# Patient Record
Sex: Female | Born: 1970 | Race: White | Hispanic: No | Smoking: Current every day smoker
Health system: Southern US, Community
[De-identification: ages and names within clinical notes are randomized; demographics above are authoritative.]

## PROBLEM LIST (undated history)

## (undated) DIAGNOSIS — M545 Low back pain, unspecified: Secondary | ICD-10-CM

## (undated) DIAGNOSIS — R87629 Unspecified abnormal cytological findings in specimens from vagina: Secondary | ICD-10-CM

## (undated) DIAGNOSIS — F32A Depression, unspecified: Secondary | ICD-10-CM

## (undated) DIAGNOSIS — M1990 Unspecified osteoarthritis, unspecified site: Secondary | ICD-10-CM

## (undated) DIAGNOSIS — E538 Deficiency of other specified B group vitamins: Secondary | ICD-10-CM

## (undated) DIAGNOSIS — K219 Gastro-esophageal reflux disease without esophagitis: Secondary | ICD-10-CM

## (undated) DIAGNOSIS — N200 Calculus of kidney: Secondary | ICD-10-CM

## (undated) DIAGNOSIS — F319 Bipolar disorder, unspecified: Secondary | ICD-10-CM

## (undated) DIAGNOSIS — F329 Major depressive disorder, single episode, unspecified: Secondary | ICD-10-CM

## (undated) DIAGNOSIS — F419 Anxiety disorder, unspecified: Secondary | ICD-10-CM

## (undated) DIAGNOSIS — I10 Essential (primary) hypertension: Secondary | ICD-10-CM

## (undated) DIAGNOSIS — N750 Cyst of Bartholin's gland: Secondary | ICD-10-CM

## (undated) DIAGNOSIS — K610 Anal abscess: Secondary | ICD-10-CM

## (undated) DIAGNOSIS — K5090 Crohn's disease, unspecified, without complications: Secondary | ICD-10-CM

## (undated) DIAGNOSIS — R7309 Other abnormal glucose: Secondary | ICD-10-CM

## (undated) HISTORY — DX: Depression, unspecified: F32.A

## (undated) HISTORY — DX: Calculus of kidney: N20.0

## (undated) HISTORY — DX: Anxiety disorder, unspecified: F41.9

## (undated) HISTORY — DX: Gastro-esophageal reflux disease without esophagitis: K21.9

## (undated) HISTORY — DX: Unspecified abnormal cytological findings in specimens from vagina: R87.629

## (undated) HISTORY — DX: Anal abscess: K61.0

## (undated) HISTORY — DX: Bipolar disorder, unspecified: F31.9

## (undated) HISTORY — DX: Unspecified osteoarthritis, unspecified site: M19.90

## (undated) HISTORY — DX: Low back pain: M54.5

## (undated) HISTORY — DX: Low back pain, unspecified: M54.50

## (undated) HISTORY — DX: Deficiency of other specified B group vitamins: E53.8

## (undated) HISTORY — DX: Major depressive disorder, single episode, unspecified: F32.9

## (undated) HISTORY — DX: Cyst of Bartholin's gland: N75.0

## (undated) HISTORY — DX: Other abnormal glucose: R73.09

## (undated) HISTORY — PX: ELBOW SURGERY: SHX618

## (undated) HISTORY — PX: RECTOVAGINAL FISTULA CLOSURE: SUR265

## (undated) HISTORY — DX: Crohn's disease, unspecified, without complications: K50.90

---

## 1998-02-19 DIAGNOSIS — K5090 Crohn's disease, unspecified, without complications: Secondary | ICD-10-CM

## 1998-02-19 HISTORY — DX: Crohn's disease, unspecified, without complications: K50.90

## 1998-07-14 ENCOUNTER — Other Ambulatory Visit: Admission: RE | Admit: 1998-07-14 | Discharge: 1998-07-14 | Payer: Self-pay | Admitting: Gastroenterology

## 1998-07-14 ENCOUNTER — Encounter: Payer: Self-pay | Admitting: Gastroenterology

## 1999-07-01 ENCOUNTER — Emergency Department (HOSPITAL_COMMUNITY): Admission: EM | Admit: 1999-07-01 | Discharge: 1999-07-01 | Payer: Self-pay | Admitting: *Deleted

## 1999-07-02 ENCOUNTER — Encounter: Payer: Self-pay | Admitting: Emergency Medicine

## 2002-03-26 ENCOUNTER — Encounter: Admission: RE | Admit: 2002-03-26 | Discharge: 2002-05-13 | Payer: Self-pay | Admitting: Occupational Medicine

## 2003-02-22 ENCOUNTER — Encounter: Payer: Self-pay | Admitting: Internal Medicine

## 2003-02-22 LAB — CONVERTED CEMR LAB

## 2003-12-02 ENCOUNTER — Ambulatory Visit: Payer: Self-pay | Admitting: Physician Assistant

## 2003-12-29 ENCOUNTER — Encounter: Payer: Self-pay | Admitting: Unknown Physician Specialty

## 2004-01-03 ENCOUNTER — Ambulatory Visit: Payer: Self-pay | Admitting: Physician Assistant

## 2004-02-21 LAB — HM COLONOSCOPY

## 2004-03-01 ENCOUNTER — Ambulatory Visit: Payer: Self-pay | Admitting: Pain Medicine

## 2004-03-06 ENCOUNTER — Ambulatory Visit: Payer: Self-pay | Admitting: Pain Medicine

## 2004-03-14 ENCOUNTER — Ambulatory Visit: Payer: Self-pay | Admitting: Physician Assistant

## 2004-05-01 ENCOUNTER — Ambulatory Visit: Payer: Self-pay | Admitting: Physician Assistant

## 2004-05-09 ENCOUNTER — Ambulatory Visit: Payer: Self-pay | Admitting: Pain Medicine

## 2004-05-29 ENCOUNTER — Ambulatory Visit: Payer: Self-pay | Admitting: Physician Assistant

## 2004-06-19 ENCOUNTER — Ambulatory Visit: Payer: Self-pay | Admitting: Physician Assistant

## 2004-07-20 ENCOUNTER — Ambulatory Visit: Payer: Self-pay | Admitting: Physician Assistant

## 2005-01-22 ENCOUNTER — Ambulatory Visit: Payer: Self-pay | Admitting: Internal Medicine

## 2005-02-20 ENCOUNTER — Ambulatory Visit: Payer: Self-pay | Admitting: Internal Medicine

## 2005-04-05 ENCOUNTER — Ambulatory Visit: Payer: Self-pay | Admitting: Internal Medicine

## 2005-05-31 ENCOUNTER — Ambulatory Visit: Payer: Self-pay | Admitting: Internal Medicine

## 2005-07-06 ENCOUNTER — Ambulatory Visit: Payer: Self-pay | Admitting: Internal Medicine

## 2005-07-23 ENCOUNTER — Ambulatory Visit: Payer: Self-pay | Admitting: Internal Medicine

## 2005-09-24 ENCOUNTER — Ambulatory Visit: Payer: Self-pay | Admitting: Internal Medicine

## 2005-11-02 ENCOUNTER — Ambulatory Visit: Payer: Self-pay | Admitting: Internal Medicine

## 2005-11-26 ENCOUNTER — Ambulatory Visit: Payer: Self-pay | Admitting: Internal Medicine

## 2006-01-28 ENCOUNTER — Ambulatory Visit: Payer: Self-pay | Admitting: Internal Medicine

## 2006-02-19 DIAGNOSIS — N750 Cyst of Bartholin's gland: Secondary | ICD-10-CM

## 2006-02-19 HISTORY — DX: Cyst of Bartholin's gland: N75.0

## 2006-03-25 ENCOUNTER — Ambulatory Visit: Payer: Self-pay | Admitting: Internal Medicine

## 2006-07-24 ENCOUNTER — Ambulatory Visit: Payer: Self-pay | Admitting: Internal Medicine

## 2006-08-08 ENCOUNTER — Ambulatory Visit: Payer: Self-pay | Admitting: Internal Medicine

## 2006-08-08 LAB — CONVERTED CEMR LAB
Basophils Absolute: 0 10*3/uL (ref 0.0–0.1)
Basophils Relative: 0.4 % (ref 0.0–1.0)
Eosinophils Absolute: 0 10*3/uL (ref 0.0–0.6)
Eosinophils Relative: 0.5 % (ref 0.0–5.0)
HCT: 41 % (ref 36.0–46.0)
Hemoglobin: 14.1 g/dL (ref 12.0–15.0)
Lymphocytes Relative: 14.3 % (ref 12.0–46.0)
MCHC: 34.4 g/dL (ref 30.0–36.0)
MCV: 89.3 fL (ref 78.0–100.0)
Monocytes Absolute: 0.3 10*3/uL (ref 0.2–0.7)
Monocytes Relative: 3.5 % (ref 3.0–11.0)
Neutro Abs: 8 10*3/uL — ABNORMAL HIGH (ref 1.4–7.7)
Neutrophils Relative %: 81.3 % — ABNORMAL HIGH (ref 43.0–77.0)
Platelets: 205 10*3/uL (ref 150–400)
RBC: 4.59 M/uL (ref 3.87–5.11)
RDW: 12.5 % (ref 11.5–14.6)
Rh Type: POSITIVE
Vit D, 1,25-Dihydroxy: 37 (ref 20–57)
Vitamin B-12: 110 pg/mL — ABNORMAL LOW (ref 211–911)
WBC: 9.7 10*3/uL (ref 4.5–10.5)

## 2006-08-28 ENCOUNTER — Ambulatory Visit: Payer: Self-pay | Admitting: Internal Medicine

## 2006-09-23 ENCOUNTER — Ambulatory Visit: Payer: Self-pay | Admitting: Internal Medicine

## 2006-09-23 ENCOUNTER — Encounter: Payer: Self-pay | Admitting: Internal Medicine

## 2006-09-23 DIAGNOSIS — M545 Low back pain, unspecified: Secondary | ICD-10-CM | POA: Insufficient documentation

## 2006-11-13 ENCOUNTER — Ambulatory Visit (HOSPITAL_COMMUNITY): Admission: RE | Admit: 2006-11-13 | Discharge: 2006-11-13 | Payer: Self-pay | Admitting: Internal Medicine

## 2006-11-18 ENCOUNTER — Ambulatory Visit: Payer: Self-pay | Admitting: Internal Medicine

## 2006-11-18 LAB — CONVERTED CEMR LAB
Rh Type: POSITIVE
Vitamin B-12: 523 pg/mL (ref 211–911)

## 2006-11-27 ENCOUNTER — Encounter: Payer: Self-pay | Admitting: Internal Medicine

## 2006-11-27 ENCOUNTER — Ambulatory Visit: Payer: Self-pay | Admitting: Internal Medicine

## 2006-11-27 DIAGNOSIS — K219 Gastro-esophageal reflux disease without esophagitis: Secondary | ICD-10-CM | POA: Insufficient documentation

## 2006-11-27 DIAGNOSIS — R599 Enlarged lymph nodes, unspecified: Secondary | ICD-10-CM | POA: Insufficient documentation

## 2006-11-27 DIAGNOSIS — F419 Anxiety disorder, unspecified: Secondary | ICD-10-CM | POA: Insufficient documentation

## 2006-12-11 ENCOUNTER — Telehealth (INDEPENDENT_AMBULATORY_CARE_PROVIDER_SITE_OTHER): Payer: Self-pay | Admitting: *Deleted

## 2006-12-19 ENCOUNTER — Encounter: Payer: Self-pay | Admitting: Internal Medicine

## 2006-12-19 ENCOUNTER — Ambulatory Visit: Payer: Self-pay | Admitting: Internal Medicine

## 2006-12-19 ENCOUNTER — Telehealth: Payer: Self-pay | Admitting: Internal Medicine

## 2006-12-23 LAB — CONVERTED CEMR LAB
Basophils Absolute: 0.3 10*3/uL — ABNORMAL HIGH (ref 0.0–0.1)
Basophils Relative: 3.2 % — ABNORMAL HIGH (ref 0.0–1.0)
Eosinophils Absolute: 0.1 10*3/uL (ref 0.0–0.6)
Eosinophils Relative: 0.6 % (ref 0.0–5.0)
HCT: 39.3 % (ref 36.0–46.0)
Hemoglobin: 13.8 g/dL (ref 12.0–15.0)
Lymphocytes Relative: 20.9 % (ref 12.0–46.0)
MCHC: 35.1 g/dL (ref 30.0–36.0)
MCV: 86.5 fL (ref 78.0–100.0)
Monocytes Absolute: 0.5 10*3/uL (ref 0.2–0.7)
Monocytes Relative: 5.2 % (ref 3.0–11.0)
Neutro Abs: 6.2 10*3/uL (ref 1.4–7.7)
Neutrophils Relative %: 70.1 % (ref 43.0–77.0)
Platelets: 185 10*3/uL (ref 150–400)
RBC: 4.54 M/uL (ref 3.87–5.11)
RDW: 12.7 % (ref 11.5–14.6)
WBC: 9 10*3/uL (ref 4.5–10.5)

## 2006-12-24 ENCOUNTER — Ambulatory Visit: Payer: Self-pay | Admitting: Gastroenterology

## 2007-01-21 ENCOUNTER — Ambulatory Visit: Payer: Self-pay | Admitting: Internal Medicine

## 2007-01-21 DIAGNOSIS — K130 Diseases of lips: Secondary | ICD-10-CM | POA: Insufficient documentation

## 2007-01-28 ENCOUNTER — Ambulatory Visit (HOSPITAL_COMMUNITY): Admission: RE | Admit: 2007-01-28 | Discharge: 2007-01-28 | Payer: Self-pay | Admitting: Gastroenterology

## 2007-01-31 ENCOUNTER — Encounter: Payer: Self-pay | Admitting: Internal Medicine

## 2007-02-04 ENCOUNTER — Ambulatory Visit: Payer: Self-pay | Admitting: Gastroenterology

## 2007-02-20 DIAGNOSIS — K610 Anal abscess: Secondary | ICD-10-CM

## 2007-02-20 HISTORY — DX: Anal abscess: K61.0

## 2007-02-27 ENCOUNTER — Encounter: Payer: Self-pay | Admitting: Internal Medicine

## 2007-03-17 ENCOUNTER — Ambulatory Visit: Payer: Self-pay | Admitting: Internal Medicine

## 2007-03-17 DIAGNOSIS — E785 Hyperlipidemia, unspecified: Secondary | ICD-10-CM | POA: Insufficient documentation

## 2007-03-17 DIAGNOSIS — K612 Anorectal abscess: Secondary | ICD-10-CM | POA: Insufficient documentation

## 2007-03-17 DIAGNOSIS — E039 Hypothyroidism, unspecified: Secondary | ICD-10-CM | POA: Insufficient documentation

## 2007-03-19 ENCOUNTER — Encounter: Payer: Self-pay | Admitting: Internal Medicine

## 2007-03-19 LAB — CONVERTED CEMR LAB
ALT: 8 units/L (ref 0–35)
AST: 9 units/L (ref 0–37)
Albumin: 3.4 g/dL — ABNORMAL LOW (ref 3.5–5.2)
Alkaline Phosphatase: 53 units/L (ref 39–117)
BUN: 13 mg/dL (ref 6–23)
Basophils Absolute: 0 10*3/uL (ref 0.0–0.1)
Basophils Relative: 0.1 % (ref 0.0–1.0)
Bilirubin Urine: NEGATIVE
Bilirubin, Direct: 0.1 mg/dL (ref 0.0–0.3)
CO2: 28 meq/L (ref 19–32)
Calcium: 8.5 mg/dL (ref 8.4–10.5)
Chloride: 106 meq/L (ref 96–112)
Cholesterol: 168 mg/dL (ref 0–200)
Creatinine, Ser: 0.8 mg/dL (ref 0.4–1.2)
Crystals: NEGATIVE
Eosinophils Absolute: 0.1 10*3/uL (ref 0.0–0.6)
Eosinophils Relative: 1.1 % (ref 0.0–5.0)
GFR calc Af Amer: 104 mL/min
GFR calc non Af Amer: 86 mL/min
Glucose, Bld: 92 mg/dL (ref 70–99)
HCT: 36.7 % (ref 36.0–46.0)
HDL: 23.3 mg/dL — ABNORMAL LOW (ref >39.0)
Hemoglobin: 12.3 g/dL (ref 12.0–15.0)
LDL Cholesterol: 115 mg/dL — ABNORMAL HIGH (ref 0–99)
Leukocytes, UA: NEGATIVE
Lymphocytes Relative: 20 % (ref 12.0–46.0)
MCHC: 33.6 g/dL (ref 30.0–36.0)
MCV: 85.7 fL (ref 78.0–100.0)
Monocytes Absolute: 0.4 10*3/uL (ref 0.2–0.7)
Monocytes Relative: 5.6 % (ref 3.0–11.0)
Mucus, UA: NEGATIVE
Neutro Abs: 5 10*3/uL (ref 1.4–7.7)
Neutrophils Relative %: 73.2 % (ref 43.0–77.0)
Nitrite: NEGATIVE
Platelets: 167 10*3/uL (ref 150–400)
Potassium: 3.1 meq/L — ABNORMAL LOW (ref 3.5–5.1)
RBC: 4.28 M/uL (ref 3.87–5.11)
RDW: 12.5 % (ref 11.5–14.6)
Sodium: 141 meq/L (ref 135–145)
Specific Gravity, Urine: >=1.03 (ref 1.000–1.03)
TSH: 1.33 microintl units/mL (ref 0.35–5.50)
Total Bilirubin: 0.5 mg/dL (ref 0.3–1.2)
Total CHOL/HDL Ratio: 7.2
Total Protein: 6.2 g/dL (ref 6.0–8.3)
Triglycerides: 151 mg/dL — ABNORMAL HIGH (ref 0–149)
Urine Glucose: NEGATIVE mg/dL
Urobilinogen, UA: 0.2 (ref 0.0–1.0)
VLDL: 30 mg/dL (ref 0–40)
Vit D, 1,25-Dihydroxy: 28 — ABNORMAL LOW (ref 30–89)
Vitamin B-12: 425 pg/mL (ref 211–911)
WBC: 6.9 10*3/uL (ref 4.5–10.5)
pH: 6 (ref 5.0–8.0)

## 2007-03-27 ENCOUNTER — Ambulatory Visit: Payer: Self-pay | Admitting: Internal Medicine

## 2007-03-28 ENCOUNTER — Encounter: Payer: Self-pay | Admitting: Internal Medicine

## 2007-04-03 ENCOUNTER — Other Ambulatory Visit: Admission: RE | Admit: 2007-04-03 | Discharge: 2007-04-03 | Payer: Self-pay | Admitting: Obstetrics & Gynecology

## 2007-04-14 DIAGNOSIS — N1330 Unspecified hydronephrosis: Secondary | ICD-10-CM | POA: Insufficient documentation

## 2007-04-14 DIAGNOSIS — F17200 Nicotine dependence, unspecified, uncomplicated: Secondary | ICD-10-CM | POA: Insufficient documentation

## 2007-04-14 DIAGNOSIS — K5090 Crohn's disease, unspecified, without complications: Secondary | ICD-10-CM | POA: Insufficient documentation

## 2007-04-14 DIAGNOSIS — E538 Deficiency of other specified B group vitamins: Secondary | ICD-10-CM | POA: Insufficient documentation

## 2007-04-14 DIAGNOSIS — N200 Calculus of kidney: Secondary | ICD-10-CM | POA: Insufficient documentation

## 2007-04-14 DIAGNOSIS — K449 Diaphragmatic hernia without obstruction or gangrene: Secondary | ICD-10-CM | POA: Insufficient documentation

## 2007-04-14 DIAGNOSIS — K2990 Gastroduodenitis, unspecified, without bleeding: Secondary | ICD-10-CM

## 2007-04-14 DIAGNOSIS — F329 Major depressive disorder, single episode, unspecified: Secondary | ICD-10-CM | POA: Insufficient documentation

## 2007-04-14 DIAGNOSIS — G5600 Carpal tunnel syndrome, unspecified upper limb: Secondary | ICD-10-CM | POA: Insufficient documentation

## 2007-04-14 DIAGNOSIS — G43909 Migraine, unspecified, not intractable, without status migrainosus: Secondary | ICD-10-CM | POA: Insufficient documentation

## 2007-04-14 DIAGNOSIS — D126 Benign neoplasm of colon, unspecified: Secondary | ICD-10-CM | POA: Insufficient documentation

## 2007-04-14 DIAGNOSIS — M129 Arthropathy, unspecified: Secondary | ICD-10-CM | POA: Insufficient documentation

## 2007-04-14 DIAGNOSIS — F319 Bipolar disorder, unspecified: Secondary | ICD-10-CM

## 2007-04-14 DIAGNOSIS — R5381 Other malaise: Secondary | ICD-10-CM

## 2007-04-14 DIAGNOSIS — M2550 Pain in unspecified joint: Secondary | ICD-10-CM | POA: Insufficient documentation

## 2007-04-14 DIAGNOSIS — J40 Bronchitis, not specified as acute or chronic: Secondary | ICD-10-CM | POA: Insufficient documentation

## 2007-04-14 DIAGNOSIS — R5382 Chronic fatigue, unspecified: Secondary | ICD-10-CM | POA: Insufficient documentation

## 2007-04-14 DIAGNOSIS — K2210 Ulcer of esophagus without bleeding: Secondary | ICD-10-CM | POA: Insufficient documentation

## 2007-04-14 DIAGNOSIS — R5383 Other fatigue: Secondary | ICD-10-CM

## 2007-04-14 DIAGNOSIS — F909 Attention-deficit hyperactivity disorder, unspecified type: Secondary | ICD-10-CM | POA: Insufficient documentation

## 2007-04-14 DIAGNOSIS — F32A Depression, unspecified: Secondary | ICD-10-CM | POA: Insufficient documentation

## 2007-04-14 DIAGNOSIS — K2970 Gastritis, unspecified, without bleeding: Secondary | ICD-10-CM | POA: Insufficient documentation

## 2007-04-14 HISTORY — DX: Bipolar disorder, unspecified: F31.9

## 2007-04-15 ENCOUNTER — Encounter: Payer: Self-pay | Admitting: Gastroenterology

## 2007-04-18 ENCOUNTER — Encounter: Payer: Self-pay | Admitting: Internal Medicine

## 2007-05-02 ENCOUNTER — Ambulatory Visit: Payer: Self-pay | Admitting: Internal Medicine

## 2007-05-05 LAB — CONVERTED CEMR LAB
ALT: 10 units/L (ref 0–35)
AST: 13 units/L (ref 0–37)
Albumin: 3.8 g/dL (ref 3.5–5.2)
Alkaline Phosphatase: 88 units/L (ref 39–117)
BUN: 13 mg/dL (ref 6–23)
Basophils Absolute: 0.1 10*3/uL (ref 0.0–0.1)
Basophils Relative: 0.5 % (ref 0.0–1.0)
Bilirubin Urine: NEGATIVE
Bilirubin, Direct: 0.1 mg/dL (ref 0.0–0.3)
CO2: 28 meq/L (ref 19–32)
Calcium: 9.4 mg/dL (ref 8.4–10.5)
Chloride: 107 meq/L (ref 96–112)
Creatinine, Ser: 0.9 mg/dL (ref 0.4–1.2)
Eosinophils Absolute: 0.3 10*3/uL (ref 0.0–0.6)
Eosinophils Relative: 2.8 % (ref 0.0–5.0)
GFR calc Af Amer: 91 mL/min
GFR calc non Af Amer: 75 mL/min
Glucose, Bld: 102 mg/dL — ABNORMAL HIGH (ref 70–99)
HCT: 38.2 % (ref 36.0–46.0)
Hemoglobin: 13.1 g/dL (ref 12.0–15.0)
Ketones, ur: NEGATIVE mg/dL
Lymphocytes Relative: 19.7 % (ref 12.0–46.0)
MCHC: 34.2 g/dL (ref 30.0–36.0)
MCV: 83.5 fL (ref 78.0–100.0)
Monocytes Absolute: 0.6 10*3/uL (ref 0.2–0.7)
Monocytes Relative: 6 % (ref 3.0–11.0)
Neutro Abs: 7.4 10*3/uL (ref 1.4–7.7)
Neutrophils Relative %: 71 % (ref 43.0–77.0)
Nitrite: NEGATIVE
Platelets: 220 10*3/uL (ref 150–400)
Potassium: 3.5 meq/L (ref 3.5–5.1)
RBC / HPF: NONE SEEN
RBC: 4.58 M/uL (ref 3.87–5.11)
RDW: 12.5 % (ref 11.5–14.6)
Sodium: 141 meq/L (ref 135–145)
Specific Gravity, Urine: >=1.03 (ref 1.000–1.03)
Total Bilirubin: 0.4 mg/dL (ref 0.3–1.2)
Total Protein, Urine: NEGATIVE mg/dL
Total Protein: 6.7 g/dL (ref 6.0–8.3)
Urine Glucose: NEGATIVE mg/dL
Urobilinogen, UA: 0.2 (ref 0.0–1.0)
WBC: 10.4 10*3/uL (ref 4.5–10.5)
pH: 5.5 (ref 5.0–8.0)

## 2007-05-12 ENCOUNTER — Ambulatory Visit: Payer: Self-pay | Admitting: Internal Medicine

## 2007-05-12 DIAGNOSIS — N824 Other female intestinal-genital tract fistulae: Secondary | ICD-10-CM | POA: Insufficient documentation

## 2007-05-12 DIAGNOSIS — N760 Acute vaginitis: Secondary | ICD-10-CM | POA: Insufficient documentation

## 2007-05-26 ENCOUNTER — Telehealth: Payer: Self-pay | Admitting: Internal Medicine

## 2007-07-23 ENCOUNTER — Ambulatory Visit: Payer: Self-pay | Admitting: Internal Medicine

## 2007-07-23 DIAGNOSIS — L02419 Cutaneous abscess of limb, unspecified: Secondary | ICD-10-CM | POA: Insufficient documentation

## 2007-07-23 DIAGNOSIS — R21 Rash and other nonspecific skin eruption: Secondary | ICD-10-CM | POA: Insufficient documentation

## 2007-07-23 DIAGNOSIS — L03119 Cellulitis of unspecified part of limb: Secondary | ICD-10-CM

## 2007-07-25 ENCOUNTER — Telehealth: Payer: Self-pay | Admitting: Internal Medicine

## 2007-08-12 ENCOUNTER — Encounter: Payer: Self-pay | Admitting: Internal Medicine

## 2007-09-30 ENCOUNTER — Ambulatory Visit: Payer: Self-pay | Admitting: Internal Medicine

## 2007-09-30 DIAGNOSIS — L659 Nonscarring hair loss, unspecified: Secondary | ICD-10-CM | POA: Insufficient documentation

## 2007-10-11 ENCOUNTER — Encounter: Payer: Self-pay | Admitting: Internal Medicine

## 2007-10-15 ENCOUNTER — Telehealth: Payer: Self-pay | Admitting: Internal Medicine

## 2007-10-19 ENCOUNTER — Encounter: Payer: Self-pay | Admitting: Internal Medicine

## 2007-10-24 ENCOUNTER — Telehealth: Payer: Self-pay | Admitting: Internal Medicine

## 2007-11-06 ENCOUNTER — Encounter: Payer: Self-pay | Admitting: Internal Medicine

## 2007-11-25 ENCOUNTER — Telehealth: Payer: Self-pay | Admitting: Internal Medicine

## 2007-11-27 ENCOUNTER — Telehealth: Payer: Self-pay | Admitting: Internal Medicine

## 2007-12-04 ENCOUNTER — Ambulatory Visit: Payer: Self-pay | Admitting: Internal Medicine

## 2007-12-11 ENCOUNTER — Encounter: Payer: Self-pay | Admitting: Internal Medicine

## 2008-01-20 ENCOUNTER — Ambulatory Visit: Payer: Self-pay | Admitting: Internal Medicine

## 2008-02-11 ENCOUNTER — Telehealth (INDEPENDENT_AMBULATORY_CARE_PROVIDER_SITE_OTHER): Payer: Self-pay | Admitting: *Deleted

## 2008-02-19 ENCOUNTER — Encounter: Payer: Self-pay | Admitting: Internal Medicine

## 2008-02-20 DIAGNOSIS — R7309 Other abnormal glucose: Secondary | ICD-10-CM

## 2008-02-20 HISTORY — DX: Other abnormal glucose: R73.09

## 2008-04-20 ENCOUNTER — Ambulatory Visit: Payer: Self-pay | Admitting: Internal Medicine

## 2008-06-08 ENCOUNTER — Other Ambulatory Visit: Admission: RE | Admit: 2008-06-08 | Discharge: 2008-06-08 | Payer: Self-pay | Admitting: Obstetrics and Gynecology

## 2008-06-08 ENCOUNTER — Encounter: Payer: Self-pay | Admitting: Internal Medicine

## 2008-06-15 ENCOUNTER — Telehealth: Payer: Self-pay | Admitting: Internal Medicine

## 2008-06-16 ENCOUNTER — Ambulatory Visit: Payer: Self-pay | Admitting: Internal Medicine

## 2008-06-16 ENCOUNTER — Encounter (INDEPENDENT_AMBULATORY_CARE_PROVIDER_SITE_OTHER): Payer: Self-pay | Admitting: *Deleted

## 2008-06-17 LAB — CONVERTED CEMR LAB
ALT: 8 units/L (ref 0–35)
AST: 15 units/L (ref 0–37)
Albumin: 3.6 g/dL (ref 3.5–5.2)
Alkaline Phosphatase: 69 units/L (ref 39–117)
BUN: 16 mg/dL (ref 6–23)
Basophils Absolute: 0 10*3/uL (ref 0.0–0.1)
Basophils Relative: 0.3 % (ref 0.0–3.0)
Bilirubin, Direct: 0.1 mg/dL (ref 0.0–0.3)
CO2: 25 meq/L (ref 19–32)
Calcium: 8.7 mg/dL (ref 8.4–10.5)
Chloride: 110 meq/L (ref 96–112)
Creatinine, Ser: 0.9 mg/dL (ref 0.4–1.2)
Eosinophils Absolute: 0.1 10*3/uL (ref 0.0–0.7)
Eosinophils Relative: 1.2 % (ref 0.0–5.0)
GFR calc non Af Amer: 74.27 mL/min (ref >60)
Glucose, Bld: 141 mg/dL — ABNORMAL HIGH (ref 70–99)
HCT: 38.8 % (ref 36.0–46.0)
Hemoglobin: 13.5 g/dL (ref 12.0–15.0)
Lymphocytes Relative: 20.2 % (ref 12.0–46.0)
Lymphs Abs: 1.6 10*3/uL (ref 0.7–4.0)
MCHC: 34.8 g/dL (ref 30.0–36.0)
MCV: 84.2 fL (ref 78.0–100.0)
Monocytes Absolute: 0.4 10*3/uL (ref 0.1–1.0)
Monocytes Relative: 5.2 % (ref 3.0–12.0)
Neutro Abs: 6 10*3/uL (ref 1.4–7.7)
Neutrophils Relative %: 73.1 % (ref 43.0–77.0)
Platelets: 185 10*3/uL (ref 150.0–400.0)
Potassium: 3.3 meq/L — ABNORMAL LOW (ref 3.5–5.1)
RBC: 4.61 M/uL (ref 3.87–5.11)
RDW: 13.9 % (ref 11.5–14.6)
Sed Rate: 30 mm/hr — ABNORMAL HIGH (ref 0–22)
Sodium: 142 meq/L (ref 135–145)
TSH: 1.31 microintl units/mL (ref 0.35–5.50)
Total Bilirubin: 0.5 mg/dL (ref 0.3–1.2)
Total Protein: 6.7 g/dL (ref 6.0–8.3)
Vitamin B-12: 375 pg/mL (ref 211–911)
WBC: 8.1 10*3/uL (ref 4.5–10.5)

## 2008-07-09 ENCOUNTER — Encounter: Payer: Self-pay | Admitting: Internal Medicine

## 2008-08-24 ENCOUNTER — Ambulatory Visit: Payer: Self-pay | Admitting: Internal Medicine

## 2008-08-25 ENCOUNTER — Encounter: Payer: Self-pay | Admitting: Internal Medicine

## 2008-08-27 ENCOUNTER — Telehealth: Payer: Self-pay | Admitting: Internal Medicine

## 2008-09-19 HISTORY — PX: COLONOSCOPY: SHX174

## 2008-11-25 ENCOUNTER — Ambulatory Visit: Payer: Self-pay | Admitting: Internal Medicine

## 2008-11-25 ENCOUNTER — Telehealth: Payer: Self-pay | Admitting: Internal Medicine

## 2009-01-17 ENCOUNTER — Ambulatory Visit: Payer: Self-pay | Admitting: Internal Medicine

## 2009-01-17 LAB — CONVERTED CEMR LAB
ALT: 10 units/L (ref 0–35)
AST: 10 units/L (ref 0–37)
Albumin: 3.7 g/dL (ref 3.5–5.2)
Alkaline Phosphatase: 58 units/L (ref 39–117)
BUN: 20 mg/dL (ref 6–23)
Bilirubin, Direct: 0.1 mg/dL (ref 0.0–0.3)
CO2: 25 meq/L (ref 19–32)
Calcium: 8.8 mg/dL (ref 8.4–10.5)
Chloride: 106 meq/L (ref 96–112)
Creatinine, Ser: 0.9 mg/dL (ref 0.4–1.2)
GFR calc non Af Amer: 74.04 mL/min (ref >60)
Glucose, Bld: 127 mg/dL — ABNORMAL HIGH (ref 70–99)
Potassium: 3.6 meq/L (ref 3.5–5.1)
Sed Rate: 46 mm/hr — ABNORMAL HIGH (ref 0–22)
Sodium: 139 meq/L (ref 135–145)
TSH: 1.68 microintl units/mL (ref 0.35–5.50)
Total Bilirubin: 0.5 mg/dL (ref 0.3–1.2)
Total Protein: 6.5 g/dL (ref 6.0–8.3)
Vitamin B-12: 606 pg/mL (ref 211–911)

## 2009-01-24 ENCOUNTER — Telehealth: Payer: Self-pay | Admitting: Internal Medicine

## 2009-01-24 ENCOUNTER — Ambulatory Visit: Payer: Self-pay | Admitting: Internal Medicine

## 2009-01-24 DIAGNOSIS — R7309 Other abnormal glucose: Secondary | ICD-10-CM | POA: Insufficient documentation

## 2009-03-24 ENCOUNTER — Telehealth: Payer: Self-pay | Admitting: Internal Medicine

## 2009-03-24 ENCOUNTER — Ambulatory Visit: Payer: Self-pay | Admitting: Internal Medicine

## 2009-05-17 ENCOUNTER — Ambulatory Visit: Payer: Self-pay | Admitting: Internal Medicine

## 2009-05-26 ENCOUNTER — Encounter: Payer: Self-pay | Admitting: Internal Medicine

## 2009-06-29 ENCOUNTER — Other Ambulatory Visit: Admission: RE | Admit: 2009-06-29 | Discharge: 2009-06-29 | Payer: Self-pay | Admitting: Obstetrics and Gynecology

## 2009-07-13 ENCOUNTER — Ambulatory Visit: Payer: Self-pay | Admitting: Internal Medicine

## 2009-07-13 ENCOUNTER — Telehealth: Payer: Self-pay | Admitting: Internal Medicine

## 2009-07-13 DIAGNOSIS — R635 Abnormal weight gain: Secondary | ICD-10-CM | POA: Insufficient documentation

## 2009-07-13 DIAGNOSIS — N3000 Acute cystitis without hematuria: Secondary | ICD-10-CM | POA: Insufficient documentation

## 2009-07-14 LAB — CONVERTED CEMR LAB
ALT: 10 units/L (ref 0–35)
AST: 13 units/L (ref 0–37)
Albumin: 4.1 g/dL (ref 3.5–5.2)
Alkaline Phosphatase: 84 units/L (ref 39–117)
BUN: 24 mg/dL — ABNORMAL HIGH (ref 6–23)
Basophils Absolute: 0 10*3/uL (ref 0.0–0.1)
Basophils Relative: 0.3 % (ref 0.0–3.0)
Bilirubin, Direct: 0.1 mg/dL (ref 0.0–0.3)
CO2: 26 meq/L (ref 19–32)
Calcium: 9.2 mg/dL (ref 8.4–10.5)
Chloride: 108 meq/L (ref 96–112)
Creatinine, Ser: 1.1 mg/dL (ref 0.4–1.2)
Eosinophils Absolute: 0.1 10*3/uL (ref 0.0–0.7)
Eosinophils Relative: 0.7 % (ref 0.0–5.0)
GFR calc non Af Amer: 61.82 mL/min (ref >60)
Glucose, Bld: 85 mg/dL (ref 70–99)
HCT: 35.3 % — ABNORMAL LOW (ref 36.0–46.0)
Hemoglobin: 12 g/dL (ref 12.0–15.0)
Ketones, ur: NEGATIVE mg/dL
Lymphocytes Relative: 18.8 % (ref 12.0–46.0)
Lymphs Abs: 1.6 10*3/uL (ref 0.7–4.0)
MCHC: 34.2 g/dL (ref 30.0–36.0)
MCV: 79.6 fL (ref 78.0–100.0)
Monocytes Absolute: 0.4 10*3/uL (ref 0.1–1.0)
Monocytes Relative: 5.1 % (ref 3.0–12.0)
Neutro Abs: 6.5 10*3/uL (ref 1.4–7.7)
Neutrophils Relative %: 75.1 % (ref 43.0–77.0)
Nitrite: NEGATIVE
Platelets: 193 10*3/uL (ref 150.0–400.0)
Potassium: 3.9 meq/L (ref 3.5–5.1)
RBC: 4.43 M/uL (ref 3.87–5.11)
RDW: 17 % — ABNORMAL HIGH (ref 11.5–14.6)
Sed Rate: 43 mm/hr — ABNORMAL HIGH (ref 0–22)
Sodium: 141 meq/L (ref 135–145)
Specific Gravity, Urine: >=1.03 (ref 1.000–1.030)
TSH: 0.96 microintl units/mL (ref 0.35–5.50)
Total Bilirubin: 0.4 mg/dL (ref 0.3–1.2)
Total Protein, Urine: 30 mg/dL
Total Protein: 6.8 g/dL (ref 6.0–8.3)
Urine Glucose: NEGATIVE mg/dL
Urobilinogen, UA: 0.2 (ref 0.0–1.0)
Vitamin B-12: 258 pg/mL (ref 211–911)
WBC: 8.7 10*3/uL (ref 4.5–10.5)
pH: 5.5 (ref 5.0–8.0)

## 2009-07-15 LAB — CONVERTED CEMR LAB: Vit D, 25-Hydroxy: 28 ng/mL — ABNORMAL LOW (ref 30–89)

## 2009-09-13 ENCOUNTER — Ambulatory Visit: Payer: Self-pay | Admitting: Internal Medicine

## 2009-09-13 LAB — CONVERTED CEMR LAB
Basophils Absolute: 0 10*3/uL (ref 0.0–0.1)
Basophils Relative: 0.2 % (ref 0.0–3.0)
Eosinophils Absolute: 0.1 10*3/uL (ref 0.0–0.7)
Eosinophils Relative: 1.7 % (ref 0.0–5.0)
HCT: 35 % — ABNORMAL LOW (ref 36.0–46.0)
Hemoglobin: 12 g/dL (ref 12.0–15.0)
Lymphocytes Relative: 24.6 % (ref 12.0–46.0)
Lymphs Abs: 1.6 10*3/uL (ref 0.7–4.0)
MCHC: 34.3 g/dL (ref 30.0–36.0)
MCV: 80.6 fL (ref 78.0–100.0)
Monocytes Absolute: 0.4 10*3/uL (ref 0.1–1.0)
Monocytes Relative: 5.6 % (ref 3.0–12.0)
Neutro Abs: 4.4 10*3/uL (ref 1.4–7.7)
Neutrophils Relative %: 67.9 % (ref 43.0–77.0)
Platelets: 164 10*3/uL (ref 150.0–400.0)
RBC: 4.35 M/uL (ref 3.87–5.11)
RDW: 15.9 % — ABNORMAL HIGH (ref 11.5–14.6)
Vit D, 25-Hydroxy: 35 ng/mL (ref 30–89)
Vitamin B-12: 422 pg/mL (ref 211–911)
WBC: 6.5 10*3/uL (ref 4.5–10.5)

## 2009-10-27 ENCOUNTER — Encounter: Payer: Self-pay | Admitting: Internal Medicine

## 2009-11-08 ENCOUNTER — Telehealth: Payer: Self-pay | Admitting: Internal Medicine

## 2009-11-15 ENCOUNTER — Ambulatory Visit: Payer: Self-pay | Admitting: Internal Medicine

## 2009-11-15 DIAGNOSIS — E669 Obesity, unspecified: Secondary | ICD-10-CM | POA: Insufficient documentation

## 2009-11-15 DIAGNOSIS — R404 Transient alteration of awareness: Secondary | ICD-10-CM | POA: Insufficient documentation

## 2009-11-18 ENCOUNTER — Telehealth: Payer: Self-pay | Admitting: Internal Medicine

## 2009-11-21 ENCOUNTER — Telehealth (INDEPENDENT_AMBULATORY_CARE_PROVIDER_SITE_OTHER): Payer: Self-pay | Admitting: *Deleted

## 2009-11-29 ENCOUNTER — Encounter: Payer: Self-pay | Admitting: Internal Medicine

## 2009-12-01 ENCOUNTER — Telehealth: Payer: Self-pay | Admitting: Internal Medicine

## 2009-12-29 ENCOUNTER — Telehealth: Payer: Self-pay | Admitting: Internal Medicine

## 2010-01-09 ENCOUNTER — Ambulatory Visit: Payer: Self-pay | Admitting: Internal Medicine

## 2010-01-10 ENCOUNTER — Telehealth: Payer: Self-pay | Admitting: Internal Medicine

## 2010-01-15 ENCOUNTER — Encounter: Payer: Self-pay | Admitting: Internal Medicine

## 2010-01-16 ENCOUNTER — Ambulatory Visit: Payer: Self-pay | Admitting: Internal Medicine

## 2010-01-16 DIAGNOSIS — R609 Edema, unspecified: Secondary | ICD-10-CM | POA: Insufficient documentation

## 2010-01-16 DIAGNOSIS — N390 Urinary tract infection, site not specified: Secondary | ICD-10-CM | POA: Insufficient documentation

## 2010-03-08 ENCOUNTER — Ambulatory Visit
Admission: RE | Admit: 2010-03-08 | Discharge: 2010-03-08 | Payer: Self-pay | Source: Home / Self Care | Attending: Internal Medicine | Admitting: Internal Medicine

## 2010-03-08 ENCOUNTER — Other Ambulatory Visit: Payer: Self-pay | Admitting: Internal Medicine

## 2010-03-08 LAB — BASIC METABOLIC PANEL
BUN: 19 mg/dL (ref 6–23)
CO2: 26 mEq/L (ref 19–32)
Calcium: 9.5 mg/dL (ref 8.4–10.5)
Chloride: 106 mEq/L (ref 96–112)
Creatinine, Ser: 1.1 mg/dL (ref 0.4–1.2)
GFR: 56.03 mL/min — ABNORMAL LOW (ref >60.00)
Glucose, Bld: 87 mg/dL (ref 70–99)
Potassium: 4.6 mEq/L (ref 3.5–5.1)
Sodium: 139 mEq/L (ref 135–145)

## 2010-03-08 LAB — CBC WITH DIFFERENTIAL/PLATELET
Basophils Absolute: 0.1 10*3/uL (ref 0.0–0.1)
Basophils Relative: 1.1 % (ref 0.0–3.0)
Eosinophils Absolute: 0.1 10*3/uL (ref 0.0–0.7)
Eosinophils Relative: 0.8 % (ref 0.0–5.0)
HCT: 40 % (ref 36.0–46.0)
Hemoglobin: 13.8 g/dL (ref 12.0–15.0)
Lymphocytes Relative: 18.7 % (ref 12.0–46.0)
Lymphs Abs: 1.7 10*3/uL (ref 0.7–4.0)
MCHC: 34.4 g/dL (ref 30.0–36.0)
MCV: 81.6 fl (ref 78.0–100.0)
Monocytes Absolute: 0.5 10*3/uL (ref 0.1–1.0)
Monocytes Relative: 5.1 % (ref 3.0–12.0)
Neutro Abs: 6.7 10*3/uL (ref 1.4–7.7)
Neutrophils Relative %: 74.3 % (ref 43.0–77.0)
Platelets: 195 10*3/uL (ref 150.0–400.0)
RBC: 4.91 Mil/uL (ref 3.87–5.11)
RDW: 14.9 % — ABNORMAL HIGH (ref 11.5–14.6)
WBC: 9 10*3/uL (ref 4.5–10.5)

## 2010-03-08 LAB — URINALYSIS, ROUTINE W REFLEX MICROSCOPIC
Bilirubin Urine: NEGATIVE
Ketones, ur: NEGATIVE
Nitrite: NEGATIVE
Specific Gravity, Urine: >=1.03 (ref 1.000–1.030)
Total Protein, Urine: NEGATIVE
Urine Glucose: NEGATIVE
Urobilinogen, UA: 0.2 (ref 0.0–1.0)
pH: 5.5 (ref 5.0–8.0)

## 2010-03-08 LAB — TSH: TSH: 1.16 u[IU]/mL (ref 0.35–5.50)

## 2010-03-08 LAB — HEPATIC FUNCTION PANEL
ALT: 14 U/L (ref 0–35)
AST: 15 U/L (ref 0–37)
Albumin: 4.5 g/dL (ref 3.5–5.2)
Alkaline Phosphatase: 75 U/L (ref 39–117)
Bilirubin, Direct: 0.1 mg/dL (ref 0.0–0.3)
Total Bilirubin: 0.3 mg/dL (ref 0.3–1.2)
Total Protein: 7.2 g/dL (ref 6.0–8.3)

## 2010-03-08 LAB — SEDIMENTATION RATE: Sed Rate: 17 mm/hr (ref 0–22)

## 2010-03-08 LAB — VITAMIN B12: Vitamin B-12: 467 pg/mL (ref 211–911)

## 2010-03-21 NOTE — Progress Notes (Signed)
Summary: TSH LAB ORDER  ---- Converted from flag ---- ---- 11/21/2009 7:36 AM, Georgina Quint Plotnikov MD wrote: ok thx 780.79 Thx  ---- 11/15/2009 9:32 AM, Hilarie Fredrickson wrote: Laureen Hackley WANTS TO ADD A TSH TO THE LABS SHE IS DOING IN NOV.  OK TO SCHED? ------------------------------

## 2010-03-21 NOTE — Assessment & Plan Note (Signed)
Summary: 2 mth fu--stc   Vital Signs:  Patient profile:   40 year old female Height:      62 inches Weight:      165 pounds BMI:     30.29 Temp:     97.4 degrees F oral Pulse rate:   64 / minute Pulse rhythm:   regular Resp:     16 per minute BP sitting:   120 / 80  (left arm) Cuff size:   regular  Vitals Entered By: Lanier Prude, Beverly Gust) (November 15, 2009 8:49 AM) CC: 2 mo f/u Is Patient Diabetic? No   Primary Care Provider:  Plotnikov  CC:  2 mo f/u.  History of Present Illness: The patient presents for a follow up of back pain, anxiety, depression and colitis. She passed 3 kidney stones 2 months ago. C/o no energy, fatigue. C/o falling asleep in day time.  Preventive Screening-Counseling & Management  Alcohol-Tobacco     Smoking Status: current     Smoking Cessation Counseling: yes     Packs/Day: 1/2  Current Medications (verified): 1)  Hydrocodone-Acetaminophen 10-650 Mg Tabs (Hydrocodone-Acetaminophen) .[name].. 1 By Mouth Qid As Needed 2)  Norethindrone Acetate 5 Mg  Tabs (Norethindrone Acetate) .... Once Daily 3)  Xanax 0.5 Mg  Tabs (Alprazolam) .... At Bedtime 4)  Cobal-1000 1000 Mcg/ml Inj Soln (Cyanocobalamin) .[name].. 1 Cc Q 1 Mo Sq 5)  Vitamin D 2000 Unit  Tabs (Cholecalciferol) .... Once Daily 6)  Klor-Con M20 20 Meq  Tbcr (Potassium Chloride Crys Cr) .... Once Daily 7)  Bd Luer-Lok Syringe 25g X 5/8" 3 Ml Misc (Syringe/needle (Disp)) .... As Directed 8)  Zoloft 100 Mg Tabs (Sertraline Hcl) .... Two Times A Day 9)  Promethazine Hcl 25 Mg  Tabs (Promethazine Hcl) .[name].. 1 By Mouth Two Times A Day As Needed Nausea 10)  Skelaxin 800 Mg Tabs (Metaxalone) .[name].. 1 By Mouth Two Times A Day As Needed Back Spasm 11)  Nexium 40 Mg Cpdr (Esomeprazole Magnesium) .[name].. 1 By Mouth Qam ( Medically Necessary) 12)  Meperidine Hcl 50 Mg Tabs (Meperidine Hcl) .[name].. 1 By Mouth Once Daily As Needed Severe Pain 13)  Ketorolac Tromethamine 10 Mg Tabs (Ketorolac Tromethamine) .[name].. 1 By  Mouth Two Times A Day As Needed For Severe Pain 14)  Ciprofloxacin Hcl 500 Mg Tabs (Ciprofloxacin Hcl) .[name].. 1 By Mouth Bid 15)  Betamethasone Dipropionate Aug 0.05 % Oint (Betamethasone Dipropionate Aug) .... Use Two Times A Day Prn 16)  Cyclobenzaprine Hcl 10 Mg Tabs (Cyclobenzaprine Hcl) .... 1/2-1 Tab By Mouth Two Times A Day As Needed Muscle Spasms  Allergies (verified): 1)  ! Morphine 2)  ! Keflex 3)  ! Prilosec 4)  ! Sulfa 5)  ! Codeine 6)  ! * Guaifenesin 7)  ! Cimzia (Certolizumab Pegol)  Past History:  Past Surgical History: Last updated: 04/14/2007 left elbow surgery  Social History: Last updated: 12/19/2006 Married Current Smoker Alcohol use-no Regular exercise-no  Past Medical History: Low back pain  Dr Regino Schultze GERD Anxiety Crohn's Dr Hardie Pulley. vag. 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  The patient denies fever and dyspnea on exertion.         LBP  Physical Exam  General:  NAD, overweight-appearing.   Head:  Normocephalic and  atraumatic without obvious abnormalities. No apparent alopecia or balding. Nose:  External nasal examination shows no deformity or inflammation. Nasal mucosa are pink and moist without lesions or exudates. Mouth:  Oral mucosa and oropharynx without lesions or exudates.  Teeth in good repair. Neck:  No deformities, masses, or tenderness noted. Lungs:  Normal respiratory effort, chest expands symmetrically. Lungs are clear to auscultation, no crackles or wheezes. Heart:  Normal rate and regular rhythm. S1 and S2 normal without gallop, murmur, click, rub or other extra sounds. Abdomen:  Obese, NT Msk:  No deformity or scoliosis noted of thoracic or lumbar spine.  Lumbar-sacral spine is tender to palpation over paraspinal muscles  and painfull with the ROM  Skin:  Intact without suspicious lesions or rashes Psych:  Oriented X3, normally interactive, good eye contact, and depressed affect.     Impression & Recommendations:  Problem # 1:  CROHN'S DISEASE (ICD-555.9) Assessment Unchanged F/u with GI  Problem # 2:  B12 DEFICIENCY (ICD-266.2) Assessment: Comment Only On the regimen of medicine(s) reflected in the chart    Problem # 3:  DEPRESSION (ICD-311) Assessment: Unchanged  Her updated medication list for this problem includes:    Xanax 0.5 Mg Tabs (Alprazolam) .[name]... 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 1 by mouth qid as needed 2)  Norethindrone Acetate 5 Mg Tabs (Norethindrone acetate) .... Once daily 3)  Xanax 0.5 Mg Tabs (Alprazolam) .... At bedtime 4)  Cobal-1000 1000 Mcg/ml Inj Soln (Cyanocobalamin) .[name].. 1 cc q 1 mo sq 5)  Vitamin D 2000 Unit Tabs (Cholecalciferol) .... Once daily 6)  Klor-con M20 20 Meq Tbcr (Potassium chloride crys cr) .... Once daily 7)  Zoloft 100 Mg Tabs (Sertraline hcl) .... Two times a day 8)  Promethazine Hcl 25 Mg Tabs (Promethazine hcl) .[name].. 1 by mouth two times a day as needed nausea 9)  Nexium 40 Mg Cpdr (Esomeprazole magnesium) .[name].. 1 by mouth qam ( medically necessary) 10)  Meperidine Hcl 50 Mg Tabs (Meperidine hcl) .[name].. 1 by mouth once daily as needed severe pain 11)  Ketorolac Tromethamine 10 Mg Tabs (Ketorolac tromethamine) .[name].. 1 by mouth two times a day as needed for severe pain 12)  Ciprofloxacin Hcl 500 Mg Tabs (Ciprofloxacin hcl) .[name].. 1 by mouth bid 13)   Betamethasone Dipropionate Aug 0.05 % Oint (Betamethasone dipropionate aug) .... Use two times a day prn 14)  Cyclobenzaprine Hcl 10 Mg Tabs (Cyclobenzaprine hcl) .... 1/2-1 tab by mouth two times a day as needed muscle spasms 15)  Bd Luer-lok Syringe 25g X 5/8" 3 Ml Misc (Syringe/needle (disp)) .... As directed 16)  Nuvigil 150 Mg Tabs (Armodafinil) .[name].. 1 by mouth qam for being more alert  Other Orders: Admin 1st Vaccine (40981) Flu Vaccine 65yrs + (19147)  Patient Instructions: 1)  Please schedule a follow-up appointment in 2 months. 2)  BMP prior to visit, ICD-9: 3)  Hepatic Panel prior to visit, ICD-9: 4)  CBC w/ Diff prior to visit, ICD-9: 5)  Vit B12 266.20 995.20 Prescriptions: HYDROCODONE-ACETAMINOPHEN 10-650 MG TABS (HYDROCODONE-ACETAMINOPHEN) 1 by mouth qid as needed  #120 x 1   Entered and Authorized by:  Tresa Garter MD   Signed by:   Tresa Garter MD on 11/15/2009   Method used:   Print then Give to Patient   RxID:   [id number] NUVIGIL 150 MG TABS (ARMODAFINIL) 1 by mouth qam for being more alert  #30 x 6   Entered and Authorized by:   Tresa Garter MD   Signed by:   Tresa Garter MD on 11/15/2009   Method used:   Print then Give to Patient   RxID:   [id number]   .lbflu   Flu Vaccine Consent Questions     Do you have a history of severe allergic reactions to this vaccine? no    Any prior history of allergic reactions to egg and/or gelatin? no    Do you have a sensitivity to the preservative Thimersol? no    Do you have a past history of Guillan-Barre Syndrome? no    Do you currently have an acute febrile illness? no    Have you ever had a severe reaction to latex? no    Vaccine information given and explained to patient? yes    Are you currently pregnant? no    Lot Number:[id number]   Exp Date:08/19/2010   Site Given Right Deltoid IM Lanier Prude, [facility])  November 15, 2009 9:27 AM

## 2010-03-21 NOTE — Progress Notes (Signed)
Summary: PA Nuvigil  Phone Note Call from Patient Call back at [facility] Phone [phone]   Caller: Patient Summary of Call: Patient called [facility]  that rx for Nuvigil requires PA. Need to call [phone].Ellison Hughs Archie CMA  November 18, 2009 2:35 PM   Called [facility] @ [phone], awaiting form. Ophelia Charter  November 23, 2009 4:06 PM   Faxed to [facility] @ [phone], awaiting approval. Ophelia Charter  November 28, 2009 4:21 PM  Insurance denied because narcolepsy has not been confirmed by sleep lab evaluation, Initial call taken by: Ophelia Charter,  November 29, 2009 3:32 PM  Follow-up for Phone Call        Pls inform pt - Does she want to be ref to sleep clinic? Follow-up by: Cassandria Anger MD,  November 29, 2009 5:47 PM  Additional Follow-up for Phone Call Additional follow up Details #1::        Left detailed vm with patient to call office back if she would like to proceed with sleep clinic Additional Follow-up by: Charlsie Quest, [place],  November 30, 2009 4:03 PM

## 2010-03-21 NOTE — Assessment & Plan Note (Signed)
Summary: 2 MO ROV /NWS  #    Vital Signs:  Patient profile:   40 year old female Weight:      152 pounds Temp:     97.2 degrees F oral Pulse rate:   95 / minute BP sitting:   106 / 66  (left arm)  Vitals Entered By: Doralee Albino (May 17, 2009 9:57 AM) CC: f/u Is Patient Diabetic? No   Primary Care Provider:  Plotnikov  CC:  f/u.  History of Present Illness: The patient presents for a follow up of back pain, anxiety, depression and colitis/fistulas.   Preventive Screening-Counseling & Management  Alcohol-Tobacco     Smoking Status: current  Current Medications (verified): 1)  Norethindrone Acetate 5 Mg  Tabs (Norethindrone Acetate) .... Once Daily 2)  Xanax 0.5 Mg  Tabs (Alprazolam) .... At Bedtime 3)  Cobal-1000 1000 Mcg/ml Inj Soln (Cyanocobalamin) .[name].. 1 Cc Q 1 Mo Sq 4)  Vitamin D 2000 Unit  Tabs (Cholecalciferol) .... Once Daily 5)  Klor-Con M20 20 Meq  Tbcr (Potassium Chloride Crys Cr) .... Once Daily 6)  Bd Luer-Lok Syringe 25g X 5/8" 3 Ml Misc (Syringe/needle (Disp)) .... As Directed 7)  Zoloft 100 Mg Tabs (Sertraline Hcl) .... Two Times A Day 8)  Promethazine Hcl 25 Mg  Tabs (Promethazine Hcl) .[name].. 1 By Mouth Two Times A Day As Needed Nausea 9)  Hydrocodone-Acetaminophen 10-650 Mg Tabs (Hydrocodone-Acetaminophen) .[name].. 1 By Mouth Qid As Needed Please,  Fill On or After   04/27/09         . Ok To Fill 1-2 Day Earlier When The Month Has 31 Days in It. 10)  Skelaxin 800 Mg Tabs (Metaxalone) .[name].. 1 By Mouth Two Times A Day As Needed Back Spasm 11)  Nexium 40 Mg Cpdr (Esomeprazole Magnesium) .[name].. 1 By Mouth Qam ( Medically Necessary) 12)  Meperidine Hcl 50 Mg Tabs (Meperidine Hcl) .[name].. 1 By Mouth Once Daily As Needed Severe Pain 13)  Ketorolac Tromethamine 10 Mg Tabs (Ketorolac Tromethamine) .[name].. 1 By Mouth Two Times A Day As Needed For Severe Pain 14)  Ciprofloxacin Hcl 500 Mg Tabs (Ciprofloxacin Hcl) .[name].. 1 By Mouth Bid 15)  Betamethasone Dipropionate Aug 0.05 % Oint  (Betamethasone Dipropionate Aug) .... Use Two Times A Day Prn  Allergies: 1)  ! Morphine 2)  ! Keflex 3)  ! Prilosec 4)  ! Sulfa 5)  ! Codeine 6)  ! * Guaifenesin 7)  ! Cimzia (Certolizumab Pegol)  Past History:  Past Medical History: Last updated: 01/24/2009 Low back pain  Dr Mina Marble GERD Anxiety Crohn's Dr Perrin Smack. vag. 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    Physical Exam  General:  NAD, overweight-appearing.   Nose:  External nasal examination shows no deformity or inflammation. Nasal mucosa are pink and moist without lesions or exudates. Mouth:  Oral mucosa and oropharynx without lesions or exudates.  Teeth in good repair. Lungs:  Normal respiratory effort, chest expands symmetrically. Lungs are clear to auscultation, no crackles or wheezes. Heart:  Normal rate and regular rhythm. S1 and S2 normal without gallop, murmur, click, rub or other extra sounds. Abdomen:  Bowel sounds positive,abdomen soft  and sensitive without masses, organomegaly or hernias noted. Msk:  No deformity or scoliosis noted of thoracic or lumbar spine.  Lumbar-sacral spine is tender to palpation over paraspinal muscles and painfull with the ROM  Extremities:  No clubbing, cyanosis, edema, or deformity noted with normal full range of motion of all joints.   Neurologic:  No cranial nerve deficits noted. Station and gait are normal. Plantar reflexes are down-going bilaterally. DTRs are symmetrical throughout. Sensory, motor and coordinative functions appear intact. Skin:  Intact without suspicious lesions or rashes Psych:  Oriented X3, normally interactive, good eye  contact, and depressed affect.     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 1 by mouth qid as needed    Skelaxin 800 Mg Tabs (Metaxalone) .[name]... 1 by mouth two times a day as needed back spasm    Meperidine Hcl 50 Mg Tabs (Meperidine hcl) .[name]... 1 by mouth once daily as needed severe pain    Ketorolac Tromethamine 10 Mg Tabs (Ketorolac tromethamine) .[name]... 1 by mouth two times a day as needed for severe pain    Cyclobenzaprine Hcl 10 Mg Tabs (Cyclobenzaprine hcl) .[name]... 1/2-1 tab by mouth two times a day as needed muscle spasms  Problem # 4:  B12 DEFICIENCY (ICD-266.2) Assessment: Unchanged On prescription/OTC  therapy   Problem # 5:  DEPRESSION (ICD-311) Assessment: Unchanged  Her updated medication list for this problem includes:    Xanax 0.5 Mg Tabs (Alprazolam) .[name]... At bedtime    Zoloft 100 Mg Tabs (Sertraline hcl) .[name]..[name] Two times a day  Complete Medication List: 1)  Norethindrone Acetate 5 Mg Tabs (Norethindrone acetate) .... Once daily 2)  Xanax 0.5 Mg Tabs (Alprazolam) .... At bedtime 3)  Cobal-1000 1000 Mcg/ml Inj Soln (Cyanocobalamin) .[name].. 1 cc q 1 mo sq 4)  Vitamin D 2000 Unit Tabs (Cholecalciferol) .... Once daily 5)  Klor-con M20 20 Meq Tbcr (Potassium chloride crys cr) .... Once daily 6)  Bd Luer-lok Syringe 25g X 5/8" 3 Ml Misc (Syringe/needle (disp)) .... As directed 7)  Zoloft 100 Mg Tabs (Sertraline hcl) .... Two times a day 8)  Promethazine Hcl 25 Mg Tabs (Promethazine hcl) .[name].. 1 by mouth two times a day as needed  nausea 9)  Hydrocodone-acetaminophen 10-650 Mg Tabs (Hydrocodone-acetaminophen) .[name].. 1 by mouth qid as needed 10)  Skelaxin 800 Mg Tabs (Metaxalone) .[name].. 1 by mouth two times a day as needed back spasm 11)  Nexium 40 Mg Cpdr (Esomeprazole magnesium) .[name].. 1 by mouth qam ( medically necessary) 12)  Meperidine Hcl 50 Mg Tabs (Meperidine hcl) .[name].. 1 by mouth once daily as needed severe pain 13)  Ketorolac Tromethamine 10 Mg Tabs (Ketorolac tromethamine) .[name].. 1 by mouth two times a day as needed for severe pain 14)  Ciprofloxacin Hcl 500 Mg Tabs (Ciprofloxacin hcl) .[name].. 1 by mouth bid 15)  Betamethasone Dipropionate Aug 0.05 % Oint (Betamethasone dipropionate aug) .... Use two times a day prn 16)  Diflucan 100 Mg Tabs (Fluconazole) .... Take two tablets on the first day, than  1 by mouth once daily untill gone for a fungul infection 17)  Cyclobenzaprine Hcl 10 Mg Tabs (Cyclobenzaprine hcl) .... 1/2-1 tab by mouth two times a day as needed muscle spasms  Patient Instructions: 1)  Please schedule a follow-up appointment in 2 months. Prescriptions: BD LUER-LOK SYRINGE 25G X 5/8" 3 ML MISC (SYRINGE/NEEDLE (DISP)) as directed  #30 x 3   Entered and Authorized by:   Cassandria Anger MD   Signed by:   Cassandria Anger MD on 05/17/2009   Method used:   Print then Give to Patient   RxID:   [id number] CYCLOBENZAPRINE HCL 10 MG TABS (CYCLOBENZAPRINE HCL) 1/2-1 tab by mouth two times a day as needed muscle spasms  #60 x 3   Entered and Authorized by:   Cassandria Anger MD   Signed by:   Cassandria Anger MD on 05/17/2009   Method used:   Print then Give to Patient   RxID:   [id number] DIFLUCAN 100 MG TABS (FLUCONAZOLE) Take two tablets on the first day, than  1 by mouth once daily untill gone for a fungul infection  #11 x 1   Entered and Authorized by:   Cassandria Anger MD   Signed by:   Cassandria Anger MD on 05/17/2009   Method used:   Print then Give to Patient   RxID:    [id number] HYDROCODONE-ACETAMINOPHEN 10-650 MG TABS (HYDROCODONE-ACETAMINOPHEN) 1 by mouth qid as needed  #120 x 1   Entered and Authorized by:   Cassandria Anger MD   Signed by:   Cassandria Anger MD on 05/17/2009   Method used:   Print then Give to Patient   RxID:   [id number] MEPERIDINE HCL 50 MG TABS (MEPERIDINE HCL) 1 by mouth once daily as needed severe pain  #12 x 0   Entered and Authorized by:   Cassandria Anger MD   Signed by:   Cassandria Anger MD on 05/17/2009   Method used:   Print then Give to Patient   RxID:   [id number] KETOROLAC TROMETHAMINE 10 MG TABS (KETOROLAC TROMETHAMINE) 1 by mouth two times a day as needed for severe pain  #20 x 1   Entered and Authorized by:   Cassandria Anger MD   Signed by:   Cassandria Anger MD on 05/17/2009   Method used:   Print then Give to Patient   RxID:   [id number]

## 2010-03-21 NOTE — Progress Notes (Signed)
Summary: LAB REPORT  Phone Note From Other Clinic   Summary of Call: LAB CALLED: U/a is positive, do you want culture.  Initial call taken by: Charlsie Quest, [place],  Jul 13, 2009 10:29 AM  Follow-up for Phone Call        Yes per MD, lab informed and req we put order in EMR. Completed.  Follow-up by: Charlsie Quest, [place],  Jul 13, 2009 10:34 AM  Additional Follow-up for Phone Call Additional follow up Details #1::        Agree. Thx Additional Follow-up by: Cassandria Anger MD,  Jul 13, 2009 12:58 PM

## 2010-03-21 NOTE — Progress Notes (Signed)
Summary: RF  Phone Note Refill Request   Refills Requested: Medication #1:  KETOROLAC TROMETHAMINE 10 MG TABS 1 by mouth two times a day as needed for severe pain [facility]  Initial call taken by: Charlsie Quest, [place],  January 10, 2010 9:33 AM  Follow-up for Phone Call        ok to ref Follow-up by: Cassandria Anger MD,  January 10, 2010 1:11 PM    Prescriptions: KETOROLAC TROMETHAMINE 10 MG TABS (KETOROLAC TROMETHAMINE) 1 by mouth two times a day as needed for severe pain  #20 x 0   Entered by:   Charlsie Quest, CMA   Authorized by:   Cassandria Anger MD   Signed by:   Charlsie Quest, CMA on 01/10/2010   Method used:   Electronically to        [facility] [phone]* (retail)       [place]  [address]       Ph: [phone]       Fax: [phone]   RxID:   [id number]

## 2010-03-21 NOTE — Assessment & Plan Note (Signed)
Summary: 2 mo rov /nws   Vital Signs:  Patient profile:   40 year old female Height:      62 inches Weight:      176 pounds BMI:     32.31 Temp:     98.3 degrees F oral Pulse rate:   80 / minute Pulse rhythm:   regular Resp:     16 per minute BP sitting:   130 / 86  (left arm) Cuff size:   regular  Vitals Entered By: Jonathon Resides, CMA(AAMA) (January 16, 2010 9:12 AM) CC: 2 mo f/u   Is Patient Diabetic? No Comments was recently eval/treated for bilateral edema and UTI. See attached labs   Primary Care Provider:  Plotnikov  CC:  2 mo f/u  .  History of Present Illness: The patient presents for a follow up of colitis, back pain, anxiety, depression and headaches. C/o feet swelling on Sat - went to ER on Sun AM - they said she had a UTI - got Cipro, other labs were OK  Preventive Screening-Counseling & Management  Alcohol-Tobacco     Smoking Status: quit  Current Medications (verified): 1)  Hydrocodone-Acetaminophen 10-650 Mg Tabs (Hydrocodone-Acetaminophen) .[name].. 1 By Mouth Qid As Needed 2)  Norethindrone Acetate 5 Mg  Tabs (Norethindrone Acetate) .... Once Daily 3)  Xanax 0.5 Mg  Tabs (Alprazolam) .... At Bedtime 4)  Cobal-1000 1000 Mcg/ml Inj Soln (Cyanocobalamin) .[name].. 1 Cc Q 1 Mo Sq 5)  Vitamin D 2000 Unit  Tabs (Cholecalciferol) .... Once Daily 6)  Klor-Con M20 20 Meq  Tbcr (Potassium Chloride Crys Cr) .... Once Daily 7)  Zoloft 100 Mg Tabs (Sertraline Hcl) .... Two Times A Day 8)  Promethazine Hcl 25 Mg  Tabs (Promethazine Hcl) .[name].. 1 By Mouth Two Times A Day As Needed Nausea 9)  Meperidine Hcl 50 Mg Tabs (Meperidine Hcl) .[name].. 1 By Mouth Once Daily As Needed Severe Pain 10)  Ketorolac Tromethamine 10 Mg Tabs (Ketorolac Tromethamine) .[name].. 1 By Mouth Two Times A Day As Needed For Severe Pain 11)  Ciprofloxacin Hcl 500 Mg Tabs (Ciprofloxacin Hcl) .[name].. 1 By Mouth Bid 12)  Betamethasone Dipropionate Aug 0.05 % Oint (Betamethasone Dipropionate Aug) .... Use Two Times A Day  Prn 13)  Cyclobenzaprine Hcl 10 Mg Tabs (Cyclobenzaprine Hcl) .... 1/2-1 Tab By Mouth Two Times A Day As Needed Muscle Spasms 14)  Bd Luer-Lok Syringe 25g X 5/8" 3 Ml Misc (Syringe/needle (Disp)) .... As Directed 15)  Nuvigil 150 Mg Tabs (Armodafinil) .[name].. 1 By Mouth Qam For Being More Alert 16)  Cyclobenzaprine Hcl 10 Mg Tabs (Cyclobenzaprine Hcl) .[name].. 1 At Bedtime As Needed 17)  Protonix 40 Mg Tbec (Pantoprazole Sodium) .[name].. 1 By Mouth Once Daily 18)  Diflucan 150 Mg Tabs (Fluconazole) .[name].. 1 By Mouth Once Daily Once For Yeast Infection  Allergies (verified): 1)  ! Morphine 2)  ! Keflex 3)  ! Prilosec 4)  ! Sulfa 5)  ! Codeine 6)  ! * Guaifenesin 7)  ! Cimzia (Certolizumab Pegol)  Past History:  Past Medical History: Last updated: 11/15/2009 Low back pain  Dr Mina Marble GERD Anxiety Crohn's Dr Perrin Smack. vag. 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  The patient denies fever, chest pain, dyspnea on exertion, and abdominal pain.    Physical Exam  General:  NAD, overweight-appearing.   Nose:  External nasal examination shows no deformity or inflammation. Nasal mucosa are pink and moist without lesions or exudates. Mouth:  Oral mucosa and oropharynx without lesions or exudates.  Teeth in good repair. Neck:  No deformities, masses, or tenderness noted. Lungs:  Normal respiratory effort, chest expands symmetrically. Lungs are clear to auscultation, no crackles or wheezes. Heart:  Normal rate and regular rhythm. S1 and S2 normal without gallop, murmur, click, rub or other extra sounds. Abdomen:  Obese, NT Msk:  No deformity or scoliosis noted of thoracic or lumbar spine.  Lumbar-sacral spine is tender  to palpation over paraspinal muscles and painfull with the ROM  Pulses:  R and L carotid,radial,femoral,dorsalis pedis and posterior tibial pulses are full and equal bilaterally Extremities:  1+ left pedal edema and 1+ right pedal edema.   B calves NT Neurologic:  No cranial nerve deficits noted. Station and gait are normal. Plantar reflexes are down-going bilaterally. DTRs are symmetrical throughout. Sensory, motor and coordinative functions appear intact. Skin:  Intact without suspicious lesions or rashes Psych:  Oriented X3, normally interactive, good eye contact, and depressed affect.     Impression & Recommendations:  Problem # 1:  EDEMA (HYW-737.3) B due to venous insufficiency and wt gain Assessment New Loose wt. No NSAIDs, denies increased salt intake Call if you are not better in a reasonable amount of time or if worse. Her updated medication list for this problem includes:    Furosemide 20 Mg Tabs (Furosemide) .[name]... 1-2 by mouth qam as needed for swelling as needed and KCl  Problem # 2:  WEIGHT GAIN, ABNORMAL (ICD-783.1) Assessment: Deteriorated  Problem # 3:  CROHN'S DISEASE (ICD-555.9) Assessment: Unchanged On the regimen of medicine(s) reflected in the chart    Problem # 4:  UTI (ICD-599.0) Assessment: New  Her updated medication list for this problem includes:    Ciprofloxacin Hcl 500 Mg Tabs (Ciprofloxacin hcl) .[name]... 1 by mouth bid  Problem # 5:  VAGINITIS (ICD-616.10) Assessment: New Diflucan  Problem # 6:  LOW BACK PAIN, CHRONIC (ICD-724.2) Assessment: Unchanged  Her updated medication list for this problem includes:    Hydrocodone-acetaminophen 10-650 Mg Tabs (Hydrocodone-acetaminophen) .[name]... 1 by mouth qid as needed    Meperidine Hcl 50 Mg Tabs (Meperidine hcl) .[name]... 1 by mouth once daily as needed severe pain    Ketorolac Tromethamine 10 Mg Tabs (Ketorolac tromethamine) .[name]... 1 by mouth two times a day as needed for severe pain    Cyclobenzaprine Hcl 10  Mg Tabs (Cyclobenzaprine hcl) .[name]... 1/2-1 tab by mouth two times a day as needed muscle spasms    Cyclobenzaprine Hcl 10 Mg Tabs (Cyclobenzaprine hcl) .[name]... 1 at bedtime as needed  Complete Medication List: 1)  Hydrocodone-acetaminophen 10-650 Mg Tabs (Hydrocodone-acetaminophen) .[name].. 1 by mouth qid as needed 2)  Norethindrone Acetate 5 Mg Tabs (Norethindrone acetate) .... Once daily 3)  Xanax 0.5 Mg Tabs (Alprazolam) .... At bedtime 4)  Cobal-1000 1000 Mcg/ml Inj Soln (Cyanocobalamin) .[name].. 1 cc q 1 mo sq 5)  Vitamin D 2000 Unit Tabs (Cholecalciferol) .... Once daily 6)  Klor-con M20 20 Meq Tbcr (Potassium chloride crys cr) .... Once daily 7)  Zoloft 100 Mg Tabs (Sertraline hcl) .... Two times a day 8)  Promethazine Hcl 25 Mg Tabs (Promethazine hcl) .[name].. 1 by mouth two times a day as needed nausea  9)  Meperidine Hcl 50 Mg Tabs (Meperidine hcl) .[name].. 1 by mouth once daily as needed severe pain 10)  Ketorolac Tromethamine 10 Mg Tabs (Ketorolac tromethamine) .[name].. 1 by mouth two times a day as needed for severe pain 11)  Ciprofloxacin Hcl 500 Mg Tabs (Ciprofloxacin hcl) .[name].. 1 by mouth bid 12)  Betamethasone Dipropionate Aug 0.05 % Oint (Betamethasone dipropionate aug) .... Use two times a day prn 13)  Cyclobenzaprine Hcl 10 Mg Tabs (Cyclobenzaprine hcl) .... 1/2-1 tab by mouth two times a day as needed muscle spasms 14)  Bd Luer-lok Syringe 25g X 5/8" 3 Ml Misc (Syringe/needle (disp)) .... As directed 15)  Nuvigil 150 Mg Tabs (Armodafinil) .[name].. 1 by mouth qam for being more alert 16)  Cyclobenzaprine Hcl 10 Mg Tabs (Cyclobenzaprine hcl) .[name].. 1 at bedtime as needed 17)  Protonix 40 Mg Tbec (Pantoprazole sodium) .[name].. 1 by mouth once daily 18)  Diflucan 100 Mg Tabs (Fluconazole) .... Take two tablets on the first day, than  1 by mouth once daily untill gone for a fungul infection 19)  Furosemide 20 Mg Tabs (Furosemide) .[name].. 1-2 by mouth qam as needed for swelling as needed 20)  Klor-con M10 10 Meq  Cr-tabs (Potassium chloride crys cr) .[name].. 1 by mouth qd  Patient Instructions: 1)  Please schedule a follow-up appointment in 2 months. 2)  Call if you are not better in a reasonable amount of time or if worse.  Prescriptions: MEPERIDINE HCL 50 MG TABS (MEPERIDINE HCL) 1 by mouth once daily as needed severe pain  #10 x 0   Entered and Authorized by:   Cassandria Anger MD   Signed by:   Cassandria Anger MD on 01/16/2010   Method used:   Print then Give to Patient   RxID:   [id number] KLOR-CON M10 10 MEQ CR-TABS (POTASSIUM CHLORIDE CRYS CR) 1 by mouth qd  #30 x 6   Entered and Authorized by:   Cassandria Anger MD   Signed by:   Cassandria Anger MD on 01/16/2010   Method used:   Print then Give to Patient   RxID:   [id number] FUROSEMIDE 20 MG TABS (FUROSEMIDE) 1-2 by mouth qam as needed for swelling as needed  #30 x 6   Entered and Authorized by:   Cassandria Anger MD   Signed by:   Cassandria Anger MD on 01/16/2010   Method used:   Print then Give to Patient   RxID:   [id number] KETOROLAC TROMETHAMINE 10 MG TABS (KETOROLAC TROMETHAMINE) 1 by mouth two times a day as needed for severe pain  #20 x 0   Entered and Authorized by:   Cassandria Anger MD   Signed by:   Cassandria Anger MD on 01/16/2010   Method used:   Print then Give to Patient   RxID:   [id number] CYCLOBENZAPRINE HCL 10 MG TABS (CYCLOBENZAPRINE HCL) 1/2-1 tab by mouth two times a day as needed muscle spasms  #60 x 3   Entered and Authorized by:   Cassandria Anger MD   Signed by:   Cassandria Anger MD on 01/16/2010   Method used:   Print then Give to Patient   RxID:   [id number] HYDROCODONE-ACETAMINOPHEN 10-650 MG TABS (HYDROCODONE-ACETAMINOPHEN) 1 by mouth qid as needed  #120 x 1   Entered and Authorized by:   Cassandria Anger MD   Signed by:   Cassandria Anger MD on 01/16/2010   Method  used:   Print then Give to Patient   RxID:   [id number] DIFLUCAN  100 MG TABS (FLUCONAZOLE) Take two tablets on the first day, than  1 by mouth once daily untill gone for a fungul infection  #11 x 1   Entered and Authorized by:   Cassandria Anger MD   Signed by:   Cassandria Anger MD on 01/16/2010   Method used:   Electronically to        [id number] (retail)       [address] [place]  [address]       Ph: [phone]       Fax: [phone]   RxID:   [id number]    Orders Added: 1)  Est. Patient Level IV [85501]

## 2010-03-21 NOTE — Medication Information (Signed)
Summary: Prior autho & Denied for Nuvigil/[facility]  Prior [place] for Nuvigil/[facility]   Imported By: Phillis Knack 12/02/2009 08:34:56  _____________________________________________________________________  External Attachment:    Type:   Image     Comment:   External Document

## 2010-03-21 NOTE — Letter (Signed)
Summary: GI[facility]  GI/[facility]   Imported By: Sherian Rein 10/18/2009 13:50:58  _____________________________________________________________________  External Attachment:    Type:   Image     Comment:   External Document

## 2010-03-21 NOTE — Letter (Signed)
Summary: Gastroenterology/[facility]  Gastroenterology/[facility]   Imported By: Sherian Rein 11/09/2009 14:53:25  _____________________________________________________________________  External Attachment:    Type:   Image     Comment:   External Document

## 2010-03-21 NOTE — Assessment & Plan Note (Signed)
Summary: 2 MO ROV /NWS   Vital Signs:  Patient profile:   40 year old female Height:      62 inches Weight:      163 pounds BMI:     29.92 O2 Sat:      97 % on Room air Temp:     98.2 degrees F oral Pulse rate:   105 / minute BP sitting:   128 / 84  (left arm) Cuff size:   regular  Vitals Entered By: Charlynne Cousins CMA (Jul 13, 2009 8:20 AM)  O2 Flow:  Room air CC: follow-up visit/ ab   Primary Care Provider:  Plotnikov  CC:  follow-up visit/ ab.  History of Present Illness: The patient presents for a follow up of back pain, anxiety, depression and colitis. C/o wt gain. She had a tick bite and took Doxy. Now on Cipro daily 500 mg  Current Medications (verified): 1)  Norethindrone Acetate 5 Mg  Tabs (Norethindrone Acetate) .... Once Daily 2)  Xanax 0.5 Mg  Tabs (Alprazolam) .... At Bedtime 3)  Cobal-1000 1000 Mcg/ml Inj Soln (Cyanocobalamin) .[name].. 1 Cc Q 1 Mo Sq 4)  Vitamin D 2000 Unit  Tabs (Cholecalciferol) .... Once Daily 5)  Klor-Con M20 20 Meq  Tbcr (Potassium Chloride Crys Cr) .... Once Daily 6)  Bd Luer-Lok Syringe 25g X 5/8" 3 Ml Misc (Syringe/needle (Disp)) .... As Directed 7)  Zoloft 100 Mg Tabs (Sertraline Hcl) .... Two Times A Day 8)  Promethazine Hcl 25 Mg  Tabs (Promethazine Hcl) .[name].. 1 By Mouth Two Times A Day As Needed Nausea 9)  Hydrocodone-Acetaminophen 10-650 Mg Tabs (Hydrocodone-Acetaminophen) .[name].. 1 By Mouth Qid As Needed 10)  Skelaxin 800 Mg Tabs (Metaxalone) .[name].. 1 By Mouth Two Times A Day As Needed Back Spasm 11)  Nexium 40 Mg Cpdr (Esomeprazole Magnesium) .[name].. 1 By Mouth Qam ( Medically Necessary) 12)  Meperidine Hcl 50 Mg Tabs (Meperidine Hcl) .[name].. 1 By Mouth Once Daily As Needed Severe Pain 13)  Ketorolac Tromethamine 10 Mg Tabs (Ketorolac Tromethamine) .[name].. 1 By Mouth Two Times A Day As Needed For Severe Pain 14)  Ciprofloxacin Hcl 500 Mg Tabs (Ciprofloxacin Hcl) .[name].. 1 By Mouth Bid 15)  Betamethasone Dipropionate Aug 0.05 % Oint (Betamethasone  Dipropionate Aug) .... Use Two Times A Day Prn 16)  Cyclobenzaprine Hcl 10 Mg Tabs (Cyclobenzaprine Hcl) .... 1/2-1 Tab By Mouth Two Times A Day As Needed Muscle Spasms  Allergies (verified): 1)  ! Morphine 2)  ! Keflex 3)  ! Prilosec 4)  ! Sulfa 5)  ! Codeine 6)  ! * Guaifenesin 7)  ! Cimzia (Certolizumab Pegol)  Past History:  Past Medical History: Last updated: 01/24/2009 Low back pain  Dr Mina Marble GERD Anxiety Crohn's Dr Perrin Smack. vag. 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  The patient denies fever, chest pain, melena, and hematochezia.    Physical Exam  General:  NAD, overweight-appearing.   Nose:  External nasal examination shows no deformity or inflammation. Nasal mucosa are pink and moist without lesions or exudates. Mouth:  Oral mucosa and oropharynx without lesions or exudates.  Teeth in good repair. Neck:  No deformities, masses, or tenderness noted. Lungs:  Normal respiratory  effort, chest expands symmetrically. Lungs are clear to auscultation, no crackles or wheezes. Heart:  Normal rate and regular rhythm. S1 and S2 normal without gallop, murmur, click, rub or other extra sounds. Abdomen:  Obese, NT Msk:  No deformity or scoliosis noted of thoracic or lumbar spine.  Lumbar-sacral spine is tender to palpation over paraspinal muscles and painfull with the ROM  Extremities:  No clubbing, cyanosis, edema, or deformity noted with normal full range of motion of all joints.   Neurologic:  No cranial nerve deficits noted. Station and gait are normal. Plantar reflexes are down-going bilaterally. DTRs are symmetrical throughout. Sensory, motor and coordinative functions appear intact. Skin:   Intact without suspicious lesions or rashes Psych:  Oriented X3, normally interactive, good eye contact, and depressed affect.     Impression & Recommendations:  Problem # 1:  CROHN'S DISEASE (ICD-555.9) Assessment Unchanged On prescription drug  therapy   Problem # 2:  B12 DEFICIENCY (ICD-266.2) Assessment: Unchanged  Orders: T-Vitamin D (25-Hydroxy) [phone]) TLB-B12, Serum-Total ONLY (87681-L57) TLB-CBC Platelet - w/Differential (85025-CBCD) TLB-Hepatic/Liver Function Pnl (80076-HEPATIC) TLB-TSH (Thyroid Stimulating Hormone) (84443-TSH) TLB-Udip ONLY (81003-UDIP) TLB-Sedimentation Rate (ESR) (85652-ESR) TLB-BMP (Basic Metabolic Panel-BMET) (26203-TDHRCBU)  Problem # 3:  MIGRAINE HEADACHE (ICD-346.90) Assessment: Unchanged  Her updated medication list for this problem includes:    Hydrocodone-acetaminophen 10-650 Mg Tabs (Hydrocodone-acetaminophen) .[name]... 1 by mouth qid as needed    Meperidine Hcl 50 Mg Tabs (Meperidine hcl) .[name]... 1 by mouth once daily as needed severe pain    Ketorolac Tromethamine 10 Mg Tabs (Ketorolac tromethamine) .[name]... 1 by mouth two times a day as needed for severe pain  Problem # 4:  ANXIETY (ICD-300.00) Assessment: Unchanged  Her updated medication list for this problem includes:    Xanax 0.5 Mg Tabs (Alprazolam) .[name]... At bedtime    Zoloft 100 Mg Tabs (Sertraline hcl) .[name]..[name] Two times a day  Problem # 5:  LOW BACK PAIN, CHRONIC (ICD-724.2) Assessment: Unchanged  Her updated medication list for this problem includes:    Hydrocodone-acetaminophen 10-650 Mg Tabs (Hydrocodone-acetaminophen) .[name]... 1 by mouth qid as needed    Skelaxin 800 Mg Tabs (Metaxalone) .[name]... 1 by mouth two times a day as needed back spasm    Meperidine Hcl 50 Mg Tabs (Meperidine hcl) .[name]... 1 by mouth once daily as needed severe pain    Ketorolac Tromethamine 10 Mg Tabs (Ketorolac tromethamine) .[name]... 1 by mouth two times a day as needed for severe pain    Cyclobenzaprine Hcl  10 Mg Tabs (Cyclobenzaprine hcl) .[name]... 1/2-1 tab by mouth two times a day as needed muscle spasms  Orders: T-Vitamin D (25-Hydroxy) [id number]) TLB-B12, Serum-Total ONLY (32122-Q82) TLB-CBC Platelet - w/Differential (85025-CBCD) TLB-Hepatic/Liver Function Pnl (80076-HEPATIC) TLB-TSH (Thyroid Stimulating Hormone) (84443-TSH) TLB-Udip ONLY (81003-UDIP) TLB-Sedimentation Rate (ESR) (85652-ESR) TLB-BMP (Basic Metabolic Panel-BMET) (50037-CWUGQBV)  Problem # 6:  WEIGHT GAIN, ABNORMAL (ICD-783.1) Assessment: Deteriorated  Orders: T-Vitamin D (25-Hydroxy) [id number]) TLB-B12, Serum-Total ONLY (80034-J17) TLB-CBC Platelet - w/Differential (85025-CBCD) TLB-Hepatic/Liver Function Pnl (80076-HEPATIC) TLB-TSH (Thyroid Stimulating Hormone) (84443-TSH) TLB-Udip ONLY (81003-UDIP) TLB-Sedimentation Rate (ESR) (85652-ESR) TLB-BMP (Basic Metabolic Panel-BMET) (91505-WPVXYIA)  Complete Medication List: 1)  Hydrocodone-acetaminophen 10-650 Mg Tabs (Hydrocodone-acetaminophen) .[name].. 1 by mouth qid as needed 2)  Norethindrone Acetate 5 Mg Tabs (Norethindrone acetate) .... Once daily 3)  Xanax 0.5 Mg Tabs (Alprazolam) .... At bedtime 4)  Cobal-1000 1000 Mcg/ml Inj Soln (Cyanocobalamin) .[name].. 1 cc q 1 mo sq 5)  Vitamin D 2000 Unit Tabs (Cholecalciferol) .... Once daily 6)  Klor-con M20 20  Meq Tbcr (Potassium chloride crys cr) .... Once daily 7)  Bd Luer-lok Syringe 25g X 5/8" 3 Ml Misc (Syringe/needle (disp)) .... As directed 8)  Zoloft 100 Mg Tabs (Sertraline hcl) .... Two times a day 9)  Promethazine Hcl 25 Mg Tabs (Promethazine hcl) .[name].. 1 by mouth two times a day as needed nausea 10)  Skelaxin 800 Mg Tabs (Metaxalone) .[name].. 1 by mouth two times a day as needed back spasm 11)  Nexium 40 Mg Cpdr (Esomeprazole magnesium) .[name].. 1 by mouth qam ( medically necessary) 12)  Meperidine Hcl 50 Mg Tabs (Meperidine hcl) .[name].. 1 by mouth once daily as needed severe pain 13)  Ketorolac Tromethamine 10 Mg Tabs  (Ketorolac tromethamine) .[name].. 1 by mouth two times a day as needed for severe pain 14)  Ciprofloxacin Hcl 500 Mg Tabs (Ciprofloxacin hcl) .[name].. 1 by mouth bid 15)  Betamethasone Dipropionate Aug 0.05 % Oint (Betamethasone dipropionate aug) .... Use two times a day prn 16)  Cyclobenzaprine Hcl 10 Mg Tabs (Cyclobenzaprine hcl) .... 1/2-1 tab by mouth two times a day as needed muscle spasms  Other Orders: T-Urine Culture (Spectrum Order) [id number])  Patient Instructions: 1)  Please schedule a follow-up appointment in 2 months. Prescriptions: MEPERIDINE HCL 50 MG TABS (MEPERIDINE HCL) 1 by mouth once daily as needed severe pain  #10 x 0   Entered and Authorized by:   Cassandria Anger MD   Signed by:   Cassandria Anger MD on 07/13/2009   Method used:   Print then Give to Patient   RxID:   [id number] KETOROLAC TROMETHAMINE 10 MG TABS (KETOROLAC TROMETHAMINE) 1 by mouth two times a day as needed for severe pain  #20 x 1   Entered and Authorized by:   Cassandria Anger MD   Signed by:   Cassandria Anger MD on 07/13/2009   Method used:   Print then Give to Patient   RxID:   [id number] PROMETHAZINE HCL 25 MG  TABS (PROMETHAZINE HCL) 1 by mouth two times a day as needed nausea  #60 x 3   Entered and Authorized by:   Cassandria Anger MD   Signed by:   Cassandria Anger MD on 07/13/2009   Method used:   Print then Give to Patient   RxID:   [id number] BD LUER-LOK SYRINGE 25G X 5/8" 3 ML MISC (SYRINGE/NEEDLE (DISP)) as directed  #30 x 3   Entered and Authorized by:   Cassandria Anger MD   Signed by:   Cassandria Anger MD on 07/13/2009   Method used:   Print then Give to Patient   RxID:   [id number] COBAL-1000 1000 MCG/ML INJ SOLN (CYANOCOBALAMIN) 1 cc q 1 mo sq  #10 Not Speci x 11   Entered and Authorized by:   Cassandria Anger MD   Signed by:   Cassandria Anger MD on 07/13/2009   Method used:   Print then Give to Patient   RxID:    [id number] HYDROCODONE-ACETAMINOPHEN 10-650 MG TABS (HYDROCODONE-ACETAMINOPHEN) 1 by mouth qid as needed  #120 x 1   Entered and Authorized by:   Cassandria Anger MD   Signed by:   Cassandria Anger MD on 07/13/2009   Method used:   Print then Give to Patient   RxID:   [id number]

## 2010-03-21 NOTE — Assessment & Plan Note (Signed)
Summary: f/u appt/walkin/cd   Vital Signs:  Patient profile:   40 year old female Weight:      144 pounds BMI:     26.43 Temp:     97.7 degrees F oral Pulse rate:   107 / minute BP sitting:   124 / 70  (left arm)  Vitals Entered By: Doralee Albino (March 24, 2009 8:25 AM) CC: f/u Is Patient Diabetic? No   Primary Care Provider:  Plotnikov  CC:  f/u.  History of Present Illness: The patient presents for a follow up of colitis,  back pain, anxiety, depression and headaches.   Preventive Screening-Counseling & Management  Alcohol-Tobacco     Smoking Status: current  Current Medications (verified): 1)  Norethindrone Acetate 5 Mg  Tabs (Norethindrone Acetate) .... Once Daily 2)  Xanax 0.5 Mg  Tabs (Alprazolam) .... At Bedtime 3)  Cobal-1000 1000 Mcg/ml Inj Soln (Cyanocobalamin) .[name].. 1 Cc Q 1 Mo Sq 4)  Vitamin D 2000 Unit  Tabs (Cholecalciferol) .... Once Daily 5)  Klor-Con M20 20 Meq  Tbcr (Potassium Chloride Crys Cr) .... Once Daily 6)  Bd Luer-Lok Syringe 25g X 5/8" 3 Ml Misc (Syringe/needle (Disp)) .... As Directed 7)  Zoloft 100 Mg Tabs (Sertraline Hcl) .... Two Times A Day 8)  Promethazine Hcl 25 Mg  Tabs (Promethazine Hcl) .[name].. 1 By Mouth Two Times A Day As Needed Nausea 9)  Hydrocodone-Acetaminophen 10-650 Mg Tabs (Hydrocodone-Acetaminophen) .[name].. 1 By Mouth Qid Prn 10)  Skelaxin 800 Mg Tabs (Metaxalone) .[name].. 1 By Mouth Two Times A Day As Needed Back Spasm 11)  Nexium 40 Mg Cpdr (Esomeprazole Magnesium) .[name].. 1 By Mouth Qam ( Medically Necessary) 12)  Meperidine Hcl 50 Mg Tabs (Meperidine Hcl) .[name].. 1 By Mouth Once Daily As Needed Severe Pain 13)  Ketorolac Tromethamine 10 Mg Tabs (Ketorolac Tromethamine) .[name].. 1 By Mouth Two Times A Day As Needed For Severe Pain 14)  Ciprofloxacin Hcl 500 Mg Tabs (Ciprofloxacin Hcl) .[name].. 1 By Mouth Bid  Allergies: 1)  ! Morphine 2)  ! Keflex 3)  ! Prilosec 4)  ! Sulfa 5)  ! Codeine 6)  ! * Guaifenesin 7)  ! Cimzia (Certolizumab  Pegol)  Past History:  Past Medical History: Last updated: 01/24/2009 Low back pain  Dr Mina Marble GERD Anxiety Crohn's Dr Perrin Smack. vag. 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  The patient denies anorexia, fever, and difficulty walking.    Physical Exam  General:  overweight-appearing.   Nose:  External nasal examination shows no deformity or inflammation. Nasal mucosa are pink and moist without lesions or exudates. Mouth:  Oral mucosa and oropharynx without lesions or exudates.  Teeth in good repair. Lungs:  Normal respiratory effort, chest expands symmetrically. Lungs are clear to auscultation, no crackles or wheezes. Heart:  Normal rate and regular rhythm. S1 and S2 normal without gallop, murmur, click, rub or other extra sounds. Abdomen:  Bowel sounds positive,abdomen soft and sensitive without masses, organomegaly or hernias noted. Msk:  No deformity or scoliosis noted of thoracic  or lumbar spine.   Extremities:  No clubbing, cyanosis, edema, or deformity noted with normal full range of motion of all joints.   Neurologic:  No cranial nerve deficits noted. Station and gait are normal. Plantar reflexes are down-going bilaterally. DTRs are symmetrical throughout. Sensory, motor and coordinative functions appear intact. Skin:  Intact without suspicious lesions or rashes Psych:  Oriented X3, normally interactive, good eye contact, and depressed affect.     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 1 by mouth qid as needed please,  fill on or after   04/27/09         . ok to fill 1-2 day earlier when the month has 31 days in it.    Skelaxin 800 Mg Tabs (Metaxalone) .[name]... 1 by mouth two times a day as needed back spasm    Meperidine Hcl 50 Mg Tabs (Meperidine hcl) .[name]... 1 by mouth once daily as needed severe pain    Ketorolac Tromethamine 10 Mg Tabs (Ketorolac tromethamine) .[name]... 1 by mouth two times a day as needed for severe pain  Problem # 4:  B12 DEFICIENCY (ICD-266.2) Assessment: Improved On prescription therapy   Problem # 5:  ANXIETY (ICD-300.00) Assessment: Unchanged  Her updated medication list for this problem includes:    Xanax 0.5 Mg Tabs (Alprazolam) .[name]... At bedtime    Zoloft 100 Mg Tabs (Sertraline hcl) .[name]..[name] Two times a day  Problem # 6:  DEPRESSION (ICD-311) Assessment: Unchanged  Her updated medication list for this problem includes:    Xanax 0.5 Mg Tabs (Alprazolam) .[name]... At bedtime    Zoloft 100 Mg Tabs (Sertraline hcl) .[name]..[name] Two times a day  Complete Medication List: 1)  Norethindrone Acetate 5 Mg Tabs (Norethindrone acetate) .... Once daily 2)  Xanax 0.5 Mg Tabs (Alprazolam) .... At bedtime 3)  Cobal-1000 1000 Mcg/ml Inj Soln (Cyanocobalamin) .[name].. 1 cc q 1 mo sq 4)  Vitamin D 2000 Unit Tabs (Cholecalciferol) .... Once daily 5)  Klor-con M20 20 Meq Tbcr (Potassium chloride crys cr) .... Once daily 6)  Bd Luer-lok Syringe 25g X 5/8" 3 Ml Misc (Syringe/needle (disp)) .... As directed 7)  Zoloft 100 Mg Tabs (Sertraline hcl) .... Two times a day 8)  Promethazine  Hcl 25 Mg Tabs (Promethazine hcl) .[name].. 1 by mouth two times a day as needed nausea 9)  Hydrocodone-acetaminophen 10-650 Mg Tabs (Hydrocodone-acetaminophen) .[name].. 1 by mouth qid as needed please,  fill on or after   04/27/09         . ok to fill 1-2 day earlier when the month has 31 days in it. 10)  Skelaxin 800 Mg Tabs (Metaxalone) .[name].. 1 by mouth two times a day as needed back spasm 11)  Nexium 40 Mg Cpdr (Esomeprazole magnesium) .[name].. 1 by mouth qam ( medically necessary) 12)  Meperidine Hcl 50 Mg Tabs (Meperidine hcl) .[name].. 1 by mouth once daily as needed severe pain 13)  Ketorolac Tromethamine 10 Mg Tabs (Ketorolac tromethamine) .[name].. 1 by mouth two times a day as needed for severe pain 14)  Ciprofloxacin Hcl 500 Mg Tabs (Ciprofloxacin hcl) .[name].. 1 by mouth bid 15)  Betamethasone  Dipropionate Aug 0.05 % Oint (Betamethasone dipropionate aug) .... Use two times a day prn  Patient Instructions: 1)  Please schedule a follow-up appointment in 2 months. Prescriptions: BETAMETHASONE DIPROPIONATE AUG 0.05 % OINT (BETAMETHASONE DIPROPIONATE AUG) use two times a day prn  #45 g x 2   Entered and Authorized by:   Cassandria Anger MD   Signed by:   Cassandria Anger MD on 03/25/2009   Method used:   Print then Give to Patient   RxID:   [id number] MEPERIDINE HCL 50 MG TABS (MEPERIDINE HCL) 1 by mouth once daily as needed severe pain  #12 x 0   Entered and Authorized by:   Cassandria Anger MD   Signed by:   Cassandria Anger MD on 03/24/2009   Method used:   Print then Give to Patient   RxID:   [id number] KETOROLAC TROMETHAMINE 10 MG TABS (KETOROLAC TROMETHAMINE) 1 by mouth two times a day as needed for severe pain  #20 x 1   Entered and Authorized by:   Cassandria Anger MD   Signed by:   Cassandria Anger MD on 03/24/2009   Method used:   Print then Give to Patient   RxID:   [id number] HYDROCODONE-ACETAMINOPHEN 10-650 MG TABS (HYDROCODONE-ACETAMINOPHEN) 1 by mouth qid as  needed Please,  fill on or after   04/27/09         . OK to fill 1-2 day earlier when the month has 31 days in it.  #120 x 0   Entered and Authorized by:   Cassandria Anger MD   Signed by:   Cassandria Anger MD on 03/24/2009   Method used:   Print then Give to Patient   RxID:   [id number] HYDROCODONE-ACETAMINOPHEN 10-650 MG TABS (HYDROCODONE-ACETAMINOPHEN) 1 by mouth qid as needed Please,  fill on or after   03/30/09         . OK to fill 1-2 day earlier when the month has 31 days in it.  #120 x 0   Entered and Authorized by:   Cassandria Anger MD   Signed by:   Cassandria Anger MD on 03/24/2009   Method used:   Print then Give to Patient   RxID:   [id number]

## 2010-03-21 NOTE — Progress Notes (Signed)
Summary: Rf request-Diflucan  Phone Note Call from Patient   Caller: Patient Summary of Call: pt requesting Rf on Diflucan. please advise Initial call taken by: Jonathon Resides, [facility]),  December 29, 2009 10:35 AM  Follow-up for Phone Call        ok  Follow-up by: Cassandria Anger MD,  December 30, 2009 9:11 AM  Additional Follow-up for Phone Call Additional follow up Details #1::        Pt informed  Additional Follow-up by: Charlsie Quest, CMA,  December 30, 2009 10:52 AM    New/Updated Medications: DIFLUCAN 150 MG TABS (FLUCONAZOLE) 1 by mouth once daily once for yeast infection Prescriptions: DIFLUCAN 150 MG TABS (FLUCONAZOLE) 1 by mouth once daily once for yeast infection  #1 x 1   Entered and Authorized by:   Cassandria Anger MD   Signed by:   Charlsie Quest, CMA on 12/30/2009   Method used:   Electronically to        [id number]* (retail)       [address] [place]  [address]       Ph: [phone]       Fax: [phone]   RxID:   [id number]

## 2010-03-21 NOTE — Progress Notes (Signed)
Summary: RF - Toradol   Phone Note Refill Request   Refills Requested: Medication #1:  KETOROLAC TROMETHAMINE 10 MG TABS 1 by mouth two times a day as needed for severe pain   Notes: Next apt is 9/27 To go to [facility] - [phone]  Initial call taken by: Charlsie Quest, [place],  November 08, 2009 9:28 AM  Follow-up for Phone Call        ok to ref Follow-up by: Cassandria Anger MD,  November 08, 2009 5:20 PM    Prescriptions: KETOROLAC TROMETHAMINE 10 MG TABS (KETOROLAC TROMETHAMINE) 1 by mouth two times a day as needed for severe pain  #20 x 1   Entered by:   Charlsie Quest, CMA   Authorized by:   Cassandria Anger MD   Signed by:   Charlsie Quest, CMA on 11/08/2009   Method used:   Electronically to        [facility] [phone]* (retail)       [place]  [address]       Ph: [phone]       Fax: [phone]   RxID:   [id number]

## 2010-03-21 NOTE — Progress Notes (Signed)
Summary: RX  Phone Note Call from Patient   Summary of Call: Patient left message on triage stating that she was to call back with the name of an prescription she told MD she would want. Per the patient it is Augmented Betamethasone Dipropionate Ointment 0.05%. Is it ok to give this prescription?  Patient also wanted to know if her Demerol was supposed to be #12 and why she was only given an prescription for March. Please advise. Initial call taken by: Lucious Groves,  March 24, 2009 1:33 PM  Follow-up for Phone Call        Demerol is correct. OK Betameth Follow-up by: Tresa Garter MD,  March 25, 2009 7:44 AM  Additional Follow-up for Phone Call Additional follow up Details #1::        RX faxed and patient notified. Additional Follow-up by: Lucious Groves,  March 25, 2009 8:24 AM

## 2010-03-21 NOTE — Assessment & Plan Note (Signed)
Summary: 2 MO ROV /NWS  #   Vital Signs:  Patient profile:   40 year old female Height:      62 inches Weight:      162 pounds BMI:     29.74 O2 Sat:      98 % on Room air Temp:     98.7 degrees F oral Pulse rate:   113 / minute Pulse rhythm:   regular Resp:     16 per minute BP sitting:   128 / 90  (left arm) Cuff size:   regular  Vitals Entered By: Jonathon Resides, CMA(AAMA) (September 13, 2009 7:50 AM)  O2 Flow:  Room air CC: 2 mo f/u Is Patient Diabetic? No   Primary Care Provider:  Plotnikov  CC:  2 mo f/u.  History of Present Illness: The patient presents for a follow up of back pain, anxiety, depression and colitis.   Current Medications (verified): 1)  Hydrocodone-Acetaminophen 10-650 Mg Tabs (Hydrocodone-Acetaminophen) .[name].. 1 By Mouth Qid As Needed 2)  Norethindrone Acetate 5 Mg  Tabs (Norethindrone Acetate) .... Once Daily 3)  Xanax 0.5 Mg  Tabs (Alprazolam) .... At Bedtime 4)  Cobal-1000 1000 Mcg/ml Inj Soln (Cyanocobalamin) .[name].. 1 Cc Q 1 Mo Sq 5)  Vitamin D 2000 Unit  Tabs (Cholecalciferol) .... Once Daily 6)  Klor-Con M20 20 Meq  Tbcr (Potassium Chloride Crys Cr) .... Once Daily 7)  Bd Luer-Lok Syringe 25g X 5/8" 3 Ml Misc (Syringe/needle (Disp)) .... As Directed 8)  Zoloft 100 Mg Tabs (Sertraline Hcl) .... Two Times A Day 9)  Promethazine Hcl 25 Mg  Tabs (Promethazine Hcl) .[name].. 1 By Mouth Two Times A Day As Needed Nausea 10)  Skelaxin 800 Mg Tabs (Metaxalone) .[name].. 1 By Mouth Two Times A Day As Needed Back Spasm 11)  Nexium 40 Mg Cpdr (Esomeprazole Magnesium) .[name].. 1 By Mouth Qam ( Medically Necessary) 12)  Meperidine Hcl 50 Mg Tabs (Meperidine Hcl) .[name].. 1 By Mouth Once Daily As Needed Severe Pain 13)  Ketorolac Tromethamine 10 Mg Tabs (Ketorolac Tromethamine) .[name].. 1 By Mouth Two Times A Day As Needed For Severe Pain 14)  Ciprofloxacin Hcl 500 Mg Tabs (Ciprofloxacin Hcl) .[name].. 1 By Mouth Bid 15)  Betamethasone Dipropionate Aug 0.05 % Oint (Betamethasone  Dipropionate Aug) .... Use Two Times A Day Prn 16)  Cyclobenzaprine Hcl 10 Mg Tabs (Cyclobenzaprine Hcl) .... 1/2-1 Tab By Mouth Two Times A Day As Needed Muscle Spasms  Allergies (verified): 1)  ! Morphine 2)  ! Keflex 3)  ! Prilosec 4)  ! Sulfa 5)  ! Codeine 6)  ! * Guaifenesin 7)  ! Cimzia (Certolizumab Pegol)  Past History:  Past Medical History: Last updated: 01/24/2009 Low back pain  Dr Mina Marble GERD Anxiety Crohn's Dr Perrin Smack. vag. 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    Physical Exam  General:  NAD, overweight-appearing.   Head:  Normocephalic and atraumatic without obvious abnormalities. No apparent alopecia or balding. Nose:  External nasal examination shows no deformity or inflammation. Nasal mucosa are pink and moist without lesions or exudates. Mouth:  Oral mucosa and oropharynx without lesions or exudates.  Teeth in good repair. Neck:  No deformities, masses, or tenderness noted. Lungs:  Normal  respiratory effort, chest expands symmetrically. Lungs are clear to auscultation, no crackles or wheezes. Heart:  Normal rate and regular rhythm. S1 and S2 normal without gallop, murmur, click, rub or other extra sounds. Abdomen:  Obese, NT Msk:  No deformity or scoliosis noted of thoracic or lumbar spine.  Lumbar-sacral spine is tender to palpation over paraspinal muscles and painfull with the ROM  Neurologic:  No cranial nerve deficits noted. Station and gait are normal. Plantar reflexes are down-going bilaterally. DTRs are symmetrical throughout. Sensory, motor and coordinative functions appear intact. Skin:  Intact without suspicious lesions or rashes Psych:  Oriented X3, normally  interactive, good eye contact, and depressed affect.     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 At bedtime    Zoloft 100 Mg Tabs (Sertraline hcl) .[name]..[name] Two times a day  Problem # 5:  LOW BACK PAIN, CHRONIC (ICD-724.2) Assessment: Unchanged  Her updated medication list for this problem includes:    Hydrocodone-acetaminophen 10-650 Mg Tabs (Hydrocodone-acetaminophen) .[name]... 1 by mouth qid as needed    Skelaxin 800 Mg Tabs (Metaxalone) .[name]... 1 by mouth two times a day as needed back spasm    Meperidine Hcl 50 Mg Tabs (Meperidine hcl) .[name]... 1 by mouth once daily as needed severe pain    Ketorolac Tromethamine 10 Mg Tabs (Ketorolac tromethamine) .[name]... 1 by mouth two times a day as needed for severe pain    Cyclobenzaprine Hcl 10 Mg Tabs (Cyclobenzaprine hcl) .[name]... 1/2-1 tab by mouth two times a day as needed muscle spasms  Complete Medication List: 1)  Hydrocodone-acetaminophen 10-650 Mg Tabs (Hydrocodone-acetaminophen) .[name].. 1 by mouth qid as needed 2)  Norethindrone Acetate 5 Mg Tabs (Norethindrone acetate) .... Once daily 3)  Xanax 0.5 Mg Tabs (Alprazolam) .... At bedtime 4)  Cobal-1000 1000 Mcg/ml Inj Soln (Cyanocobalamin) .[name].. 1 cc q 1 mo sq 5)  Vitamin D 2000 Unit Tabs (Cholecalciferol) .... Once daily 6)  Klor-con M20 20 Meq Tbcr (Potassium chloride crys cr) .... Once  daily 7)  Bd Luer-lok Syringe 25g X 5/8" 3 Ml Misc (Syringe/needle (disp)) .... As directed 8)  Zoloft 100 Mg Tabs (Sertraline hcl) .... Two times a day 9)  Promethazine Hcl 25 Mg Tabs (Promethazine hcl) .[name].. 1 by mouth two times a day as needed nausea 10)  Skelaxin 800 Mg Tabs (Metaxalone) .[name].. 1 by mouth two times a day as needed back spasm 11)  Nexium 40 Mg Cpdr (Esomeprazole magnesium) .[name].. 1 by mouth qam ( medically necessary) 12)  Meperidine Hcl 50 Mg Tabs (Meperidine hcl) .[name].. 1 by mouth once daily as needed severe pain 13)  Ketorolac Tromethamine 10 Mg Tabs (Ketorolac tromethamine) .[name].. 1 by mouth two times a day as needed for severe pain 14)  Ciprofloxacin Hcl 500 Mg Tabs (Ciprofloxacin hcl) .[name].. 1 by mouth bid 15)  Betamethasone Dipropionate Aug 0.05 % Oint (Betamethasone dipropionate aug) .... Use two times a day prn 16)  Cyclobenzaprine Hcl 10 Mg Tabs (Cyclobenzaprine hcl) .... 1/2-1 tab by mouth two  times a day as needed muscle spasms  Patient Instructions: 1)  Please schedule a follow-up appointment in 2 months. Prescriptions: MEPERIDINE HCL 50 MG TABS (MEPERIDINE HCL) 1 by mouth once daily as needed severe pain  #10 x 0   Entered and Authorized by:   Cassandria Anger MD   Signed by:   Cassandria Anger MD on 09/13/2009   Method used:   Print then Give to Patient   RxID:   [id number] KETOROLAC TROMETHAMINE 10 MG TABS (KETOROLAC TROMETHAMINE) 1 by mouth two times a day as needed for severe pain  #20 x 1   Entered and Authorized by:   Cassandria Anger MD   Signed by:   Cassandria Anger MD on 09/13/2009   Method used:   Print then Give to Patient   RxID:   [id number] HYDROCODONE-ACETAMINOPHEN 10-650 MG TABS (HYDROCODONE-ACETAMINOPHEN) 1 by mouth qid as needed  #120 x 1   Entered and Authorized by:   Cassandria Anger MD   Signed by:   Cassandria Anger MD on 09/13/2009   Method used:   Print then Give to Patient   RxID:   [id number]

## 2010-03-21 NOTE — Progress Notes (Signed)
Summary: Rf request  Phone Note From Pharmacy   Caller: [facility] [address] [phone]* Summary of Call: rec rf request for gen Flexeril.  It is not on active list.  Ok to RF? Initial call taken by: Lanier Prude, [facility]),  December 01, 2009 8:09 AM  Follow-up for Phone Call        ok to ref 10 mg 1 by mouth at bedtime as needed #30 4 ref Follow-up by: Tresa Garter MD,  December 01, 2009 12:34 PM    New/Updated Medications: CYCLOBENZAPRINE HCL 10 MG TABS (CYCLOBENZAPRINE HCL) 1 at bedtime as needed Prescriptions: CYCLOBENZAPRINE HCL 10 MG TABS (CYCLOBENZAPRINE HCL) 1 at bedtime as needed  #30 x 4   Entered by:   Lamar Sprinkles, CMA   Authorized by:   Tresa Garter MD   Signed by:   Lamar Sprinkles, CMA on 12/01/2009   Method used:   Electronically to        [id number]* (retail)       [address]       [place], [place]  [address]       Ph: [phone]       Fax: [phone]   RxID:   [id number]

## 2010-03-21 NOTE — Letter (Signed)
Summary: [facility]  [facility]   Imported By: Lester Eastport 05/30/2009 08:54:42  _____________________________________________________________________  External Attachment:    Type:   Image     Comment:   External Document

## 2010-03-23 NOTE — Assessment & Plan Note (Signed)
Summary: 2 MO ROV /NWS  #   Vital Signs:  Patient profile:   40 year old female Height:      62 inches Weight:      170 pounds BMI:     31.21 Temp:     98.6 degrees F oral Pulse rate:   104 / minute Pulse rhythm:   regular Resp:     16 per minute BP sitting:   120 / 80  (left arm) Cuff size:   regular  Vitals Entered By: Jonathon Resides, CMA(AAMA) (March 08, 2010 8:08 AM) CC: 2 mo f/u  Is Patient Diabetic? No   Primary Care Provider:  Plotnikov  CC:  2 mo f/u .  History of Present Illness: The patient presents for a follow up of back pain, anxiety, depression and Crohn's  Current Medications (verified): 1)  Hydrocodone-Acetaminophen 10-650 Mg Tabs (Hydrocodone-Acetaminophen) .[name].. 1 By Mouth Qid As Needed 2)  Norethindrone Acetate 5 Mg  Tabs (Norethindrone Acetate) .... Once Daily 3)  Xanax 0.5 Mg  Tabs (Alprazolam) .... At Bedtime 4)  Cobal-1000 1000 Mcg/ml Inj Soln (Cyanocobalamin) .[name].. 1 Cc Q 1 Mo Sq 5)  Vitamin D 2000 Unit  Tabs (Cholecalciferol) .... Once Daily 6)  Klor-Con M20 20 Meq  Tbcr (Potassium Chloride Crys Cr) .... Once Daily 7)  Zoloft 100 Mg Tabs (Sertraline Hcl) .... Two Times A Day 8)  Promethazine Hcl 25 Mg  Tabs (Promethazine Hcl) .[name].. 1 By Mouth Two Times A Day As Needed Nausea 9)  Meperidine Hcl 50 Mg Tabs (Meperidine Hcl) .[name].. 1 By Mouth Once Daily As Needed Severe Pain 10)  Ketorolac Tromethamine 10 Mg Tabs (Ketorolac Tromethamine) .[name].. 1 By Mouth Two Times A Day As Needed For Severe Pain 11)  Ciprofloxacin Hcl 500 Mg Tabs (Ciprofloxacin Hcl) .[name].. 1 By Mouth Bid 12)  Betamethasone Dipropionate Aug 0.05 % Oint (Betamethasone Dipropionate Aug) .... Use Two Times A Day Prn 13)  Cyclobenzaprine Hcl 10 Mg Tabs (Cyclobenzaprine Hcl) .... 1/2-1 Tab By Mouth Two Times A Day As Needed Muscle Spasms 14)  Bd Luer-Lok Syringe 25g X 5/8" 3 Ml Misc (Syringe/needle (Disp)) .... As Directed 15)  Nuvigil 150 Mg Tabs (Armodafinil) .[name].. 1 By Mouth Qam For Being More  Alert 16)  Cyclobenzaprine Hcl 10 Mg Tabs (Cyclobenzaprine Hcl) .[name].. 1 At Bedtime As Needed 17)  Protonix 40 Mg Tbec (Pantoprazole Sodium) .[name].. 1 By Mouth Once Daily 18)  Furosemide 20 Mg Tabs (Furosemide) .[name].. 1-2 By Mouth Qam As Needed For Swelling As Needed 19)  Klor-Con M10 10 Meq Cr-Tabs (Potassium Chloride Crys Cr) .[name].. 1 By Mouth Qd  Allergies (verified): 1)  ! Morphine 2)  ! Keflex 3)  ! Prilosec 4)  ! Sulfa 5)  ! Codeine 6)  ! * Guaifenesin 7)  ! Cimzia (Certolizumab Pegol)  Past History:  Past Medical History: Last updated: 11/15/2009 Low back pain  Dr Mina Marble GERD Anxiety Crohn's Dr Perrin Smack. vag. cyst 2008 Peri-anal abscess 2009 - fistula recto-vag Dr Morton Stall (Surgery) Vit B12 def Vit D def arthritis Dr Reche Dixon glucose 2010 Kidney stones  Social History: Last updated: 01/16/2010 Married Alcohol use-no Regular exercise-no Former Smoker Oct 2011  Review of Systems       The patient complains of hematochezia.  The patient denies fever, weight loss, weight gain, dyspnea on exertion, abdominal pain, and melena.    Physical Exam  General:  NAD, overweight-appearing.   Head:  Normocephalic and atraumatic without obvious abnormalities. No apparent alopecia  or balding. Nose:  External nasal examination shows no deformity or inflammation. Nasal mucosa are pink and moist without lesions or exudates. Mouth:  Oral mucosa and oropharynx without lesions or exudates.  Teeth in good repair. Neck:  No deformities, masses, or tenderness noted. Lungs:  Normal respiratory effort, chest expands symmetrically. Lungs are clear to auscultation, no crackles or wheezes. Heart:  Normal rate and regular rhythm. S1 and S2 normal without gallop, murmur, click, rub or other extra sounds. Abdomen:  Obese, NT Msk:  No deformity or scoliosis noted of thoracic or lumbar spine.  Lumbar-sacral spine is tender to palpation over paraspinal muscles and painfull with the ROM  Extremities:   1+ left pedal edema and 1+ right pedal edema.   B calves NT Neurologic:  No cranial nerve deficits noted. Station and gait are normal. Plantar reflexes are down-going bilaterally. DTRs are symmetrical throughout. Sensory, motor and coordinative functions appear intact. Skin:  Intact without suspicious lesions or rashes Psych:  Oriented X3, normally interactive, good eye contact, and depressed affect.     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 1 by mouth qid as needed    Meperidine Hcl 50 Mg Tabs (Meperidine hcl) .[name]... 1 by mouth once daily as needed severe pain    Ketorolac Tromethamine 10 Mg Tabs (Ketorolac tromethamine) .[name]... 1 by mouth two times a day as needed for severe pain    Cyclobenzaprine Hcl 10 Mg Tabs (Cyclobenzaprine hcl) .[name]... 1/2-1 tab by mouth two times a day as needed muscle spasms    Cyclobenzaprine Hcl 10 Mg Tabs (Cyclobenzaprine hcl) .[name]... 1 at bedtime as needed  Problem # 4:  B12 DEFICIENCY (ICD-266.2) Assessment: Unchanged On the regimen of medicine(s) reflected in the chart    Problem # 5:  ARTHRALGIA (ICD-719.40) Assessment: Unchanged On the regimen of medicine(s) reflected in the chart    Problem # 6:  TOBACCO USER (ICD-305.1) Assessment: Improved Using electronic cigarette now  Complete Medication List: 1)  Hydrocodone-acetaminophen 10-650 Mg Tabs (Hydrocodone-acetaminophen) .[name].. 1 by mouth qid as needed 2)  Norethindrone Acetate 5 Mg Tabs (Norethindrone acetate) .... Once daily 3)  Xanax 0.5 Mg Tabs (Alprazolam) .... At bedtime 4)   Cobal-1000 1000 Mcg/ml Inj Soln (Cyanocobalamin) .[name].. 1 cc q 1 mo sq 5)  Vitamin D 2000 Unit Tabs (Cholecalciferol) .... Once daily 6)  Klor-con M20 20 Meq Tbcr (Potassium chloride crys cr) .... Once daily 7)  Zoloft 100 Mg Tabs (Sertraline hcl) .... Two times a day 8)  Promethazine Hcl 25 Mg Tabs (Promethazine hcl) .[name].. 1 by mouth two times a day as needed nausea 9)  Meperidine Hcl 50 Mg Tabs (Meperidine hcl) .[name].. 1 by mouth once daily as needed severe pain 10)  Ketorolac Tromethamine 10 Mg Tabs (Ketorolac tromethamine) .[name].. 1 by mouth two times a day as needed for severe pain 11)  Ciprofloxacin Hcl 500 Mg Tabs (Ciprofloxacin hcl) .[name].. 1 by mouth bid 12)  Betamethasone Dipropionate Aug 0.05 % Oint (Betamethasone dipropionate aug) .... Use two times a day prn 13)  Cyclobenzaprine Hcl 10 Mg Tabs (Cyclobenzaprine hcl) .... 1/2-1 tab by  mouth two times a day as needed muscle spasms 14)  Bd Luer-lok Syringe 25g X 5/8" 3 Ml Misc (Syringe/needle (disp)) .... As directed 15)  Nuvigil 150 Mg Tabs (Armodafinil) .[name].. 1 by mouth qam for being more alert 16)  Cyclobenzaprine Hcl 10 Mg Tabs (Cyclobenzaprine hcl) .[name].. 1 at bedtime as needed 17)  Protonix 40 Mg Tbec (Pantoprazole sodium) .[name].. 1 by mouth once daily 18)  Furosemide 20 Mg Tabs (Furosemide) .[name].. 1-2 by mouth qam as needed for swelling as needed 19)  Klor-con M10 10 Meq Cr-tabs (Potassium chloride crys cr) .[name].. 1 by mouth qd 20)  Diflucan 150 Mg Tabs (Fluconazole) .[name].. 1 by mouth once daily once for yeast infection  Other Orders: TLB-B12, Serum-Total ONLY (53005-R10) TLB-BMP (Basic Metabolic Panel-BMET) (21117-BVAPOLI) TLB-CBC Platelet - w/Differential (85025-CBCD) TLB-Hepatic/Liver Function Pnl (80076-HEPATIC) TLB-Sedimentation Rate (ESR) (85652-ESR) TLB-TSH (Thyroid Stimulating Hormone) (84443-TSH) TLB-Udip ONLY (81003-UDIP)  Patient Instructions: 1)  Please schedule a follow-up appointment in 2 months. Prescriptions: MEPERIDINE HCL 50  MG TABS (MEPERIDINE HCL) 1 by mouth once daily as needed severe pain  #10 x 0   Entered and Authorized by:   Cassandria Anger MD   Signed by:   Cassandria Anger MD on 03/08/2010   Method used:   Print then Give to Patient   RxID:   [id number] KETOROLAC TROMETHAMINE 10 MG TABS (KETOROLAC TROMETHAMINE) 1 by mouth two times a day as needed for severe pain  #20 x 0   Entered and Authorized by:   Cassandria Anger MD   Signed by:   Cassandria Anger MD on 03/08/2010   Method used:   Print then Give to Patient   RxID:   [id number] HYDROCODONE-ACETAMINOPHEN 10-650 MG TABS (HYDROCODONE-ACETAMINOPHEN) 1 by mouth qid as needed  #120 x 1   Entered and Authorized by:   Cassandria Anger MD   Signed by:   Cassandria Anger MD on 03/08/2010   Method used:   Print then Give to Patient   RxID:   [id number] CYCLOBENZAPRINE HCL 10 MG TABS (CYCLOBENZAPRINE HCL) 1 at bedtime as needed  #30 x 4   Entered and Authorized by:   Cassandria Anger MD   Signed by:   Cassandria Anger MD on 03/08/2010   Method used:   Print then Give to Patient   RxID:   [id number] DIFLUCAN 150 MG TABS (FLUCONAZOLE) 1 by mouth once daily once for yeast infection  #3 x 1   Entered and Authorized by:   Cassandria Anger MD   Signed by:   Cassandria Anger MD on 03/08/2010   Method used:   Print then Give to Patient   RxID:   [id number]    Orders Added: 1)  Est. Patient Level IV [24818] 2)  TLB-B12, Serum-Total ONLY [82607-B12] 3)  TLB-BMP (Basic Metabolic Panel-BMET) [59093-JPETKKO] 4)  TLB-CBC Platelet - w/Differential [85025-CBCD] 5)  TLB-Hepatic/Liver Function Pnl [80076-HEPATIC] 6)  TLB-Sedimentation Rate (ESR) [85652-ESR] 7)  TLB-TSH (Thyroid Stimulating Hormone) [84443-TSH] 8)  TLB-Udip ONLY [81003-UDIP]

## 2010-05-13 ENCOUNTER — Other Ambulatory Visit: Payer: Self-pay | Admitting: Internal Medicine

## 2010-05-15 ENCOUNTER — Encounter: Payer: Self-pay | Admitting: Internal Medicine

## 2010-05-15 NOTE — Telephone Encounter (Signed)
Please advise, OK FOR RF?  

## 2010-05-17 ENCOUNTER — Encounter: Payer: Self-pay | Admitting: Internal Medicine

## 2010-05-17 ENCOUNTER — Ambulatory Visit (INDEPENDENT_AMBULATORY_CARE_PROVIDER_SITE_OTHER): Payer: 59 | Admitting: Internal Medicine

## 2010-05-17 DIAGNOSIS — F909 Attention-deficit hyperactivity disorder, unspecified type: Secondary | ICD-10-CM

## 2010-05-17 DIAGNOSIS — R635 Abnormal weight gain: Secondary | ICD-10-CM

## 2010-05-17 DIAGNOSIS — M545 Low back pain, unspecified: Secondary | ICD-10-CM

## 2010-05-17 DIAGNOSIS — K612 Anorectal abscess: Secondary | ICD-10-CM

## 2010-05-17 DIAGNOSIS — E538 Deficiency of other specified B group vitamins: Secondary | ICD-10-CM

## 2010-05-17 DIAGNOSIS — F319 Bipolar disorder, unspecified: Secondary | ICD-10-CM

## 2010-05-17 MED ORDER — CYCLOBENZAPRINE HCL 10 MG PO TABS: 5.0000 mg | ORAL_TABLET | Freq: Every day | ORAL | Status: DC | PRN

## 2010-05-17 MED ORDER — MEPERIDINE HCL 50 MG PO TABS: 50.0000 mg | ORAL_TABLET | Freq: Every day | ORAL | Status: DC | PRN

## 2010-05-17 MED ORDER — KETOROLAC TROMETHAMINE 10 MG PO TABS: 10.0000 mg | ORAL_TABLET | Freq: Two times a day (BID) | ORAL | Status: DC | PRN

## 2010-05-17 MED ORDER — CYANOCOBALAMIN 1000 MCG/ML IJ SOLN: 1000.0000 ug | INTRAMUSCULAR | Status: DC

## 2010-05-17 MED ORDER — HYDROCODONE-ACETAMINOPHEN 10-650 MG PO TABS: 1.0000 | ORAL_TABLET | Freq: Four times a day (QID) | ORAL | Status: DC | PRN

## 2010-05-17 NOTE — Assessment & Plan Note (Signed)
Wt Readings from Last 3 Encounters:  05/17/10 175 lb (79.379 kg)  03/08/10 170 lb (77.111 kg)  01/16/10 176 lb (79.833 kg)

## 2010-05-17 NOTE — Assessment & Plan Note (Signed)
On Rx - see Meds

## 2010-05-17 NOTE — Assessment & Plan Note (Signed)
On Cipro till next wk per GI, then Doxy

## 2010-05-17 NOTE — Assessment & Plan Note (Signed)
On Rx 

## 2010-05-17 NOTE — Telephone Encounter (Signed)
OK to fill this prescription with additional refills x0 Thank you!  

## 2010-05-17 NOTE — Patient Instructions (Signed)
Return in 3 months.

## 2010-05-17 NOTE — Assessment & Plan Note (Addendum)
Not on Rx 

## 2010-05-17 NOTE — Progress Notes (Signed)
  Subjective:    Patient ID: [name] [name], female    DOB: September 11, 1970, 40 y.o.   MRN: [id number]  HPI   The patient is here to follow up on chronic colitis, fistulas, depression, anxiety, headaches and chronic moderate LBP symptoms controlled with medicines, diet and exercise.  F/u B12 def Review of Systems  Constitutional: Negative for appetite change.  HENT: Negative for sneezing.   Eyes: Negative for pain.  Respiratory: Negative for cough.   Cardiovascular: Negative for chest pain.  Genitourinary: Negative for difficulty urinating.  Musculoskeletal: Positive for back pain and arthralgias.  Skin: Positive for wound. Negative for rash.       fistula  Psychiatric/Behavioral: Positive for decreased concentration. Negative for suicidal ideas.       Objective:   Physical Exam  Constitutional: She appears well-developed and well-nourished. No distress.  HENT:  Head: Normocephalic.  Right Ear: External ear normal.  Left Ear: External ear normal.  Nose: Nose normal.  Mouth/Throat: Oropharynx is clear and moist.  Eyes: Conjunctivae are normal. Pupils are equal, round, and reactive to light. Right eye exhibits no discharge. Left eye exhibits no discharge.  Neck: Normal range of motion. Neck supple. No JVD present. No tracheal deviation present. No thyromegaly present.  Cardiovascular: Normal rate, regular rhythm and normal heart sounds.   Pulmonary/Chest: No stridor. No respiratory distress. She has no wheezes.  Abdominal: Soft. Bowel sounds are normal. She exhibits no distension and no mass. There is no tenderness. There is no rebound and no guarding.  Musculoskeletal: She exhibits no edema and no tenderness.  Lymphadenopathy:    She has no cervical adenopathy.  Neurological: She displays normal reflexes. No cranial nerve deficit. She exhibits normal muscle tone. Coordination normal.  Skin: No rash noted. No erythema.  Psychiatric: Her behavior is normal. Judgment and thought  content normal. She exhibits a depressed mood. She expresses no suicidal ideation. She expresses no suicidal plans.          Assessment & Plan:  ABSCESS OF ANAL AND RECTAL REGIONS On Cipro till next wk per GI, then Doxy  ADHD Not on Rx  B12 DEFICIENCY On Rx Labs from [facility] reviewed  BIPOLAR AFFECTIVE DISORDER On Rx  LOW BACK PAIN, CHRONIC On Rx - see Meds  WEIGHT GAIN, ABNORMAL Wt Readings from Last 3 Encounters:  05/17/10 175 lb (79.379 kg)  03/08/10 170 lb (77.111 kg)  01/16/10 176 lb (79.833 kg)

## 2010-05-17 NOTE — Assessment & Plan Note (Signed)
On Rx Labs from [facility] reviewed

## 2010-05-19 ENCOUNTER — Other Ambulatory Visit: Payer: Self-pay | Admitting: Internal Medicine

## 2010-05-19 ENCOUNTER — Encounter: Payer: Self-pay | Admitting: Internal Medicine

## 2010-05-19 NOTE — Telephone Encounter (Signed)
PA requested for Fluconazole 121m PA Form requested from [facility] @ [phone]

## 2010-05-19 NOTE — Telephone Encounter (Signed)
PA form received and forwarded to MD Triage for completion.

## 2010-05-22 ENCOUNTER — Telehealth: Payer: Self-pay | Admitting: *Deleted

## 2010-05-25 NOTE — Telephone Encounter (Signed)
Approval received & faxed to [facility] [in [place] @ [phone] Pt informed.

## 2010-05-29 NOTE — Telephone Encounter (Signed)
refill 

## 2010-05-31 ENCOUNTER — Telehealth: Payer: Self-pay | Admitting: *Deleted

## 2010-05-31 DIAGNOSIS — M545 Low back pain: Secondary | ICD-10-CM

## 2010-05-31 NOTE — Telephone Encounter (Signed)
rec rf req for Hydroco/APAP 10-650 1qid #120.... Last filled 04-29-10

## 2010-05-31 NOTE — Telephone Encounter (Signed)
Ok if time thx

## 2010-06-01 MED ORDER — HYDROCODONE-ACETAMINOPHEN 10-650 MG PO TABS: 1.0000 | ORAL_TABLET | Freq: Four times a day (QID) | ORAL | Status: DC | PRN

## 2010-07-04 NOTE — Assessment & Plan Note (Signed)
[facility]                         GASTROENTEROLOGY OFFICE NOTE   NAME:Faerber, AREEBAH MEINDERS                       MRN:          [id number]  DATE:12/19/2006                            DOB:          [birth year]    ADDENDUM:   On examination:  RECTAL: There are no external rectal abnormalities.   IMPRESSION:  Rectal pain, most likely secondary to local hemorrhoidal  disease.   RECOMMENDATIONS:  Anusol HC suppositories. The patient has refused this,  but is willing to use Proctofoam.     Sandy Salaam. Deatra Ina, MD,FACG  Electronically Signed    RDK/MedQ  DD: 12/24/2006  DT: 12/24/2006  Job #: [id number]

## 2010-07-04 NOTE — Assessment & Plan Note (Signed)
[facility] OFFICE NOTE   NAME:[name] [name], [name] [name]                       MRN:          [id number]  DATE:12/24/2006                            DOB:          Jul 18, 1970    REASON FOR CONSULTATION:  Crohn's disease.   [name] [name] is a 40 year old white female here for a second opinion.  She  has a complicated history of Crohn's ileocolitis diagnosed in 2001.  She  has been followed by Dr. Casey Burkitt at [facility].  She has been  on various regimens including immunosuppressants, Remicade, and  mesalamine.  She most recently was on a protocol utilizing stem cell  research.  She is intolerant of many medications, and has also been  noncompliant as well.  Recent colonoscopy demonstrated what appeared to  be pseudopolyps in the rectum.  She is very concerned about this and has  requested that they be removed for her because of her fear for  developing colon cancer.  She does complain of rectal pain with  defecation.  She has had no bleeding.  Family history is pertinent for a  grandfather dying with colon cancer.  She has frequent loose stools  which are her norm.  She denies abdominal pain per se.  She is also  concerned about Barrett's esophagus.  This question was raised by her  gastroenterologist several years ago in [place].  She has requested  upper endoscopy as well.  She has pyrosis which is well controlled with  Protonix.   PAST MEDICAL HISTORY:  Pertinent for arthritis and depression.  She has  a history of kidney stones.   FAMILY HISTORY:  As stated above.   MEDICATIONS:  1. Lortab.  2. Topamax.  3. Norethindrone.  4. Protonix.  5. Xanax.  6. Lamictal.  7. Zoloft.   She is allergic to [place], [place], [place], and CODEINE.   She smokes half a pack a day, she does not drink.  She is separated and  on disability.   REVIEW OF SYSTEMS:  Positive for joint pain, sleeping problems, night  sweats and fatigue.   PHYSICAL EXAMINATION:  Pulse 72, blood pressure 110/68, weight 139.  HEENT: EOMI.  PERRLA.  Sclerae are anicteric.  Conjunctivae are pink.  NECK:  Supple without thyromegaly, adenopathy or carotid bruits.  CHEST:  Clear to auscultation and percussion without adventitious  sounds.  CARDIAC:  Regular rhythm; normal S1 S2.  There are no murmurs, gallops  or rubs.  ABDOMEN:  Bowel sounds are normoactive.  Abdomen is soft, nontender and  nondistended.  There are no abdominal masses, tenderness, splenic  enlargement or hepatomegaly.  EXTREMITIES:  Full range of motion.  No cyanosis, clubbing or edema.  RECTAL: There are no external rectal abnormalities.   IMPRESSION:  1. Crohn's disease involving the ileum and the colon.  2. Colonic pseudopolyps.  3. Questionable history of Barrett's.  4. Anxiety and depression.  5. Rectal pain, most likely secondary to local hemorrhoidal disease.   RECOMMENDATION:  1. I strongly encouraged Ms. Lehnen to follow up with Dr. Renee Harder for  her Crohn's disease.  I have nothing new to add to his      recommendations and management.  2. Ms. Beem was rather insistent upon having her pseudopolyps      removed.  I spent a great deal of time explaining the significance      of her pseudopolyps and the lack of need to have these removed.      She appeared satisfied with this after a lengthy explanation.  3. Questionable history of Barrett's esophagus.  At her request I will      do an upper endoscopy to rule this out.  I did offer to have Dr.      Renee Harder perform this procedure, but she preferred to have it done      locally.  4. Anusol HC suppositories. The patient has refused this, but is      willing to use Proctofoam.     Sandy Salaam. Deatra Ina, MD,FACG  Electronically Signed    RDK/MedQ  DD: 12/24/2006  DT: 12/24/2006  Job #: [id number]   cc:   Casey Burkitt, MS, MD

## 2010-07-05 ENCOUNTER — Other Ambulatory Visit: Payer: Self-pay | Admitting: Obstetrics and Gynecology

## 2010-07-05 ENCOUNTER — Other Ambulatory Visit (HOSPITAL_COMMUNITY)
Admission: RE | Admit: 2010-07-05 | Discharge: 2010-07-05 | Disposition: A | Discharge status: Home / Self Care | Payer: 59 | Source: Ambulatory Visit | Attending: Obstetrics and Gynecology | Admitting: Obstetrics and Gynecology

## 2010-07-05 DIAGNOSIS — Z01419 Encounter for gynecological examination (general) (routine) without abnormal findings: Secondary | ICD-10-CM | POA: Insufficient documentation

## 2010-07-18 ENCOUNTER — Telehealth: Payer: Self-pay | Admitting: *Deleted

## 2010-07-18 DIAGNOSIS — M545 Low back pain, unspecified: Secondary | ICD-10-CM

## 2010-07-18 NOTE — Telephone Encounter (Signed)
Patient requesting RF of toradol.

## 2010-07-18 NOTE — Telephone Encounter (Signed)
OK to fill this prescription with additional refills x1 Thank you!

## 2010-07-19 ENCOUNTER — Telehealth: Payer: Self-pay | Admitting: *Deleted

## 2010-07-19 MED ORDER — KETOROLAC TROMETHAMINE 10 MG PO TABS: 10.0000 mg | ORAL_TABLET | Freq: Two times a day (BID) | ORAL | Status: DC | PRN

## 2010-07-19 NOTE — Telephone Encounter (Signed)
Patient informed regarding toradol RX

## 2010-07-19 NOTE — Telephone Encounter (Signed)
rx sent into pharmacy

## 2010-08-14 ENCOUNTER — Encounter: Payer: Self-pay | Admitting: Internal Medicine

## 2010-08-14 ENCOUNTER — Ambulatory Visit (INDEPENDENT_AMBULATORY_CARE_PROVIDER_SITE_OTHER): Payer: 59 | Admitting: Internal Medicine

## 2010-08-14 ENCOUNTER — Other Ambulatory Visit (INDEPENDENT_AMBULATORY_CARE_PROVIDER_SITE_OTHER): Payer: 59

## 2010-08-14 DIAGNOSIS — M545 Low back pain, unspecified: Secondary | ICD-10-CM

## 2010-08-14 DIAGNOSIS — F3289 Other specified depressive episodes: Secondary | ICD-10-CM

## 2010-08-14 DIAGNOSIS — N824 Other female intestinal-genital tract fistulae: Secondary | ICD-10-CM

## 2010-08-14 DIAGNOSIS — F411 Generalized anxiety disorder: Secondary | ICD-10-CM

## 2010-08-14 DIAGNOSIS — E538 Deficiency of other specified B group vitamins: Secondary | ICD-10-CM

## 2010-08-14 DIAGNOSIS — E039 Hypothyroidism, unspecified: Secondary | ICD-10-CM

## 2010-08-14 DIAGNOSIS — F329 Major depressive disorder, single episode, unspecified: Secondary | ICD-10-CM

## 2010-08-14 DIAGNOSIS — G43909 Migraine, unspecified, not intractable, without status migrainosus: Secondary | ICD-10-CM

## 2010-08-14 LAB — COMPREHENSIVE METABOLIC PANEL
ALT: 17 U/L (ref 0–35)
AST: 18 U/L (ref 0–37)
Albumin: 4.4 g/dL (ref 3.5–5.2)
Alkaline Phosphatase: 78 U/L (ref 39–117)
BUN: 15 mg/dL (ref 6–23)
CO2: 24 mEq/L (ref 19–32)
Calcium: 8.8 mg/dL (ref 8.4–10.5)
Chloride: 106 mEq/L (ref 96–112)
Creatinine, Ser: 0.8 mg/dL (ref 0.4–1.2)
GFR: 79.53 mL/min (ref >60.00)
Glucose, Bld: 92 mg/dL (ref 70–99)
Potassium: 3.8 mEq/L (ref 3.5–5.1)
Sodium: 137 mEq/L (ref 135–145)
Total Bilirubin: 0.5 mg/dL (ref 0.3–1.2)
Total Protein: 7.2 g/dL (ref 6.0–8.3)

## 2010-08-14 LAB — CBC WITH DIFFERENTIAL/PLATELET
Basophils Absolute: 0 10*3/uL (ref 0.0–0.1)
Basophils Relative: 0.4 % (ref 0.0–3.0)
Eosinophils Absolute: 0.1 10*3/uL (ref 0.0–0.7)
Eosinophils Relative: 0.9 % (ref 0.0–5.0)
HCT: 38.6 % (ref 36.0–46.0)
Hemoglobin: 13.2 g/dL (ref 12.0–15.0)
Lymphocytes Relative: 18.2 % (ref 12.0–46.0)
Lymphs Abs: 1.5 10*3/uL (ref 0.7–4.0)
MCHC: 34.2 g/dL (ref 30.0–36.0)
MCV: 82.1 fl (ref 78.0–100.0)
Monocytes Absolute: 0.4 10*3/uL (ref 0.1–1.0)
Monocytes Relative: 4.4 % (ref 3.0–12.0)
Neutro Abs: 6.4 10*3/uL (ref 1.4–7.7)
Neutrophils Relative %: 76.1 % (ref 43.0–77.0)
Platelets: 204 10*3/uL (ref 150.0–400.0)
RBC: 4.7 Mil/uL (ref 3.87–5.11)
RDW: 14.2 % (ref 11.5–14.6)
WBC: 8.4 10*3/uL (ref 4.5–10.5)

## 2010-08-14 LAB — HEPATIC FUNCTION PANEL
ALT: 17 U/L (ref 0–35)
AST: 18 U/L (ref 0–37)
Albumin: 4.4 g/dL (ref 3.5–5.2)
Alkaline Phosphatase: 78 U/L (ref 39–117)
Bilirubin, Direct: 0.1 mg/dL (ref 0.0–0.3)
Total Bilirubin: 0.5 mg/dL (ref 0.3–1.2)
Total Protein: 7.2 g/dL (ref 6.0–8.3)

## 2010-08-14 LAB — SEDIMENTATION RATE: Sed Rate: 26 mm/hr — ABNORMAL HIGH (ref 0–22)

## 2010-08-14 LAB — TSH: TSH: 1.24 u[IU]/mL (ref 0.35–5.50)

## 2010-08-14 MED ORDER — KETOROLAC TROMETHAMINE 10 MG PO TABS: 10.0000 mg | ORAL_TABLET | Freq: Two times a day (BID) | ORAL | Status: DC | PRN

## 2010-08-14 MED ORDER — HYDROCODONE-ACETAMINOPHEN 10-650 MG PO TABS: 1.0000 | ORAL_TABLET | Freq: Four times a day (QID) | ORAL | Status: DC | PRN

## 2010-08-14 MED ORDER — FLUCONAZOLE 150 MG PO TABS: 150.0000 mg | ORAL_TABLET | Freq: Once | ORAL | Status: DC

## 2010-08-14 NOTE — Assessment & Plan Note (Signed)
Cont Rx

## 2010-08-14 NOTE — Assessment & Plan Note (Signed)
Better now

## 2010-08-14 NOTE — Assessment & Plan Note (Signed)
On Rx 

## 2010-08-14 NOTE — Assessment & Plan Note (Signed)
No change. Cont Rx

## 2010-08-14 NOTE — Assessment & Plan Note (Signed)
Ins did not pay for Provigyl

## 2010-08-14 NOTE — Assessment & Plan Note (Signed)
F/u w/GI and surgery

## 2010-08-14 NOTE — Progress Notes (Signed)
  Subjective:    Patient ID: [name] [name], female    DOB: 08-08-1970, 40 y.o.   MRN: [id number]  HPI   The patient is here to follow up on chronic Crohn's, fistula, depression, anxiety, headaches and chronic moderate LBP symptoms controlled with medicines, diet and exercise (walking).   Review of Systems  Constitutional: Positive for fatigue. Negative for fever, chills, activity change, appetite change and unexpected weight change.  HENT: Negative for congestion, mouth sores and sinus pressure.   Eyes: Negative for visual disturbance.  Respiratory: Negative for cough and chest tightness.   Gastrointestinal: Positive for abdominal pain and blood in stool. Negative for nausea.  Genitourinary: Positive for flank pain and pelvic pain. Negative for frequency, difficulty urinating and vaginal pain.  Musculoskeletal: Positive for back pain. Negative for gait problem.  Skin: Negative for pallor and rash.  Neurological: Negative for dizziness, tremors, weakness, numbness and headaches.  Psychiatric/Behavioral: Negative for suicidal ideas, behavioral problems, confusion, sleep disturbance and agitation. The patient is nervous/anxious.        Objective:   Physical Exam  Constitutional: She appears well-developed. No distress.       Obese   HENT:  Head: Normocephalic.  Right Ear: External ear normal.  Left Ear: External ear normal.  Nose: Nose normal.  Mouth/Throat: Oropharynx is clear and moist.  Eyes: Conjunctivae are normal. Pupils are equal, round, and reactive to light. Right eye exhibits no discharge. Left eye exhibits no discharge.  Neck: Normal range of motion. Neck supple. No JVD present. No tracheal deviation present. No thyromegaly present.  Cardiovascular: Normal rate, regular rhythm and normal heart sounds.   Pulmonary/Chest: No stridor. No respiratory distress. She has no wheezes.  Abdominal: Soft. Bowel sounds are normal. She exhibits no distension and no mass. There is  tenderness. There is no rebound and no guarding.  Musculoskeletal: She exhibits tenderness (LS is tender). She exhibits no edema.  Lymphadenopathy:    She has no cervical adenopathy.  Neurological: She displays normal reflexes. No cranial nerve deficit. She exhibits normal muscle tone. Coordination normal.  Skin: No rash noted. No erythema.  Psychiatric: Her behavior is normal. Judgment and thought content normal.       Sad        Lab Results  Component Value Date   WBC 9.0 03/08/2010   HGB 13.8 03/08/2010   HCT 40.0 03/08/2010   PLT 195.0 03/08/2010   CHOL 168 03/17/2007   TRIG 151* 03/17/2007   HDL 23.3* 03/17/2007   ALT 14 03/08/2010   AST 15 03/08/2010   NA 139 03/08/2010   K 4.6 03/08/2010   CL 106 03/08/2010   CREATININE 1.1 03/08/2010   BUN 19 03/08/2010   CO2 26 03/08/2010   TSH 1.16 03/08/2010     Assessment & Plan:

## 2010-08-16 ENCOUNTER — Telehealth: Payer: Self-pay | Admitting: Internal Medicine

## 2010-08-16 NOTE — Telephone Encounter (Signed)
[name] [name] , please, inform the patient: labs are OK  Thank you !

## 2010-08-17 NOTE — Telephone Encounter (Signed)
Pt informed

## 2010-10-18 ENCOUNTER — Ambulatory Visit (INDEPENDENT_AMBULATORY_CARE_PROVIDER_SITE_OTHER): Payer: 59 | Admitting: Internal Medicine

## 2010-10-18 ENCOUNTER — Encounter: Payer: Self-pay | Admitting: Internal Medicine

## 2010-10-18 DIAGNOSIS — N824 Other female intestinal-genital tract fistulae: Secondary | ICD-10-CM

## 2010-10-18 DIAGNOSIS — K612 Anorectal abscess: Secondary | ICD-10-CM

## 2010-10-18 DIAGNOSIS — R635 Abnormal weight gain: Secondary | ICD-10-CM

## 2010-10-18 DIAGNOSIS — E538 Deficiency of other specified B group vitamins: Secondary | ICD-10-CM

## 2010-10-18 DIAGNOSIS — M545 Low back pain, unspecified: Secondary | ICD-10-CM

## 2010-10-18 DIAGNOSIS — F319 Bipolar disorder, unspecified: Secondary | ICD-10-CM

## 2010-10-18 DIAGNOSIS — F909 Attention-deficit hyperactivity disorder, unspecified type: Secondary | ICD-10-CM

## 2010-10-18 MED ORDER — CYANOCOBALAMIN 1000 MCG/ML IJ SOLN: 1000.0000 ug | INTRAMUSCULAR | Status: DC

## 2010-10-18 MED ORDER — MEPERIDINE HCL 50 MG PO TABS: 50.0000 mg | ORAL_TABLET | Freq: Every day | ORAL | Status: DC | PRN

## 2010-10-18 MED ORDER — HYDROCODONE-ACETAMINOPHEN 10-650 MG PO TABS: 1.0000 | ORAL_TABLET | Freq: Four times a day (QID) | ORAL | Status: DC | PRN

## 2010-10-18 MED ORDER — KETOROLAC TROMETHAMINE 10 MG PO TABS: 10.0000 mg | ORAL_TABLET | Freq: Two times a day (BID) | ORAL | Status: DC | PRN

## 2010-10-18 MED ORDER — FLUCONAZOLE 150 MG PO TABS: 150.0000 mg | ORAL_TABLET | Freq: Once | ORAL | Status: DC

## 2010-10-18 MED ORDER — DOXYCYCLINE MONOHYDRATE 100 MG PO TABS: 100.0000 mg | ORAL_TABLET | Freq: Every day | ORAL | Status: DC

## 2010-10-18 NOTE — Assessment & Plan Note (Signed)
See Meds 

## 2010-10-18 NOTE — Progress Notes (Signed)
  Subjective:    Patient ID: [name] [name], female    DOB: 25-May-1970, 40 y.o.   MRN: [id number]  HPI   The patient is here to follow up on chronic Crohn's, depression, anxiety, headaches and chronic moderate LBP symptoms controlled partially with medicines, diet and exercise. 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  HENT: Negative for congestion, mouth sores and sinus pressure.   Eyes: Negative for visual disturbance.  Respiratory: Negative for cough and chest tightness.   Gastrointestinal: Negative for nausea and abdominal pain.  Genitourinary: Negative for frequency, difficulty urinating and vaginal pain.  Musculoskeletal: Negative for back pain and gait problem.  Skin: Negative for pallor and rash.  Neurological: Negative for dizziness, tremors, weakness, numbness and headaches.  Psychiatric/Behavioral: Negative for confusion and sleep disturbance.   Wt Readings from Last 3 Encounters:  10/18/10 164 lb (74.39 kg)  08/14/10 172 lb (78.019 kg)  05/17/10 175 lb (79.379 kg)       Objective:   Physical Exam  Constitutional: She appears well-developed. No distress.       obese  HENT:  Head: Normocephalic.  Right Ear: External ear normal.  Left Ear: External ear normal.  Nose: Nose normal.  Mouth/Throat: Oropharynx is clear and moist.  Eyes: Conjunctivae are normal. Pupils are equal, round, and reactive to light. Right eye exhibits no discharge. Left eye exhibits no discharge.  Neck: Normal range of motion. Neck supple. No JVD present. No tracheal deviation present. No thyromegaly present.  Cardiovascular: Normal rate, regular rhythm and normal heart sounds.   Pulmonary/Chest: No stridor. No respiratory distress. She has no wheezes.  Abdominal: Soft. Bowel sounds are normal. She exhibits no distension and no mass. There is no tenderness. There is no  rebound and no guarding.  Musculoskeletal: She exhibits tenderness (ls). She exhibits no edema.  Lymphadenopathy:    She has no cervical adenopathy.  Neurological: She displays normal reflexes. No cranial nerve deficit. She exhibits normal muscle tone. Coordination normal.  Skin: No rash noted. No erythema.  Psychiatric: Her behavior is normal. Judgment and thought content normal.    Lab Results  Component Value Date   WBC 8.4 08/14/2010   HGB 13.2 08/14/2010   HCT 38.6 08/14/2010   PLT 204.0 08/14/2010   CHOL 168 03/17/2007   TRIG 151* 03/17/2007   HDL 23.3* 03/17/2007   ALT 17 08/14/2010   ALT 17 08/14/2010   AST 18 08/14/2010   AST 18 08/14/2010   NA 137 08/14/2010   K 3.8 08/14/2010   CL 106 08/14/2010   CREATININE 0.8 08/14/2010   BUN 15 08/14/2010   CO2 24 08/14/2010   TSH 1.24 08/14/2010         Assessment & Plan:  A complex management case

## 2010-10-18 NOTE — Assessment & Plan Note (Signed)
On rx prn

## 2010-10-18 NOTE — Assessment & Plan Note (Signed)
Cont Rx 

## 2010-10-18 NOTE — Assessment & Plan Note (Signed)
On RX 

## 2010-10-18 NOTE — Assessment & Plan Note (Signed)
Lost wt °

## 2010-10-18 NOTE — Assessment & Plan Note (Signed)
On Rx 

## 2010-10-18 NOTE — Patient Instructions (Signed)
Wt Readings from Last 3 Encounters:  10/18/10 164 lb (74.39 kg)  08/14/10 172 lb (78.019 kg)  05/17/10 175 lb (79.379 kg)

## 2010-11-12 ENCOUNTER — Other Ambulatory Visit: Payer: Self-pay | Admitting: Internal Medicine

## 2010-12-06 ENCOUNTER — Other Ambulatory Visit: Payer: Self-pay | Admitting: Internal Medicine

## 2010-12-06 NOTE — Telephone Encounter (Signed)
Patient requesting RF of toradol, please advise.

## 2010-12-07 NOTE — Telephone Encounter (Signed)
Ok for refill? 

## 2010-12-07 NOTE — Telephone Encounter (Signed)
Ok for refill ketorolac

## 2010-12-07 NOTE — Telephone Encounter (Signed)
Duplicate request

## 2010-12-21 ENCOUNTER — Encounter: Payer: Self-pay | Admitting: Internal Medicine

## 2010-12-21 ENCOUNTER — Ambulatory Visit (INDEPENDENT_AMBULATORY_CARE_PROVIDER_SITE_OTHER): Payer: 59 | Admitting: Internal Medicine

## 2010-12-21 DIAGNOSIS — K5090 Crohn's disease, unspecified, without complications: Secondary | ICD-10-CM

## 2010-12-21 DIAGNOSIS — E538 Deficiency of other specified B group vitamins: Secondary | ICD-10-CM

## 2010-12-21 DIAGNOSIS — K219 Gastro-esophageal reflux disease without esophagitis: Secondary | ICD-10-CM

## 2010-12-21 DIAGNOSIS — M545 Low back pain: Secondary | ICD-10-CM

## 2010-12-21 DIAGNOSIS — K612 Anorectal abscess: Secondary | ICD-10-CM

## 2010-12-21 DIAGNOSIS — K5000 Crohn's disease of small intestine without complications: Secondary | ICD-10-CM

## 2010-12-21 DIAGNOSIS — S82409A Unspecified fracture of shaft of unspecified fibula, initial encounter for closed fracture: Secondary | ICD-10-CM | POA: Insufficient documentation

## 2010-12-21 DIAGNOSIS — F329 Major depressive disorder, single episode, unspecified: Secondary | ICD-10-CM

## 2010-12-21 MED ORDER — DOXYCYCLINE MONOHYDRATE 100 MG PO TABS: 100.0000 mg | ORAL_TABLET | Freq: Every day | ORAL | Status: DC

## 2010-12-21 MED ORDER — MEPERIDINE HCL 50 MG PO TABS: 50.0000 mg | ORAL_TABLET | Freq: Every day | ORAL | Status: DC | PRN

## 2010-12-21 MED ORDER — KETOROLAC TROMETHAMINE 10 MG PO TABS: 10.0000 mg | ORAL_TABLET | Freq: Four times a day (QID) | ORAL | Status: DC | PRN

## 2010-12-21 MED ORDER — HYDROCODONE-ACETAMINOPHEN 10-650 MG PO TABS: 1.0000 | ORAL_TABLET | Freq: Four times a day (QID) | ORAL | Status: DC | PRN

## 2010-12-21 NOTE — Assessment & Plan Note (Signed)
On Rx Chronic and severe  Potential benefits of a long term opioids use as well as potential risks (i.e. addiction risk, apnea etc) and complications (i.e. Somnolence, constipation and others) were explained to the patient and were aknowledged. Starting inj by Dr Modesto Charon

## 2010-12-21 NOTE — Assessment & Plan Note (Signed)
Continue with current prescription therapy as reflected on the Med list.  

## 2010-12-21 NOTE — Progress Notes (Signed)
  Subjective:    Patient ID: [name] [name], female    DOB: [birth year], 39 y.o.   MRN: [id number]  HPI   The patient is here to follow up on chronic depression, anxiety, headaches and chronic moderate LBPsymptoms not controlled with medicines, diet and exercise. F/u Crohn's, L fibula fx   Review of Systems  Constitutional: Positive for fatigue. Negative for chills, activity change, appetite change and unexpected weight change.  HENT: Negative for congestion, mouth sores and sinus pressure.   Eyes: Negative for visual disturbance.  Respiratory: Negative for cough and chest tightness.   Gastrointestinal: Positive for abdominal pain and blood in stool. Negative for nausea.  Genitourinary: Negative for frequency, difficulty urinating and vaginal pain.  Musculoskeletal: Positive for back pain. Negative for gait problem.  Skin: Negative for pallor and rash.  Neurological: Negative for dizziness, tremors, weakness, numbness and headaches.  Psychiatric/Behavioral: Negative for confusion and sleep disturbance.       Objective:   Physical Exam  Constitutional: She appears well-developed. No distress.       obese  HENT:  Head: Normocephalic.  Right Ear: External ear normal.  Left Ear: External ear normal.  Nose: Nose normal.  Mouth/Throat: Oropharynx is clear and moist.  Eyes: Conjunctivae are normal. Pupils are equal, round, and reactive to light. Right eye exhibits no discharge. Left eye exhibits no discharge.  Neck: Normal range of motion. Neck supple. No JVD present. No tracheal deviation present. No thyromegaly present.  Cardiovascular: Normal rate, regular rhythm and normal heart sounds.   Pulmonary/Chest: No stridor. No respiratory distress. She has no wheezes.  Abdominal: Soft. Bowel sounds are normal. She exhibits no distension and no mass. There is no tenderness. There is no rebound and no guarding.  Musculoskeletal: She exhibits no edema. Tenderness: LS is tender; L ankle is in  a brace.  Lymphadenopathy:    She has no cervical adenopathy.  Neurological: She displays normal reflexes. No cranial nerve deficit. She exhibits normal muscle tone. Coordination normal.  Skin: No rash noted. No erythema.  Psychiatric: She has a normal mood and affect. Her behavior is normal. Judgment and thought content normal.    Wt Readings from Last 3 Encounters:  12/21/10 161 lb (73.029 kg)  10/18/10 164 lb (74.39 kg)  08/14/10 172 lb (78.019 kg)         Assessment & Plan:

## 2010-12-21 NOTE — Assessment & Plan Note (Addendum)
L leg 10/12 in a brace now BDS is pending Dr Carlean Jews

## 2011-01-20 ENCOUNTER — Other Ambulatory Visit: Payer: Self-pay | Admitting: Internal Medicine

## 2011-02-27 ENCOUNTER — Encounter: Payer: Self-pay | Admitting: Internal Medicine

## 2011-02-27 ENCOUNTER — Ambulatory Visit (INDEPENDENT_AMBULATORY_CARE_PROVIDER_SITE_OTHER): Payer: 59 | Admitting: Internal Medicine

## 2011-02-27 VITALS — BP 138/80 | HR 80 | Temp 97.2°F | Resp 16 | Wt 158.0 lb

## 2011-02-27 DIAGNOSIS — G43909 Migraine, unspecified, not intractable, without status migrainosus: Secondary | ICD-10-CM

## 2011-02-27 DIAGNOSIS — M545 Low back pain: Secondary | ICD-10-CM

## 2011-02-27 DIAGNOSIS — F329 Major depressive disorder, single episode, unspecified: Secondary | ICD-10-CM

## 2011-02-27 DIAGNOSIS — K5090 Crohn's disease, unspecified, without complications: Secondary | ICD-10-CM

## 2011-02-27 DIAGNOSIS — N824 Other female intestinal-genital tract fistulae: Secondary | ICD-10-CM | POA: Diagnosis not present

## 2011-02-27 DIAGNOSIS — Z0000 Encounter for general adult medical examination without abnormal findings: Secondary | ICD-10-CM

## 2011-02-27 DIAGNOSIS — E538 Deficiency of other specified B group vitamins: Secondary | ICD-10-CM

## 2011-02-27 DIAGNOSIS — K612 Anorectal abscess: Secondary | ICD-10-CM

## 2011-02-27 DIAGNOSIS — F411 Generalized anxiety disorder: Secondary | ICD-10-CM

## 2011-02-27 DIAGNOSIS — K219 Gastro-esophageal reflux disease without esophagitis: Secondary | ICD-10-CM

## 2011-02-27 MED ORDER — HYDROCODONE-ACETAMINOPHEN 10-650 MG PO TABS: 1.0000 | ORAL_TABLET | Freq: Four times a day (QID) | ORAL | Status: DC | PRN

## 2011-02-27 MED ORDER — MEPERIDINE HCL 50 MG PO TABS: 50.0000 mg | ORAL_TABLET | Freq: Every day | ORAL | Status: DC | PRN

## 2011-02-27 MED ORDER — KETOROLAC TROMETHAMINE 10 MG PO TABS: 10.0000 mg | ORAL_TABLET | Freq: Four times a day (QID) | ORAL | Status: DC | PRN

## 2011-02-27 MED ORDER — CYCLOBENZAPRINE HCL 10 MG PO TABS: 5.0000 mg | ORAL_TABLET | Freq: Every day | ORAL | Status: DC | PRN

## 2011-02-27 MED ORDER — CYANOCOBALAMIN 1000 MCG/ML IJ SOLN: INTRAMUSCULAR | Status: DC

## 2011-02-27 NOTE — Assessment & Plan Note (Signed)
Continue with current prescription therapy as reflected on the Med list.  

## 2011-02-27 NOTE — Progress Notes (Signed)
Patient ID: [name] [name], female   DOB: 03-25-1970, 41 y.o.   MRN: [id number]  Subjective:    Patient ID: [name] [name], female    DOB: 21-Oct-1970, 41 y.o.   MRN: [id number]  HPI   The patient is here to follow up on chronic depression, anxiety, headaches and chronic moderate LBPsymptoms not controlled with medicines, diet and exercise. F/u Crohn's, L fibula fx   Review of Systems  Constitutional: Positive for fatigue. Negative for chills, activity change, appetite change and unexpected weight change.  HENT: Negative for congestion, mouth sores and sinus pressure.   Eyes: Negative for visual disturbance.  Respiratory: Negative for cough and chest tightness.   Gastrointestinal: Positive for abdominal pain and blood in stool. Negative for nausea.  Genitourinary: Negative for frequency, difficulty urinating and vaginal pain.  Musculoskeletal: Positive for back pain. Negative for gait problem.  Skin: Negative for pallor and rash.  Neurological: Negative for dizziness, tremors, weakness, numbness and headaches.  Psychiatric/Behavioral: Negative for confusion and sleep disturbance.       Objective:   Physical Exam  Constitutional: She appears well-developed. No distress.       obese  HENT:  Head: Normocephalic.  Right Ear: External ear normal.  Left Ear: External ear normal.  Nose: Nose normal.  Mouth/Throat: Oropharynx is clear and moist.  Eyes: Conjunctivae are normal. Pupils are equal, round, and reactive to light. Right eye exhibits no discharge. Left eye exhibits no discharge.  Neck: Normal range of motion. Neck supple. No JVD present. No tracheal deviation present. No thyromegaly present.  Cardiovascular: Normal rate, regular rhythm and normal heart sounds.   Pulmonary/Chest: No stridor. No respiratory distress. She has no wheezes.  Abdominal: Soft. Bowel sounds are normal. She exhibits no distension and no mass. There is no tenderness. There is no rebound and no guarding.    Musculoskeletal: She exhibits no edema and no tenderness (LS is tender w/ROM).  Lymphadenopathy:    She has no cervical adenopathy.  Neurological: She displays normal reflexes. No cranial nerve deficit. She exhibits normal muscle tone. Coordination normal.  Skin: No rash noted. No erythema.  Psychiatric: She has a normal mood and affect. Her behavior is normal. Judgment and thought content normal.    Wt Readings from Last 3 Encounters:  02/27/11 158 lb (71.668 kg)  12/21/10 161 lb (73.029 kg)  10/18/10 164 lb (74.39 kg)         Assessment & Plan:

## 2011-03-16 ENCOUNTER — Other Ambulatory Visit: Payer: Self-pay | Admitting: Internal Medicine

## 2011-03-22 ENCOUNTER — Telehealth: Payer: Self-pay | Admitting: *Deleted

## 2011-03-22 NOTE — Telephone Encounter (Signed)
Called above number and was able to obtain PA approval for Fluconazole 100 mg effective today X 1 month. Pharmacy and pt informed.

## 2011-04-30 ENCOUNTER — Telehealth: Payer: Self-pay | Admitting: *Deleted

## 2011-04-30 NOTE — Telephone Encounter (Signed)
Lab orders are in the computer I will not have results right away, however Thx

## 2011-04-30 NOTE — Telephone Encounter (Signed)
Patient has appt with Dr. Alain Marion tomorrow at 10:15 and says she thinks she needs labs before her visit.  She is asking if orders can be sent to the lab so that she can go there prior to her appt?

## 2011-05-01 ENCOUNTER — Other Ambulatory Visit (INDEPENDENT_AMBULATORY_CARE_PROVIDER_SITE_OTHER): Payer: 59

## 2011-05-01 ENCOUNTER — Ambulatory Visit (INDEPENDENT_AMBULATORY_CARE_PROVIDER_SITE_OTHER): Payer: 59 | Admitting: Internal Medicine

## 2011-05-01 ENCOUNTER — Encounter: Payer: Self-pay | Admitting: Internal Medicine

## 2011-05-01 DIAGNOSIS — E669 Obesity, unspecified: Secondary | ICD-10-CM

## 2011-05-01 DIAGNOSIS — K612 Anorectal abscess: Secondary | ICD-10-CM

## 2011-05-01 DIAGNOSIS — K219 Gastro-esophageal reflux disease without esophagitis: Secondary | ICD-10-CM

## 2011-05-01 DIAGNOSIS — E538 Deficiency of other specified B group vitamins: Secondary | ICD-10-CM

## 2011-05-01 DIAGNOSIS — M545 Low back pain: Secondary | ICD-10-CM

## 2011-05-01 DIAGNOSIS — F329 Major depressive disorder, single episode, unspecified: Secondary | ICD-10-CM

## 2011-05-01 DIAGNOSIS — G43909 Migraine, unspecified, not intractable, without status migrainosus: Secondary | ICD-10-CM

## 2011-05-01 DIAGNOSIS — N824 Other female intestinal-genital tract fistulae: Secondary | ICD-10-CM

## 2011-05-01 DIAGNOSIS — K5090 Crohn's disease, unspecified, without complications: Secondary | ICD-10-CM | POA: Diagnosis not present

## 2011-05-01 DIAGNOSIS — R404 Transient alteration of awareness: Secondary | ICD-10-CM

## 2011-05-01 DIAGNOSIS — F411 Generalized anxiety disorder: Secondary | ICD-10-CM

## 2011-05-01 DIAGNOSIS — F17200 Nicotine dependence, unspecified, uncomplicated: Secondary | ICD-10-CM

## 2011-05-01 DIAGNOSIS — Z0000 Encounter for general adult medical examination without abnormal findings: Secondary | ICD-10-CM

## 2011-05-01 LAB — SEDIMENTATION RATE: Sed Rate: 23 mm/hr — ABNORMAL HIGH (ref 0–22)

## 2011-05-01 LAB — BASIC METABOLIC PANEL
BUN: 17 mg/dL (ref 6–23)
CO2: 27 mEq/L (ref 19–32)
Calcium: 9 mg/dL (ref 8.4–10.5)
Chloride: 104 mEq/L (ref 96–112)
Creatinine, Ser: 0.9 mg/dL (ref 0.4–1.2)
GFR: 75.11 mL/min (ref >60.00)
Glucose, Bld: 85 mg/dL (ref 70–99)
Potassium: 3.8 mEq/L (ref 3.5–5.1)
Sodium: 139 mEq/L (ref 135–145)

## 2011-05-01 LAB — CBC WITH DIFFERENTIAL/PLATELET
Basophils Absolute: 0 10*3/uL (ref 0.0–0.1)
Basophils Relative: 0.4 % (ref 0.0–3.0)
Eosinophils Absolute: 0.1 10*3/uL (ref 0.0–0.7)
Eosinophils Relative: 0.8 % (ref 0.0–5.0)
HCT: 39.8 % (ref 36.0–46.0)
Hemoglobin: 13.4 g/dL (ref 12.0–15.0)
Lymphocytes Relative: 18.3 % (ref 12.0–46.0)
Lymphs Abs: 1.7 10*3/uL (ref 0.7–4.0)
MCHC: 33.7 g/dL (ref 30.0–36.0)
MCV: 82.8 fl (ref 78.0–100.0)
Monocytes Absolute: 0.4 10*3/uL (ref 0.1–1.0)
Monocytes Relative: 4.4 % (ref 3.0–12.0)
Neutro Abs: 6.9 10*3/uL (ref 1.4–7.7)
Neutrophils Relative %: 76.1 % (ref 43.0–77.0)
Platelets: 168 10*3/uL (ref 150.0–400.0)
RBC: 4.81 Mil/uL (ref 3.87–5.11)
RDW: 15.2 % — ABNORMAL HIGH (ref 11.5–14.6)
WBC: 9.1 10*3/uL (ref 4.5–10.5)

## 2011-05-01 LAB — URINALYSIS, ROUTINE W REFLEX MICROSCOPIC
Bilirubin Urine: NEGATIVE
Ketones, ur: NEGATIVE
Nitrite: NEGATIVE
Specific Gravity, Urine: >=1.03 (ref 1.000–1.030)
Total Protein, Urine: NEGATIVE
Urine Glucose: NEGATIVE
Urobilinogen, UA: 0.2 (ref 0.0–1.0)
pH: 6 (ref 5.0–8.0)

## 2011-05-01 LAB — LIPID PANEL
Cholesterol: 233 mg/dL — ABNORMAL HIGH (ref 0–200)
HDL: 32.5 mg/dL — ABNORMAL LOW (ref >39.00)
Total CHOL/HDL Ratio: 7
Triglycerides: 217 mg/dL — ABNORMAL HIGH (ref 0.0–149.0)
VLDL: 43.4 mg/dL — ABNORMAL HIGH (ref 0.0–40.0)

## 2011-05-01 LAB — HEPATIC FUNCTION PANEL
ALT: 10 U/L (ref 0–35)
AST: 10 U/L (ref 0–37)
Albumin: 4.2 g/dL (ref 3.5–5.2)
Alkaline Phosphatase: 71 U/L (ref 39–117)
Bilirubin, Direct: 0.1 mg/dL (ref 0.0–0.3)
Total Bilirubin: 0.3 mg/dL (ref 0.3–1.2)
Total Protein: 6.9 g/dL (ref 6.0–8.3)

## 2011-05-01 LAB — LDL CHOLESTEROL, DIRECT: Direct LDL: 171.9 mg/dL

## 2011-05-01 LAB — VITAMIN B12: Vitamin B-12: 964 pg/mL — ABNORMAL HIGH (ref 211–911)

## 2011-05-01 LAB — TSH: TSH: 1.09 u[IU]/mL (ref 0.35–5.50)

## 2011-05-01 MED ORDER — KETOROLAC TROMETHAMINE 10 MG PO TABS: 10.0000 mg | ORAL_TABLET | Freq: Four times a day (QID) | ORAL | Status: DC | PRN

## 2011-05-01 MED ORDER — DOXYCYCLINE HYCLATE 100 MG PO TABS: 100.0000 mg | ORAL_TABLET | Freq: Two times a day (BID) | ORAL | Status: AC

## 2011-05-01 MED ORDER — HYDROCODONE-ACETAMINOPHEN 10-650 MG PO TABS: 1.0000 | ORAL_TABLET | Freq: Four times a day (QID) | ORAL | Status: DC | PRN

## 2011-05-01 MED ORDER — ARMODAFINIL 150 MG PO TABS: 150.0000 mg | ORAL_TABLET | Freq: Every day | ORAL | Status: DC

## 2011-05-01 MED ORDER — MEPERIDINE HCL 50 MG PO TABS: 50.0000 mg | ORAL_TABLET | Freq: Every day | ORAL | Status: DC | PRN

## 2011-05-01 NOTE — Assessment & Plan Note (Signed)
Continue with current prescription therapy as reflected on the Med list.  

## 2011-05-01 NOTE — Assessment & Plan Note (Signed)
Discussed.

## 2011-05-01 NOTE — Assessment & Plan Note (Signed)
On Nuvigil

## 2011-05-01 NOTE — Progress Notes (Signed)
Patient ID: [name] [name], female   DOB: 27-Aug-1970, 41 y.o.   MRN: [id number] Patient ID: [name] [name], female   DOB: [birth year], 41 y.o.   MRN: [id number]  Subjective:    Patient ID: [name] [name], female    DOB: 07/17/1970, 41 y.o.   MRN: [id number]  HPI   The patient is here to follow up on chronic depression, anxiety, headaches and chronic moderate LBPsymptoms not controlled with medicines, diet and exercise. F/u Crohn's, L fibula fx C/o fatigue   Review of Systems  Constitutional: Positive for fatigue. Negative for chills, activity change, appetite change and unexpected weight change.  HENT: Negative for congestion, mouth sores and sinus pressure.   Eyes: Negative for visual disturbance.  Respiratory: Negative for cough and chest tightness.   Gastrointestinal: Positive for abdominal pain and blood in stool. Negative for nausea.  Genitourinary: Negative for frequency, difficulty urinating and vaginal pain.  Musculoskeletal: Positive for back pain. Negative for gait problem.  Skin: Negative for pallor and rash.  Neurological: Negative for dizziness, tremors, weakness, numbness and headaches.  Psychiatric/Behavioral: Positive for decreased concentration. Negative for suicidal ideas, confusion, sleep disturbance and dysphoric mood. The patient is nervous/anxious.        Objective:   Physical Exam  Constitutional: She appears well-developed. No distress.       obese  HENT:  Head: Normocephalic.  Right Ear: External ear normal.  Left Ear: External ear normal.  Nose: Nose normal.  Mouth/Throat: Oropharynx is clear and moist.  Eyes: Conjunctivae are normal. Pupils are equal, round, and reactive to light. Right eye exhibits no discharge. Left eye exhibits no discharge.  Neck: Normal range of motion. Neck supple. No JVD present. No tracheal deviation present. No thyromegaly present.  Cardiovascular: Normal rate, regular rhythm and normal heart sounds.   Pulmonary/Chest: No  stridor. No respiratory distress. She has no wheezes.  Abdominal: Soft. Bowel sounds are normal. She exhibits no distension and no mass. There is no tenderness. There is no rebound and no guarding.  Musculoskeletal: She exhibits tenderness (LS is tender w/ROM). She exhibits no edema.       LS/thor back is tender to palp  Lymphadenopathy:    She has no cervical adenopathy.  Neurological: She displays normal reflexes. No cranial nerve deficit. She exhibits normal muscle tone. Coordination normal.  Skin: No rash noted. No erythema.  Psychiatric: She has a normal mood and affect. Her behavior is normal. Judgment and thought content normal.   Wt Readings from Last 3 Encounters:  05/01/11 158 lb (71.668 kg)  02/27/11 158 lb (71.668 kg)  12/21/10 161 lb (73.029 kg)   BP Readings from Last 3 Encounters:  05/01/11 120/70  02/27/11 138/80  12/21/10 140/96         Assessment & Plan:

## 2011-05-01 NOTE — Assessment & Plan Note (Signed)
Off and on issues

## 2011-05-02 ENCOUNTER — Telehealth: Payer: Self-pay | Admitting: Internal Medicine

## 2011-05-02 NOTE — Telephone Encounter (Signed)
Stacey, please, inform patient that all labs are ok Thx 

## 2011-05-03 MED ORDER — ARMODAFINIL 150 MG PO TABS: 150.0000 mg | ORAL_TABLET | Freq: Every day | ORAL | Status: DC

## 2011-05-03 NOTE — Telephone Encounter (Signed)
pls mail Rx Thx

## 2011-05-03 NOTE — Telephone Encounter (Signed)
Pt informed. She states she didn't get Nuvigil Rx. Please advise.

## 2011-05-03 NOTE — Telephone Encounter (Signed)
Rx mailed.

## 2011-05-21 ENCOUNTER — Telehealth: Payer: Self-pay | Admitting: Internal Medicine

## 2011-05-21 MED ORDER — FLUCONAZOLE 100 MG PO TABS: 150.0000 mg | ORAL_TABLET | Freq: Once | ORAL | Status: DC

## 2011-05-21 NOTE — Telephone Encounter (Signed)
Ok diflucan Thx

## 2011-05-21 NOTE — Telephone Encounter (Signed)
Pt says her pharmacy/[facility] [phone] sent a fax here for diflocan  fluconazole--yeast infection--pt ph# [phone]---pt says she is really itching---

## 2011-05-22 NOTE — Telephone Encounter (Signed)
Pt advised of Rx.  

## 2011-06-22 ENCOUNTER — Other Ambulatory Visit: Payer: Self-pay

## 2011-06-22 DIAGNOSIS — K612 Anorectal abscess: Secondary | ICD-10-CM

## 2011-06-22 DIAGNOSIS — M545 Low back pain: Secondary | ICD-10-CM

## 2011-06-22 NOTE — Telephone Encounter (Signed)
Pt called requesting refills of Hydrocodone 10/650, Demerol and Toradol. Pt has appt scheduled 05/17.

## 2011-06-26 ENCOUNTER — Telehealth: Payer: Self-pay | Admitting: *Deleted

## 2011-06-26 DIAGNOSIS — K612 Anorectal abscess: Secondary | ICD-10-CM

## 2011-06-26 DIAGNOSIS — M545 Low back pain: Secondary | ICD-10-CM

## 2011-06-26 NOTE — Telephone Encounter (Signed)
Ok if time [employer]

## 2011-06-26 NOTE — Telephone Encounter (Signed)
Pt is requesting Rf on Demerol, Toradol and Lortab now. She has OV 07/06/11 but will run out before then. Please advise.

## 2011-06-28 ENCOUNTER — Other Ambulatory Visit: Payer: Self-pay | Admitting: Internal Medicine

## 2011-06-28 MED ORDER — HYDROCODONE-ACETAMINOPHEN 10-650 MG PO TABS: 1.0000 | ORAL_TABLET | Freq: Four times a day (QID) | ORAL | Status: DC | PRN

## 2011-06-28 MED ORDER — KETOROLAC TROMETHAMINE 10 MG PO TABS: 10.0000 mg | ORAL_TABLET | Freq: Four times a day (QID) | ORAL | Status: DC | PRN

## 2011-06-28 MED ORDER — MEPERIDINE HCL 50 MG PO TABS: 50.0000 mg | ORAL_TABLET | Freq: Every day | ORAL | Status: DC | PRN

## 2011-06-28 NOTE — Telephone Encounter (Signed)
Rxs ready for p/u. Pt informed

## 2011-06-28 NOTE — Telephone Encounter (Signed)
Pt wants to know the status of requested rxs. Please call pt when ready for pick up.

## 2011-06-28 NOTE — Telephone Encounter (Signed)
Pt has follow up on 07/06/11.  Pt states she takes promethazine because some of her medications cause her to be nauseated.  Promethazine last filled on 05/21/11 #60.  Please advise how many refills to give pt.

## 2011-07-06 ENCOUNTER — Encounter: Payer: Self-pay | Admitting: Internal Medicine

## 2011-07-06 ENCOUNTER — Ambulatory Visit (INDEPENDENT_AMBULATORY_CARE_PROVIDER_SITE_OTHER): Payer: 59 | Admitting: Internal Medicine

## 2011-07-06 VITALS — BP 140/90 | HR 80 | Temp 98.2°F | Resp 16 | Wt 156.0 lb

## 2011-07-06 DIAGNOSIS — F329 Major depressive disorder, single episode, unspecified: Secondary | ICD-10-CM

## 2011-07-06 DIAGNOSIS — S82409A Unspecified fracture of shaft of unspecified fibula, initial encounter for closed fracture: Secondary | ICD-10-CM

## 2011-07-06 DIAGNOSIS — E538 Deficiency of other specified B group vitamins: Secondary | ICD-10-CM | POA: Diagnosis not present

## 2011-07-06 DIAGNOSIS — K5000 Crohn's disease of small intestine without complications: Secondary | ICD-10-CM

## 2011-07-06 DIAGNOSIS — K612 Anorectal abscess: Secondary | ICD-10-CM | POA: Diagnosis not present

## 2011-07-06 DIAGNOSIS — M545 Low back pain: Secondary | ICD-10-CM

## 2011-07-06 MED ORDER — FLUCONAZOLE 100 MG PO TABS: 150.0000 mg | ORAL_TABLET | Freq: Every day | ORAL | Status: DC

## 2011-07-06 MED ORDER — FLUCONAZOLE 100 MG PO TABS: 150.0000 mg | ORAL_TABLET | Freq: Once | ORAL | Status: DC

## 2011-07-06 MED ORDER — PROMETHAZINE HCL 25 MG PO TABS: 25.0000 mg | ORAL_TABLET | Freq: Two times a day (BID) | ORAL | Status: DC | PRN

## 2011-07-06 MED ORDER — MEPERIDINE HCL 50 MG PO TABS: 50.0000 mg | ORAL_TABLET | Freq: Every day | ORAL | Status: DC | PRN

## 2011-07-06 MED ORDER — KETOROLAC TROMETHAMINE 10 MG PO TABS: 10.0000 mg | ORAL_TABLET | Freq: Four times a day (QID) | ORAL | Status: DC | PRN

## 2011-07-06 MED ORDER — CIPROFLOXACIN HCL 500 MG PO TABS: 500.0000 mg | ORAL_TABLET | Freq: Two times a day (BID) | ORAL | Status: DC

## 2011-07-06 MED ORDER — HYDROCODONE-ACETAMINOPHEN 10-650 MG PO TABS: 1.0000 | ORAL_TABLET | Freq: Four times a day (QID) | ORAL | Status: DC | PRN

## 2011-07-06 NOTE — Assessment & Plan Note (Signed)
Continue with current prescription therapy as reflected on the Med list.  

## 2011-07-06 NOTE — Assessment & Plan Note (Signed)
cipro prn

## 2011-07-06 NOTE — Assessment & Plan Note (Signed)
healed

## 2011-07-06 NOTE — Progress Notes (Signed)
   Subjective:    Patient ID: [name] [name], female    DOB: 02/25/1970, 41 y.o.   MRN: [id number]  HPI   The patient is here to follow up on chronic depression, anxiety, headaches and chronic moderate to severe LBP symptoms  controlled with medicinesand exercise most of the time. F/u Crohn's; L fibula fx - healed. C/o fatigue   Review of Systems  Constitutional: Positive for fatigue. Negative for chills, activity change, appetite change and unexpected weight change.  HENT: Negative for congestion, mouth sores and sinus pressure.   Eyes: Negative for visual disturbance.  Respiratory: Negative for cough and chest tightness.   Gastrointestinal: Positive for abdominal pain and blood in stool. Negative for nausea.  Genitourinary: Negative for frequency, difficulty urinating and vaginal pain.  Musculoskeletal: Positive for back pain. Negative for gait problem.  Skin: Negative for pallor and rash.  Neurological: Negative for dizziness, tremors, weakness, numbness and headaches.  Psychiatric/Behavioral: Positive for decreased concentration. Negative for suicidal ideas, confusion, sleep disturbance and dysphoric mood. The patient is nervous/anxious.        Objective:   Physical Exam  Constitutional: She appears well-developed. No distress.       obese  HENT:  Head: Normocephalic.  Right Ear: External ear normal.  Left Ear: External ear normal.  Nose: Nose normal.  Mouth/Throat: Oropharynx is clear and moist.  Eyes: Conjunctivae are normal. Pupils are equal, round, and reactive to light. Right eye exhibits no discharge. Left eye exhibits no discharge.  Neck: Normal range of motion. Neck supple. No JVD present. No tracheal deviation present. No thyromegaly present.  Cardiovascular: Normal rate, regular rhythm and normal heart sounds.   Pulmonary/Chest: No stridor. No respiratory distress. She has no wheezes.  Abdominal: Soft. Bowel sounds are normal. She exhibits no distension and no  mass. There is tenderness. There is no rebound and no guarding.  Musculoskeletal: She exhibits tenderness (LS is tender w/ROM). She exhibits no edema.       LS/thor back is tender to palp  Lymphadenopathy:    She has no cervical adenopathy.  Neurological: She displays normal reflexes. No cranial nerve deficit. She exhibits normal muscle tone. Coordination normal.  Skin: No rash noted. No erythema.  Psychiatric: She has a normal mood and affect. Her behavior is normal. Judgment and thought content normal.   Wt Readings from Last 3 Encounters:  07/06/11 156 lb (70.761 kg)  05/01/11 158 lb (71.668 kg)  02/27/11 158 lb (71.668 kg)   BP Readings from Last 3 Encounters:  07/06/11 140/90  05/01/11 120/70  02/27/11 138/80   Lab Results  Component Value Date   WBC 9.1 05/01/2011   HGB 13.4 05/01/2011   HCT 39.8 05/01/2011   PLT 168.0 05/01/2011   GLUCOSE 85 05/01/2011   CHOL 233* 05/01/2011   TRIG 217.0* 05/01/2011   HDL 32.50* 05/01/2011   LDLDIRECT 171.9 05/01/2011   LDLCALC 115* 03/17/2007   ALT 10 05/01/2011   AST 10 05/01/2011   NA 139 05/01/2011   K 3.8 05/01/2011   CL 104 05/01/2011   CREATININE 0.9 05/01/2011   BUN 17 05/01/2011   CO2 27 05/01/2011   TSH 1.09 05/01/2011          Assessment & Plan:

## 2011-07-09 DIAGNOSIS — F319 Bipolar disorder, unspecified: Secondary | ICD-10-CM | POA: Diagnosis not present

## 2011-07-17 ENCOUNTER — Other Ambulatory Visit: Payer: Self-pay | Admitting: *Deleted

## 2011-07-17 DIAGNOSIS — E538 Deficiency of other specified B group vitamins: Secondary | ICD-10-CM

## 2011-07-17 MED ORDER — CYANOCOBALAMIN 1000 MCG/ML IJ SOLN: INTRAMUSCULAR | Status: DC

## 2011-07-17 NOTE — Telephone Encounter (Signed)
Received fax patient states has been increase to 2 x's a month. Need new script... 07/17/11@3 :25pm/LMB

## 2011-07-17 NOTE — Telephone Encounter (Signed)
Resending b12 back to [employer].... 07/17/11@3 :27pm/LMB

## 2011-07-18 ENCOUNTER — Other Ambulatory Visit: Payer: Self-pay | Admitting: Obstetrics and Gynecology

## 2011-07-18 ENCOUNTER — Other Ambulatory Visit (HOSPITAL_COMMUNITY)
Admission: RE | Admit: 2011-07-18 | Discharge: 2011-07-18 | Disposition: A | Discharge status: Home / Self Care | Payer: 59 | Source: Ambulatory Visit | Attending: Obstetrics and Gynecology | Admitting: Obstetrics and Gynecology

## 2011-07-18 DIAGNOSIS — Z01419 Encounter for gynecological examination (general) (routine) without abnormal findings: Secondary | ICD-10-CM | POA: Insufficient documentation

## 2011-08-29 ENCOUNTER — Encounter: Payer: Self-pay | Admitting: Internal Medicine

## 2011-08-29 ENCOUNTER — Ambulatory Visit: Payer: 59 | Admitting: Internal Medicine

## 2011-08-29 ENCOUNTER — Ambulatory Visit (INDEPENDENT_AMBULATORY_CARE_PROVIDER_SITE_OTHER): Payer: 59 | Admitting: Internal Medicine

## 2011-08-29 VITALS — BP 120/90 | HR 80 | Temp 97.4°F | Resp 16 | Wt 152.0 lb

## 2011-08-29 DIAGNOSIS — F329 Major depressive disorder, single episode, unspecified: Secondary | ICD-10-CM | POA: Diagnosis not present

## 2011-08-29 DIAGNOSIS — G43909 Migraine, unspecified, not intractable, without status migrainosus: Secondary | ICD-10-CM

## 2011-08-29 DIAGNOSIS — K219 Gastro-esophageal reflux disease without esophagitis: Secondary | ICD-10-CM

## 2011-08-29 DIAGNOSIS — K612 Anorectal abscess: Secondary | ICD-10-CM | POA: Diagnosis not present

## 2011-08-29 DIAGNOSIS — M545 Low back pain: Secondary | ICD-10-CM

## 2011-08-29 DIAGNOSIS — E538 Deficiency of other specified B group vitamins: Secondary | ICD-10-CM | POA: Diagnosis not present

## 2011-08-29 DIAGNOSIS — M2550 Pain in unspecified joint: Secondary | ICD-10-CM

## 2011-08-29 MED ORDER — MEPERIDINE HCL 50 MG PO TABS: 50.0000 mg | ORAL_TABLET | Freq: Every day | ORAL | Status: DC | PRN

## 2011-08-29 MED ORDER — SUMATRIPTAN SUCCINATE 100 MG PO TABS: 100.0000 mg | ORAL_TABLET | ORAL | Status: DC | PRN

## 2011-08-29 MED ORDER — HYDROCODONE-ACETAMINOPHEN 10-650 MG PO TABS: 1.0000 | ORAL_TABLET | Freq: Four times a day (QID) | ORAL | Status: DC | PRN

## 2011-08-29 MED ORDER — KETOROLAC TROMETHAMINE 10 MG PO TABS: 10.0000 mg | ORAL_TABLET | Freq: Four times a day (QID) | ORAL | Status: DC | PRN

## 2011-08-29 NOTE — Progress Notes (Signed)
Patient ID: [name] [name], female   DOB: 01/06/1971, 41 y.o.   MRN: [id number]   Subjective:    Patient ID: [name] [name], female    DOB: 1970/10/18, 41 y.o.   MRN: [id number]  HPI   The patient is here to follow up on chronic depression, anxiety, headaches and chronic moderate to severe LBP symptoms  controlled with medicinesand exercise most of the time. F/u Crohn's. C/o fatigue   Review of Systems  Constitutional: Positive for fatigue. Negative for chills, activity change, appetite change and unexpected weight change.  HENT: Negative for congestion, mouth sores and sinus pressure.   Eyes: Negative for visual disturbance.  Respiratory: Negative for cough and chest tightness.   Gastrointestinal: Positive for abdominal pain and blood in stool. Negative for nausea.  Genitourinary: Negative for frequency, difficulty urinating and vaginal pain.  Musculoskeletal: Positive for back pain. Negative for gait problem.  Skin: Negative for pallor and rash.  Neurological: Negative for dizziness, tremors, weakness, numbness and headaches.  Psychiatric/Behavioral: Positive for decreased concentration. Negative for suicidal ideas, confusion, disturbed wake/sleep cycle and dysphoric mood. The patient is nervous/anxious.        Objective:   Physical Exam  Constitutional: She appears well-developed. No distress.       obese  HENT:  Head: Normocephalic.  Right Ear: External ear normal.  Left Ear: External ear normal.  Nose: Nose normal.  Mouth/Throat: Oropharynx is clear and moist.  Eyes: Conjunctivae are normal. Pupils are equal, round, and reactive to light. Right eye exhibits no discharge. Left eye exhibits no discharge.  Neck: Normal range of motion. Neck supple. No JVD present. No tracheal deviation present. No thyromegaly present.  Cardiovascular: Normal rate, regular rhythm and normal heart sounds.   Pulmonary/Chest: No stridor. No respiratory distress. She has no wheezes.  Abdominal:  Soft. Bowel sounds are normal. She exhibits no distension and no mass. There is tenderness. There is no rebound and no guarding.  Musculoskeletal: She exhibits tenderness (LS is tender w/ROM). She exhibits no edema.       LS/thor back is tender to palp  Lymphadenopathy:    She has no cervical adenopathy.  Neurological: She displays normal reflexes. No cranial nerve deficit. She exhibits normal muscle tone. Coordination normal.  Skin: No rash noted. No erythema.  Psychiatric: She has a normal mood and affect. Her behavior is normal. Judgment and thought content normal.   Wt Readings from Last 3 Encounters:  08/29/11 152 lb (68.947 kg)  07/06/11 156 lb (70.761 kg)  05/01/11 158 lb (71.668 kg)   BP Readings from Last 3 Encounters:  08/29/11 120/90  07/06/11 140/90  05/01/11 120/70   Lab Results  Component Value Date   WBC 9.1 05/01/2011   HGB 13.4 05/01/2011   HCT 39.8 05/01/2011   PLT 168.0 05/01/2011   GLUCOSE 85 05/01/2011   CHOL 233* 05/01/2011   TRIG 217.0* 05/01/2011   HDL 32.50* 05/01/2011   LDLDIRECT 171.9 05/01/2011   LDLCALC 115* 03/17/2007   ALT 10 05/01/2011   AST 10 05/01/2011   NA 139 05/01/2011   K 3.8 05/01/2011   CL 104 05/01/2011   CREATININE 0.9 05/01/2011   BUN 17 05/01/2011   CO2 27 05/01/2011   TSH 1.09 05/01/2011          Assessment & Plan:

## 2011-08-29 NOTE — Assessment & Plan Note (Signed)
Recurrent Continue with current prescription therapy as reflected on the Med list.

## 2011-08-29 NOTE — Assessment & Plan Note (Signed)
Continue with current prescription therapy as reflected on the Med list.  

## 2011-08-29 NOTE — Assessment & Plan Note (Signed)
Off abx now

## 2011-10-01 DIAGNOSIS — F17200 Nicotine dependence, unspecified, uncomplicated: Secondary | ICD-10-CM | POA: Diagnosis not present

## 2011-10-01 DIAGNOSIS — M47817 Spondylosis without myelopathy or radiculopathy, lumbosacral region: Secondary | ICD-10-CM | POA: Diagnosis not present

## 2011-10-04 DIAGNOSIS — M47817 Spondylosis without myelopathy or radiculopathy, lumbosacral region: Secondary | ICD-10-CM | POA: Diagnosis not present

## 2011-10-04 DIAGNOSIS — M5137 Other intervertebral disc degeneration, lumbosacral region: Secondary | ICD-10-CM | POA: Diagnosis not present

## 2011-10-17 ENCOUNTER — Ambulatory Visit (INDEPENDENT_AMBULATORY_CARE_PROVIDER_SITE_OTHER): Payer: 59 | Admitting: Internal Medicine

## 2011-10-17 ENCOUNTER — Other Ambulatory Visit (INDEPENDENT_AMBULATORY_CARE_PROVIDER_SITE_OTHER): Payer: 59

## 2011-10-17 ENCOUNTER — Encounter: Payer: Self-pay | Admitting: Internal Medicine

## 2011-10-17 VITALS — BP 130/90 | HR 76 | Temp 98.2°F | Resp 16 | Wt 157.0 lb

## 2011-10-17 DIAGNOSIS — M545 Low back pain, unspecified: Secondary | ICD-10-CM

## 2011-10-17 DIAGNOSIS — K612 Anorectal abscess: Secondary | ICD-10-CM

## 2011-10-17 DIAGNOSIS — F329 Major depressive disorder, single episode, unspecified: Secondary | ICD-10-CM

## 2011-10-17 DIAGNOSIS — G43909 Migraine, unspecified, not intractable, without status migrainosus: Secondary | ICD-10-CM

## 2011-10-17 DIAGNOSIS — F3289 Other specified depressive episodes: Secondary | ICD-10-CM

## 2011-10-17 DIAGNOSIS — K219 Gastro-esophageal reflux disease without esophagitis: Secondary | ICD-10-CM

## 2011-10-17 DIAGNOSIS — F909 Attention-deficit hyperactivity disorder, unspecified type: Secondary | ICD-10-CM | POA: Diagnosis not present

## 2011-10-17 DIAGNOSIS — E669 Obesity, unspecified: Secondary | ICD-10-CM

## 2011-10-17 DIAGNOSIS — M2550 Pain in unspecified joint: Secondary | ICD-10-CM

## 2011-10-17 DIAGNOSIS — E538 Deficiency of other specified B group vitamins: Secondary | ICD-10-CM | POA: Diagnosis not present

## 2011-10-17 DIAGNOSIS — K5090 Crohn's disease, unspecified, without complications: Secondary | ICD-10-CM

## 2011-10-17 DIAGNOSIS — E039 Hypothyroidism, unspecified: Secondary | ICD-10-CM

## 2011-10-17 LAB — BASIC METABOLIC PANEL
BUN: 19 mg/dL (ref 6–23)
CO2: 27 mEq/L (ref 19–32)
Calcium: 8.8 mg/dL (ref 8.4–10.5)
Chloride: 106 mEq/L (ref 96–112)
Creatinine, Ser: 0.9 mg/dL (ref 0.4–1.2)
GFR: 73.03 mL/min (ref >60.00)
Glucose, Bld: 95 mg/dL (ref 70–99)
Potassium: 4 mEq/L (ref 3.5–5.1)
Sodium: 139 mEq/L (ref 135–145)

## 2011-10-17 MED ORDER — KETOROLAC TROMETHAMINE 10 MG PO TABS: 10.0000 mg | ORAL_TABLET | Freq: Four times a day (QID) | ORAL | Status: DC | PRN

## 2011-10-17 MED ORDER — HYDROCODONE-ACETAMINOPHEN 10-650 MG PO TABS: 1.0000 | ORAL_TABLET | Freq: Four times a day (QID) | ORAL | Status: DC | PRN

## 2011-10-17 MED ORDER — MEPERIDINE HCL 50 MG PO TABS: 50.0000 mg | ORAL_TABLET | Freq: Every day | ORAL | Status: DC | PRN

## 2011-10-17 NOTE — Assessment & Plan Note (Signed)
Continue with current prescription therapy as reflected on the Med list.  

## 2011-10-17 NOTE — Assessment & Plan Note (Signed)
Discussed.

## 2011-10-17 NOTE — Assessment & Plan Note (Signed)
Doing fair 

## 2011-10-17 NOTE — Assessment & Plan Note (Signed)
She had a reaction from Imitrex - will d/c

## 2011-10-17 NOTE — Progress Notes (Signed)
   Subjective:    Patient ID: [name] [name], female    DOB: 06/06/1970, 41 y.o.   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 F/u Crohn's. C/o fatigue. C/o a reaction to Imitrex   Review of Systems  Constitutional: Positive for fatigue. Negative for chills, activity change, appetite change and unexpected weight change.  HENT: Negative for congestion, mouth sores and sinus pressure.   Eyes: Negative for visual disturbance.  Respiratory: Negative for cough and chest tightness.   Gastrointestinal: Positive for abdominal pain and blood in stool. Negative for nausea.  Genitourinary: Negative for frequency, difficulty urinating and vaginal pain.  Musculoskeletal: Positive for back pain. Negative for gait problem.  Skin: Negative for pallor and rash.  Neurological: Negative for dizziness, tremors, weakness, numbness and headaches.  Psychiatric/Behavioral: Positive for decreased concentration. Negative for suicidal ideas, confusion, disturbed wake/sleep cycle and dysphoric mood. The patient is nervous/anxious.        Objective:   Physical Exam  Constitutional: She appears well-developed. No distress.       obese  HENT:  Head: Normocephalic.  Right Ear: External ear normal.  Left Ear: External ear normal.  Nose: Nose normal.  Mouth/Throat: Oropharynx is clear and moist.  Eyes: Conjunctivae are normal. Pupils are equal, round, and reactive to light. Right eye exhibits no discharge. Left eye exhibits no discharge.  Neck: Normal range of motion. Neck supple. No JVD present. No tracheal deviation present. No thyromegaly present.  Cardiovascular: Normal rate, regular rhythm and normal heart sounds.   Pulmonary/Chest: No stridor. No respiratory distress. She has no wheezes.  Abdominal: Soft. Bowel sounds are normal. She exhibits no  distension and no mass. There is tenderness. There is no rebound and no guarding.  Musculoskeletal: She exhibits tenderness (LS is tender w/ROM). She exhibits no edema.       LS/thor back is tender to palp  Lymphadenopathy:    She has no cervical adenopathy.  Neurological: She displays normal reflexes. No cranial nerve deficit. She exhibits normal muscle tone. Coordination normal.  Skin: No rash noted. No erythema.  Psychiatric: She has a normal mood and affect. Her behavior is normal. Judgment and thought content normal.   Wt Readings from Last 3 Encounters:  10/17/11 157 lb (71.215 kg)  08/29/11 152 lb (68.947 kg)  07/06/11 156 lb (70.761 kg)   BP Readings from Last 3 Encounters:  10/17/11 130/90  08/29/11 120/90  07/06/11 140/90   Lab Results  Component Value Date   WBC 9.1 05/01/2011   HGB 13.4 05/01/2011   HCT 39.8 05/01/2011   PLT 168.0 05/01/2011   GLUCOSE 85 05/01/2011   CHOL 233* 05/01/2011   TRIG 217.0* 05/01/2011   HDL 32.50* 05/01/2011   LDLDIRECT 171.9 05/01/2011   LDLCALC 115* 03/17/2007   ALT 10 05/01/2011   AST 10 05/01/2011   NA 139 05/01/2011   K 3.8 05/01/2011   CL 104 05/01/2011   CREATININE 0.9 05/01/2011   BUN 17 05/01/2011   CO2 27 05/01/2011   TSH 1.09 05/01/2011          Assessment & Plan:

## 2011-10-29 ENCOUNTER — Ambulatory Visit: Payer: 59 | Admitting: Internal Medicine

## 2011-11-27 ENCOUNTER — Other Ambulatory Visit: Payer: Self-pay | Admitting: Internal Medicine

## 2011-11-27 DIAGNOSIS — Z23 Encounter for immunization: Secondary | ICD-10-CM | POA: Diagnosis not present

## 2011-11-27 NOTE — Telephone Encounter (Signed)
Ok to Rf? 

## 2011-12-10 ENCOUNTER — Other Ambulatory Visit: Payer: Self-pay | Admitting: Internal Medicine

## 2011-12-11 NOTE — Telephone Encounter (Signed)
Ok to Rf? 

## 2012-01-04 ENCOUNTER — Encounter: Payer: Self-pay | Admitting: Internal Medicine

## 2012-01-04 ENCOUNTER — Telehealth: Payer: Self-pay | Admitting: *Deleted

## 2012-01-04 ENCOUNTER — Ambulatory Visit (INDEPENDENT_AMBULATORY_CARE_PROVIDER_SITE_OTHER): Payer: 59 | Admitting: Internal Medicine

## 2012-01-04 VITALS — BP 130/90 | HR 80 | Temp 98.2°F | Resp 16 | Wt 158.0 lb

## 2012-01-04 DIAGNOSIS — F17200 Nicotine dependence, unspecified, uncomplicated: Secondary | ICD-10-CM

## 2012-01-04 DIAGNOSIS — E538 Deficiency of other specified B group vitamins: Secondary | ICD-10-CM

## 2012-01-04 DIAGNOSIS — K130 Diseases of lips: Secondary | ICD-10-CM

## 2012-01-04 DIAGNOSIS — M545 Low back pain, unspecified: Secondary | ICD-10-CM

## 2012-01-04 DIAGNOSIS — K219 Gastro-esophageal reflux disease without esophagitis: Secondary | ICD-10-CM

## 2012-01-04 DIAGNOSIS — F329 Major depressive disorder, single episode, unspecified: Secondary | ICD-10-CM

## 2012-01-04 DIAGNOSIS — F3289 Other specified depressive episodes: Secondary | ICD-10-CM

## 2012-01-04 DIAGNOSIS — K612 Anorectal abscess: Secondary | ICD-10-CM

## 2012-01-04 DIAGNOSIS — K5090 Crohn's disease, unspecified, without complications: Secondary | ICD-10-CM

## 2012-01-04 DIAGNOSIS — F411 Generalized anxiety disorder: Secondary | ICD-10-CM

## 2012-01-04 MED ORDER — HYDROCODONE-ACETAMINOPHEN 10-650 MG PO TABS: 1.0000 | ORAL_TABLET | Freq: Four times a day (QID) | ORAL | Status: DC | PRN

## 2012-01-04 MED ORDER — FLUCONAZOLE 100 MG PO TABS: 150.0000 mg | ORAL_TABLET | Freq: Every day | ORAL | Status: DC

## 2012-01-04 MED ORDER — MEPERIDINE HCL 50 MG PO TABS: 50.0000 mg | ORAL_TABLET | Freq: Every day | ORAL | Status: DC | PRN

## 2012-01-04 MED ORDER — METRONIDAZOLE 0.75 % VA GEL: 1.0000 | Freq: Two times a day (BID) | VAGINAL | Status: DC

## 2012-01-04 MED ORDER — BETAMETHASONE DIPROPIONATE AUG 0.05 % EX OINT: TOPICAL_OINTMENT | Freq: Two times a day (BID) | CUTANEOUS | Status: DC | PRN

## 2012-01-04 MED ORDER — KETOROLAC TROMETHAMINE 10 MG PO TABS: 10.0000 mg | ORAL_TABLET | Freq: Four times a day (QID) | ORAL | Status: DC | PRN

## 2012-01-04 NOTE — Progress Notes (Signed)
   Subjective:    Patient ID: [name] [name], female    DOB: [birth year], 41 y.o.   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 F/u Crohn's. C/o fatigue.   Review of Systems  Constitutional: Positive for fatigue. Negative for chills, activity change, appetite change and unexpected weight change.  HENT: Negative for congestion, mouth sores and sinus pressure.   Eyes: Negative for visual disturbance.  Respiratory: Negative for cough and chest tightness.   Gastrointestinal: Positive for abdominal pain and blood in stool. Negative for nausea.  Genitourinary: Negative for frequency, difficulty urinating and vaginal pain.  Musculoskeletal: Positive for back pain. Negative for gait problem.  Skin: Negative for pallor and rash.  Neurological: Negative for dizziness, tremors, weakness, numbness and headaches.  Psychiatric/Behavioral: Positive for decreased concentration. Negative for suicidal ideas, confusion, sleep disturbance and dysphoric mood. The patient is nervous/anxious.        Objective:   Physical Exam  Constitutional: She appears well-developed. No distress.       obese  HENT:  Head: Normocephalic.  Right Ear: External ear normal.  Left Ear: External ear normal.  Nose: Nose normal.  Mouth/Throat: Oropharynx is clear and moist.  Eyes: Conjunctivae normal are normal. Pupils are equal, round, and reactive to light. Right eye exhibits no discharge. Left eye exhibits no discharge.  Neck: Normal range of motion. Neck supple. No JVD present. No tracheal deviation present. No thyromegaly present.  Cardiovascular: Normal rate, regular rhythm and normal heart sounds.   Pulmonary/Chest: No stridor. No respiratory distress. She has no wheezes.  Abdominal: Soft. Bowel sounds are normal. She exhibits no distension and no mass. There is  tenderness. There is no rebound and no guarding.  Musculoskeletal: She exhibits tenderness (LS is tender w/ROM). She exhibits no edema.       LS/thor back is tender to palp  Lymphadenopathy:    She has no cervical adenopathy.  Neurological: She displays normal reflexes. No cranial nerve deficit. She exhibits normal muscle tone. Coordination normal.  Skin: No rash noted. No erythema.  Psychiatric: She has a normal mood and affect. Her behavior is normal. Judgment and thought content normal.   Wt Readings from Last 3 Encounters:  01/04/12 158 lb (71.668 kg)  10/17/11 157 lb (71.215 kg)  08/29/11 152 lb (68.947 kg)   BP Readings from Last 3 Encounters:  01/04/12 130/90  10/17/11 130/90  08/29/11 120/90   Lab Results  Component Value Date   WBC 9.1 05/01/2011   HGB 13.4 05/01/2011   HCT 39.8 05/01/2011   PLT 168.0 05/01/2011   GLUCOSE 95 10/17/2011   CHOL 233* 05/01/2011   TRIG 217.0* 05/01/2011   HDL 32.50* 05/01/2011   LDLDIRECT 171.9 05/01/2011   LDLCALC 115* 03/17/2007   ALT 10 05/01/2011   AST 10 05/01/2011   NA 139 10/17/2011   K 4.0 10/17/2011   CL 106 10/17/2011   CREATININE 0.9 10/17/2011   BUN 19 10/17/2011   CO2 27 10/17/2011   TSH 1.09 05/01/2011          Assessment & Plan:

## 2012-01-04 NOTE — Assessment & Plan Note (Signed)
Continue with current prescription therapy as reflected on the Med list.  

## 2012-01-04 NOTE — Telephone Encounter (Signed)
OK to fill this prescription with additional refills x1 Thank you!

## 2012-01-04 NOTE — Telephone Encounter (Signed)
Pt needs Rx for metronidazole cream for yeast infection. Please advise.

## 2012-01-07 NOTE — Telephone Encounter (Signed)
Done

## 2012-01-12 DIAGNOSIS — F319 Bipolar disorder, unspecified: Secondary | ICD-10-CM | POA: Diagnosis not present

## 2012-01-30 ENCOUNTER — Other Ambulatory Visit: Payer: Self-pay | Admitting: Internal Medicine

## 2012-02-04 ENCOUNTER — Other Ambulatory Visit: Payer: Self-pay | Admitting: Internal Medicine

## 2012-02-04 NOTE — Telephone Encounter (Signed)
Ok to Rf? 

## 2012-02-05 NOTE — Telephone Encounter (Signed)
Pt is also requesting RF Demerol and Cipro also. She has coming to see you in January. Please advise.

## 2012-03-03 ENCOUNTER — Encounter: Payer: Self-pay | Admitting: Internal Medicine

## 2012-03-03 ENCOUNTER — Ambulatory Visit (INDEPENDENT_AMBULATORY_CARE_PROVIDER_SITE_OTHER): Payer: 59 | Admitting: Internal Medicine

## 2012-03-03 ENCOUNTER — Other Ambulatory Visit (INDEPENDENT_AMBULATORY_CARE_PROVIDER_SITE_OTHER): Payer: 59

## 2012-03-03 VITALS — BP 130/90 | HR 92 | Temp 97.0°F | Wt 164.0 lb

## 2012-03-03 DIAGNOSIS — E538 Deficiency of other specified B group vitamins: Secondary | ICD-10-CM

## 2012-03-03 DIAGNOSIS — M545 Low back pain, unspecified: Secondary | ICD-10-CM

## 2012-03-03 DIAGNOSIS — R5383 Other fatigue: Secondary | ICD-10-CM

## 2012-03-03 DIAGNOSIS — R5381 Other malaise: Secondary | ICD-10-CM

## 2012-03-03 DIAGNOSIS — M129 Arthropathy, unspecified: Secondary | ICD-10-CM

## 2012-03-03 DIAGNOSIS — E669 Obesity, unspecified: Secondary | ICD-10-CM

## 2012-03-03 DIAGNOSIS — G43909 Migraine, unspecified, not intractable, without status migrainosus: Secondary | ICD-10-CM

## 2012-03-03 DIAGNOSIS — N824 Other female intestinal-genital tract fistulae: Secondary | ICD-10-CM | POA: Diagnosis not present

## 2012-03-03 DIAGNOSIS — K612 Anorectal abscess: Secondary | ICD-10-CM | POA: Diagnosis not present

## 2012-03-03 DIAGNOSIS — K5090 Crohn's disease, unspecified, without complications: Secondary | ICD-10-CM

## 2012-03-03 DIAGNOSIS — F17200 Nicotine dependence, unspecified, uncomplicated: Secondary | ICD-10-CM

## 2012-03-03 LAB — CBC WITH DIFFERENTIAL/PLATELET
Basophils Absolute: 0 10*3/uL (ref 0.0–0.1)
Basophils Relative: 0.4 % (ref 0.0–3.0)
Eosinophils Absolute: 0.1 10*3/uL (ref 0.0–0.7)
Eosinophils Relative: 0.7 % (ref 0.0–5.0)
HCT: 38.1 % (ref 36.0–46.0)
Hemoglobin: 12.6 g/dL (ref 12.0–15.0)
Lymphocytes Relative: 21.5 % (ref 12.0–46.0)
Lymphs Abs: 1.8 10*3/uL (ref 0.7–4.0)
MCHC: 33.1 g/dL (ref 30.0–36.0)
MCV: 81.5 fl (ref 78.0–100.0)
Monocytes Absolute: 0.4 10*3/uL (ref 0.1–1.0)
Monocytes Relative: 4.6 % (ref 3.0–12.0)
Neutro Abs: 6.1 10*3/uL (ref 1.4–7.7)
Neutrophils Relative %: 72.8 % (ref 43.0–77.0)
Platelets: 180 10*3/uL (ref 150.0–400.0)
RBC: 4.67 Mil/uL (ref 3.87–5.11)
RDW: 14.6 % (ref 11.5–14.6)
WBC: 8.4 10*3/uL (ref 4.5–10.5)

## 2012-03-03 LAB — BASIC METABOLIC PANEL
BUN: 13 mg/dL (ref 6–23)
CO2: 26 mEq/L (ref 19–32)
Calcium: 8.8 mg/dL (ref 8.4–10.5)
Chloride: 106 mEq/L (ref 96–112)
Creatinine, Ser: 1 mg/dL (ref 0.4–1.2)
GFR: 66.07 mL/min (ref >60.00)
Glucose, Bld: 98 mg/dL (ref 70–99)
Potassium: 4.3 mEq/L (ref 3.5–5.1)
Sodium: 138 mEq/L (ref 135–145)

## 2012-03-03 LAB — VITAMIN B12: Vitamin B-12: 589 pg/mL (ref 211–911)

## 2012-03-03 LAB — TSH: TSH: 2.07 u[IU]/mL (ref 0.35–5.50)

## 2012-03-03 MED ORDER — CIPROFLOXACIN HCL 500 MG PO TABS: 500.0000 mg | ORAL_TABLET | Freq: Two times a day (BID) | ORAL | Status: DC

## 2012-03-03 MED ORDER — KETOROLAC TROMETHAMINE 10 MG PO TABS: 10.0000 mg | ORAL_TABLET | Freq: Four times a day (QID) | ORAL | Status: DC | PRN

## 2012-03-03 MED ORDER — MEPERIDINE HCL 50 MG PO TABS: 50.0000 mg | ORAL_TABLET | Freq: Every day | ORAL | Status: DC | PRN

## 2012-03-03 MED ORDER — HYDROCODONE-ACETAMINOPHEN 10-650 MG PO TABS: 1.0000 | ORAL_TABLET | Freq: Four times a day (QID) | ORAL | Status: DC | PRN

## 2012-03-03 MED ORDER — ALPRAZOLAM 0.5 MG PO TABS: 0.5000 mg | ORAL_TABLET | Freq: Every evening | ORAL | Status: DC | PRN

## 2012-03-03 NOTE — Assessment & Plan Note (Signed)
Continue with current prescription therapy as reflected on the Med list.  

## 2012-03-03 NOTE — Progress Notes (Signed)
   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 F/u Crohn's. C/o aches,  fatigue.  BP Readings from Last 3 Encounters:  03/03/12 130/90  01/04/12 130/90  10/17/11 130/90   Wt Readings from Last 3 Encounters:  03/03/12 164 lb (74.39 kg)  01/04/12 158 lb (71.668 kg)  10/17/11 157 lb (71.215 kg)       Review of Systems  Constitutional: Positive for fatigue. Negative for chills, activity change, appetite change and unexpected weight change.  HENT: Negative for congestion, mouth sores and sinus pressure.   Eyes: Negative for visual disturbance.  Respiratory: Negative for cough and chest tightness.   Gastrointestinal: Positive for abdominal pain and blood in stool. Negative for nausea.  Genitourinary: Negative for frequency, difficulty urinating and vaginal pain.  Musculoskeletal: Positive for back pain. Negative for gait problem.  Skin: Negative for pallor and rash.  Neurological: Negative for dizziness, tremors, weakness, numbness and headaches.  Psychiatric/Behavioral: Positive for decreased concentration. Negative for suicidal ideas, confusion, sleep disturbance and dysphoric mood. The patient is nervous/anxious.        Objective:   Physical Exam  Constitutional: She appears well-developed. No distress.       obese  HENT:  Head: Normocephalic.  Right Ear: External ear normal.  Left Ear: External ear normal.  Nose: Nose normal.  Mouth/Throat: Oropharynx is clear and moist.  Eyes: Conjunctivae normal are normal. Pupils are equal, round, and reactive to light. Right eye exhibits no discharge. Left eye exhibits no discharge.  Neck: Normal range of motion. Neck supple. No JVD present. No tracheal deviation present. No thyromegaly present.  Cardiovascular: Normal rate, regular rhythm and normal heart sounds.     Pulmonary/Chest: No stridor. No respiratory distress. She has no wheezes.  Abdominal: Soft. Bowel sounds are normal. She exhibits no distension and no mass. There is tenderness. There is no rebound and no guarding.  Musculoskeletal: She exhibits tenderness (LS is tender w/ROM). She exhibits no edema.       LS/thor back is tender to palp  Lymphadenopathy:    She has no cervical adenopathy.  Neurological: She displays normal reflexes. No cranial nerve deficit. She exhibits normal muscle tone. Coordination normal.  Skin: No rash noted. No erythema.  Psychiatric: She has a normal mood and affect. Her behavior is normal. Judgment and thought content normal.     Lab Results  Component Value Date   WBC 9.1 05/01/2011   HGB 13.4 05/01/2011   HCT 39.8 05/01/2011   PLT 168.0 05/01/2011   GLUCOSE 95 10/17/2011   CHOL 233* 05/01/2011   TRIG 217.0* 05/01/2011   HDL 32.50* 05/01/2011   LDLDIRECT 171.9 05/01/2011   LDLCALC 115* 03/17/2007   ALT 10 05/01/2011   AST 10 05/01/2011   NA 139 10/17/2011   K 4.0 10/17/2011   CL 106 10/17/2011   CREATININE 0.9 10/17/2011   BUN 19 10/17/2011   CO2 27 10/17/2011   TSH 1.09 05/01/2011          Assessment & Plan:

## 2012-03-03 NOTE — Assessment & Plan Note (Signed)
Continue with current prescription therapy as reflected on the Med list. F/u w/GI 

## 2012-03-03 NOTE — Assessment & Plan Note (Signed)
Continue with current prescription therapy as reflected on the Med list. Restart  A course of cipro

## 2012-03-03 NOTE — Assessment & Plan Note (Signed)
Discussed.

## 2012-03-03 NOTE — Assessment & Plan Note (Signed)
Wt loss discussed 

## 2012-03-05 ENCOUNTER — Other Ambulatory Visit: Payer: Self-pay | Admitting: *Deleted

## 2012-03-05 MED ORDER — PROMETHAZINE HCL 25 MG PO TABS: 25.0000 mg | ORAL_TABLET | Freq: Two times a day (BID) | ORAL | Status: DC | PRN

## 2012-03-05 MED ORDER — PANTOPRAZOLE SODIUM 40 MG PO TBEC: 40.0000 mg | DELAYED_RELEASE_TABLET | Freq: Two times a day (BID) | ORAL | Status: DC

## 2012-04-07 ENCOUNTER — Telehealth: Payer: Self-pay | Admitting: Internal Medicine

## 2012-04-07 NOTE — Telephone Encounter (Signed)
Rec fax requesting to change Hydroco/APAP 10/650 1q 6 hr prn # 120 to available strength. Please advise.

## 2012-04-07 NOTE — Telephone Encounter (Signed)
Patient is requesting a call back from [name] [name] concerning her medications

## 2012-04-07 NOTE — Telephone Encounter (Signed)
Ok 10/325 Thx

## 2012-04-07 NOTE — Telephone Encounter (Signed)
Pharmacy informed.

## 2012-04-28 ENCOUNTER — Encounter: Payer: Self-pay | Admitting: Internal Medicine

## 2012-04-28 ENCOUNTER — Ambulatory Visit (INDEPENDENT_AMBULATORY_CARE_PROVIDER_SITE_OTHER): Payer: 59 | Admitting: Internal Medicine

## 2012-04-28 VITALS — BP 162/100 | HR 72 | Temp 98.0°F | Resp 16 | Wt 168.0 lb

## 2012-04-28 DIAGNOSIS — R635 Abnormal weight gain: Secondary | ICD-10-CM

## 2012-04-28 DIAGNOSIS — K612 Anorectal abscess: Secondary | ICD-10-CM

## 2012-04-28 DIAGNOSIS — E538 Deficiency of other specified B group vitamins: Secondary | ICD-10-CM

## 2012-04-28 DIAGNOSIS — R030 Elevated blood-pressure reading, without diagnosis of hypertension: Secondary | ICD-10-CM

## 2012-04-28 DIAGNOSIS — F17200 Nicotine dependence, unspecified, uncomplicated: Secondary | ICD-10-CM

## 2012-04-28 DIAGNOSIS — F3289 Other specified depressive episodes: Secondary | ICD-10-CM

## 2012-04-28 DIAGNOSIS — M545 Low back pain, unspecified: Secondary | ICD-10-CM

## 2012-04-28 DIAGNOSIS — F329 Major depressive disorder, single episode, unspecified: Secondary | ICD-10-CM | POA: Diagnosis not present

## 2012-04-28 DIAGNOSIS — F411 Generalized anxiety disorder: Secondary | ICD-10-CM

## 2012-04-28 MED ORDER — MEPERIDINE HCL 50 MG PO TABS: 50.0000 mg | ORAL_TABLET | Freq: Every day | ORAL | Status: DC | PRN

## 2012-04-28 MED ORDER — KETOROLAC TROMETHAMINE 10 MG PO TABS: 10.0000 mg | ORAL_TABLET | Freq: Four times a day (QID) | ORAL | Status: DC | PRN

## 2012-04-28 MED ORDER — HYDROCODONE-ACETAMINOPHEN 10-325 MG PO TABS: 1.0000 | ORAL_TABLET | Freq: Four times a day (QID) | ORAL | Status: DC | PRN

## 2012-04-28 NOTE — Progress Notes (Signed)
   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 F/u Crohn's. C/o aches,  fatigue.  BP Readings from Last 3 Encounters:  04/28/12 162/100  03/03/12 130/90  01/04/12 130/90   Wt Readings from Last 3 Encounters:  04/28/12 168 lb (76.204 kg)  03/03/12 164 lb (74.39 kg)  01/04/12 158 lb (71.668 kg)       Review of Systems  Constitutional: Positive for fatigue. Negative for chills, activity change, appetite change and unexpected weight change.  HENT: Negative for congestion, mouth sores and sinus pressure.   Eyes: Negative for visual disturbance.  Respiratory: Negative for cough and chest tightness.   Gastrointestinal: Positive for abdominal pain and blood in stool. Negative for nausea.  Genitourinary: Negative for frequency, difficulty urinating and vaginal pain.  Musculoskeletal: Positive for back pain. Negative for gait problem.  Skin: Negative for pallor and rash.  Neurological: Negative for dizziness, tremors, weakness, numbness and headaches.  Psychiatric/Behavioral: Positive for decreased concentration. Negative for suicidal ideas, confusion, sleep disturbance and dysphoric mood. The patient is nervous/anxious.        Objective:   Physical Exam  Constitutional: She appears well-developed. No distress.  obese  HENT:  Head: Normocephalic.  Right Ear: External ear normal.  Left Ear: External ear normal.  Nose: Nose normal.  Mouth/Throat: Oropharynx is clear and moist.  Eyes: Conjunctivae are normal. Pupils are equal, round, and reactive to light. Right eye exhibits no discharge. Left eye exhibits no discharge.  Neck: Normal range of motion. Neck supple. No JVD present. No tracheal deviation present. No thyromegaly present.  Cardiovascular: Normal rate, regular rhythm and normal heart sounds.   Pulmonary/Chest: No  stridor. No respiratory distress. She has no wheezes.  Abdominal: Soft. Bowel sounds are normal. She exhibits no distension and no mass. There is tenderness. There is no rebound and no guarding.  Musculoskeletal: She exhibits tenderness (LS is tender w/ROM). She exhibits no edema.  LS/thor back is tender to palp  Lymphadenopathy:    She has no cervical adenopathy.  Neurological: She displays normal reflexes. No cranial nerve deficit. She exhibits normal muscle tone. Coordination normal.  Skin: No rash noted. No erythema.  Psychiatric: She has a normal mood and affect. Her behavior is normal. Judgment and thought content normal.     Lab Results  Component Value Date   WBC 8.4 03/03/2012   HGB 12.6 03/03/2012   HCT 38.1 03/03/2012   PLT 180.0 03/03/2012   GLUCOSE 98 03/03/2012   CHOL 233* 05/01/2011   TRIG 217.0* 05/01/2011   HDL 32.50* 05/01/2011   LDLDIRECT 171.9 05/01/2011   LDLCALC 115* 03/17/2007   ALT 10 05/01/2011   AST 10 05/01/2011   NA 138 03/03/2012   K 4.3 03/03/2012   CL 106 03/03/2012   CREATININE 1.0 03/03/2012   BUN 13 03/03/2012   CO2 26 03/03/2012   TSH 2.07 03/03/2012          Assessment & Plan:

## 2012-04-28 NOTE — Assessment & Plan Note (Signed)
Continue with current prescription therapy as reflected on the Med list.  

## 2012-04-28 NOTE — Assessment & Plan Note (Signed)
3/14 worse NAS diet Loose wt

## 2012-04-28 NOTE — Assessment & Plan Note (Signed)
Discussed.

## 2012-04-28 NOTE — Assessment & Plan Note (Signed)
Diet discussed

## 2012-04-29 ENCOUNTER — Other Ambulatory Visit: Payer: Self-pay | Admitting: Internal Medicine

## 2012-05-23 DIAGNOSIS — M47817 Spondylosis without myelopathy or radiculopathy, lumbosacral region: Secondary | ICD-10-CM | POA: Diagnosis not present

## 2012-05-23 DIAGNOSIS — F17200 Nicotine dependence, unspecified, uncomplicated: Secondary | ICD-10-CM | POA: Diagnosis not present

## 2012-06-03 DIAGNOSIS — M47817 Spondylosis without myelopathy or radiculopathy, lumbosacral region: Secondary | ICD-10-CM | POA: Diagnosis not present

## 2012-06-09 ENCOUNTER — Encounter: Payer: Self-pay | Admitting: *Deleted

## 2012-06-23 ENCOUNTER — Ambulatory Visit: Payer: 59 | Admitting: Internal Medicine

## 2012-06-30 ENCOUNTER — Encounter: Payer: Self-pay | Admitting: Internal Medicine

## 2012-06-30 ENCOUNTER — Ambulatory Visit (INDEPENDENT_AMBULATORY_CARE_PROVIDER_SITE_OTHER): Payer: 59 | Admitting: Internal Medicine

## 2012-06-30 VITALS — BP 150/108 | HR 80 | Temp 98.4°F | Resp 16 | Wt 160.0 lb

## 2012-06-30 DIAGNOSIS — N824 Other female intestinal-genital tract fistulae: Secondary | ICD-10-CM

## 2012-06-30 DIAGNOSIS — F411 Generalized anxiety disorder: Secondary | ICD-10-CM

## 2012-06-30 DIAGNOSIS — K612 Anorectal abscess: Secondary | ICD-10-CM | POA: Diagnosis not present

## 2012-06-30 DIAGNOSIS — M545 Low back pain: Secondary | ICD-10-CM | POA: Diagnosis not present

## 2012-06-30 DIAGNOSIS — R5383 Other fatigue: Secondary | ICD-10-CM | POA: Diagnosis not present

## 2012-06-30 DIAGNOSIS — F329 Major depressive disorder, single episode, unspecified: Secondary | ICD-10-CM

## 2012-06-30 DIAGNOSIS — R5381 Other malaise: Secondary | ICD-10-CM | POA: Diagnosis not present

## 2012-06-30 DIAGNOSIS — E538 Deficiency of other specified B group vitamins: Secondary | ICD-10-CM

## 2012-06-30 MED ORDER — PANTOPRAZOLE SODIUM 40 MG PO TBEC: 40.0000 mg | DELAYED_RELEASE_TABLET | Freq: Every day | ORAL | Status: DC

## 2012-06-30 MED ORDER — MEPERIDINE HCL 50 MG PO TABS: 50.0000 mg | ORAL_TABLET | Freq: Every day | ORAL | Status: DC | PRN

## 2012-06-30 MED ORDER — HYDROCODONE-ACETAMINOPHEN 10-325 MG PO TABS: 1.0000 | ORAL_TABLET | Freq: Four times a day (QID) | ORAL | Status: DC | PRN

## 2012-06-30 MED ORDER — KETOROLAC TROMETHAMINE 10 MG PO TABS: 10.0000 mg | ORAL_TABLET | Freq: Four times a day (QID) | ORAL | Status: DC | PRN

## 2012-06-30 NOTE — Assessment & Plan Note (Signed)
On meds prn 

## 2012-06-30 NOTE — Assessment & Plan Note (Signed)
Discussed.

## 2012-06-30 NOTE — Assessment & Plan Note (Signed)
Continue with current prescription therapy as reflected on the Med list.  

## 2012-06-30 NOTE — Progress Notes (Signed)
   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 F/u Crohn's. C/o aches,  Fatigue - chronic.lastbp  BP Readings from Last 3 Encounters:  06/30/12 150/108  04/28/12 162/100  03/03/12 130/90   Wt Readings from Last 3 Encounters:  06/30/12 160 lb (72.576 kg)  04/28/12 168 lb (76.204 kg)  03/03/12 164 lb (74.39 kg)       Review of Systems  Constitutional: Positive for fatigue. Negative for chills, activity change, appetite change and unexpected weight change.  HENT: Negative for congestion, mouth sores and sinus pressure.   Eyes: Negative for visual disturbance.  Respiratory: Negative for cough and chest tightness.   Gastrointestinal: Positive for abdominal pain and blood in stool. Negative for nausea.  Genitourinary: Negative for frequency, difficulty urinating and vaginal pain.  Musculoskeletal: Positive for back pain. Negative for gait problem.  Skin: Negative for pallor and rash.  Neurological: Negative for dizziness, tremors, weakness, numbness and headaches.  Psychiatric/Behavioral: Positive for decreased concentration. Negative for suicidal ideas, confusion, sleep disturbance and dysphoric mood. The patient is nervous/anxious.        Objective:   Physical Exam  Constitutional: She appears well-developed. No distress.  obese  HENT:  Head: Normocephalic.  Right Ear: External ear normal.  Left Ear: External ear normal.  Nose: Nose normal.  Mouth/Throat: Oropharynx is clear and moist.  Eyes: Conjunctivae are normal. Pupils are equal, round, and reactive to light. Right eye exhibits no discharge. Left eye exhibits no discharge.  Neck: Normal range of motion. Neck supple. No JVD present. No tracheal deviation present. No thyromegaly present.  Cardiovascular: Normal rate, regular rhythm and normal heart sounds.    Pulmonary/Chest: No stridor. No respiratory distress. She has no wheezes.  Abdominal: Soft. Bowel sounds are normal. She exhibits no distension and no mass. There is tenderness. There is no rebound and no guarding.  Musculoskeletal: She exhibits tenderness (LS is tender w/ROM). She exhibits no edema.  LS/thor back is tender to palp  Lymphadenopathy:    She has no cervical adenopathy.  Neurological: She displays normal reflexes. No cranial nerve deficit. She exhibits normal muscle tone. Coordination normal.  Skin: No rash noted. No erythema.  Psychiatric: She has a normal mood and affect. Her behavior is normal. Judgment and thought content normal.     Lab Results  Component Value Date   WBC 8.4 03/03/2012   HGB 12.6 03/03/2012   HCT 38.1 03/03/2012   PLT 180.0 03/03/2012   GLUCOSE 98 03/03/2012   CHOL 233* 05/01/2011   TRIG 217.0* 05/01/2011   HDL 32.50* 05/01/2011   LDLDIRECT 171.9 05/01/2011   LDLCALC 115* 03/17/2007   ALT 10 05/01/2011   AST 10 05/01/2011   NA 138 03/03/2012   K 4.3 03/03/2012   CL 106 03/03/2012   CREATININE 1.0 03/03/2012   BUN 13 03/03/2012   CO2 26 03/03/2012   TSH 2.07 03/03/2012          Assessment & Plan:

## 2012-07-07 ENCOUNTER — Other Ambulatory Visit: Payer: Self-pay | Admitting: Internal Medicine

## 2012-07-07 DIAGNOSIS — F319 Bipolar disorder, unspecified: Secondary | ICD-10-CM | POA: Diagnosis not present

## 2012-07-27 ENCOUNTER — Other Ambulatory Visit: Payer: Self-pay | Admitting: Internal Medicine

## 2012-08-24 ENCOUNTER — Other Ambulatory Visit: Payer: Self-pay | Admitting: Internal Medicine

## 2012-08-25 ENCOUNTER — Encounter: Payer: Self-pay | Admitting: Obstetrics and Gynecology

## 2012-08-25 ENCOUNTER — Other Ambulatory Visit (HOSPITAL_COMMUNITY)
Admission: RE | Admit: 2012-08-25 | Discharge: 2012-08-25 | Disposition: A | Discharge status: Home / Self Care | Payer: 59 | Source: Ambulatory Visit | Attending: Obstetrics and Gynecology | Admitting: Obstetrics and Gynecology

## 2012-08-25 ENCOUNTER — Ambulatory Visit (INDEPENDENT_AMBULATORY_CARE_PROVIDER_SITE_OTHER): Payer: 59 | Admitting: Obstetrics and Gynecology

## 2012-08-25 VITALS — BP 144/100 | Ht 61.5 in | Wt 161.6 lb

## 2012-08-25 DIAGNOSIS — Z01419 Encounter for gynecological examination (general) (routine) without abnormal findings: Secondary | ICD-10-CM

## 2012-08-25 DIAGNOSIS — Z1151 Encounter for screening for human papillomavirus (HPV): Secondary | ICD-10-CM | POA: Insufficient documentation

## 2012-08-25 DIAGNOSIS — R87810 Cervical high risk human papillomavirus (HPV) DNA test positive: Secondary | ICD-10-CM | POA: Insufficient documentation

## 2012-08-25 DIAGNOSIS — B379 Candidiasis, unspecified: Secondary | ICD-10-CM

## 2012-08-25 MED ORDER — FLUCONAZOLE 150 MG PO TABS: 150.0000 mg | ORAL_TABLET | ORAL | Status: DC

## 2012-08-25 MED ORDER — GENTLECATH URINARY CATHETER MISC: 1.0000 | Status: DC | PRN

## 2012-08-25 MED ORDER — NYSTATIN-TRIAMCINOLONE 100000-0.1 UNIT/GM-% EX OINT: TOPICAL_OINTMENT | Freq: Two times a day (BID) | CUTANEOUS | Status: DC

## 2012-08-25 NOTE — Progress Notes (Signed)
  Assessment:  Normal Gyn Exam   Plan:  1. Mammogram 2. pap smear done, next pap due 3 years 3. return annually or prn  Subjective:  [name] [name] is a 42 y.o. female No obstetric history on file. who presents for annual exam.  The patient has complaints today of   The following portions of the patient's history were reviewed and updated as appropriate: allergies, current medications, past family history, past medical history, past social history, past surgical history and problem list.  Review of Systems Gastrointestinal: positive for crohns  Objective:  BP 144/100  Ht 5' 1.5" (1.562 m)  Wt 161 lb 9.6 oz (73.301 kg)  BMI 30.04 kg/m2  BMI: Body mass index is 30.04 kg/(m^2). General Appearance: Alert, appropriate appearance for age. No acute distress HEENT: Grossly normal Neck / Thyroid:  Cardiovascular: RRR; normal S1, S2, no murmur Lungs: CTA bilaterally Back: No CVAT Breast Exam: No dimpling, nipple retraction or discharge. No masses or nodes., Normal to inspection, Normal breast tissue bilaterally and No masses or nodes.No dimpling, nipple retraction or discharge. 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  Urinalysis:Not done  Mallory Shirk. MD Pgr [phone] 10:37 AM

## 2012-08-25 NOTE — Patient Instructions (Addendum)
Candidal Vulvovaginitis  Candidal vulvovaginitis is an infection of the vagina and vulva. The vulva is the skin around the opening of the vagina. This may cause itching and discomfort in and around the vagina.   HOME CARE  · Only take medicine as told by your doctor.  · Do not have sex (intercourse) until the infection is healed or as told by your doctor.  · Practice safe sex.  · Tell your sex partner about your infection.  · Do not douche or use tampons.  · Wear cotton underwear. Do not wear tight pants or panty hose.  · Eat yogurt. This may help treat and prevent yeast infections.  GET HELP RIGHT AWAY IF:   · You have a fever.  · Your problems get worse during treatment or do not get better in 3 days.  · You have discomfort, irritation, or itching in your vagina or vulva area.  · You have pain after sex.  · You start to get belly (abdominal) pain.  MAKE SURE YOU:  · Understand these instructions.  · Will watch your condition.  · Will get help right away if you are not doing well or get worse.  Document Released: 05/04/2008 Document Revised: 04/30/2011 Document Reviewed: 05/04/2008  [employer]® Patient Information ©2014 [employer].

## 2012-08-26 ENCOUNTER — Telehealth: Payer: Self-pay | Admitting: Obstetrics and Gynecology

## 2012-08-26 DIAGNOSIS — B379 Candidiasis, unspecified: Secondary | ICD-10-CM

## 2012-08-26 MED ORDER — METRONIDAZOLE 0.75 % VA GEL: 1.0000 | Freq: Two times a day (BID) | VAGINAL | Status: DC

## 2012-08-26 NOTE — Telephone Encounter (Signed)
Pt to get rx's at [facility] for catheters.

## 2012-08-28 ENCOUNTER — Other Ambulatory Visit: Payer: Self-pay | Admitting: Internal Medicine

## 2012-09-02 ENCOUNTER — Ambulatory Visit (INDEPENDENT_AMBULATORY_CARE_PROVIDER_SITE_OTHER): Payer: 59 | Admitting: Internal Medicine

## 2012-09-02 ENCOUNTER — Encounter: Payer: Self-pay | Admitting: Internal Medicine

## 2012-09-02 ENCOUNTER — Other Ambulatory Visit (INDEPENDENT_AMBULATORY_CARE_PROVIDER_SITE_OTHER): Payer: 59

## 2012-09-02 VITALS — BP 145/90 | HR 80 | Temp 98.5°F | Resp 16 | Wt 163.0 lb

## 2012-09-02 DIAGNOSIS — R5381 Other malaise: Secondary | ICD-10-CM

## 2012-09-02 DIAGNOSIS — R635 Abnormal weight gain: Secondary | ICD-10-CM

## 2012-09-02 DIAGNOSIS — R5383 Other fatigue: Secondary | ICD-10-CM

## 2012-09-02 DIAGNOSIS — N824 Other female intestinal-genital tract fistulae: Secondary | ICD-10-CM | POA: Diagnosis not present

## 2012-09-02 DIAGNOSIS — E538 Deficiency of other specified B group vitamins: Secondary | ICD-10-CM

## 2012-09-02 DIAGNOSIS — K612 Anorectal abscess: Secondary | ICD-10-CM

## 2012-09-02 DIAGNOSIS — M2550 Pain in unspecified joint: Secondary | ICD-10-CM

## 2012-09-02 DIAGNOSIS — F329 Major depressive disorder, single episode, unspecified: Secondary | ICD-10-CM

## 2012-09-02 DIAGNOSIS — I10 Essential (primary) hypertension: Secondary | ICD-10-CM | POA: Insufficient documentation

## 2012-09-02 DIAGNOSIS — K5090 Crohn's disease, unspecified, without complications: Secondary | ICD-10-CM | POA: Diagnosis not present

## 2012-09-02 DIAGNOSIS — M545 Low back pain: Secondary | ICD-10-CM | POA: Diagnosis not present

## 2012-09-02 DIAGNOSIS — F411 Generalized anxiety disorder: Secondary | ICD-10-CM

## 2012-09-02 DIAGNOSIS — R030 Elevated blood-pressure reading, without diagnosis of hypertension: Secondary | ICD-10-CM

## 2012-09-02 LAB — BASIC METABOLIC PANEL
BUN: 19 mg/dL (ref 6–23)
CO2: 27 mEq/L (ref 19–32)
Calcium: 9 mg/dL (ref 8.4–10.5)
Chloride: 106 mEq/L (ref 96–112)
Creatinine, Ser: 0.9 mg/dL (ref 0.4–1.2)
GFR: 72.72 mL/min (ref >60.00)
Glucose, Bld: 96 mg/dL (ref 70–99)
Potassium: 3.7 mEq/L (ref 3.5–5.1)
Sodium: 139 mEq/L (ref 135–145)

## 2012-09-02 LAB — HEPATIC FUNCTION PANEL
ALT: 12 U/L (ref 0–35)
AST: 11 U/L (ref 0–37)
Albumin: 4.1 g/dL (ref 3.5–5.2)
Alkaline Phosphatase: 67 U/L (ref 39–117)
Bilirubin, Direct: 0 mg/dL (ref 0.0–0.3)
Total Bilirubin: 0.2 mg/dL — ABNORMAL LOW (ref 0.3–1.2)
Total Protein: 7.1 g/dL (ref 6.0–8.3)

## 2012-09-02 LAB — CBC WITH DIFFERENTIAL/PLATELET
Basophils Absolute: 0 10*3/uL (ref 0.0–0.1)
Basophils Relative: 0.3 % (ref 0.0–3.0)
Eosinophils Absolute: 0.1 10*3/uL (ref 0.0–0.7)
Eosinophils Relative: 0.8 % (ref 0.0–5.0)
HCT: 38.2 % (ref 36.0–46.0)
Hemoglobin: 12.9 g/dL (ref 12.0–15.0)
Lymphocytes Relative: 19.9 % (ref 12.0–46.0)
Lymphs Abs: 1.5 10*3/uL (ref 0.7–4.0)
MCHC: 33.8 g/dL (ref 30.0–36.0)
MCV: 81.6 fl (ref 78.0–100.0)
Monocytes Absolute: 0.3 10*3/uL (ref 0.1–1.0)
Monocytes Relative: 4 % (ref 3.0–12.0)
Neutro Abs: 5.9 10*3/uL (ref 1.4–7.7)
Neutrophils Relative %: 75 % (ref 43.0–77.0)
Platelets: 183 10*3/uL (ref 150.0–400.0)
RBC: 4.69 Mil/uL (ref 3.87–5.11)
RDW: 15.1 % — ABNORMAL HIGH (ref 11.5–14.6)
WBC: 7.8 10*3/uL (ref 4.5–10.5)

## 2012-09-02 LAB — VITAMIN B12: Vitamin B-12: 524 pg/mL (ref 211–911)

## 2012-09-02 LAB — SEDIMENTATION RATE: Sed Rate: 21 mm/hr (ref 0–22)

## 2012-09-02 MED ORDER — MEPERIDINE HCL 50 MG PO TABS: 50.0000 mg | ORAL_TABLET | Freq: Every day | ORAL | Status: DC | PRN

## 2012-09-02 MED ORDER — "SYRINGE/NEEDLE (DISP) 25G X 5/8"" 3 ML MISC": Status: DC

## 2012-09-02 MED ORDER — HYDROCODONE-ACETAMINOPHEN 10-325 MG PO TABS: 1.0000 | ORAL_TABLET | Freq: Four times a day (QID) | ORAL | Status: DC | PRN

## 2012-09-02 MED ORDER — BENAZEPRIL HCL 10 MG PO TABS: 10.0000 mg | ORAL_TABLET | Freq: Every day | ORAL | Status: DC

## 2012-09-02 MED ORDER — CYANOCOBALAMIN 1000 MCG/ML IJ SOLN: 1000.0000 ug | INTRAMUSCULAR | Status: DC

## 2012-09-02 MED ORDER — KETOROLAC TROMETHAMINE 10 MG PO TABS: 10.0000 mg | ORAL_TABLET | Freq: Four times a day (QID) | ORAL | Status: DC | PRN

## 2012-09-02 NOTE — Assessment & Plan Note (Signed)
Continue with current prescription therapy as reflected on the Med list.  

## 2012-09-02 NOTE — Assessment & Plan Note (Signed)
Start  Lotensin BP Readings from Last 3 Encounters:  09/02/12 145/90  08/25/12 144/100  06/30/12 150/108

## 2012-09-02 NOTE — Progress Notes (Signed)
   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 F/u Crohn's. C/o aches,  Fatigue - chronic.lastbp  BP Readings from Last 3 Encounters:  09/02/12 170/100  08/25/12 144/100  06/30/12 150/108   Wt Readings from Last 3 Encounters:  09/02/12 163 lb (73.936 kg)  08/25/12 161 lb 9.6 oz (73.301 kg)  06/30/12 160 lb (72.576 kg)       Review of Systems  Constitutional: Positive for fatigue. Negative for chills, activity change, appetite change and unexpected weight change.  HENT: Negative for congestion, mouth sores and sinus pressure.   Eyes: Negative for visual disturbance.  Respiratory: Negative for cough and chest tightness.   Gastrointestinal: Positive for abdominal pain and blood in stool. Negative for nausea.  Genitourinary: Negative for frequency, difficulty urinating and vaginal pain.  Musculoskeletal: Positive for back pain. Negative for gait problem.  Skin: Negative for pallor and rash.  Neurological: Negative for dizziness, tremors, weakness, numbness and headaches.  Psychiatric/Behavioral: Positive for decreased concentration. Negative for suicidal ideas, confusion, sleep disturbance and dysphoric mood. The patient is nervous/anxious.        Objective:   Physical Exam  Constitutional: She appears well-developed. No distress.  obese  HENT:  Head: Normocephalic.  Right Ear: External ear normal.  Left Ear: External ear normal.  Nose: Nose normal.  Mouth/Throat: Oropharynx is clear and moist.  Eyes: Conjunctivae are normal. Pupils are equal, round, and reactive to light. Right eye exhibits no discharge. Left eye exhibits no discharge.  Neck: Normal range of motion. Neck supple. No JVD present. No tracheal deviation present. No thyromegaly present.  Cardiovascular: Normal rate, regular rhythm and normal heart sounds.    Pulmonary/Chest: No stridor. No respiratory distress. She has no wheezes.  Abdominal: Soft. Bowel sounds are normal. She exhibits no distension and no mass. There is tenderness. There is no rebound and no guarding.  Musculoskeletal: She exhibits tenderness (LS is tender w/ROM). She exhibits no edema.  LS/thor back is tender to palp  Lymphadenopathy:    She has no cervical adenopathy.  Neurological: She displays normal reflexes. No cranial nerve deficit. She exhibits normal muscle tone. Coordination normal.  Skin: No rash noted. No erythema.  Psychiatric: She has a normal mood and affect. Her behavior is normal. Judgment and thought content normal.     Lab Results  Component Value Date   WBC 8.4 03/03/2012   HGB 12.6 03/03/2012   HCT 38.1 03/03/2012   PLT 180.0 03/03/2012   GLUCOSE 98 03/03/2012   CHOL 233* 05/01/2011   TRIG 217.0* 05/01/2011   HDL 32.50* 05/01/2011   LDLDIRECT 171.9 05/01/2011   LDLCALC 115* 03/17/2007   ALT 10 05/01/2011   AST 10 05/01/2011   NA 138 03/03/2012   K 4.3 03/03/2012   CL 106 03/03/2012   CREATININE 1.0 03/03/2012   BUN 13 03/03/2012   CO2 26 03/03/2012   TSH 2.07 03/03/2012          Assessment & Plan:

## 2012-09-02 NOTE — Assessment & Plan Note (Signed)
Wt Readings from Last 3 Encounters:  09/02/12 163 lb (73.936 kg)  08/25/12 161 lb 9.6 oz (73.301 kg)  06/30/12 160 lb (72.576 kg)

## 2012-09-02 NOTE — Assessment & Plan Note (Signed)
Will re-check: 145/90  BP Readings from Last 3 Encounters:  09/02/12 170/100  08/25/12 144/100  06/30/12 150/108

## 2012-09-03 LAB — DRUG SCREEN, URINE
Amphetamine Screen, Ur: NEGATIVE
Barbiturate Quant, Ur: NEGATIVE
Benzodiazepines.: POSITIVE — AB
Cocaine Metabolites: NEGATIVE
Creatinine,U: 269.31 mg/dL
Marijuana Metabolite: NEGATIVE
Methadone: NEGATIVE
Opiates: POSITIVE — AB
Phencyclidine (PCP): NEGATIVE
Propoxyphene: NEGATIVE

## 2012-09-05 ENCOUNTER — Telehealth: Payer: Self-pay | Admitting: *Deleted

## 2012-09-05 NOTE — Telephone Encounter (Signed)
Spoke with [name] [name] at [facility]. They have catheters but they are a private pay item. They are $2.04 each. Spoke with pt and made her aware of this. She will send her husband there to pick up. JSY

## 2012-11-03 ENCOUNTER — Ambulatory Visit (INDEPENDENT_AMBULATORY_CARE_PROVIDER_SITE_OTHER): Payer: 59 | Admitting: Internal Medicine

## 2012-11-03 ENCOUNTER — Encounter: Payer: Self-pay | Admitting: Internal Medicine

## 2012-11-03 VITALS — BP 160/100 | HR 80 | Temp 97.3°F | Resp 16 | Wt 164.0 lb

## 2012-11-03 DIAGNOSIS — K612 Anorectal abscess: Secondary | ICD-10-CM | POA: Diagnosis not present

## 2012-11-03 DIAGNOSIS — M545 Low back pain: Secondary | ICD-10-CM | POA: Diagnosis not present

## 2012-11-03 DIAGNOSIS — E538 Deficiency of other specified B group vitamins: Secondary | ICD-10-CM

## 2012-11-03 DIAGNOSIS — I10 Essential (primary) hypertension: Secondary | ICD-10-CM

## 2012-11-03 DIAGNOSIS — Z23 Encounter for immunization: Secondary | ICD-10-CM

## 2012-11-03 DIAGNOSIS — E669 Obesity, unspecified: Secondary | ICD-10-CM

## 2012-11-03 MED ORDER — KETOROLAC TROMETHAMINE 10 MG PO TABS: 10.0000 mg | ORAL_TABLET | Freq: Four times a day (QID) | ORAL | Status: DC | PRN

## 2012-11-03 MED ORDER — LORCASERIN HCL 10 MG PO TABS: 1.0000 | ORAL_TABLET | Freq: Two times a day (BID) | ORAL | Status: DC

## 2012-11-03 MED ORDER — MEPERIDINE HCL 50 MG PO TABS: 50.0000 mg | ORAL_TABLET | Freq: Every day | ORAL | Status: DC | PRN

## 2012-11-03 MED ORDER — HYDROCODONE-ACETAMINOPHEN 10-325 MG PO TABS: 1.0000 | ORAL_TABLET | Freq: Four times a day (QID) | ORAL | Status: DC | PRN

## 2012-11-03 MED ORDER — CYANOCOBALAMIN 1000 MCG/ML IJ SOLN: 1000.0000 ug | INTRAMUSCULAR | Status: DC

## 2012-11-03 NOTE — Assessment & Plan Note (Signed)
Continue with current prescription therapy as reflected on the Med list. Loose wt 

## 2012-11-03 NOTE — Patient Instructions (Addendum)
Gluten free trial (no wheat products) for 4-6 weeks. OK to use gluten-free bread and gluten-free pasta.  Milk free trial (no milk, ice cream, cheese and yogurt) for 4-6 weeks. OK to use almond, coconut, rice or soy milk. "Almond breeze" brand tastes good.  

## 2012-11-03 NOTE — Assessment & Plan Note (Signed)
Not taking Lotensin - pls start NAS Wt loss

## 2012-11-03 NOTE — Progress Notes (Signed)
   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 F/u Crohn's.  C/o aches,  Fatigue - chronic F/u HTN - not taking Lotensin  BP Readings from Last 3 Encounters:  11/03/12 160/100  09/02/12 145/90  08/25/12 144/100   Wt Readings from Last 3 Encounters:  11/03/12 164 lb (74.39 kg)  09/02/12 163 lb (73.936 kg)  08/25/12 161 lb 9.6 oz (73.301 kg)       Review of Systems  Constitutional: Positive for fatigue. Negative for chills, activity change, appetite change and unexpected weight change.  HENT: Negative for congestion, mouth sores and sinus pressure.   Eyes: Negative for visual disturbance.  Respiratory: Negative for cough and chest tightness.   Gastrointestinal: Positive for abdominal pain and blood in stool. Negative for nausea.  Genitourinary: Negative for frequency, difficulty urinating and vaginal pain.  Musculoskeletal: Positive for back pain. Negative for gait problem.  Skin: Negative for pallor and rash.  Neurological: Negative for dizziness, tremors, weakness, numbness and headaches.  Psychiatric/Behavioral: Positive for decreased concentration. Negative for suicidal ideas, confusion, sleep disturbance and dysphoric mood. The patient is nervous/anxious.        Objective:   Physical Exam  Constitutional: She appears well-developed. No distress.  obese  HENT:  Head: Normocephalic.  Right Ear: External ear normal.  Left Ear: External ear normal.  Nose: Nose normal.  Mouth/Throat: Oropharynx is clear and moist.  Eyes: Conjunctivae are normal. Pupils are equal, round, and reactive to light. Right eye exhibits no discharge. Left eye exhibits no discharge.  Neck: Normal range of motion. Neck supple. No JVD present. No tracheal deviation present. No thyromegaly present.  Cardiovascular: Normal rate, regular rhythm and  normal heart sounds.   Pulmonary/Chest: No stridor. No respiratory distress. She has no wheezes.  Abdominal: Soft. Bowel sounds are normal. She exhibits no distension and no mass. There is tenderness. There is no rebound and no guarding.  Musculoskeletal: She exhibits tenderness (LS is tender w/ROM). She exhibits no edema.  LS/thor back is tender to palp  Lymphadenopathy:    She has no cervical adenopathy.  Neurological: She displays normal reflexes. No cranial nerve deficit. She exhibits normal muscle tone. Coordination normal.  Skin: No rash noted. No erythema.  Psychiatric: She has a normal mood and affect. Her behavior is normal. Judgment and thought content normal.     Lab Results  Component Value Date   WBC 8.4 03/03/2012   HGB 12.6 03/03/2012   HCT 38.1 03/03/2012   PLT 180.0 03/03/2012   GLUCOSE 98 03/03/2012   CHOL 233* 05/01/2011   TRIG 217.0* 05/01/2011   HDL 32.50* 05/01/2011   LDLDIRECT 171.9 05/01/2011   LDLCALC 115* 03/17/2007   ALT 10 05/01/2011   AST 10 05/01/2011   NA 138 03/03/2012   K 4.3 03/03/2012   CL 106 03/03/2012   CREATININE 1.0 03/03/2012   BUN 13 03/03/2012   CO2 26 03/03/2012   TSH 2.07 03/03/2012          Assessment & Plan:

## 2012-11-03 NOTE — Assessment & Plan Note (Signed)
Continue with current prescription therapy as reflected on the Med list.  

## 2012-11-03 NOTE — Assessment & Plan Note (Signed)
Will try Belviq Wt Readings from Last 3 Encounters:  11/03/12 164 lb (74.39 kg)  09/02/12 163 lb (73.936 kg)  08/25/12 161 lb 9.6 oz (73.301 kg)

## 2012-11-24 DIAGNOSIS — M62838 Other muscle spasm: Secondary | ICD-10-CM | POA: Diagnosis not present

## 2012-11-24 DIAGNOSIS — M25519 Pain in unspecified shoulder: Secondary | ICD-10-CM | POA: Diagnosis not present

## 2012-11-24 DIAGNOSIS — M542 Cervicalgia: Secondary | ICD-10-CM | POA: Diagnosis not present

## 2012-11-26 ENCOUNTER — Other Ambulatory Visit: Payer: Self-pay | Admitting: *Deleted

## 2012-11-26 MED ORDER — BENAZEPRIL HCL 10 MG PO TABS: 10.0000 mg | ORAL_TABLET | Freq: Every day | ORAL | Status: DC

## 2012-12-03 DIAGNOSIS — M542 Cervicalgia: Secondary | ICD-10-CM | POA: Diagnosis not present

## 2012-12-08 ENCOUNTER — Other Ambulatory Visit: Payer: Self-pay | Admitting: Internal Medicine

## 2012-12-22 ENCOUNTER — Other Ambulatory Visit: Payer: Self-pay | Admitting: Obstetrics & Gynecology

## 2012-12-22 MED ORDER — NORETHINDRONE ACETATE 5 MG PO TABS: ORAL_TABLET | ORAL | Status: DC

## 2012-12-22 NOTE — Addendum Note (Signed)
Addended by: Florian Buff on: 12/22/2012 03:58 PM   Modules accepted: Orders

## 2012-12-29 ENCOUNTER — Other Ambulatory Visit (INDEPENDENT_AMBULATORY_CARE_PROVIDER_SITE_OTHER): Payer: 59

## 2012-12-29 ENCOUNTER — Encounter: Payer: Self-pay | Admitting: Internal Medicine

## 2012-12-29 ENCOUNTER — Ambulatory Visit (INDEPENDENT_AMBULATORY_CARE_PROVIDER_SITE_OTHER): Payer: 59 | Admitting: Internal Medicine

## 2012-12-29 VITALS — BP 150/90 | HR 84 | Resp 16 | Wt 169.0 lb

## 2012-12-29 DIAGNOSIS — Z23 Encounter for immunization: Secondary | ICD-10-CM

## 2012-12-29 DIAGNOSIS — E538 Deficiency of other specified B group vitamins: Secondary | ICD-10-CM

## 2012-12-29 DIAGNOSIS — F329 Major depressive disorder, single episode, unspecified: Secondary | ICD-10-CM | POA: Diagnosis not present

## 2012-12-29 DIAGNOSIS — F411 Generalized anxiety disorder: Secondary | ICD-10-CM

## 2012-12-29 DIAGNOSIS — K612 Anorectal abscess: Secondary | ICD-10-CM

## 2012-12-29 DIAGNOSIS — K5090 Crohn's disease, unspecified, without complications: Secondary | ICD-10-CM

## 2012-12-29 DIAGNOSIS — IMO0001 Reserved for inherently not codable concepts without codable children: Secondary | ICD-10-CM

## 2012-12-29 DIAGNOSIS — R030 Elevated blood-pressure reading, without diagnosis of hypertension: Secondary | ICD-10-CM

## 2012-12-29 DIAGNOSIS — F3289 Other specified depressive episodes: Secondary | ICD-10-CM

## 2012-12-29 DIAGNOSIS — R635 Abnormal weight gain: Secondary | ICD-10-CM

## 2012-12-29 LAB — BASIC METABOLIC PANEL
BUN: 17 mg/dL (ref 6–23)
CO2: 25 mEq/L (ref 19–32)
Calcium: 8.8 mg/dL (ref 8.4–10.5)
Chloride: 107 mEq/L (ref 96–112)
Creatinine, Ser: 0.9 mg/dL (ref 0.4–1.2)
GFR: 71.69 mL/min (ref >60.00)
Glucose, Bld: 78 mg/dL (ref 70–99)
Potassium: 3.8 mEq/L (ref 3.5–5.1)
Sodium: 138 mEq/L (ref 135–145)

## 2012-12-29 LAB — HEPATIC FUNCTION PANEL
ALT: 14 U/L (ref 0–35)
AST: 15 U/L (ref 0–37)
Albumin: 3.9 g/dL (ref 3.5–5.2)
Alkaline Phosphatase: 57 U/L (ref 39–117)
Bilirubin, Direct: 0.1 mg/dL (ref 0.0–0.3)
Total Bilirubin: 0.3 mg/dL (ref 0.3–1.2)
Total Protein: 6.6 g/dL (ref 6.0–8.3)

## 2012-12-29 MED ORDER — LORCASERIN HCL 10 MG PO TABS: 1.0000 | ORAL_TABLET | Freq: Two times a day (BID) | ORAL | Status: DC

## 2012-12-29 MED ORDER — HYDROCODONE-ACETAMINOPHEN 10-325 MG PO TABS: 1.0000 | ORAL_TABLET | Freq: Four times a day (QID) | ORAL | Status: DC | PRN

## 2012-12-29 MED ORDER — MEPERIDINE HCL 50 MG PO TABS: 50.0000 mg | ORAL_TABLET | Freq: Every day | ORAL | Status: DC | PRN

## 2012-12-29 MED ORDER — KETOROLAC TROMETHAMINE 10 MG PO TABS: 10.0000 mg | ORAL_TABLET | Freq: Four times a day (QID) | ORAL | Status: DC | PRN

## 2012-12-29 NOTE — Assessment & Plan Note (Signed)
Continue with current prescription therapy as reflected on the Med list.  

## 2012-12-29 NOTE — Assessment & Plan Note (Signed)
Worse  Risks associated with treatment noncompliance were discussed. Compliance was encouraged. Re-start Rx

## 2012-12-29 NOTE — Progress Notes (Signed)
Pre-visit discussion using our clinic review tool. No additional management support is needed unless otherwise documented below in the visit note.  

## 2012-12-29 NOTE — Progress Notes (Signed)
   Subjective:    HPI  C/o dentures problems - they need to be re-fitted  The patient is here to follow up on chronic depression, anxiety, headaches and chronic moderate to severe LBP symptoms  controlled with medicinesand exercise most of the time. F/u Crohn's.  C/o aches, fatigue - chronic F/u HTN - still not taking Lotensin  BP Readings from Last 3 Encounters:  12/29/12 150/90  11/03/12 160/100  09/02/12 145/90   Wt Readings from Last 3 Encounters:  12/29/12 169 lb (76.658 kg)  11/03/12 164 lb (74.39 kg)  09/02/12 163 lb (73.936 kg)       Review of Systems  Constitutional: Positive for fatigue. Negative for chills, activity change, appetite change and unexpected weight change.  HENT: Negative for congestion, mouth sores and sinus pressure.   Eyes: Negative for visual disturbance.  Respiratory: Negative for cough and chest tightness.   Gastrointestinal: Positive for abdominal pain and blood in stool. Negative for nausea.  Genitourinary: Negative for frequency, difficulty urinating and vaginal pain.  Musculoskeletal: Positive for back pain. Negative for gait problem.  Skin: Negative for pallor and rash.  Neurological: Negative for dizziness, tremors, weakness, numbness and headaches.  Psychiatric/Behavioral: Positive for decreased concentration. Negative for suicidal ideas, confusion, sleep disturbance and dysphoric mood. The patient is nervous/anxious.        Objective:   Physical Exam  Constitutional: She appears well-developed. No distress.  obese  HENT:  Head: Normocephalic.  Right Ear: External ear normal.  Left Ear: External ear normal.  Nose: Nose normal.  Mouth/Throat: Oropharynx is clear and moist.  Eyes: Conjunctivae are normal. Pupils are equal, round, and reactive to light. Right eye exhibits no discharge. Left eye exhibits no discharge.  Neck: Normal range of motion. Neck supple. No JVD present. No tracheal deviation present. No thyromegaly present.   Cardiovascular: Normal rate, regular rhythm and normal heart sounds.   Pulmonary/Chest: No stridor. No respiratory distress. She has no wheezes.  Abdominal: Soft. Bowel sounds are normal. She exhibits no distension and no mass. There is tenderness. There is no rebound and no guarding.  Musculoskeletal: She exhibits tenderness (LS is tender w/ROM). She exhibits no edema.  LS/thor back is tender to palp  Lymphadenopathy:    She has no cervical adenopathy.  Neurological: She displays normal reflexes. No cranial nerve deficit. She exhibits normal muscle tone. Coordination normal.  Skin: No rash noted. No erythema.  Psychiatric: She has a normal mood and affect. Her behavior is normal. Judgment and thought content normal.  Edentulous   Lab Results  Component Value Date   WBC 8.4 03/03/2012   HGB 12.6 03/03/2012   HCT 38.1 03/03/2012   PLT 180.0 03/03/2012   GLUCOSE 98 03/03/2012   CHOL 233* 05/01/2011   TRIG 217.0* 05/01/2011   HDL 32.50* 05/01/2011   LDLDIRECT 171.9 05/01/2011   LDLCALC 115* 03/17/2007   ALT 10 05/01/2011   AST 10 05/01/2011   NA 138 03/03/2012   K 4.3 03/03/2012   CL 106 03/03/2012   CREATININE 1.0 03/03/2012   BUN 13 03/03/2012   CO2 26 03/03/2012   TSH 2.07 03/03/2012          Assessment & Plan:

## 2012-12-29 NOTE — Assessment & Plan Note (Signed)
Worse  Discussed

## 2013-01-18 ENCOUNTER — Other Ambulatory Visit: Payer: Self-pay | Admitting: Internal Medicine

## 2013-01-24 ENCOUNTER — Other Ambulatory Visit: Payer: Self-pay | Admitting: Internal Medicine

## 2013-01-26 NOTE — Telephone Encounter (Signed)
Refill done.  

## 2013-03-02 ENCOUNTER — Other Ambulatory Visit: Payer: 59

## 2013-03-02 ENCOUNTER — Encounter: Payer: Self-pay | Admitting: Internal Medicine

## 2013-03-02 ENCOUNTER — Ambulatory Visit (INDEPENDENT_AMBULATORY_CARE_PROVIDER_SITE_OTHER): Payer: 59 | Admitting: Internal Medicine

## 2013-03-02 VITALS — BP 146/60 | HR 80 | Resp 16 | Wt 166.0 lb

## 2013-03-02 DIAGNOSIS — K5090 Crohn's disease, unspecified, without complications: Secondary | ICD-10-CM | POA: Diagnosis not present

## 2013-03-02 DIAGNOSIS — Z23 Encounter for immunization: Secondary | ICD-10-CM | POA: Diagnosis not present

## 2013-03-02 DIAGNOSIS — F411 Generalized anxiety disorder: Secondary | ICD-10-CM

## 2013-03-02 DIAGNOSIS — M545 Low back pain, unspecified: Secondary | ICD-10-CM

## 2013-03-02 DIAGNOSIS — E538 Deficiency of other specified B group vitamins: Secondary | ICD-10-CM | POA: Diagnosis not present

## 2013-03-02 DIAGNOSIS — K612 Anorectal abscess: Secondary | ICD-10-CM | POA: Diagnosis not present

## 2013-03-02 DIAGNOSIS — F319 Bipolar disorder, unspecified: Secondary | ICD-10-CM | POA: Diagnosis not present

## 2013-03-02 MED ORDER — HYDROCODONE-ACETAMINOPHEN 10-325 MG PO TABS: 1.0000 | ORAL_TABLET | Freq: Four times a day (QID) | ORAL | Status: DC | PRN

## 2013-03-02 MED ORDER — BENAZEPRIL HCL 10 MG PO TABS: ORAL_TABLET | ORAL | Status: DC

## 2013-03-02 MED ORDER — MEPERIDINE HCL 50 MG PO TABS: 50.0000 mg | ORAL_TABLET | Freq: Every day | ORAL | Status: DC | PRN

## 2013-03-02 MED ORDER — KETOROLAC TROMETHAMINE 10 MG PO TABS: 10.0000 mg | ORAL_TABLET | Freq: Four times a day (QID) | ORAL | Status: DC | PRN

## 2013-03-02 MED ORDER — BENAZEPRIL-HYDROCHLOROTHIAZIDE 20-12.5 MG PO TABS: 1.0000 | ORAL_TABLET | Freq: Every day | ORAL | Status: DC

## 2013-03-02 NOTE — Progress Notes (Signed)
   Subjective:    HPI  F/u dentures problems - they will be re-fitted in Feb  The patient is here to follow up on chronic depression, anxiety, headaches and chronic moderate to severe LBP symptoms  controlled with medicinesand exercise most of the time. F/u Crohn's.  F/u aches, fatigue - chronic F/u HTN - still not taking Lotensin  BP Readings from Last 3 Encounters:  03/02/13 146/60  12/29/12 150/90  11/03/12 160/100   Wt Readings from Last 3 Encounters:  03/02/13 166 lb (75.297 kg)  12/29/12 169 lb (76.658 kg)  11/03/12 164 lb (74.39 kg)       Review of Systems  Constitutional: Positive for fatigue. Negative for chills, activity change, appetite change and unexpected weight change.  HENT: Negative for congestion, mouth sores and sinus pressure.   Eyes: Negative for visual disturbance.  Respiratory: Negative for cough and chest tightness.   Gastrointestinal: Positive for abdominal pain and blood in stool. Negative for nausea.  Genitourinary: Negative for frequency, difficulty urinating and vaginal pain.  Musculoskeletal: Positive for back pain. Negative for gait problem.  Skin: Negative for pallor and rash.  Neurological: Negative for dizziness, tremors, weakness, numbness and headaches.  Psychiatric/Behavioral: Positive for decreased concentration. Negative for suicidal ideas, confusion, sleep disturbance and dysphoric mood. The patient is nervous/anxious.        Objective:   Physical Exam  Constitutional: She appears well-developed. No distress.  obese  HENT:  Head: Normocephalic.  Right Ear: External ear normal.  Left Ear: External ear normal.  Nose: Nose normal.  Mouth/Throat: Oropharynx is clear and moist.  Eyes: Conjunctivae are normal. Pupils are equal, round, and reactive to light. Right eye exhibits no discharge. Left eye exhibits no discharge.  Neck: Normal range of motion. Neck supple. No JVD present. No tracheal deviation present. No thyromegaly  present.  Cardiovascular: Normal rate, regular rhythm and normal heart sounds.   Pulmonary/Chest: No stridor. No respiratory distress. She has no wheezes.  Abdominal: Soft. Bowel sounds are normal. She exhibits no distension and no mass. There is tenderness. There is no rebound and no guarding.  Musculoskeletal: She exhibits tenderness (LS is tender w/ROM). She exhibits no edema.  LS/thor back is tender to palp  Lymphadenopathy:    She has no cervical adenopathy.  Neurological: She displays normal reflexes. No cranial nerve deficit. She exhibits normal muscle tone. Coordination normal.  Skin: No rash noted. No erythema.  Psychiatric: She has a normal mood and affect. Her behavior is normal. Judgment and thought content normal.  Edentulous   Lab Results  Component Value Date   WBC 8.4 03/03/2012   HGB 12.6 03/03/2012   HCT 38.1 03/03/2012   PLT 180.0 03/03/2012   GLUCOSE 98 03/03/2012   CHOL 233* 05/01/2011   TRIG 217.0* 05/01/2011   HDL 32.50* 05/01/2011   LDLDIRECT 171.9 05/01/2011   LDLCALC 115* 03/17/2007   ALT 10 05/01/2011   AST 10 05/01/2011   NA 138 03/03/2012   K 4.3 03/03/2012   CL 106 03/03/2012   CREATININE 1.0 03/03/2012   BUN 13 03/03/2012   CO2 26 03/03/2012   TSH 2.07 03/03/2012          Assessment & Plan:

## 2013-03-02 NOTE — Assessment & Plan Note (Signed)
Stable

## 2013-03-02 NOTE — Assessment & Plan Note (Signed)
Continue with current prescription therapy as reflected on the Med list.  

## 2013-03-02 NOTE — Progress Notes (Signed)
Pre visit review using our clinic review tool, if applicable. No additional management support is needed unless otherwise documented below in the visit note. 

## 2013-03-03 ENCOUNTER — Other Ambulatory Visit (INDEPENDENT_AMBULATORY_CARE_PROVIDER_SITE_OTHER): Payer: 59

## 2013-03-03 ENCOUNTER — Other Ambulatory Visit: Payer: Self-pay | Admitting: *Deleted

## 2013-03-03 DIAGNOSIS — N3000 Acute cystitis without hematuria: Secondary | ICD-10-CM | POA: Diagnosis not present

## 2013-03-03 DIAGNOSIS — M545 Low back pain, unspecified: Secondary | ICD-10-CM

## 2013-03-03 LAB — URINALYSIS, ROUTINE W REFLEX MICROSCOPIC
Bilirubin Urine: NEGATIVE
Hgb urine dipstick: NEGATIVE
Ketones, ur: NEGATIVE
Nitrite: NEGATIVE
Specific Gravity, Urine: 1.015 (ref 1.000–1.030)
Total Protein, Urine: NEGATIVE
Urine Glucose: NEGATIVE
Urobilinogen, UA: 0.2 (ref 0.0–1.0)
pH: 5.5 (ref 5.0–8.0)

## 2013-03-04 LAB — DRUG SCREEN, URINE
Amphetamines, Urine: NEGATIVE ng/mL
Barbiturates: NEGATIVE ng/mL
Benzodiazepines: POSITIVE ng/mL
Cannabinoid: NEGATIVE ng/mL
Cocaine Metabolite: NEGATIVE ng/mL
Opiates: POSITIVE ng/mL
Phencyclidine: NEGATIVE ng/mL

## 2013-03-07 ENCOUNTER — Encounter: Payer: Self-pay | Admitting: Internal Medicine

## 2013-03-31 ENCOUNTER — Other Ambulatory Visit: Payer: Self-pay | Admitting: Internal Medicine

## 2013-03-31 NOTE — Telephone Encounter (Signed)
Refill request for ketoralac Last filled by MD on - 03/02/13 #20 x1 Last Appt: 03/02/13 Next Appt: 06/01/13 Please advise refill?

## 2013-04-01 DIAGNOSIS — M542 Cervicalgia: Secondary | ICD-10-CM | POA: Diagnosis not present

## 2013-04-01 DIAGNOSIS — M62838 Other muscle spasm: Secondary | ICD-10-CM | POA: Diagnosis not present

## 2013-04-01 DIAGNOSIS — M545 Low back pain, unspecified: Secondary | ICD-10-CM | POA: Diagnosis not present

## 2013-04-15 DIAGNOSIS — M5430 Sciatica, unspecified side: Secondary | ICD-10-CM | POA: Diagnosis not present

## 2013-04-15 DIAGNOSIS — M545 Low back pain, unspecified: Secondary | ICD-10-CM | POA: Diagnosis not present

## 2013-04-15 DIAGNOSIS — M62838 Other muscle spasm: Secondary | ICD-10-CM | POA: Diagnosis not present

## 2013-04-28 ENCOUNTER — Other Ambulatory Visit: Payer: Self-pay | Admitting: Internal Medicine

## 2013-06-01 ENCOUNTER — Encounter: Payer: Self-pay | Admitting: Internal Medicine

## 2013-06-01 ENCOUNTER — Ambulatory Visit (INDEPENDENT_AMBULATORY_CARE_PROVIDER_SITE_OTHER): Payer: 59 | Admitting: Internal Medicine

## 2013-06-01 VITALS — BP 108/60 | HR 72 | Temp 97.0°F | Resp 16 | Wt 161.0 lb

## 2013-06-01 DIAGNOSIS — E538 Deficiency of other specified B group vitamins: Secondary | ICD-10-CM | POA: Diagnosis not present

## 2013-06-01 DIAGNOSIS — M545 Low back pain, unspecified: Secondary | ICD-10-CM | POA: Diagnosis not present

## 2013-06-01 DIAGNOSIS — N824 Other female intestinal-genital tract fistulae: Secondary | ICD-10-CM

## 2013-06-01 DIAGNOSIS — K5090 Crohn's disease, unspecified, without complications: Secondary | ICD-10-CM

## 2013-06-01 DIAGNOSIS — K612 Anorectal abscess: Secondary | ICD-10-CM | POA: Diagnosis not present

## 2013-06-01 DIAGNOSIS — F17200 Nicotine dependence, unspecified, uncomplicated: Secondary | ICD-10-CM

## 2013-06-01 MED ORDER — MEPERIDINE HCL 50 MG PO TABS: 50.0000 mg | ORAL_TABLET | Freq: Every day | ORAL | Status: DC | PRN

## 2013-06-01 MED ORDER — HYDROCODONE-ACETAMINOPHEN 10-325 MG PO TABS: 1.0000 | ORAL_TABLET | Freq: Four times a day (QID) | ORAL | Status: DC | PRN

## 2013-06-01 MED ORDER — CIPROFLOXACIN HCL 500 MG PO TABS: 500.0000 mg | ORAL_TABLET | Freq: Two times a day (BID) | ORAL | Status: AC

## 2013-06-01 MED ORDER — KETOROLAC TROMETHAMINE 10 MG PO TABS: 10.0000 mg | ORAL_TABLET | Freq: Four times a day (QID) | ORAL | Status: DC | PRN

## 2013-06-01 NOTE — Assessment & Plan Note (Signed)
Continue with current prescription therapy as reflected on the Med list.  

## 2013-06-01 NOTE — Progress Notes (Signed)
   Subjective:    HPI  F/u dentures problems - they will be re-fitted in Feb  The patient is here to follow up on chronic depression, anxiety, headaches and chronic moderate to severe LBP symptoms  controlled with medicinesand exercise most of the time. F/u Crohn's.  F/u aches, fatigue - chronic F/u HTN - still not taking Lotensin  BP Readings from Last 3 Encounters:  06/01/13 108/60  03/02/13 146/60  12/29/12 150/90   Wt Readings from Last 3 Encounters:  06/01/13 161 lb (73.029 kg)  03/02/13 166 lb (75.297 kg)  12/29/12 169 lb (76.658 kg)       Review of Systems  Constitutional: Positive for fatigue. Negative for chills, activity change, appetite change and unexpected weight change.  HENT: Negative for congestion, mouth sores and sinus pressure.   Eyes: Negative for visual disturbance.  Respiratory: Negative for cough and chest tightness.   Gastrointestinal: Positive for abdominal pain and blood in stool. Negative for nausea.  Genitourinary: Negative for frequency, difficulty urinating and vaginal pain.  Musculoskeletal: Positive for back pain. Negative for gait problem.  Skin: Negative for pallor and rash.  Neurological: Negative for dizziness, tremors, weakness, numbness and headaches.  Psychiatric/Behavioral: Positive for decreased concentration. Negative for suicidal ideas, confusion, sleep disturbance and dysphoric mood. The patient is nervous/anxious.        Objective:   Physical Exam  Constitutional: She appears well-developed. No distress.  obese  HENT:  Head: Normocephalic.  Right Ear: External ear normal.  Left Ear: External ear normal.  Nose: Nose normal.  Mouth/Throat: Oropharynx is clear and moist.  Eyes: Conjunctivae are normal. Pupils are equal, round, and reactive to light. Right eye exhibits no discharge. Left eye exhibits no discharge.  Neck: Normal range of motion. Neck supple. No JVD present. No tracheal deviation present. No thyromegaly  present.  Cardiovascular: Normal rate, regular rhythm and normal heart sounds.   Pulmonary/Chest: No stridor. No respiratory distress. She has no wheezes.  Abdominal: Soft. Bowel sounds are normal. She exhibits no distension and no mass. There is tenderness. There is no rebound and no guarding.  Musculoskeletal: She exhibits tenderness (LS is tender w/ROM). She exhibits no edema.  LS/thor back is tender to palp  Lymphadenopathy:    She has no cervical adenopathy.  Neurological: She displays normal reflexes. No cranial nerve deficit. She exhibits normal muscle tone. Coordination normal.  Skin: No rash noted. No erythema.  Psychiatric: She has a normal mood and affect. Her behavior is normal. Judgment and thought content normal.  Edentulous   Lab Results  Component Value Date   WBC 8.4 03/03/2012   HGB 12.6 03/03/2012   HCT 38.1 03/03/2012   PLT 180.0 03/03/2012   GLUCOSE 98 03/03/2012   CHOL 233* 05/01/2011   TRIG 217.0* 05/01/2011   HDL 32.50* 05/01/2011   LDLDIRECT 171.9 05/01/2011   LDLCALC 115* 03/17/2007   ALT 10 05/01/2011   AST 10 05/01/2011   NA 138 03/03/2012   K 4.3 03/03/2012   CL 106 03/03/2012   CREATININE 1.0 03/03/2012   BUN 13 03/03/2012   CO2 26 03/03/2012   TSH 2.07 03/03/2012          Assessment & Plan:

## 2013-06-01 NOTE — Progress Notes (Signed)
Pre visit review using our clinic review tool, if applicable. No additional management support is needed unless otherwise documented below in the visit note. 

## 2013-06-01 NOTE — Assessment & Plan Note (Signed)
Cipro prn 

## 2013-06-02 ENCOUNTER — Telehealth: Payer: Self-pay | Admitting: Internal Medicine

## 2013-06-02 NOTE — Telephone Encounter (Signed)
Relevant patient education assigned to patient using Emmi. ° °

## 2013-06-23 ENCOUNTER — Other Ambulatory Visit: Payer: Self-pay | Admitting: Internal Medicine

## 2013-06-26 ENCOUNTER — Encounter: Payer: Self-pay | Admitting: Internal Medicine

## 2013-06-30 ENCOUNTER — Other Ambulatory Visit: Payer: Self-pay | Admitting: *Deleted

## 2013-06-30 MED ORDER — LORCASERIN HCL 10 MG PO TABS: 1.0000 | ORAL_TABLET | Freq: Two times a day (BID) | ORAL | Status: DC

## 2013-07-02 ENCOUNTER — Encounter: Payer: Self-pay | Admitting: Internal Medicine

## 2013-07-19 ENCOUNTER — Other Ambulatory Visit: Payer: Self-pay | Admitting: Internal Medicine

## 2013-07-26 ENCOUNTER — Other Ambulatory Visit: Payer: Self-pay | Admitting: Internal Medicine

## 2013-07-28 ENCOUNTER — Other Ambulatory Visit: Payer: Self-pay | Admitting: Internal Medicine

## 2013-08-18 ENCOUNTER — Other Ambulatory Visit: Payer: Self-pay | Admitting: Internal Medicine

## 2013-08-26 ENCOUNTER — Ambulatory Visit (INDEPENDENT_AMBULATORY_CARE_PROVIDER_SITE_OTHER): Payer: 59 | Admitting: Internal Medicine

## 2013-08-26 ENCOUNTER — Other Ambulatory Visit (INDEPENDENT_AMBULATORY_CARE_PROVIDER_SITE_OTHER): Payer: 59

## 2013-08-26 ENCOUNTER — Encounter: Payer: Self-pay | Admitting: Internal Medicine

## 2013-08-26 VITALS — BP 116/62 | HR 64 | Temp 98.3°F | Resp 16 | Wt 159.0 lb

## 2013-08-26 DIAGNOSIS — F3289 Other specified depressive episodes: Secondary | ICD-10-CM

## 2013-08-26 DIAGNOSIS — M5440 Lumbago with sciatica, unspecified side: Secondary | ICD-10-CM

## 2013-08-26 DIAGNOSIS — F17200 Nicotine dependence, unspecified, uncomplicated: Secondary | ICD-10-CM

## 2013-08-26 DIAGNOSIS — M5430 Sciatica, unspecified side: Secondary | ICD-10-CM

## 2013-08-26 DIAGNOSIS — F411 Generalized anxiety disorder: Secondary | ICD-10-CM

## 2013-08-26 DIAGNOSIS — E538 Deficiency of other specified B group vitamins: Secondary | ICD-10-CM

## 2013-08-26 DIAGNOSIS — K612 Anorectal abscess: Secondary | ICD-10-CM

## 2013-08-26 DIAGNOSIS — K5090 Crohn's disease, unspecified, without complications: Secondary | ICD-10-CM

## 2013-08-26 DIAGNOSIS — N824 Other female intestinal-genital tract fistulae: Secondary | ICD-10-CM

## 2013-08-26 DIAGNOSIS — R635 Abnormal weight gain: Secondary | ICD-10-CM | POA: Diagnosis not present

## 2013-08-26 DIAGNOSIS — F329 Major depressive disorder, single episode, unspecified: Secondary | ICD-10-CM

## 2013-08-26 DIAGNOSIS — G43909 Migraine, unspecified, not intractable, without status migrainosus: Secondary | ICD-10-CM

## 2013-08-26 LAB — HEPATIC FUNCTION PANEL
ALT: 10 U/L (ref 0–35)
AST: 15 U/L (ref 0–37)
Albumin: 3.9 g/dL (ref 3.5–5.2)
Alkaline Phosphatase: 73 U/L (ref 39–117)
Bilirubin, Direct: 0.1 mg/dL (ref 0.0–0.3)
Total Bilirubin: 0.4 mg/dL (ref 0.2–1.2)
Total Protein: 6.8 g/dL (ref 6.0–8.3)

## 2013-08-26 LAB — CBC WITH DIFFERENTIAL/PLATELET
Basophils Absolute: 0 10*3/uL (ref 0.0–0.1)
Basophils Relative: 0.3 % (ref 0.0–3.0)
Eosinophils Absolute: 0.1 10*3/uL (ref 0.0–0.7)
Eosinophils Relative: 0.6 % (ref 0.0–5.0)
HCT: 36.5 % (ref 36.0–46.0)
Hemoglobin: 12.5 g/dL (ref 12.0–15.0)
Lymphocytes Relative: 18.7 % (ref 12.0–46.0)
Lymphs Abs: 1.5 10*3/uL (ref 0.7–4.0)
MCHC: 34.3 g/dL (ref 30.0–36.0)
MCV: 86.3 fl (ref 78.0–100.0)
Monocytes Absolute: 0.4 10*3/uL (ref 0.1–1.0)
Monocytes Relative: 4.8 % (ref 3.0–12.0)
Neutro Abs: 6.1 10*3/uL (ref 1.4–7.7)
Neutrophils Relative %: 75.6 % (ref 43.0–77.0)
Platelets: 168 10*3/uL (ref 150.0–400.0)
RBC: 4.23 Mil/uL (ref 3.87–5.11)
RDW: 13.6 % (ref 11.5–15.5)
WBC: 8.1 10*3/uL (ref 4.0–10.5)

## 2013-08-26 LAB — BASIC METABOLIC PANEL
BUN: 17 mg/dL (ref 6–23)
CO2: 27 mEq/L (ref 19–32)
Calcium: 9.2 mg/dL (ref 8.4–10.5)
Chloride: 104 mEq/L (ref 96–112)
Creatinine, Ser: 1 mg/dL (ref 0.4–1.2)
GFR: 62.65 mL/min (ref >60.00)
Glucose, Bld: 79 mg/dL (ref 70–99)
Potassium: 3.3 mEq/L — ABNORMAL LOW (ref 3.5–5.1)
Sodium: 138 mEq/L (ref 135–145)

## 2013-08-26 LAB — TSH: TSH: 1.52 u[IU]/mL (ref 0.35–4.50)

## 2013-08-26 LAB — VITAMIN B12: Vitamin B-12: 436 pg/mL (ref 211–911)

## 2013-08-26 MED ORDER — MEPERIDINE HCL 50 MG PO TABS: 50.0000 mg | ORAL_TABLET | Freq: Every day | ORAL | Status: DC | PRN

## 2013-08-26 MED ORDER — CYCLOBENZAPRINE HCL 10 MG PO TABS: ORAL_TABLET | ORAL | Status: DC

## 2013-08-26 MED ORDER — KETOROLAC TROMETHAMINE 10 MG PO TABS: ORAL_TABLET | ORAL | Status: DC

## 2013-08-26 MED ORDER — HYDROCODONE-ACETAMINOPHEN 10-325 MG PO TABS: 1.0000 | ORAL_TABLET | Freq: Four times a day (QID) | ORAL | Status: DC | PRN

## 2013-08-26 NOTE — Progress Notes (Signed)
Pre visit review using our clinic review tool, if applicable. No additional management support is needed unless otherwise documented below in the visit note. 

## 2013-08-26 NOTE — Assessment & Plan Note (Signed)
Continue with current prescription therapy as reflected on the Med list.  

## 2013-08-26 NOTE — Assessment & Plan Note (Signed)
Discussed.

## 2013-08-26 NOTE — Assessment & Plan Note (Signed)
Better with lower BP

## 2013-08-26 NOTE — Assessment & Plan Note (Signed)
stable °

## 2013-08-26 NOTE — Assessment & Plan Note (Signed)
Better Wt Readings from Last 3 Encounters:  08/26/13 159 lb (72.122 kg)  06/01/13 161 lb (73.029 kg)  03/02/13 166 lb (75.297 kg)

## 2013-08-26 NOTE — Assessment & Plan Note (Signed)
Demerol po prn for severe pain infrequently

## 2013-08-26 NOTE — Progress Notes (Signed)
   Subjective:    HPI  F/u dentures problems - they will be re-fitted in Feb  The patient is here to follow up on chronic depression, anxiety, headaches and chronic moderate to severe LBP symptoms  controlled with medicinesand exercise most of the time. F/u Crohn's.  F/u aches, fatigue - chronic F/u HTN - still not taking Lotensin  BP Readings from Last 3 Encounters:  08/26/13 116/62  06/01/13 108/60  03/02/13 146/60   Wt Readings from Last 3 Encounters:  08/26/13 159 lb (72.122 kg)  06/01/13 161 lb (73.029 kg)  03/02/13 166 lb (75.297 kg)       Review of Systems  Constitutional: Positive for fatigue. Negative for chills, activity change, appetite change and unexpected weight change.  HENT: Negative for congestion, mouth sores and sinus pressure.   Eyes: Negative for visual disturbance.  Respiratory: Negative for cough and chest tightness.   Gastrointestinal: Positive for abdominal pain and blood in stool. Negative for nausea.  Genitourinary: Negative for frequency, difficulty urinating and vaginal pain.  Musculoskeletal: Positive for back pain. Negative for gait problem.  Skin: Negative for pallor and rash.  Neurological: Negative for dizziness, tremors, weakness, numbness and headaches.  Psychiatric/Behavioral: Positive for decreased concentration. Negative for suicidal ideas, confusion, sleep disturbance and dysphoric mood. The patient is nervous/anxious.        Objective:   Physical Exam  Constitutional: She appears well-developed. No distress.  obese  HENT:  Head: Normocephalic.  Right Ear: External ear normal.  Left Ear: External ear normal.  Nose: Nose normal.  Mouth/Throat: Oropharynx is clear and moist.  Eyes: Conjunctivae are normal. Pupils are equal, round, and reactive to light. Right eye exhibits no discharge. Left eye exhibits no discharge.  Neck: Normal range of motion. Neck supple. No JVD present. No tracheal deviation present. No thyromegaly  present.  Cardiovascular: Normal rate, regular rhythm and normal heart sounds.   Pulmonary/Chest: No stridor. No respiratory distress. She has no wheezes.  Abdominal: Soft. Bowel sounds are normal. She exhibits no distension and no mass. There is tenderness. There is no rebound and no guarding.  Musculoskeletal: She exhibits tenderness (LS is tender w/ROM). She exhibits no edema.  LS/thor back is tender to palp  Lymphadenopathy:    She has no cervical adenopathy.  Neurological: She displays normal reflexes. No cranial nerve deficit. She exhibits normal muscle tone. Coordination normal.  Skin: No rash noted. No erythema.  Psychiatric: She has a normal mood and affect. Her behavior is normal. Judgment and thought content normal.  Edentulous   Lab Results  Component Value Date   WBC 8.4 03/03/2012   HGB 12.6 03/03/2012   HCT 38.1 03/03/2012   PLT 180.0 03/03/2012   GLUCOSE 98 03/03/2012   CHOL 233* 05/01/2011   TRIG 217.0* 05/01/2011   HDL 32.50* 05/01/2011   LDLDIRECT 171.9 05/01/2011   LDLCALC 115* 03/17/2007   ALT 10 05/01/2011   AST 10 05/01/2011   NA 138 03/03/2012   K 4.3 03/03/2012   CL 106 03/03/2012   CREATININE 1.0 03/03/2012   BUN 13 03/03/2012   CO2 26 03/03/2012   TSH 2.07 03/03/2012          Assessment & Plan:

## 2013-08-27 ENCOUNTER — Ambulatory Visit: Payer: 59 | Admitting: Internal Medicine

## 2013-08-28 ENCOUNTER — Ambulatory Visit (INDEPENDENT_AMBULATORY_CARE_PROVIDER_SITE_OTHER): Payer: 59 | Admitting: Obstetrics and Gynecology

## 2013-08-28 ENCOUNTER — Encounter: Payer: Self-pay | Admitting: Obstetrics and Gynecology

## 2013-08-28 ENCOUNTER — Other Ambulatory Visit (HOSPITAL_COMMUNITY)
Admission: RE | Admit: 2013-08-28 | Discharge: 2013-08-28 | Disposition: A | Discharge status: Home / Self Care | Payer: 59 | Source: Ambulatory Visit | Attending: Obstetrics and Gynecology | Admitting: Obstetrics and Gynecology

## 2013-08-28 VITALS — BP 90/50 | Ht 62.0 in | Wt 159.0 lb

## 2013-08-28 DIAGNOSIS — Z01419 Encounter for gynecological examination (general) (routine) without abnormal findings: Secondary | ICD-10-CM | POA: Insufficient documentation

## 2013-08-28 DIAGNOSIS — Z1212 Encounter for screening for malignant neoplasm of rectum: Secondary | ICD-10-CM | POA: Diagnosis not present

## 2013-08-28 DIAGNOSIS — N824 Other female intestinal-genital tract fistulae: Secondary | ICD-10-CM

## 2013-08-28 DIAGNOSIS — Z1151 Encounter for screening for human papillomavirus (HPV): Secondary | ICD-10-CM | POA: Insufficient documentation

## 2013-08-28 LAB — HEMOCCULT GUIAC POC 1CARD (OFFICE): Fecal Occult Blood, POC: NEGATIVE

## 2013-08-28 NOTE — Patient Instructions (Addendum)
We will check in to foley catheter bag setup for you to purchase, call Caryl Pina in 10 days if no followup from [place].  Call Dorina Hoyer for cancer genetic consultation next week at [phone].

## 2013-08-28 NOTE — Progress Notes (Signed)
Patient ID: [name] [name], female   DOB: 1970-03-31, 43 y.o.   MRN: [id number]  Assessment:  Annual Gyn Exam Hx: Crohns disease with RVfistula. Family History of early onset Breast cancer in  Second degree relative.   Plan:  1. pap smear done, next pap due 3 yr 2. return annually or prn 3    Annual mammogram advised 4. Refer to [facility] Canner for Genetic counsel re : cancer risk. Subjective:  [name] [name] is a 43 y.o. female No obstetric history on file. who presents for annual exam. No LMP recorded. Patient is not currently having periods (Reason: Oral contraceptives). Pt here today for annual exam, pt wants to have cancer screening done due to her family history. Pt denies any problems or concerns at this time.  The patient has complaints today of wants referral for cancer genetics consult.  The following portions of the patient's history were reviewed and updated as appropriate: allergies, current medications, past family history, past medical history, past social history, past surgical history and problem list.  Review of Systems Constitutional: negative, Is due screening colonoscopy Gastrointestinal: continues to have vaginal d/c with defecation , particularly if loose stool. PT uses whirlpool to cleanse self when needed due to irritation Genitourinary: Desires a foley catheter set to use for self catheterization when vulva is irritated by the fecal drainage.    Objective:  BP 90/50  Ht 5' 2"  (1.575 m)  Wt 159 lb (72.122 kg)  BMI 29.07 kg/m2   BMI: Body mass index is 29.07 kg/(m^2).  General Appearance: Alert, appropriate appearance for age. No acute distress HEENT: Grossly normal Neck / Thyroid:  Cardiovascular: RRR; normal S1, S2, no murmur Lungs: CTA bilaterally Back: No CVAT Breast Exam: No dimpling, nipple retraction or discharge. No masses or nodes. and No masses or nodes.No dimpling, nipple retraction or discharge. Gastrointestinal: Soft, non-tender, no masses or  organomegaly Pelvic Exam: External genitalia: normal general appearance Vaginal: thickened RVseptum, likely from Crohns Cervix: normal appearance Adnexa: normal bimanual exam and rv septum slight thickened. Uterus: normal single, nontender, anteverted and irregular enlargement Rectal: good sphincter tone, guaiac negative and confirms rv septum slight thickness. Rectovaginal:  Lymphatic Exam: Non-palpable nodes in inguinal regions  Skin: no rash or abnormalities Neurologic: Normal gait and speech, no tremor  Psychiatric: Alert and oriented, appropriate affect.  Urinalysis:Not done  Mallory Shirk. MD Pgr [phone] 8:58 AM

## 2013-08-31 DIAGNOSIS — F319 Bipolar disorder, unspecified: Secondary | ICD-10-CM | POA: Diagnosis not present

## 2013-08-31 LAB — CYTOLOGY - PAP

## 2013-09-03 ENCOUNTER — Telehealth: Payer: Self-pay | Admitting: Obstetrics and Gynecology

## 2013-09-04 ENCOUNTER — Telehealth: Payer: Self-pay | Admitting: Obstetrics and Gynecology

## 2013-09-04 NOTE — Telephone Encounter (Signed)
Pt aware that it will be Monday before we can have the papers ready for her. Pt aware they will be at front office and she can pick them up at her convenience.

## 2013-09-04 NOTE — Telephone Encounter (Signed)
Pt states that she saw JVF last week and was told by JVF that he would get with [facility] and get the pt some information and papers to see Jinny Blossom about genetics. Pt was told by JVF that the papers would be ready by today, I advised the pt that I would speak with JVF about above and call her back and let know either way.  The pt verbalized understanding.

## 2013-09-07 ENCOUNTER — Telehealth: Payer: Self-pay | Admitting: Obstetrics and Gynecology

## 2013-09-07 NOTE — Telephone Encounter (Signed)
Pt aware to pick up papers tomorrow.

## 2013-10-27 ENCOUNTER — Other Ambulatory Visit: Payer: Self-pay | Admitting: *Deleted

## 2013-10-28 MED ORDER — NORETHINDRONE ACETATE 5 MG PO TABS: ORAL_TABLET | ORAL | Status: DC

## 2013-11-19 ENCOUNTER — Other Ambulatory Visit: Payer: Self-pay | Admitting: Internal Medicine

## 2013-11-19 NOTE — Telephone Encounter (Signed)
Ok to Rf in PCP's absence?  

## 2013-11-30 ENCOUNTER — Encounter: Payer: Self-pay | Admitting: Internal Medicine

## 2013-11-30 ENCOUNTER — Ambulatory Visit (INDEPENDENT_AMBULATORY_CARE_PROVIDER_SITE_OTHER): Payer: 59 | Admitting: Internal Medicine

## 2013-11-30 ENCOUNTER — Other Ambulatory Visit (INDEPENDENT_AMBULATORY_CARE_PROVIDER_SITE_OTHER): Payer: 59

## 2013-11-30 VITALS — BP 138/90 | HR 92 | Temp 98.3°F | Wt 159.0 lb

## 2013-11-30 DIAGNOSIS — R635 Abnormal weight gain: Secondary | ICD-10-CM

## 2013-11-30 DIAGNOSIS — K50913 Crohn's disease, unspecified, with fistula: Secondary | ICD-10-CM

## 2013-11-30 DIAGNOSIS — F411 Generalized anxiety disorder: Secondary | ICD-10-CM

## 2013-11-30 DIAGNOSIS — K612 Anorectal abscess: Secondary | ICD-10-CM

## 2013-11-30 DIAGNOSIS — F17200 Nicotine dependence, unspecified, uncomplicated: Secondary | ICD-10-CM

## 2013-11-30 DIAGNOSIS — Z720 Tobacco use: Secondary | ICD-10-CM

## 2013-11-30 DIAGNOSIS — M5440 Lumbago with sciatica, unspecified side: Secondary | ICD-10-CM

## 2013-11-30 DIAGNOSIS — E669 Obesity, unspecified: Secondary | ICD-10-CM

## 2013-11-30 DIAGNOSIS — E538 Deficiency of other specified B group vitamins: Secondary | ICD-10-CM | POA: Diagnosis not present

## 2013-11-30 DIAGNOSIS — Z23 Encounter for immunization: Secondary | ICD-10-CM

## 2013-11-30 DIAGNOSIS — I10 Essential (primary) hypertension: Secondary | ICD-10-CM

## 2013-11-30 LAB — HEPATIC FUNCTION PANEL
ALT: 13 U/L (ref 0–35)
AST: 13 U/L (ref 0–37)
Albumin: 3.5 g/dL (ref 3.5–5.2)
Alkaline Phosphatase: 58 U/L (ref 39–117)
Bilirubin, Direct: 0.1 mg/dL (ref 0.0–0.3)
Total Bilirubin: 0.5 mg/dL (ref 0.2–1.2)
Total Protein: 7.1 g/dL (ref 6.0–8.3)

## 2013-11-30 LAB — BASIC METABOLIC PANEL
BUN: 17 mg/dL (ref 6–23)
CO2: 27 mEq/L (ref 19–32)
Calcium: 8.7 mg/dL (ref 8.4–10.5)
Chloride: 105 mEq/L (ref 96–112)
Creatinine, Ser: 0.9 mg/dL (ref 0.4–1.2)
GFR: 72.3 mL/min (ref >60.00)
Glucose, Bld: 97 mg/dL (ref 70–99)
Potassium: 4.4 mEq/L (ref 3.5–5.1)
Sodium: 138 mEq/L (ref 135–145)

## 2013-11-30 LAB — CBC WITH DIFFERENTIAL/PLATELET
Basophils Absolute: 0 10*3/uL (ref 0.0–0.1)
Basophils Relative: 0.2 % (ref 0.0–3.0)
Eosinophils Absolute: 0 10*3/uL (ref 0.0–0.7)
Eosinophils Relative: 0.1 % (ref 0.0–5.0)
HCT: 39.8 % (ref 36.0–46.0)
Hemoglobin: 13.3 g/dL (ref 12.0–15.0)
Lymphocytes Relative: 9.8 % — ABNORMAL LOW (ref 12.0–46.0)
Lymphs Abs: 0.8 10*3/uL (ref 0.7–4.0)
MCHC: 33.5 g/dL (ref 30.0–36.0)
MCV: 86.7 fl (ref 78.0–100.0)
Monocytes Absolute: 0.2 10*3/uL (ref 0.1–1.0)
Monocytes Relative: 2.7 % — ABNORMAL LOW (ref 3.0–12.0)
Neutro Abs: 6.7 10*3/uL (ref 1.4–7.7)
Neutrophils Relative %: 87.2 % — ABNORMAL HIGH (ref 43.0–77.0)
Platelets: 158 10*3/uL (ref 150.0–400.0)
RBC: 4.59 Mil/uL (ref 3.87–5.11)
RDW: 14.1 % (ref 11.5–15.5)
WBC: 7.7 10*3/uL (ref 4.0–10.5)

## 2013-11-30 LAB — SEDIMENTATION RATE: Sed Rate: 24 mm/hr — ABNORMAL HIGH (ref 0–22)

## 2013-11-30 LAB — TSH: TSH: 1.58 u[IU]/mL (ref 0.35–4.50)

## 2013-11-30 MED ORDER — KETOROLAC TROMETHAMINE 10 MG PO TABS: ORAL_TABLET | ORAL | Status: DC

## 2013-11-30 MED ORDER — HYDROCODONE-ACETAMINOPHEN 10-325 MG PO TABS: 1.0000 | ORAL_TABLET | Freq: Four times a day (QID) | ORAL | Status: DC | PRN

## 2013-11-30 MED ORDER — CIPROFLOXACIN HCL 500 MG PO TABS: 500.0000 mg | ORAL_TABLET | Freq: Two times a day (BID) | ORAL | Status: DC

## 2013-11-30 MED ORDER — MEPERIDINE HCL 50 MG PO TABS: 50.0000 mg | ORAL_TABLET | Freq: Every day | ORAL | Status: DC | PRN

## 2013-11-30 MED ORDER — PANTOPRAZOLE SODIUM 40 MG PO TBEC: 40.0000 mg | DELAYED_RELEASE_TABLET | Freq: Every day | ORAL | Status: DC

## 2013-11-30 NOTE — Assessment & Plan Note (Signed)
Potential benefits of a long term benzodiazepines  use as well as potential risks  and complications were explained to the patient and were aknowledged.

## 2013-11-30 NOTE — Progress Notes (Signed)
Pre visit review using our clinic review tool, if applicable. No additional management support is needed unless otherwise documented below in the visit note. 

## 2013-11-30 NOTE — Assessment & Plan Note (Signed)
Continue with current prescription therapy as reflected on the Med list.  

## 2013-11-30 NOTE — Assessment & Plan Note (Addendum)
Continue with current prescription therapy as reflected on the Med list.  Potential benefits of a long term opioids use as well as potential risks (i.e. addiction risk, apnea etc) and complications (i.e. Somnolence, constipation and others) were explained to the patient and were aknowledged. Chronic abd pain H/o recurrent fistula Demerol po rare for severe pain

## 2013-11-30 NOTE — Assessment & Plan Note (Signed)
Wt Readings from Last 3 Encounters:  11/30/13 159 lb (72.122 kg)  08/28/13 159 lb (72.122 kg)  08/26/13 159 lb (72.122 kg)

## 2013-11-30 NOTE — Assessment & Plan Note (Signed)
Discussed.

## 2013-11-30 NOTE — Assessment & Plan Note (Signed)
On Rx Chronic and severe On disabiity  Potential benefits of a long term opioids use as well as potential risks (i.e. addiction risk, apnea etc) and complications (i.e. Somnolence, constipation and others) were explained to the patient and were aknowledged.

## 2013-11-30 NOTE — Progress Notes (Signed)
   Subjective:    HPI  F/u dentures problems - they are still a problem  The patient is here to follow up on chronic depression, anxiety, headaches and chronic moderate to severe LBP symptoms  controlled with medicinesand exercise most of the time. F/u Crohn's. [name] [name] has started to take a psychology class at college - 2015  F/u aches, fatigue - chronic F/u HTN - still not taking Lotensin  BP Readings from Last 3 Encounters:  11/30/13 138/90  08/28/13 90/50  08/26/13 116/62   Wt Readings from Last 3 Encounters:  11/30/13 159 lb (72.122 kg)  08/28/13 159 lb (72.122 kg)  08/26/13 159 lb (72.122 kg)       Review of Systems  Constitutional: Positive for fatigue. Negative for chills, activity change, appetite change and unexpected weight change.  HENT: Negative for congestion, mouth sores and sinus pressure.   Eyes: Negative for visual disturbance.  Respiratory: Negative for cough and chest tightness.   Gastrointestinal: Positive for abdominal pain and blood in stool. Negative for nausea.  Genitourinary: Negative for frequency, difficulty urinating and vaginal pain.  Musculoskeletal: Positive for back pain. Negative for gait problem.  Skin: Negative for pallor and rash.  Neurological: Negative for dizziness, tremors, weakness, numbness and headaches.  Psychiatric/Behavioral: Positive for decreased concentration. Negative for suicidal ideas, confusion, sleep disturbance and dysphoric mood. The patient is nervous/anxious.        Objective:   Physical Exam  Constitutional: She appears well-developed. No distress.  obese  HENT:  Head: Normocephalic.  Right Ear: External ear normal.  Left Ear: External ear normal.  Nose: Nose normal.  Mouth/Throat: Oropharynx is clear and moist.  Eyes: Conjunctivae are normal. Pupils are equal, round, and reactive to light. Right eye exhibits no discharge. Left eye exhibits no discharge.  Neck: Normal range of motion. Neck supple. No JVD  present. No tracheal deviation present. No thyromegaly present.  Cardiovascular: Normal rate, regular rhythm and normal heart sounds.   Pulmonary/Chest: No stridor. No respiratory distress. She has no wheezes.  Abdominal: Soft. Bowel sounds are normal. She exhibits no distension and no mass. There is tenderness. There is no rebound and no guarding.  Musculoskeletal: She exhibits tenderness (LS is tender w/ROM). She exhibits no edema.  LS/thor back is tender to palp  Lymphadenopathy:    She has no cervical adenopathy.  Neurological: She displays normal reflexes. No cranial nerve deficit. She exhibits normal muscle tone. Coordination normal.  Skin: No rash noted. No erythema.  Psychiatric: She has a normal mood and affect. Her behavior is normal. Judgment and thought content normal.  Edentulous   Lab Results  Component Value Date   WBC 8.4 03/03/2012   HGB 12.6 03/03/2012   HCT 38.1 03/03/2012   PLT 180.0 03/03/2012   GLUCOSE 98 03/03/2012   CHOL 233* 05/01/2011   TRIG 217.0* 05/01/2011   HDL 32.50* 05/01/2011   LDLDIRECT 171.9 05/01/2011   LDLCALC 115* 03/17/2007   ALT 10 05/01/2011   AST 10 05/01/2011   NA 138 03/03/2012   K 4.3 03/03/2012   CL 106 03/03/2012   CREATININE 1.0 03/03/2012   BUN 13 03/03/2012   CO2 26 03/03/2012   TSH 2.07 03/03/2012    A complex case      Assessment & Plan:

## 2013-12-02 ENCOUNTER — Encounter: Payer: Self-pay | Admitting: Internal Medicine

## 2013-12-02 ENCOUNTER — Telehealth: Payer: Self-pay | Admitting: Internal Medicine

## 2013-12-02 NOTE — Telephone Encounter (Signed)
Patient called and said someone had just given her a call and she missed it.  She is assuming it was Dr. Alain Marion medical assistant.  She wants [name] [name] to give her a call.

## 2013-12-03 NOTE — Telephone Encounter (Signed)
Left detailed mess informing pt- I am not sure who called her/ Her labs were all good per MyChart message from PCP.

## 2013-12-04 NOTE — Telephone Encounter (Signed)
[name] [name], this pt is called back in and is request a call back as soon as you can.  She said she has called several times.  She said she sent Dr Alain Marion a my chart message as well.    Best number to call you [phone]

## 2013-12-21 ENCOUNTER — Other Ambulatory Visit: Payer: Self-pay | Admitting: Internal Medicine

## 2013-12-24 ENCOUNTER — Encounter: Payer: Self-pay | Admitting: Internal Medicine

## 2014-01-02 ENCOUNTER — Other Ambulatory Visit: Payer: Self-pay | Admitting: Internal Medicine

## 2014-01-04 ENCOUNTER — Other Ambulatory Visit: Payer: Self-pay | Admitting: Geriatric Medicine

## 2014-01-04 MED ORDER — CYCLOBENZAPRINE HCL 10 MG PO TABS: ORAL_TABLET | ORAL | Status: DC

## 2014-01-18 ENCOUNTER — Other Ambulatory Visit: Payer: Self-pay | Admitting: Geriatric Medicine

## 2014-01-27 ENCOUNTER — Other Ambulatory Visit: Payer: Self-pay | Admitting: Internal Medicine

## 2014-01-28 ENCOUNTER — Encounter: Payer: Self-pay | Admitting: Internal Medicine

## 2014-01-29 ENCOUNTER — Other Ambulatory Visit: Payer: Self-pay | Admitting: Internal Medicine

## 2014-01-29 NOTE — Telephone Encounter (Signed)
Called pharmacy spoke with Myrene Buddy gave md approval.../lmb

## 2014-02-03 ENCOUNTER — Other Ambulatory Visit: Payer: Self-pay | Admitting: Internal Medicine

## 2014-02-05 ENCOUNTER — Other Ambulatory Visit: Payer: Self-pay | Admitting: *Deleted

## 2014-02-05 MED ORDER — LORCASERIN HCL 10 MG PO TABS: 1.0000 | ORAL_TABLET | Freq: Two times a day (BID) | ORAL | Status: DC

## 2014-02-05 NOTE — Telephone Encounter (Signed)
Pt sent email requesting refill on her Belviq. MD has approved 1 with 2 refills. Sending to pharmacy...[name]

## 2014-02-05 NOTE — Telephone Encounter (Deleted)
A user error has taken place open by mistake

## 2014-02-06 ENCOUNTER — Other Ambulatory Visit: Payer: Self-pay | Admitting: Internal Medicine

## 2014-02-08 ENCOUNTER — Other Ambulatory Visit: Payer: Self-pay | Admitting: Geriatric Medicine

## 2014-02-08 MED ORDER — CYCLOBENZAPRINE HCL 10 MG PO TABS: ORAL_TABLET | ORAL | Status: DC

## 2014-02-21 DIAGNOSIS — H66009 Acute suppurative otitis media without spontaneous rupture of ear drum, unspecified ear: Secondary | ICD-10-CM | POA: Diagnosis not present

## 2014-03-01 ENCOUNTER — Encounter: Payer: Self-pay | Admitting: Internal Medicine

## 2014-03-01 ENCOUNTER — Ambulatory Visit (INDEPENDENT_AMBULATORY_CARE_PROVIDER_SITE_OTHER): Payer: 59 | Admitting: Internal Medicine

## 2014-03-01 VITALS — BP 120/90 | HR 81 | Temp 97.7°F | Wt 144.0 lb

## 2014-03-01 DIAGNOSIS — I10 Essential (primary) hypertension: Secondary | ICD-10-CM

## 2014-03-01 DIAGNOSIS — Z720 Tobacco use: Secondary | ICD-10-CM

## 2014-03-01 DIAGNOSIS — K612 Anorectal abscess: Secondary | ICD-10-CM

## 2014-03-01 DIAGNOSIS — K50913 Crohn's disease, unspecified, with fistula: Secondary | ICD-10-CM

## 2014-03-01 DIAGNOSIS — F3181 Bipolar II disorder: Secondary | ICD-10-CM | POA: Diagnosis not present

## 2014-03-01 DIAGNOSIS — E669 Obesity, unspecified: Secondary | ICD-10-CM

## 2014-03-01 DIAGNOSIS — F17200 Nicotine dependence, unspecified, uncomplicated: Secondary | ICD-10-CM

## 2014-03-01 DIAGNOSIS — M5440 Lumbago with sciatica, unspecified side: Secondary | ICD-10-CM | POA: Diagnosis not present

## 2014-03-01 MED ORDER — CYANOCOBALAMIN 1000 MCG/ML IJ SOLN: 1000.0000 ug | INTRAMUSCULAR | Status: DC

## 2014-03-01 MED ORDER — HYDROCODONE-ACETAMINOPHEN 10-325 MG PO TABS: 1.0000 | ORAL_TABLET | Freq: Four times a day (QID) | ORAL | Status: DC | PRN

## 2014-03-01 MED ORDER — KETOROLAC TROMETHAMINE 10 MG PO TABS: ORAL_TABLET | ORAL | Status: DC

## 2014-03-01 MED ORDER — MEPERIDINE HCL 50 MG PO TABS: 50.0000 mg | ORAL_TABLET | Freq: Every day | ORAL | Status: DC | PRN

## 2014-03-01 MED ORDER — CYCLOBENZAPRINE HCL 10 MG PO TABS: ORAL_TABLET | ORAL | Status: DC

## 2014-03-01 MED ORDER — PANTOPRAZOLE SODIUM 40 MG PO TBEC: 40.0000 mg | DELAYED_RELEASE_TABLET | Freq: Every day | ORAL | Status: DC

## 2014-03-01 NOTE — Assessment & Plan Note (Signed)
Continue with current prescription therapy as reflected on the Med list.  

## 2014-03-01 NOTE — Assessment & Plan Note (Signed)
Continue with current prn prescription therapy as reflected on the Med list.  Potential benefits of a long term opioids use as well as potential risks (i.e. addiction risk, apnea etc) and complications (i.e. Somnolence, constipation and others) were explained to the patient and were aknowledged.

## 2014-03-01 NOTE — Assessment & Plan Note (Signed)
1/2 ppd Discussed  

## 2014-03-01 NOTE — Progress Notes (Signed)
Pre visit review using our clinic review tool, if applicable. No additional management support is needed unless otherwise documented below in the visit note. 

## 2014-03-01 NOTE — Assessment & Plan Note (Signed)
Doing ok now 

## 2014-03-01 NOTE — Progress Notes (Signed)
   Subjective:    HPI  F/u dentures problems - they are still a problem  The patient is here to follow up on chronic depression, anxiety, headaches and chronic moderate to severe LBP symptoms  controlled with medicinesand exercise most of the time. F/u Crohn's. [name] has started to take a psychology class at college - 2015  F/u aches, fatigue - chronic F/u HTN - still not taking Lotensin  BP Readings from Last 3 Encounters:  03/01/14 120/90  11/30/13 138/90  08/28/13 90/50   Wt Readings from Last 3 Encounters:  03/01/14 144 lb (65.318 kg)  11/30/13 159 lb (72.122 kg)  08/28/13 159 lb (72.122 kg)       Review of Systems  Constitutional: Positive for fatigue. Negative for chills, activity change, appetite change and unexpected weight change.  HENT: Negative for congestion, mouth sores and sinus pressure.   Eyes: Negative for visual disturbance.  Respiratory: Negative for cough and chest tightness.   Gastrointestinal: Positive for abdominal pain and blood in stool. Negative for nausea.  Genitourinary: Negative for frequency, difficulty urinating and vaginal pain.  Musculoskeletal: Positive for back pain. Negative for gait problem.  Skin: Negative for pallor and rash.  Neurological: Negative for dizziness, tremors, weakness, numbness and headaches.  Psychiatric/Behavioral: Positive for decreased concentration. Negative for suicidal ideas, confusion, sleep disturbance and dysphoric mood. The patient is nervous/anxious.        Objective:   Physical Exam  Constitutional: She appears well-developed. No distress.  obese  HENT:  Head: Normocephalic.  Right Ear: External ear normal.  Left Ear: External ear normal.  Nose: Nose normal.  Mouth/Throat: Oropharynx is clear and moist.  Eyes: Conjunctivae are normal. Pupils are equal, round, and reactive to light. Right eye exhibits no discharge. Left eye exhibits no discharge.  Neck: Normal range of motion. Neck supple. No JVD  present. No tracheal deviation present. No thyromegaly present.  Cardiovascular: Normal rate, regular rhythm and normal heart sounds.   Pulmonary/Chest: No stridor. No respiratory distress. She has no wheezes.  Abdominal: Soft. Bowel sounds are normal. She exhibits no distension and no mass. There is tenderness. There is no rebound and no guarding.  Musculoskeletal: She exhibits tenderness (LS is tender w/ROM). She exhibits no edema.  LS/thor back is tender to palp  Lymphadenopathy:    She has no cervical adenopathy.  Neurological: She displays normal reflexes. No cranial nerve deficit. She exhibits normal muscle tone. Coordination normal.  Skin: No rash noted. No erythema.  Psychiatric: She has a normal mood and affect. Her behavior is normal. Judgment and thought content normal.  Edentulous   Lab Results  Component Value Date   WBC 8.4 03/03/2012   HGB 12.6 03/03/2012   HCT 38.1 03/03/2012   PLT 180.0 03/03/2012   GLUCOSE 98 03/03/2012   CHOL 233* 05/01/2011   TRIG 217.0* 05/01/2011   HDL 32.50* 05/01/2011   LDLDIRECT 171.9 05/01/2011   LDLCALC 115* 03/17/2007   ALT 10 05/01/2011   AST 10 05/01/2011   NA 138 03/03/2012   K 4.3 03/03/2012   CL 106 03/03/2012   CREATININE 1.0 03/03/2012   BUN 13 03/03/2012   CO2 26 03/03/2012   TSH 2.07 03/03/2012          Assessment & Plan:  Patient ID: [name] [name], female   DOB: [birth year], 44 y.o.   MRN: [id number]

## 2014-03-01 NOTE — Assessment & Plan Note (Signed)
Better on diet

## 2014-03-02 ENCOUNTER — Telehealth: Payer: Self-pay | Admitting: Internal Medicine

## 2014-03-02 NOTE — Telephone Encounter (Signed)
emmi emailed °

## 2014-03-24 ENCOUNTER — Encounter: Payer: Self-pay | Admitting: Obstetrics and Gynecology

## 2014-03-24 ENCOUNTER — Ambulatory Visit (INDEPENDENT_AMBULATORY_CARE_PROVIDER_SITE_OTHER): Payer: 59 | Admitting: Obstetrics and Gynecology

## 2014-03-24 VITALS — BP 100/60 | Ht 62.0 in | Wt 144.0 lb

## 2014-03-24 DIAGNOSIS — N939 Abnormal uterine and vaginal bleeding, unspecified: Secondary | ICD-10-CM | POA: Diagnosis not present

## 2014-03-24 MED ORDER — MEGESTROL ACETATE 40 MG PO TABS: 40.0000 mg | ORAL_TABLET | Freq: Three times a day (TID) | ORAL | Status: DC

## 2014-03-24 NOTE — Progress Notes (Signed)
Patient ID: [name], female   DOB: 1970/04/10, 44 y.o.   MRN: [id number] Pt here today for bleeding for a month straight. Pt states that she has bled non stop for over 3 weeks.    [facility] Visit  Patient name: [name] [name] MRN [id number]  Date of birth: May 05, 1970  CC & HPI:  [name] [name] is a 44 y.o. female presenting today for bleeding x 3 wks.   ROS:    Pertinent History Reviewed:   Reviewed: Significant for Medical         Past Medical History  Diagnosis Date  . LBP (low back pain)     Dr. Mina Marble  . GERD (gastroesophageal reflux disease)   . Anxiety   . Crohn's   . Bartholin cyst 2008    vag.  [name] Perianal abscess 2009  . Vitamin B12 deficiency   . Arthritis     Dr. Eddie Dibbles  . Elevated glucose 2010  . Kidney stone   . BIPOLAR AFFECTIVE DISORDER 04/14/2007  . Depression                               Surgical Hx:    Past Surgical History  Procedure Laterality Date  . Elbow surgery      left  . Rectovaginal fistula closure      did not help   Medications: Reviewed & Updated - see associated section                       Current outpatient prescriptions:  .  ALPRAZolam (XANAX) 0.5 MG tablet, Take 1 tablet (0.5 mg total) by mouth at bedtime as needed., Disp: 30 tablet, Rfl: 2 .  benazepril-hydrochlorthiazide (LOTENSIN HCT) 20-12.5 MG per tablet, TAKE ONE TABLET BY MOUTH ONCE EVERY DAY, Disp: 30 tablet, Rfl: 5 .  Cholecalciferol (VITAMIN D) 2000 UNITS tablet, Take 5,000 Units by mouth daily. , Disp: , Rfl:  .  cyanocobalamin (,VITAMIN B-12,) 1000 MCG/ML injection, Inject 1 mL (1,000 mcg total) into the skin every 14 (fourteen) days., Disp: 10 mL, Rfl: 3 .  cyclobenzaprine (FLEXERIL) 10 MG tablet, Take one half to one tablet by mouth every day as needed for muscle spasms, Disp: 30 tablet, Rfl: 0 .  HYDROcodone-acetaminophen (NORCO) 10-325 MG per tablet, Take 1 tablet by mouth every 6 (six) hours as needed for severe pain. Please fill on or after  05/02/14, Disp: 120 tablet, Rfl: 0 .  ketorolac (TORADOL) 10 MG tablet, TAKE 1 TABLET BY MOUTH EVERY 6 HOURS AS NEEDED FOR PAIN, Disp: 20 tablet, Rfl: 1 .  lamoTRIgine (LAMICTAL) 100 MG tablet, Take 100 mg by mouth daily.  , Disp: , Rfl:  .  Lorcaserin HCl (BELVIQ) 10 MG TABS, Take 1 tablet by mouth 2 (two) times daily., Disp: 60 tablet, Rfl: 2 .  meperidine (DEMEROL) 50 MG tablet, Take 1 tablet (50 mg total) by mouth daily as needed. For sever pain, Disp: 20 tablet, Rfl: 0 .  norethindrone (AYGESTIN) 5 MG tablet, TAKE ONE TABLET BY MOUTH ONCE EVERY DAY, Disp: 30 tablet, Rfl: 11 .  pantoprazole (PROTONIX) 40 MG tablet, Take 1 tablet (40 mg total) by mouth daily., Disp: 30 tablet, Rfl: 11 .  promethazine (PHENERGAN) 25 MG tablet, Take 1 tablet (25 mg total) by mouth 2 (two) times daily as needed for nausea., Disp: 60 tablet, Rfl: 5 .  sertraline (ZOLOFT) 100  MG tablet, Take 100 mg by mouth 2 (two) times daily.  , Disp: , Rfl:  .  SYRINGE-NEEDLE, DISP, 3 ML (B-D 3CC LUER-LOK SYR 25GX5/8") 25G X 5/8" 3 ML MISC, As directed, Disp: 50 each, Rfl: 2 .  ciprofloxacin (CIPRO) 500 MG tablet, Take 1 tablet (500 mg total) by mouth 2 (two) times daily. (Patient not taking: Reported on 03/24/2014), Disp: 20 tablet, Rfl: 0 .  fluconazole (DIFLUCAN) 150 MG tablet, Take 1 tablet (150 mg total) by mouth every 3 (three) days. (Patient not taking: Reported on 03/24/2014), Disp: 6 tablet, Rfl: 6 .  nystatin-triamcinolone ointment (MYCOLOG), Apply topically 2 (two) times daily. (Patient not taking: Reported on 03/24/2014), Disp: 30 g, Rfl: 6   Social History: Reviewed -  reports that she has been smoking Cigarettes.  She has a 10 pack-year smoking history. She has never used smokeless tobacco.  Objective Findings:  Vitals: Blood pressure 100/60, height 5' 2"  (1.575 m), weight 144 lb (65.318 kg), last menstrual period 03/03/2014. Cervix is firm and hard from LEEP.  Physical Examination: General appearance - alert, well  appearing, and in no distress, oriented to person, place, and time and normal appearing weight Mental status - alert, oriented to person, place, and time, normal mood, behavior, speech, dress, motor activity, and thought processes Abdomen - soft, nontender, nondistended, no masses or organomegaly Pelvic - normal external genitalia, vulva, vagina, cervix, uterus and adnexa, CERVIX: normal appearing cervix without discharge or lesions, s/p LEEP for abnormal pap, ADNEXA: normal adnexa in size, nontender and no masses   Assessment & Plan:   A:  1. aub  P:  1. Megace 40 tid x45 tabs 2.pelvic u/s 1wk

## 2014-03-26 ENCOUNTER — Telehealth: Payer: Self-pay | Admitting: *Deleted

## 2014-03-26 NOTE — Telephone Encounter (Signed)
Pt states that the megace is making her throw up. Pt was advised to stop taking the medication and that I would have to discuss this problem with Dr. Glo Herring on Monday when he is back in the office. Pt verbalized understanding.

## 2014-03-29 ENCOUNTER — Telehealth: Payer: Self-pay | Admitting: Obstetrics and Gynecology

## 2014-03-29 NOTE — Telephone Encounter (Signed)
Pt aware that message has been sent to Dr. Glo Herring and that he is aware of the situation and is looking into what to do next. Pt verbalized understanding.

## 2014-03-30 NOTE — Telephone Encounter (Signed)
Pt has discontinued the megace. Bleeding lightened up and has persisted but lighter. Pt has u/s scheduled for next Monday. Will discuss further at followup appt Monday.

## 2014-04-05 ENCOUNTER — Other Ambulatory Visit: Payer: Medicare Other

## 2014-04-05 ENCOUNTER — Ambulatory Visit: Payer: Medicare Other | Admitting: Obstetrics and Gynecology

## 2014-04-06 ENCOUNTER — Other Ambulatory Visit: Payer: Self-pay | Admitting: Internal Medicine

## 2014-04-09 ENCOUNTER — Ambulatory Visit: Payer: Medicare Other | Admitting: Obstetrics and Gynecology

## 2014-04-09 ENCOUNTER — Other Ambulatory Visit: Payer: Medicare Other

## 2014-04-12 ENCOUNTER — Ambulatory Visit (INDEPENDENT_AMBULATORY_CARE_PROVIDER_SITE_OTHER): Payer: 59 | Admitting: Obstetrics and Gynecology

## 2014-04-12 ENCOUNTER — Ambulatory Visit (INDEPENDENT_AMBULATORY_CARE_PROVIDER_SITE_OTHER): Payer: 59

## 2014-04-12 ENCOUNTER — Encounter: Payer: Self-pay | Admitting: Obstetrics and Gynecology

## 2014-04-12 VITALS — BP 100/60 | Ht 62.0 in | Wt 146.0 lb

## 2014-04-12 DIAGNOSIS — N939 Abnormal uterine and vaginal bleeding, unspecified: Secondary | ICD-10-CM | POA: Diagnosis not present

## 2014-04-12 DIAGNOSIS — N828 Other female genital tract fistulae: Secondary | ICD-10-CM

## 2014-04-12 DIAGNOSIS — N824 Other female intestinal-genital tract fistulae: Secondary | ICD-10-CM

## 2014-04-12 NOTE — Progress Notes (Signed)
Patient ID: [name] [name], female   DOB: [birth year], 44 y.o.   MRN: [id number]   [facility] Visit  Patient name: [name] [name] MRN [id number]  Date of birth: 1971-02-01  CC & HPI:  [name] [name] is a 44 y.o. female presenting today for follow-up and discussion of AUB, with pt noncompliant with megace therapy, she never filled the rx.  She stopped bleeding last week.  She had u/s today which showed thin uterine lining and was negative for ovarian cysts.   Pt requesting info on Nexplanon. She currently takes Northindrone,( Micronor).   ROS:  A complete 10 system review of systems was obtained and all systems are negative except as noted in the HPI and PMH.   Pertinent History Reviewed:   Reviewed Medical         Past Medical History  Diagnosis Date   LBP (low back pain)     Dr. Mina Marble   GERD (gastroesophageal reflux disease)    Anxiety    Crohn's    Bartholin cyst 2008    vag.   Perianal abscess 2009   Vitamin B12 deficiency    Arthritis     Dr. Eddie Dibbles   Elevated glucose 2010   Kidney stone    BIPOLAR AFFECTIVE DISORDER 04/14/2007   Depression                               Surgical Hx:    Past Surgical History  Procedure Laterality Date   Elbow surgery      left   Rectovaginal fistula closure      did not help   Medications: Reviewed & Updated - see associated section                       Current outpatient prescriptions:    ALPRAZolam (XANAX) 0.5 MG tablet, Take 1 tablet (0.5 mg total) by mouth at bedtime as needed., Disp: 30 tablet, Rfl: 2   benazepril-hydrochlorthiazide (LOTENSIN HCT) 20-12.5 MG per tablet, TAKE ONE TABLET BY MOUTH ONCE EVERY DAY, Disp: 30 tablet, Rfl: 5   Cholecalciferol (VITAMIN D) 2000 UNITS tablet, Take 5,000 Units by mouth daily. , Disp: , Rfl:    ciprofloxacin (CIPRO) 500 MG tablet, Take 1 tablet (500 mg total) by mouth 2 (two) times daily., Disp: 20 tablet, Rfl: 0   cyanocobalamin (,VITAMIN B-12,) 1000 MCG/ML  injection, Inject 1 mL (1,000 mcg total) into the skin every 14 (fourteen) days., Disp: 10 mL, Rfl: 3   cyclobenzaprine (FLEXERIL) 10 MG tablet, TAKE ONE HALF TO ONE TABLET BY MOUTH EVERY DAY AS NEEDED FOR MUSCLE SPASMS, Disp: 30 tablet, Rfl: 0   fluconazole (DIFLUCAN) 150 MG tablet, Take 1 tablet (150 mg total) by mouth every 3 (three) days., Disp: 6 tablet, Rfl: 6   HYDROcodone-acetaminophen (NORCO) 10-325 MG per tablet, Take 1 tablet by mouth every 6 (six) hours as needed for severe pain. Please fill on or after 05/02/14, Disp: 120 tablet, Rfl: 0   ketorolac (TORADOL) 10 MG tablet, TAKE 1 TABLET BY MOUTH EVERY 6 HOURS AS NEEDED FOR PAIN, Disp: 20 tablet, Rfl: 1   lamoTRIgine (LAMICTAL) 100 MG tablet, Take 100 mg by mouth daily.  , Disp: , Rfl:    Lorcaserin HCl (BELVIQ) 10 MG TABS, Take 1 tablet by mouth 2 (two) times daily., Disp: 60 tablet, Rfl: 2   megestrol (MEGACE) 40 MG tablet, Take  1 tablet (40 mg total) by mouth 3 (three) times daily., Disp: 45 tablet, Rfl: 2   meperidine (DEMEROL) 50 MG tablet, Take 1 tablet (50 mg total) by mouth daily as needed. For sever pain, Disp: 20 tablet, Rfl: 0   norethindrone (AYGESTIN) 5 MG tablet, TAKE ONE TABLET BY MOUTH ONCE EVERY DAY, Disp: 30 tablet, Rfl: 11   nystatin-triamcinolone ointment (MYCOLOG), Apply topically 2 (two) times daily., Disp: 30 g, Rfl: 6   pantoprazole (PROTONIX) 40 MG tablet, Take 1 tablet (40 mg total) by mouth daily., Disp: 30 tablet, Rfl: 11   promethazine (PHENERGAN) 25 MG tablet, Take 1 tablet (25 mg total) by mouth 2 (two) times daily as needed for nausea., Disp: 60 tablet, Rfl: 5   sertraline (ZOLOFT) 100 MG tablet, Take 100 mg by mouth 2 (two) times daily.  , Disp: , Rfl:    SYRINGE-NEEDLE, DISP, 3 ML (B-D 3CC LUER-LOK SYR 25GX5/8") 25G X 5/8" 3 ML MISC, As directed, Disp: 50 each, Rfl: 2   Social History: Reviewed -  reports that she has been smoking Cigarettes.  She has a 10 pack-year smoking history. She has  never used smokeless tobacco.  Objective Findings:  Vitals: Blood pressure 100/60, height 5' 2"  (1.575 m), weight 146 lb (66.225 kg), last menstrual period 03/03/2014.  Physical Examination: Discussion SEe u/s report Assessment & Plan:   A:  1. Vaginal bleeding stopped 2 days ago 2. Uterus lining thin; no abnormalities or cysts 3. Discussed birth control options and side effects  P:  1. Resume Micronor 2. Follow-up in 4 months for routine pap    This chart was scribed for Jonnie Kind, MD by Tula Nakayama, ED Scribe. This patient was seen in room 2 and the patient's care was started at 11:34 AM.   I personally performed the services described in this documentation, which was scribed in my presence. The recorded information has been reviewed and considered accurate. It has been edited as necessary during review. Jonnie Kind, MD

## 2014-05-03 ENCOUNTER — Other Ambulatory Visit: Payer: Self-pay | Admitting: Internal Medicine

## 2014-05-03 ENCOUNTER — Encounter: Payer: Self-pay | Admitting: Internal Medicine

## 2014-05-04 ENCOUNTER — Other Ambulatory Visit: Payer: Self-pay | Admitting: Internal Medicine

## 2014-05-07 ENCOUNTER — Other Ambulatory Visit: Payer: Self-pay | Admitting: Internal Medicine

## 2014-05-07 MED ORDER — LORCASERIN HCL 10 MG PO TABS: 1.0000 | ORAL_TABLET | Freq: Two times a day (BID) | ORAL | Status: DC

## 2014-05-11 ENCOUNTER — Other Ambulatory Visit: Payer: Self-pay | Admitting: Internal Medicine

## 2014-05-18 DIAGNOSIS — J209 Acute bronchitis, unspecified: Secondary | ICD-10-CM | POA: Diagnosis not present

## 2014-05-18 DIAGNOSIS — J069 Acute upper respiratory infection, unspecified: Secondary | ICD-10-CM | POA: Diagnosis not present

## 2014-05-31 ENCOUNTER — Encounter: Payer: Self-pay | Admitting: Internal Medicine

## 2014-05-31 ENCOUNTER — Ambulatory Visit (INDEPENDENT_AMBULATORY_CARE_PROVIDER_SITE_OTHER): Payer: 59 | Admitting: Internal Medicine

## 2014-05-31 VITALS — BP 100/66 | HR 104 | Wt 149.0 lb

## 2014-05-31 DIAGNOSIS — J0100 Acute maxillary sinusitis, unspecified: Secondary | ICD-10-CM | POA: Diagnosis not present

## 2014-05-31 DIAGNOSIS — K612 Anorectal abscess: Secondary | ICD-10-CM | POA: Diagnosis not present

## 2014-05-31 DIAGNOSIS — R635 Abnormal weight gain: Secondary | ICD-10-CM

## 2014-05-31 DIAGNOSIS — I10 Essential (primary) hypertension: Secondary | ICD-10-CM | POA: Diagnosis not present

## 2014-05-31 DIAGNOSIS — K50913 Crohn's disease, unspecified, with fistula: Secondary | ICD-10-CM | POA: Diagnosis not present

## 2014-05-31 DIAGNOSIS — G43909 Migraine, unspecified, not intractable, without status migrainosus: Secondary | ICD-10-CM | POA: Diagnosis not present

## 2014-05-31 DIAGNOSIS — M5440 Lumbago with sciatica, unspecified side: Secondary | ICD-10-CM

## 2014-05-31 DIAGNOSIS — J019 Acute sinusitis, unspecified: Secondary | ICD-10-CM | POA: Insufficient documentation

## 2014-05-31 DIAGNOSIS — K449 Diaphragmatic hernia without obstruction or gangrene: Secondary | ICD-10-CM

## 2014-05-31 MED ORDER — HYDROCODONE-ACETAMINOPHEN 10-325 MG PO TABS: 1.0000 | ORAL_TABLET | Freq: Four times a day (QID) | ORAL | Status: DC | PRN

## 2014-05-31 MED ORDER — MEPERIDINE HCL 50 MG PO TABS: 50.0000 mg | ORAL_TABLET | Freq: Every day | ORAL | Status: DC | PRN

## 2014-05-31 MED ORDER — METHYLPREDNISOLONE ACETATE 80 MG/ML IJ SUSP
80.0000 mg | Freq: Once | INTRAMUSCULAR | Status: AC
  Administered 2014-05-31: 80 mg via INTRAMUSCULAR

## 2014-05-31 MED ORDER — KETOROLAC TROMETHAMINE 10 MG PO TABS: ORAL_TABLET | ORAL | Status: DC

## 2014-05-31 MED ORDER — AZITHROMYCIN 250 MG PO TABS: ORAL_TABLET | ORAL | Status: DC

## 2014-05-31 MED ORDER — "SYRINGE/NEEDLE (DISP) 25G X 5/8"" 3 ML MISC": Status: DC

## 2014-05-31 NOTE — Progress Notes (Signed)
   Subjective:    HPI  C/o sinus drainage  The patient is here to follow up on chronic depression, anxiety, headaches and chronic moderate to severe LBP symptoms  controlled with medicinesand exercise most of the time. F/u Crohn's. [name] has started to take a psychology class at college - 2015  F/u aches, fatigue - chronic F/u HTN - still not taking Lotensin  BP Readings from Last 3 Encounters:  05/31/14 100/66  04/12/14 100/60  03/24/14 100/60   Wt Readings from Last 3 Encounters:  05/31/14 149 lb (67.586 kg)  04/12/14 146 lb (66.225 kg)  03/24/14 144 lb (65.318 kg)       Review of Systems  Constitutional: Positive for fatigue. Negative for chills, activity change, appetite change and unexpected weight change.  HENT: Negative for congestion, mouth sores and sinus pressure.   Eyes: Negative for visual disturbance.  Respiratory: Negative for cough and chest tightness.   Gastrointestinal: Positive for abdominal pain and blood in stool. Negative for nausea.  Genitourinary: Negative for frequency, difficulty urinating and vaginal pain.  Musculoskeletal: Positive for back pain. Negative for gait problem.  Skin: Negative for pallor and rash.  Neurological: Negative for dizziness, tremors, weakness, numbness and headaches.  Psychiatric/Behavioral: Positive for decreased concentration. Negative for suicidal ideas, confusion, sleep disturbance and dysphoric mood. The patient is nervous/anxious.        Objective:   Physical Exam  Constitutional: She appears well-developed. No distress.  obese  HENT:  Head: Normocephalic.  Right Ear: External ear normal.  Left Ear: External ear normal.  Nose: Nose normal.  Mouth/Throat: Oropharynx is clear and moist.  Eyes: Conjunctivae are normal. Pupils are equal, round, and reactive to light. Right eye exhibits no discharge. Left eye exhibits no discharge.  Neck: Normal range of motion. Neck supple. No JVD present. No tracheal deviation  present. No thyromegaly present.  Cardiovascular: Normal rate, regular rhythm and normal heart sounds.   Pulmonary/Chest: No stridor. No respiratory distress. She has no wheezes.  Abdominal: Soft. Bowel sounds are normal. She exhibits no distension and no mass. There is tenderness. There is no rebound and no guarding.  Musculoskeletal: She exhibits tenderness (LS is tender w/ROM). She exhibits no edema.  LS/thor back is tender to palp  Lymphadenopathy:    She has no cervical adenopathy.  Neurological: She displays normal reflexes. No cranial nerve deficit. She exhibits normal muscle tone. Coordination normal.  Skin: No rash noted. No erythema.  Psychiatric: She has a normal mood and affect. Her behavior is normal. Judgment and thought content normal.  Edentulous - dentures   Lab Results  Component Value Date   WBC 8.4 03/03/2012   HGB 12.6 03/03/2012   HCT 38.1 03/03/2012   PLT 180.0 03/03/2012   GLUCOSE 98 03/03/2012   CHOL 233* 05/01/2011   TRIG 217.0* 05/01/2011   HDL 32.50* 05/01/2011   LDLDIRECT 171.9 05/01/2011   LDLCALC 115* 03/17/2007   ALT 10 05/01/2011   AST 10 05/01/2011   NA 138 03/03/2012   K 4.3 03/03/2012   CL 106 03/03/2012   CREATININE 1.0 03/03/2012   BUN 13 03/03/2012   CO2 26 03/03/2012   TSH 2.07 03/03/2012          Assessment & Plan:

## 2014-05-31 NOTE — Addendum Note (Signed)
Addended by: Cresenciano Lick on: 05/31/2014 11:25 AM   Modules accepted: Orders

## 2014-05-31 NOTE — Progress Notes (Signed)
Pre visit review using our clinic review tool, if applicable. No additional management support is needed unless otherwise documented below in the visit note. 

## 2014-05-31 NOTE — Assessment & Plan Note (Signed)
Wt Readings from Last 3 Encounters:  05/31/14 149 lb (67.586 kg)  04/12/14 146 lb (66.225 kg)  03/24/14 144 lb (65.318 kg)

## 2014-05-31 NOTE — Assessment & Plan Note (Signed)
Lamictal, Zoloft

## 2014-05-31 NOTE — Assessment & Plan Note (Signed)
On Rx: Norco; Toradol, Demerol prn

## 2014-05-31 NOTE — Assessment & Plan Note (Signed)
Zpac Depomedrol im Zyrtec

## 2014-05-31 NOTE — Assessment & Plan Note (Signed)
LOtensin HCT qd

## 2014-05-31 NOTE — Assessment & Plan Note (Signed)
On Protonix 

## 2014-05-31 NOTE — Assessment & Plan Note (Signed)
Chronic abd pain H/o recurrent fistula - Cipro prn Demerol po rare for severe pain   Potential benefits of a long term opioids use as well as potential risks (i.e. addiction risk, apnea etc) and complications (i.e. Somnolence, constipation and others) were explained to the patient and were aknowledged.

## 2014-06-21 ENCOUNTER — Encounter: Payer: Self-pay | Admitting: Internal Medicine

## 2014-07-12 ENCOUNTER — Encounter: Payer: Self-pay | Admitting: Internal Medicine

## 2014-07-21 ENCOUNTER — Other Ambulatory Visit: Payer: Self-pay | Admitting: Internal Medicine

## 2014-08-30 ENCOUNTER — Ambulatory Visit (INDEPENDENT_AMBULATORY_CARE_PROVIDER_SITE_OTHER): Payer: 59 | Admitting: Internal Medicine

## 2014-08-30 ENCOUNTER — Other Ambulatory Visit (INDEPENDENT_AMBULATORY_CARE_PROVIDER_SITE_OTHER): Payer: 59

## 2014-08-30 ENCOUNTER — Encounter: Payer: Self-pay | Admitting: Internal Medicine

## 2014-08-30 ENCOUNTER — Telehealth: Payer: Self-pay | Admitting: *Deleted

## 2014-08-30 VITALS — BP 110/64 | HR 97 | Wt 149.0 lb

## 2014-08-30 DIAGNOSIS — I10 Essential (primary) hypertension: Secondary | ICD-10-CM

## 2014-08-30 DIAGNOSIS — E538 Deficiency of other specified B group vitamins: Secondary | ICD-10-CM

## 2014-08-30 DIAGNOSIS — K612 Anorectal abscess: Secondary | ICD-10-CM | POA: Diagnosis not present

## 2014-08-30 DIAGNOSIS — F3181 Bipolar II disorder: Secondary | ICD-10-CM | POA: Diagnosis not present

## 2014-08-30 DIAGNOSIS — F411 Generalized anxiety disorder: Secondary | ICD-10-CM

## 2014-08-30 DIAGNOSIS — M5440 Lumbago with sciatica, unspecified side: Secondary | ICD-10-CM

## 2014-08-30 LAB — CBC WITH DIFFERENTIAL/PLATELET
Basophils Absolute: 0 10*3/uL (ref 0.0–0.1)
Basophils Relative: 0.1 % (ref 0.0–3.0)
Eosinophils Absolute: 0.1 10*3/uL (ref 0.0–0.7)
Eosinophils Relative: 0.8 % (ref 0.0–5.0)
HCT: 40.9 % (ref 36.0–46.0)
Hemoglobin: 14.1 g/dL (ref 12.0–15.0)
Lymphocytes Relative: 19.1 % (ref 12.0–46.0)
Lymphs Abs: 1.9 10*3/uL (ref 0.7–4.0)
MCHC: 34.4 g/dL (ref 30.0–36.0)
MCV: 88.4 fl (ref 78.0–100.0)
Monocytes Absolute: 0.4 10*3/uL (ref 0.1–1.0)
Monocytes Relative: 4.3 % (ref 3.0–12.0)
Neutro Abs: 7.6 10*3/uL (ref 1.4–7.7)
Neutrophils Relative %: 75.7 % (ref 43.0–77.0)
Platelets: 175 10*3/uL (ref 150.0–400.0)
RBC: 4.63 Mil/uL (ref 3.87–5.11)
RDW: 13.4 % (ref 11.5–15.5)
WBC: 10 10*3/uL (ref 4.0–10.5)

## 2014-08-30 LAB — LIPID PANEL
Cholesterol: 168 mg/dL (ref 0–200)
HDL: 27.4 mg/dL — ABNORMAL LOW (ref >39.00)
LDL Cholesterol: 121 mg/dL — ABNORMAL HIGH (ref 0–99)
NonHDL: 140.6
Total CHOL/HDL Ratio: 6
Triglycerides: 99 mg/dL (ref 0.0–149.0)
VLDL: 19.8 mg/dL (ref 0.0–40.0)

## 2014-08-30 LAB — BASIC METABOLIC PANEL
BUN: 12 mg/dL (ref 6–23)
CO2: 27 mEq/L (ref 19–32)
Calcium: 9.5 mg/dL (ref 8.4–10.5)
Chloride: 103 mEq/L (ref 96–112)
Creatinine, Ser: 0.98 mg/dL (ref 0.40–1.20)
GFR: 65.31 mL/min (ref >60.00)
Glucose, Bld: 90 mg/dL (ref 70–99)
Potassium: 3.4 mEq/L — ABNORMAL LOW (ref 3.5–5.1)
Sodium: 138 mEq/L (ref 135–145)

## 2014-08-30 LAB — HEPATIC FUNCTION PANEL
ALT: 10 U/L (ref 0–35)
AST: 11 U/L (ref 0–37)
Albumin: 4.3 g/dL (ref 3.5–5.2)
Alkaline Phosphatase: 71 U/L (ref 39–117)
Bilirubin, Direct: 0.1 mg/dL (ref 0.0–0.3)
Total Bilirubin: 0.3 mg/dL (ref 0.2–1.2)
Total Protein: 7 g/dL (ref 6.0–8.3)

## 2014-08-30 LAB — TSH: TSH: 1.83 u[IU]/mL (ref 0.35–4.50)

## 2014-08-30 LAB — VITAMIN B12: Vitamin B-12: 276 pg/mL (ref 211–911)

## 2014-08-30 LAB — VITAMIN D 25 HYDROXY (VIT D DEFICIENCY, FRACTURES): VITD: 131.46 ng/mL (ref 30.00–100.00)

## 2014-08-30 MED ORDER — CYCLOBENZAPRINE HCL 10 MG PO TABS: ORAL_TABLET | ORAL | Status: DC

## 2014-08-30 MED ORDER — HYDROCODONE-ACETAMINOPHEN 10-325 MG PO TABS: 1.0000 | ORAL_TABLET | Freq: Four times a day (QID) | ORAL | Status: DC | PRN

## 2014-08-30 MED ORDER — MEPERIDINE HCL 50 MG PO TABS: 50.0000 mg | ORAL_TABLET | Freq: Every day | ORAL | Status: DC | PRN

## 2014-08-30 MED ORDER — KETOROLAC TROMETHAMINE 10 MG PO TABS: ORAL_TABLET | ORAL | Status: DC

## 2014-08-30 NOTE — Progress Notes (Signed)
Pre visit review using our clinic review tool, if applicable. No additional management support is needed unless otherwise documented below in the visit note. 

## 2014-08-30 NOTE — Telephone Encounter (Signed)
Rec call from [facility] in [place] lab- Pt's Vit D is critical at 131.46. PCP informed. He advises pt to d/c Vit D. Pt informed to d/c Vit D until next OV or lab check.

## 2014-08-30 NOTE — Assessment & Plan Note (Signed)
On B12 

## 2014-08-30 NOTE — Progress Notes (Signed)
Subjective:   CC: No chief complaint on file.   HPI [name] [name] presents for a follow up on chronic depression, anxiety, headaches and chronic moderate to severe LBP symptoms controlled with medicinesand exercise most of the time. F/u Crohn's. [name] [name] has started to take a psychology class at college - 2015 - still taking... Tobacco discussed  Outpatient Prescriptions Prior to Visit  Medication Sig Dispense Refill  . ALPRAZolam (XANAX) 0.5 MG tablet Take 1 tablet (0.5 mg total) by mouth at bedtime as needed. 30 tablet 2  . benazepril-hydrochlorthiazide (LOTENSIN HCT) 20-12.5 MG per tablet TAKE ONE TABLET BY MOUTH ONCE EVERY DAY 30 tablet 11  . Cholecalciferol (VITAMIN D) 2000 UNITS tablet Take 5,000 Units by mouth daily.     . cyanocobalamin (,VITAMIN B-12,) 1000 MCG/ML injection Inject 1 mL (1,000 mcg total) into the skin every 14 (fourteen) days. 10 mL 3  . fluconazole (DIFLUCAN) 150 MG tablet Take 1 tablet (150 mg total) by mouth every 3 (three) days. 6 tablet 6  . lamoTRIgine (LAMICTAL) 100 MG tablet Take 100 mg by mouth daily.      . norethindrone (AYGESTIN) 5 MG tablet TAKE ONE TABLET BY MOUTH ONCE EVERY DAY 30 tablet 11  . nystatin-triamcinolone ointment (MYCOLOG) Apply topically 2 (two) times daily. 30 g 6  . promethazine (PHENERGAN) 25 MG tablet Take 1 tablet (25 mg total) by mouth 2 (two) times daily as needed for nausea. 60 tablet 5  . sertraline (ZOLOFT) 100 MG tablet Take 100 mg by mouth 2 (two) times daily.      . SYRINGE-NEEDLE, DISP, 3 ML (B-D 3CC LUER-LOK SYR 25GX5/8") 25G X 5/8" 3 ML MISC As directed 50 each 2  . azithromycin (ZITHROMAX Z-PAK) 250 MG tablet As direted 6 each 0  . ciprofloxacin (CIPRO) 500 MG tablet Take 1 tablet (500 mg total) by mouth 2 (two) times daily. 20 tablet 0  . cyclobenzaprine (FLEXERIL) 10 MG tablet TAKE ONE HALF TO ONE TABLET BY MOUTH EVERY DAY AS NEEDED FOR MUSCLE SPASMS 30 tablet 1  . HYDROcodone-acetaminophen (NORCO) 10-325 MG per tablet  Take 1 tablet by mouth every 6 (six) hours as needed for severe pain. Please fill on or after 08/02/14 120 tablet 0  . ketorolac (TORADOL) 10 MG tablet TAKE 1 TABLET BY MOUTH EVERY 6 HOURS AS NEEDED FOR PAIN 20 tablet 1  . megestrol (MEGACE) 40 MG tablet Take 1 tablet (40 mg total) by mouth 3 (three) times daily. 45 tablet 2  . meperidine (DEMEROL) 50 MG tablet Take 1 tablet (50 mg total) by mouth daily as needed. For sever pain 20 tablet 0  . pantoprazole (PROTONIX) 40 MG tablet Take 1 tablet (40 mg total) by mouth daily. 30 tablet 11  . Lorcaserin HCl (BELVIQ) 10 MG TABS Take 1 tablet by mouth 2 (two) times daily. (Patient not taking: Reported on 08/30/2014) 60 tablet 2   No facility-administered medications prior to visit.    ROS Review of Systems  Constitutional: Positive for fatigue. Negative for chills, activity change, appetite change and unexpected weight change.  HENT: Negative for congestion, mouth sores and sinus pressure.   Eyes: Negative for visual disturbance.  Respiratory: Negative for cough and chest tightness.   Gastrointestinal: Positive for abdominal pain and diarrhea. Negative for nausea.  Genitourinary: Negative for frequency, difficulty urinating and vaginal pain.  Musculoskeletal: Positive for back pain and arthralgias. Negative for gait problem.  Skin: Negative for pallor and rash.  Neurological: Negative for dizziness,  tremors, weakness, numbness and headaches.  Psychiatric/Behavioral: Negative for confusion and sleep disturbance. The patient is nervous/anxious.     Objective:  BP 110/64 mmHg  Pulse 97  Wt 149 lb (67.586 kg)  SpO2 98%  BP Readings from Last 3 Encounters:  08/30/14 110/64  05/31/14 100/66  04/12/14 100/60    Wt Readings from Last 3 Encounters:  08/30/14 149 lb (67.586 kg)  05/31/14 149 lb (67.586 kg)  04/12/14 146 lb (66.225 kg)    Physical Exam  Constitutional: She appears well-developed. No distress.  Obese  HENT:  Head:  Normocephalic.  Right Ear: External ear normal.  Left Ear: External ear normal.  Nose: Nose normal.  Mouth/Throat: Oropharynx is clear and moist.  Eyes: Conjunctivae are normal. Pupils are equal, round, and reactive to light. Right eye exhibits no discharge. Left eye exhibits no discharge.  Neck: Normal range of motion. Neck supple. No JVD present. No tracheal deviation present. No thyromegaly present.  Cardiovascular: Normal rate, regular rhythm and normal heart sounds.   Pulmonary/Chest: No stridor. No respiratory distress. She has no wheezes.  Abdominal: Soft. Bowel sounds are normal. She exhibits no distension and no mass. There is no tenderness. There is no rebound and no guarding.  Musculoskeletal: She exhibits tenderness. She exhibits no edema.  Lymphadenopathy:    She has no cervical adenopathy.  Neurological: She displays normal reflexes. No cranial nerve deficit. She exhibits normal muscle tone. Coordination normal.  Skin: No rash noted. No erythema.  Psychiatric: She has a normal mood and affect. Her behavior is normal. Judgment and thought content normal.  LS tender  Lab Results  Component Value Date   WBC 7.7 11/30/2013   HGB 13.3 11/30/2013   HCT 39.8 11/30/2013   PLT 158.0 11/30/2013   GLUCOSE 97 11/30/2013   CHOL 233* 05/01/2011   TRIG 217.0* 05/01/2011   HDL 32.50* 05/01/2011   LDLDIRECT 171.9 05/01/2011   LDLCALC 115* 03/17/2007   ALT 13 11/30/2013   AST 13 11/30/2013   NA 138 11/30/2013   K 4.4 11/30/2013   CL 105 11/30/2013   CREATININE 0.9 11/30/2013   BUN 17 11/30/2013   CO2 27 11/30/2013   TSH 1.58 11/30/2013    No results found.  Assessment & Plan:   Diagnoses and all orders for this visit:  Abscess of anal and rectal regions Orders: -     meperidine (DEMEROL) 50 MG tablet; Take 1 tablet (50 mg total) by mouth daily as needed. For sever pain  Essential hypertension, benign  B12 nutritional deficiency  Midline low back pain with sciatica,  sciatica laterality unspecified  Anxiety state  Other orders -     Discontinue: HYDROcodone-acetaminophen (NORCO) 10-325 MG per tablet; Take 1 tablet by mouth every 6 (six) hours as needed for severe pain. Please fill on or after 09/01/14 -     ketorolac (TORADOL) 10 MG tablet; TAKE 1 TABLET BY MOUTH EVERY 6 HOURS AS NEEDED FOR PAIN -     cyclobenzaprine (FLEXERIL) 10 MG tablet; TAKE ONE HALF TO ONE TABLET BY MOUTH EVERY DAY AS NEEDED FOR MUSCLE SPASMS -     Discontinue: HYDROcodone-acetaminophen (NORCO) 10-325 MG per tablet; Take 1 tablet by mouth every 6 (six) hours as needed for severe pain. Please fill on or after 10/02/14 -     HYDROcodone-acetaminophen (NORCO) 10-325 MG per tablet; Take 1 tablet by mouth every 6 (six) hours as needed for severe pain. Please fill on or after 11/02/14   I have  discontinued Ms. Leder's ciprofloxacin, pantoprazole, megestrol, Lorcaserin HCl, azithromycin, HYDROcodone-acetaminophen, and HYDROcodone-acetaminophen. I have also changed her HYDROcodone-acetaminophen. Additionally, I am having her maintain her Vitamin D, sertraline, lamoTRIgine, ALPRAZolam, promethazine, fluconazole, nystatin-triamcinolone ointment, norethindrone, cyanocobalamin, SYRINGE-NEEDLE (DISP) 3 ML, benazepril-hydrochlorthiazide, ketorolac, meperidine, and cyclobenzaprine.  Meds ordered this encounter  Medications  . DISCONTD: HYDROcodone-acetaminophen (NORCO) 10-325 MG per tablet    Sig: Take 1 tablet by mouth every 6 (six) hours as needed for severe pain. Please fill on or after 09/01/14    Dispense:  120 tablet    Refill:  0  . ketorolac (TORADOL) 10 MG tablet    Sig: TAKE 1 TABLET BY MOUTH EVERY 6 HOURS AS NEEDED FOR PAIN    Dispense:  20 tablet    Refill:  1  . meperidine (DEMEROL) 50 MG tablet    Sig: Take 1 tablet (50 mg total) by mouth daily as needed. For sever pain    Dispense:  20 tablet    Refill:  0  . cyclobenzaprine (FLEXERIL) 10 MG tablet    Sig: TAKE ONE HALF TO ONE  TABLET BY MOUTH EVERY DAY AS NEEDED FOR MUSCLE SPASMS    Dispense:  30 tablet    Refill:  1  . DISCONTD: HYDROcodone-acetaminophen (NORCO) 10-325 MG per tablet    Sig: Take 1 tablet by mouth every 6 (six) hours as needed for severe pain. Please fill on or after 10/02/14    Dispense:  120 tablet    Refill:  0  . HYDROcodone-acetaminophen (NORCO) 10-325 MG per tablet    Sig: Take 1 tablet by mouth every 6 (six) hours as needed for severe pain. Please fill on or after 11/02/14    Dispense:  120 tablet    Refill:  0     Follow-up: No Follow-up on file.  [name], MD

## 2014-08-30 NOTE — Assessment & Plan Note (Signed)
  Lotensin HCT 

## 2014-08-30 NOTE — Assessment & Plan Note (Signed)
Chronic  Xanax prn  Potential benefits of a long term benzodiazepines  use as well as potential risks  and complications were explained to the patient and were aknowledged. 

## 2014-08-30 NOTE — Assessment & Plan Note (Signed)
On Rx: Norco; Toradol, Demerol prn Chronic and severe On disabiity  Potential benefits of a long term opioids use as well as potential risks (i.e. addiction risk, apnea etc) and complications (i.e. Somnolence, constipation and others) were explained to the patient and were aknowledged.

## 2014-08-31 ENCOUNTER — Other Ambulatory Visit: Payer: Self-pay | Admitting: Internal Medicine

## 2014-09-06 ENCOUNTER — Other Ambulatory Visit: Payer: Medicare Other | Admitting: Obstetrics and Gynecology

## 2014-09-09 ENCOUNTER — Ambulatory Visit (INDEPENDENT_AMBULATORY_CARE_PROVIDER_SITE_OTHER): Payer: 59 | Admitting: Obstetrics and Gynecology

## 2014-09-09 ENCOUNTER — Other Ambulatory Visit (HOSPITAL_COMMUNITY)
Admission: RE | Admit: 2014-09-09 | Discharge: 2014-09-09 | Disposition: A | Discharge status: Home / Self Care | Payer: 59 | Source: Ambulatory Visit | Attending: Obstetrics and Gynecology | Admitting: Obstetrics and Gynecology

## 2014-09-09 ENCOUNTER — Encounter: Payer: Self-pay | Admitting: Obstetrics and Gynecology

## 2014-09-09 VITALS — BP 112/70 | Ht 62.0 in | Wt 149.0 lb

## 2014-09-09 DIAGNOSIS — Z1151 Encounter for screening for human papillomavirus (HPV): Secondary | ICD-10-CM | POA: Insufficient documentation

## 2014-09-09 DIAGNOSIS — Z01419 Encounter for gynecological examination (general) (routine) without abnormal findings: Secondary | ICD-10-CM | POA: Insufficient documentation

## 2014-09-09 NOTE — Progress Notes (Signed)
Patient ID: [name] [name], female   DOB: 1970-08-03, 44 y.o.   MRN: [id number] Pt here today for her annual exam. Pt denies any problems or concerns at this time. Pt had a normal pap with negative HPV on 08/31/13.

## 2014-09-09 NOTE — Progress Notes (Signed)
Patient ID: [name] [name], female   DOB: 08-27-1970, 44 y.o.   MRN: [id number]  Assessment:  Annual Gyn Exam 1. Hemorrhoid banding to be considered; with f/u with GI, given numbers 2. Pt requests annual paps 3. Pap Q1-3 yr as needed  4. Pt has family Hx of cancer; pt referred for genetic mapping to [name] Schwalbe, MS, [facility]  Plan:  1. pap smear done, next pap due 09/09/2015 2. Will continue annual paps per pt's request 3    Annual mammogram advised 4.   Discussed benefits of IUD use 5.   F/U for hemorrhoid banding; referral for GI [facility] Dr. Oneida Alar [phone]) 6.   Pap Q1-3 as needed 7.   Refer to [name] [name], [place], [facility] (oncology and genetic mapping)  Subjective:  [name] [name] is a 44 y.o. female No obstetric history on file. who presents for annual exam. No LMP recorded. Patient is not currently having periods (Reason: Oral contraceptives). The patient presents for her annual exam. Pt denies any current concerns or Sx. Pt's last pap was completed on 08/28/13 which was normal (findings: NEGATIVE FOR INTRAEPITHELIAL LESIONS OR MALIGNANCY); pt is negative for HPV (08/31/13). Pt denies any Hx of IUD use.   The following portions of the patient's history were reviewed and updated as appropriate: allergies, current medications, past family history, past medical history, past social history, past surgical history and problem list. Past Medical History  Diagnosis Date  . LBP (low back pain)     Dr. Mina Marble  . GERD (gastroesophageal reflux disease)   . Anxiety   . Crohn's   . Bartholin cyst 2008    vag.  [name] Perianal abscess 2009  . Vitamin B12 deficiency   . Arthritis     Dr. Eddie Dibbles  . Elevated glucose 2010  . Kidney stone   . BIPOLAR AFFECTIVE DISORDER 04/14/2007  . Depression     Past Surgical History  Procedure Laterality Date  . Elbow surgery      left  . Rectovaginal fistula closure      did not help     Current outpatient prescriptions:  .   ALPRAZolam (XANAX) 0.5 MG tablet, Take 1 tablet (0.5 mg total) by mouth at bedtime as needed., Disp: 30 tablet, Rfl: 2 .  benazepril-hydrochlorthiazide (LOTENSIN HCT) 20-12.5 MG per tablet, TAKE ONE TABLET BY MOUTH ONCE EVERY DAY, Disp: 30 tablet, Rfl: 11 .  cyanocobalamin (,VITAMIN B-12,) 1000 MCG/ML injection, Inject 1 mL (1,000 mcg total) into the skin every 14 (fourteen) days., Disp: 10 mL, Rfl: 3 .  cyclobenzaprine (FLEXERIL) 10 MG tablet, TAKE ONE HALF TO ONE TABLET BY MOUTH EVERY DAY AS NEEDED FOR MUSCLE SPASMS, Disp: 30 tablet, Rfl: 1 .  HYDROcodone-acetaminophen (NORCO) 10-325 MG per tablet, Take 1 tablet by mouth every 6 (six) hours as needed for severe pain. Please fill on or after 11/02/14, Disp: 120 tablet, Rfl: 0 .  ketorolac (TORADOL) 10 MG tablet, TAKE 1 TABLET BY MOUTH EVERY 6 HOURS AS NEEDED FOR PAIN, Disp: 20 tablet, Rfl: 1 .  lamoTRIgine (LAMICTAL) 100 MG tablet, Take 100 mg by mouth daily.  , Disp: , Rfl:  .  meperidine (DEMEROL) 50 MG tablet, Take 1 tablet (50 mg total) by mouth daily as needed. For sever pain, Disp: 20 tablet, Rfl: 0 .  norethindrone (AYGESTIN) 5 MG tablet, TAKE ONE TABLET BY MOUTH ONCE EVERY DAY, Disp: 30 tablet, Rfl: 11 .  sertraline (ZOLOFT) 100 MG tablet, Take 100 mg by  mouth 2 (two) times daily.  , Disp: , Rfl:  .  SYRINGE-NEEDLE, DISP, 3 ML (B-D 3CC LUER-LOK SYR 25GX5/8") 25G X 5/8" 3 ML MISC, As directed, Disp: 50 each, Rfl: 2 .  fluconazole (DIFLUCAN) 150 MG tablet, Take 1 tablet (150 mg total) by mouth every 3 (three) days. (Patient not taking: Reported on 09/09/2014), Disp: 6 tablet, Rfl: 6 .  nystatin-triamcinolone ointment (MYCOLOG), Apply topically 2 (two) times daily. (Patient not taking: Reported on 09/09/2014), Disp: 30 g, Rfl: 6 .  promethazine (PHENERGAN) 25 MG tablet, Take 1 tablet (25 mg total) by mouth 2 (two) times daily as needed for nausea. (Patient not taking: Reported on 09/09/2014), Disp: 60 tablet, Rfl: 5  Review of  Systems Constitutional: negative Gastrointestinal: negative Genitourinary: negative A complete 10 system review of systems was obtained and all systems are negative except as noted in the HPI and PMH.   Objective:  BP 112/70 mmHg  Ht 5' 2"  (1.575 m)  Wt 149 lb (67.586 kg)  BMI 27.25 kg/m2   BMI: Body mass index is 27.25 kg/(m^2).  General Appearance: Alert, appropriate appearance for age. No acute distress HEENT: Grossly normal Neck / Thyroid:  Cardiovascular: RRR; normal S1, S2, no murmur Lungs: CTA bilaterally Back: No CVAT Breast Exam: No dimpling, nipple retraction or discharge. No masses or nodes., Normal to inspection, Normal breast tissue bilaterally and No masses or nodes.No dimpling, nipple retraction or discharge. Gastrointestinal: Soft, non-tender, no masses or organomegaly Pelvic Exam:  Vulva and vagina appear normal. Bimanual exam reveals normal uterus and adnexa. External genitalia: normal general appearance Vaginal: normal mucosa without prolapse or lesions, normal without tenderness, induration or masses and normal rugae Cervix: normal appearance Adnexa: normal bimanual exam Uterus: normal single, nontender Rectal: Chronic fistula left of anus, internal hemorrhoid prolapse possibly presenting as external hemorrhoid . Negative guaiac test.  Rectovaginal: not indicated Lymphatic Exam: Non-palpable nodes in neck, clavicular, axillary, or inguinal regions  Skin: no rash or abnormalities Neurologic: Normal gait and speech, no tremor  Psychiatric: Alert and oriented, appropriate affect. Urinalysis:Not done  [name] [name]. MD Pgr [phone] 9:04 AM   This chart was SCRIBED for [name] Shirk, MD by Stephania Fragmin, ED Scribe. This patient was seen in room 1 and the patient's care was started at 9:05 AM.  I personally performed the services described in this documentation, which was SCRIBED in my presence. The recorded information has been reviewed and considered  accurate. It has been edited as necessary during review. Jonnie Kind, MD

## 2014-09-10 LAB — CYTOLOGY - PAP

## 2014-10-18 ENCOUNTER — Other Ambulatory Visit: Payer: Self-pay | Admitting: Obstetrics & Gynecology

## 2014-11-30 ENCOUNTER — Ambulatory Visit (INDEPENDENT_AMBULATORY_CARE_PROVIDER_SITE_OTHER): Payer: 59 | Admitting: Internal Medicine

## 2014-11-30 ENCOUNTER — Encounter: Payer: Self-pay | Admitting: Internal Medicine

## 2014-11-30 VITALS — BP 120/90 | HR 104 | Wt 155.0 lb

## 2014-11-30 DIAGNOSIS — F411 Generalized anxiety disorder: Secondary | ICD-10-CM

## 2014-11-30 DIAGNOSIS — K612 Anorectal abscess: Secondary | ICD-10-CM | POA: Diagnosis not present

## 2014-11-30 DIAGNOSIS — G43009 Migraine without aura, not intractable, without status migrainosus: Secondary | ICD-10-CM

## 2014-11-30 DIAGNOSIS — E538 Deficiency of other specified B group vitamins: Secondary | ICD-10-CM

## 2014-11-30 DIAGNOSIS — K50013 Crohn's disease of small intestine with fistula: Secondary | ICD-10-CM | POA: Diagnosis not present

## 2014-11-30 DIAGNOSIS — G8929 Other chronic pain: Secondary | ICD-10-CM

## 2014-11-30 DIAGNOSIS — M5440 Lumbago with sciatica, unspecified side: Secondary | ICD-10-CM

## 2014-11-30 MED ORDER — HYDROCODONE-ACETAMINOPHEN 10-325 MG PO TABS: 1.0000 | ORAL_TABLET | Freq: Four times a day (QID) | ORAL | Status: DC | PRN

## 2014-11-30 MED ORDER — KETOROLAC TROMETHAMINE 10 MG PO TABS
ORAL_TABLET | ORAL | Status: DC
Start: 2014-11-30 — End: 2015-03-02

## 2014-11-30 MED ORDER — MEPERIDINE HCL 50 MG PO TABS: 50.0000 mg | ORAL_TABLET | Freq: Every day | ORAL | Status: DC | PRN

## 2014-11-30 NOTE — Assessment & Plan Note (Signed)
On Rx: Norco; Toradol, Demerol prn Chronic and severe On disabiity  Potential benefits of a long term opioids use as well as potential risks (i.e. addiction risk, apnea etc) and complications (i.e. Somnolence, constipation and others) were explained to the patient and were aknowledged. 

## 2014-11-30 NOTE — Progress Notes (Signed)
Pre visit review using our clinic review tool, if applicable. No additional management support is needed unless otherwise documented below in the visit note. 

## 2014-11-30 NOTE — Assessment & Plan Note (Signed)
Chronic abd pain H/o recurrent fistula Demerol po rare for severe pain   Potential benefits of a long term opioids use as well as potential risks (i.e. addiction risk, apnea etc) and complications (i.e. Somnolence, constipation and others) were explained to the patient and were aknowledged.

## 2014-11-30 NOTE — Progress Notes (Signed)
Subjective:  Patient ID: [name] [name], female    DOB: Aug 14, 1970  Age: 44 y.o. MRN: [id number]  CC: No chief complaint on file.   HPI [name] [name] presents for LBP, abd pain due to Crohn's, anxiety f/u  Outpatient Prescriptions Prior to Visit  Medication Sig Dispense Refill  . ALPRAZolam (XANAX) 0.5 MG tablet Take 1 tablet (0.5 mg total) by mouth at bedtime as needed. 30 tablet 2  . benazepril-hydrochlorthiazide (LOTENSIN HCT) 20-12.5 MG per tablet TAKE ONE TABLET BY MOUTH ONCE EVERY DAY 30 tablet 11  . cyanocobalamin (,VITAMIN B-12,) 1000 MCG/ML injection Inject 1 mL (1,000 mcg total) into the skin every 14 (fourteen) days. 10 mL 3  . cyclobenzaprine (FLEXERIL) 10 MG tablet TAKE ONE HALF TO ONE TABLET BY MOUTH EVERY DAY AS NEEDED FOR MUSCLE SPASMS 30 tablet 1  . fluconazole (DIFLUCAN) 150 MG tablet Take 1 tablet (150 mg total) by mouth every 3 (three) days. 6 tablet 6  . lamoTRIgine (LAMICTAL) 100 MG tablet Take 100 mg by mouth daily.      . norethindrone (AYGESTIN) 5 MG tablet TAKE ONE TABLET BY MOUTH ONCE EVERY DAY 30 tablet 10  . nystatin-triamcinolone ointment (MYCOLOG) Apply topically 2 (two) times daily. 30 g 6  . promethazine (PHENERGAN) 25 MG tablet Take 1 tablet (25 mg total) by mouth 2 (two) times daily as needed for nausea. 60 tablet 5  . sertraline (ZOLOFT) 100 MG tablet Take 100 mg by mouth 2 (two) times daily.      . SYRINGE-NEEDLE, DISP, 3 ML (B-D 3CC LUER-LOK SYR 25GX5/8") 25G X 5/8" 3 ML MISC As directed 50 each 2  . HYDROcodone-acetaminophen (NORCO) 10-325 MG per tablet Take 1 tablet by mouth every 6 (six) hours as needed for severe pain. Please fill on or after 11/02/14 120 tablet 0  . ketorolac (TORADOL) 10 MG tablet TAKE 1 TABLET BY MOUTH EVERY 6 HOURS AS NEEDED FOR PAIN 20 tablet 1  . meperidine (DEMEROL) 50 MG tablet Take 1 tablet (50 mg total) by mouth daily as needed. For sever pain 20 tablet 0   No facility-administered medications prior to visit.     ROS Review of Systems  Constitutional: Negative for chills, activity change, appetite change, fatigue and unexpected weight change.  HENT: Negative for congestion, mouth sores and sinus pressure.   Eyes: Negative for visual disturbance.  Respiratory: Negative for cough and chest tightness.   Gastrointestinal: Positive for abdominal pain and diarrhea. Negative for nausea and blood in stool.  Genitourinary: Negative for frequency, difficulty urinating and vaginal pain.  Musculoskeletal: Positive for back pain. Negative for gait problem.  Skin: Negative for pallor and rash.  Neurological: Negative for dizziness, tremors, weakness, numbness and headaches.  Psychiatric/Behavioral: Negative for confusion and sleep disturbance. The patient is not nervous/anxious.     Objective:  BP 120/90 mmHg  Pulse 104  Wt 155 lb (70.308 kg)  SpO2 97%  BP Readings from Last 3 Encounters:  11/30/14 120/90  09/09/14 112/70  08/30/14 110/64    Wt Readings from Last 3 Encounters:  11/30/14 155 lb (70.308 kg)  09/09/14 149 lb (67.586 kg)  08/30/14 149 lb (67.586 kg)    Physical Exam  Constitutional: She appears well-developed. No distress.  HENT:  Head: Normocephalic.  Right Ear: External ear normal.  Left Ear: External ear normal.  Nose: Nose normal.  Mouth/Throat: Oropharynx is clear and moist.  Eyes: Conjunctivae are normal. Pupils are equal, round, and reactive to light. Right  eye exhibits no discharge. Left eye exhibits no discharge.  Neck: Normal range of motion. Neck supple. No JVD present. No tracheal deviation present. No thyromegaly present.  Cardiovascular: Normal rate, regular rhythm and normal heart sounds.   Pulmonary/Chest: No stridor. No respiratory distress. She has no wheezes.  Abdominal: Soft. Bowel sounds are normal. She exhibits no distension and no mass. There is no tenderness. There is no rebound and no guarding.  Musculoskeletal: She exhibits tenderness. She exhibits  no edema.  Lymphadenopathy:    She has no cervical adenopathy.  Neurological: She displays normal reflexes. No cranial nerve deficit. She exhibits normal muscle tone. Coordination normal.  Skin: No rash noted. No erythema.  Psychiatric: She has a normal mood and affect. Her behavior is normal. Judgment and thought content normal.    Lab Results  Component Value Date   WBC 10.0 08/30/2014   HGB 14.1 08/30/2014   HCT 40.9 08/30/2014   PLT 175.0 08/30/2014   GLUCOSE 90 08/30/2014   CHOL 168 08/30/2014   TRIG 99.0 08/30/2014   HDL 27.40* 08/30/2014   LDLDIRECT 171.9 05/01/2011   LDLCALC 121* 08/30/2014   ALT 10 08/30/2014   AST 11 08/30/2014   NA 138 08/30/2014   K 3.4* 08/30/2014   CL 103 08/30/2014   CREATININE 0.98 08/30/2014   BUN 12 08/30/2014   CO2 27 08/30/2014   TSH 1.83 08/30/2014    No results found.  Assessment & Plan:   Diagnoses and all orders for this visit:  Abscess of anal and rectal regions -     meperidine (DEMEROL) 50 MG tablet; Take 1 tablet (50 mg total) by mouth daily as needed. 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 Please fill on or after 12/02/14 -     ketorolac (TORADOL) 10 MG tablet; TAKE 1 TABLET BY MOUTH EVERY 6 HOURS AS NEEDED FOR PAIN -     Discontinue: HYDROcodone-acetaminophen (NORCO) 10-325 MG tablet; Take 1 tablet by mouth every 6 (six) hours as needed for severe pain. Please fill on or after 01/02/15 -     HYDROcodone-acetaminophen (NORCO) 10-325 MG tablet; Take 1 tablet by mouth every 6 (six) hours as needed for severe pain. Please fill on or after 02/01/15   I have discontinued [name] [name]'s HYDROcodone-acetaminophen and  HYDROcodone-acetaminophen. I have also changed her HYDROcodone-acetaminophen. Additionally, I am having her maintain her sertraline, lamoTRIgine, ALPRAZolam, promethazine, fluconazole, nystatin-triamcinolone ointment, cyanocobalamin, SYRINGE-NEEDLE (DISP) 3 ML, benazepril-hydrochlorthiazide, cyclobenzaprine, norethindrone, ketorolac, and meperidine.  Meds ordered this encounter  Medications  . DISCONTD: lamoTRIgine (LAMICTAL) 200 MG tablet    Sig:   . DISCONTD: HYDROcodone-acetaminophen (NORCO) 10-325 MG tablet    Sig: Take 1 tablet by mouth every 6 (six) hours as needed for severe pain. Please fill on or after 12/02/14    Dispense:  120 tablet    Refill:  0  . ketorolac (TORADOL) 10 MG tablet    Sig: TAKE 1 TABLET BY MOUTH EVERY 6 HOURS AS NEEDED FOR PAIN    Dispense:  20 tablet    Refill:  1  . meperidine (DEMEROL) 50 MG tablet    Sig: Take 1 tablet (50 mg total) by mouth daily as needed. For sever  pain    Dispense:  20 tablet    Refill:  0  . DISCONTD: HYDROcodone-acetaminophen (NORCO) 10-325 MG tablet    Sig: Take 1 tablet by mouth every 6 (six) hours as needed for severe pain. Please fill on or after 01/02/15    Dispense:  120 tablet    Refill:  0  . HYDROcodone-acetaminophen (NORCO) 10-325 MG tablet    Sig: Take 1 tablet by mouth every 6 (six) hours as needed for severe pain. Please fill on or after 02/01/15    Dispense:  120 tablet    Refill:  0     Follow-up: Return in about 3 months (around 03/02/2015) for a follow-up visit.  [name], MD

## 2014-11-30 NOTE — Assessment & Plan Note (Signed)
No relapse 

## 2014-11-30 NOTE — Assessment & Plan Note (Signed)
On B12 

## 2014-11-30 NOTE — Assessment & Plan Note (Signed)
Chronic  Xanax prn  Potential benefits of a long term benzodiazepines  use as well as potential risks  and complications were explained to the patient and were aknowledged. 

## 2014-12-15 ENCOUNTER — Other Ambulatory Visit: Payer: Self-pay | Admitting: Internal Medicine

## 2014-12-15 NOTE — Telephone Encounter (Signed)
Please advise, thanks.

## 2015-01-05 ENCOUNTER — Encounter: Payer: Self-pay | Admitting: Internal Medicine

## 2015-01-11 ENCOUNTER — Encounter: Payer: Self-pay | Admitting: Internal Medicine

## 2015-01-12 ENCOUNTER — Other Ambulatory Visit: Payer: Self-pay | Admitting: *Deleted

## 2015-01-12 MED ORDER — PROMETHAZINE HCL 25 MG PO TABS: 25.0000 mg | ORAL_TABLET | Freq: Two times a day (BID) | ORAL | Status: DC | PRN

## 2015-01-12 MED ORDER — CYANOCOBALAMIN 1000 MCG/ML IJ SOLN: 1000.0000 ug | INTRAMUSCULAR | Status: DC

## 2015-01-22 DIAGNOSIS — N341 Nonspecific urethritis: Secondary | ICD-10-CM | POA: Diagnosis not present

## 2015-01-22 DIAGNOSIS — Z8719 Personal history of other diseases of the digestive system: Secondary | ICD-10-CM | POA: Diagnosis not present

## 2015-01-22 DIAGNOSIS — Z8659 Personal history of other mental and behavioral disorders: Secondary | ICD-10-CM | POA: Diagnosis not present

## 2015-01-31 ENCOUNTER — Other Ambulatory Visit: Payer: Self-pay | Admitting: Internal Medicine

## 2015-02-02 ENCOUNTER — Other Ambulatory Visit: Payer: Self-pay | Admitting: *Deleted

## 2015-02-02 ENCOUNTER — Telehealth: Payer: Self-pay | Admitting: Obstetrics and Gynecology

## 2015-02-02 NOTE — Telephone Encounter (Signed)
Ok Cipro 500 mg bid x 10 d Thx

## 2015-02-02 NOTE — Telephone Encounter (Signed)
Pt left vm c/o Crohn's flare. She is requesting Cipro 500 mg. Please advise.

## 2015-02-03 NOTE — Telephone Encounter (Signed)
Multiple unsuccessful attempts to contact pt to see what pharmacy to send Rx to. No answer/unable to leave vm.

## 2015-02-04 MED ORDER — CIPROFLOXACIN HCL 500 MG PO TABS: 500.0000 mg | ORAL_TABLET | Freq: Two times a day (BID) | ORAL | Status: DC

## 2015-02-04 NOTE — Telephone Encounter (Signed)
Pt called back to advise what pharmacy. Rx sent. See meds. Pt informed

## 2015-02-07 ENCOUNTER — Telehealth: Payer: Self-pay | Admitting: *Deleted

## 2015-02-07 NOTE — Telephone Encounter (Signed)
Both home and mobile phone numbers called x 2, and unable to get thru to pt. "not a valid number"

## 2015-03-02 ENCOUNTER — Ambulatory Visit (INDEPENDENT_AMBULATORY_CARE_PROVIDER_SITE_OTHER): Payer: 59 | Admitting: Internal Medicine

## 2015-03-02 ENCOUNTER — Encounter: Payer: Self-pay | Admitting: Internal Medicine

## 2015-03-02 ENCOUNTER — Other Ambulatory Visit (INDEPENDENT_AMBULATORY_CARE_PROVIDER_SITE_OTHER): Payer: 59

## 2015-03-02 VITALS — BP 90/52 | HR 90 | Wt 163.0 lb

## 2015-03-02 DIAGNOSIS — K5000 Crohn's disease of small intestine without complications: Secondary | ICD-10-CM

## 2015-03-02 DIAGNOSIS — I10 Essential (primary) hypertension: Secondary | ICD-10-CM | POA: Diagnosis not present

## 2015-03-02 DIAGNOSIS — E538 Deficiency of other specified B group vitamins: Secondary | ICD-10-CM

## 2015-03-02 DIAGNOSIS — K612 Anorectal abscess: Secondary | ICD-10-CM | POA: Diagnosis not present

## 2015-03-02 LAB — CBC WITH DIFFERENTIAL/PLATELET
Basophils Absolute: 0 10*3/uL (ref 0.0–0.1)
Basophils Relative: 0.5 % (ref 0.0–3.0)
Eosinophils Absolute: 0.1 10*3/uL (ref 0.0–0.7)
Eosinophils Relative: 0.5 % (ref 0.0–5.0)
HCT: 43.8 % (ref 36.0–46.0)
Hemoglobin: 14.7 g/dL (ref 12.0–15.0)
Lymphocytes Relative: 16.7 % (ref 12.0–46.0)
Lymphs Abs: 1.7 10*3/uL (ref 0.7–4.0)
MCHC: 33.6 g/dL (ref 30.0–36.0)
MCV: 88.7 fl (ref 78.0–100.0)
Monocytes Absolute: 0.5 10*3/uL (ref 0.1–1.0)
Monocytes Relative: 4.9 % (ref 3.0–12.0)
Neutro Abs: 8 10*3/uL — ABNORMAL HIGH (ref 1.4–7.7)
Neutrophils Relative %: 77.4 % — ABNORMAL HIGH (ref 43.0–77.0)
Platelets: 189 10*3/uL (ref 150.0–400.0)
RBC: 4.94 Mil/uL (ref 3.87–5.11)
RDW: 14.1 % (ref 11.5–15.5)
WBC: 10.3 10*3/uL (ref 4.0–10.5)

## 2015-03-02 LAB — BASIC METABOLIC PANEL
BUN: 19 mg/dL (ref 6–23)
CO2: 29 mEq/L (ref 19–32)
Calcium: 9.6 mg/dL (ref 8.4–10.5)
Chloride: 103 mEq/L (ref 96–112)
Creatinine, Ser: 1.1 mg/dL (ref 0.40–1.20)
GFR: 57.03 mL/min — ABNORMAL LOW (ref >60.00)
Glucose, Bld: 84 mg/dL (ref 70–99)
Potassium: 3.7 mEq/L (ref 3.5–5.1)
Sodium: 140 mEq/L (ref 135–145)

## 2015-03-02 LAB — HEPATIC FUNCTION PANEL
ALT: 6 U/L (ref 0–35)
AST: 8 U/L (ref 0–37)
Albumin: 4.4 g/dL (ref 3.5–5.2)
Alkaline Phosphatase: 72 U/L (ref 39–117)
Bilirubin, Direct: 0.1 mg/dL (ref 0.0–0.3)
Total Bilirubin: 0.4 mg/dL (ref 0.2–1.2)
Total Protein: 7 g/dL (ref 6.0–8.3)

## 2015-03-02 LAB — VITAMIN D 25 HYDROXY (VIT D DEFICIENCY, FRACTURES): VITD: 50.73 ng/mL (ref 30.00–100.00)

## 2015-03-02 LAB — VITAMIN B12: Vitamin B-12: 257 pg/mL (ref 211–911)

## 2015-03-02 MED ORDER — HYDROCODONE-ACETAMINOPHEN 10-325 MG PO TABS: 1.0000 | ORAL_TABLET | Freq: Four times a day (QID) | ORAL | Status: DC | PRN

## 2015-03-02 MED ORDER — KETOROLAC TROMETHAMINE 10 MG PO TABS: 10.0000 mg | ORAL_TABLET | Freq: Four times a day (QID) | ORAL | Status: DC | PRN

## 2015-03-02 MED ORDER — MEPERIDINE HCL 50 MG PO TABS: 50.0000 mg | ORAL_TABLET | Freq: Every day | ORAL | Status: DC | PRN

## 2015-03-02 NOTE — Progress Notes (Signed)
Subjective:  Patient ID: [name] [name], female    DOB: 1970/04/03  Age: 45 y.o. MRN: [id number]  CC: No chief complaint on file.   HPI [name] [name] presents for LBP, colitis, HAs. Pt took Cipro last month  Outpatient Prescriptions Prior to Visit  Medication Sig Dispense Refill  . ALPRAZolam (XANAX) 0.5 MG tablet Take 1 tablet (0.5 mg total) by mouth at bedtime as needed. 30 tablet 2  . benazepril-hydrochlorthiazide (LOTENSIN HCT) 20-12.5 MG per tablet TAKE ONE TABLET BY MOUTH ONCE EVERY DAY 30 tablet 11  . ciprofloxacin (CIPRO) 500 MG tablet Take 1 tablet (500 mg total) by mouth 2 (two) times daily. 20 tablet 0  . cyanocobalamin (,VITAMIN B-12,) 1000 MCG/ML injection Inject 1 mL (1,000 mcg total) into the skin every 14 (fourteen) days. 10 mL 3  . cyclobenzaprine (FLEXERIL) 10 MG tablet TAKE ONE HALF TO ONE TABLET BY MOUTH EVERY DAY AS NEEDED FOR MUSCLE SPASMS 30 tablet 1  . fluconazole (DIFLUCAN) 150 MG tablet Take 1 tablet (150 mg total) by mouth every 3 (three) days. 6 tablet 6  . norethindrone (AYGESTIN) 5 MG tablet TAKE ONE TABLET BY MOUTH ONCE EVERY DAY 30 tablet 10  . nystatin-triamcinolone ointment (MYCOLOG) Apply topically 2 (two) times daily. 30 g 6  . promethazine (PHENERGAN) 25 MG tablet Take 1 tablet (25 mg total) by mouth 2 (two) times daily as needed for nausea. 60 tablet 5  . sertraline (ZOLOFT) 100 MG tablet Take 100 mg by mouth 2 (two) times daily.      . SYRINGE-NEEDLE, DISP, 3 ML (B-D 3CC LUER-LOK SYR 25GX5/8") 25G X 5/8" 3 ML MISC As directed 50 each 2  . HYDROcodone-acetaminophen (NORCO) 10-325 MG tablet Take 1 tablet by mouth every 6 (six) hours as needed for severe pain. Please fill on or after 02/01/15 120 tablet 0  . ketorolac (TORADOL) 10 MG tablet TAKE 1 TABLET BY MOUTH EVERY 6 HOURS AS NEEDED FOR PAIN 20 tablet 1  . ketorolac (TORADOL) 10 MG tablet TAKE 1 TABLET BY MOUTH EVERY 6 HOURS AS NEEDED FOR PAIN 20 tablet 1  . lamoTRIgine (LAMICTAL) 100 MG tablet  Take 100 mg by mouth daily.      . meperidine (DEMEROL) 50 MG tablet Take 1 tablet (50 mg total) by mouth daily as needed. For sever pain 20 tablet 0   No facility-administered medications prior to visit.    ROS Review of Systems  Constitutional: Negative for chills, activity change, appetite change, fatigue and unexpected weight change.  HENT: Negative for congestion, mouth sores and sinus pressure.   Eyes: Negative for visual disturbance.  Respiratory: Negative for cough and chest tightness.   Gastrointestinal: Positive for abdominal pain. Negative for nausea.  Genitourinary: Negative for frequency, difficulty urinating and vaginal pain.  Musculoskeletal: Positive for back pain. Negative for gait problem.  Skin: Negative for pallor and rash.  Neurological: Negative for dizziness, tremors, weakness, numbness and headaches.  Psychiatric/Behavioral: Negative for suicidal ideas, confusion and sleep disturbance. The patient is nervous/anxious.     Objective:  BP 90/52 mmHg  Pulse 109  Wt 163 lb (73.936 kg)  SpO2 97%  BP Readings from Last 3 Encounters:  03/02/15 90/52  11/30/14 120/90  09/09/14 112/70    Wt Readings from Last 3 Encounters:  03/02/15 163 lb (73.936 kg)  11/30/14 155 lb (70.308 kg)  09/09/14 149 lb (67.586 kg)    Physical Exam  Constitutional: She appears well-developed. No distress.  HENT:  Head:  Normocephalic.  Right Ear: External ear normal.  Left Ear: External ear normal.  Nose: Nose normal.  Mouth/Throat: Oropharynx is clear and moist.  Eyes: Conjunctivae are normal. Pupils are equal, round, and reactive to light. Right eye exhibits no discharge. Left eye exhibits no discharge.  Neck: Normal range of motion. Neck supple. No JVD present. No tracheal deviation present. No thyromegaly present.  Cardiovascular: Normal rate, regular rhythm and normal heart sounds.   Pulmonary/Chest: No stridor. No respiratory distress. She has no wheezes.  Abdominal:  Soft. Bowel sounds are normal. She exhibits no distension and no mass. There is no tenderness. There is no rebound and no guarding.  Musculoskeletal: She exhibits tenderness. She exhibits no edema.  Lymphadenopathy:    She has no cervical adenopathy.  Neurological: She displays normal reflexes. No cranial nerve deficit. She exhibits normal muscle tone. Coordination normal.  Skin: No rash noted. No erythema.  Psychiatric: She has a normal mood and affect. Her behavior is normal. Judgment and thought content normal.  LS tender A little tachycardic Lab Results  Component Value Date   WBC 10.0 08/30/2014   HGB 14.1 08/30/2014   HCT 40.9 08/30/2014   PLT 175.0 08/30/2014   GLUCOSE 90 08/30/2014   CHOL 168 08/30/2014   TRIG 99.0 08/30/2014   HDL 27.40* 08/30/2014   LDLDIRECT 171.9 05/01/2011   LDLCALC 121* 08/30/2014   ALT 10 08/30/2014   AST 11 08/30/2014   NA 138 08/30/2014   K 3.4* 08/30/2014   CL 103 08/30/2014   CREATININE 0.98 08/30/2014   BUN 12 08/30/2014   CO2 27 08/30/2014   TSH 1.83 08/30/2014    No results found.  Assessment & Plan:   Diagnoses and all orders for this visit:  Crohn's disease of small intestine without complication ([place]) -     Basic metabolic panel; Future -     CBC with Differential/Platelet; Future -     Hepatic function panel; Future -     Vitamin B12; Future -     VITAMIN D 25 Hydroxy (Vit-D Deficiency, Fractures); Future  Abscess of anal and rectal regions -     meperidine (DEMEROL) 50 MG tablet; Take 1 tablet (50 mg total) by mouth daily as needed. For sever pain -     Basic metabolic panel; Future -     CBC with Differential/Platelet; Future -     Hepatic function panel; Future -     Vitamin B12; Future -     VITAMIN D 25 Hydroxy (Vit-D Deficiency, Fractures); Future  Essential hypertension, benign -     Basic metabolic panel; Future -     CBC with Differential/Platelet; Future -     Hepatic function panel; Future -     Vitamin  B12; Future -     VITAMIN D 25 Hydroxy (Vit-D Deficiency, Fractures); Future  Other orders -     Discontinue: HYDROcodone-acetaminophen (NORCO) 10-325 MG tablet; Take 1 tablet by mouth every 6 (six) hours as needed for severe pain. Please fill on or after 03/04/15 -     ketorolac (TORADOL) 10 MG tablet; Take 1 tablet (10 mg total) by mouth every 6 (six) hours as needed. for pain -     Discontinue: HYDROcodone-acetaminophen (NORCO) 10-325 MG tablet; Take 1 tablet by mouth every 6 (six) hours as needed for severe pain. Please fill on or after 04/04/15 -     HYDROcodone-acetaminophen (NORCO) 10-325 MG tablet; Take 1 tablet by mouth every 6 (six) hours as  needed for severe pain. Please fill on or after 05/02/15   I have discontinued Ms. Poehler's ketorolac, HYDROcodone-acetaminophen, and HYDROcodone-acetaminophen. I have also changed her ketorolac and HYDROcodone-acetaminophen. Additionally, I am having her maintain her sertraline, ALPRAZolam, fluconazole, nystatin-triamcinolone ointment, SYRINGE-NEEDLE (DISP) 3 ML, benazepril-hydrochlorthiazide, cyclobenzaprine, norethindrone, promethazine, cyanocobalamin, ciprofloxacin, lamoTRIgine, and meperidine.  Meds ordered this encounter  Medications  . lamoTRIgine (LAMICTAL) 200 MG tablet    Sig: Take 1 tablet by mouth daily.  [name] DISCONTD: HYDROcodone-acetaminophen (NORCO) 10-325 MG tablet    Sig: Take 1 tablet by mouth every 6 (six) hours as needed for severe pain. Please fill on or after 03/04/15    Dispense:  120 tablet    Refill:  0  . ketorolac (TORADOL) 10 MG tablet    Sig: Take 1 tablet (10 mg total) by mouth every 6 (six) hours as needed. for pain    Dispense:  20 tablet    Refill:  1  . meperidine (DEMEROL) 50 MG tablet    Sig: Take 1 tablet (50 mg total) by mouth daily as needed. For sever pain    Dispense:  20 tablet    Refill:  0  . DISCONTD: HYDROcodone-acetaminophen (NORCO) 10-325 MG tablet    Sig: Take 1 tablet by mouth every 6 (six) hours  as needed for severe pain. Please fill on or after 04/04/15    Dispense:  120 tablet    Refill:  0  . HYDROcodone-acetaminophen (NORCO) 10-325 MG tablet    Sig: Take 1 tablet by mouth every 6 (six) hours as needed for severe pain. Please fill on or after 05/02/15    Dispense:  120 tablet    Refill:  0     Follow-up: Return in about 3 months (around 05/31/2015) for a follow-up visit.  [name], MD

## 2015-03-02 NOTE — Assessment & Plan Note (Signed)
Chronic abd pain H/o recurrent fistula Demerol po rare for severe pain   Potential benefits of a long term opioids use as well as potential risks (i.e. addiction risk, apnea etc) and complications (i.e. Somnolence, constipation and others) were explained to the patient and were aknowledged. 

## 2015-03-02 NOTE — Assessment & Plan Note (Signed)
  Lotensin HCT

## 2015-03-02 NOTE — Assessment & Plan Note (Signed)
B12 labs

## 2015-03-02 NOTE — Progress Notes (Signed)
Pre visit review using our clinic review tool, if applicable. No additional management support is needed unless otherwise documented below in the visit note. 

## 2015-03-07 DIAGNOSIS — F3181 Bipolar II disorder: Secondary | ICD-10-CM | POA: Diagnosis not present

## 2015-03-09 ENCOUNTER — Other Ambulatory Visit: Payer: Self-pay

## 2015-03-09 ENCOUNTER — Ambulatory Visit (INDEPENDENT_AMBULATORY_CARE_PROVIDER_SITE_OTHER): Payer: 59 | Admitting: Gastroenterology

## 2015-03-09 ENCOUNTER — Encounter: Payer: Self-pay | Admitting: Gastroenterology

## 2015-03-09 VITALS — BP 126/83 | HR 91 | Temp 98.3°F | Ht 62.0 in | Wt 164.8 lb

## 2015-03-09 DIAGNOSIS — K50013 Crohn's disease of small intestine with fistula: Secondary | ICD-10-CM | POA: Insufficient documentation

## 2015-03-09 MED ORDER — NA SULFATE-K SULFATE-MG SULF 17.5-3.13-1.6 GM/177ML PO SOLN: 1.0000 | ORAL | Status: DC

## 2015-03-09 NOTE — Assessment & Plan Note (Addendum)
SYMPTOMS NOT IDEALLY CONTROLLED. No exposure to tb. Had hep ab vaccine. REVIEWED LABS JAN 2017.  COLONOSCOPY/UPPER ENDOSCOPY W/ MAC DUE TO POLYPHARMACY/FAILED CONSCIOU SEDATION. DISCUSSED PROCEDURE, BENEFITS, & RISKS: < 1% chance of medication reaction, bleeding, perforation, or rupture of spleen/liver. STOP USING TORADOL STOP SMOKING Hep b sAb, sAg and TB assay. FOLLOW UP IN 4 MOS.   GREATER THAN 50% WAS SPENT IN COUNSELING & COORDINATION OF CARE WITH THE PATIENT: DISCUSSED DIFFERENTIAL DIAGNOSIS, PROCEDURE, BENEFITS, RISKS, AND MANAGEMENT OF CROHN'S DISEASE. TOTAL ENCOUNTER TIME: 66 MINS.

## 2015-03-09 NOTE — Progress Notes (Signed)
Subjective:    Patient ID: [name] [name], female    DOB: November 18, 1970, 45 y.o.   MRN: [id number]  [name], MD  HPI Concerns: CHANGE IN BOWEL HABITS. about about abscess.  NOT SEEN DOCTOR IN A LONG TIME. USED TO SEE [facility]. TAKING TORADOL. SMOKES 1/2 PACK/DAY. TIRED REMICADE: WORKED FOR 3 YRS. 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 TRIED HUMIRA FOR 4-6 MOS AND DIDN'T CONTROL. IMURAN: ON STEROIDS, ASACOL, AND STEROIDS. THINKS SHE HAS AB ABCESS. BM: EVERY AM AND IF FLARES 4-6 TIMES A DAY. 2 DAYS HAD DIFFICULTY TO PASS STOOL. SAW A LITTLE BLOOD-WIPES MAINLY AND IN THE BOWL. GET RELIEF FROM JACU[facility]I. LAST DIARRHEA: 2 WEEKS AGO. APPETITE: FINE. WEIGHT: UP SINCE LAST YEAR. HEARTBURN: DID HAVE IT-POSSIBLE BARRETT'S. LAST TIME SAW A GI DOC-2010. DR. Morton Stall TOLD HER SHE NEEDED A COLOSTOMY. RARE ABDOMINAL PAIN. NO EYE PAIN, SORES IN MOUTH, RASH ON LEGS, JOINT PAIN, OR BACK PAIN. R SHOULDER PAIN AND L ANKLE PAIN: CHRONIC.  PT DENIES FEVER, CHILLS, HEMATEMESIS, nausea, vomiting, melena, CHEST PAIN, SHORTNESS OF BREATH, problems swallowing, problems with sedation, OR heartburn or indigestion.  Past Medical History  Diagnosis Date  . LBP (low back pain)     Dr. Mina Marble  . GERD (gastroesophageal reflux disease)   . Anxiety   . Crohn's   . Bartholin cyst 2008    vag.  [name] Perianal abscess 2009  . Vitamin B12 deficiency   . Arthritis     Dr. Eddie Dibbles  . Elevated glucose 2010  . Kidney stone   . BIPOLAR AFFECTIVE DISORDER 04/14/2007  . Depression    Past Surgical History  Procedure Laterality Date  . Elbow surgery      left  . Rectovaginal fistula closure      did not help   Allergies  Allergen Reactions  . Cephalexin   . Codeine   . Guaifenesin   . Imitrex [Sumatriptan]     2 fingers got very hot  . Morphine   . Omeprazole     Pt states that she has never taken this medication!  . Sulfonamide Derivatives    Current Outpatient Prescriptions  Medication Sig  Dispense Refill  . ALPRAZolam (XANAX) 0.5 MG tablet Take 1 tablet (0.5 mg total) by mouth at bedtime as needed.    . benazepril-hydrochlorthiazide (LOTENSIN HCT) 20-12.5 MG per tablet TAKE ONE TABLET BY MOUTH ONCE EVERY DAY    . cyanocobalamin (,VITAMIN B-12,) 1000 MCG/ML injection Inject 1 mL (1,000 mcg total) into the skin every 14 (fourteen) days.    . cyclobenzaprine (FLEXERIL) 10 MG tablet TAKE ONE HALF TO ONE TABLET BY MOUTH EVERY DAY AS NEEDED FOR MUSCLE SPASMS    . HYDROcodone-acetaminophen (NORCO) 10-325 MG tablet Take 1 tablet by mouth every 6 (six) hours as needed for severe pain. Please fill on or after 05/02/15 UP TO 4TIMES A DAY, USU 3/DAY.   [name] ketorolac (TORADOL) 10 MG tablet Take 1 tablet (10 mg total) by mouth every 6 (six) hours as needed. for pain    . meperidine (DEMEROL) 50 MG tablet Take 1 tablet (50 mg total) by mouth daily as needed. For sever pain <1-2X/WEEK   . norethindrone (AYGESTIN) 5 MG tablet TAKE ONE TABLET BY MOUTH ONCE EVERY DAY    . nystatin-triamcinolone ointment (MYCOLOG) Apply topically 2 (two) times daily.    . promethazine (PHENERGAN) 25 MG tablet Take 1 tablet (25 mg total) by mouth 2 (two) times daily as needed  for nausea. < 1-2X/WEEK   . sertraline (ZOLOFT) 100 MG tablet Take 100 mg by mouth 2 (two) times daily.      . SYRINGE-NEEDLE, DISP, 3 ML (B-D 3CC LUER-LOK SYR 25GX5/8") 25G X 5/8" 3 ML MISC As directed    .      .      .       Family History  Problem Relation Age of Onset  . Diabetes Mother   . Heart disease Mother   . Heart attack Mother   . Cancer Maternal Aunt     ovarian  . Breast cancer Maternal Grandmother   . Early death Maternal Grandmother 53  . Cancer Maternal Grandmother     breast  . Heart attack Father   . Heart disease Father   . Cancer Maternal Grandfather     colon   Social History  Substance Use Topics  . Smoking status: Current Every Day Smoker -- 0.50 packs/day for 25 years    Types: Cigarettes  . Smokeless  tobacco: Never Used  . Alcohol Use: No   1 ADOPTED KID-NO NATURAL. SEPARATED: COUPLE OF YEARS WORKED AT [employer]. 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   Review of Systems PER HPI OTHERWISE ALL SYSTEMS ARE NEGATIVE.    Objective:   Physical Exam  Constitutional: She is oriented to person, place, and time. She appears well-developed and well-nourished. No distress.  HENT:  Head: Normocephalic and atraumatic.  Mouth/Throat: Oropharynx is clear and moist. No oropharyngeal exudate.  Eyes: Pupils are equal, round, and reactive to light. No scleral icterus.  Neck: Normal range of motion. Neck supple.  Cardiovascular: Normal rate, regular rhythm and normal heart sounds.   Pulmonary/Chest: Effort normal and breath sounds normal. No respiratory distress.  Abdominal: Soft. Bowel sounds are normal. She exhibits no distension. There is no tenderness.  Genitourinary: Rectal exam shows external hemorrhoid, tenderness (PAIN WITH INSERTION OF FINGER IN ANAL CANAL. PT HAS PROMINENCE IN  R LATERAL PERINEAL AREA., NONTNEDER EXTERNALLY.) and anal tone abnormal. Rectal exam shows no mass.  Musculoskeletal: She exhibits no edema.  Lymphadenopathy:    She has no cervical adenopathy.  Neurological: She is alert and oriented to person, place, and time.  NO FOCAL DEFICITS  Psychiatric: She has a normal mood and affect.  Vitals reviewed.     Assessment & Plan:

## 2015-03-09 NOTE — Patient Instructions (Addendum)
Complete labs.  COLONOSCOPY AND UPPER ENDOSCOPY WITH PROPOFOL.  AFTER ENDOSCOPY WE WILL DECIDE WHAT MEDS TO USE TO TREAT YOUR CROHN'S DISEASE.  DRINK WATER TO KEEP YOUR URINE LIGHT YELLOW.  CONTINUE CURRENT BOWEL REGIMEN.  FOLLOW UP IN 4 MOS.

## 2015-03-10 LAB — HEPATITIS B SURFACE ANTIBODY,QUALITATIVE: Hep B S Ab: POSITIVE — AB

## 2015-03-10 LAB — HEPATITIS B SURFACE ANTIGEN: Hepatitis B Surface Ag: NEGATIVE

## 2015-03-11 LAB — QUANTIFERON TB GOLD ASSAY (BLOOD)
Interferon Gamma Release Assay: NEGATIVE
Mitogen value: >10 IU/mL
Quantiferon Nil Value: 0.03 IU/mL
Quantiferon Tb Ag Minus Nil Value: 0.01 IU/mL
TB Ag value: 0.04 IU/mL

## 2015-03-22 NOTE — Telephone Encounter (Signed)
Pt seen Dr. Oneida Alar.

## 2015-03-23 ENCOUNTER — Inpatient Hospital Stay (HOSPITAL_COMMUNITY): Admission: RE | Admit: 2015-03-23 | Payer: 59 | Source: Ambulatory Visit

## 2015-03-24 ENCOUNTER — Telehealth: Payer: Self-pay | Admitting: Gastroenterology

## 2015-03-24 NOTE — Telephone Encounter (Signed)
PLEASE CALL PT. HER TB TEST IS NEGATIVE. SHE HAD AN IMMUNE RESPONSE TO THE HEP B VACCINE. WILL SEE HER FOR HER TCS FEB 2017.

## 2015-03-24 NOTE — Patient Instructions (Signed)
[name] [name]  03/24/2015     @PREFPERIOPPHARMACY @   Your procedure is scheduled on 03/29/15  [facility] to [facility]  Call this number if you have problems the morning of surgery:  [phone]   Remember:  Do not eat food or drink liquids after midnight.  Take these medicines the morning of surgery with A SIP OF WATER xanax, zoloft, norco, flexeril, lotensin.   Do not wear jewelry, make-up or nail polish.  Do not wear lotions, powders, or perfumes.  You may wear deodorant.  Do not shave 48 hours prior to surgery.  Men may shave face and neck.  Do not bring valuables to the hospital.  [facility] is not responsible for any belongings or valuables.  Contacts, dentures or bridgework may not be worn into surgery.  Leave your suitcase in the car.  After surgery it may be brought to your room.  For patients admitted to the hospital, discharge time will be determined by your treatment team.  Patients discharged the day of surgery will not be allowed to drive home.   Name and phone number of your driver:  family Special instructions:    Please read over the following fact sheets that you were given. Anesthesia Post-op Instructions and Care and Recovery After Surgery  Esophagogastroduodenoscopy Esophagogastroduodenoscopy (EGD) is a procedure that is used to examine the lining of the esophagus, stomach, and first part of the small intestine (duodenum). A long, flexible, lighted tube with a camera attached (endoscope) is inserted down the throat to view these organs. This procedure is done to detect problems or abnormalities, such as inflammation, bleeding, ulcers, or growths, in order to treat them. The procedure lasts 5-20 minutes. It is usually an outpatient procedure, but it may need to be performed in a hospital in emergency cases. LET [facility] CARE PROVIDER KNOW ABOUT:  Any allergies you have.  All medicines you are taking, including vitamins, herbs, eye drops,  creams, and over-the-counter medicines.  Previous problems you or members of your family have had with the use of anesthetics.  Any blood disorders you have.  Previous surgeries you have had.  Medical conditions you have. RISKS AND COMPLICATIONS Generally, this is a safe procedure. However, problems can occur and include:  Infection.  Bleeding.  Tearing (perforation) of the esophagus, stomach, or duodenum.  Difficulty breathing or not being able to breathe.  Excessive sweating.  Spasms of the larynx.  Slowed heartbeat.  Low blood pressure. BEFORE THE PROCEDURE  Do not eat or drink anything after midnight on the night before the procedure or as directed by your health care provider.  Do not take your regular medicines before the procedure if your health care provider asks you not to. Ask your health care provider about changing or stopping those medicines.  If you wear dentures, be prepared to remove them before the procedure.  Arrange for someone to drive you home after the procedure. PROCEDURE  A numbing medicine (local anesthetic) may be sprayed in your throat for comfort and to stop you from gagging or coughing.  You will have an IV tube inserted in a vein in your hand or arm. You will receive medicines and fluids through this tube.  You will be given a medicine to relax you (sedative).  A pain reliever will be given through the IV tube.  A mouth guard may be placed in your mouth to protect your teeth and to keep you from biting on the  endoscope.  You will be asked to lie on your left side.  The endoscope will be inserted down your throat and into your esophagus, stomach, and duodenum.  Air will be put through the endoscope to allow your health care provider to clearly view the lining of your esophagus.  The lining of your esophagus, stomach, and duodenum will be examined. During the exam, your health care provider may:  Remove tissue to be examined under a  microscope (biopsy) for inflammation, infection, or other medical problems.  Remove growths.  Remove objects (foreign bodies) that are stuck.  Treat any bleeding with medicines or other devices that stop tissues from bleeding (hot cautery, clipping devices).  Widen (dilate) or stretch narrowed areas of your esophagus and stomach.  The endoscope will be withdrawn. AFTER THE PROCEDURE  You will be taken to a recovery area for observation. Your blood pressure, heart rate, breathing rate, and blood oxygen level will be monitored often until the medicines you were given have worn off.  Do not eat or drink anything until the numbing medicine has worn off and your gag reflex has returned. You may choke.  Your health care provider should be able to discuss his or her findings with you. It will take longer to discuss the test results if any biopsies were taken.   This information is not intended to replace advice given to you by your health care provider. Make sure you discuss any questions you have with your health care provider.   Document Released: 06/08/2004 Document Revised: 02/26/2014 Document Reviewed: 01/09/2012 Elsevier Interactive Patient Education 2016 [employer]. Colonoscopy A colonoscopy is an exam to look at the entire large intestine (colon). This exam can help find problems such as tumors, polyps, inflammation, and areas of bleeding. The exam takes about 1 hour.  LET [facility] CARE PROVIDER KNOW ABOUT:   Any allergies you have.  All medicines you are taking, including vitamins, herbs, eye drops, creams, and over-the-counter medicines.  Previous problems you or members of your family have had with the use of anesthetics.  Any blood disorders you have.  Previous surgeries you have had.  Medical conditions you have. RISKS AND COMPLICATIONS  Generally, this is a safe procedure. However, as with any procedure, complications can occur. Possible complications  include:  Bleeding.  Tearing or rupture of the colon wall.  Reaction to medicines given during the exam.  Infection (rare). BEFORE THE PROCEDURE   Ask your health care provider about changing or stopping your regular medicines.  You may be prescribed an oral bowel prep. This involves drinking a large amount of medicated liquid, starting the day before your procedure. The liquid will cause you to have multiple loose stools until your stool is almost clear or light green. This cleans out your colon in preparation for the procedure.  Do not eat or drink anything else once you have started the bowel prep, unless your health care provider tells you it is safe to do so.  Arrange for someone to drive you home after the procedure. PROCEDURE   You will be given medicine to help you relax (sedative).  You will lie on your side with your knees bent.  A long, flexible tube with a light and camera on the end (colonoscope) will be inserted through the rectum and into the colon. The camera sends video back to a computer screen as it moves through the colon. The colonoscope also releases carbon dioxide gas to inflate the colon. This helps  your health care provider see the area better.  During the exam, your health care provider may take a small tissue sample (biopsy) to be examined under a microscope if any abnormalities are found.  The exam is finished when the entire colon has been viewed. AFTER THE PROCEDURE   Do not drive for 24 hours after the exam.  You may have a small amount of blood in your stool.  You may pass moderate amounts of gas and have mild abdominal cramping or bloating. This is caused by the gas used to inflate your colon during the exam.  Ask when your test results will be ready and how you will get your results. Make sure you get your test results.   This information is not intended to replace advice given to you by your health care provider. Make sure you discuss any  questions you have with your health care provider.   Document Released: 02/03/2000 Document Revised: 11/26/2012 Document Reviewed: 10/13/2012 Elsevier Interactive Patient Education 2016 [employer] PATIENT INSTRUCTIONS POST-ANESTHESIA  IMMEDIATELY FOLLOWING SURGERY:  Do not drive or operate machinery for the first twenty four hours after surgery.  Do not make any important decisions for twenty four hours after surgery or while taking narcotic pain medications or sedatives.  If you develop intractable nausea and vomiting or a severe headache please notify your doctor immediately.  FOLLOW-UP:  Please make an appointment with your surgeon as instructed. You do not need to follow up with anesthesia unless specifically instructed to do so.  WOUND CARE INSTRUCTIONS (if applicable):  Keep a dry clean dressing on the anesthesia/puncture wound site if there is drainage.  Once the wound has quit draining you may leave it open to air.  Generally you should leave the bandage intact for twenty four hours unless there is drainage.  If the epidural site drains for more than 36-48 hours please call the anesthesia department.  QUESTIONS?:  Please feel free to call your physician or the hospital operator if you have any questions, and they will be happy to assist you.

## 2015-03-25 ENCOUNTER — Encounter (HOSPITAL_COMMUNITY): Payer: Self-pay

## 2015-03-25 ENCOUNTER — Encounter (HOSPITAL_COMMUNITY)
Admission: RE | Admit: 2015-03-25 | Discharge: 2015-03-25 | Disposition: A | Discharge status: Home / Self Care | Payer: 59 | Source: Ambulatory Visit | Attending: Gastroenterology | Admitting: Gastroenterology

## 2015-03-25 ENCOUNTER — Other Ambulatory Visit: Payer: Self-pay

## 2015-03-25 DIAGNOSIS — Z01810 Encounter for preprocedural cardiovascular examination: Secondary | ICD-10-CM | POA: Insufficient documentation

## 2015-03-25 DIAGNOSIS — K625 Hemorrhage of anus and rectum: Secondary | ICD-10-CM | POA: Diagnosis not present

## 2015-03-25 DIAGNOSIS — Z01812 Encounter for preprocedural laboratory examination: Secondary | ICD-10-CM | POA: Diagnosis not present

## 2015-03-25 DIAGNOSIS — R9431 Abnormal electrocardiogram [ECG] [EKG]: Secondary | ICD-10-CM | POA: Diagnosis not present

## 2015-03-25 HISTORY — DX: Essential (primary) hypertension: I10

## 2015-03-25 LAB — CBC WITH DIFFERENTIAL/PLATELET
Basophils Absolute: 0 10*3/uL (ref 0.0–0.1)
Basophils Relative: 0 %
Eosinophils Absolute: 0.1 10*3/uL (ref 0.0–0.7)
Eosinophils Relative: 1 %
HCT: 43.7 % (ref 36.0–46.0)
Hemoglobin: 15.4 g/dL — ABNORMAL HIGH (ref 12.0–15.0)
Lymphocytes Relative: 17 %
Lymphs Abs: 1.9 10*3/uL (ref 0.7–4.0)
MCH: 31.2 pg (ref 26.0–34.0)
MCHC: 35.2 g/dL (ref 30.0–36.0)
MCV: 88.6 fL (ref 78.0–100.0)
Monocytes Absolute: 0.5 10*3/uL (ref 0.1–1.0)
Monocytes Relative: 4 %
Neutro Abs: 8.5 10*3/uL — ABNORMAL HIGH (ref 1.7–7.7)
Neutrophils Relative %: 78 %
Platelets: 170 10*3/uL (ref 150–400)
RBC: 4.93 MIL/uL (ref 3.87–5.11)
RDW: 13.8 % (ref 11.5–15.5)
WBC: 10.9 10*3/uL — ABNORMAL HIGH (ref 4.0–10.5)

## 2015-03-25 LAB — BASIC METABOLIC PANEL
Anion gap: 10 (ref 5–15)
BUN: 20 mg/dL (ref 6–20)
CO2: 23 mmol/L (ref 22–32)
Calcium: 9.3 mg/dL (ref 8.9–10.3)
Chloride: 104 mmol/L (ref 101–111)
Creatinine, Ser: 1.05 mg/dL — ABNORMAL HIGH (ref 0.44–1.00)
GFR calc Af Amer: >60 mL/min (ref >60)
GFR calc non Af Amer: >60 mL/min (ref >60)
Glucose, Bld: 148 mg/dL — ABNORMAL HIGH (ref 65–99)
Potassium: 3.7 mmol/L (ref 3.5–5.1)
Sodium: 137 mmol/L (ref 135–145)

## 2015-03-25 LAB — HCG, SERUM, QUALITATIVE: Preg, Serum: NEGATIVE

## 2015-03-25 NOTE — Telephone Encounter (Signed)
Pt is aware of results. 

## 2015-03-29 ENCOUNTER — Encounter (HOSPITAL_COMMUNITY)
Admission: RE | Disposition: A | Discharge status: Home / Self Care | Payer: Self-pay | Source: Ambulatory Visit | Attending: Gastroenterology

## 2015-03-29 ENCOUNTER — Ambulatory Visit (HOSPITAL_COMMUNITY): Payer: 59 | Admitting: Anesthesiology

## 2015-03-29 ENCOUNTER — Ambulatory Visit (HOSPITAL_COMMUNITY)
Admission: RE | Admit: 2015-03-29 | Discharge: 2015-03-29 | Disposition: A | Discharge status: Home / Self Care | Payer: 59 | Source: Ambulatory Visit | Attending: Gastroenterology | Admitting: Gastroenterology

## 2015-03-29 ENCOUNTER — Encounter (HOSPITAL_COMMUNITY): Payer: Self-pay | Admitting: *Deleted

## 2015-03-29 DIAGNOSIS — F17210 Nicotine dependence, cigarettes, uncomplicated: Secondary | ICD-10-CM | POA: Diagnosis not present

## 2015-03-29 DIAGNOSIS — Z79899 Other long term (current) drug therapy: Secondary | ICD-10-CM | POA: Insufficient documentation

## 2015-03-29 DIAGNOSIS — K2960 Other gastritis without bleeding: Secondary | ICD-10-CM | POA: Insufficient documentation

## 2015-03-29 DIAGNOSIS — K921 Melena: Secondary | ICD-10-CM | POA: Diagnosis not present

## 2015-03-29 DIAGNOSIS — K5090 Crohn's disease, unspecified, without complications: Secondary | ICD-10-CM | POA: Diagnosis not present

## 2015-03-29 DIAGNOSIS — M1990 Unspecified osteoarthritis, unspecified site: Secondary | ICD-10-CM | POA: Diagnosis not present

## 2015-03-29 DIAGNOSIS — K3189 Other diseases of stomach and duodenum: Secondary | ICD-10-CM | POA: Diagnosis not present

## 2015-03-29 DIAGNOSIS — K2990 Gastroduodenitis, unspecified, without bleeding: Secondary | ICD-10-CM | POA: Diagnosis not present

## 2015-03-29 DIAGNOSIS — K529 Noninfective gastroenteritis and colitis, unspecified: Secondary | ICD-10-CM | POA: Insufficient documentation

## 2015-03-29 DIAGNOSIS — K2270 Barrett's esophagus without dysplasia: Secondary | ICD-10-CM | POA: Diagnosis not present

## 2015-03-29 DIAGNOSIS — K633 Ulcer of intestine: Secondary | ICD-10-CM

## 2015-03-29 DIAGNOSIS — E538 Deficiency of other specified B group vitamins: Secondary | ICD-10-CM | POA: Diagnosis not present

## 2015-03-29 DIAGNOSIS — F419 Anxiety disorder, unspecified: Secondary | ICD-10-CM | POA: Diagnosis not present

## 2015-03-29 DIAGNOSIS — K2970 Gastritis, unspecified, without bleeding: Secondary | ICD-10-CM

## 2015-03-29 DIAGNOSIS — F329 Major depressive disorder, single episode, unspecified: Secondary | ICD-10-CM | POA: Insufficient documentation

## 2015-03-29 DIAGNOSIS — R1013 Epigastric pain: Secondary | ICD-10-CM | POA: Insufficient documentation

## 2015-03-29 DIAGNOSIS — K625 Hemorrhage of anus and rectum: Secondary | ICD-10-CM | POA: Diagnosis not present

## 2015-03-29 DIAGNOSIS — K219 Gastro-esophageal reflux disease without esophagitis: Secondary | ICD-10-CM | POA: Diagnosis not present

## 2015-03-29 DIAGNOSIS — K2980 Duodenitis without bleeding: Secondary | ICD-10-CM | POA: Diagnosis not present

## 2015-03-29 DIAGNOSIS — K5150 Left sided colitis without complications: Secondary | ICD-10-CM | POA: Diagnosis not present

## 2015-03-29 DIAGNOSIS — K319 Disease of stomach and duodenum, unspecified: Secondary | ICD-10-CM | POA: Diagnosis not present

## 2015-03-29 HISTORY — PX: ESOPHAGOGASTRODUODENOSCOPY (EGD) WITH PROPOFOL: SHX5813

## 2015-03-29 HISTORY — PX: COLONOSCOPY WITH PROPOFOL: SHX5780

## 2015-03-29 SURGERY — COLONOSCOPY WITH PROPOFOL
Anesthesia: Monitor Anesthesia Care

## 2015-03-29 MED ORDER — MIDAZOLAM HCL 2 MG/2ML IJ SOLN
INTRAMUSCULAR | Status: AC
  Filled 2015-03-29: qty 2

## 2015-03-29 MED ORDER — PROPOFOL 10 MG/ML IV BOLUS
INTRAVENOUS | Status: AC
  Filled 2015-03-29: qty 40

## 2015-03-29 MED ORDER — ONDANSETRON HCL 4 MG/2ML IJ SOLN: 4.0000 mg | Freq: Once | INTRAMUSCULAR | Status: DC | PRN

## 2015-03-29 MED ORDER — FENTANYL CITRATE (PF) 100 MCG/2ML IJ SOLN
25.0000 ug | INTRAMUSCULAR | Status: AC
  Administered 2015-03-29: 25 ug via INTRAVENOUS

## 2015-03-29 MED ORDER — LIDOCAINE HCL (PF) 1 % IJ SOLN
INTRAMUSCULAR | Status: AC
  Filled 2015-03-29: qty 5

## 2015-03-29 MED ORDER — MIDAZOLAM HCL 2 MG/2ML IJ SOLN
1.0000 mg | INTRAMUSCULAR | Status: DC | PRN
  Administered 2015-03-29: 2 mg via INTRAVENOUS

## 2015-03-29 MED ORDER — PROPOFOL 10 MG/ML IV BOLUS
INTRAVENOUS | Status: DC | PRN
  Administered 2015-03-29 (×4): 10 mg via INTRAVENOUS

## 2015-03-29 MED ORDER — MIDAZOLAM HCL 5 MG/5ML IJ SOLN
INTRAMUSCULAR | Status: DC | PRN
  Administered 2015-03-29: 2 mg via INTRAVENOUS

## 2015-03-29 MED ORDER — LACTATED RINGERS IV SOLN
INTRAVENOUS | Status: DC
  Administered 2015-03-29 (×2): via INTRAVENOUS

## 2015-03-29 MED ORDER — LIDOCAINE VISCOUS 2 % MT SOLN
OROMUCOSAL | Status: AC
  Filled 2015-03-29: qty 15

## 2015-03-29 MED ORDER — FENTANYL CITRATE (PF) 100 MCG/2ML IJ SOLN
INTRAMUSCULAR | Status: AC
  Filled 2015-03-29: qty 2

## 2015-03-29 MED ORDER — FENTANYL CITRATE (PF) 100 MCG/2ML IJ SOLN: 25.0000 ug | INTRAMUSCULAR | Status: DC | PRN

## 2015-03-29 MED ORDER — ONDANSETRON HCL 4 MG/2ML IJ SOLN
INTRAMUSCULAR | Status: AC
  Filled 2015-03-29: qty 2

## 2015-03-29 MED ORDER — OMEPRAZOLE 20 MG PO CPDR: DELAYED_RELEASE_CAPSULE | ORAL | Status: DC

## 2015-03-29 MED ORDER — PROPOFOL 500 MG/50ML IV EMUL
INTRAVENOUS | Status: DC | PRN
  Administered 2015-03-29: 125 ug/kg/min via INTRAVENOUS
  Administered 2015-03-29: 08:00:00 via INTRAVENOUS

## 2015-03-29 MED ORDER — ONDANSETRON HCL 4 MG/2ML IJ SOLN
4.0000 mg | Freq: Once | INTRAMUSCULAR | Status: AC
  Administered 2015-03-29: 4 mg via INTRAVENOUS

## 2015-03-29 NOTE — Anesthesia Preprocedure Evaluation (Signed)
Anesthesia Evaluation  Patient identified by MRN, date of birth, ID band Patient awake    Reviewed: Allergy & Precautions, NPO status , Patient's Chart, lab work & pertinent test results  Airway Mallampati: II  TM Distance: >3 FB     Dental  (+) Edentulous Upper, Edentulous Lower   Pulmonary Current Smoker,    breath sounds clear to auscultation       Cardiovascular hypertension, Pt. on medications  Rhythm:Regular Rate:Normal     Neuro/Psych  Headaches, PSYCHIATRIC DISORDERS Anxiety Depression Bipolar Disorder    GI/Hepatic hiatal hernia, PUD, GERD  ,  Endo/Other  Hypothyroidism   Renal/GU      Musculoskeletal   Abdominal   Peds  Hematology   Anesthesia Other Findings   Reproductive/Obstetrics                             Anesthesia Physical Anesthesia Plan  ASA: III  Anesthesia Plan: MAC   Post-op Pain Management:    Induction: Intravenous  Airway Management Planned: Simple Face Mask  Additional Equipment:   Intra-op Plan:   Post-operative Plan:   Informed Consent: I have reviewed the patients History and Physical, chart, labs and discussed the procedure including the risks, benefits and alternatives for the proposed anesthesia with the patient or authorized representative who has indicated his/her understanding and acceptance.     Plan Discussed with:   Anesthesia Plan Comments:         Anesthesia Quick Evaluation

## 2015-03-29 NOTE — Op Note (Addendum)
[facility] [address] [place], [address]   COLONOSCOPY PROCEDURE REPORT  PATIENT: [name], [name]  MR#: [id number] BIRTHDATE: 30-Jul-1970 , 30  yrs. old GENDER: female ENDOSCOPIST: Danie Binder, MD REFERRED KH:[id number] Avel Sensor, M.D. PROCEDURE DATE:  04-Apr-2015 PROCEDURE:   Colonoscopy with biopsy INDICATIONS:hematochezia.  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 LAST CT SCAN [facility] SEP 2011-Submucosal fatty infiltration of thedistal ileum and proximal ascending colon, suggestive of areas ofinactive inflammatory disease. The remaining small bowel isunremarkable. Increased caliber of the appendix without surroundinginflammatory changes to suggest acute appendicitis. Diffuseperirectal stranding. MEDICATIONS: MAC  DESCRIPTION OF PROCEDURE:    Physical exam was performed.  Informed consent was obtained from the patient after explaining the benefits, risks, and alternatives to procedure.  The patient was connected to monitor and placed in left lateral position. Continuous oxygen was provided by nasal cannula and IV medicine administered through an indwelling cannula.  After administration of sedation and rectal exam, the patients rectum was intubated and the EC-[id number] ([id number])  colonoscope was advanced under direct visualization to the cecum.  The scope was removed slowly by carefully examining the color, texture, anatomy, and integrity mucosa on the way out.  The patient was recovered in endoscopy and discharged home in satisfactory condition. Estimated blood loss is zero unless otherwise noted in this procedure report.    COLON FINDINGS: SEVERE ERYTHEMA/EDUMA/EXUDATE AND ULCERATION IN THE CECUM AND INVOLVING THE IC VALVE.  UNABLE TO INTUBATE THE ILEUM. COLD FORCEPS BIOPSIES OBTAINED IN THE CECUM.  BIOPSIES EVERY 10MM WHEN WITHDRAWING THE SCOPE FROM 70 CM TO 10 CM.  LIMITED EXAM OF THE ANAL CANAL.  PREP QUALITY:  fair.    CECAL W/D TIME: 22       minutes COMPLICATIONS: None  ENDOSCOPIC IMPRESSION: SEVERE PROCTOCOLITIS LIMITED TO CECUM AND RECTUM. LIMITED EXAM OF THE COLON MUCOSA AND ANAL CANAL  RECOMMENDATIONS: FOLLOW A LOW FAT/HIGH FIBER DIET. AVOID ITEMS THAT TRIGGER GASTRITIS. START OMEPRAZOLE 30 MINS PRIOR TO MEALS TWICE DAILY. AWAIT BIOPSY RESULTS.  PT FAILED IMURAN, [place]. CONISDER ENTYVIO. FOLLOW UP IN 3 MOS.      _______________________________ Lorrin MaisDanie Binder, MD 2015/04/04 8:41 PM Revised: April 04, 2015 8:41 PM   CPT CODES: ICD CODES:  The ICD and CPT codes recommended by this software are interpretations from the data that the clinical staff has captured with the software.  The verification of the translation of this report to the ICD and CPT codes and modifiers is the sole responsibility of the health care institution and practicing physician where this report was generated.  [employer] will not be held responsible for the validity of the ICD and CPT codes included on this report.  AMA assumes no liability for data contained or not contained herein. CPT is a Designer, television/film set of the [employer].

## 2015-03-29 NOTE — Discharge Instructions (Signed)
YOU HAVE ACTIVE CROHN'S IN THE RIGHT COLON AND YOUR RECTUM. YOUR PREP WAS NOT GREAT. I BIOPSIED YOUR COLON EVERY 10 CM. YOU HAVE A HIATAL HERNIA AND EROSIVE GASTRITIS/DUODENITIS. YOU DO NOT HAVE BARRETTS. You have DIVERTICULOSIS IN YOUR LEFT COLON.  I BIOPSIED YOUR STOMACH AND SMALL BOWEL.    FOLLOW A LOW FAT/HIGH FIBER DIET. AVOID ITEMS THAT CAUSE BLOATING. SEE INFO BELOW.  AVOID ITEMS THAT TRIGGER GASTRITIS. SEE INFO BELOW  START OMEPRAZOLE.  TAKE 30 MINUTES PRIOR TO YOUR MEALS TWICE DAILY.  YOUR BIOPSY RESULTS WILL BE AVAILABLE IN MY CHART  Feb 10 and MY OFFICE WILL CONTACT YOU IN 10-14 DAYS WITH YOUR RESULTS.    FOLLOW UP IN 3 MOS. On May 8 at [facility] with Neil Crouch PA   ENDOSCOPY Care After Read the instructions outlined below and refer to this sheet in the next week. These discharge instructions provide you with general information on caring for yourself after you leave the hospital. While your treatment has been planned according to the most current medical practices available, unavoidable complications occasionally occur. If you have any problems or questions after discharge, call DR. FIELDS, [phone].  ACTIVITY  You may resume your regular activity, but move at a slower pace for the next 24 hours.   Take frequent rest periods for the next 24 hours.   Walking will help get rid of the air and reduce the bloated feeling in your belly (abdomen).   No driving for 24 hours (because of the medicine (anesthesia) used during the test).   You may shower.   Do not sign any important legal documents or operate any machinery for 24 hours (because of the anesthesia used during the test).    NUTRITION  Drink plenty of fluids.   You may resume your normal diet as instructed by your doctor.   Begin with a light meal and progress to your normal diet. Heavy or fried foods are harder to digest and may make you feel sick to your stomach (nauseated).   Avoid alcoholic beverages for 24  hours or as instructed.    MEDICATIONS  You may resume your normal medications.   WHAT YOU CAN EXPECT TODAY  Some feelings of bloating in the abdomen.   Passage of more gas than usual.   Spotting of blood in your stool or on the toilet paper  .  IF YOU HAD POLYPS REMOVED DURING THE ENDOSCOPY:  Eat a soft diet IF YOU HAVE NAUSEA, BLOATING, ABDOMINAL PAIN, OR VOMITING.    FINDING OUT THE RESULTS OF YOUR TEST Not all test results are available during your visit. DR. Oneida Alar WILL CALL YOU WITHIN 14 DAYS OF YOUR PROCEDUE WITH YOUR RESULTS. Do not assume everything is normal if you have not heard from DR. FIELDS, CALL HER OFFICE AT [phone].  SEEK IMMEDIATE MEDICAL ATTENTION AND CALL THE OFFICE: [phone] IF:  You have more than a spotting of blood in your stool.   Your belly is swollen (abdominal distention).   You are nauseated or vomiting.   You have a temperature over 101F.   You have abdominal pain or discomfort that is severe or gets worse throughout the day.   Gastritis  Gastritis is an inflammation (the body's way of reacting to injury and/or infection) of the stomach. It is often caused by bacterial (germ) infections. It can also be caused BY ASPIRIN, BC/GOODY POWDER'S, (IBUPROFEN) MOTRIN, OR ALEVE (NAPROXEN), chemicals (including alcohol), SPICY FOODS, and medications. This illness may be associated  with generalized malaise (feeling tired, not well), UPPER ABDOMINAL STOMACH cramps, and fever. One common bacterial cause of gastritis is an organism known as H. Pylori. This can be treated with antibiotics.    High-Fiber Diet A high-fiber diet changes your normal diet to include more whole grains, legumes, fruits, and vegetables. Changes in the diet involve replacing refined carbohydrates with unrefined foods. The calorie level of the diet is essentially unchanged. The Dietary Reference Intake (recommended amount) for adult males is 38 grams per day. For adult  females, it is 25 grams per day. Pregnant and lactating women should consume 28 grams of fiber per day. Fiber is the intact part of a plant that is not broken down during digestion. Functional fiber is fiber that has been isolated from the plant to provide a beneficial effect in the body. PURPOSE  Increase stool bulk.   Ease and regulate bowel movements.  Lower cholesterol.  REDUCE RISK OF COLON CANCER  INDICATIONS THAT YOU NEED MORE FIBER  Constipation and hemorrhoids.   Uncomplicated diverticulosis (intestine condition) and irritable bowel syndrome.   Weight management.   As a protective measure against hardening of the arteries (atherosclerosis), diabetes, and cancer.   DO NOT USE WITH:  Acute diverticulitis (intestine infection).   Partial small bowel obstructions.   Complicated diverticular disease involving bleeding, rupture (perforation), or abscess (boil, furuncle).   Presence of autonomic neuropathy (nerve damage) or gastroparesis (stomach cannot empty itself).   GUIDELINES FOR INCREASING FIBER IN THE DIET  Start adding fiber to the diet slowly. A gradual increase of about 5 more grams (2 slices of whole-wheat bread, 2 servings of most fruits or vegetables, or 1 bowl of high-fiber cereal) per day is best. Too rapid an increase in fiber may result in constipation, flatulence, and bloating.   Drink enough water and fluids to keep your urine clear or pale yellow. Water, juice, or caffeine-free drinks are recommended. Not drinking enough fluid may cause constipation.   Eat a variety of high-fiber foods rather than one type of fiber.   Try to increase your intake of fiber through using high-fiber foods rather than fiber pills or supplements that contain small amounts of fiber.   The goal is to change the types of food eaten. Do not supplement your present diet with high-fiber foods, but replace foods in your present diet.   INCLUDE A VARIETY OF FIBER SOURCES  Replace  refined and processed grains with whole grains, canned fruits with fresh fruits, and incorporate other fiber sources. White rice, white breads, and most bakery goods contain little or no fiber.   Brown whole-grain rice, buckwheat oats, and many fruits and vegetables are all good sources of fiber. These include: broccoli, Brussels sprouts, cabbage, cauliflower, beets, sweet potatoes, white potatoes (skin on), carrots, tomatoes, eggplant, squash, berries, fresh fruits, and dried fruits.   Cereals appear to be the richest source of fiber. Cereal fiber is found in whole grains and bran. Bran is the fiber-rich outer coat of cereal grain, which is largely removed in refining. In whole-grain cereals, the bran remains. In breakfast cereals, the largest amount of fiber is found in those with "bran" in their names. The fiber content is sometimes indicated on the label.   You may need to include additional fruits and vegetables each day.   In baking, for 1 cup white flour, you may use the following substitutions:   1 cup whole-wheat flour minus 2 tablespoons.   1/2 cup white flour plus 1/2 cup  whole-wheat flour.   Low-Fat Diet BREADS, CEREALS, PASTA, RICE, DRIED PEAS, AND BEANS These products are high in carbohydrates and most are low in fat. Therefore, they can be increased in the diet as substitutes for fatty foods. They too, however, contain calories and should not be eaten in excess. Cereals can be eaten for snacks as well as for breakfast.  Include foods that contain fiber (fruits, vegetables, whole grains, and legumes). Research shows that fiber may lower blood cholesterol levels, especially the water-soluble fiber found in fruits, vegetables, oat products, and legumes. FRUITS AND VEGETABLES It is good to eat fruits and vegetables. Besides being sources of fiber, both are rich in vitamins and some minerals. They help you get the daily allowances of these nutrients. Fruits and vegetables can be used for  snacks and desserts. MEATS Limit lean meat, chicken, [place], and fish to no more than 6 ounces per day. Beef, Pork, and Lamb Use lean cuts of beef, pork, and lamb. Lean cuts include:  Extra-lean ground beef.  Arm roast.  Sirloin tip.  Center-cut ham.  Round steak.  Loin chops.  Rump roast.  Tenderloin.  Trim all fat off the outside of meats before cooking. It is not necessary to severely decrease the intake of red meat, but lean choices should be made. Lean meat is rich in protein and contains a highly absorbable form of iron. Premenopausal women, in particular, should avoid reducing lean red meat because this could increase the risk for low red blood cells (iron-deficiency anemia). The organ meats, such as liver, sweetbreads, kidneys, and brain are very rich in cholesterol. They should be limited. Chicken and [place] These are good sources of protein. The fat of poultry can be reduced by removing the skin and underlying fat layers before cooking. Chicken and [place] can be substituted for lean red meat in the diet. Poultry should not be fried or covered with high-fat sauces. Fish and Shellfish Fish is a good source of protein. Shellfish contain cholesterol, but they usually are low in saturated fatty acids. The preparation of fish is important. Like chicken and [place], they should not be fried or covered with high-fat sauces. EGGS Egg whites contain no fat or cholesterol. They can be eaten often. Try 1 to 2 egg whites instead of whole eggs in recipes or use egg substitutes that do not contain yolk. MILK AND DAIRY PRODUCTS Use skim or 1% milk instead of 2% or whole milk. Decrease whole milk, natural, and processed cheeses. Use nonfat or low-fat (2%) cottage cheese or low-fat cheeses made from vegetable oils. Choose nonfat or low-fat (1 to 2%) yogurt. Experiment with evaporated skim milk in recipes that call for heavy cream. Substitute low-fat yogurt or low-fat cottage cheese for sour cream in  dips and salad dressings. Have at least 2 servings of low-fat dairy products, such as 2 glasses of skim (or 1%) milk each day to help get your daily calcium intake.  FATS AND OILS Reduce the total intake of fats, especially saturated fat. Butterfat, lard, and beef fats are high in saturated fat and cholesterol. These should be avoided as much as possible. Vegetable fats do not contain cholesterol, but certain vegetable fats, such as coconut oil, palm oil, and palm kernel oil are very high in saturated fats. These should be limited. These fats are often used in bakery goods, processed foods, popcorn, oils, and nondairy creamers. Vegetable shortenings and some peanut butters contain hydrogenated oils, which are also saturated fats. Read the labels on  these foods and check for saturated vegetable oils. Unsaturated vegetable oils and fats do not raise blood cholesterol. However, they should be limited because they are fats and are high in calories. Total fat should still be limited to 30% of your daily caloric intake. Desirable liquid vegetable oils are corn oil, cottonseed oil, olive oil, canola oil, safflower oil, soybean oil, and sunflower oil. Peanut oil is not as good, but small amounts are acceptable. Buy a heart-healthy tub margarine that has no partially hydrogenated oils in the ingredients. Mayonnaise and salad dressings often are made from unsaturated fats, but they should also be limited because of their high calorie and fat content. Seeds, nuts, peanut butter, olives, and avocados are high in fat, but the fat is mainly the unsaturated type. These foods should be limited mainly to avoid excess calories and fat. OTHER EATING TIPS Snacks  Most sweets should be limited as snacks. They tend to be rich in calories and fats, and their caloric content outweighs their nutritional value. Some good choices in snacks are graham crackers, melba toast, soda crackers, bagels (no egg), English muffins, fruits, and  vegetables. These snacks are preferable to snack crackers, [place] fries, and chips. Popcorn should be air-popped or cooked in small amounts of liquid vegetable oil. Desserts Eat fruit, low-fat yogurt, and fruit ices. AVOID pastries, cake, and cookies. Sherbet, angel food cake, gelatin dessert, frozen low-fat yogurt, or other frozen products that do not contain saturated fat (pure fruit juice bars, frozen ice pops) are also acceptable.  COOKING METHODS Choose those methods that use little or no fat. They include: Poaching.  Braising.  Steaming.  Grilling.  Baking.  Stir-frying.  Broiling.  Microwaving.  Foods can be cooked in a nonstick pan without added fat, or use a nonfat cooking spray in regular cookware. Limit fried foods and avoid frying in saturated fat. Add moisture to lean meats by using water, broth, cooking wines, and other nonfat or low-fat sauces along with the cooking methods mentioned above. Soups and stews should be chilled after cooking. The fat that forms on top after a few hours in the refrigerator should be skimmed off. When preparing meals, avoid using excess salt. Salt can contribute to raising blood pressure in some people. EATING AWAY FROM HOME Order entres, potatoes, and vegetables without sauces or butter. When meat exceeds the size of a deck of cards (3 to 4 ounces), the rest can be taken home for another meal. Choose vegetable or fruit salads and ask for low-calorie salad dressings to be served on the side. Use dressings sparingly. Limit high-fat toppings, such as bacon, crumbled eggs, cheese, sunflower seeds, and olives. Ask for heart-healthy tub margarine instead of butter.   PATIENT INSTRUCTIONS POST-ANESTHESIA  IMMEDIATELY FOLLOWING SURGERY:  Do not drive or operate machinery for the first twenty four hours after surgery.  Do not make any important decisions for twenty four hours after surgery or while taking narcotic pain medications or sedatives.  If you  develop intractable nausea and vomiting or a severe headache please notify your doctor immediately.  FOLLOW-UP:  Please make an appointment with your surgeon as instructed. You do not need to follow up with anesthesia unless specifically instructed to do so.  WOUND CARE INSTRUCTIONS (if applicable):  Keep a dry clean dressing on the anesthesia/puncture wound site if there is drainage.  Once the wound has quit draining you may leave it open to air.  Generally you should leave the bandage intact for twenty four hours  unless there is drainage.  If the epidural site drains for more than 36-48 hours please call the anesthesia department.  QUESTIONS?:  Please feel free to call your physician or the hospital operator if you have any questions, and they will be happy to assist you.

## 2015-03-29 NOTE — Transfer of Care (Signed)
Immediate Anesthesia Transfer of Care Note  Patient: [name] [name]  Procedure(s) Performed: Procedure(s) with comments: COLONOSCOPY WITH PROPOFOL (N/A) - 0730 ESOPHAGOGASTRODUODENOSCOPY (EGD) WITH PROPOFOL (N/A)  Patient Location: PACU  Anesthesia Type:MAC  Level of Consciousness: awake  Airway & Oxygen Therapy: Patient Spontanous Breathing and Patient connected to face mask oxygen  Post-op Assessment: Report given to RN  Post vital signs: Reviewed  Last Vitals:  Filed Vitals:   03/29/15 0715 03/29/15 0720  BP: 117/79 127/84  Temp:    Resp: 23 13    Complications: No apparent anesthesia complications

## 2015-03-29 NOTE — Anesthesia Postprocedure Evaluation (Signed)
Anesthesia Post Note  Patient: [name] [name]  Procedure(s) Performed: Procedure(s) (LRB): COLONOSCOPY WITH PROPOFOL (N/A) ESOPHAGOGASTRODUODENOSCOPY (EGD) WITH PROPOFOL (N/A)  Patient location during evaluation: PACU Anesthesia Type: MAC Level of consciousness: awake and alert Pain management: pain level controlled Vital Signs Assessment: post-procedure vital signs reviewed and stable Respiratory status: spontaneous breathing Cardiovascular status: blood pressure returned to baseline Postop Assessment: no signs of nausea or vomiting Anesthetic complications: no    Last Vitals:  Filed Vitals:   03/29/15 0900 03/29/15 0912  BP: 99/68   Pulse: 111 119  Temp:    Resp: 23 18    Last Pain: There were no vitals filed for this visit.               Iven Earnhart

## 2015-03-29 NOTE — Op Note (Signed)
[facility] [address] [place], [address]   ENDOSCOPY PROCEDURE REPORT  PATIENT: [name] [name], [name] [name]  MR#: [id number] BIRTHDATE: [birth year] , 55  yrs. old GENDER: female  ENDOSCOPIST: Danie Binder, MD REFERRED [id number] Avel Sensor, M.D.  PROCEDURE DATE: 04-06-15 PROCEDURE:   EGD w/ biopsy  INDICATIONS:dyspepsia.   screening for Barrett's. MEDICATIONS: Monitored anesthesia care TOPICAL ANESTHETIC:   Viscous Xylocaine ASA CLASS:  DESCRIPTION OF PROCEDURE:     Physical exam was performed.  Informed consent was obtained from the patient after explaining the benefits, risks, and alternatives to the procedure.  The patient was connected to the monitor and placed in the left lateral position.  Continuous oxygen was provided by nasal cannula and IV medicine administered through an indwelling cannula.  After administration of sedation, the patients esophagus was intubated and the [id number]  endoscope was advanced under direct visualization to the second portion of the duodenum.  The scope was removed slowly by carefully examining the color, texture, anatomy, and integrity of the mucosa on the way out.  The patient was recovered in endoscopy and discharged home in satisfactory condition.  Estimated blood loss is zero unless otherwise noted in this procedure report.    ESOPHAGUS: The mucosa of the esophagus appeared normal.   STOMACH: Moderate erosive gastritis (inflammation) was found in the gastric antrum.  Multiple biopsies were performed using cold forceps. DUODENUM: Moderate duodenal inflammation was found in the duodenal bulb.   COLD FORCEPS BIOPSIES OBTAINED. The duodenal mucosa showed no abnormalities in the 2nd part of the duodenum. COMPLICATIONS: There were no immediate complications.  ENDOSCOPIC IMPRESSION: 1.   EROSIVE GASTRITIS DUODENITIS  RECOMMENDATIONS: FOLLOW A LOW FAT/HIGH FIBER DIET. AVOID ITEMS THAT TRIGGER GASTRITIS. OMEPRAZOLE  30 MINsUTES PRIOR TO MEALS TWICE DAILY. AWAIT BIOPSY RESULTS. FOLLOW UP IN 3 MOS.  REPEAT EXAM:  eSigned:  Danie Binder, MD April 06, 2015 11:56 AM    CPT CODES: ICD CODES:  The ICD and CPT codes recommended by this software are interpretations from the data that the clinical staff has captured with the software.  The verification of the translation of this report to the ICD and CPT codes and modifiers is the sole responsibility of the health care institution and practicing physician where this report was generated.  [employer]. will not be held responsible for the validity of the ICD and CPT codes included on this report.  AMA assumes no liability for data contained or not contained herein. CPT is a Designer, television/film set of the [employer].

## 2015-03-29 NOTE — Interval H&P Note (Signed)
History and Physical Interval Note:  03/29/2015 7:09 AM  [name] [name]  has presented today for surgery, with the diagnosis of RECTAL BLEEDING/BARRETTS SCREENING  The various methods of treatment have been discussed with the patient and family. After consideration of risks, benefits and other options for treatment, the patient has consented to  Procedure(s) with comments: COLONOSCOPY WITH PROPOFOL (N/A) - 0730 ESOPHAGOGASTRODUODENOSCOPY (EGD) WITH PROPOFOL (N/A) as a surgical intervention .  The patient's history has been reviewed, patient examined, no change in status, stable for surgery.  I have reviewed the patient's chart and labs.  Questions were answered to the patient's satisfaction.     [employer]

## 2015-03-29 NOTE — H&P (View-Only) (Signed)
Subjective:    Patient ID: [name] [name], female    DOB: 22-Nov-1970, 45 y.o.   MRN: [id number]  [name], MD  HPI Concerns: CHANGE IN BOWEL HABITS. about about abscess.  NOT SEEN DOCTOR IN A LONG TIME. USED TO SEE [facility]. TAKING TORADOL. SMOKES 1/2 PACK/DAY. TIRED REMICADE: WORKED FOR 3 YRS. 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 TRIED HUMIRA FOR 4-6 MOS AND DIDN'T CONTROL. IMURAN: ON STEROIDS, ASACOL, AND STEROIDS. THINKS SHE HAS AB ABCESS. BM: EVERY AM AND IF FLARES 4-6 TIMES A DAY. 2 DAYS HAD DIFFICULTY TO PASS STOOL. SAW A LITTLE BLOOD-WIPES MAINLY AND IN THE BOWL. GET RELIEF FROM JACUZZI. LAST DIARRHEA: 2 WEEKS AGO. APPETITE: FINE. WEIGHT: UP SINCE LAST YEAR. HEARTBURN: DID HAVE IT-POSSIBLE BARRETT'S. LAST TIME SAW A GI DOC-2010. DR. Morton Stall TOLD HER SHE NEEDED A COLOSTOMY. RARE ABDOMINAL PAIN. NO EYE PAIN, SORES IN MOUTH, RASH ON LEGS, JOINT PAIN, OR BACK PAIN. R SHOULDER PAIN AND L ANKLE PAIN: CHRONIC.  PT DENIES FEVER, CHILLS, HEMATEMESIS, nausea, vomiting, melena, CHEST PAIN, SHORTNESS OF BREATH, problems swallowing, problems with sedation, OR heartburn or indigestion.  Past Medical History  Diagnosis Date  . LBP (low back pain)     Dr. Mina Marble  . GERD (gastroesophageal reflux disease)   . Anxiety   . Crohn's   . Bartholin cyst 2008    vag.  [name] Perianal abscess 2009  . Vitamin B12 deficiency   . Arthritis     Dr. Eddie Dibbles  . Elevated glucose 2010  . Kidney stone   . BIPOLAR AFFECTIVE DISORDER 04/14/2007  . Depression    Past Surgical History  Procedure Laterality Date  . Elbow surgery      left  . Rectovaginal fistula closure      did not help   Allergies  Allergen Reactions  . Cephalexin   . Codeine   . Guaifenesin   . Imitrex [Sumatriptan]     2 fingers got very hot  . Morphine   . Omeprazole     Pt states that she has never taken this medication!  . Sulfonamide Derivatives    Current Outpatient Prescriptions  Medication Sig  Dispense Refill  . ALPRAZolam (XANAX) 0.5 MG tablet Take 1 tablet (0.5 mg total) by mouth at bedtime as needed.    . benazepril-hydrochlorthiazide (LOTENSIN HCT) 20-12.5 MG per tablet TAKE ONE TABLET BY MOUTH ONCE EVERY DAY    . cyanocobalamin (,VITAMIN B-12,) 1000 MCG/ML injection Inject 1 mL (1,000 mcg total) into the skin every 14 (fourteen) days.    . cyclobenzaprine (FLEXERIL) 10 MG tablet TAKE ONE HALF TO ONE TABLET BY MOUTH EVERY DAY AS NEEDED FOR MUSCLE SPASMS    . HYDROcodone-acetaminophen (NORCO) 10-325 MG tablet Take 1 tablet by mouth every 6 (six) hours as needed for severe pain. Please fill on or after 05/02/15 UP TO 4TIMES A DAY, USU 3/DAY.   [name] ketorolac (TORADOL) 10 MG tablet Take 1 tablet (10 mg total) by mouth every 6 (six) hours as needed. for pain    . meperidine (DEMEROL) 50 MG tablet Take 1 tablet (50 mg total) by mouth daily as needed. For sever pain <1-2X/WEEK   . norethindrone (AYGESTIN) 5 MG tablet TAKE ONE TABLET BY MOUTH ONCE EVERY DAY    . nystatin-triamcinolone ointment (MYCOLOG) Apply topically 2 (two) times daily.    . promethazine (PHENERGAN) 25 MG tablet Take 1 tablet (25 mg total) by mouth 2 (two) times daily as needed  for nausea. < 1-2X/WEEK   . sertraline (ZOLOFT) 100 MG tablet Take 100 mg by mouth 2 (two) times daily.      . SYRINGE-NEEDLE, DISP, 3 ML (B-D 3CC LUER-LOK SYR 25GX5/8") 25G X 5/8" 3 ML MISC As directed    .      .      .       Family History  Problem Relation Age of Onset  . Diabetes Mother   . Heart disease Mother   . Heart attack Mother   . Cancer Maternal Aunt     ovarian  . Breast cancer Maternal Grandmother   . Early death Maternal Grandmother 55  . Cancer Maternal Grandmother     breast  . Heart attack Father   . Heart disease Father   . Cancer Maternal Grandfather     colon   Social History  Substance Use Topics  . Smoking status: Current Every Day Smoker -- 0.50 packs/day for 25 years    Types: Cigarettes  . Smokeless  tobacco: Never Used  . Alcohol Use: No   1 ADOPTED KID-NO NATURAL. SEPARATED: COUPLE OF YEARS WORKED AT [employer]. 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   Review of Systems PER HPI OTHERWISE ALL SYSTEMS ARE NEGATIVE.    Objective:   Physical Exam  Constitutional: She is oriented to person, place, and time. She appears well-developed and well-nourished. No distress.  HENT:  Head: Normocephalic and atraumatic.  Mouth/Throat: Oropharynx is clear and moist. No oropharyngeal exudate.  Eyes: Pupils are equal, round, and reactive to light. No scleral icterus.  Neck: Normal range of motion. Neck supple.  Cardiovascular: Normal rate, regular rhythm and normal heart sounds.   Pulmonary/Chest: Effort normal and breath sounds normal. No respiratory distress.  Abdominal: Soft. Bowel sounds are normal. She exhibits no distension. There is no tenderness.  Genitourinary: Rectal exam shows external hemorrhoid, tenderness (PAIN WITH INSERTION OF FINGER IN ANAL CANAL. PT HAS PROMINENCE IN  R LATERAL PERINEAL AREA., NONTNEDER EXTERNALLY.) and anal tone abnormal. Rectal exam shows no mass.  Musculoskeletal: She exhibits no edema.  Lymphadenopathy:    She has no cervical adenopathy.  Neurological: She is alert and oriented to person, place, and time.  NO FOCAL DEFICITS  Psychiatric: She has a normal mood and affect.  Vitals reviewed.     Assessment & Plan:

## 2015-04-05 ENCOUNTER — Encounter (HOSPITAL_COMMUNITY): Payer: Self-pay | Admitting: Gastroenterology

## 2015-04-07 ENCOUNTER — Ambulatory Visit (INDEPENDENT_AMBULATORY_CARE_PROVIDER_SITE_OTHER): Payer: 59 | Admitting: Gastroenterology

## 2015-04-07 ENCOUNTER — Encounter: Payer: Self-pay | Admitting: Gastroenterology

## 2015-04-07 VITALS — BP 124/76 | HR 100 | Temp 98.9°F | Ht 62.0 in | Wt 159.6 lb

## 2015-04-07 DIAGNOSIS — K50119 Crohn's disease of large intestine with unspecified complications: Secondary | ICD-10-CM | POA: Diagnosis not present

## 2015-04-07 DIAGNOSIS — N824 Other female intestinal-genital tract fistulae: Secondary | ICD-10-CM | POA: Diagnosis not present

## 2015-04-07 DIAGNOSIS — K2970 Gastritis, unspecified, without bleeding: Secondary | ICD-10-CM | POA: Diagnosis not present

## 2015-04-07 DIAGNOSIS — K2990 Gastroduodenitis, unspecified, without bleeding: Secondary | ICD-10-CM

## 2015-04-07 NOTE — Patient Instructions (Signed)
I will discuss starting Entyvio with Dr. Oneida Alar. If she is in agreement, we will start approval process ASAP.

## 2015-04-07 NOTE — Progress Notes (Signed)
Primary Care Physician: [name], MD  Primary Gastroenterologist:  Barney Drain, MD   Chief Complaint  Patient presents with  . Crohn's Disease    HPI: [name] [name] is a 45 y.o. female here for follow up of EGD/TCS. 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 Deatra Ina and Dr. Renee Harder. Established care with [facility] 02/2015. Previously failed Imuran, Humira, Asacol. 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 Previously in research trial at [facility] for [employer]. Dr. Morton Stall 2006, could not help her with fistula per patient.   EGD/TCS 03/2015 by Dr. Oneida Alar showed severe erythema/edema/exudate and ulceration in the cecum and involving ICV. Unable to intubate the ileum. Limited exam of anal canal. Severe proctitis as well. EGD with erosive gastritis duodenitis. Biopsies from cecal and right colon with chronic active colitis. 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 Multiple bx negative for dysplasia. Duodenal bx with peptic duodenitis. Gastric bx with reactive gastropathy.  Continues to have difficulty passing stool. Feel like opening too narrow. Comes out vagina more easier then through rectum. High fiber makes it harder to pass stool. Some brbpr with wiping. Gets in [place] for relief. Takes lortab every day. Demerol for severe pain only. Toradol only 20 every 3 months. Appetite good. Heartburn controlled on omeprazole. Rare abdominal pain.    Current Outpatient Prescriptions  Medication Sig Dispense Refill  . ALPRAZolam (XANAX) 0.5 MG tablet Take 1 tablet (0.5 mg total) by mouth at bedtime as needed. (Patient taking differently: Take 0.5 mg by mouth 4 (four) times daily. ) 30 tablet 2  . benazepril-hydrochlorthiazide (LOTENSIN HCT) 20-12.5 MG per tablet TAKE ONE TABLET BY MOUTH ONCE EVERY DAY 30 tablet 11  . cyanocobalamin (,VITAMIN B-12,) 1000 MCG/ML injection Inject 1 mL (1,000 mcg  total) into the skin every 14 (fourteen) days. 10 mL 3  . cyclobenzaprine (FLEXERIL) 10 MG tablet TAKE ONE HALF TO ONE TABLET BY MOUTH EVERY DAY AS NEEDED FOR MUSCLE SPASMS 30 tablet 1  . HYDROcodone-acetaminophen (NORCO) 10-325 MG tablet Take 1 tablet by mouth every 6 (six) hours as needed for severe pain. Please fill on or after 05/02/15 120 tablet 0  . ketorolac (TORADOL) 10 MG tablet Take 1 tablet (10 mg total) by mouth every 6 (six) hours as needed. for pain 20 tablet 1  . lamoTRIgine (LAMICTAL) 200 MG tablet Take 1 tablet by mouth daily.    . meperidine (DEMEROL) 50 MG tablet Take 1 tablet (50 mg total) by mouth daily as needed. For sever pain 20 tablet 0  . norethindrone (AYGESTIN) 5 MG tablet TAKE ONE TABLET BY MOUTH ONCE EVERY DAY 30 tablet 10  . omeprazole (PRILOSEC) 20 MG capsule 1 PO 30 mins prior to breakfast and supper 60 capsule 11  . sertraline (ZOLOFT) 100 MG tablet Take 100 mg by mouth 2 (two) times daily.       No current facility-administered medications for this visit.    Allergies as of 04/07/2015 - Review Complete 04/07/2015  Allergen Reaction Noted  . Cephalexin    . Codeine    . Guaifenesin    . Imitrex [sumatriptan]  10/17/2011  . Morphine    . Omeprazole    . Sulfonamide derivatives      ROS:  General: Negative for anorexia, weight loss, fever, chills, fatigue, weakness. ENT: Negative for hoarseness, difficulty swallowing , nasal congestion. CV: Negative for chest pain, angina, palpitations, dyspnea on  exertion, peripheral edema.  Respiratory: Negative for dyspnea at rest, dyspnea on exertion, cough, sputum, wheezing.  GI: See history of present illness. GU:  Negative for dysuria, hematuria, urinary incontinence, urinary frequency, nocturnal urination.  Endo: Negative for unusual weight change.    Physical Examination:   BP 124/76 mmHg  Pulse 100  Temp(Src) 98.9 F (37.2 C) (Oral)  Ht 5\' 2"  (1.575 m)  Wt 159 lb 9.6 oz (72.394 kg)  BMI 29.18  kg/m2  General: Well-nourished, well-developed in no acute distress.  Eyes: No icterus. Mouth: Oropharyngeal mucosa moist and pink , no lesions erythema or exudate. Lungs: Clear to auscultation bilaterally.  Heart: Regular rate and rhythm, no murmurs rubs or gallops.  Abdomen: Bowel sounds are normal, nontender, mild RLQ tenderness, no hepatosplenomegaly or masses, no abdominal bruits or hernia , no rebound or guarding.   Extremities: No lower extremity edema. No clubbing or deformities. Neuro: Alert and oriented x 4   Skin: Warm and dry, no jaundice.   Psych: Alert and cooperative, normal mood and affect.  Labs:  Lab Results  Component Value Date   WBC 10.9* 03/25/2015   HGB 15.4* 03/25/2015   HCT 43.7 03/25/2015   MCV 88.6 03/25/2015   PLT 170 03/25/2015   Lab Results  Component Value Date   CREATININE 1.05* 03/25/2015   BUN 20 03/25/2015   NA 137 03/25/2015   K 3.7 03/25/2015   CL 104 03/25/2015   CO2 23 03/25/2015   Lab Results  Component Value Date   ALT 6 03/02/2015   AST 8 03/02/2015   ALKPHOS 72 03/02/2015   BILITOT 0.4 03/02/2015   No results found for: HAV, HEPAIGM, HEPBIGM, HEPBCAB, HBEAG, HEPCAB Lab Results  Component Value Date   VITAMINB12 257 03/02/2015     Imaging Studies: No results found.

## 2015-04-10 NOTE — Assessment & Plan Note (Signed)
Clinically doing well on omeprazole.

## 2015-04-10 NOTE — Assessment & Plan Note (Signed)
45 y/o female with complicated ileocolonic Crohn's disease (rectovaginal fistula) with evidence of active disease at ICV/right colon and rectum on recent colonoscopy. Previously failed Imuran, Humira, ?reactive to Remicade (did well on it for 3 years). To consider Entyvio. To discuss with Dr. Oneida Alar and proceed accordingly. Will need to discuss vaccine status.

## 2015-04-11 NOTE — Progress Notes (Addendum)
REVIEWED. PROCEED WITH ENTYVIO. PT NEEDS TO SEE A SURGEON SPECIaLIZING IN PT W/ IBD. SHE NEEDS TO  DISCUSS HER OPTIONS IF A BIOLOGIC AGENT DOES NOT RESOLVE HER FISTULA.

## 2015-04-11 NOTE — Progress Notes (Signed)
CC'ED TO PCP 

## 2015-04-12 NOTE — Progress Notes (Signed)
Please see Dr. Oneida Alar recommendations.  Offer her a referral to a surgeon specializing in IBD in case her fistula does not resolve with Entyvio. Please start process to get [facility] approved.

## 2015-04-12 NOTE — Progress Notes (Signed)
Tried to call pt twice and the call could not be completed. Mailing a letter to call.

## 2015-04-12 NOTE — Progress Notes (Signed)
Forwarding to Almyra Free to get the [place] approval process started.

## 2015-04-14 NOTE — Progress Notes (Signed)
Paperwork on [employer]

## 2015-04-18 ENCOUNTER — Telehealth: Payer: Self-pay

## 2015-04-18 NOTE — Telephone Encounter (Signed)
Paperwork was on [facility] desk.

## 2015-04-18 NOTE — Telephone Encounter (Signed)
Pt returned call and does not want a referral to surgeon until after she has started the [facility]. She would like to know if we have already started the process for the [facility].

## 2015-04-20 NOTE — Telephone Encounter (Signed)
Noted. Paperwork completed and returned to [place].

## 2015-04-21 NOTE — Telephone Encounter (Signed)
PA done and faxed to the insurance company. Pt will have to get infusions at [facility]. The pharmacy at [facility] will not accept medications from an outside pharmacy. Once approved with the insurance, it will be ordered by the [facility] pharmacy.

## 2015-04-29 ENCOUNTER — Ambulatory Visit (INDEPENDENT_AMBULATORY_CARE_PROVIDER_SITE_OTHER): Payer: 59 | Admitting: Gastroenterology

## 2015-04-29 ENCOUNTER — Encounter: Payer: Self-pay | Admitting: Gastroenterology

## 2015-04-29 ENCOUNTER — Other Ambulatory Visit: Payer: Self-pay

## 2015-04-29 VITALS — BP 110/67 | HR 118 | Temp 98.3°F | Ht 62.0 in | Wt 154.8 lb

## 2015-04-29 DIAGNOSIS — R011 Cardiac murmur, unspecified: Secondary | ICD-10-CM | POA: Diagnosis not present

## 2015-04-29 DIAGNOSIS — K50013 Crohn's disease of small intestine with fistula: Secondary | ICD-10-CM

## 2015-04-29 DIAGNOSIS — N824 Other female intestinal-genital tract fistulae: Secondary | ICD-10-CM

## 2015-04-29 DIAGNOSIS — R9431 Abnormal electrocardiogram [ECG] [EKG]: Secondary | ICD-10-CM

## 2015-04-29 MED ORDER — CIPROFLOXACIN HCL 500 MG PO TABS: 500.0000 mg | ORAL_TABLET | Freq: Two times a day (BID) | ORAL | Status: DC

## 2015-04-29 NOTE — Progress Notes (Signed)
Primary Care Physician: [name], MD  Primary Gastroenterologist:  Barney Drain, MD   Chief Complaint  Patient presents with  . Follow-up    HPI: [name] [name] is a 45 y.o. female here for short interval follow-up. 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 Deatra Ina and Dr. Renee Harder. Established care with [facility] in January 2017. Previously failed Imuran, Humira, Cimzial, clinical trial of Prochymal. 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 Has seen by Dr. Morton Stall in 2006.  EGD/colonoscopy in February 2017 by Dr. Brayton Caves severe erythema/edema/exudate and ulceration in the cecum and involving ICV. Unable to intubate the ileum. Limited exam of anal canal. Severe proctitis as well. EGD with erosive gastritis duodenitis. Biopsies from cecal and right colon with chronic active colitis. 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 Multiple bx negative for dysplasia. Duodenal bx with peptic duodenitis. Gastric bx with reactive gastropathy.  We have been waiting on approval for Entyvio. Patient wants to receive Shingles vaccine prior to starting Entyvio. She states her PCP has declined her request because she is only 67. She has had the chicken pox in her teenage years. She reports receiving pneumococcal pneumonia vaccines in the past and one booster. Continues to have issues related to rectovaginal fistula. Passes stools and vagina are regular basis. Keep stool soft make it easier to pass. Bright red blood with wiping. She used to utilize a Customer service manager for relief of perianal pain and what she describes as multiple boils. Tammi Klippel is broken. She's been having more difficulty now. Takes Lortab every day. Demerol for severe pain only. She swears by using Toradol for her fistulizing Crohn's stating that helps things clear up. Also will use cipro when needed. Her doctor only gives her 20 every  3 months so she uses them sparingly. Her appetite is remaining good. Heartburn is well-controlled on PPI. She denies any abdominal pain.   Current Outpatient Prescriptions  Medication Sig Dispense Refill  . ALPRAZolam (XANAX) 0.5 MG tablet Take 1 tablet (0.5 mg total) by mouth at bedtime as needed. (Patient taking differently: Take 0.5 mg by mouth 4 (four) times daily. ) 30 tablet 2  . benazepril-hydrochlorthiazide (LOTENSIN HCT) 20-12.5 MG per tablet TAKE ONE TABLET BY MOUTH ONCE EVERY DAY 30 tablet 11  . cyanocobalamin (,VITAMIN B-12,) 1000 MCG/ML injection Inject 1 mL (1,000 mcg total) into the skin every 14 (fourteen) days. 10 mL 3  . cyclobenzaprine (FLEXERIL) 10 MG tablet TAKE ONE HALF TO ONE TABLET BY MOUTH EVERY DAY AS NEEDED FOR MUSCLE SPASMS 30 tablet 1  . HYDROcodone-acetaminophen (NORCO) 10-325 MG tablet Take 1 tablet by mouth every 6 (six) hours as needed for severe pain. Please fill on or after 05/02/15 120 tablet 0  . ketorolac (TORADOL) 10 MG tablet Take 1 tablet (10 mg total) by mouth every 6 (six) hours as needed. for pain 20 tablet 1  . lamoTRIgine (LAMICTAL) 200 MG tablet Take 1 tablet by mouth daily.    . meperidine (DEMEROL) 50 MG tablet Take 1 tablet (50 mg total) by mouth daily as needed. For sever pain 20 tablet 0  . norethindrone (AYGESTIN) 5 MG tablet TAKE ONE TABLET BY MOUTH ONCE EVERY DAY 30 tablet 10  . omeprazole (PRILOSEC) 20 MG capsule 1 PO 30 mins prior to breakfast and supper 60 capsule 11  . sertraline (ZOLOFT) 100 MG tablet Take 100 mg by mouth 2 (two) times daily.  No current facility-administered medications for this visit.    Allergies as of 04/29/2015 - Review Complete 04/29/2015  Allergen Reaction Noted  . Cephalexin    . Codeine    . Guaifenesin    . Imitrex [sumatriptan]  10/17/2011  . Morphine    . Omeprazole    . Sulfonamide derivatives     Past Medical History  Diagnosis Date  . LBP (low back pain)     Dr. Mina Marble  . GERD  (gastroesophageal reflux disease)   . Anxiety   . Crohn's   . Bartholin cyst 2008    vag.  [name] Perianal abscess 2009  . Vitamin B12 deficiency   . Arthritis     Dr. Eddie Dibbles  . Elevated glucose 2010  . Kidney stone   . BIPOLAR AFFECTIVE DISORDER 04/14/2007  . Depression   . Hypertension    Past Surgical History  Procedure Laterality Date  . Elbow surgery      left  . Rectovaginal fistula closure      did not help  . Colonoscopy with propofol N/A 03/29/2015    SLF: Severe proctocolitis limitied to cecum and rectum. Limited exam of the colon mucosa and anal canal.   . Esophagogastroduodenoscopy (egd) with propofol N/A 03/29/2015    SLF: 1. Erosive gastritis duodentitis.    ROS:  General: Negative for anorexia, weight loss, fever, chills, fatigue, weakness. ENT: Negative for hoarseness, difficulty swallowing , nasal congestion. CV: Negative for chest pain, angina, palpitations, dyspnea on exertion, peripheral edema.  Respiratory: Negative for dyspnea at rest, dyspnea on exertion, cough, sputum, wheezing.  GI: See history of present illness. GU:  Negative for dysuria, hematuria, urinary incontinence, urinary frequency, nocturnal urination.  Endo: Negative for unusual weight change.    Physical Examination:   BP 110/67 mmHg  Pulse 118  Temp(Src) 98.3 F (36.8 C) (Oral)  Ht 5\' 2"  (1.575 m)  Wt 154 lb 12.8 oz (70.217 kg)  BMI 28.31 kg/m2  General: Well-nourished, well-developed in no acute distress.  Eyes: No icterus. Mouth: Oropharyngeal mucosa moist and pink , no lesions erythema or exudate. Lungs: Clear to auscultation bilaterally.  Heart: Regular rate and rhythm, no rubs or gallops. IV/VI SEM Abdomen: Bowel sounds are normal, nontender, nondistended, no hepatosplenomegaly or masses, no abdominal bruits or hernia , no rebound or guarding.   Rectal: One indurated slightly erythematous lesion in perianal area without drainage. Evidence of multiple prior lesions with noted scaring.  No internal exam. Extremities: No lower extremity edema. No clubbing or deformities. Neuro: Alert and oriented x 4   Skin: Warm and dry, no jaundice.   Psych: Alert and cooperative, normal mood and affect.  Labs:  Lab Results  Component Value Date   WBC 10.9* 03/25/2015   HGB 15.4* 03/25/2015   HCT 43.7 03/25/2015   MCV 88.6 03/25/2015   PLT 170 03/25/2015   Lab Results  Component Value Date   CREATININE 1.05* 03/25/2015   BUN 20 03/25/2015   NA 137 03/25/2015   K 3.7 03/25/2015   CL 104 03/25/2015   CO2 23 03/25/2015   Lab Results  Component Value Date   ALT 6 03/02/2015   AST 8 03/02/2015   ALKPHOS 72 03/02/2015   BILITOT 0.4 03/02/2015    Imaging Studies: No results found.

## 2015-04-29 NOTE — Patient Instructions (Signed)
1. We will let you know status of Entyvio approval as soon as we know. 2. Get the shingles vaccine as soon as possible because you will have to wait 4 weeks before starting Entyvio once you get the vaccine.  3. Use Toradol sparingly. 4. Cipro twice a day as needed for boils.  5. I recommend referral to cardiologist for abnormal EKG and heart murmur.

## 2015-05-03 NOTE — Assessment & Plan Note (Signed)
Patient has SEM and tachycardia noted on exam. She had abnormal EKG 03/2015 during pre-op visit with age indeterminate lateral and inferior MI. Recommend cardiology referral.

## 2015-05-03 NOTE — Assessment & Plan Note (Addendum)
45 y/o female with complicated ileocolonic Crohn's disease (rectovaginal fistula) with evidence of active disease at ICV/right colon and rectum on recent colonoscopy. Previously failed Imuran, Humira, Cimizia, ?reactive to Remicade (did well on it for 3 years). Planning to start [facility], awaiting approval by insurance. Patient should consider Zoster vaccine prior to initiating biologics. She is aware that she will have to wait four weeks from time of vaccine until we start Enytvio. In the meantime, I have given her RX for cipro to have on hand for perianal disease.   Patient reports she is getting her old records together and will drop off for review.

## 2015-05-05 NOTE — Progress Notes (Signed)
cc'ed to pcp °

## 2015-05-23 ENCOUNTER — Encounter: Payer: Self-pay | Admitting: Internal Medicine

## 2015-05-23 ENCOUNTER — Telehealth: Payer: Self-pay

## 2015-05-23 ENCOUNTER — Telehealth: Payer: Self-pay | Admitting: Internal Medicine

## 2015-05-23 ENCOUNTER — Ambulatory Visit (INDEPENDENT_AMBULATORY_CARE_PROVIDER_SITE_OTHER): Payer: 59 | Admitting: Internal Medicine

## 2015-05-23 VITALS — BP 140/62 | HR 113 | Wt 152.0 lb

## 2015-05-23 DIAGNOSIS — K612 Anorectal abscess: Secondary | ICD-10-CM

## 2015-05-23 DIAGNOSIS — K50013 Crohn's disease of small intestine with fistula: Secondary | ICD-10-CM | POA: Diagnosis not present

## 2015-05-23 DIAGNOSIS — F411 Generalized anxiety disorder: Secondary | ICD-10-CM

## 2015-05-23 DIAGNOSIS — M5440 Lumbago with sciatica, unspecified side: Secondary | ICD-10-CM

## 2015-05-23 DIAGNOSIS — E538 Deficiency of other specified B group vitamins: Secondary | ICD-10-CM | POA: Diagnosis not present

## 2015-05-23 DIAGNOSIS — I10 Essential (primary) hypertension: Secondary | ICD-10-CM

## 2015-05-23 DIAGNOSIS — G8929 Other chronic pain: Secondary | ICD-10-CM

## 2015-05-23 MED ORDER — HYDROCODONE-ACETAMINOPHEN 10-325 MG PO TABS: 1.0000 | ORAL_TABLET | Freq: Four times a day (QID) | ORAL | Status: DC | PRN

## 2015-05-23 MED ORDER — CYANOCOBALAMIN 1000 MCG/ML IJ SOLN: 1000.0000 ug | INTRAMUSCULAR | Status: DC

## 2015-05-23 MED ORDER — CYCLOBENZAPRINE HCL 10 MG PO TABS: ORAL_TABLET | ORAL | Status: DC

## 2015-05-23 MED ORDER — MEPERIDINE HCL 50 MG PO TABS: 50.0000 mg | ORAL_TABLET | Freq: Every day | ORAL | Status: DC | PRN

## 2015-05-23 MED ORDER — KETOROLAC TROMETHAMINE 10 MG PO TABS: 10.0000 mg | ORAL_TABLET | Freq: Four times a day (QID) | ORAL | Status: DC | PRN

## 2015-05-23 NOTE — Telephone Encounter (Signed)
States norco script should have been written for the 3rd and not the 13th.  Is requesting change.  States you can call pharmacy, [facility] in [place] [address] to confirm.

## 2015-05-23 NOTE — Assessment & Plan Note (Signed)
Lotensin HCT

## 2015-05-23 NOTE — Telephone Encounter (Signed)
[name] [name] has been approved by her insurance from 05/20/15 until 09/19/15.  Routing to LSL and DS.

## 2015-05-23 NOTE — Assessment & Plan Note (Signed)
Chronic Xanax prn  Potential benefits of a long term benzodiazepines  use as well as potential risks  and complications were explained to the patient and were aknowledged. Do not take w/pain meds.

## 2015-05-23 NOTE — Assessment & Plan Note (Signed)
2/17 colonoscopy w/proctocolitis. Planning to start [facility]

## 2015-05-23 NOTE — Assessment & Plan Note (Signed)
On B12 

## 2015-05-23 NOTE — Assessment & Plan Note (Signed)
On Rx: Norco; Toradol, Demerol prn Chronic and severe On disabiity  Potential benefits of a long term opioids use as well as potential risks (i.e. addiction risk, apnea etc) and complications (i.e. Somnolence, constipation and others) were explained to the patient and were aknowledged. 

## 2015-05-23 NOTE — Telephone Encounter (Signed)
Ok Pls change Thx

## 2015-05-23 NOTE — Progress Notes (Signed)
Pre visit review using our clinic review tool, if applicable. No additional management support is needed unless otherwise documented below in the visit note. 

## 2015-05-23 NOTE — Progress Notes (Signed)
Subjective:  Patient ID: [name] [name], female    DOB: [birth year]  Age: 45 y.o. MRN: [id number]  CC: No chief complaint on file.   HPI JANESHA [name] presents for Crohn's: 2/17 colonoscopy w/proctocolitis. Planning to start Enytvo F/u LBP, HTN, B12 def, anxiety  Outpatient Prescriptions Prior to Visit  Medication Sig Dispense Refill  . ALPRAZolam (XANAX) 0.5 MG tablet Take 1 tablet (0.5 mg total) by mouth at bedtime as needed. (Patient taking differently: Take 0.5 mg by mouth 4 (four) times daily. ) 30 tablet 2  . benazepril-hydrochlorthiazide (LOTENSIN HCT) 20-12.5 MG per tablet TAKE ONE TABLET BY MOUTH ONCE EVERY DAY 30 tablet 11  . ciprofloxacin (CIPRO) 500 MG tablet Take 1 tablet (500 mg total) by mouth 2 (two) times daily. 20 tablet 0  . lamoTRIgine (LAMICTAL) 200 MG tablet Take 1 tablet by mouth daily.    . norethindrone (AYGESTIN) 5 MG tablet TAKE ONE TABLET BY MOUTH ONCE EVERY DAY 30 tablet 10  . omeprazole (PRILOSEC) 20 MG capsule 1 PO 30 mins prior to breakfast and supper 60 capsule 11  . sertraline (ZOLOFT) 100 MG tablet Take 100 mg by mouth 2 (two) times daily.      . cyanocobalamin (,VITAMIN B-12,) 1000 MCG/ML injection Inject 1 mL (1,000 mcg total) into the skin every 14 (fourteen) days. 10 mL 3  . cyclobenzaprine (FLEXERIL) 10 MG tablet TAKE ONE HALF TO ONE TABLET BY MOUTH EVERY DAY AS NEEDED FOR MUSCLE SPASMS 30 tablet 1  . HYDROcodone-acetaminophen (NORCO) 10-325 MG tablet Take 1 tablet by mouth every 6 (six) hours as needed for severe pain. Please fill on or after 05/02/15 120 tablet 0  . ketorolac (TORADOL) 10 MG tablet Take 1 tablet (10 mg total) by mouth every 6 (six) hours as needed. for pain 20 tablet 1  . meperidine (DEMEROL) 50 MG tablet Take 1 tablet (50 mg total) by mouth daily as needed. For sever pain 20 tablet 0   No facility-administered medications prior to visit.    ROS Review of Systems  Constitutional: Positive for fatigue. Negative for chills,  activity change, appetite change and unexpected weight change.  HENT: Negative for congestion, mouth sores and sinus pressure.   Eyes: Negative for visual disturbance.  Respiratory: Negative for cough and chest tightness.   Gastrointestinal: Positive for nausea, abdominal pain and diarrhea.  Genitourinary: Negative for frequency, difficulty urinating and vaginal pain.  Musculoskeletal: Positive for back pain and arthralgias. Negative for gait problem.  Skin: Negative for pallor and rash.  Neurological: Negative for dizziness, tremors, weakness, numbness and headaches.  Psychiatric/Behavioral: Negative for confusion and sleep disturbance. The patient is nervous/anxious.     Objective:  BP 140/62 mmHg  Pulse 113  Wt 152 lb (68.947 kg)  SpO2 98%  BP Readings from Last 3 Encounters:  05/23/15 140/62  04/29/15 110/67  04/07/15 124/76    Wt Readings from Last 3 Encounters:  05/23/15 152 lb (68.947 kg)  04/29/15 154 lb 12.8 oz (70.217 kg)  04/07/15 159 lb 9.6 oz (72.394 kg)    Physical Exam  Constitutional: She appears well-developed. No distress.  HENT:  Head: Normocephalic.  Right Ear: External ear normal.  Left Ear: External ear normal.  Nose: Nose normal.  Mouth/Throat: Oropharynx is clear and moist.  Eyes: Conjunctivae are normal. Pupils are equal, round, and reactive to light. Right eye exhibits no discharge. Left eye exhibits no discharge.  Neck: Normal range of motion. Neck supple. No JVD present. No tracheal  deviation present. No thyromegaly present.  Cardiovascular: Normal rate, regular rhythm and normal heart sounds.   Pulmonary/Chest: No stridor. No respiratory distress. She has no wheezes.  Abdominal: Soft. Bowel sounds are normal. She exhibits no distension and no mass. There is tenderness. There is no rebound and no guarding.  Musculoskeletal: She exhibits tenderness. She exhibits no edema.  Lymphadenopathy:    She has no cervical adenopathy.  Neurological: She  displays normal reflexes. No cranial nerve deficit. She exhibits normal muscle tone. Coordination normal.  Skin: No rash noted. No erythema.  Psychiatric: She has a normal mood and affect. Her behavior is normal. Judgment and thought content normal.  Obese  Lab Results  Component Value Date   WBC 10.9* 03/25/2015   HGB 15.4* 03/25/2015   HCT 43.7 03/25/2015   PLT 170 03/25/2015   GLUCOSE 148* 03/25/2015   CHOL 168 08/30/2014   TRIG 99.0 08/30/2014   HDL 27.40* 08/30/2014   LDLDIRECT 171.9 05/01/2011   LDLCALC 121* 08/30/2014   ALT 6 03/02/2015   AST 8 03/02/2015   NA 137 03/25/2015   K 3.7 03/25/2015   CL 104 03/25/2015   CREATININE 1.05* 03/25/2015   BUN 20 03/25/2015   CO2 23 03/25/2015   TSH 1.83 08/30/2014    No results found.  Assessment & Plan:   Diagnoses and all orders for this visit:  Abscess of anal and rectal regions -     meperidine (DEMEROL) 50 MG tablet; Take 1 tablet (50 mg total) by mouth daily as needed. For sever pain  Crohn's disease of ileum with fistula ([place])  Essential hypertension, benign  B12 nutritional deficiency  Chronic midline low back pain with sciatica, sciatica laterality unspecified  Anxiety state  Other orders -     cyanocobalamin (,VITAMIN B-12,) 1000 MCG/ML injection; Inject 1 mL (1,000 mcg total) into the skin every 14 (fourteen) days. -     cyclobenzaprine (FLEXERIL) 10 MG tablet; TAKE ONE HALF TO ONE TABLET BY MOUTH EVERY DAY AS NEEDED FOR MUSCLE SPASMS -     Discontinue: HYDROcodone-acetaminophen (NORCO) 10-325 MG tablet; Take 1 tablet by mouth every 6 (six) hours as needed for severe pain. Please fill on or after 06/02/15 -     ketorolac (TORADOL) 10 MG tablet; Take 1 tablet (10 mg total) by mouth every 6 (six) hours as needed. for pain -     Discontinue: HYDROcodone-acetaminophen (NORCO) 10-325 MG tablet; Take 1 tablet by mouth every 6 (six) hours as needed for severe pain. Please fill on or after 07/02/15 -      HYDROcodone-acetaminophen (NORCO) 10-325 MG tablet; Take 1 tablet by mouth every 6 (six) hours as needed for severe pain. Please fill on or after 08/02/15   I have discontinued Ms. Clausing's HYDROcodone-acetaminophen and HYDROcodone-acetaminophen. I have also changed her HYDROcodone-acetaminophen. Additionally, I am having her maintain her sertraline, ALPRAZolam, benazepril-hydrochlorthiazide, norethindrone, lamoTRIgine, omeprazole, ciprofloxacin, cyanocobalamin, cyclobenzaprine, ketorolac, and meperidine.  Meds ordered this encounter  Medications  . cyanocobalamin (,VITAMIN B-12,) 1000 MCG/ML injection    Sig: Inject 1 mL (1,000 mcg total) into the skin every 14 (fourteen) days.    Dispense:  10 mL    Refill:  3  . cyclobenzaprine (FLEXERIL) 10 MG tablet    Sig: TAKE ONE HALF TO ONE TABLET BY MOUTH EVERY DAY AS NEEDED FOR MUSCLE SPASMS    Dispense:  30 tablet    Refill:  1  . DISCONTD: HYDROcodone-acetaminophen (NORCO) 10-325 MG tablet    Sig: Take  1 tablet by mouth every 6 (six) hours as needed for severe pain. Please fill on or after 06/02/15    Dispense:  120 tablet    Refill:  0  . ketorolac (TORADOL) 10 MG tablet    Sig: Take 1 tablet (10 mg total) by mouth every 6 (six) hours as needed. for pain    Dispense:  20 tablet    Refill:  1  . meperidine (DEMEROL) 50 MG tablet    Sig: Take 1 tablet (50 mg total) by mouth daily as needed. For sever pain    Dispense:  20 tablet    Refill:  0  . DISCONTD: HYDROcodone-acetaminophen (NORCO) 10-325 MG tablet    Sig: Take 1 tablet by mouth every 6 (six) hours as needed for severe pain. Please fill on or after 07/02/15    Dispense:  120 tablet    Refill:  0  . HYDROcodone-acetaminophen (NORCO) 10-325 MG tablet    Sig: Take 1 tablet by mouth every 6 (six) hours as needed for severe pain. Please fill on or after 08/02/15    Dispense:  120 tablet    Refill:  0     Follow-up: Return in about 3 months (around 08/22/2015) for a follow-up  visit.  [name], MD

## 2015-05-24 MED ORDER — HYDROCODONE-ACETAMINOPHEN 10-325 MG PO TABS: 1.0000 | ORAL_TABLET | Freq: Four times a day (QID) | ORAL | Status: DC | PRN

## 2015-05-24 NOTE — Telephone Encounter (Signed)
New Rxs updated. Printed. Signed/upfront for p/u. Pt to bring original scripts back to [place] to destroy.

## 2015-05-24 NOTE — Telephone Encounter (Signed)
Noted  

## 2015-05-25 NOTE — Telephone Encounter (Signed)
Patient had live vaccine on 04/29/15 for shingles.  She can start Entyvio AFTER 05/30/15.  Can we make arrangements? I will likely have to provide premedication orders to day surgery.

## 2015-05-25 NOTE — Telephone Encounter (Signed)
pts husband brought in original scripts and was given new scripts

## 2015-05-26 ENCOUNTER — Encounter: Payer: Self-pay | Admitting: Gastroenterology

## 2015-05-26 NOTE — Telephone Encounter (Signed)
I have filled out the paperwork and placed it on [facility] desk. Routing to [place], this is an [facility] pt.

## 2015-05-26 NOTE — Telephone Encounter (Signed)
Please schedule ov with [facility]

## 2015-05-26 NOTE — Telephone Encounter (Signed)
Orders faxed to [facility] with note on the front page SCHEDULE AFTER 05/30/2015.

## 2015-05-26 NOTE — Telephone Encounter (Signed)
Paper work completed.  She can start Entyvio AFTER 05/30/15. Needs OV follow up with [facility] in 8 weeks.

## 2015-05-30 NOTE — Telephone Encounter (Signed)
Melanie from the precert office called- pt needs prior auth for infusion now. I called [place], spoke with Joneen Caraway, it has been approved until July 10th, reference number is [id number]. I called Melanie back and left this on her voicemail.

## 2015-05-30 NOTE — Discharge Instructions (Signed)
Vedolizumab injection solution °What is this medicine? °VEDOLIZUMAB (Ve doe LIZ you mab) is used to treat ulcerative colitis and Crohn's disease in adult patients. °This medicine may be used for other purposes; ask your health care provider or pharmacist if you have questions. °What should I tell my health care provider before I take this medicine? °They need to know if you have any of these conditions: °-diabetes °-hepatitis B or history of hepatitis B infection °-HIV or AIDS °-immune system problems °-infection or history of infections °-liver disease °-recently received or scheduled to receive a vaccine °-scheduled to have surgery °-tuberculosis, a positive skin test for tuberculosis or have recently been in close contact with someone who has tuberculosis °- °an unusual or allergic reaction to vedolizumab, other medicines, foods, dyes, or preservatives °-pregnant or trying to get pregnant °-breast-feeding °How should I use this medicine? °This medicine is for infusion into a vein. It is given by a health care professional in a hospital or clinic setting. °A special MedGuide will be given to you by the pharmacist with each prescription and refill. Be sure to read this information carefully each time. °Talk to your pediatrician regarding the use of this medicine in children. This medicine is not approved for use in children. °Overdosage: If you think you have taken too much of this medicine contact a poison control center or emergency room at once. °NOTE: This medicine is only for you. Do not share this medicine with others. °What if I miss a dose? °It is important not to miss your dose. Call your doctor or health care professional if you are unable to keep an appointment. °What may interact with this medicine? °-steroid medicines like prednisone or cortisone °-TNF-alpha inhibitors like natalizumab, adalimumab, and infliximab °-vaccines °This list may not describe all possible interactions. Give your health care  provider a list of all the medicines, herbs, non-prescription drugs, or dietary supplements you use. Also tell them if you smoke, drink alcohol, or use illegal drugs. Some items may interact with your medicine. °What should I watch for while using this medicine? °Your condition will be monitored carefully while you are receiving this medicine. Visit your doctor for regular check ups. Tell your doctor or healthcare professional if your symptoms do not start to get better or if they get worse. °Stay away from people who are sick. Call your doctor or health care professional for advice if you get a fever, chills or sore throat, or other symptoms of a cold or flu. Do not treat yourself. °In some patients, this medicine may cause a serious brain infection that may cause death. If you have any problems seeing, thinking, speaking, walking, or standing, tell your doctor right away. If you cannot reach your doctor, get urgent medical care. °What side effects may I notice from receiving this medicine? °Side effects that you should report to your doctor or health care professional as soon as possible: °-allergic reactions like skin rash, itching or hives, swelling of the face, lips, or tongue °-breathing problems °-changes in vision °-chest pain °-dark urine °-depression, feelings of sadness °-dizziness °-general ill feeling or flu-like symptoms °-irregular, missed, or painful menstrual periods °-light-colored stools °-loss of appetite, nausea °-muscle weakness °-problems with balance, talking, or walking °-right upper belly pain °-unusually weak or tired °-yellowing of the eyes or skin °Side effects that usually do not require medical attention (Report these to your doctor or health care professional if they continue or are bothersome.): °-aches, pains °-headache °-stomach upset °-tiredness °  This list may not describe all possible side effects. Call your doctor for medical advice about side effects. You may report side  effects to FDA at 1-800-FDA-1088. °Where should I keep my medicine? °This drug is given in a hospital or clinic and will not be stored at home. °NOTE: This sheet is a summary. It may not cover all possible information. If you have questions about this medicine, talk to your doctor, pharmacist, or health care provider. °  °© 2016, [employer]. (2012-07-10 12:32:57) ° °

## 2015-05-30 NOTE — Telephone Encounter (Signed)
Spoke with Bonnita Nasuti at [facility] [phone]) she said the hospital was allowed to use their stock of entyvio and do a [place] and Bill when the pt gets her infusion.

## 2015-05-31 ENCOUNTER — Ambulatory Visit: Payer: 59 | Admitting: Internal Medicine

## 2015-05-31 ENCOUNTER — Encounter (HOSPITAL_COMMUNITY)
Admission: RE | Admit: 2015-05-31 | Discharge: 2015-05-31 | Disposition: A | Discharge status: Home / Self Care | Payer: 59 | Source: Ambulatory Visit | Attending: Gastroenterology | Admitting: Gastroenterology

## 2015-05-31 MED ORDER — SODIUM CHLORIDE 0.9 % IV SOLN: Freq: Once | INTRAVENOUS | Status: DC

## 2015-05-31 MED ORDER — VEDOLIZUMAB 300 MG IV SOLR: 300.0000 mg | Freq: Once | INTRAVENOUS | Status: DC

## 2015-05-31 MED ORDER — METHYLPREDNISOLONE SODIUM SUCC 125 MG IJ SOLR: 40.0000 mg | Freq: Once | INTRAMUSCULAR | Status: DC

## 2015-06-01 NOTE — Telephone Encounter (Signed)
Noted  

## 2015-06-02 DIAGNOSIS — J209 Acute bronchitis, unspecified: Secondary | ICD-10-CM | POA: Diagnosis not present

## 2015-06-06 ENCOUNTER — Encounter (HOSPITAL_COMMUNITY)
Admission: RE | Admit: 2015-06-06 | Discharge: 2015-06-06 | Disposition: A | Discharge status: Home / Self Care | Payer: 59 | Source: Ambulatory Visit | Attending: Gastroenterology | Admitting: Gastroenterology

## 2015-06-06 ENCOUNTER — Telehealth: Payer: Self-pay | Admitting: Gastroenterology

## 2015-06-06 NOTE — Progress Notes (Signed)
Pt called to reschedule infusion due to having been on antibiotics for bronchitis. Told to call the office to see when she can safely have her first infusion.

## 2015-06-06 NOTE — Telephone Encounter (Signed)
PATIENT HAD PREDNISONE SHOT QUICK ACTING AND IS TAKING Z PACK, CAN SHE STILL HAVE HER ENTIVIO INJECTION ON MAY 1ST

## 2015-06-07 NOTE — Telephone Encounter (Signed)
Pt was treated for bronchitis and completed the Z-pak on Sunday.

## 2015-06-07 NOTE — Telephone Encounter (Signed)
Forwarding to Leslie Lewis, PA to advise! 

## 2015-06-07 NOTE — Telephone Encounter (Signed)
OK for entyvio injection on May 1st. Call if develop recurrent signs of infection.

## 2015-06-07 NOTE — Telephone Encounter (Signed)
What kind of infection is she being treated for? When will she complete Zpack?

## 2015-06-07 NOTE — Telephone Encounter (Signed)
Pt is aware.  

## 2015-06-20 ENCOUNTER — Encounter (HOSPITAL_COMMUNITY)
Admission: RE | Admit: 2015-06-20 | Discharge: 2015-06-20 | Disposition: A | Discharge status: Home / Self Care | Payer: 59 | Source: Ambulatory Visit | Attending: Gastroenterology | Admitting: Gastroenterology

## 2015-06-20 ENCOUNTER — Other Ambulatory Visit: Payer: Self-pay | Admitting: Internal Medicine

## 2015-06-20 DIAGNOSIS — K50813 Crohn's disease of both small and large intestine with fistula: Secondary | ICD-10-CM | POA: Insufficient documentation

## 2015-06-20 MED ORDER — VEDOLIZUMAB 300 MG IV SOLR
300.0000 mg | Freq: Once | INTRAVENOUS | Status: AC
  Administered 2015-06-20: 300 mg via INTRAVENOUS
  Filled 2015-06-20: qty 5

## 2015-06-20 MED ORDER — SODIUM CHLORIDE 0.9 % IV SOLN
INTRAVENOUS | Status: DC
  Administered 2015-06-20: 08:00:00 via INTRAVENOUS

## 2015-06-20 MED ORDER — ACETAMINOPHEN 325 MG PO TABS: 650.0000 mg | ORAL_TABLET | Freq: Once | ORAL | Status: DC

## 2015-06-20 MED ORDER — METHYLPREDNISOLONE SODIUM SUCC 40 MG IJ SOLR
40.0000 mg | Freq: Once | INTRAMUSCULAR | Status: AC
  Administered 2015-06-20: 40 mg via INTRAVENOUS
  Filled 2015-06-20: qty 1

## 2015-06-20 MED ORDER — LORATADINE 10 MG PO TABS: 10.0000 mg | ORAL_TABLET | Freq: Every day | ORAL | Status: DC

## 2015-06-20 NOTE — Discharge Instructions (Signed)
Vedolizumab injection solution °What is this medicine? °VEDOLIZUMAB (Ve doe LIZ you mab) is used to treat ulcerative colitis and Crohn's disease in adult patients. °This medicine may be used for other purposes; ask your health care provider or pharmacist if you have questions. °What should I tell my health care provider before I take this medicine? °They need to know if you have any of these conditions: °-diabetes °-hepatitis B or history of hepatitis B infection °-HIV or AIDS °-immune system problems °-infection or history of infections °-liver disease °-recently received or scheduled to receive a vaccine °-scheduled to have surgery °-tuberculosis, a positive skin test for tuberculosis or have recently been in close contact with someone who has tuberculosis °- °an unusual or allergic reaction to vedolizumab, other medicines, foods, dyes, or preservatives °-pregnant or trying to get pregnant °-breast-feeding °How should I use this medicine? °This medicine is for infusion into a vein. It is given by a health care professional in a hospital or clinic setting. °A special MedGuide will be given to you by the pharmacist with each prescription and refill. Be sure to read this information carefully each time. °Talk to your pediatrician regarding the use of this medicine in children. This medicine is not approved for use in children. °Overdosage: If you think you have taken too much of this medicine contact a poison control center or emergency room at once. °NOTE: This medicine is only for you. Do not share this medicine with others. °What if I miss a dose? °It is important not to miss your dose. Call your doctor or health care professional if you are unable to keep an appointment. °What may interact with this medicine? °-steroid medicines like prednisone or cortisone °-TNF-alpha inhibitors like natalizumab, adalimumab, and infliximab °-vaccines °This list may not describe all possible interactions. Give your health care  provider a list of all the medicines, herbs, non-prescription drugs, or dietary supplements you use. Also tell them if you smoke, drink alcohol, or use illegal drugs. Some items may interact with your medicine. °What should I watch for while using this medicine? °Your condition will be monitored carefully while you are receiving this medicine. Visit your doctor for regular check ups. Tell your doctor or healthcare professional if your symptoms do not start to get better or if they get worse. °Stay away from people who are sick. Call your doctor or health care professional for advice if you get a fever, chills or sore throat, or other symptoms of a cold or flu. Do not treat yourself. °In some patients, this medicine may cause a serious brain infection that may cause death. If you have any problems seeing, thinking, speaking, walking, or standing, tell your doctor right away. If you cannot reach your doctor, get urgent medical care. °What side effects may I notice from receiving this medicine? °Side effects that you should report to your doctor or health care professional as soon as possible: °-allergic reactions like skin rash, itching or hives, swelling of the face, lips, or tongue °-breathing problems °-changes in vision °-chest pain °-dark urine °-depression, feelings of sadness °-dizziness °-general ill feeling or flu-like symptoms °-irregular, missed, or painful menstrual periods °-light-colored stools °-loss of appetite, nausea °-muscle weakness °-problems with balance, talking, or walking °-right upper belly pain °-unusually weak or tired °-yellowing of the eyes or skin °Side effects that usually do not require medical attention (Report these to your doctor or health care professional if they continue or are bothersome.): °-aches, pains °-headache °-stomach upset °-tiredness °  This list may not describe all possible side effects. Call your doctor for medical advice about side effects. You may report side  effects to FDA at 1-800-FDA-1088. °Where should I keep my medicine? °This drug is given in a hospital or clinic and will not be stored at home. °NOTE: This sheet is a summary. It may not cover all possible information. If you have questions about this medicine, talk to your doctor, pharmacist, or health care provider. °  °© 2016, [employer]. (2012-07-10 12:32:57) ° °

## 2015-06-27 ENCOUNTER — Ambulatory Visit: Payer: 59 | Admitting: Gastroenterology

## 2015-06-29 ENCOUNTER — Encounter: Payer: Self-pay | Admitting: Gastroenterology

## 2015-07-04 ENCOUNTER — Encounter (HOSPITAL_COMMUNITY)
Admission: RE | Admit: 2015-07-04 | Discharge: 2015-07-04 | Disposition: A | Discharge status: Home / Self Care | Payer: 59 | Source: Ambulatory Visit | Attending: Gastroenterology | Admitting: Gastroenterology

## 2015-07-04 DIAGNOSIS — K50813 Crohn's disease of both small and large intestine with fistula: Secondary | ICD-10-CM | POA: Diagnosis not present

## 2015-07-04 MED ORDER — LORATADINE 10 MG PO TABS: 10.0000 mg | ORAL_TABLET | Freq: Every day | ORAL | Status: DC

## 2015-07-04 MED ORDER — METHYLPREDNISOLONE SODIUM SUCC 40 MG IJ SOLR
40.0000 mg | Freq: Once | INTRAMUSCULAR | Status: AC
  Administered 2015-07-04: 40 mg via INTRAVENOUS
  Filled 2015-07-04: qty 1

## 2015-07-04 MED ORDER — ACETAMINOPHEN 325 MG PO TABS: 650.0000 mg | ORAL_TABLET | Freq: Once | ORAL | Status: DC

## 2015-07-04 MED ORDER — SODIUM CHLORIDE 0.9 % IV SOLN
INTRAVENOUS | Status: DC
  Administered 2015-07-04: 08:00:00 via INTRAVENOUS

## 2015-07-04 MED ORDER — VEDOLIZUMAB 300 MG IV SOLR
300.0000 mg | Freq: Once | INTRAVENOUS | Status: AC
  Administered 2015-07-04: 300 mg via INTRAVENOUS
  Filled 2015-07-04: qty 5

## 2015-07-12 NOTE — Progress Notes (Signed)
REVIEWED-NO ADDITIONAL RECOMMENDATIONS. 

## 2015-07-12 NOTE — Progress Notes (Signed)
Subjective:    Patient ID: [name] [name], female    DOB: 17-Mar-1970, 45 y.o.   MRN: [id number]  [name], MD  HPI GOT CHICKEN POX VACCINE. STARTED ENTYVIO. AFTER FIRST ENTYVIO INFUSION HAD A HEADACHE AND NAUSEA. 2ND ONE: SLIGHTLY NAUSEATED. APPETITE: GOOD. GETS INFUSION AND FEELS SWELLING GET BETTER. MILD ABD CRAMPS: 3X/WEEK. NO NOCTURNAL STOOLS. RARE NIGHT SWEATS. BMs: JUST ABOUT EVERY DAY #4. BEEN GETTING EXERCISE. TAKES ZANTAC BEFORE SHE EATS MOST OF THE TIME. JOINT PAIN(ALL OF THEM) WHEN THE WEATHER IS BAD. KEEPING TORADOL AT A MINIMUM. LAST USE: APR 2017. CUTTING BACK ON THE SMOKING. Been on steroids in the past.  PT DENIES FEVER, CHILLS, HEMATOCHEZIA, nausea, vomiting, melena, diarrhea, CHEST PAIN, SHORTNESS OF BREATH, CHANGE IN BOWEL IN HABITS, constipation, problems swallowing, problems with sedation, OR heartburn or indigestion. NO SORES IN MOUTH, RASH ON LEGS, OR BACK PAIN.   Past Medical History  Diagnosis Date  . LBP (low back pain)     Dr. Mina Marble  . GERD (gastroesophageal reflux disease)   . Anxiety   . Crohn's   . Bartholin cyst 2008    vag.  [name] Perianal abscess 2009  . Vitamin B12 deficiency   . Arthritis     Dr. Eddie Dibbles  . Elevated glucose 2010  . Kidney stone   . BIPOLAR AFFECTIVE DISORDER 04/14/2007  . Depression   . Hypertension    Past Surgical History  Procedure Laterality Date  . Elbow surgery      left  . Rectovaginal fistula closure      did not help  . Colonoscopy with propofol N/A 03/29/2015    [facility]: Severe proctocolitis limitied to cecum and rectum. Limited exam of the colon mucosa and anal canal.   . Esophagogastroduodenoscopy (egd) with propofol N/A 03/29/2015    [facility]: 1. Erosive gastritis duodentitis.   . Colonoscopy  09/2008    Dr. Derrill Kay: ulcers, edema, possible fistula openings all seen in the distal 3 cm of anus/anal canal. 1cm pseudopolyp. rest of colon and TI normal. rectal biopsy with mild chronic active colitis. ileum bx normal.   Allergies    Allergen Reactions  . Cephalexin     itching  . Codeine   . Guaifenesin   . Imitrex [Sumatriptan]     2 fingers got very hot  . Morphine   . 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 Sulfonamide Derivatives   . Ultram [Tramadol Hcl]     [employer] syndrome with either remicade or ultram    Current Outpatient Prescriptions  Medication Sig Dispense Refill  . ALPRAZolam (XANAX) 0.5 MG tablet Take 1 tablet (0.5 mg total) by mouth at bedtime as needed.    [name] LOTENSIN HCT 20-12.5 MG tablet TAKE ONE TABLET BY MOUTH ONCE EVERY DAY    .       VIT B12 Inject 1 mL (1,000 mcg total) into the skin every 14 (fourteen) days.    [name] FLEXERIL 10 MG tablet TAKE ONE HALF TO ONE TABLET PO PRN FOR MUSCLE SPASMS    . NORCO 10-325 MG tablet Take 1 tablet by mouth every 6 (six) hours as needed for severe pain.     . TORADOL 10 MG tablet Take 1 tablet (10 mg total) by mouth every 6 (six) hours as needed. for pain    . lamoTRIgine LAMICTAL 200 MG tablet Take 1 tablet by mouth daily.    . meperidine (DEMEROL) 50  MG tablet Take 1 tablet (50 mg total) by mouth daily as needed. For severe pain    . norethindrone (AYGESTIN) 5 MG tablet TAKE ONE TABLET BY MOUTH ONCE EVERY DAY    . PRILOSEC 20 MG capsule 1 PO 30 mins prior to breakfast and supper    . sertraline (ZOLOFT) 100 MG tablet Take 100 mg by mouth 2 (two) times daily.       Review of Systems PER HPI OTHERWISE ALL SYSTEMS ARE NEGATIVE.    Objective:   Physical Exam  Constitutional: She is oriented to person, place, and time. She appears well-developed and well-nourished. No distress.  HENT:  Head: Normocephalic and atraumatic.  Mouth/Throat: Oropharynx is clear and moist. No oropharyngeal exudate.  Eyes: Pupils are equal, round, and reactive to light. No scleral icterus.  Neck: Normal range of motion. Neck supple.  Cardiovascular: Normal rate, regular rhythm and normal heart sounds.   Pulmonary/Chest:  Effort normal and breath sounds normal. No respiratory distress.  Abdominal: Soft. Bowel sounds are normal. She exhibits no distension. There is tenderness. There is no rebound and no guarding.  MILD LLQ TTP  Musculoskeletal: She exhibits no edema.  Lymphadenopathy:    She has no cervical adenopathy.  Neurological: She is alert and oriented to person, place, and time.  NO FOCAL DEFICITS  Psychiatric: She has a normal mood and affect.  Vitals reviewed.     Assessment & Plan:

## 2015-07-13 ENCOUNTER — Encounter: Payer: Self-pay | Admitting: Gastroenterology

## 2015-07-13 ENCOUNTER — Ambulatory Visit: Payer: 59 | Admitting: Gastroenterology

## 2015-07-13 ENCOUNTER — Ambulatory Visit (INDEPENDENT_AMBULATORY_CARE_PROVIDER_SITE_OTHER): Payer: 59 | Admitting: Gastroenterology

## 2015-07-13 ENCOUNTER — Other Ambulatory Visit: Payer: Self-pay | Admitting: Gastroenterology

## 2015-07-13 VITALS — BP 116/73 | HR 100 | Temp 97.5°F | Ht 62.0 in | Wt 149.2 lb

## 2015-07-13 DIAGNOSIS — Z79899 Other long term (current) drug therapy: Secondary | ICD-10-CM

## 2015-07-13 DIAGNOSIS — K50013 Crohn's disease of small intestine with fistula: Secondary | ICD-10-CM

## 2015-07-13 LAB — COMPLETE METABOLIC PANEL WITH GFR
ALT: 7 U/L (ref 6–29)
AST: 9 U/L — ABNORMAL LOW (ref 10–35)
Albumin: 4.3 g/dL (ref 3.6–5.1)
Alkaline Phosphatase: 65 U/L (ref 33–115)
BUN: 14 mg/dL (ref 7–25)
CO2: 27 mmol/L (ref 20–31)
Calcium: 9.1 mg/dL (ref 8.6–10.2)
Chloride: 103 mmol/L (ref 98–110)
Creat: 0.91 mg/dL (ref 0.50–1.10)
GFR, Est African American: 88 mL/min (ref >=60)
GFR, Est Non African American: 76 mL/min (ref >=60)
Glucose, Bld: 72 mg/dL (ref 65–99)
Potassium: 3.9 mmol/L (ref 3.5–5.3)
Sodium: 139 mmol/L (ref 135–146)
Total Bilirubin: 0.5 mg/dL (ref 0.2–1.2)
Total Protein: 6.7 g/dL (ref 6.1–8.1)

## 2015-07-13 LAB — CBC WITH DIFFERENTIAL/PLATELET
Basophils Absolute: 0 cells/uL (ref 0–200)
Basophils Relative: 0 %
Eosinophils Absolute: 81 cells/uL (ref 15–500)
Eosinophils Relative: 1 %
HCT: 40.9 % (ref 35.0–45.0)
Hemoglobin: 13.9 g/dL (ref 11.7–15.5)
Lymphocytes Relative: 23 %
Lymphs Abs: 1863 cells/uL (ref 850–3900)
MCH: 30.3 pg (ref 27.0–33.0)
MCHC: 34 g/dL (ref 32.0–36.0)
MCV: 89.1 fL (ref 80.0–100.0)
MPV: 9.9 fL (ref 7.5–12.5)
Monocytes Absolute: 405 cells/uL (ref 200–950)
Monocytes Relative: 5 %
Neutro Abs: 5751 cells/uL (ref 1500–7800)
Neutrophils Relative %: 71 %
Platelets: 153 10*3/uL (ref 140–400)
RBC: 4.59 MIL/uL (ref 3.80–5.10)
RDW: 13.6 % (ref 11.0–15.0)
WBC: 8.1 10*3/uL (ref 3.8–10.8)

## 2015-07-13 NOTE — Assessment & Plan Note (Signed)
SYMPTOMS FAIRLY WELL CONTROLLED. NO ADVERSE EFFECTS FROM ENTYVIO. RESPONDING TO MEDS. PMHx: TOBACCO USE AND STEROID USE.  CONTINUE TO MONITOR SYMPTOMS. DEXA SCAN COMPLETE CMP/CBC FOLLOW UP IN 4 MOS.

## 2015-07-13 NOTE — Progress Notes (Signed)
CC'ED TO PCP 

## 2015-07-13 NOTE — Patient Instructions (Addendum)
CONTINUE ENTYVIO.  COMPLETE DEXA SCAN TO LOOK FOR OSTEOPOROSIS.  COMPLETE labs.  FOLLOW UP IN 4 MOS.   PLEASE CALL WITH QUESTIONS OR CONCERNS.

## 2015-07-13 NOTE — Progress Notes (Signed)
ON RECALL  °

## 2015-07-19 ENCOUNTER — Ambulatory Visit (HOSPITAL_COMMUNITY)
Admission: RE | Admit: 2015-07-19 | Discharge: 2015-07-19 | Disposition: A | Discharge status: Home / Self Care | Payer: 59 | Source: Ambulatory Visit | Attending: Gastroenterology | Admitting: Gastroenterology

## 2015-07-19 DIAGNOSIS — Z720 Tobacco use: Secondary | ICD-10-CM | POA: Diagnosis not present

## 2015-07-19 DIAGNOSIS — Z79899 Other long term (current) drug therapy: Secondary | ICD-10-CM | POA: Diagnosis not present

## 2015-07-19 DIAGNOSIS — Z780 Asymptomatic menopausal state: Secondary | ICD-10-CM | POA: Diagnosis not present

## 2015-07-21 ENCOUNTER — Telehealth: Payer: Self-pay | Admitting: Gastroenterology

## 2015-07-21 ENCOUNTER — Other Ambulatory Visit: Payer: Self-pay | Admitting: Gastroenterology

## 2015-07-21 NOTE — Telephone Encounter (Signed)
PT is aware.

## 2015-07-21 NOTE — Telephone Encounter (Signed)
PLEASE CALL PT. HER BLOOD COUNT AND LIVER TESTS ARE NORMAL.

## 2015-07-21 NOTE — Telephone Encounter (Signed)
Pt is aware.  

## 2015-07-21 NOTE — Telephone Encounter (Signed)
PLEASE CALL PT. HER DEXA SCAN IS NORMAL.

## 2015-07-22 MED ORDER — CIPROFLOXACIN HCL 500 MG PO TABS: 500.0000 mg | ORAL_TABLET | Freq: Two times a day (BID) | ORAL | Status: DC

## 2015-08-01 ENCOUNTER — Encounter (HOSPITAL_COMMUNITY)
Admission: RE | Admit: 2015-08-01 | Discharge: 2015-08-01 | Disposition: A | Discharge status: Home / Self Care | Payer: 59 | Source: Ambulatory Visit | Attending: Gastroenterology | Admitting: Gastroenterology

## 2015-08-01 ENCOUNTER — Ambulatory Visit: Payer: 59 | Admitting: Internal Medicine

## 2015-08-01 DIAGNOSIS — K50913 Crohn's disease, unspecified, with fistula: Secondary | ICD-10-CM | POA: Diagnosis present

## 2015-08-01 MED ORDER — METHYLPREDNISOLONE SODIUM SUCC 40 MG IJ SOLR
40.0000 mg | Freq: Once | INTRAMUSCULAR | Status: AC
  Administered 2015-08-01: 40 mg via INTRAVENOUS
  Filled 2015-08-01: qty 1

## 2015-08-01 MED ORDER — VEDOLIZUMAB 300 MG IV SOLR
300.0000 mg | Freq: Once | INTRAVENOUS | Status: AC
  Administered 2015-08-01: 300 mg via INTRAVENOUS
  Filled 2015-08-01: qty 5

## 2015-08-01 MED ORDER — SODIUM CHLORIDE 0.9 % IV SOLN
INTRAVENOUS | Status: DC
  Administered 2015-08-01: 08:00:00 via INTRAVENOUS

## 2015-08-01 MED ORDER — LORATADINE 10 MG PO TABS: 10.0000 mg | ORAL_TABLET | Freq: Once | ORAL | Status: DC

## 2015-08-01 MED ORDER — ACETAMINOPHEN 325 MG PO TABS: 650.0000 mg | ORAL_TABLET | Freq: Once | ORAL | Status: DC

## 2015-08-01 MED ORDER — VEDOLIZUMAB 300 MG IV SOLR: 300.0000 mg | Freq: Once | INTRAVENOUS | Status: DC

## 2015-08-07 ENCOUNTER — Other Ambulatory Visit: Payer: Self-pay | Admitting: Internal Medicine

## 2015-08-16 ENCOUNTER — Ambulatory Visit (INDEPENDENT_AMBULATORY_CARE_PROVIDER_SITE_OTHER): Payer: 59 | Admitting: Internal Medicine

## 2015-08-16 ENCOUNTER — Encounter: Payer: Self-pay | Admitting: Internal Medicine

## 2015-08-16 VITALS — BP 130/80 | HR 101 | Wt 154.0 lb

## 2015-08-16 DIAGNOSIS — E538 Deficiency of other specified B group vitamins: Secondary | ICD-10-CM | POA: Diagnosis not present

## 2015-08-16 DIAGNOSIS — M5440 Lumbago with sciatica, unspecified side: Secondary | ICD-10-CM | POA: Diagnosis not present

## 2015-08-16 DIAGNOSIS — K50119 Crohn's disease of large intestine with unspecified complications: Secondary | ICD-10-CM

## 2015-08-16 DIAGNOSIS — G8929 Other chronic pain: Secondary | ICD-10-CM

## 2015-08-16 DIAGNOSIS — I10 Essential (primary) hypertension: Secondary | ICD-10-CM

## 2015-08-16 DIAGNOSIS — F411 Generalized anxiety disorder: Secondary | ICD-10-CM

## 2015-08-16 MED ORDER — HYDROCODONE-ACETAMINOPHEN 10-325 MG PO TABS: 1.0000 | ORAL_TABLET | Freq: Four times a day (QID) | ORAL | Status: DC | PRN

## 2015-08-16 MED ORDER — KETOROLAC TROMETHAMINE 10 MG PO TABS: 10.0000 mg | ORAL_TABLET | Freq: Four times a day (QID) | ORAL | Status: DC | PRN

## 2015-08-16 MED ORDER — CYCLOBENZAPRINE HCL 10 MG PO TABS: ORAL_TABLET | ORAL | Status: DC

## 2015-08-16 NOTE — Progress Notes (Signed)
Pre visit review using our clinic review tool, if applicable. No additional management support is needed unless otherwise documented below in the visit note. 

## 2015-08-16 NOTE — Assessment & Plan Note (Signed)
  H/o recurrent fistula Demerol po rare for severe pain   Potential benefits of a long term opioids use as well as potential risks (i.e. addiction risk, apnea etc) and complications (i.e. Somnolence, constipation and others) were explained to the patient and were aknowledged.

## 2015-08-16 NOTE — Assessment & Plan Note (Signed)
Norco; Toradol, Demerol prn   Potential benefits of a long term opioids use as well as potential risks (i.e. addiction risk, apnea etc) and complications (i.e. Somnolence, constipation and others) were explained to the patient and were aknowledged.

## 2015-08-16 NOTE — Assessment & Plan Note (Signed)
Xanax prn  Potential benefits of a long term benzodiazepines  use as well as potential risks  and complications were explained to the patient and were aknowledged. Do not take w/pain meds.

## 2015-08-16 NOTE — Assessment & Plan Note (Signed)
Lotensin HCT

## 2015-08-16 NOTE — Progress Notes (Signed)
Subjective:  Patient ID: [name] [name], female    DOB: 06-07-1970  Age: 45 y.o. MRN: [id number]  CC: No chief complaint on file.   HPI CHIEKO PROSS presents for LBP, anxiety, HTN, colitis f/u  Outpatient Prescriptions Prior to Visit  Medication Sig Dispense Refill  . ALPRAZolam (XANAX) 0.5 MG tablet Take 1 tablet (0.5 mg total) by mouth at bedtime as needed. (Patient taking differently: Take 0.5 mg by mouth 4 (four) times daily. ) 30 tablet 2  . benazepril-hydrochlorthiazide (LOTENSIN HCT) 20-12.5 MG tablet TAKE ONE TABLET BY MOUTH ONCE EVERY DAY 30 tablet 2  . ciprofloxacin (CIPRO) 500 MG tablet Take 1 tablet (500 mg total) by mouth 2 (two) times daily. 20 tablet 0  . cyanocobalamin (,VITAMIN B-12,) 1000 MCG/ML injection Inject 1 mL (1,000 mcg total) into the skin every 14 (fourteen) days. 10 mL 3  . lamoTRIgine (LAMICTAL) 200 MG tablet Take 1 tablet by mouth daily.    . meperidine (DEMEROL) 50 MG tablet Take 1 tablet (50 mg total) by mouth daily as needed. For sever pain 20 tablet 0  . norethindrone (AYGESTIN) 5 MG tablet TAKE ONE TABLET BY MOUTH ONCE EVERY DAY 30 tablet 10  . omeprazole (PRILOSEC) 20 MG capsule 1 PO 30 mins prior to breakfast and supper 60 capsule 11  . sertraline (ZOLOFT) 100 MG tablet Take 100 mg by mouth 2 (two) times daily.      . sodium chloride 0.9 % SOLN 250 mL with vedolizumab 300 MG SOLR 300 mg Inject 300 mg into the vein. LOADING DOSE THEN Q8 WEEKS    . cyclobenzaprine (FLEXERIL) 10 MG tablet TAKE ONE HALF TO ONE TABLET BY MOUTH EVERY DAY AS NEEDED FOR MUSCLE SPASMS 30 tablet 1  . HYDROcodone-acetaminophen (NORCO) 10-325 MG tablet Take 1 tablet by mouth every 6 (six) hours as needed for severe pain. Please fill on or after 07/23/15 120 tablet 0  . ketorolac (TORADOL) 10 MG tablet Take 1 tablet (10 mg total) by mouth every 6 (six) hours as needed. for pain 20 tablet 1   No facility-administered medications prior to visit.    ROS Review of Systems    Constitutional: Negative for chills, activity change, appetite change, fatigue and unexpected weight change.  HENT: Negative for congestion, mouth sores and sinus pressure.   Eyes: Negative for visual disturbance.  Respiratory: Negative for cough, chest tightness and shortness of breath.   Gastrointestinal: Positive for diarrhea. Negative for nausea and abdominal pain.  Genitourinary: Negative for frequency, difficulty urinating and vaginal pain.  Musculoskeletal: Positive for back pain. Negative for gait problem.  Skin: Negative for pallor and rash.  Neurological: Negative for dizziness, tremors, weakness, numbness and headaches.  Psychiatric/Behavioral: Negative for confusion and sleep disturbance.    Objective:  BP 130/80 mmHg  Pulse 101  Wt 154 lb (69.854 kg)  SpO2 98%  BP Readings from Last 3 Encounters:  08/16/15 130/80  08/01/15 107/59  07/13/15 116/73    Wt Readings from Last 3 Encounters:  08/16/15 154 lb (69.854 kg)  08/01/15 149 lb (67.586 kg)  07/13/15 149 lb 3.2 oz (67.677 kg)    Physical Exam  Constitutional: She appears well-developed. No distress.  HENT:  Head: Normocephalic.  Right Ear: External ear normal.  Left Ear: External ear normal.  Nose: Nose normal.  Mouth/Throat: Oropharynx is clear and moist.  Eyes: Conjunctivae are normal. Pupils are equal, round, and reactive to light. Right eye exhibits no discharge. Left eye exhibits no  discharge.  Neck: Normal range of motion. Neck supple. No JVD present. No tracheal deviation present. No thyromegaly present.  Cardiovascular: Normal rate, regular rhythm and normal heart sounds.   Pulmonary/Chest: No stridor. No respiratory distress. She has no wheezes.  Abdominal: Soft. Bowel sounds are normal. She exhibits no distension and no mass. There is no tenderness. There is no rebound and no guarding.  Musculoskeletal: She exhibits tenderness. She exhibits no edema.  Lymphadenopathy:    She has no cervical  adenopathy.  Neurological: She displays normal reflexes. No cranial nerve deficit. She exhibits normal muscle tone. Coordination normal.  Skin: No rash noted. No erythema.  Psychiatric: She has a normal mood and affect. Her behavior is normal. Judgment and thought content normal.  LS is tender  Lab Results  Component Value Date   WBC 8.1 07/13/2015   HGB 13.9 07/13/2015   HCT 40.9 07/13/2015   PLT 153 07/13/2015   GLUCOSE 72 07/13/2015   CHOL 168 08/30/2014   TRIG 99.0 08/30/2014   HDL 27.40* 08/30/2014   LDLDIRECT 171.9 05/01/2011   LDLCALC 121* 08/30/2014   ALT 7 07/13/2015   AST 9* 07/13/2015   NA 139 07/13/2015   K 3.9 07/13/2015   CL 103 07/13/2015   CREATININE 0.91 07/13/2015   BUN 14 07/13/2015   CO2 27 07/13/2015   TSH 1.83 08/30/2014    No results found.  Assessment & Plan:   There are no diagnoses linked to this encounter. I have discontinued Ms. Hendryx's HYDROcodone-acetaminophen and HYDROcodone-acetaminophen. I have also changed her HYDROcodone-acetaminophen. Additionally, I am having her maintain her sertraline, ALPRAZolam, norethindrone, lamoTRIgine, omeprazole, cyanocobalamin, meperidine, benazepril-hydrochlorthiazide, sodium chloride 0.9 % SOLN 250 mL with vedolizumab 300 MG SOLR 300 mg, ciprofloxacin, ketorolac, and cyclobenzaprine.  Meds ordered this encounter  Medications  . ketorolac (TORADOL) 10 MG tablet    Sig: Take 1 tablet (10 mg total) by mouth every 6 (six) hours as needed. for pain    Dispense:  20 tablet    Refill:  1  . DISCONTD: HYDROcodone-acetaminophen (NORCO) 10-325 MG tablet    Sig: Take 1 tablet by mouth every 6 (six) hours as needed for severe pain. Please fill on or after 08/22/15    Dispense:  120 tablet    Refill:  0  . cyclobenzaprine (FLEXERIL) 10 MG tablet    Sig: TAKE ONE HALF TO ONE TABLET BY MOUTH EVERY DAY AS NEEDED FOR MUSCLE SPASMS    Dispense:  30 tablet    Refill:  1  . DISCONTD: HYDROcodone-acetaminophen (NORCO) 10-325  MG tablet    Sig: Take 1 tablet by mouth every 6 (six) hours as needed for severe pain. Please fill on or after 09/22/15    Dispense:  120 tablet    Refill:  0  . HYDROcodone-acetaminophen (NORCO) 10-325 MG tablet    Sig: Take 1 tablet by mouth every 6 (six) hours as needed for severe pain. Please fill on or after 10/23/15    Dispense:  120 tablet    Refill:  0     Follow-up: Return in about 3 months (around 11/16/2015) for a follow-up visit.  [name], MD

## 2015-08-16 NOTE — Assessment & Plan Note (Signed)
On B12 

## 2015-08-22 ENCOUNTER — Ambulatory Visit: Payer: 59 | Admitting: Internal Medicine

## 2015-08-30 ENCOUNTER — Ambulatory Visit: Payer: 59 | Admitting: Internal Medicine

## 2015-09-01 ENCOUNTER — Telehealth: Payer: Self-pay | Admitting: Gastroenterology

## 2015-09-01 ENCOUNTER — Other Ambulatory Visit: Payer: Self-pay | Admitting: Internal Medicine

## 2015-09-01 NOTE — Telephone Encounter (Signed)
She will need to contact her PCP for psoriasis medication.

## 2015-09-05 ENCOUNTER — Telehealth: Payer: Self-pay | Admitting: *Deleted

## 2015-09-05 MED ORDER — CLOBETASOL PROPIONATE 0.05 % EX LIQD: Freq: Two times a day (BID) | CUTANEOUS | Status: DC

## 2015-09-05 NOTE — Telephone Encounter (Signed)
Sent her an e-mail in her my chart.

## 2015-09-05 NOTE — Telephone Encounter (Signed)
Done. Thx.

## 2015-09-05 NOTE — Telephone Encounter (Signed)
Left msg on triage stating f/u on email that was sent to MD last Thursday. Attach email below.../lmb   [name]   To  Cassandria Anger, MD   Sent  09/01/2015 11:09 AM      Original authorizing provider: [name], MD   [name] would like a refill of the following medications:  meperidine (DEMEROL) 50 MG tablet [Alex Plotnikov, MD]   Preferred pharmacy: [id number] - [place], [place] - [address]   Comment:  I do NOT need Demerol  I was wondering if you would call me in Clobetasol Propionate non-aerosol Spray  for psoriasis of the scalp. I have used before some type back from my dermatologist.  I have recently used head and shoulders, Dermarest, T-Gel and even tar. I am tired of itching and scratching!!  Thanks  [name]

## 2015-09-14 ENCOUNTER — Encounter: Payer: Self-pay | Admitting: Internal Medicine

## 2015-09-19 ENCOUNTER — Other Ambulatory Visit: Payer: Self-pay | Admitting: Internal Medicine

## 2015-09-21 ENCOUNTER — Encounter: Payer: Self-pay | Admitting: Gastroenterology

## 2015-09-26 ENCOUNTER — Encounter (HOSPITAL_COMMUNITY)
Admission: RE | Admit: 2015-09-26 | Discharge: 2015-09-26 | Disposition: A | Discharge status: Home / Self Care | Payer: 59 | Source: Ambulatory Visit | Attending: Gastroenterology | Admitting: Gastroenterology

## 2015-09-26 DIAGNOSIS — K50813 Crohn's disease of both small and large intestine with fistula: Secondary | ICD-10-CM | POA: Diagnosis not present

## 2015-09-26 DIAGNOSIS — F3181 Bipolar II disorder: Secondary | ICD-10-CM | POA: Diagnosis not present

## 2015-09-26 MED ORDER — SODIUM CHLORIDE 0.9 % IV SOLN
INTRAVENOUS | Status: DC
  Administered 2015-09-26: 250 mL via INTRAVENOUS

## 2015-09-26 MED ORDER — METHYLPREDNISOLONE SODIUM SUCC 40 MG IJ SOLR
40.0000 mg | Freq: Once | INTRAMUSCULAR | Status: AC
  Administered 2015-09-26: 40 mg via INTRAVENOUS
  Filled 2015-09-26: qty 1

## 2015-09-26 MED ORDER — VEDOLIZUMAB 300 MG IV SOLR
300.0000 mg | Freq: Once | INTRAVENOUS | Status: AC
  Administered 2015-09-26: 300 mg via INTRAVENOUS
  Filled 2015-09-26: qty 5

## 2015-09-26 NOTE — Progress Notes (Signed)
Pt states took tylenol and claritin at home before coming to hospital. 

## 2015-10-03 DIAGNOSIS — Z23 Encounter for immunization: Secondary | ICD-10-CM | POA: Diagnosis not present

## 2015-10-20 ENCOUNTER — Other Ambulatory Visit: Payer: Self-pay | Admitting: Internal Medicine

## 2015-10-20 DIAGNOSIS — K612 Anorectal abscess: Secondary | ICD-10-CM

## 2015-10-20 NOTE — Telephone Encounter (Signed)
Please advise ok to Rf in Demerol 50 mg and Cipro 500 mg for her Crohn's. Please advise in PCP's absence. Thanks!

## 2015-10-22 ENCOUNTER — Encounter: Payer: Self-pay | Admitting: Internal Medicine

## 2015-10-25 ENCOUNTER — Other Ambulatory Visit: Payer: Self-pay | Admitting: Internal Medicine

## 2015-10-25 MED ORDER — CIPROFLOXACIN HCL 500 MG PO TABS: 500.0000 mg | ORAL_TABLET | Freq: Two times a day (BID) | ORAL | 0 refills | Status: DC

## 2015-11-21 ENCOUNTER — Encounter (HOSPITAL_COMMUNITY): Payer: Self-pay

## 2015-11-21 ENCOUNTER — Ambulatory Visit: Payer: 59 | Admitting: Internal Medicine

## 2015-11-21 ENCOUNTER — Encounter (HOSPITAL_COMMUNITY)
Admission: RE | Admit: 2015-11-21 | Discharge: 2015-11-21 | Disposition: A | Discharge status: Home / Self Care | Payer: 59 | Source: Ambulatory Visit | Attending: Gastroenterology | Admitting: Gastroenterology

## 2015-11-21 DIAGNOSIS — K5000 Crohn's disease of small intestine without complications: Secondary | ICD-10-CM | POA: Diagnosis present

## 2015-11-21 MED ORDER — VEDOLIZUMAB 300 MG IV SOLR
300.0000 mg | Freq: Once | INTRAVENOUS | Status: AC
  Administered 2015-11-21: 300 mg via INTRAVENOUS
  Filled 2015-11-21: qty 5

## 2015-11-21 MED ORDER — METHYLPREDNISOLONE SODIUM SUCC 40 MG IJ SOLR
40.0000 mg | Freq: Once | INTRAMUSCULAR | Status: AC
  Administered 2015-11-21: 40 mg via INTRAVENOUS
  Filled 2015-11-21: qty 1

## 2015-11-21 MED ORDER — SODIUM CHLORIDE 0.9 % IV SOLN
INTRAVENOUS | Status: DC
  Administered 2015-11-21: 08:00:00 via INTRAVENOUS

## 2015-11-22 ENCOUNTER — Ambulatory Visit: Payer: 59 | Admitting: Internal Medicine

## 2015-11-24 ENCOUNTER — Encounter: Payer: Self-pay | Admitting: Internal Medicine

## 2015-11-24 ENCOUNTER — Ambulatory Visit (INDEPENDENT_AMBULATORY_CARE_PROVIDER_SITE_OTHER): Payer: 59 | Admitting: Internal Medicine

## 2015-11-24 DIAGNOSIS — E538 Deficiency of other specified B group vitamins: Secondary | ICD-10-CM | POA: Diagnosis not present

## 2015-11-24 DIAGNOSIS — K612 Anorectal abscess: Secondary | ICD-10-CM

## 2015-11-24 DIAGNOSIS — F32A Depression, unspecified: Secondary | ICD-10-CM

## 2015-11-24 DIAGNOSIS — F329 Major depressive disorder, single episode, unspecified: Secondary | ICD-10-CM

## 2015-11-24 DIAGNOSIS — K50013 Crohn's disease of small intestine with fistula: Secondary | ICD-10-CM | POA: Diagnosis not present

## 2015-11-24 DIAGNOSIS — G43109 Migraine with aura, not intractable, without status migrainosus: Secondary | ICD-10-CM | POA: Diagnosis not present

## 2015-11-24 MED ORDER — HYDROCODONE-ACETAMINOPHEN 10-325 MG PO TABS: 1.0000 | ORAL_TABLET | Freq: Four times a day (QID) | ORAL | 0 refills | Status: DC | PRN

## 2015-11-24 MED ORDER — KETOROLAC TROMETHAMINE 10 MG PO TABS: 10.0000 mg | ORAL_TABLET | Freq: Four times a day (QID) | ORAL | 1 refills | Status: DC | PRN

## 2015-11-24 MED ORDER — MEPERIDINE HCL 50 MG PO TABS: 50.0000 mg | ORAL_TABLET | Freq: Every day | ORAL | 0 refills | Status: DC | PRN

## 2015-11-24 NOTE — Assessment & Plan Note (Signed)
Doing well 

## 2015-11-24 NOTE — Progress Notes (Signed)
Subjective:  Patient ID: [name] [name], female    DOB: Jan 11, 1971  Age: 45 y.o. MRN: [id number]  CC: No chief complaint on file.   HPI [name] [name] presents for LBP, anxiety, Crohn's disease - IV infusions q 8 weeks   Outpatient Medications Prior to Visit  Medication Sig Dispense Refill  . ALPRAZolam (XANAX) 0.5 MG tablet Take 1 tablet (0.5 mg total) by mouth at bedtime as needed. (Patient taking differently: Take 0.5 mg by mouth 4 (four) times daily. ) 30 tablet 2  . benazepril-hydrochlorthiazide (LOTENSIN HCT) 20-12.5 MG tablet TAKE ONE TABLET BY MOUTH ONCE EVERY DAY 30 tablet 11  . ciprofloxacin (CIPRO) 500 MG tablet Take 1 tablet (500 mg total) by mouth 2 (two) times daily. 20 tablet 0  . Clobetasol Propionate (TEMOVATE) 0.05 % external spray Apply topically 2 (two) times daily. On scalp 59 mL 1  . cyanocobalamin (,VITAMIN B-12,) 1000 MCG/ML injection Inject 1 mL (1,000 mcg total) into the skin every 14 (fourteen) days. 10 mL 3  . cyclobenzaprine (FLEXERIL) 10 MG tablet TAKE ONE HALF TO ONE TABLET BY MOUTH EVERY DAY AS NEEDED FOR MUSCLE SPASMS 30 tablet 1  . HYDROcodone-acetaminophen (NORCO) 10-325 MG tablet Take 1 tablet by mouth every 6 (six) hours as needed for severe pain. Please fill on or after 10/23/15 120 tablet 0  . ketorolac (TORADOL) 10 MG tablet Take 1 tablet (10 mg total) by mouth every 6 (six) hours as needed. for pain 20 tablet 1  . lamoTRIgine (LAMICTAL) 200 MG tablet Take 1 tablet by mouth daily.    . meperidine (DEMEROL) 50 MG tablet Take 1 tablet (50 mg total) by mouth daily as needed. For sever pain 20 tablet 0  . norethindrone (AYGESTIN) 5 MG tablet TAKE ONE TABLET BY MOUTH ONCE EVERY DAY 30 tablet 10  . omeprazole (PRILOSEC) 20 MG capsule 1 PO 30 mins prior to breakfast and supper 60 capsule 11  . sertraline (ZOLOFT) 100 MG tablet Take 100 mg by mouth 2 (two) times daily.      . sodium chloride 0.9 % SOLN 250 mL with vedolizumab 300 MG SOLR 300 mg Inject 300 mg  into the vein. LOADING DOSE THEN Q8 WEEKS     No facility-administered medications prior to visit.     ROS Review of Systems  Constitutional: Positive for fatigue. Negative for activity change, appetite change, chills and unexpected weight change.  HENT: Negative for congestion, mouth sores and sinus pressure.   Eyes: Negative for visual disturbance.  Respiratory: Negative for cough and chest tightness.   Gastrointestinal: Positive for blood in stool. Negative for abdominal pain, diarrhea and nausea.  Genitourinary: Negative for difficulty urinating, frequency and vaginal pain.  Musculoskeletal: Positive for arthralgias and back pain. Negative for gait problem.  Skin: Negative for pallor and rash.  Neurological: Negative for dizziness, tremors, weakness, numbness and headaches.  Psychiatric/Behavioral: Negative for confusion and sleep disturbance. The patient is nervous/anxious.     Objective:  BP 130/88   Pulse 89   Temp 98.4 F (36.9 C) (Oral)   Wt 160 lb (72.6 kg)   SpO2 98%   BMI 29.26 kg/m   BP Readings from Last 3 Encounters:  11/24/15 130/88  11/21/15 128/68  09/26/15 115/74    Wt Readings from Last 3 Encounters:  11/24/15 160 lb (72.6 kg)  11/21/15 152 lb (68.9 kg)  08/16/15 154 lb (69.9 kg)    Physical Exam  Constitutional: She appears well-developed. No distress.  HENT:  Head: Normocephalic.  Right Ear: External ear normal.  Left Ear: External ear normal.  Nose: Nose normal.  Mouth/Throat: Oropharynx is clear and moist.  Eyes: Conjunctivae are normal. Pupils are equal, round, and reactive to light. Right eye exhibits no discharge. Left eye exhibits no discharge.  Neck: Normal range of motion. Neck supple. No JVD present. No tracheal deviation present. No thyromegaly present.  Cardiovascular: Normal rate, regular rhythm and normal heart sounds.   Pulmonary/Chest: No stridor. No respiratory distress. She has no wheezes.  Abdominal: Soft. Bowel sounds are  normal. She exhibits no distension and no mass. There is no tenderness. There is no rebound and no guarding.  Musculoskeletal: She exhibits tenderness. She exhibits no edema.  Lymphadenopathy:    She has no cervical adenopathy.  Neurological: She displays normal reflexes. No cranial nerve deficit. She exhibits normal muscle tone. Coordination normal.  Skin: No rash noted. No erythema.  Psychiatric: She has a normal mood and affect. Her behavior is normal. Judgment and thought content normal.  LS tender  Lab Results  Component Value Date   WBC 8.1 07/13/2015   HGB 13.9 07/13/2015   HCT 40.9 07/13/2015   PLT 153 07/13/2015   GLUCOSE 72 07/13/2015   CHOL 168 08/30/2014   TRIG 99.0 08/30/2014   HDL 27.40 (L) 08/30/2014   LDLDIRECT 171.9 05/01/2011   LDLCALC 121 (H) 08/30/2014   ALT 7 07/13/2015   AST 9 (L) 07/13/2015   NA 139 07/13/2015   K 3.9 07/13/2015   CL 103 07/13/2015   CREATININE 0.91 07/13/2015   BUN 14 07/13/2015   CO2 27 07/13/2015   TSH 1.83 08/30/2014    No results found.  Assessment & Plan:   Diagnoses and all orders for this visit:  Abscess of anal and rectal regions -     meperidine (DEMEROL) 50 MG tablet; Take 1 tablet (50 mg total) by mouth daily as needed. For sever pain  Other orders -     HYDROcodone-acetaminophen (NORCO) 10-325 MG tablet; Take 1 tablet by mouth every 6 (six) hours as needed for severe pain. Please fill on or after 10/23/15 -     ketorolac (TORADOL) 10 MG tablet; Take 1 tablet (10 mg total) by mouth every 6 (six) hours as needed. for pain   I am having [name] maintain her sertraline, ALPRAZolam, norethindrone, lamoTRIgine, omeprazole, cyanocobalamin, meperidine, sodium chloride 0.9 % SOLN 250 mL with vedolizumab 300 MG SOLR 300 mg, ketorolac, cyclobenzaprine, HYDROcodone-acetaminophen, Clobetasol Propionate, benazepril-hydrochlorthiazide, and ciprofloxacin.  No orders of the defined types were placed in this  encounter.    Follow-up: No Follow-up on file.  [name], MD

## 2015-11-24 NOTE — Assessment & Plan Note (Signed)
On Entyvio IV q 2 mo

## 2015-11-24 NOTE — Assessment & Plan Note (Signed)
On B12 

## 2015-11-24 NOTE — Assessment & Plan Note (Signed)
On Rx: Norco; Toradol, Demerol prn Chronic and severe On disabiity  Potential benefits of a long term opioids use as well as potential risks (i.e. addiction risk, apnea etc) and complications (i.e. Somnolence, constipation and others) were explained to the patient and were aknowledged. 

## 2015-11-24 NOTE — Progress Notes (Signed)
Pre visit review using our clinic review tool, if applicable. No additional management support is needed unless otherwise documented below in the visit note. 

## 2015-11-24 NOTE — Assessment & Plan Note (Signed)
On Zoloft

## 2015-11-25 ENCOUNTER — Encounter: Payer: Self-pay | Admitting: Internal Medicine

## 2015-11-30 ENCOUNTER — Ambulatory Visit: Payer: 59 | Admitting: Internal Medicine

## 2015-12-11 ENCOUNTER — Other Ambulatory Visit: Payer: Self-pay | Admitting: Internal Medicine

## 2015-12-19 ENCOUNTER — Encounter: Payer: Self-pay | Admitting: Obstetrics and Gynecology

## 2015-12-19 ENCOUNTER — Ambulatory Visit (INDEPENDENT_AMBULATORY_CARE_PROVIDER_SITE_OTHER): Payer: 59 | Admitting: Obstetrics and Gynecology

## 2015-12-19 VITALS — BP 130/80 | HR 80 | Ht 62.0 in | Wt 161.6 lb

## 2015-12-19 DIAGNOSIS — N938 Other specified abnormal uterine and vaginal bleeding: Secondary | ICD-10-CM

## 2015-12-19 DIAGNOSIS — N939 Abnormal uterine and vaginal bleeding, unspecified: Secondary | ICD-10-CM

## 2015-12-19 DIAGNOSIS — Z01419 Encounter for gynecological examination (general) (routine) without abnormal findings: Secondary | ICD-10-CM

## 2015-12-19 MED ORDER — NORETHINDRONE ACETATE 5 MG PO TABS: 5.0000 mg | ORAL_TABLET | Freq: Every day | ORAL | 11 refills | Status: DC

## 2015-12-19 MED ORDER — METRONIDAZOLE 0.75 % VA GEL: 1.0000 | Freq: Every day | VAGINAL | 1 refills | Status: DC

## 2015-12-19 MED ORDER — CIPROFLOXACIN HCL 500 MG PO TABS: 500.0000 mg | ORAL_TABLET | Freq: Two times a day (BID) | ORAL | 1 refills | Status: DC

## 2015-12-19 NOTE — Progress Notes (Signed)
Assessment:  Annual Gyn Exam  Hx AUB 2016, resolved, cont5rolled on Norethindrone Active fistula at 3 oclock massaged, Rx Cipro Pprophylactic Metrogel Rx for pt  Due to hx urethritis from Crohns irritationl.  Plan:  1. pap smear NOT done, next pap due 2019 2. return annually  Due to Crohns and recurrent perineal infections and vaginitisor prn 3    Annual mammogram advised Subjective:  [name] [name] is a 45 y.o. female No obstetric history on file. who presents for annual exam. No LMP recorded. Patient is not currently having periods (Reason: Oral contraceptives). The patient has complaints today of tenderness of perineum mainly on left. Pap review, : pt has normal pap with + HPV in 2014, normal paps in 2015 and 2016 The following portions of the patient's history were reviewed and updated as appropriate: allergies, current medications, past family history, past medical history, past social history, past surgical history and problem list. Past Medical History:  Diagnosis Date  . Anxiety   . Arthritis    Dr. Eddie Dibbles  . Bartholin cyst 2008   vag.  [name] BIPOLAR AFFECTIVE DISORDER 04/14/2007  . Crohn's 2000  . Depression   . Elevated glucose 2010  . GERD (gastroesophageal reflux disease)   . Hypertension   . Kidney stone   . LBP (low back pain)    Dr. Mina Marble  . Perianal abscess 2009  . Vitamin B12 deficiency     Past Surgical History:  Procedure Laterality Date  . COLONOSCOPY  09/2008   Dr. Derrill Kay: ulcers, edema, possible fistula openings all seen in the distal 3 cm of anus/anal canal. 1cm pseudopolyp. rest of colon and TI normal. rectal biopsy with mild chronic active colitis. ileum bx normal.  . COLONOSCOPY WITH PROPOFOL N/A 03/29/2015   SLF: Severe proctocolitis limitied to cecum and rectum. Limited exam of the colon mucosa and anal canal.   . ELBOW SURGERY     left  . ESOPHAGOGASTRODUODENOSCOPY (EGD) WITH PROPOFOL N/A 03/29/2015   SLF: 1. Erosive gastritis duodentitis.   [name] RECTOVAGINAL  FISTULA CLOSURE     did not help     Current Outpatient Prescriptions:  .  ALPRAZolam (XANAX) 0.5 MG tablet, Take 1 tablet (0.5 mg total) by mouth at bedtime as needed. (Patient taking differently: Take 0.5 mg by mouth 4 (four) times daily. ), Disp: 30 tablet, Rfl: 2 .  benazepril-hydrochlorthiazide (LOTENSIN HCT) 20-12.5 MG tablet, TAKE ONE TABLET BY MOUTH ONCE EVERY DAY, Disp: 30 tablet, Rfl: 11 .  ciprofloxacin (CIPRO) 500 MG tablet, Take 1 tablet (500 mg total) by mouth 2 (two) times daily., Disp: 20 tablet, Rfl: 0 .  Clobetasol Propionate (TEMOVATE) 0.05 % external spray, Apply topically 2 (two) times daily. On scalp, Disp: 59 mL, Rfl: 1 .  cyanocobalamin (,VITAMIN B-12,) 1000 MCG/ML injection, Inject 1 mL (1,000 mcg total) into the skin every 14 (fourteen) days., Disp: 10 mL, Rfl: 3 .  cyclobenzaprine (FLEXERIL) 10 MG tablet, TAKE ONE HALF TO ONE TABLET BY MOUTH EVERY DAY AS NEEDED FOR MUSCLE SPASMS, Disp: 30 tablet, Rfl: 1 .  HYDROcodone-acetaminophen (NORCO) 10-325 MG tablet, Take 1 tablet by mouth every 6 (six) hours as needed for severe pain. Please fill on or after 01/22/16, Disp: 120 tablet, Rfl: 0 .  ketorolac (TORADOL) 10 MG tablet, Take 1 tablet (10 mg total) by mouth every 6 (six) hours as needed. for pain, Disp: 20 tablet, Rfl: 1 .  lamoTRIgine (LAMICTAL) 200 MG tablet, Take 1 tablet by mouth daily., Disp: ,  Rfl:  .  meperidine (DEMEROL) 50 MG tablet, Take 1 tablet (50 mg total) by mouth daily as needed. For sever pain, Disp: 20 tablet, Rfl: 0 .  norethindrone (AYGESTIN) 5 MG tablet, TAKE ONE TABLET BY MOUTH ONCE EVERY DAY, Disp: 30 tablet, Rfl: 10 .  omeprazole (PRILOSEC) 20 MG capsule, 1 PO 30 mins prior to breakfast and supper, Disp: 60 capsule, Rfl: 11 .  sertraline (ZOLOFT) 100 MG tablet, Take 100 mg by mouth 2 (two) times daily.  , Disp: , Rfl:  .  sodium chloride 0.9 % SOLN 250 mL with vedolizumab 300 MG SOLR 300 mg, Inject 300 mg into the vein. LOADING DOSE THEN Q8 WEEKS,  Disp: , Rfl:   Review of Systems Constitutional: negative Gastrointestinal: negative, crohns recurrent flareup, recent colonoscopy with Dr Oneida Alar who ws reluctant to remove the prolapsing internal hemorrhoid due to the uncertain healing of the pt. Genitourinary:   Objective:  BP 130/80 (BP Location: Right Arm, Patient Position: Sitting, Cuff Size: Normal)   Pulse 80   Ht 5' 2"  (1.575 m)   Wt 161 lb 9.6 oz (73.3 kg)   BMI 29.56 kg/m    BMI: Body mass index is 29.56 kg/m.  General Appearance: Alert, appropriate appearance for age. 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 Vaginal: normal mucosa without prolapse or lesions Cervix: normal appearance Adnexa: normal bimanual exam Uterus: absent Rectal: refused Exam limited by pain Rectovaginal: not indicated Lymphatic Exam: Non-palpable nodes in neck, clavicular, axillary, or inguinal regions Skin: no rash or abnormalities Neurologic: Normal gait and speech, no tremor  Psychiatric: Alert and oriented, appropriate affect.  Urinalysis:Not done  Mallory Shirk. MD Pgr [phone] 9:00 AM

## 2015-12-29 ENCOUNTER — Other Ambulatory Visit: Payer: Self-pay | Admitting: *Deleted

## 2016-01-03 MED ORDER — NORETHINDRONE ACETATE 5 MG PO TABS: 5.0000 mg | ORAL_TABLET | Freq: Every day | ORAL | 3 refills | Status: DC

## 2016-01-06 ENCOUNTER — Telehealth: Payer: Self-pay | Admitting: Gastroenterology

## 2016-01-06 NOTE — Telephone Encounter (Signed)
LAST OPV MAY 2017. WAS SUPPOSE TO BE SEEN SEP 2017. CMP/CBC/VIT D 1, 25, & TOTAL HEP A Ab PRIOR TO NEXT INFUSION. NEEDS APPT IN DEC 2017 E30 DX: CROHN'S DISEASE.

## 2016-01-07 ENCOUNTER — Other Ambulatory Visit: Payer: Self-pay | Admitting: Internal Medicine

## 2016-01-09 ENCOUNTER — Encounter: Payer: Self-pay | Admitting: Gastroenterology

## 2016-01-09 NOTE — Telephone Encounter (Signed)
APPOINTMENT MADE AND LETTER SENT °

## 2016-01-11 ENCOUNTER — Telehealth: Payer: Self-pay

## 2016-01-11 NOTE — Telephone Encounter (Signed)
T/C from [facility] at the hospital, saying the pt is scheduled for Entyvio on Monday, 01/16/2016.  She was just checking to see if her med was supposed to come from an outside source.  I spoke to Burnadette Peter, LPN and she said the only time that the med comes from outside is when the pt has assistance and this pt does not.  Margreta Journey is now aware of this.

## 2016-01-16 ENCOUNTER — Encounter (HOSPITAL_COMMUNITY)
Admission: RE | Admit: 2016-01-16 | Discharge: 2016-01-16 | Disposition: A | Discharge status: Home / Self Care | Payer: 59 | Source: Ambulatory Visit | Attending: Gastroenterology | Admitting: Gastroenterology

## 2016-01-16 DIAGNOSIS — N321 Vesicointestinal fistula: Secondary | ICD-10-CM | POA: Insufficient documentation

## 2016-01-16 DIAGNOSIS — K5000 Crohn's disease of small intestine without complications: Secondary | ICD-10-CM | POA: Diagnosis present

## 2016-01-16 LAB — CBC
HCT: 42.8 % (ref 36.0–46.0)
Hemoglobin: 14.6 g/dL (ref 12.0–15.0)
MCH: 30.5 pg (ref 26.0–34.0)
MCHC: 34.1 g/dL (ref 30.0–36.0)
MCV: 89.5 fL (ref 78.0–100.0)
Platelets: 124 10*3/uL — ABNORMAL LOW (ref 150–400)
RBC: 4.78 MIL/uL (ref 3.87–5.11)
RDW: 13.3 % (ref 11.5–15.5)
WBC: 8.4 10*3/uL (ref 4.0–10.5)

## 2016-01-16 LAB — COMPREHENSIVE METABOLIC PANEL
ALT: 11 U/L — ABNORMAL LOW (ref 14–54)
AST: 12 U/L — ABNORMAL LOW (ref 15–41)
Albumin: 4.5 g/dL (ref 3.5–5.0)
Alkaline Phosphatase: 64 U/L (ref 38–126)
Anion gap: 8 (ref 5–15)
BUN: 20 mg/dL (ref 6–20)
CO2: 28 mmol/L (ref 22–32)
Calcium: 9.2 mg/dL (ref 8.9–10.3)
Chloride: 100 mmol/L — ABNORMAL LOW (ref 101–111)
Creatinine, Ser: 1.07 mg/dL — ABNORMAL HIGH (ref 0.44–1.00)
GFR calc Af Amer: >60 mL/min (ref >60)
GFR calc non Af Amer: >60 mL/min (ref >60)
Glucose, Bld: 71 mg/dL (ref 65–99)
Potassium: 3.6 mmol/L (ref 3.5–5.1)
Sodium: 136 mmol/L (ref 135–145)
Total Bilirubin: 0.6 mg/dL (ref 0.3–1.2)
Total Protein: 7.5 g/dL (ref 6.5–8.1)

## 2016-01-16 MED ORDER — LORATADINE 10 MG PO TABS: 10.0000 mg | ORAL_TABLET | Freq: Every day | ORAL | Status: DC

## 2016-01-16 MED ORDER — VEDOLIZUMAB 300 MG IV SOLR
300.0000 mg | Freq: Once | INTRAVENOUS | Status: AC
  Administered 2016-01-16: 300 mg via INTRAVENOUS
  Filled 2016-01-16: qty 5

## 2016-01-16 MED ORDER — SODIUM CHLORIDE 0.9 % IV SOLN
INTRAVENOUS | Status: DC
  Administered 2016-01-16: 08:00:00 via INTRAVENOUS

## 2016-01-16 MED ORDER — ACETAMINOPHEN 325 MG PO TABS: 650.0000 mg | ORAL_TABLET | Freq: Once | ORAL | Status: DC

## 2016-01-16 MED ORDER — METHYLPREDNISOLONE SODIUM SUCC 40 MG IJ SOLR
40.0000 mg | Freq: Once | INTRAMUSCULAR | Status: AC
  Administered 2016-01-16: 40 mg via INTRAVENOUS
  Filled 2016-01-16: qty 1

## 2016-01-17 LAB — HEPATITIS A ANTIBODY, TOTAL: Hep A Total Ab: POSITIVE — AB

## 2016-01-18 LAB — CALCITRIOL (1,25 DI-OH VIT D): Vit D, 1,25-Dihydroxy: 43.5 pg/mL (ref 19.9–79.3)

## 2016-01-18 NOTE — Progress Notes (Signed)
Results for [name] [name],  [name] [name] (MRN [id number]) as of 01/18/2016 15:40  Ref. Range 01/16/2016 07:59  Sodium Latest Ref Range: 135 - 145 mmol/L 136  Potassium Latest Ref Range: 3.5 - 5.1 mmol/L 3.6  Chloride Latest Ref Range: 101 - 111 mmol/L 100 (L)  CO2 Latest Ref Range: 22 - 32 mmol/L 28  BUN Latest Ref Range: 6 - 20 mg/dL 20  Creatinine Latest Ref Range: 0.44 - 1.00 mg/dL 1.07 (H)  Calcium Latest Ref Range: 8.9 - 10.3 mg/dL 9.2  EGFR (Non-African Amer.) Latest Ref Range: >60 mL/min >60  EGFR (African American) Latest Ref Range: >60 mL/min >60  Glucose Latest Ref Range: 65 - 99 mg/dL 71  Anion gap Latest Ref Range: 5 - 15  8  Alkaline Phosphatase Latest Ref Range: 38 - 126 U/L 64  Albumin Latest Ref Range: 3.5 - 5.0 g/dL 4.5  AST Latest Ref Range: 15 - 41 U/L 12 (L)  ALT Latest Ref Range: 14 - 54 U/L 11 (L)  Total Protein Latest Ref Range: 6.5 - 8.1 g/dL 7.5  Total Bilirubin Latest Ref Range: 0.3 - 1.2 mg/dL 0.6  Vit D, 1,25-Dihydroxy Latest Ref Range: 19.9 - 79.3 pg/mL 43.5  WBC Latest Ref Range: 4.0 - 10.5 K/uL 8.4  RBC Latest Ref Range: 3.87 - 5.11 MIL/uL 4.78  Hemoglobin Latest Ref Range: 12.0 - 15.0 g/dL 14.6  HCT Latest Ref Range: 36.0 - 46.0 % 42.8  MCV Latest Ref Range: 78.0 - 100.0 fL 89.5  MCH Latest Ref Range: 26.0 - 34.0 pg 30.5  MCHC Latest Ref Range: 30.0 - 36.0 g/dL 34.1  RDW Latest Ref Range: 11.5 - 15.5 % 13.3  Platelets Latest Ref Range: 150 - 400 K/uL 124 (L)  Hep A Ab, Total Latest Ref Range: Negative  Positive (A)   

## 2016-01-23 NOTE — Progress Notes (Signed)
LOOKS LIKE THESE LABS WERE REQUESTED BY DR. FIELDS.   KEEP UPCOMING APPOINTMENT WITH DR. FIELDS.  Vit D normal. Immune to hep A. CMET unremarkable. Slight decrease in platelet count.   Further labs per Dr. Oneida Alar.

## 2016-01-24 NOTE — Progress Notes (Signed)
Pt is aware of results. She wants to know if there is anything else she needs to do before her appt on 02/09/2016?

## 2016-02-09 ENCOUNTER — Ambulatory Visit (INDEPENDENT_AMBULATORY_CARE_PROVIDER_SITE_OTHER): Payer: 59 | Admitting: Gastroenterology

## 2016-02-09 ENCOUNTER — Encounter: Payer: Self-pay | Admitting: Gastroenterology

## 2016-02-09 DIAGNOSIS — K219 Gastro-esophageal reflux disease without esophagitis: Secondary | ICD-10-CM

## 2016-02-09 DIAGNOSIS — K50013 Crohn's disease of small intestine with fistula: Secondary | ICD-10-CM | POA: Diagnosis not present

## 2016-02-09 NOTE — Assessment & Plan Note (Signed)
SYMPTOMS FAIRLY WELL CONTROLLED BUT ENTYVIO MAKES HER FEEL BAD FOR 2 DAYS AFTER INFUSION. INTERMITTENT DIARRHEA DUE TO STRESS/IBS.   DRINK WATER TO KEEP YOUR URINE LIGHT YELLOW. FOLLOW A LOW FODMAP DIET. AVOID ITEMS THAT CAUSE BLOATING & GAS. SEE HANDOUT. CONTINUE YOUR WEIGHT LOSS EFFORTS. TRY TO STOP SMOKING. Ask DR. REDDY ABOUT SWITCHING TO PAXIL TO PREVENT LOOSE STOOLS/TREAT DEPRESSION. CONTINUE ENTYVIO EVERY 8 WEEKS. TORADOL CAN MAKES CROHN'S FLARE. NO ASPIRIN, BC/GOODY POWDERS, IBUPROFEN/MOTRIN, OR NAPROXEN/ALEVE. WILL ALSO MAKE YOUR CROHN'S FLARE.  FOLLOW UP IN 4 MOS.

## 2016-02-09 NOTE — Patient Instructions (Addendum)
DRINK WATER TO KEEP YOUR URINE LIGHT YELLOW.  FOLLOW A LOW FODMAP DIET. AVOID ITEMS THAT CAUSE BLOATING & GAS. SEE HANDOUT.  CONTINUE YOUR WEIGHT LOSS EFFORTS.  TRY TO STOP SMOKING.  Ask DR. REDDY ABOUT SWITCHING TO PAXIL.  CONTINUE ENTYVIO EVERY 8 WEEKS.  TORADOL CAN MAKE YOUR CROHN'S FLARE. NO ASPIRIN, BC/GOODY POWDERS, IBUPROFEN/MOTRIN, OR NAPROXEN/ALEVE. THESE WILL ALSO MAKE YOUR CROHN'S FLARE.  TO BETTER CONTROL REFLUX USE ZANTAC 150 MG BID MON-FRI AND AVOID REFLUX TRIGGERS. SEE INFO BELOW.   FOLLOW UP IN 4 MOS.   FOLLOW LIFESTYLE RECOMMENDATION TO HELP MANAGE YOUR CHEST PAIN. SEE INFO BELOW.  CONTINUE NEXIUM. TAKE 30 MINUTES BEFORE MEALS TWICE DAILY.  USE ALAMAG AS NEEDED FOR CHEST HEAVINESS/CHEST PAIN/HEARTBURN.  FOLLOW A LOW FAT DIET. SEE INFO BELOW.  START AN EXERCISE PROGRAM. OU SHOULD WALK FOR 30 MINS STRAIGHT 3 TIMES A WEEK.  YOU SHOULD LOSE 10 LBS WITHIN THE NEXT 3 MOS.  FOLLOW UP IN 2 MOS.   TAKE YOUR ASPIRIN DAILY.  CONTINUE OMEPRAZOLE TWICE DAILY. TAKE 30 MINUTES TO 1 HOUR PRIOR TO MEALS.  CONTINUE TENUATE. TAKE  HOUR BEFORE MEALS UP TO 3 TIMES A DAY.  DRINK WATER. AVOID KOOL-AID, SWEET TEA, JUICE, & SODA.  FOLLOW UP IN 2 MOS.     Lifestyle and home remedies TO MANAGE REFLUX/CHEST PAIN  You may eliminate or reduce the frequency of heartburn by making the following lifestyle changes:  . Control your weight. Being overweight is a major risk factor for heartburn and GERD. Excess pounds put pressure on your abdomen, pushing up your stomach and causing acid to back up into your esophagus.   . Eat smaller meals. 4 TO 6 MEALS A DAY. This reduces pressure on the lower esophageal sphincter, helping to prevent the valve from opening and acid from washing back into your esophagus.   [name] [name] your belt. Clothes that fit tightly around your waist put pressure on your abdomen and the lower esophageal sphincter.   . Eliminate heartburn triggers. Everyone has  specific triggers. Common triggers such as fatty or fried foods, spicy food, tomato sauce, carbonated beverages, alcohol, chocolate, mint, garlic, onion, caffeine and nicotine may make heartburn worse.   [name] [name] Kitchen Avoid stooping or bending. Tying your shoes is OK. Bending over for longer periods to weed your garden isn't, especially soon after eating.   . Don't lie down after a meal. Wait at least three to four hours after eating before going to bed, and don't lie down right after eating.   [name] [name] Kitchen PUT THE HEAD OF YOUR BED ON 6 INCH BLOCKS.   Alternative medicine . Several home remedies exist for treating GERD, but they provide only temporary relief. They include drinking baking soda (sodium bicarbonate) added to water or drinking other fluids such as baking soda mixed with cream of tartar and water.  . Although these liquids create temporary relief by neutralizing, washing away or buffering acids, eventually they aggravate the situation by adding gas and fluid to your stomach, increasing pressure and causing more acid reflux. Further, adding more sodium to your diet may increase your blood pressure and add stress to your heart, and excessive bicarbonate ingestion can alter the acid-base balance in your body.

## 2016-02-09 NOTE — Assessment & Plan Note (Signed)
SYMPTOMS NOT IDEALLY CONTROLLED.PT GAINED WEIGHT  AND STILL SMOKES.  DRINK WATER TO KEEP YOUR URINE LIGHT YELLOW.  FOLLOW A LOW FODMAP DIET. AVOID ITEMS THAT CAUSE BLOATING & GAS. SEE HANDOUT. CONTINUE YOUR WEIGHT LOSS EFFORTS.  ZANTAC BID MON-FRI. ZANTAC HELPS MOST WHEN USED AS NEEDED.  FOLLOW UP IN 4 MOS.

## 2016-02-09 NOTE — Progress Notes (Signed)
cc'ed to pcp °

## 2016-02-09 NOTE — Progress Notes (Signed)
ON RECALL  °

## 2016-02-09 NOTE — Progress Notes (Signed)
Subjective:    Patient ID: [name] [name], female    DOB: [birth year], 45 y.o.   MRN: [id number]  [name], MD [facility]: DR. Reece Levy.  HPI MAKES HER FEEL LIKE CRAP FOR A COUPLE DAYS(JOINT PAIN, FATIGUE). RATHER TAKE PREDNISONE. WEIGHT UP A LITTLE. BMs: ONCE A DAY(#4 AT TIMES, #7: 1X/WEEK). HEARTBURN: ALMOST QD OR QOD, BUT NOT TAKING ZANTAC. CIGS: 1/2 PACK/DAY. Flu shot AUG 2017. HAD TWO SHOT PRENVAR/PENUMOVAX: AUG 2017.  PT DENIES FEVER, CHILLS, HEMATOCHEZIA, nausea, vomiting, melena, CHEST PAIN, SHORTNESS OF BREATH, CHANGE IN BOWEL IN HABITS, constipation, abdominal pain, OR problems swallowing.  Past Medical History:  Diagnosis Date  . Anxiety   . Arthritis    Dr. Eddie Dibbles  . Bartholin cyst 2008   vag.  [name] BIPOLAR AFFECTIVE DISORDER 04/14/2007  . 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  [name] Depression   . Elevated glucose 2010  . GERD (gastroesophageal reflux disease)   . Hypertension   . Kidney stone   . LBP (low back pain)    Dr. Mina Marble  . Perianal abscess 2009  . Vitamin B12 deficiency    Past Surgical History:  Procedure Laterality Date  . COLONOSCOPY  09/2008   Dr. Derrill Kay: ulcers, edema, possible fistula openings all seen in the distal 3 cm of anus/anal canal. 1cm pseudopolyp. rest of colon and TI normal. rectal biopsy with mild chronic active colitis. ileum bx normal.  . COLONOSCOPY WITH PROPOFOL N/A 03/29/2015   SL[facility]: Severe proctocolitis limitied to cecum and rectum. Limited exam of the colon mucosa and anal canal.   . ELBOW SURGERY     left  . ESOPHAGOGASTRODUODENOSCOPY (EGD) WITH PROPOFOL N/A 03/29/2015   SLF: 1. Erosive gastritis duodentitis.   [name] RECTOVAGINAL FISTULA CLOSURE     did not help   Allergies  Allergen Reactions  . Cephalexin     itching  . Codeine   . Guaifenesin   . Imitrex [Sumatriptan]     2 fingers got very hot  . Morphine   . 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 Sulfonamide Derivatives   . Ultram [Tramadol Hcl]     [employer] syndrome with either remicade or ultram   Current Outpatient Prescriptions  Medication Sig Dispense Refill  . ALPRAZolam (XANAX) 0.5 MG tablet Take 1 tablet (0.5 mg total) by mouth at bedtime as needed. (Patient taking differently: Take 0.5 mg by mouth 4 (four) times daily. )    . benazepril-hydrochlorthiazide (LOTENSIN HCT) 20-12.5 MG tablet TAKE ONE TABLET BY MOUTH ONCE EVERY DAY    . cyanocobalamin (,VITAMIN B-12,) 1000 MCG/ML injection Inject 1 mL (1,000 mcg total) into the skin every 14 (fourteen) days.    . cyclobenzaprine (FLEXERIL) 10 MG tablet TAKE ONE HALF TO ONE TABLET BY MOUTH EVERY DAY AS NEEDED FOR MUSCLE SPASMS    . HYDROcodone-acetaminophen (NORCO) 10-325 MG tablet Take 1 tablet by mouth every 6 (six) hours as needed for severe pain. Please fill on or after 01/22/16    . ketorolac (TORADOL) 10 MG tablet Take 1 tablet (10 mg total) by mouth every 6 (six) hours as needed. for pain    . lamoTRIgine (LAMICTAL) 200 MG tablet Take 1 tablet by mouth daily.    . meperidine (DEMEROL) 50 MG tablet Take 1 tablet (50 mg total) by mouth daily as needed. For sever pain    .  metroNIDAZOLE (METROGEL) 0.75 % vaginal gel Place 1 Applicatorful vaginally at bedtime. Apply one applicatorful to vagina at bedtime for 5 days    . norethindrone (AYGESTIN) 5 MG tablet Take 1 tablet (5 mg total) by mouth daily.    . sertraline (ZOLOFT) 100 MG tablet Take 100 mg by mouth 2 (two) times daily.       ENYTYVIO Inject 300 mg  Q8 WEEKS    .      .      .       Review of Systems PER HPI OTHERWISE ALL SYSTEMS ARE NEGATIVE.    Objective:   Physical Exam  Constitutional: She is oriented to person, place, and time. She appears well-developed and well-nourished. No distress.  HENT:  Head: Normocephalic and atraumatic.  Mouth/Throat: Oropharynx is clear and moist. No oropharyngeal exudate.  EDENTULOUS    Eyes: Pupils are equal, round, and reactive to light. No scleral icterus.  Neck: Normal range of motion. Neck supple.  Cardiovascular: Normal rate and regular rhythm.   Murmur heard. Pulmonary/Chest: Effort normal and breath sounds normal. No respiratory distress.  Abdominal: Soft. Bowel sounds are normal. She exhibits no distension. There is tenderness. There is no rebound and no guarding.  MILD RUQ TTP, MILD TTP IN THE EPIGASTRIUM   Musculoskeletal: She exhibits no edema.  Lymphadenopathy:    She has no cervical adenopathy.  Neurological: She is alert and oriented to person, place, and time.  NO  NEW FOCAL DEFICITS  Psychiatric:  FLAT AFFECT, SLIGHTLY DEPRESSED MOOD   Vitals reviewed.     Assessment & Plan:

## 2016-02-27 ENCOUNTER — Encounter: Payer: Self-pay | Admitting: Internal Medicine

## 2016-02-27 ENCOUNTER — Ambulatory Visit (INDEPENDENT_AMBULATORY_CARE_PROVIDER_SITE_OTHER): Payer: 59 | Admitting: Internal Medicine

## 2016-02-27 DIAGNOSIS — G8929 Other chronic pain: Secondary | ICD-10-CM | POA: Diagnosis not present

## 2016-02-27 DIAGNOSIS — I10 Essential (primary) hypertension: Secondary | ICD-10-CM

## 2016-02-27 DIAGNOSIS — M5440 Lumbago with sciatica, unspecified side: Secondary | ICD-10-CM

## 2016-02-27 DIAGNOSIS — E538 Deficiency of other specified B group vitamins: Secondary | ICD-10-CM

## 2016-02-27 MED ORDER — HYDROCODONE-ACETAMINOPHEN 10-325 MG PO TABS: 1.0000 | ORAL_TABLET | Freq: Four times a day (QID) | ORAL | 0 refills | Status: DC | PRN

## 2016-02-27 MED ORDER — KETOROLAC TROMETHAMINE 10 MG PO TABS: 10.0000 mg | ORAL_TABLET | Freq: Four times a day (QID) | ORAL | 1 refills | Status: DC | PRN

## 2016-02-27 MED ORDER — CYCLOBENZAPRINE HCL 10 MG PO TABS: 10.0000 mg | ORAL_TABLET | Freq: Every day | ORAL | 5 refills | Status: DC

## 2016-02-27 NOTE — Progress Notes (Signed)
Subjective:  Patient ID: [name] [name], female    DOB: 08-Mar-1970  Age: 46 y.o. MRN: [id number]  CC: No chief complaint on file.   HPI [name] [name] presents for anxiety, LBP, B12 def, colitis f/u  Outpatient Medications Prior to Visit  Medication Sig Dispense Refill  . ALPRAZolam (XANAX) 0.5 MG tablet Take 1 tablet (0.5 mg total) by mouth at bedtime as needed. (Patient taking differently: Take 0.5 mg by mouth 4 (four) times daily. ) 30 tablet 2  . benazepril-hydrochlorthiazide (LOTENSIN HCT) 20-12.5 MG tablet TAKE ONE TABLET BY MOUTH ONCE EVERY DAY 30 tablet 11  . ciprofloxacin (CIPRO) 500 MG tablet Take 1 tablet (500 mg total) by mouth 2 (two) times daily. 20 tablet 1  . Clobetasol Propionate (TEMOVATE) 0.05 % external spray Apply topically 2 (two) times daily. On scalp 59 mL 1  . cyanocobalamin (,VITAMIN B-12,) 1000 MCG/ML injection Inject 1 mL (1,000 mcg total) into the skin every 14 (fourteen) days. 10 mL 3  . cyclobenzaprine (FLEXERIL) 10 MG tablet TAKE ONE HALF TO ONE TABLET BY MOUTH EVERY DAY AS NEEDED FOR MUSCLE SPASMS 30 tablet 1  . HYDROcodone-acetaminophen (NORCO) 10-325 MG tablet Take 1 tablet by mouth every 6 (six) hours as needed for severe pain. Please fill on or after 01/22/16 120 tablet 0  . ketorolac (TORADOL) 10 MG tablet Take 1 tablet (10 mg total) by mouth every 6 (six) hours as needed. for pain 20 tablet 1  . lamoTRIgine (LAMICTAL) 200 MG tablet Take 1 tablet by mouth daily.    . meperidine (DEMEROL) 50 MG tablet Take 1 tablet (50 mg total) by mouth daily as needed. For sever pain 20 tablet 0  . metroNIDAZOLE (METROGEL) 0.75 % vaginal gel Place 1 Applicatorful vaginally at bedtime. Apply one applicatorful to vagina at bedtime for 5 days 70 g 1  . norethindrone (AYGESTIN) 5 MG tablet Take 1 tablet (5 mg total) by mouth daily. 90 tablet 3  . omeprazole (PRILOSEC) 20 MG capsule 1 PO 30 mins prior to breakfast and supper 60 capsule 11  . sertraline (ZOLOFT) 100 MG  tablet Take 100 mg by mouth 2 (two) times daily.      . sodium chloride 0.9 % SOLN 250 mL with vedolizumab 300 MG SOLR 300 mg Inject 300 mg into the vein. LOADING DOSE THEN Q8 WEEKS     No facility-administered medications prior to visit.     ROS Review of Systems  Constitutional: Positive for unexpected weight change. Negative for activity change, appetite change, chills and fatigue.  HENT: Negative for congestion, mouth sores and sinus pressure.   Eyes: Negative for visual disturbance.  Respiratory: Negative for cough and chest tightness.   Gastrointestinal: Positive for diarrhea. Negative for abdominal pain and nausea.  Genitourinary: Negative for difficulty urinating, frequency and vaginal pain.  Musculoskeletal: Positive for back pain. Negative for gait problem.  Skin: Negative for pallor and rash.  Neurological: Negative for dizziness, tremors, weakness, numbness and headaches.  Psychiatric/Behavioral: Negative for confusion, sleep disturbance and suicidal ideas. The patient is nervous/anxious.     Objective:  BP 140/80   Pulse (!) 103   Wt 167 lb (75.8 kg)   SpO2 97%   BMI 31.55 kg/m   BP Readings from Last 3 Encounters:  02/27/16 140/80  02/09/16 133/88  01/16/16 118/83    Wt Readings from Last 3 Encounters:  02/27/16 167 lb (75.8 kg)  02/09/16 161 lb 9.6 oz (73.3 kg)  01/16/16 161 lb (  73 kg)    Physical Exam  Constitutional: She appears well-developed. No distress.  HENT:  Head: Normocephalic.  Right Ear: External ear normal.  Left Ear: External ear normal.  Nose: Nose normal.  Mouth/Throat: Oropharynx is clear and moist.  Eyes: Conjunctivae are normal. Pupils are equal, round, and reactive to light. Right eye exhibits no discharge. Left eye exhibits no discharge.  Neck: Normal range of motion. Neck supple. No JVD present. No tracheal deviation present. No thyromegaly present.  Cardiovascular: Normal rate, regular rhythm and normal heart sounds.     Pulmonary/Chest: No stridor. No respiratory distress. She has no wheezes.  Abdominal: Soft. Bowel sounds are normal. She exhibits no distension and no mass. There is no tenderness. There is no rebound and no guarding.  Musculoskeletal: She exhibits tenderness. She exhibits no edema.  Lymphadenopathy:    She has no cervical adenopathy.  Neurological: She displays normal reflexes. No cranial nerve deficit. She exhibits normal muscle tone. Coordination normal.  Skin: No rash noted. No erythema.  Psychiatric: She has a normal mood and affect. Her behavior is normal. Judgment and thought content normal.  Obese LS tender  Lab Results  Component Value Date   WBC 8.4 01/16/2016   HGB 14.6 01/16/2016   HCT 42.8 01/16/2016   PLT 124 (L) 01/16/2016   GLUCOSE 71 01/16/2016   CHOL 168 08/30/2014   TRIG 99.0 08/30/2014   HDL 27.40 (L) 08/30/2014   LDLDIRECT 171.9 05/01/2011   LDLCALC 121 (H) 08/30/2014   ALT 11 (L) 01/16/2016   AST 12 (L) 01/16/2016   NA 136 01/16/2016   K 3.6 01/16/2016   CL 100 (L) 01/16/2016   CREATININE 1.07 (H) 01/16/2016   BUN 20 01/16/2016   CO2 28 01/16/2016   TSH 1.83 08/30/2014    No results found.  Assessment & Plan:   There are no diagnoses linked to this encounter. I am having Ms. Quest maintain her sertraline, ALPRAZolam, lamoTRIgine, omeprazole, cyanocobalamin, sodium chloride 0.9 % SOLN 250 mL with vedolizumab 300 MG SOLR 300 mg, Clobetasol Propionate, benazepril-hydrochlorthiazide, ketorolac, meperidine, HYDROcodone-acetaminophen, metroNIDAZOLE, ciprofloxacin, norethindrone, and cyclobenzaprine.  No orders of the defined types were placed in this encounter.    Follow-up: No Follow-up on file.  [name], MD

## 2016-02-27 NOTE — Patient Instructions (Signed)
Try TRE - Trauma Release Exercise

## 2016-02-27 NOTE — Progress Notes (Signed)
Pre visit review using our clinic review tool, if applicable. No additional management support is needed unless otherwise documented below in the visit note. 

## 2016-02-27 NOTE — Assessment & Plan Note (Signed)
On B12 

## 2016-02-27 NOTE — Assessment & Plan Note (Signed)
  Lotensin HCT 

## 2016-02-27 NOTE — Assessment & Plan Note (Signed)
Try TRE - Trauma Release Exercise Norco, Tramadol prn

## 2016-02-27 NOTE — Assessment & Plan Note (Signed)
Wt Readings from Last 3 Encounters:  02/27/16 167 lb (75.8 kg)  02/09/16 161 lb 9.6 oz (73.3 kg)  01/16/16 161 lb (73 kg)

## 2016-03-12 ENCOUNTER — Encounter (HOSPITAL_COMMUNITY)
Admission: RE | Admit: 2016-03-12 | Discharge: 2016-03-12 | Disposition: A | Discharge status: Home / Self Care | Payer: 59 | Source: Ambulatory Visit | Attending: Gastroenterology | Admitting: Gastroenterology

## 2016-03-12 ENCOUNTER — Encounter (HOSPITAL_COMMUNITY): Payer: Self-pay

## 2016-03-12 DIAGNOSIS — K5090 Crohn's disease, unspecified, without complications: Secondary | ICD-10-CM | POA: Diagnosis not present

## 2016-03-12 MED ORDER — ACETAMINOPHEN 325 MG PO TABS: 650.0000 mg | ORAL_TABLET | Freq: Once | ORAL | Status: DC

## 2016-03-12 MED ORDER — LORATADINE 10 MG PO TABS: 10.0000 mg | ORAL_TABLET | Freq: Once | ORAL | Status: DC

## 2016-03-12 MED ORDER — SODIUM CHLORIDE 0.9 % IV SOLN
INTRAVENOUS | Status: DC
  Administered 2016-03-12: 09:00:00 via INTRAVENOUS

## 2016-03-12 MED ORDER — VEDOLIZUMAB 300 MG IV SOLR
300.0000 mg | Freq: Once | INTRAVENOUS | Status: AC
  Administered 2016-03-12: 300 mg via INTRAVENOUS
  Filled 2016-03-12: qty 5

## 2016-03-12 MED ORDER — METHYLPREDNISOLONE SODIUM SUCC 40 MG IJ SOLR
40.0000 mg | Freq: Once | INTRAMUSCULAR | Status: AC
  Administered 2016-03-12: 40 mg via INTRAVENOUS
  Filled 2016-03-12: qty 1

## 2016-03-12 MED ORDER — VEDOLIZUMAB 300 MG IV SOLR
300.0000 mg | Freq: Once | INTRAVENOUS | Status: DC
  Filled 2016-03-12: qty 5

## 2016-03-26 DIAGNOSIS — F3181 Bipolar II disorder: Secondary | ICD-10-CM | POA: Diagnosis not present

## 2016-04-17 ENCOUNTER — Other Ambulatory Visit: Payer: Self-pay

## 2016-04-17 MED ORDER — BENAZEPRIL-HYDROCHLOROTHIAZIDE 20-12.5 MG PO TABS: 1.0000 | ORAL_TABLET | Freq: Every day | ORAL | 1 refills | Status: DC

## 2016-04-19 ENCOUNTER — Encounter: Payer: Self-pay | Admitting: Gastroenterology

## 2016-05-07 ENCOUNTER — Encounter (HOSPITAL_COMMUNITY): Payer: Self-pay

## 2016-05-07 ENCOUNTER — Encounter (HOSPITAL_COMMUNITY)
Admission: RE | Admit: 2016-05-07 | Discharge: 2016-05-07 | Disposition: A | Discharge status: Home / Self Care | Payer: 59 | Source: Ambulatory Visit | Attending: Gastroenterology | Admitting: Gastroenterology

## 2016-05-07 DIAGNOSIS — K50913 Crohn's disease, unspecified, with fistula: Secondary | ICD-10-CM | POA: Diagnosis not present

## 2016-05-07 MED ORDER — METHYLPREDNISOLONE SODIUM SUCC 40 MG IJ SOLR
40.0000 mg | Freq: Once | INTRAMUSCULAR | Status: AC
  Administered 2016-05-07: 40 mg via INTRAVENOUS
  Filled 2016-05-07: qty 1

## 2016-05-07 MED ORDER — VEDOLIZUMAB 300 MG IV SOLR
300.0000 mg | Freq: Once | INTRAVENOUS | Status: AC
  Administered 2016-05-07: 300 mg via INTRAVENOUS
  Filled 2016-05-07: qty 5

## 2016-05-07 MED ORDER — SODIUM CHLORIDE 0.9 % IV SOLN
INTRAVENOUS | Status: DC
Start: 2016-05-07 — End: 2016-05-08
  Administered 2016-05-07: 1000 mL via INTRAVENOUS

## 2016-05-07 MED ORDER — LORATADINE 10 MG PO TABS: 10.0000 mg | ORAL_TABLET | Freq: Every day | ORAL | Status: DC

## 2016-05-07 MED ORDER — ACETAMINOPHEN 325 MG PO TABS: 650.0000 mg | ORAL_TABLET | Freq: Once | ORAL | Status: DC

## 2016-05-28 ENCOUNTER — Encounter: Payer: Self-pay | Admitting: Internal Medicine

## 2016-05-28 ENCOUNTER — Ambulatory Visit (INDEPENDENT_AMBULATORY_CARE_PROVIDER_SITE_OTHER): Payer: 59 | Admitting: Internal Medicine

## 2016-05-28 ENCOUNTER — Other Ambulatory Visit (INDEPENDENT_AMBULATORY_CARE_PROVIDER_SITE_OTHER): Payer: 59

## 2016-05-28 DIAGNOSIS — G8929 Other chronic pain: Secondary | ICD-10-CM

## 2016-05-28 DIAGNOSIS — K50013 Crohn's disease of small intestine with fistula: Secondary | ICD-10-CM

## 2016-05-28 DIAGNOSIS — F411 Generalized anxiety disorder: Secondary | ICD-10-CM | POA: Diagnosis not present

## 2016-05-28 DIAGNOSIS — M5440 Lumbago with sciatica, unspecified side: Secondary | ICD-10-CM

## 2016-05-28 DIAGNOSIS — E538 Deficiency of other specified B group vitamins: Secondary | ICD-10-CM

## 2016-05-28 DIAGNOSIS — I10 Essential (primary) hypertension: Secondary | ICD-10-CM

## 2016-05-28 LAB — BASIC METABOLIC PANEL
BUN: 19 mg/dL (ref 6–23)
CO2: 28 mEq/L (ref 19–32)
Calcium: 9.3 mg/dL (ref 8.4–10.5)
Chloride: 103 mEq/L (ref 96–112)
Creatinine, Ser: 1.23 mg/dL — ABNORMAL HIGH (ref 0.40–1.20)
GFR: 49.85 mL/min — ABNORMAL LOW (ref >60.00)
Glucose, Bld: 88 mg/dL (ref 70–99)
Potassium: 3.5 mEq/L (ref 3.5–5.1)
Sodium: 137 mEq/L (ref 135–145)

## 2016-05-28 LAB — URINALYSIS, ROUTINE W REFLEX MICROSCOPIC
Bilirubin Urine: NEGATIVE
Ketones, ur: NEGATIVE
Nitrite: NEGATIVE
Specific Gravity, Urine: 1.025 (ref 1.000–1.030)
Total Protein, Urine: NEGATIVE
Urine Glucose: NEGATIVE
Urobilinogen, UA: 0.2 (ref 0.0–1.0)
pH: 6 (ref 5.0–8.0)

## 2016-05-28 LAB — CBC WITH DIFFERENTIAL/PLATELET
Basophils Absolute: 0.1 10*3/uL (ref 0.0–0.1)
Basophils Relative: 0.4 % (ref 0.0–3.0)
Eosinophils Absolute: 0.1 10*3/uL (ref 0.0–0.7)
Eosinophils Relative: 0.6 % (ref 0.0–5.0)
HCT: 42.9 % (ref 36.0–46.0)
Hemoglobin: 15.1 g/dL — ABNORMAL HIGH (ref 12.0–15.0)
Lymphocytes Relative: 20.6 % (ref 12.0–46.0)
Lymphs Abs: 2.4 10*3/uL (ref 0.7–4.0)
MCHC: 35.2 g/dL (ref 30.0–36.0)
MCV: 89.6 fl (ref 78.0–100.0)
Monocytes Absolute: 0.5 10*3/uL (ref 0.1–1.0)
Monocytes Relative: 4.7 % (ref 3.0–12.0)
Neutro Abs: 8.5 10*3/uL — ABNORMAL HIGH (ref 1.4–7.7)
Neutrophils Relative %: 73.7 % (ref 43.0–77.0)
Platelets: 162 10*3/uL (ref 150.0–400.0)
RBC: 4.79 Mil/uL (ref 3.87–5.11)
RDW: 13.2 % (ref 11.5–15.5)
WBC: 11.6 10*3/uL — ABNORMAL HIGH (ref 4.0–10.5)

## 2016-05-28 LAB — HEPATIC FUNCTION PANEL
ALT: 7 U/L (ref 0–35)
AST: 9 U/L (ref 0–37)
Albumin: 4.5 g/dL (ref 3.5–5.2)
Alkaline Phosphatase: 59 U/L (ref 39–117)
Bilirubin, Direct: 0.1 mg/dL (ref 0.0–0.3)
Total Bilirubin: 0.4 mg/dL (ref 0.2–1.2)
Total Protein: 7.1 g/dL (ref 6.0–8.3)

## 2016-05-28 LAB — VITAMIN B12: Vitamin B-12: 250 pg/mL (ref 211–911)

## 2016-05-28 LAB — TSH: TSH: 1.82 u[IU]/mL (ref 0.35–4.50)

## 2016-05-28 MED ORDER — HYDROCODONE-ACETAMINOPHEN 10-325 MG PO TABS: 1.0000 | ORAL_TABLET | Freq: Four times a day (QID) | ORAL | 0 refills | Status: DC | PRN

## 2016-05-28 MED ORDER — CYANOCOBALAMIN 1000 MCG/ML IJ SOLN: 1000.0000 ug | INTRAMUSCULAR | 3 refills | Status: DC

## 2016-05-28 MED ORDER — KETOROLAC TROMETHAMINE 10 MG PO TABS: 10.0000 mg | ORAL_TABLET | Freq: Four times a day (QID) | ORAL | 1 refills | Status: DC | PRN

## 2016-05-28 NOTE — Assessment & Plan Note (Signed)
Xanax prn  Potential benefits of a long term benzodiazepines  use as well as potential risks  and complications were explained to the patient and were aknowledged. 

## 2016-05-28 NOTE — Assessment & Plan Note (Addendum)
Toradol Norco  Potential benefits of a long term opioids use as well as potential risks (i.e. addiction risk, apnea etc) and complications (i.e. Somnolence, constipation and others) were explained to the patient and were aknowledged. UDS

## 2016-05-28 NOTE — Progress Notes (Signed)
Subjective:  Patient ID: [name] [name], female    DOB: [birth year]  Age: 46 y.o. MRN: [id number]  CC: No chief complaint on file.   HPI [name] [name] presents for LBP, wt gain, anxiety  Outpatient Medications Prior to Visit  Medication Sig Dispense Refill  . ALPRAZolam (XANAX) 0.5 MG tablet Take 1 tablet (0.5 mg total) by mouth at bedtime as needed. (Patient taking differently: Take 0.5 mg by mouth 4 (four) times daily. ) 30 tablet 2  . benazepril-hydrochlorthiazide (LOTENSIN HCT) 20-12.5 MG tablet Take 1 tablet by mouth daily. 90 tablet 1  . ciprofloxacin (CIPRO) 500 MG tablet Take 1 tablet (500 mg total) by mouth 2 (two) times daily. 20 tablet 1  . Clobetasol Propionate (TEMOVATE) 0.05 % external spray Apply topically 2 (two) times daily. On scalp 59 mL 1  . cyanocobalamin (,VITAMIN B-12,) 1000 MCG/ML injection Inject 1 mL (1,000 mcg total) into the skin every 14 (fourteen) days. 10 mL 3  . cyclobenzaprine (FLEXERIL) 10 MG tablet Take 1 tablet (10 mg total) by mouth at bedtime. 30 tablet 5  . HYDROcodone-acetaminophen (NORCO) 10-325 MG tablet Take 1 tablet by mouth every 6 (six) hours as needed for severe pain. Please fill on or after 04/26/16 120 tablet 0  . ketorolac (TORADOL) 10 MG tablet Take 1 tablet (10 mg total) by mouth every 6 (six) hours as needed. for pain 20 tablet 1  . lamoTRIgine (LAMICTAL) 200 MG tablet Take 1 tablet by mouth daily.    . meperidine (DEMEROL) 50 MG tablet Take 1 tablet (50 mg total) by mouth daily as needed. For sever pain 20 tablet 0  . metroNIDAZOLE (METROGEL) 0.75 % vaginal gel Place 1 Applicatorful vaginally at bedtime. Apply one applicatorful to vagina at bedtime for 5 days 70 g 1  . norethindrone (AYGESTIN) 5 MG tablet Take 1 tablet (5 mg total) by mouth daily. 90 tablet 3  . omeprazole (PRILOSEC) 20 MG capsule 1 PO 30 mins prior to breakfast and supper 60 capsule 11  . sertraline (ZOLOFT) 100 MG tablet Take 100 mg by mouth 2 (two) times daily.      .  sodium chloride 0.9 % SOLN 250 mL with vedolizumab 300 MG SOLR 300 mg Inject 300 mg into the vein. LOADING DOSE THEN Q8 WEEKS     No facility-administered medications prior to visit.     ROS Review of Systems  Constitutional: Negative for activity change, appetite change, chills, fatigue and unexpected weight change.  HENT: Negative for congestion, mouth sores and sinus pressure.   Eyes: Negative for visual disturbance.  Respiratory: Negative for cough and chest tightness.   Gastrointestinal: Negative for abdominal pain and nausea.  Genitourinary: Negative for difficulty urinating, frequency and vaginal pain.  Musculoskeletal: Positive for back pain. Negative for gait problem.  Skin: Negative for pallor and rash.  Neurological: Negative for dizziness, tremors, weakness, numbness and headaches.  Psychiatric/Behavioral: Negative for confusion and sleep disturbance. The patient is nervous/anxious.     Objective:  BP 126/82 (BP Location: Right Arm, Patient Position: Sitting, Cuff Size: Normal)   Pulse (!) 102   Temp 98.2 F (36.8 C) (Oral)   Ht 5' 1"  (1.549 m)   Wt 164 lb 1.9 oz (74.4 kg)   SpO2 95%   BMI 31.01 kg/m   BP Readings from Last 3 Encounters:  05/28/16 126/82  05/07/16 123/85  03/12/16 129/71    Wt Readings from Last 3 Encounters:  05/28/16 164 lb 1.9 oz (74.4  kg)  05/07/16 165 lb (74.8 kg)  03/12/16 167 lb (75.8 kg)    Physical Exam  Constitutional: She appears well-developed. No distress.  HENT:  Head: Normocephalic.  Right Ear: External ear normal.  Left Ear: External ear normal.  Nose: Nose normal.  Mouth/Throat: Oropharynx is clear and moist.  Eyes: Conjunctivae are normal. Pupils are equal, round, and reactive to light. Right eye exhibits no discharge. Left eye exhibits no discharge.  Neck: Normal range of motion. Neck supple. No JVD present. No tracheal deviation present. No thyromegaly present.  Cardiovascular: Normal rate, regular rhythm and normal  heart sounds.   Pulmonary/Chest: No stridor. No respiratory distress. She has no wheezes.  Abdominal: Soft. Bowel sounds are normal. She exhibits no distension and no mass. There is no tenderness. There is no rebound and no guarding.  Musculoskeletal: She exhibits tenderness. She exhibits no edema.  Lymphadenopathy:    She has no cervical adenopathy.  Neurological: She displays normal reflexes. No cranial nerve deficit. She exhibits normal muscle tone. Coordination normal.  Skin: No rash noted. No erythema.  Psychiatric: She has a normal mood and affect. Her behavior is normal. Judgment and thought content normal.  Obese LS tender  Lab Results  Component Value Date   WBC 8.4 01/16/2016   HGB 14.6 01/16/2016   HCT 42.8 01/16/2016   PLT 124 (L) 01/16/2016   GLUCOSE 71 01/16/2016   CHOL 168 08/30/2014   TRIG 99.0 08/30/2014   HDL 27.40 (L) 08/30/2014   LDLDIRECT 171.9 05/01/2011   LDLCALC 121 (H) 08/30/2014   ALT 11 (L) 01/16/2016   AST 12 (L) 01/16/2016   NA 136 01/16/2016   K 3.6 01/16/2016   CL 100 (L) 01/16/2016   CREATININE 1.07 (H) 01/16/2016   BUN 20 01/16/2016   CO2 28 01/16/2016   TSH 1.83 08/30/2014    No results found.  Assessment & Plan:   There are no diagnoses linked to this encounter. I am having [name] [name] maintain her sertraline, ALPRAZolam, lamoTRIgine, omeprazole, cyanocobalamin, sodium chloride 0.9 % SOLN 250 mL with vedolizumab 300 MG SOLR 300 mg, Clobetasol Propionate, meperidine, metroNIDAZOLE, ciprofloxacin, norethindrone, cyclobenzaprine, ketorolac, HYDROcodone-acetaminophen, and benazepril-hydrochlorthiazide.  No orders of the defined types were placed in this encounter.    Follow-up: No Follow-up on file.  [name], MD

## 2016-05-28 NOTE — Assessment & Plan Note (Signed)
On B12 

## 2016-05-28 NOTE — Progress Notes (Signed)
Pre visit review using our clinic review tool, if applicable. No additional management support is needed unless otherwise documented below in the visit note. 

## 2016-05-28 NOTE — Assessment & Plan Note (Signed)
Lotensin HCT

## 2016-05-28 NOTE — Assessment & Plan Note (Signed)
Was on Prednisone for injections and Entyvio

## 2016-06-05 ENCOUNTER — Other Ambulatory Visit: Payer: Self-pay | Admitting: *Deleted

## 2016-06-05 ENCOUNTER — Encounter: Payer: Self-pay | Admitting: Obstetrics and Gynecology

## 2016-06-05 ENCOUNTER — Other Ambulatory Visit: Payer: Self-pay | Admitting: Obstetrics and Gynecology

## 2016-06-05 ENCOUNTER — Encounter: Payer: Self-pay | Admitting: Internal Medicine

## 2016-06-05 MED ORDER — METRONIDAZOLE 0.75 % VA GEL: 1.0000 | Freq: Every day | VAGINAL | 1 refills | Status: DC

## 2016-06-05 MED ORDER — CIPROFLOXACIN HCL 500 MG PO TABS: 500.0000 mg | ORAL_TABLET | Freq: Two times a day (BID) | ORAL | 1 refills | Status: DC

## 2016-06-05 NOTE — Telephone Encounter (Signed)
Reordered for metrogel and Cipro

## 2016-06-06 ENCOUNTER — Telehealth: Payer: Self-pay | Admitting: *Deleted

## 2016-06-06 NOTE — Telephone Encounter (Signed)
Informed prescriptions were refilled.

## 2016-06-07 ENCOUNTER — Other Ambulatory Visit: Payer: Self-pay | Admitting: Internal Medicine

## 2016-06-07 ENCOUNTER — Encounter (HOSPITAL_COMMUNITY): Payer: Self-pay | Admitting: Emergency Medicine

## 2016-06-07 ENCOUNTER — Encounter: Payer: Self-pay | Admitting: Gastroenterology

## 2016-06-07 ENCOUNTER — Emergency Department (HOSPITAL_COMMUNITY)
Admission: EM | Admit: 2016-06-07 | Discharge: 2016-06-08 | Disposition: A | Discharge status: Home / Self Care | Payer: 59 | Attending: Emergency Medicine | Admitting: Emergency Medicine

## 2016-06-07 ENCOUNTER — Ambulatory Visit (INDEPENDENT_AMBULATORY_CARE_PROVIDER_SITE_OTHER): Payer: 59 | Admitting: Gastroenterology

## 2016-06-07 DIAGNOSIS — F909 Attention-deficit hyperactivity disorder, unspecified type: Secondary | ICD-10-CM | POA: Diagnosis not present

## 2016-06-07 DIAGNOSIS — K219 Gastro-esophageal reflux disease without esophagitis: Secondary | ICD-10-CM

## 2016-06-07 DIAGNOSIS — N824 Other female intestinal-genital tract fistulae: Secondary | ICD-10-CM

## 2016-06-07 DIAGNOSIS — N12 Tubulo-interstitial nephritis, not specified as acute or chronic: Secondary | ICD-10-CM | POA: Insufficient documentation

## 2016-06-07 DIAGNOSIS — K50013 Crohn's disease of small intestine with fistula: Secondary | ICD-10-CM | POA: Diagnosis not present

## 2016-06-07 DIAGNOSIS — F17210 Nicotine dependence, cigarettes, uncomplicated: Secondary | ICD-10-CM | POA: Insufficient documentation

## 2016-06-07 DIAGNOSIS — R109 Unspecified abdominal pain: Secondary | ICD-10-CM

## 2016-06-07 DIAGNOSIS — I10 Essential (primary) hypertension: Secondary | ICD-10-CM | POA: Insufficient documentation

## 2016-06-07 LAB — URINALYSIS, ROUTINE W REFLEX MICROSCOPIC
Bilirubin Urine: NEGATIVE
Glucose, UA: NEGATIVE mg/dL
Ketones, ur: NEGATIVE mg/dL
Nitrite: NEGATIVE
Protein, ur: NEGATIVE mg/dL
Specific Gravity, Urine: 1.023 (ref 1.005–1.030)
pH: 6 (ref 5.0–8.0)

## 2016-06-07 NOTE — Assessment & Plan Note (Signed)
SYMPTOMS FAIRLY WELL CONTROLLED.  CONTINUE ENTYVIO EVERY 8 WEEKS. DRINK WATER TO KEEP YOUR URINE LIGHT YELLOW. YOU SHOULD TRANSITION TO A PLANT BASED DIET-NO MEAT OR DAIRY. AVOID ITEMS THAT CAUSE BLOATING & GAS. I RECOMMEND THE BOOK, "PREVENT AND REVERSE HEART DISEASE". CALDWELL ESSELTYN JR., MD. PAGE 120-121 CLEARLY STATE THE RULES AND QUICK AND EASY RECIPES FOR BREAKFAST, LUNCH, AND DINNER ARE AFTER P 127. CONTINUE YOUR WEIGHT LOSS EFFORTS. TRY TO STOP SMOKING.  FOLLOW UP IN 6 MOS.

## 2016-06-07 NOTE — Progress Notes (Signed)
Subjective:    Patient ID: [name] [name], female    DOB: 10-03-1970, 46 y.o.   MRN: [id number] [name] Kehr, MD   HPI Having Entyvio every 8 weeks. 2 days LATER feels like crap. THINGS GET BACK TO NORMAL(STOOLS) BUT After 5-6 later feels CONSTRICTED AND INFLAMED. FEELS LIKE HARD TO PASS STOOL, SOFT AND IT'S SMALL CALIBER/SPIRAL STOOL. FEELS LIKE THINGS ARE INFLAMED. NO BLOOD IN STOOL. HAS A KNOT ON SIDE OF ANUS(USES METROGEL). HAS FISSURES/KNOT BETWEEN VAGINA AND ANUS. GOING THROUGH THERE AS WELL. TAKES 3 DAYS WORTH OF CIPRO FOR KNOT(GIVEN BY GYN). LAST DIARRHEA: LAST WEEK. WEIGHT STABLE. MAY HAVE BLOATING OFF AND ON.  PT DENIES FEVER, CHILLS, HEMATEMESIS, nausea, vomiting, melena, CHEST PAIN, SHORTNESS OF BREATH, CHANGE IN BOWEL IN HABITS, constipation, abdominal pain, problems swallowing, OR heartburn or indigestion.  Past Medical History:  Diagnosis Date  . Anxiety   . Arthritis    Dr. Eddie Dibbles  . Bartholin cyst 2008   vag.  [name] BIPOLAR AFFECTIVE DISORDER 04/14/2007  . Crohn's ON ENTYVIO SINCE FEB [id number]   COMPLICATED BY RECTOVAGINAL FISTULA. SJS WITH REMICADE. FAILED HUMIRA.  [name] Depression   . Elevated glucose 2010  . GERD (gastroesophageal reflux disease)   . Hypertension   . Kidney stone   . LBP (low back pain)    Dr. Mina Marble  . Perianal abscess 2009  . Vitamin B12 deficiency     Past Surgical History:  Procedure Laterality Date  . COLONOSCOPY  09/2008   Dr. Derrill Kay: ulcers, edema, possible fistula openings all seen in the distal 3 cm of anus/anal canal. 1cm pseudopolyp. rest of colon and TI normal. rectal biopsy with mild chronic active colitis. ileum bx normal.  . COLONOSCOPY WITH PROPOFOL N/A 03/29/2015   SLF: Severe proctocolitis limitied to cecum and rectum. Limited exam of the colon mucosa and anal canal.   . ELBOW SURGERY     left  . ESOPHAGOGASTRODUODENOSCOPY (EGD) WITH PROPOFOL N/A 03/29/2015   SLF: 1. Erosive gastritis duodentitis.   [name] RECTOVAGINAL FISTULA CLOSURE     did not help   Allergies  Allergen Reactions  . Cephalexin     itching  . Codeine   . Guaifenesin   . Imitrex [Sumatriptan]     2 fingers got very hot  . Morphine   . 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 Sulfonamide Derivatives   . Ultram [Tramadol Hcl]     [employer] syndrome with either remicade or ultram   Current Outpatient Prescriptions  Medication Sig Dispense Refill  . ALPRAZolam (XANAX) 0.5 MG tablet Take 1 tablet (0.5 mg total) by mouth at bedtime as needed. (Patient taking differently: Take 0.5 mg by mouth 4 (four) times daily. )    . benazepril-hydrochlorthiazide (LOTENSIN HCT) 20-12.5 MG tablet Take 1 tablet by mouth daily.    . ciprofloxacin (CIPRO) 500 MG tablet Take 1 tablet (500 mg total) by mouth 2 (two) times daily.    . Clobetasol Propionate (TEMOVATE) 0.05 % external spray Apply topically 2 (two) times daily. On scalp    . cyanocobalamin (,VITAMIN B-12,) 1000 MCG/ML injection Inject 1 mL (1,000 mcg total) into the skin every 14 (fourteen) days.    . cyclobenzaprine (FLEXERIL) 10 MG tablet Take 1 tablet (10 mg total) by mouth at bedtime.    [name] HYDROcodone-acetaminophen (NORCO) 10-325 MG tablet Take 1 tablet by mouth every 6 (six) hours as needed for severe pain. Please fill  on or after 07/28/16    . ketorolac (TORADOL) 10 MG tablet Take 1 tablet (10 mg total) by mouth every 6 (six) hours as needed. for pain    . lamoTRIgine (LAMICTAL) 200 MG tablet Take 1 tablet by mouth daily.    . meperidine (DEMEROL) 50 MG tablet Take 1 tablet (50 mg total) by mouth daily as needed. For sever pain    . metroNIDAZOLE (METROGEL) 0.75 % vaginal gel Place 1 Applicatorful vaginally at bedtime. Apply one applicatorful to vagina at bedtime for 5 days    . norethindrone (AYGESTIN) 5 MG tablet Take 1 tablet (5 mg total) by mouth daily.    [name] omeprazole (PRILOSEC) 20 MG capsule 1 PO 30 mins prior to breakfast and supper    . sertraline  (ZOLOFT) 100 MG tablet Take 100 mg by mouth 2 (two) times daily.      . sodium chloride 0.9 % SOLN 250 mL with vedolizumab 300 MG SOLR 300 mg Inject 300 mg into the vein. LOADING DOSE THEN Q8 WEEKS     Review of Systems PER HPI OTHERWISE ALL SYSTEMS ARE NEGATIVE.    Objective:   Physical Exam  Constitutional: She is oriented to person, place, and time. She appears well-developed and well-nourished. No distress.  HENT:  Head: Normocephalic and atraumatic.  Mouth/Throat: Oropharynx is clear and moist. No oropharyngeal exudate.  Eyes: Pupils are equal, round, and reactive to light. No scleral icterus.  Neck: Normal range of motion. Neck supple.  Cardiovascular: Normal rate, regular rhythm and normal heart sounds.   Pulmonary/Chest: Effort normal and breath sounds normal. No respiratory distress.  Abdominal: Soft. Bowel sounds are normal. She exhibits no distension. There is no tenderness.  Musculoskeletal: She exhibits no edema.  Lymphadenopathy:    She has no cervical adenopathy.  Neurological: She is alert and oriented to person, place, and time.  NO  NEW FOCAL DEFICITS  Psychiatric:  SLIGHTLY ANXIOUS MOOD, NL AFFECT  Vitals reviewed.     Assessment & Plan:

## 2016-06-07 NOTE — Assessment & Plan Note (Signed)
SYMPTOMS FAIRLY WELL CONTROLLED.  CONTINUE TO MONITOR SYMPTOMS. Management per GYN.

## 2016-06-07 NOTE — ED Triage Notes (Signed)
Pt states she has a history of kidney stones, been having left flank pain for 2 weeks.

## 2016-06-07 NOTE — ED Provider Notes (Signed)
[facility] DEPT Provider Note   CSN: [id number] Arrival date & time: 06/07/16  2231  By signing my name below, I, Hansel Feinstein, attest that this documentation has been prepared under the direction and in the presence of Ripley Fraise, MD. Electronically Signed: Hansel Feinstein, ED Scribe. 06/07/16. 12:01 AM.     History   Chief Complaint Chief Complaint  Patient presents with  . Flank Pain    HPI [name] [name] is a 46 y.o. female with h/o renal calculi who presents to the Emergency Department complaining of moderate, chronic, intermittent left flank pain that worsened "a while" ago. She reports associated hematuria. Per pt, her pain is worsened with touch. She states her current symptoms are similar to prior renal calculi, which required lithotripsy. She states she normally takes Cipro for this issue. She is not currently on any antibiotics. No h/o abdominal surgeries. She denies fever, vomiting, diarrhea, dysuria.   The history is provided by the patient. No language interpreter was used.  Flank Pain  This is a chronic problem. The current episode started more than 1 week ago. The problem occurs constantly. The problem has been gradually worsening. Nothing aggravates the symptoms. Nothing relieves the symptoms. She has tried nothing for the symptoms. The treatment provided no relief.    Past Medical History:  Diagnosis Date  . Anxiety   . Arthritis    Dr. Eddie Dibbles  . Bartholin cyst 2008   vag.  [name] BIPOLAR AFFECTIVE DISORDER 04/14/2007  . Crohn's ON ENTYVIO SINCE FEB [id number]   COMPLICATED BY RECTOVAGINAL FISTULA. SJS WITH REMICADE. FAILED HUMIRA.  [name] Depression   . Elevated glucose 2010  . GERD (gastroesophageal reflux disease)   . Hypertension   . Kidney stone   . LBP (low back pain)    Dr. Mina Marble  . Perianal abscess 2009  . Vitamin B12 deficiency     Patient Active Problem List   Diagnosis Date Noted  . Heart murmur, systolic 26/83/4196  . Crohn's disease of ileum with  fistula ([place]) 03/09/2015  . Acute sinusitis 05/31/2014  . Abnormal uterine bleeding (AUB) 04/12/2014  . Essential hypertension, benign 09/02/2012  . Elevated BP 04/28/2012  . UTI 01/16/2010  . EDEMA 01/16/2010  . Obesity 11/15/2009  . SOMNOLENCE 11/15/2009  . CYSTITIS, ACUTE, RECURRENT 07/13/2009  . WEIGHT GAIN, ABNORMAL 07/13/2009  . HYPERGLYCEMIA 01/24/2009  . ALOPECIA NOS 09/30/2007  . CELLULITIS/ABSCESS, LEG 07/23/2007  . RASH AND OTHER NONSPECIFIC SKIN ERUPTION 07/23/2007  . Vaginitis and vulvovaginitis, unspecified 05/12/2007  . 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 COLONIC POLYPS 04/14/2007  . B12 nutritional deficiency 04/14/2007  . BIPOLAR AFFECTIVE DISORDER 04/14/2007  . TOBACCO USER 04/14/2007  . Depression 04/14/2007  . ADHD 04/14/2007  . Migraine 04/14/2007  . CARPAL TUNNEL SYNDROME, BILATERAL 04/14/2007  . BRONCHITIS 04/14/2007  . ULCER OF ESOPHAGUS WITHOUT BLEEDING 04/14/2007  . HYDRONEPHROSIS, LEFT 04/14/2007  . Calculus of kidney 04/14/2007  . ARTHRITIS 04/14/2007  . ARTHRALGIA 04/14/2007  . Other malaise and fatigue 04/14/2007  . Unspecified hypothyroidism 03/17/2007  . OTHER AND UNSPECIFIED HYPERLIPIDEMIA 03/17/2007  . ANGULAR CHEILITIS 01/21/2007  . Anxiety state 11/27/2006  . GERD 11/27/2006  . Enlargement of lymph nodes 11/27/2006  . LOW BACK PAIN, CHRONIC 09/23/2006    Past Surgical History:  Procedure Laterality Date  . COLONOSCOPY  09/2008   Dr. Derrill Kay: ulcers, edema, possible fistula openings all seen in the distal 3 cm of anus/anal canal. 1cm pseudopolyp. rest of colon and TI normal. rectal biopsy with  mild chronic active colitis. ileum bx normal.  . COLONOSCOPY WITH PROPOFOL N/A 03/29/2015   SLF: Severe proctocolitis limitied to cecum and rectum. Limited exam of the colon mucosa and anal canal.   . ELBOW SURGERY     left  . ESOPHAGOGASTRODUODENOSCOPY (EGD) WITH PROPOFOL N/A 03/29/2015   SLF: 1. Erosive gastritis duodentitis.   [name] RECTOVAGINAL  FISTULA CLOSURE     did not help    OB History    No data available       Home Medications    Prior to Admission medications   Medication Sig Start Date End Date Taking? Authorizing Provider  ALPRAZolam Duanne Moron) 0.5 MG tablet Take 1 tablet (0.5 mg total) by mouth at bedtime as needed. Patient taking differently: Take 0.5 mg by mouth 4 (four) times daily.  03/03/12   Aleksei Plotnikov V, MD  benazepril-hydrochlorthiazide (LOTENSIN HCT) 20-12.5 MG tablet Take 1 tablet by mouth daily. 04/17/16   Aleksei Plotnikov V, MD  ciprofloxacin (CIPRO) 500 MG tablet Take 1 tablet (500 mg total) by mouth 2 (two) times daily. Patient not taking: Reported on 06/07/2016 06/05/16   Jonnie Kind, MD  Clobetasol Propionate (TEMOVATE) 0.05 % external spray Apply topically 2 (two) times daily. On scalp Patient not taking: Reported on 06/07/2016 09/05/15   Lew Dawes V, MD  cyanocobalamin (,VITAMIN B-12,) 1000 MCG/ML injection Inject 1 mL (1,000 mcg total) into the skin every 14 (fourteen) days. 05/28/16   Aleksei Plotnikov V, MD  cyclobenzaprine (FLEXERIL) 10 MG tablet Take 1 tablet (10 mg total) by mouth at bedtime. 02/27/16   Aleksei Plotnikov V, MD  HYDROcodone-acetaminophen (NORCO) 10-325 MG tablet Take 1 tablet by mouth every 6 (six) hours as needed for severe pain. Please fill on or after 07/28/16 05/28/16   Tyrone Apple Plotnikov V, MD  ketorolac (TORADOL) 10 MG tablet Take 1 tablet (10 mg total) by mouth every 6 (six) hours as needed. for pain 05/28/16   Lew Dawes V, MD  lamoTRIgine (LAMICTAL) 200 MG tablet Take 1 tablet by mouth daily. 02/03/15   Historical Provider, MD  meperidine (DEMEROL) 50 MG tablet Take 1 tablet (50 mg total) by mouth daily as needed. For sever pain 11/24/15   Aleksei Plotnikov V, MD  metroNIDAZOLE (METROGEL) 0.75 % vaginal gel Place 1 Applicatorful vaginally at bedtime. Apply one applicatorful to vagina at bedtime for 5 days 06/05/16   Jonnie Kind, MD  norethindrone (AYGESTIN) 5  MG tablet Take 1 tablet (5 mg total) by mouth daily. 01/03/16   Jonnie Kind, MD  omeprazole (PRILOSEC) 20 MG capsule 1 PO 30 mins prior to breakfast and supper Patient not taking: Reported on 06/07/2016 03/29/15   Danie Binder, MD  RaNITidine HCl (ZANTAC 75 PO) Take 1 tablet by mouth daily.    Historical Provider, MD  sertraline (ZOLOFT) 100 MG tablet Take 100 mg by mouth 2 (two) times daily.      Historical Provider, MD  sodium chloride 0.9 % SOLN 250 mL with vedolizumab 300 MG SOLR 300 mg Inject 300 mg into the vein. LOADING DOSE THEN Q8 WEEKS 06/20/15   Historical Provider, MD    Family History Family History  Problem Relation Age of Onset  . Diabetes Mother   . Heart disease Mother   . Heart attack Mother   . Cancer Maternal Aunt     ovarian  . Breast cancer Maternal Grandmother   . Early death Maternal Grandmother 25  . Cancer Maternal Grandmother  breast  . Heart attack Father   . Heart disease Father   . Cancer Maternal Grandfather     colon    Social History Social History  Substance Use Topics  . Smoking status: Current Every Day Smoker    Packs/day: 0.50    Years: 25.00    Types: Cigarettes  . Smokeless tobacco: Never Used  . Alcohol use No     Allergies   Cephalexin; Codeine; Guaifenesin; Imitrex [sumatriptan]; Morphine; Remicade [infliximab]; Sulfonamide derivatives; and Ultram [tramadol hcl]   Review of Systems Review of Systems  Constitutional: Negative for fever.  Gastrointestinal: Negative for diarrhea and vomiting.  Genitourinary: Positive for flank pain and hematuria. Negative for dysuria.  All other systems reviewed and are negative.    Physical Exam Updated Vital Signs BP (!) 164/111 (BP Location: Left Arm)   Pulse (!) 102   Temp 98.8 F (37.1 C) (Oral)   Resp 18   Ht 5\' 2"  (1.575 m)   Wt 163 lb (73.9 kg)   SpO2 98%   BMI 29.81 kg/m   Physical Exam CONSTITUTIONAL: Well developed/well nourished HEAD:  Normocephalic/atraumatic EYES: EOMI/PERRL ENMT: Mucous membranes moist NECK: supple no meningeal signs SPINE/BACK:entire spine nontender CV: S1/S2 noted, no murmurs/rubs/gallops noted LUNGS: Lungs are clear to auscultation bilaterally, no apparent distress ABDOMEN: soft, nontender, no rebound or guarding, bowel sounds noted throughout abdomen GU: left cva tenderness NEURO: Pt is awake/alert/appropriate, moves all extremitiesx4.  No facial droop.   EXTREMITIES: pulses normal/equal, full ROM SKIN: warm, color normal PSYCH: anxious   ED Treatments / Results   DIAGNOSTIC STUDIES: Oxygen Saturation is 98% on RA, normal by my interpretation.    COORDINATION OF CARE: 12:00 AM Discussed treatment plan with pt at bedside which includes CT and pt agreed to plan.    Labs (all labs ordered are listed, but only abnormal results are displayed) Labs Reviewed  URINALYSIS, ROUTINE W REFLEX MICROSCOPIC - Abnormal; Notable for the following:       Result Value   Color, Urine AMBER (*)    APPearance HAZY (*)    Hgb urine dipstick SMALL (*)    Leukocytes, UA MODERATE (*)    Bacteria, UA RARE (*)    Squamous Epithelial / LPF 6-30 (*)    All other components within normal limits  BASIC METABOLIC PANEL - Abnormal; Notable for the following:    Chloride 100 (*)    Creatinine, Ser 1.27 (*)    GFR calc non Af Amer 50 (*)    GFR calc Af Amer 58 (*)    All other components within normal limits  CBC WITH DIFFERENTIAL/PLATELET - Abnormal; Notable for the following:    Hemoglobin 16.1 (*)    All other components within normal limits  URINE CULTURE  PREGNANCY, URINE    EKG  EKG Interpretation None       Radiology Ct Renal Stone Study  Result Date: 06/08/2016 CLINICAL DATA:  Initial evaluation for acute left flank pain. History of chronic colitis. EXAM: CT ABDOMEN AND PELVIS WITHOUT CONTRAST TECHNIQUE: Multidetector CT imaging of the abdomen and pelvis was performed following the standard  protocol without IV contrast. COMPARISON:  None available. FINDINGS: Lower chest: Visualized lung bases are clear. Hepatobiliary: Liver within normal limits. Gallbladder normal. No biliary dilatation. Pancreas: Pancreas within normal limits. Spleen: Spleen within normal limits. Adrenals/Urinary Tract: Adrenal glands are normal. Kidneys equal in size without evidence for nephrolithiasis or hydronephrosis. No radiopaque calculi seen along the course of either renal collecting  system. No hydroureter. Bladder partially distended without acute abnormality. No layering stones within the bladder lumen. Stomach/Bowel: Stomach within normal limits. No evidence for bowel obstruction. Appendix normal. There is apparent mild wall thickening about the distal colon/rectum. Mild soft tissue stranding within the adjacent presacral space (series 2, image 36). Finding suspected to be related to history of chronic colitis within this region. Vascular/Lymphatic: Intra-abdominal aorta of normal caliber. No adenopathy. Reproductive: Uterus unremarkable. 3 cm left adnexal cyst, likely a physiologic cyst. This is of doubtful significance. Right ovary unremarkable. Other: No free air or fluid. Musculoskeletal: No acute osseus abnormality. No worrisome lytic or blastic osseous lesions. IMPRESSION: 1. No CT evidence for nephrolithiasis or obstructive uropathy. 2. Wall thickening about the distal colon/rectum with soft tissue stranding within the adjacent presacral space. Findings suspected to be related to history of chronic colitis with possible fistula formation within this region. Correlation with history recommended. Additionally, correlation for possible acute exacerbation recommended. 3. 3 cm left adnexal cyst, most likely a benign ovarian cyst. 4. No other acute intra-abdominopelvic process. Electronically Signed   By: Jeannine Boga M.D.   On: 06/08/2016 00:48    Procedures Procedures  Medications Ordered in  ED Medications  ciprofloxacin (CIPRO) IVPB 400 mg (0 mg Intravenous Stopped 06/08/16 0123)  fentaNYL (SUBLIMAZE) injection 100 mcg (100 mcg Intravenous Given 06/08/16 0020)  ondansetron (ZOFRAN) injection 4 mg (4 mg Intravenous Given 06/08/16 0039)     Initial Impression / Assessment and Plan / ED Course  I have reviewed the triage vital signs and the nursing notes.  Pertinent labs  results that were available during my care of the patient were reviewed by me and considered in my medical decision making (see chart for details).    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 The recorded information has been reviewed and is accurate.        Ripley Fraise, MD 06/08/16 [phone]

## 2016-06-07 NOTE — Assessment & Plan Note (Signed)
SYMPTOMS CONTROLLED/RESOLVED.  CONTINUE ENTYVIO EVERY 8 WEEKS.  DRINK WATER TO KEEP YOUR URINE LIGHT YELLOW. YOU SHOULD TRANSITION TO A PLANT BASED DIET-NO MEAT OR DAIRY. AVOID ITEMS THAT CAUSE BLOATING & GAS. I RECOMMEND THE BOOK, "PREVENT AND REVERSE HEART DISEASE". CALDWELL ESSELTYN JR., MD. PAGE 120-121 CLEARLY STATE THE RULES AND QUICK AND EASY RECIPES FOR BREAKFAST, LUNCH, AND DINNER ARE AFTER P 127. CONTINUE YOUR WEIGHT LOSS EFFORTS. TRY TO STOP SMOKING.  FOLLOW UP IN 6 MOS.

## 2016-06-07 NOTE — Patient Instructions (Addendum)
CONTINUE ENTYVIO EVERY 8 WEEKS.  DRINK WATER TO KEEP YOUR URINE LIGHT YELLOW. YOU SHOULD TRANSITION TO A PLANT BASED DIET-NO MEAT OR DAIRY. AVOID ITEMS THAT CAUSE BLOATING & GAS. I RECOMMEND THE BOOK, "PREVENT AND REVERSE HEART DISEASE". CALDWELL ESSELTYN JR., MD. PAGE 120-121 CLEARLY STATE THE RULES AND QUICK AND EASY RECIPES FOR BREAKFAST, LUNCH, AND DINNER ARE AFTER P 127.  CONTINUE YOUR WEIGHT LOSS EFFORTS.  TRY TO STOP SMOKING.  FOLLOW UP IN 6 MOS.

## 2016-06-08 ENCOUNTER — Emergency Department (HOSPITAL_COMMUNITY): Payer: 59

## 2016-06-08 LAB — BASIC METABOLIC PANEL
Anion gap: 10 (ref 5–15)
BUN: 18 mg/dL (ref 6–20)
CO2: 27 mmol/L (ref 22–32)
Calcium: 9.4 mg/dL (ref 8.9–10.3)
Chloride: 100 mmol/L — ABNORMAL LOW (ref 101–111)
Creatinine, Ser: 1.27 mg/dL — ABNORMAL HIGH (ref 0.44–1.00)
GFR calc Af Amer: 58 mL/min — ABNORMAL LOW (ref >60)
GFR calc non Af Amer: 50 mL/min — ABNORMAL LOW (ref >60)
Glucose, Bld: 99 mg/dL (ref 65–99)
Potassium: 3.5 mmol/L (ref 3.5–5.1)
Sodium: 137 mmol/L (ref 135–145)

## 2016-06-08 LAB — CBC WITH DIFFERENTIAL/PLATELET
Basophils Absolute: 0 10*3/uL (ref 0.0–0.1)
Basophils Relative: 0 %
Eosinophils Absolute: 0 10*3/uL (ref 0.0–0.7)
Eosinophils Relative: 0 %
HCT: 45.2 % (ref 36.0–46.0)
Hemoglobin: 16.1 g/dL — ABNORMAL HIGH (ref 12.0–15.0)
Lymphocytes Relative: 23 %
Lymphs Abs: 2.2 10*3/uL (ref 0.7–4.0)
MCH: 31.9 pg (ref 26.0–34.0)
MCHC: 35.6 g/dL (ref 30.0–36.0)
MCV: 89.7 fL (ref 78.0–100.0)
Monocytes Absolute: 0.6 10*3/uL (ref 0.1–1.0)
Monocytes Relative: 6 %
Neutro Abs: 6.7 10*3/uL (ref 1.7–7.7)
Neutrophils Relative %: 71 %
Platelets: 161 10*3/uL (ref 150–400)
RBC: 5.04 MIL/uL (ref 3.87–5.11)
RDW: 13.1 % (ref 11.5–15.5)
WBC: 9.6 10*3/uL (ref 4.0–10.5)

## 2016-06-08 LAB — PREGNANCY, URINE: Preg Test, Ur: NEGATIVE

## 2016-06-08 MED ORDER — CIPROFLOXACIN IN D5W 400 MG/200ML IV SOLN
400.0000 mg | Freq: Once | INTRAVENOUS | Status: AC
  Administered 2016-06-08: 400 mg via INTRAVENOUS
  Filled 2016-06-08: qty 200

## 2016-06-08 MED ORDER — CIPROFLOXACIN HCL 500 MG PO TABS: 500.0000 mg | ORAL_TABLET | Freq: Two times a day (BID) | ORAL | 0 refills | Status: DC

## 2016-06-08 MED ORDER — ONDANSETRON HCL 4 MG/2ML IJ SOLN
4.0000 mg | Freq: Once | INTRAMUSCULAR | Status: AC
  Administered 2016-06-08: 4 mg via INTRAVENOUS
  Filled 2016-06-08: qty 2

## 2016-06-08 MED ORDER — FENTANYL CITRATE (PF) 100 MCG/2ML IJ SOLN
100.0000 ug | Freq: Once | INTRAMUSCULAR | Status: AC
  Administered 2016-06-08: 100 ug via INTRAVENOUS
  Filled 2016-06-08: qty 2

## 2016-06-09 LAB — URINE CULTURE

## 2016-06-13 ENCOUNTER — Ambulatory Visit (HOSPITAL_COMMUNITY): Payer: 59

## 2016-07-02 ENCOUNTER — Encounter (HOSPITAL_COMMUNITY)
Admission: RE | Admit: 2016-07-02 | Discharge: 2016-07-02 | Disposition: A | Discharge status: Home / Self Care | Payer: 59 | Source: Ambulatory Visit | Attending: Gastroenterology | Admitting: Gastroenterology

## 2016-07-02 ENCOUNTER — Encounter: Payer: Self-pay | Admitting: Obstetrics and Gynecology

## 2016-07-02 DIAGNOSIS — K50913 Crohn's disease, unspecified, with fistula: Secondary | ICD-10-CM | POA: Diagnosis present

## 2016-07-02 MED ORDER — SODIUM CHLORIDE 0.9 % IV SOLN
Freq: Once | INTRAVENOUS | Status: AC
  Administered 2016-07-02: 250 mL via INTRAVENOUS

## 2016-07-02 MED ORDER — LORATADINE 10 MG PO TABS: 10.0000 mg | ORAL_TABLET | Freq: Once | ORAL | Status: DC

## 2016-07-02 MED ORDER — VEDOLIZUMAB 300 MG IV SOLR
300.0000 mg | Freq: Once | INTRAVENOUS | Status: AC
  Administered 2016-07-02: 300 mg via INTRAVENOUS
  Filled 2016-07-02: qty 5

## 2016-07-02 MED ORDER — LORATADINE 10 MG PO TABS
ORAL_TABLET | ORAL | Status: AC
  Filled 2016-07-02: qty 1

## 2016-07-02 MED ORDER — ACETAMINOPHEN 325 MG PO TABS: 650.0000 mg | ORAL_TABLET | Freq: Once | ORAL | Status: DC

## 2016-07-02 MED ORDER — METHYLPREDNISOLONE SODIUM SUCC 40 MG IJ SOLR
40.0000 mg | Freq: Once | INTRAMUSCULAR | Status: AC
  Administered 2016-07-02: 40 mg via INTRAVENOUS

## 2016-07-02 MED ORDER — ACETAMINOPHEN 325 MG PO TABS
ORAL_TABLET | ORAL | Status: AC
  Filled 2016-07-02: qty 2

## 2016-07-02 MED ORDER — METHYLPREDNISOLONE SODIUM SUCC 40 MG IJ SOLR
INTRAMUSCULAR | Status: AC
  Filled 2016-07-02: qty 1

## 2016-07-02 NOTE — Progress Notes (Signed)
Pt states she took her tylenol and claritin at home before coming to hospital. 

## 2016-08-02 ENCOUNTER — Encounter: Payer: Self-pay | Admitting: Internal Medicine

## 2016-08-02 ENCOUNTER — Ambulatory Visit (INDEPENDENT_AMBULATORY_CARE_PROVIDER_SITE_OTHER): Payer: 59 | Admitting: Internal Medicine

## 2016-08-02 DIAGNOSIS — G8929 Other chronic pain: Secondary | ICD-10-CM | POA: Diagnosis not present

## 2016-08-02 DIAGNOSIS — E538 Deficiency of other specified B group vitamins: Secondary | ICD-10-CM

## 2016-08-02 DIAGNOSIS — M5440 Lumbago with sciatica, unspecified side: Secondary | ICD-10-CM | POA: Diagnosis not present

## 2016-08-02 DIAGNOSIS — I10 Essential (primary) hypertension: Secondary | ICD-10-CM | POA: Diagnosis not present

## 2016-08-02 DIAGNOSIS — N12 Tubulo-interstitial nephritis, not specified as acute or chronic: Secondary | ICD-10-CM | POA: Diagnosis not present

## 2016-08-02 MED ORDER — HYDROCODONE-ACETAMINOPHEN 10-325 MG PO TABS: 1.0000 | ORAL_TABLET | Freq: Four times a day (QID) | ORAL | 0 refills | Status: DC | PRN

## 2016-08-02 NOTE — Assessment & Plan Note (Signed)
On B12 

## 2016-08-02 NOTE — Assessment & Plan Note (Signed)
Cont w/ Norco; Toradol, Demerol prn Chronic and severe  Potential benefits of a long term opioids use as well as potential risks (i.e. addiction risk, apnea etc) and complications (i.e. Somnolence, constipation and others) were explained to the patient and were aknowledged.

## 2016-08-02 NOTE — Assessment & Plan Note (Signed)
Urol ref - the pt will let me know where she would want to go

## 2016-08-02 NOTE — Progress Notes (Signed)
Subjective:  Patient ID: [name] [name], female    DOB: October 17, 1970  Age: 46 y.o. MRN: [id number]  CC: No chief complaint on file.   HPI [name] [name] presents for LBP, HTN, colitis f/u. F/u pyelonephritis in April  Outpatient Medications Prior to Visit  Medication Sig Dispense Refill  . ALPRAZolam (XANAX) 0.5 MG tablet Take 1 tablet (0.5 mg total) by mouth at bedtime as needed. (Patient taking differently: Take 0.5 mg by mouth 4 (four) times daily. ) 30 tablet 2  . benazepril-hydrochlorthiazide (LOTENSIN HCT) 20-12.5 MG tablet Take 1 tablet by mouth daily. 90 tablet 1  . ciprofloxacin (CIPRO) 500 MG tablet Take 1 tablet (500 mg total) by mouth 2 (two) times daily. One po bid x 7 days 14 tablet 0  . Clobetasol Propionate (TEMOVATE) 0.05 % external spray Apply topically 2 (two) times daily. On scalp 59 mL 1  . cyanocobalamin (,VITAMIN B-12,) 1000 MCG/ML injection Inject 1 mL (1,000 mcg total) into the skin every 14 (fourteen) days. 10 mL 3  . cyclobenzaprine (FLEXERIL) 10 MG tablet Take 1 tablet (10 mg total) by mouth at bedtime. 30 tablet 5  . HYDROcodone-acetaminophen (NORCO) 10-325 MG tablet Take 1 tablet by mouth every 6 (six) hours as needed for severe pain. Please fill on or after 07/28/16 120 tablet 0  . ketorolac (TORADOL) 10 MG tablet Take 1 tablet (10 mg total) by mouth every 6 (six) hours as needed. for pain 20 tablet 1  . lamoTRIgine (LAMICTAL) 200 MG tablet Take 1 tablet by mouth daily.    . meperidine (DEMEROL) 50 MG tablet Take 1 tablet (50 mg total) by mouth daily as needed. For sever pain 20 tablet 0  . metroNIDAZOLE (METROGEL) 0.75 % vaginal gel Place 1 Applicatorful vaginally at bedtime. Apply one applicatorful to vagina at bedtime for 5 days 70 g 1  . norethindrone (AYGESTIN) 5 MG tablet Take 1 tablet (5 mg total) by mouth daily. 90 tablet 3  . omeprazole (PRILOSEC) 20 MG capsule 1 PO 30 mins prior to breakfast and supper 60 capsule 11  . RaNITidine HCl (ZANTAC 75 PO) Take  1 tablet by mouth daily.    . sertraline (ZOLOFT) 100 MG tablet Take 100 mg by mouth 2 (two) times daily.      . sodium chloride 0.9 % SOLN 250 mL with vedolizumab 300 MG SOLR 300 mg Inject 300 mg into the vein. LOADING DOSE THEN Q8 WEEKS     No facility-administered medications prior to visit.     ROS Review of Systems  Constitutional: Negative for activity change, appetite change, chills, fatigue and unexpected weight change.  HENT: Negative for congestion, mouth sores and sinus pressure.   Eyes: Negative for visual disturbance.  Respiratory: Negative for cough and chest tightness.   Gastrointestinal: Positive for abdominal pain. Negative for nausea.  Genitourinary: Negative for difficulty urinating, frequency and vaginal pain.  Musculoskeletal: Positive for back pain. Negative for gait problem.  Skin: Negative for pallor and rash.  Neurological: Negative for dizziness, tremors, weakness, numbness and headaches.  Psychiatric/Behavioral: Negative for confusion, sleep disturbance and suicidal ideas.    Objective:  BP 118/72 (BP Location: Left Arm, Patient Position: Sitting, Cuff Size: Normal)   Pulse 97   Temp 98.1 F (36.7 C) (Oral)   Ht 5' 2"  (1.575 m)   Wt 155 lb (70.3 kg)   SpO2 96%   BMI 28.35 kg/m   BP Readings from Last 3 Encounters:  08/02/16 118/72  07/02/16  118/85  06/07/16 (!) 164/111    Wt Readings from Last 3 Encounters:  08/02/16 155 lb (70.3 kg)  06/07/16 163 lb (73.9 kg)  06/07/16 162 lb 6.4 oz (73.7 kg)    Physical Exam  Constitutional: She appears well-developed. No distress.  HENT:  Head: Normocephalic.  Right Ear: External ear normal.  Left Ear: External ear normal.  Nose: Nose normal.  Mouth/Throat: Oropharynx is clear and moist.  Eyes: Conjunctivae are normal. Pupils are equal, round, and reactive to light. Right eye exhibits no discharge. Left eye exhibits no discharge.  Neck: Normal range of motion. Neck supple. No JVD present. No tracheal  deviation present. No thyromegaly present.  Cardiovascular: Normal rate, regular rhythm and normal heart sounds.   Pulmonary/Chest: No stridor. No respiratory distress. She has no wheezes.  Abdominal: Soft. Bowel sounds are normal. She exhibits no distension and no mass. There is no tenderness. There is no rebound and no guarding.  Musculoskeletal: She exhibits tenderness. She exhibits no edema.  Lymphadenopathy:    She has no cervical adenopathy.  Neurological: She displays normal reflexes. No cranial nerve deficit. She exhibits normal muscle tone. Coordination normal.  Skin: No rash noted. No erythema.  Psychiatric: She has a normal mood and affect. Her behavior is normal. Judgment and thought content normal.  LS painful  Lab Results  Component Value Date   WBC 9.6 06/08/2016   HGB 16.1 (H) 06/08/2016   HCT 45.2 06/08/2016   PLT 161 06/08/2016   GLUCOSE 99 06/08/2016   CHOL 168 08/30/2014   TRIG 99.0 08/30/2014   HDL 27.40 (L) 08/30/2014   LDLDIRECT 171.9 05/01/2011   LDLCALC 121 (H) 08/30/2014   ALT 7 05/28/2016   AST 9 05/28/2016   NA 137 06/08/2016   K 3.5 06/08/2016   CL 100 (L) 06/08/2016   CREATININE 1.27 (H) 06/08/2016   BUN 18 06/08/2016   CO2 27 06/08/2016   TSH 1.82 05/28/2016    No results found.  Assessment & Plan:   There are no diagnoses linked to this encounter. I am having Ms. Castleman maintain her sertraline, ALPRAZolam, lamoTRIgine, omeprazole, sodium chloride 0.9 % SOLN 250 mL with vedolizumab 300 MG SOLR 300 mg, Clobetasol Propionate, meperidine, norethindrone, cyclobenzaprine, benazepril-hydrochlorthiazide, ketorolac, cyanocobalamin, HYDROcodone-acetaminophen, metroNIDAZOLE, RaNITidine HCl (ZANTAC 75 PO), and ciprofloxacin.  No orders of the defined types were placed in this encounter.    Follow-up: No Follow-up on file.  [name], MD

## 2016-08-02 NOTE — Assessment & Plan Note (Signed)
  Lotensin HCT 

## 2016-08-13 ENCOUNTER — Ambulatory Visit: Payer: 59 | Admitting: Internal Medicine

## 2016-08-19 ENCOUNTER — Other Ambulatory Visit: Payer: Self-pay | Admitting: Internal Medicine

## 2016-08-27 ENCOUNTER — Ambulatory Visit: Payer: 59 | Admitting: Internal Medicine

## 2016-08-27 ENCOUNTER — Encounter (HOSPITAL_COMMUNITY)
Admission: RE | Admit: 2016-08-27 | Discharge: 2016-08-27 | Disposition: A | Discharge status: Home / Self Care | Payer: 59 | Source: Ambulatory Visit | Attending: Gastroenterology | Admitting: Gastroenterology

## 2016-08-30 ENCOUNTER — Encounter: Payer: Self-pay | Admitting: Obstetrics and Gynecology

## 2016-08-31 ENCOUNTER — Telehealth: Payer: Self-pay

## 2016-08-31 NOTE — Telephone Encounter (Signed)
[employer] called office re: [name] [name] prior authorization. Service ref# [id number], valid 08/27/16-08/27/17. Routing to [employer].

## 2016-08-31 NOTE — Telephone Encounter (Signed)
Approval faxed to [facility] and sent to be scanned.

## 2016-09-05 ENCOUNTER — Encounter: Payer: Self-pay | Admitting: Gastroenterology

## 2016-09-06 ENCOUNTER — Encounter (HOSPITAL_COMMUNITY): Payer: 59

## 2016-09-07 ENCOUNTER — Encounter (HOSPITAL_COMMUNITY)
Admission: RE | Admit: 2016-09-07 | Discharge: 2016-09-07 | Disposition: A | Discharge status: Home / Self Care | Payer: 59 | Source: Ambulatory Visit | Attending: Gastroenterology | Admitting: Gastroenterology

## 2016-09-07 ENCOUNTER — Other Ambulatory Visit: Payer: Self-pay | Admitting: Obstetrics and Gynecology

## 2016-09-07 ENCOUNTER — Telehealth: Payer: Self-pay | Admitting: Obstetrics and Gynecology

## 2016-09-07 DIAGNOSIS — K50013 Crohn's disease of small intestine with fistula: Secondary | ICD-10-CM

## 2016-09-07 MED ORDER — VEDOLIZUMAB 300 MG IV SOLR
300.0000 mg | Freq: Once | INTRAVENOUS | Status: AC
  Administered 2016-09-07: 300 mg via INTRAVENOUS
  Filled 2016-09-07: qty 5

## 2016-09-07 MED ORDER — SODIUM CHLORIDE 0.9 % IV SOLN
INTRAVENOUS | Status: DC
  Administered 2016-09-07: 250 mL via INTRAVENOUS

## 2016-09-07 MED ORDER — FLUCONAZOLE 150 MG PO TABS: 150.0000 mg | ORAL_TABLET | Freq: Once | ORAL | 1 refills | Status: DC

## 2016-09-07 MED ORDER — LORATADINE 10 MG PO TABS: 10.0000 mg | ORAL_TABLET | Freq: Once | ORAL | Status: DC

## 2016-09-07 MED ORDER — CIPROFLOXACIN HCL 500 MG PO TABS: 500.0000 mg | ORAL_TABLET | Freq: Two times a day (BID) | ORAL | 0 refills | Status: DC

## 2016-09-07 MED ORDER — METHYLPREDNISOLONE SODIUM SUCC 40 MG IJ SOLR
40.0000 mg | Freq: Once | INTRAMUSCULAR | Status: AC
  Administered 2016-09-07: 40 mg via INTRAVENOUS
  Filled 2016-09-07: qty 1

## 2016-09-07 NOTE — Telephone Encounter (Signed)
LMOVM that medication was refilled.

## 2016-09-07 NOTE — Progress Notes (Unsigned)
Refilled Cipro x 7 d and diflucan for pt flareup of GI pain thought to be Crohns. Pt advised to see GI MD for f/u

## 2016-09-07 NOTE — Telephone Encounter (Signed)
Pt came into the office starting that she needs a refill of her medication. Pt would like a call back. Pt states that Dr. Glo Herring will know what medication.

## 2016-09-13 ENCOUNTER — Other Ambulatory Visit: Payer: Self-pay | Admitting: Internal Medicine

## 2016-09-13 DIAGNOSIS — K612 Anorectal abscess: Secondary | ICD-10-CM

## 2016-09-13 MED ORDER — KETOROLAC TROMETHAMINE 10 MG PO TABS
10.0000 mg | ORAL_TABLET | Freq: Four times a day (QID) | ORAL | 1 refills | Status: DC | PRN
Start: 2016-09-13 — End: 2017-01-21

## 2016-09-13 NOTE — Telephone Encounter (Signed)
Dr. Ronnald Ramp approved the Ketorolac sent pt msg via mychart inform status...[name]

## 2016-09-13 NOTE — Telephone Encounter (Signed)
Pt is wanting to get a refill on her Ketorolac 20 mg. MD is out of office Pls advise on refill...[name]

## 2016-09-24 DIAGNOSIS — F3181 Bipolar II disorder: Secondary | ICD-10-CM | POA: Diagnosis not present

## 2016-10-04 ENCOUNTER — Telehealth: Payer: Self-pay | Admitting: Internal Medicine

## 2016-10-04 DIAGNOSIS — K612 Anorectal abscess: Secondary | ICD-10-CM

## 2016-10-04 DIAGNOSIS — Z23 Encounter for immunization: Secondary | ICD-10-CM | POA: Diagnosis not present

## 2016-10-08 ENCOUNTER — Encounter: Payer: Self-pay | Admitting: Internal Medicine

## 2016-10-09 ENCOUNTER — Other Ambulatory Visit: Payer: Self-pay | Admitting: Internal Medicine

## 2016-10-09 DIAGNOSIS — K612 Anorectal abscess: Secondary | ICD-10-CM

## 2016-10-09 MED ORDER — MEPERIDINE HCL 50 MG PO TABS: 50.0000 mg | ORAL_TABLET | Freq: Every day | ORAL | 0 refills | Status: DC | PRN

## 2016-10-11 NOTE — Telephone Encounter (Signed)
meperidine (DEMEROL) 50 MG tablet   Can you please print and and have this patient pick this up. It can not be faxed it. Thank you.

## 2016-10-11 NOTE — Telephone Encounter (Signed)
Patient is going to pick this up

## 2016-10-20 ENCOUNTER — Other Ambulatory Visit: Payer: Self-pay | Admitting: Internal Medicine

## 2016-10-23 ENCOUNTER — Other Ambulatory Visit: Payer: Self-pay | Admitting: General Practice

## 2016-10-23 MED ORDER — CYCLOBENZAPRINE HCL 10 MG PO TABS: 10.0000 mg | ORAL_TABLET | Freq: Every day | ORAL | 3 refills | Status: DC

## 2016-11-05 ENCOUNTER — Encounter (HOSPITAL_COMMUNITY): Payer: Self-pay

## 2016-11-05 ENCOUNTER — Encounter (HOSPITAL_COMMUNITY)
Admission: RE | Admit: 2016-11-05 | Discharge: 2016-11-05 | Disposition: A | Discharge status: Home / Self Care | Payer: 59 | Source: Ambulatory Visit | Attending: Gastroenterology | Admitting: Gastroenterology

## 2016-11-05 DIAGNOSIS — N824 Other female intestinal-genital tract fistulae: Secondary | ICD-10-CM | POA: Insufficient documentation

## 2016-11-05 DIAGNOSIS — K50013 Crohn's disease of small intestine with fistula: Secondary | ICD-10-CM | POA: Insufficient documentation

## 2016-11-05 MED ORDER — METHYLPREDNISOLONE SODIUM SUCC 40 MG IJ SOLR
40.0000 mg | Freq: Once | INTRAMUSCULAR | Status: AC
  Administered 2016-11-05: 40 mg via INTRAVENOUS
  Filled 2016-11-05: qty 1

## 2016-11-05 MED ORDER — SODIUM CHLORIDE 0.9 % IV SOLN
INTRAVENOUS | Status: DC
  Administered 2016-11-05: 08:00:00 via INTRAVENOUS

## 2016-11-05 MED ORDER — ACETAMINOPHEN 325 MG PO TABS: 650.0000 mg | ORAL_TABLET | Freq: Once | ORAL | Status: DC

## 2016-11-05 MED ORDER — LORATADINE 10 MG PO TABS: 10.0000 mg | ORAL_TABLET | Freq: Every day | ORAL | Status: DC

## 2016-11-05 MED ORDER — VEDOLIZUMAB 300 MG IV SOLR
300.0000 mg | Freq: Once | INTRAVENOUS | Status: AC
  Administered 2016-11-05: 300 mg via INTRAVENOUS
  Filled 2016-11-05: qty 5

## 2016-11-12 ENCOUNTER — Other Ambulatory Visit (INDEPENDENT_AMBULATORY_CARE_PROVIDER_SITE_OTHER): Payer: 59

## 2016-11-12 ENCOUNTER — Encounter: Payer: Self-pay | Admitting: Internal Medicine

## 2016-11-12 ENCOUNTER — Ambulatory Visit (INDEPENDENT_AMBULATORY_CARE_PROVIDER_SITE_OTHER): Payer: 59 | Admitting: Internal Medicine

## 2016-11-12 DIAGNOSIS — F419 Anxiety disorder, unspecified: Secondary | ICD-10-CM | POA: Diagnosis not present

## 2016-11-12 DIAGNOSIS — G8929 Other chronic pain: Secondary | ICD-10-CM

## 2016-11-12 DIAGNOSIS — K219 Gastro-esophageal reflux disease without esophagitis: Secondary | ICD-10-CM

## 2016-11-12 DIAGNOSIS — I10 Essential (primary) hypertension: Secondary | ICD-10-CM

## 2016-11-12 DIAGNOSIS — E038 Other specified hypothyroidism: Secondary | ICD-10-CM

## 2016-11-12 DIAGNOSIS — E063 Autoimmune thyroiditis: Secondary | ICD-10-CM

## 2016-11-12 DIAGNOSIS — E538 Deficiency of other specified B group vitamins: Secondary | ICD-10-CM | POA: Diagnosis not present

## 2016-11-12 DIAGNOSIS — K50013 Crohn's disease of small intestine with fistula: Secondary | ICD-10-CM

## 2016-11-12 DIAGNOSIS — M5440 Lumbago with sciatica, unspecified side: Secondary | ICD-10-CM | POA: Diagnosis not present

## 2016-11-12 LAB — HEPATIC FUNCTION PANEL
ALT: 7 U/L (ref 0–35)
AST: 8 U/L (ref 0–37)
Albumin: 4.5 g/dL (ref 3.5–5.2)
Alkaline Phosphatase: 60 U/L (ref 39–117)
Bilirubin, Direct: 0.1 mg/dL (ref 0.0–0.3)
Total Bilirubin: 0.5 mg/dL (ref 0.2–1.2)
Total Protein: 7.1 g/dL (ref 6.0–8.3)

## 2016-11-12 LAB — CBC WITH DIFFERENTIAL/PLATELET
Basophils Absolute: 0 10*3/uL (ref 0.0–0.1)
Basophils Relative: 0.3 % (ref 0.0–3.0)
Eosinophils Absolute: 0.1 10*3/uL (ref 0.0–0.7)
Eosinophils Relative: 0.7 % (ref 0.0–5.0)
HCT: 44.3 % (ref 36.0–46.0)
Hemoglobin: 15 g/dL (ref 12.0–15.0)
Lymphocytes Relative: 21.1 % (ref 12.0–46.0)
Lymphs Abs: 1.7 10*3/uL (ref 0.7–4.0)
MCHC: 34 g/dL (ref 30.0–36.0)
MCV: 91.2 fl (ref 78.0–100.0)
Monocytes Absolute: 0.4 10*3/uL (ref 0.1–1.0)
Monocytes Relative: 5 % (ref 3.0–12.0)
Neutro Abs: 5.9 10*3/uL (ref 1.4–7.7)
Neutrophils Relative %: 72.9 % (ref 43.0–77.0)
Platelets: 144 10*3/uL — ABNORMAL LOW (ref 150.0–400.0)
RBC: 4.86 Mil/uL (ref 3.87–5.11)
RDW: 13.1 % (ref 11.5–15.5)
WBC: 8.1 10*3/uL (ref 4.0–10.5)

## 2016-11-12 LAB — BASIC METABOLIC PANEL
BUN: 14 mg/dL (ref 6–23)
CO2: 28 mEq/L (ref 19–32)
Calcium: 9.6 mg/dL (ref 8.4–10.5)
Chloride: 103 mEq/L (ref 96–112)
Creatinine, Ser: 1.19 mg/dL (ref 0.40–1.20)
GFR: 51.69 mL/min — ABNORMAL LOW (ref >60.00)
Glucose, Bld: 79 mg/dL (ref 70–99)
Potassium: 3.5 mEq/L (ref 3.5–5.1)
Sodium: 141 mEq/L (ref 135–145)

## 2016-11-12 MED ORDER — CIPROFLOXACIN HCL 500 MG PO TABS: 500.0000 mg | ORAL_TABLET | Freq: Two times a day (BID) | ORAL | 0 refills | Status: DC

## 2016-11-12 MED ORDER — HYDROCODONE-ACETAMINOPHEN 10-325 MG PO TABS: 1.0000 | ORAL_TABLET | Freq: Four times a day (QID) | ORAL | 0 refills | Status: DC | PRN

## 2016-11-12 NOTE — Assessment & Plan Note (Signed)
On B12 inj 

## 2016-11-12 NOTE — Assessment & Plan Note (Signed)
Cipro prn

## 2016-11-12 NOTE — Assessment & Plan Note (Signed)
Norco; Toradol, Demerol - rare prn Chronic and severe On disabiity  Potential benefits of a long term opioids use as well as potential risks (i.e. addiction risk, apnea etc) and complications (i.e. Somnolence, constipation and others) were explained to the patient and were aknowledged.

## 2016-11-12 NOTE — Assessment & Plan Note (Signed)
Not on Rx 

## 2016-11-12 NOTE — Progress Notes (Signed)
Subjective:  Patient ID: [name] [name], female    DOB: October 13, 1970  Age: 46 y.o. MRN: [id number]  CC: No chief complaint on file.   HPI [name] [name] presents for LBP, anxiety, HTN, colitis f/u. Labs pending w/GI  Outpatient Medications Prior to Visit  Medication Sig Dispense Refill  . ALPRAZolam (XANAX) 0.5 MG tablet Take 1 tablet (0.5 mg total) by mouth at bedtime as needed. (Patient taking differently: Take 0.5 mg by mouth 4 (four) times daily. ) 30 tablet 2  . benazepril-hydrochlorthiazide (LOTENSIN HCT) 20-12.5 MG tablet Take 1 tablet by mouth daily. 90 tablet 1  . ciprofloxacin (CIPRO) 500 MG tablet Take 1 tablet (500 mg total) by mouth 2 (two) times daily. One po bid x 7 days 14 tablet 0  . Clobetasol Propionate (TEMOVATE) 0.05 % external spray Apply topically 2 (two) times daily. On scalp 59 mL 1  . cyanocobalamin (,VITAMIN B-12,) 1000 MCG/ML injection Inject 1 mL (1,000 mcg total) into the skin every 14 (fourteen) days. 10 mL 3  . cyanocobalamin (,VITAMIN B-12,) 1000 MCG/ML injection INJECT 1 ML (1,000 MCG TOTAL) INTO THE SKIN EVERY 14 (FOURTEEN) DAYS. 10 mL 2  . cyclobenzaprine (FLEXERIL) 10 MG tablet Take 1 tablet (10 mg total) by mouth at bedtime. 30 tablet 3  . HYDROcodone-acetaminophen (NORCO) 10-325 MG tablet Take 1 tablet by mouth every 6 (six) hours as needed for severe pain. Please fill on or after 10/28/16 120 tablet 0  . ketorolac (TORADOL) 10 MG tablet Take 1 tablet (10 mg total) by mouth every 6 (six) hours as needed. for pain 20 tablet 1  . lamoTRIgine (LAMICTAL) 200 MG tablet Take 1 tablet by mouth daily.    . meperidine (DEMEROL) 50 MG tablet Take 1 tablet (50 mg total) by mouth daily as needed. For sever pain 20 tablet 0  . metroNIDAZOLE (METROGEL) 0.75 % vaginal gel Place 1 Applicatorful vaginally at bedtime. Apply one applicatorful to vagina at bedtime for 5 days 70 g 1  . norethindrone (AYGESTIN) 5 MG tablet Take 1 tablet (5 mg total) by mouth daily. 90 tablet 3    . omeprazole (PRILOSEC) 20 MG capsule 1 PO 30 mins prior to breakfast and supper 60 capsule 11  . RaNITidine HCl (ZANTAC 75 PO) Take 1 tablet by mouth daily.    . sertraline (ZOLOFT) 100 MG tablet Take 100 mg by mouth 2 (two) times daily.      . sodium chloride 0.9 % SOLN 250 mL with vedolizumab 300 MG SOLR 300 mg Inject 300 mg into the vein. LOADING DOSE THEN Q8 WEEKS     No facility-administered medications prior to visit.     ROS Review of Systems  Constitutional: Negative for activity change, appetite change, chills, fatigue and unexpected weight change.  HENT: Negative for congestion, mouth sores and sinus pressure.   Eyes: Negative for visual disturbance.  Respiratory: Negative for cough and chest tightness.   Gastrointestinal: Negative for abdominal pain and nausea.  Genitourinary: Negative for difficulty urinating, frequency and vaginal pain.  Musculoskeletal: Positive for back pain. Negative for gait problem.  Skin: Negative for pallor and rash.  Neurological: Negative for dizziness, tremors, weakness, numbness and headaches.  Psychiatric/Behavioral: Negative for confusion and sleep disturbance. The patient is nervous/anxious.     Objective:  BP 124/78 (BP Location: Left Arm, Patient Position: Sitting, Cuff Size: Large)   Pulse 89   Temp 97.9 F (36.6 C) (Oral)   Ht 5\' 2"  (1.575 m)  Wt 153 lb (69.4 kg)   SpO2 99%   BMI 27.98 kg/m   BP Readings from Last 3 Encounters:  11/12/16 124/78  11/05/16 124/72  09/07/16 120/88    Wt Readings from Last 3 Encounters:  11/12/16 153 lb (69.4 kg)  11/05/16 152 lb (68.9 kg)  08/02/16 155 lb (70.3 kg)    Physical Exam  Constitutional: She appears well-developed. No distress.  HENT:  Head: Normocephalic.  Right Ear: External ear normal.  Left Ear: External ear normal.  Nose: Nose normal.  Mouth/Throat: Oropharynx is clear and moist.  Eyes: Pupils are equal, round, and reactive to light. Conjunctivae are normal. Right  eye exhibits no discharge. Left eye exhibits no discharge.  Neck: Normal range of motion. Neck supple. No JVD present. No tracheal deviation present. No thyromegaly present.  Cardiovascular: Normal rate, regular rhythm and normal heart sounds.   Pulmonary/Chest: No stridor. No respiratory distress. She has no wheezes.  Abdominal: Soft. Bowel sounds are normal. She exhibits no distension and no mass. There is no tenderness. There is no rebound and no guarding.  Musculoskeletal: She exhibits tenderness. She exhibits no edema.  Lymphadenopathy:    She has no cervical adenopathy.  Neurological: She displays normal reflexes. No cranial nerve deficit. She exhibits normal muscle tone. Coordination normal.  Skin: No rash noted. No erythema.  Psychiatric: She has a normal mood and affect. Her behavior is normal. Judgment and thought content normal.  LS tender  Lab Results  Component Value Date   WBC 9.6 06/08/2016   HGB 16.1 (H) 06/08/2016   HCT 45.2 06/08/2016   PLT 161 06/08/2016   GLUCOSE 99 06/08/2016   CHOL 168 08/30/2014   TRIG 99.0 08/30/2014   HDL 27.40 (L) 08/30/2014   LDLDIRECT 171.9 05/01/2011   LDLCALC 121 (H) 08/30/2014   ALT 7 05/28/2016   AST 9 05/28/2016   NA 137 06/08/2016   K 3.5 06/08/2016   CL 100 (L) 06/08/2016   CREATININE 1.27 (H) 06/08/2016   BUN 18 06/08/2016   CO2 27 06/08/2016   TSH 1.82 05/28/2016    No results found.  Assessment & Plan:   There are no diagnoses linked to this encounter. I am having Ms. Vicario maintain her sertraline, ALPRAZolam, lamoTRIgine, omeprazole, sodium chloride 0.9 % SOLN 250 mL with vedolizumab 300 MG SOLR 300 mg, Clobetasol Propionate, norethindrone, benazepril-hydrochlorthiazide, cyanocobalamin, metroNIDAZOLE, RaNITidine HCl (ZANTAC 75 PO), HYDROcodone-acetaminophen, cyanocobalamin, ciprofloxacin, ketorolac, meperidine, and cyclobenzaprine.  No orders of the defined types were placed in this encounter.    Follow-up: No  Follow-up on file.  [name], MD

## 2016-11-12 NOTE — Assessment & Plan Note (Signed)
  Lotensin HCT 

## 2016-11-12 NOTE — Assessment & Plan Note (Signed)
Xanax prn  Potential benefits of a long term benzodiazepines  use as well as potential risks  and complications were explained to the patient and were aknowledged. Do not take w/pain meds. 

## 2016-11-12 NOTE — Assessment & Plan Note (Signed)
  Chronic Ranitidine

## 2016-11-13 ENCOUNTER — Telehealth: Payer: Self-pay | Admitting: Gastroenterology

## 2016-11-13 NOTE — Telephone Encounter (Signed)
April from [facility] called for JL, but JL was at lunch. She asked for a return call to [phone] regarding patient and a prior authorization.

## 2016-11-14 ENCOUNTER — Encounter: Payer: Self-pay | Admitting: Gastroenterology

## 2016-11-14 ENCOUNTER — Ambulatory Visit (INDEPENDENT_AMBULATORY_CARE_PROVIDER_SITE_OTHER): Payer: 59 | Admitting: Gastroenterology

## 2016-11-14 DIAGNOSIS — K50013 Crohn's disease of small intestine with fistula: Secondary | ICD-10-CM

## 2016-11-14 DIAGNOSIS — K219 Gastro-esophageal reflux disease without esophagitis: Secondary | ICD-10-CM | POA: Diagnosis not present

## 2016-11-14 DIAGNOSIS — N824 Other female intestinal-genital tract fistulae: Secondary | ICD-10-CM

## 2016-11-14 NOTE — Progress Notes (Signed)
Subjective:    Patient ID: [name] [name], female    DOB: 1970-06-18, 46 y.o.   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 HURTS AND IT DRAINS. GETTING ENTYVIO EVERY 8 WEEKS. BMs: WORKING SAME AS Remicade. Does good pretty good but AFTER 1-2 WEEKS FEELS BAD. SYMPTOMS ARE INTERMITTENT NOW. DR. WATERS COULDN'T FIX FISTULA AND WANTED TO PUT HER ON A BAG. PASSING STOOL THROUGH HER VAGINA. JUST DEALING WITH IT. IT'S NOT GOING AWAY. 6-MP DID NOT WORK. DOESN'T REMEMBER MTX. NOT INTERESTED IN GOING BACK TO [facility] AT THIS TIME. AFTER ENTYVIO: #4 AND THEN GRADUALLY RETURN TO #6. MAY SEE SOME BLOOD: AFTER WIPES:1-2X/WEEK(SML). SHOTS: FLU, HEP AB, SHINGLES(2017), XAJ(2878). CIGS: 1/2 PK/DAY. MAY USE ADVIL FOR HA(3)-LAST USE(1 YRS AGO-1-2X/YEAR). NO ASPIRIN, BC/GOODY POWDERS, OR NAPROXEN/ALEVE.   NO FEVER OR CHILLS, HEMATEMESIS, nausea, vomiting, melena, CHEST PAIN, SHORTNESS OF BREATH, CHANGE IN BOWEL IN HABITS, constipation, abdominal pain, problems swallowing, OR heartburn or indigestion.  Past Medical History:  Diagnosis Date  . Anxiety   . Arthritis    Dr. Eddie Dibbles  . Bartholin cyst 2008   vag.  [name] BIPOLAR AFFECTIVE DISORDER 04/14/2007  . 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  [name] Depression   . Elevated glucose 2010  . GERD (gastroesophageal reflux disease)   . Hypertension   . Kidney stone   . LBP (low back pain)    Dr. Mina Marble  . Perianal abscess 2009  . Vitamin B12 deficiency     Past Surgical History:  Procedure Laterality Date  . COLONOSCOPY  09/2008   Dr. Derrill Kay: ulcers, edema, possible fistula openings all seen in the distal 3 cm of anus/anal canal. 1cm pseudopolyp. rest of colon and TI normal. rectal biopsy with mild chronic active colitis. ileum bx normal.  . COLONOSCOPY WITH PROPOFOL N/A 03/29/2015   SLF: Severe proctocolitis limitied to cecum and rectum. Limited exam of  the colon mucosa and anal canal.   . ELBOW SURGERY     left  . ESOPHAGOGASTRODUODENOSCOPY (EGD) WITH PROPOFOL N/A 03/29/2015   SLF: 1. Erosive gastritis duodentitis.   [name] RECTOVAGINAL FISTULA CLOSURE     did not help   Allergies  Allergen Reactions  . Cephalexin     itching  . Codeine   . Guaifenesin   . Imitrex [Sumatriptan]     2 fingers got very hot  . Morphine   . 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 Sulfonamide Derivatives   . Ultram [Tramadol Hcl]     [employer] syndrome with either remicade or ultram   Current Outpatient Prescriptions  Medication Sig Dispense Refill  . ALPRAZolam (XANAX) 0.5 MG tablet Take 1 tablet (0.5 mg total) by mouth at bedtime as needed. (Patient taking differently: Take 0.5 mg by mouth 4 (four) times daily. ) 30 tablet 2  . benazepril-hydrochlorthiazide (LOTENSIN HCT) 20-12.5 MG tablet Take 1 tablet by mouth daily. 90 tablet 1  . cyanocobalamin (,VITAMIN B-12,) 1000 MCG/ML injection Inject 1 mL (1,000 mcg total) into the skin every 14 (fourteen) days. 10 mL 3  . cyanocobalamin (,VITAMIN B-12,) 1000 MCG/ML injection INJECT 1 ML (1,000 MCG TOTAL) INTO THE SKIN EVERY 14 (FOURTEEN) DAYS. 10 mL 2  . cyclobenzaprine (FLEXERIL) 10 MG tablet Take 1 tablet (10 mg total) by mouth at bedtime. 30 tablet 3  .  HYDROcodone-acetaminophen (NORCO) 10-325 MG tablet Take 1 tablet by mouth every 6 (six) hours as needed for severe pain. Please fill on or after 01/27/17 120 tablet 0  . ketorolac (TORADOL) 10 MG tablet Take 1 tablet (10 mg total) by mouth every 6 (six) hours as needed. for pain 20 tablet 1  . lamoTRIgine (LAMICTAL) 200 MG tablet Take 1 tablet by mouth daily.    . meperidine (DEMEROL) 50 MG tablet Take 1 tablet (50 mg total) by mouth daily as needed. For sever pain 20 tablet 0  . metroNIDAZOLE (METROGEL) 0.75 % vaginal gel Place 1 Applicatorful vaginally at bedtime. Apply one applicatorful to vagina at bedtime  for 5 days  70 g 1  . norethindrone (AYGESTIN) 5 MG tablet Take 1 tablet (5 mg total) by mouth daily. 90 tablet 3  . RaNITidine HCl (ZANTAC 75 PO) Take 1 tablet by mouth daily.    . sertraline (ZOLOFT) 100 MG tablet Take 100 mg by mouth 2 (two) times daily.      . sodium chloride 0.9 % SOLN 250 mL with vedolizumab 300 MG SOLR 300 mg Inject 300 mg into the vein. LOADING DOSE THEN Q8 WEEKS    .   20 tablet 0  .   59 mL 1  .   60 capsule 11   Review of Systems PER HPI OTHERWISE ALL SYSTEMS ARE NEGATIVE.     Objective:   Physical Exam  Constitutional: She is oriented to person, place, and time. She appears well-developed and well-nourished. No distress.  HENT:  Head: Normocephalic and atraumatic.  Mouth/Throat: Oropharynx is clear and moist. No oropharyngeal exudate.  Eyes: Pupils are equal, round, and reactive to light. No scleral icterus.  Neck: Normal range of motion. Neck supple.  Cardiovascular: Normal rate, regular rhythm and normal heart sounds.   Pulmonary/Chest: Effort normal and breath sounds normal. No respiratory distress.  Abdominal: Soft. Bowel sounds are normal. She exhibits no distension. There is no tenderness.  Genitourinary:     Genitourinary Comments: Indurated and edematous. Two fistulous tracts noted. No drainage. GRADE III INTERNAL HEMORRHOID. NON-TENDER PERIANAL REGION  Musculoskeletal: She exhibits no edema.  Lymphadenopathy:    She has no cervical adenopathy.  Neurological: She is alert and oriented to person, place, and time.  NO  NEW FOCAL DEFICITS  Psychiatric:  FLAT AFFECT, NL MOOD  Vitals reviewed.         Assessment & Plan:

## 2016-11-14 NOTE — Assessment & Plan Note (Signed)
SYMPTOMS NOT IDEALLY CONTROLLED BUT TOLERABLE.  CONSIDER SWITCH OR ADDITION OF PO FLAGYL AS WELL.

## 2016-11-14 NOTE — Assessment & Plan Note (Signed)
SYMPTOMS CONTROLLED/RESOLVED.  CONTINUE TO MONITOR SYMPTOMS. 

## 2016-11-14 NOTE — Assessment & Plan Note (Addendum)
SYMPTOMS NOT IDEALLY CONTROLLED BUT PT HESITANT TO SWITCH AT THIS TIME. IMMUNIZATIONS/BONE DENSITY UP TO DATE. REVIEWED LABS FROM SEP 24. PLT CT 144 K.  CONSIDER ALTERNATIVE THERAPY FOR CROHN'S TO INCLUDE STELAAR PLUS METHOTREXATE OR IMURAN. WILL DISCUSS WITH DR. Renee Harder IF PT DECIDES TO CHANGE THERAPY. SINCE PT DOES NOT WANT TO RETURN TO [facility] AT THIS TIME. CONTINUE ENYTVIO. DRINK WATER TO KEEP YOUR URINE LIGHT YELLOW. FOLLOW A HIGH FIBER DIET. AVOID ITEMS THAT CAUSE BLOATING & GAS.  FOLLOW UP IN 6 MOS. MERRY CHRISTMAS AND HAPPY NEW YEAR! PLEASE CALL WITH QUESTIONS OR CONCERNS.  GREATER THAN 50% WAS SPENT IN COUNSELING & COORDINATION OF CARE WITH THE PATIENT: DISCUSSED DIFFERENTIAL DIAGNOSIS, PROCEDURE, BENEFITS, RISKS, AND MANAGEMENT OF CROHN'S DISEASE. TOTAL ENCOUNTER TIME: 40 MINS.

## 2016-11-14 NOTE — Patient Instructions (Signed)
CONSIDER ALTERNATIVE THERAPY FOR CROHN'S TO INCLUDE STELAAR PLUS METHOTREXATE OR IMURAN.  CONTINUE ENYTVIO.  DRINK WATER TO KEEP YOUR URINE LIGHT YELLOW.  FOLLOW A HIGH FIBER DIET. AVOID ITEMS THAT CAUSE BLOATING & GAS.   FOLLOW UP IN 6 MOS. MERRY CHRISTMAS AND HAPPY NEW YEAR!  PLEASE CALL WITH QUESTIONS OR CONCERNS.

## 2016-11-14 NOTE — Progress Notes (Signed)
ON RECALL  °

## 2016-11-15 ENCOUNTER — Ambulatory Visit: Payer: 59 | Admitting: Gastroenterology

## 2016-11-15 NOTE — Progress Notes (Signed)
CC'ED TO PCP 

## 2016-11-15 NOTE — Telephone Encounter (Signed)
I called [employer], spoke with Threasa Beards, she said the claim did not have the authorization number on it and the dx code on the claim and the dx code on the PA does not match. She recommended we send in a corrected claim, send in medical records and a reconsideration form. Ref # [id number].  I called and informed April at [facility], she checked the claim and it does not have the auth number on it and the dx codes are different, but she said this shouldn't be what is denying the claim. She recommended I call back next week and speak with someone else and see what they say about this and we may still have to do the extra forms.

## 2016-12-05 ENCOUNTER — Encounter: Payer: Self-pay | Admitting: Internal Medicine

## 2016-12-06 ENCOUNTER — Encounter: Payer: Self-pay | Admitting: Internal Medicine

## 2016-12-06 MED ORDER — BENAZEPRIL-HYDROCHLOROTHIAZIDE 20-12.5 MG PO TABS: 1.0000 | ORAL_TABLET | Freq: Every day | ORAL | 0 refills | Status: DC

## 2016-12-06 MED ORDER — CYANOCOBALAMIN 1000 MCG/ML IJ SOLN: 1000.0000 ug | INTRAMUSCULAR | 0 refills | Status: DC

## 2016-12-10 MED ORDER — CYANOCOBALAMIN 1000 MCG/ML IJ SOLN: 1000.0000 ug | INTRAMUSCULAR | 0 refills | Status: DC

## 2016-12-12 MED ORDER — CYANOCOBALAMIN 1000 MCG/ML IJ SOLN: 1000.0000 ug | INTRAMUSCULAR | 0 refills | Status: DC

## 2016-12-12 NOTE — Addendum Note (Signed)
Addended by: Karren Cobble on: 12/12/2016 08:44 AM   Modules accepted: Orders

## 2016-12-24 ENCOUNTER — Encounter: Payer: Self-pay | Admitting: Obstetrics and Gynecology

## 2016-12-24 ENCOUNTER — Telehealth: Payer: Self-pay | Admitting: *Deleted

## 2016-12-24 ENCOUNTER — Other Ambulatory Visit: Payer: Self-pay | Admitting: *Deleted

## 2016-12-24 ENCOUNTER — Ambulatory Visit (INDEPENDENT_AMBULATORY_CARE_PROVIDER_SITE_OTHER): Payer: 59 | Admitting: Obstetrics and Gynecology

## 2016-12-24 ENCOUNTER — Other Ambulatory Visit (HOSPITAL_COMMUNITY)
Admission: RE | Admit: 2016-12-24 | Discharge: 2016-12-24 | Disposition: A | Discharge status: Home / Self Care | Payer: 59 | Source: Ambulatory Visit | Attending: Obstetrics and Gynecology | Admitting: Obstetrics and Gynecology

## 2016-12-24 VITALS — BP 150/88 | HR 96 | Ht 62.0 in | Wt 155.0 lb

## 2016-12-24 DIAGNOSIS — N6310 Unspecified lump in the right breast, unspecified quadrant: Secondary | ICD-10-CM | POA: Diagnosis not present

## 2016-12-24 DIAGNOSIS — Z01419 Encounter for gynecological examination (general) (routine) without abnormal findings: Secondary | ICD-10-CM | POA: Insufficient documentation

## 2016-12-24 DIAGNOSIS — Z01411 Encounter for gynecological examination (general) (routine) with abnormal findings: Secondary | ICD-10-CM

## 2016-12-24 DIAGNOSIS — N6311 Unspecified lump in the right breast, upper outer quadrant: Secondary | ICD-10-CM

## 2016-12-24 DIAGNOSIS — N63 Unspecified lump in unspecified breast: Secondary | ICD-10-CM

## 2016-12-24 DIAGNOSIS — K5010 Crohn's disease of large intestine without complications: Secondary | ICD-10-CM

## 2016-12-24 NOTE — Telephone Encounter (Signed)
[name] [name] had called and left a voicemail. She asked me to do the PA again and see if we can change the place where it is done. I have sent it in.

## 2016-12-24 NOTE — Progress Notes (Addendum)
Patient ID: [name] [name], female   DOB: 11-Jun-1970, 47 y.o.   MRN: [id number]  Assessment:  1. Annual Gyn Exam 2. 1 cm non-tender nodule to right breast at 10 o'clock 3. History of leep greater than 10 years ago, will do Pap q year for 20 years 5. Crohn's disease, stable Plan:  1. pap smear done  2. return annually or prn 3    Diagnostic Mammogram 4.  Rx refilled for cipro, diflucan, Micronor Subjective:  [name] [name] is a 46 y.o. female G0P0000 who presents for annual exam. No LMP recorded. Patient is not currently having periods (Reason: Oral contraceptives). The patient reports that she has never had a mammogram. She notes she will schedule one. She has crohn's disease and has a fistula in place. In perianal tissues 2  The following portions of the patient's history were reviewed and updated as appropriate: allergies, current medications, past family history, past medical history, past social history, past surgical history and problem list. Past Medical History:  Diagnosis Date  . Anxiety   . Arthritis    Dr. Eddie Dibbles  . Bartholin cyst 2008   vag.  [name] BIPOLAR AFFECTIVE DISORDER 04/14/2007  . Crohn's ON ENTYVIO SINCE FEB [id number]   COMPLICATED BY RECTOVAGINAL FISTULA. SJS WITH REMICADE. FAILED HUMIRA.  [name] Depression   . Elevated glucose 2010  . GERD (gastroesophageal reflux disease)   . Hypertension   . Kidney stone   . LBP (low back pain)    Dr. Mina Marble  . Perianal abscess 2009  . Vitamin B12 deficiency     Past Surgical History:  Procedure Laterality Date  . COLONOSCOPY  09/2008   Dr. Derrill Kay: ulcers, edema, possible fistula openings all seen in the distal 3 cm of anus/anal canal. 1cm pseudopolyp. rest of colon and TI normal. rectal biopsy with mild chronic active colitis. ileum bx normal.  . ELBOW SURGERY     left  . RECTOVAGINAL FISTULA CLOSURE     did not help     Current Outpatient Medications:  .  ALPRAZolam (XANAX) 0.5 MG tablet, Take 1 tablet (0.5 mg total) by  mouth at bedtime as needed. (Patient taking differently: Take 0.5 mg by mouth 4 (four) times daily. ), Disp: 30 tablet, Rfl: 2 .  benazepril-hydrochlorthiazide (LOTENSIN HCT) 20-12.5 MG tablet, Take 1 tablet by mouth daily. Keep schedule appt in Dec for future refills, Disp: 90 tablet, Rfl: 0 .  cyanocobalamin (,VITAMIN B-12,) 1000 MCG/ML injection, Inject 1 mL (1,000 mcg total) into the skin every 14 (fourteen) days., Disp: 30 mL, Rfl: 0 .  cyclobenzaprine (FLEXERIL) 10 MG tablet, Take 1 tablet (10 mg total) by mouth at bedtime., Disp: 30 tablet, Rfl: 3 .  HYDROcodone-acetaminophen (NORCO) 10-325 MG tablet, Take 1 tablet by mouth every 6 (six) hours as needed for severe pain. Please fill on or after 01/27/17, Disp: 120 tablet, Rfl: 0 .  ketorolac (TORADOL) 10 MG tablet, Take 1 tablet (10 mg total) by mouth every 6 (six) hours as needed. for pain, Disp: 20 tablet, Rfl: 1 .  lamoTRIgine (LAMICTAL) 200 MG tablet, Take 1 tablet by mouth daily., Disp: , Rfl:  .  meperidine (DEMEROL) 50 MG tablet, Take 1 tablet (50 mg total) by mouth daily as needed. For sever pain, Disp: 20 tablet, Rfl: 0 .  metroNIDAZOLE (METROGEL) 0.75 % vaginal gel, Place 1 Applicatorful vaginally at bedtime. Apply one applicatorful to vagina at bedtime for 5 days (Patient taking differently: Place 1 Applicatorful vaginally as needed.  Apply one applicatorful to vagina at bedtime for 5 days), Disp: 70 g, Rfl: 1 .  norethindrone (AYGESTIN) 5 MG tablet, Take 1 tablet (5 mg total) by mouth daily., Disp: 90 tablet, Rfl: 3 .  RaNITidine HCl (ZANTAC 75 PO), Take 1 tablet by mouth daily., Disp: , Rfl:  .  sertraline (ZOLOFT) 100 MG tablet, Take 100 mg by mouth 2 (two) times daily.  , Disp: , Rfl:  .  sodium chloride 0.9 % SOLN 250 mL with vedolizumab 300 MG SOLR 300 mg, Inject 300 mg into the vein. LOADING DOSE THEN Q8 WEEKS, Disp: , Rfl:  .  vedolizumab (ENTYVIO) 300 MG injection, Inject into the vein. Every 8 weeks, Disp: , Rfl:   Review of  Systems Constitutional: negative Gastrointestinal: crohn's Disease, fistula Genitourinary: negative  Objective:  BP (!) 150/88 (BP Location: Right Arm, Patient Position: Sitting, Cuff Size: Large)   Pulse 96   Ht 5' 2"  (1.575 m)   Wt 155 lb (70.3 kg)   BMI 28.35 kg/m    BMI: Body mass index is 28.35 kg/m.  General Appearance: Alert, appropriate appearance for age. No acute distress HEENT: Grossly normal Neck / Thyroid:  Cardiovascular: RRR; normal S1, S2, no murmur Lungs: CTA bilaterally Back: No CVAT Breast Exam: 1 cm, firm, mobile, non-tender nodule at 10 o'clock in the right upper quadrant of the right breast Gastrointestinal: Soft, non-tender, no masses or organomegaly Pelvic Exam:  External genitalia: 2 perineal fistula, thrombosed external hemorrhoid. Cervix: short with status post leep changes Uterus: well supported, normal size Rectovaginal: not indicated and declined by the patient Lymphatic Exam: Non-palpable nodes in neck, clavicular, axillary, or inguinal regions Skin: no rash or abnormalities Neurologic: Normal gait and speech, no tremor  Psychiatric: Alert and oriented, appropriate affect.  Urinalysis:Not done  Mallory Shirk. MD Pgr [phone] 9:12 AM   By signing my name below, I, Margit Banda, attest that this documentation has been prepared under the direction and in the presence of Jonnie Kind, MD. Electronically Signed: Margit Banda, Medical Scribe. 12/24/16. 9:12 AM.  I personally performed the services described in this documentation, which was SCRIBED in my presence. The recorded information has been reviewed and considered accurate. It has been edited as necessary during review. Jonnie Kind, MD

## 2016-12-24 NOTE — Telephone Encounter (Signed)
Informed patient that diagnostic mammogram was scheduled for 11/13 @ 10:40. Pt states she needs an earlier time. Advised patient to call hospital to reschedule. Number given.

## 2016-12-25 ENCOUNTER — Other Ambulatory Visit: Payer: Self-pay | Admitting: *Deleted

## 2016-12-25 ENCOUNTER — Encounter: Payer: Self-pay | Admitting: Obstetrics and Gynecology

## 2016-12-25 LAB — CYTOLOGY - PAP
Chlamydia: NEGATIVE
Diagnosis: NEGATIVE
HPV: NOT DETECTED
Neisseria Gonorrhea: NEGATIVE

## 2016-12-25 MED ORDER — CIPROFLOXACIN HCL 500 MG PO TABS
500.0000 mg | ORAL_TABLET | Freq: Two times a day (BID) | ORAL | 0 refills | Status: DC
Start: 2016-12-25 — End: 2017-01-25

## 2016-12-25 MED ORDER — NORETHINDRONE ACETATE 5 MG PO TABS: 5.0000 mg | ORAL_TABLET | Freq: Every day | ORAL | 3 refills | Status: DC

## 2016-12-25 MED ORDER — FLUCONAZOLE 150 MG PO TABS: 150.0000 mg | ORAL_TABLET | Freq: Once | ORAL | 1 refills | Status: DC

## 2016-12-25 NOTE — Telephone Encounter (Signed)
Spoke with [name] [name] at [facility] and the infusion location has been changed to [facility] and it has been approved for 1 year. They have not faxed an approval yet.

## 2016-12-31 ENCOUNTER — Encounter (HOSPITAL_COMMUNITY)
Admission: RE | Admit: 2016-12-31 | Discharge: 2016-12-31 | Disposition: A | Discharge status: Home / Self Care | Payer: 59 | Source: Ambulatory Visit | Attending: Gastroenterology | Admitting: Gastroenterology

## 2016-12-31 DIAGNOSIS — N823 Fistula of vagina to large intestine: Secondary | ICD-10-CM | POA: Insufficient documentation

## 2016-12-31 DIAGNOSIS — K5000 Crohn's disease of small intestine without complications: Secondary | ICD-10-CM | POA: Diagnosis not present

## 2016-12-31 MED ORDER — SODIUM CHLORIDE 0.9 % IV SOLN
Freq: Once | INTRAVENOUS | Status: AC
  Administered 2016-12-31: 250 mL via INTRAVENOUS

## 2016-12-31 MED ORDER — METHYLPREDNISOLONE SODIUM SUCC 40 MG IJ SOLR
40.0000 mg | Freq: Once | INTRAMUSCULAR | Status: AC
  Administered 2016-12-31: 40 mg via INTRAVENOUS
  Filled 2016-12-31: qty 1

## 2016-12-31 MED ORDER — VEDOLIZUMAB 300 MG IV SOLR
300.0000 mg | Freq: Once | INTRAVENOUS | Status: AC
  Administered 2016-12-31: 300 mg via INTRAVENOUS
  Filled 2016-12-31: qty 5

## 2016-12-31 MED ORDER — LORATADINE 10 MG PO TABS: 10.0000 mg | ORAL_TABLET | Freq: Once | ORAL | Status: DC

## 2016-12-31 MED ORDER — ACETAMINOPHEN 325 MG PO TABS: 650.0000 mg | ORAL_TABLET | Freq: Once | ORAL | Status: DC

## 2016-12-31 NOTE — Telephone Encounter (Signed)
All orders have been done and faxed to [facility] and I spoke with Maudie Mercury.

## 2016-12-31 NOTE — Telephone Encounter (Signed)
Spoke with Hope at [facility] and she stated the approval number is [id number] and it is good for 1 year.

## 2016-12-31 NOTE — Progress Notes (Signed)
Pt states she took her zyrtec and tylenol at home before coming to hospital.

## 2017-01-01 ENCOUNTER — Ambulatory Visit (HOSPITAL_COMMUNITY)
Admission: RE | Admit: 2017-01-01 | Discharge: 2017-01-01 | Disposition: A | Discharge status: Home / Self Care | Payer: 59 | Source: Ambulatory Visit | Attending: Obstetrics and Gynecology | Admitting: Obstetrics and Gynecology

## 2017-01-01 DIAGNOSIS — N6311 Unspecified lump in the right breast, upper outer quadrant: Secondary | ICD-10-CM | POA: Insufficient documentation

## 2017-01-01 DIAGNOSIS — N6002 Solitary cyst of left breast: Secondary | ICD-10-CM | POA: Insufficient documentation

## 2017-01-07 ENCOUNTER — Telehealth: Payer: Self-pay | Admitting: Internal Medicine

## 2017-01-07 NOTE — Telephone Encounter (Signed)
01/06/2017 @ 5:42pm [facility] Call: Loma Sousa from [facility] called stating that DUR rejection requires an override from the MD for Hydrocodone 10/325 (4 per day). On call MD was paged Alysia Penna) who stated that the override would need to come from the PCP.

## 2017-01-08 NOTE — Telephone Encounter (Signed)
Called [facility] and they did not know what this is about

## 2017-01-21 ENCOUNTER — Ambulatory Visit: Payer: 59 | Admitting: Internal Medicine

## 2017-01-21 ENCOUNTER — Telehealth: Payer: Self-pay | Admitting: Internal Medicine

## 2017-01-21 MED ORDER — BENAZEPRIL-HYDROCHLOROTHIAZIDE 20-12.5 MG PO TABS: 1.0000 | ORAL_TABLET | Freq: Every day | ORAL | 0 refills | Status: DC

## 2017-01-21 MED ORDER — PROMETHAZINE HCL 25 MG PO TABS: 25.0000 mg | ORAL_TABLET | Freq: Two times a day (BID) | ORAL | 0 refills | Status: DC | PRN

## 2017-01-21 MED ORDER — KETOROLAC TROMETHAMINE 10 MG PO TABS: 10.0000 mg | ORAL_TABLET | Freq: Four times a day (QID) | ORAL | 0 refills | Status: DC | PRN

## 2017-01-21 NOTE — Telephone Encounter (Signed)
Okay per Dr Alain Marion, RX sent

## 2017-01-21 NOTE — Telephone Encounter (Signed)
Caller name: Relation to pt: Call back number: Pharmacy: [address] [place] [address]   Reason for call:  Patient following up on message mentioned below, patient requesting benazepril-hydrochlorthiazide (LOTENSIN HCT) 20-12.5 MG tablet, ketorolac (TORADOL) 10 MG tablet, promethazine (PHENERGAN) 25 MG tablet, please advise
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
## 2017-01-24 ENCOUNTER — Telehealth: Payer: Self-pay | Admitting: Internal Medicine

## 2017-01-24 ENCOUNTER — Telehealth: Payer: Self-pay | Admitting: Obstetrics and Gynecology

## 2017-01-24 NOTE — Telephone Encounter (Signed)
Hydrocodone refill.Prescription written on 11/12/16 states medication can be refilled on or after 01/27/17.

## 2017-01-24 NOTE — Telephone Encounter (Signed)
Copied from [place] [phone]. Topic: Quick Communication - Rx Refill/Question >> Jan 24, 2017 12:06 PM Marin Olp L wrote: Has the patient contacted their pharmacy? No. (Agent: If no, request that the patient contact the pharmacy for the refill.) Preferred Pharmacy (with phone number or street name): Will pick it up in person.  Agent: Please be advised that RX refills may take up to 3 business days. We ask that you follow-up with your pharmacy. Refill HYDROcodone-acetaminophen (NORCO) 10-325 MG tablet (paper script) Patient is requesting to please call to let her know if it can be filled? She will be back in town on Monday 12/10.

## 2017-01-24 NOTE — Telephone Encounter (Signed)
Check [place] registry last filled 01/07/2017...[name] [name]

## 2017-01-25 ENCOUNTER — Telehealth: Payer: Self-pay | Admitting: Internal Medicine

## 2017-01-25 MED ORDER — HYDROCODONE-ACETAMINOPHEN 10-325 MG PO TABS: 1.0000 | ORAL_TABLET | Freq: Four times a day (QID) | ORAL | 0 refills | Status: DC | PRN

## 2017-01-25 MED ORDER — CIPROFLOXACIN HCL 500 MG PO TABS
500.0000 mg | ORAL_TABLET | Freq: Two times a day (BID) | ORAL | 0 refills | Status: DC
Start: 2017-01-25 — End: 2017-05-22

## 2017-01-25 MED ORDER — FLUCONAZOLE 150 MG PO TABS: 150.0000 mg | ORAL_TABLET | Freq: Once | ORAL | 1 refills | Status: AC

## 2017-01-25 NOTE — Telephone Encounter (Signed)
Informed patient that prescriptions were sent to pharmacy.

## 2017-01-25 NOTE — Telephone Encounter (Signed)
Called pt no answer LMOM MD has approved refill on Hydrocodone. [facility].../lmb

## 2017-01-25 NOTE — Telephone Encounter (Signed)
Notes.../lmb

## 2017-01-25 NOTE — Telephone Encounter (Signed)
Copied from [place] [phone]. Topic: Quick Communication - Rx Refill/Question >> Jan 24, 2017 12:06 PM Marin Olp L wrote: Has the patient contacted their pharmacy? No. (Agent: If no, request that the patient contact the pharmacy for the refill.) Preferred Pharmacy (with phone number or street name): Will pick it up in person.  Agent: Please be advised that RX refills may take up to 3 business days. We ask that you follow-up with your pharmacy. Refill HYDROcodone-acetaminophen (NORCO) 10-325 MG tablet (paper script) >> Jan 25, 2017  9:28 AM Robina Ade, Helene Kelp D wrote: Patient called [facility] and knows to get her medication from her pharmacy. She is thankful for the refill.

## 2017-01-25 NOTE — Telephone Encounter (Signed)
Ok done [employer]

## 2017-02-04 ENCOUNTER — Ambulatory Visit: Payer: 59 | Admitting: Internal Medicine

## 2017-02-07 ENCOUNTER — Ambulatory Visit: Payer: 59 | Admitting: Internal Medicine

## 2017-02-11 ENCOUNTER — Ambulatory Visit: Payer: Self-pay | Admitting: Internal Medicine

## 2017-02-15 ENCOUNTER — Other Ambulatory Visit: Payer: Self-pay | Admitting: Internal Medicine

## 2017-02-25 ENCOUNTER — Encounter (HOSPITAL_COMMUNITY)
Admission: RE | Admit: 2017-02-25 | Discharge: 2017-02-25 | Disposition: A | Discharge status: Home / Self Care | Payer: 59 | Source: Ambulatory Visit | Attending: Gastroenterology | Admitting: Gastroenterology

## 2017-02-25 ENCOUNTER — Encounter (HOSPITAL_COMMUNITY): Payer: Self-pay

## 2017-02-25 DIAGNOSIS — K50918 Crohn's disease, unspecified, with other complication: Secondary | ICD-10-CM | POA: Diagnosis not present

## 2017-02-25 DIAGNOSIS — K605 Anorectal fistula: Secondary | ICD-10-CM | POA: Diagnosis not present

## 2017-02-25 MED ORDER — VEDOLIZUMAB 300 MG IV SOLR
300.0000 mg | INTRAVENOUS | Status: DC
  Administered 2017-02-25: 300 mg via INTRAVENOUS
  Filled 2017-02-25: qty 5

## 2017-02-25 MED ORDER — METHYLPREDNISOLONE SODIUM SUCC 40 MG IJ SOLR
40.0000 mg | Freq: Once | INTRAMUSCULAR | Status: AC
  Administered 2017-02-25: 40 mg via INTRAVENOUS
  Filled 2017-02-25: qty 1

## 2017-02-25 MED ORDER — SODIUM CHLORIDE 0.9 % IV SOLN
INTRAVENOUS | Status: DC
  Administered 2017-02-25: 08:00:00 via INTRAVENOUS

## 2017-03-03 ENCOUNTER — Other Ambulatory Visit: Payer: Self-pay | Admitting: Internal Medicine

## 2017-03-07 ENCOUNTER — Ambulatory Visit (INDEPENDENT_AMBULATORY_CARE_PROVIDER_SITE_OTHER): Payer: 59 | Admitting: Internal Medicine

## 2017-03-07 ENCOUNTER — Encounter: Payer: Self-pay | Admitting: Internal Medicine

## 2017-03-07 ENCOUNTER — Ambulatory Visit: Payer: 59 | Admitting: Internal Medicine

## 2017-03-07 DIAGNOSIS — F419 Anxiety disorder, unspecified: Secondary | ICD-10-CM

## 2017-03-07 DIAGNOSIS — F329 Major depressive disorder, single episode, unspecified: Secondary | ICD-10-CM

## 2017-03-07 DIAGNOSIS — N760 Acute vaginitis: Secondary | ICD-10-CM

## 2017-03-07 DIAGNOSIS — K612 Anorectal abscess: Secondary | ICD-10-CM

## 2017-03-07 DIAGNOSIS — M5440 Lumbago with sciatica, unspecified side: Secondary | ICD-10-CM

## 2017-03-07 DIAGNOSIS — F32A Depression, unspecified: Secondary | ICD-10-CM

## 2017-03-07 DIAGNOSIS — G8929 Other chronic pain: Secondary | ICD-10-CM

## 2017-03-07 MED ORDER — CYANOCOBALAMIN 1000 MCG/ML IJ SOLN: 1000.0000 ug | INTRAMUSCULAR | 2 refills | Status: DC

## 2017-03-07 MED ORDER — HYDROCODONE-ACETAMINOPHEN 10-325 MG PO TABS: 1.0000 | ORAL_TABLET | Freq: Four times a day (QID) | ORAL | 0 refills | Status: DC | PRN

## 2017-03-07 MED ORDER — MEPERIDINE HCL 50 MG PO TABS: 50.0000 mg | ORAL_TABLET | Freq: Every day | ORAL | 0 refills | Status: DC | PRN
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
## 2017-03-07 NOTE — Assessment & Plan Note (Addendum)
Fistula - recto-vesicular Cipro per GYN

## 2017-03-07 NOTE — Assessment & Plan Note (Signed)
Xanax prn  Potential benefits of a long term benzodiazepines  use as well as potential risks  and complications were explained to the patient and were aknowledged. Do not take w/pain meds. 

## 2017-03-07 NOTE — Assessment & Plan Note (Signed)
Zoloft

## 2017-03-25 DIAGNOSIS — F3181 Bipolar II disorder: Secondary | ICD-10-CM | POA: Diagnosis not present

## 2017-03-28 ENCOUNTER — Other Ambulatory Visit: Payer: Self-pay | Admitting: Internal Medicine

## 2017-04-18 ENCOUNTER — Encounter: Payer: Self-pay | Admitting: Gastroenterology

## 2017-04-18 ENCOUNTER — Ambulatory Visit (INDEPENDENT_AMBULATORY_CARE_PROVIDER_SITE_OTHER): Payer: 59 | Admitting: Gastroenterology

## 2017-04-18 VITALS — BP 114/74 | HR 100 | Temp 97.6°F | Ht 62.0 in | Wt 152.6 lb

## 2017-04-18 DIAGNOSIS — N824 Other female intestinal-genital tract fistulae: Secondary | ICD-10-CM

## 2017-04-18 DIAGNOSIS — K219 Gastro-esophageal reflux disease without esophagitis: Secondary | ICD-10-CM | POA: Diagnosis not present

## 2017-04-18 DIAGNOSIS — K50013 Crohn's disease of small intestine with fistula: Secondary | ICD-10-CM | POA: Diagnosis not present

## 2017-04-18 NOTE — Patient Instructions (Signed)
DRINK WATER TO KEEP YOUR URINE LIGHT YELLOW.  EAT FIBER.  OK TO TAKE SUPPLEMENTS. AVOID CAFFEINE IN SUPPLEMENTS.  Do no drink DIET SODA, HIGH FRUCTOSE SYRUP, SUGAR FREE GUM, OR USE ARTIFICIAL SWEETENERS. STEVIA OR AGAVE NECTAR ARE OK.  CONTINUE ENTYVIO.  COMPLETE LABS WITHIN 7 DAYS.  FOLLOW UP IN 6 MOS.

## 2017-04-18 NOTE — Assessment & Plan Note (Signed)
SYMPTOMS FAIRLY WELL CONTROLLED ON ENTYVIO.  DRINK WATER TO KEEP YOUR URINE LIGHT YELLOW. EAT FIBER OK TO TAKE SUPPLEMENTS. AVOID CAFFEINE. CONTINUE ENTYVIO. CBC/HFP WITHIN 7 DAYS. FOLLOW UP IN 6 MOS.

## 2017-04-18 NOTE — Assessment & Plan Note (Signed)
SYMPTOMS FAIRLY WELL CONTROLLED.  WANTS TO TAKE RANITIDINE AND AVOID PPI. DISCUSSED RISK OF ESOPHAGEAL STRICTURE. FOLLOW UP IN 6 MOS.
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
## 2017-04-18 NOTE — Assessment & Plan Note (Signed)
NOT HEALED BUT GETTING BETTER.  CONTINUE TO MONITOR SYMPTOMS.

## 2017-04-22 ENCOUNTER — Telehealth: Payer: Self-pay | Admitting: Gastroenterology

## 2017-04-22 ENCOUNTER — Encounter (HOSPITAL_COMMUNITY)
Admission: RE | Admit: 2017-04-22 | Discharge: 2017-04-22 | Disposition: A | Discharge status: Home / Self Care | Payer: 59 | Source: Ambulatory Visit | Attending: Gastroenterology | Admitting: Gastroenterology

## 2017-04-22 DIAGNOSIS — K5090 Crohn's disease, unspecified, without complications: Secondary | ICD-10-CM | POA: Diagnosis not present

## 2017-04-22 LAB — HEPATIC FUNCTION PANEL
ALT: 11 U/L — ABNORMAL LOW (ref 14–54)
AST: 19 U/L (ref 15–41)
Albumin: 4.5 g/dL (ref 3.5–5.0)
Alkaline Phosphatase: 57 U/L (ref 38–126)
Bilirubin, Direct: 0.1 mg/dL (ref 0.1–0.5)
Indirect Bilirubin: 0.6 mg/dL (ref 0.3–0.9)
Total Bilirubin: 0.7 mg/dL (ref 0.3–1.2)
Total Protein: 7.4 g/dL (ref 6.5–8.1)

## 2017-04-22 LAB — COMPREHENSIVE METABOLIC PANEL
ALT: 11 U/L — ABNORMAL LOW (ref 14–54)
AST: 18 U/L (ref 15–41)
Albumin: 4.6 g/dL (ref 3.5–5.0)
Alkaline Phosphatase: 55 U/L (ref 38–126)
Anion gap: 13 (ref 5–15)
BUN: 17 mg/dL (ref 6–20)
CO2: 23 mmol/L (ref 22–32)
Calcium: 9.4 mg/dL (ref 8.9–10.3)
Chloride: 102 mmol/L (ref 101–111)
Creatinine, Ser: 1.37 mg/dL — ABNORMAL HIGH (ref 0.44–1.00)
GFR calc Af Amer: 52 mL/min — ABNORMAL LOW (ref >60)
GFR calc non Af Amer: 45 mL/min — ABNORMAL LOW (ref >60)
Glucose, Bld: 122 mg/dL — ABNORMAL HIGH (ref 65–99)
Potassium: 2.9 mmol/L — ABNORMAL LOW (ref 3.5–5.1)
Sodium: 138 mmol/L (ref 135–145)
Total Bilirubin: 0.6 mg/dL (ref 0.3–1.2)
Total Protein: 7.4 g/dL (ref 6.5–8.1)

## 2017-04-22 LAB — CBC WITH DIFFERENTIAL/PLATELET
Basophils Absolute: 0 10*3/uL (ref 0.0–0.1)
Basophils Relative: 0 %
Eosinophils Absolute: 0 10*3/uL (ref 0.0–0.7)
Eosinophils Relative: 0 %
HCT: 45.3 % (ref 36.0–46.0)
Hemoglobin: 15.5 g/dL — ABNORMAL HIGH (ref 12.0–15.0)
Lymphocytes Relative: 21 %
Lymphs Abs: 1.8 10*3/uL (ref 0.7–4.0)
MCH: 31.4 pg (ref 26.0–34.0)
MCHC: 34.2 g/dL (ref 30.0–36.0)
MCV: 91.7 fL (ref 78.0–100.0)
Monocytes Absolute: 0.3 10*3/uL (ref 0.1–1.0)
Monocytes Relative: 4 %
Neutro Abs: 6.4 10*3/uL (ref 1.7–7.7)
Neutrophils Relative %: 75 %
Platelets: 120 10*3/uL — ABNORMAL LOW (ref 150–400)
RBC: 4.94 MIL/uL (ref 3.87–5.11)
RDW: 13.7 % (ref 11.5–15.5)
WBC: 8.5 10*3/uL (ref 4.0–10.5)

## 2017-04-22 LAB — VITAMIN B12: Vitamin B-12: 214 pg/mL (ref 180–914)

## 2017-04-22 MED ORDER — METHYLPREDNISOLONE SODIUM SUCC 40 MG IJ SOLR
40.0000 mg | Freq: Once | INTRAMUSCULAR | Status: AC
  Administered 2017-04-22: 40 mg via INTRAVENOUS
  Filled 2017-04-22: qty 1

## 2017-04-22 MED ORDER — VEDOLIZUMAB 300 MG IV SOLR
300.0000 mg | Freq: Once | INTRAVENOUS | Status: AC
  Administered 2017-04-22: 300 mg via INTRAVENOUS
  Filled 2017-04-22: qty 5

## 2017-04-22 MED ORDER — POTASSIUM CHLORIDE ER 20 MEQ PO TBCR: EXTENDED_RELEASE_TABLET | ORAL | 0 refills | Status: DC

## 2017-04-22 MED ORDER — SODIUM CHLORIDE 0.9 % IV SOLN
INTRAVENOUS | Status: DC
  Administered 2017-04-22: 250 mL via INTRAVENOUS

## 2017-04-22 NOTE — Telephone Encounter (Signed)
LOW K DUE TO HCTZ. REPLACE K.

## 2017-04-22 NOTE — Progress Notes (Signed)
Pt is aware to pick up the potassium and aware to talk with PCP about her elevated creatinine.

## 2017-04-22 NOTE — Progress Notes (Signed)
Results for DALINDA, HEIDT (MRN [id number]) as of 04/22/2017 15:50  Ref. Range 04/22/2017 08:39 04/22/2017 09:08  COMPREHENSIVE METABOLIC PANEL Unknown Rpt (A)   Sodium Latest Ref Range: 135 - 145 mmol/L 138   Potassium Latest Ref Range: 3.5 - 5.1 mmol/L 2.9 (L)   Chloride Latest Ref Range: 101 - 111 mmol/L 102   CO2 Latest Ref Range: 22 - 32 mmol/L 23   Glucose Latest Ref Range: 65 - 99 mg/dL 122 (H)   BUN Latest Ref Range: 6 - 20 mg/dL 17   Creatinine Latest Ref Range: 0.44 - 1.00 mg/dL 1.37 (H)   Calcium Latest Ref Range: 8.9 - 10.3 mg/dL 9.4   Anion gap Latest Ref Range: 5 - 15  13   Alkaline Phosphatase Latest Ref Range: 38 - 126 U/L 55 57  Albumin Latest Ref Range: 3.5 - 5.0 g/dL 4.6 4.5  AST Latest Ref Range: 15 - 41 U/L 18 19  ALT Latest Ref Range: 14 - 54 U/L 11 (L) 11 (L)  Total Protein Latest Ref Range: 6.5 - 8.1 g/dL 7.4 7.4  Bilirubin, Direct Latest Ref Range: 0.1 - 0.5 mg/dL  0.1  Indirect Bilirubin Latest Ref Range: 0.3 - 0.9 mg/dL  0.6  Total Bilirubin Latest Ref Range: 0.3 - 1.2 mg/dL 0.6 0.7  GFR, Est Non African American Latest Ref Range: >60 mL/min 45 (L)   GFR, Est African American Latest Ref Range: >60 mL/min 52 (L)   Vitamin B12 Latest Ref Range: 180 - 914 pg/mL  214  WBC Latest Ref Range: 4.0 - 10.5 K/uL 8.5   RBC Latest Ref Range: 3.87 - 5.11 MIL/uL 4.94   Hemoglobin Latest Ref Range: 12.0 - 15.0 g/dL 15.5 (H)   HCT Latest Ref Range: 36.0 - 46.0 % 45.3   MCV Latest Ref Range: 78.0 - 100.0 fL 91.7   MCH Latest Ref Range: 26.0 - 34.0 pg 31.4   MCHC Latest Ref Range: 30.0 - 36.0 g/dL 34.2   RDW Latest Ref Range: 11.5 - 15.5 % 13.7   Platelets Latest Ref Range: 150 - 400 K/uL 120 (L)   Neutrophils Latest Units: % 75   Lymphocytes Latest Units: % 21   Monocytes Relative Latest Units: % 4   Eosinophil Latest Units: % 0   Basophil Latest Units: % 0   NEUT# Latest Ref Range: 1.7 - 7.7 K/uL 6.4   Lymphocyte # Latest Ref Range: 0.7 - 4.0 K/uL 1.8   Monocyte # Latest  Ref Range: 0.1 - 1.0 K/uL 0.3   Eosinophils Absolute Latest Ref Range: 0.0 - 0.7 K/uL 0.0   Basophils Absolute Latest Ref Range: 0.0 - 0.1 K/uL 0.0

## 2017-04-23 LAB — VITAMIN D 25 HYDROXY (VIT D DEFICIENCY, FRACTURES): Vit D, 25-Hydroxy: 34.9 ng/mL (ref 30.0–100.0)

## 2017-04-24 NOTE — Progress Notes (Signed)
Results for LACHINA, SALSBERRY (MRN [id number]) as of 04/24/2017 07:27  Ref. Range 04/22/2017 08:39 04/22/2017 09:08  Sodium Latest Ref Range: 135 - 145 mmol/L 138   Potassium Latest Ref Range: 3.5 - 5.1 mmol/L 2.9 (L)   Chloride Latest Ref Range: 101 - 111 mmol/L 102   CO2 Latest Ref Range: 22 - 32 mmol/L 23   Glucose Latest Ref Range: 65 - 99 mg/dL 122 (H)   BUN Latest Ref Range: 6 - 20 mg/dL 17   Creatinine Latest Ref Range: 0.44 - 1.00 mg/dL 1.37 (H)   Calcium Latest Ref Range: 8.9 - 10.3 mg/dL 9.4   Anion gap Latest Ref Range: 5 - 15  13   Alkaline Phosphatase Latest Ref Range: 38 - 126 U/L 55 57  Albumin Latest Ref Range: 3.5 - 5.0 g/dL 4.6 4.5  AST Latest Ref Range: 15 - 41 U/L 18 19  ALT Latest Ref Range: 14 - 54 U/L 11 (L) 11 (L)  Total Protein Latest Ref Range: 6.5 - 8.1 g/dL 7.4 7.4  Bilirubin, Direct Latest Ref Range: 0.1 - 0.5 mg/dL  0.1  Indirect Bilirubin Latest Ref Range: 0.3 - 0.9 mg/dL  0.6  Total Bilirubin Latest Ref Range: 0.3 - 1.2 mg/dL 0.6 0.7  GFR, Est Non African American Latest Ref Range: >60 mL/min 45 (L)   GFR, Est African American Latest Ref Range: >60 mL/min 52 (L)   Vitamin D, 25-Hydroxy Latest Ref Range: 30.0 - 100.0 ng/mL  34.9  Vitamin B12 Latest Ref Range: 180 - 914 pg/mL  214  WBC Latest Ref Range: 4.0 - 10.5 K/uL 8.5   RBC Latest Ref Range: 3.87 - 5.11 MIL/uL 4.94   Hemoglobin Latest Ref Range: 12.0 - 15.0 g/dL 15.5 (H)   HCT Latest Ref Range: 36.0 - 46.0 % 45.3   MCV Latest Ref Range: 78.0 - 100.0 fL 91.7   MCH Latest Ref Range: 26.0 - 34.0 pg 31.4   MCHC Latest Ref Range: 30.0 - 36.0 g/dL 34.2   RDW Latest Ref Range: 11.5 - 15.5 % 13.7   Platelets Latest Ref Range: 150 - 400 K/uL 120 (L)   Neutrophils Latest Units: % 75   Lymphocytes Latest Units: % 21   Monocytes Relative Latest Units: % 4   Eosinophil Latest Units: % 0   Basophil Latest Units: % 0   NEUT# Latest Ref Range: 1.7 - 7.7 K/uL 6.4   Lymphocyte # Latest Ref Range: 0.7 - 4.0 K/uL 1.8    Monocyte # Latest Ref Range: 0.1 - 1.0 K/uL 0.3   Eosinophils Absolute Latest Ref Range: 0.0 - 0.7 K/uL 0.0   Basophils Absolute Latest Ref Range: 0.0 - 0.1 K/uL 0.0

## 2017-05-22 ENCOUNTER — Ambulatory Visit (INDEPENDENT_AMBULATORY_CARE_PROVIDER_SITE_OTHER): Payer: 59 | Admitting: Internal Medicine

## 2017-05-22 ENCOUNTER — Encounter: Payer: Self-pay | Admitting: Internal Medicine

## 2017-05-22 DIAGNOSIS — I10 Essential (primary) hypertension: Secondary | ICD-10-CM

## 2017-05-22 DIAGNOSIS — K50013 Crohn's disease of small intestine with fistula: Secondary | ICD-10-CM

## 2017-05-22 DIAGNOSIS — E538 Deficiency of other specified B group vitamins: Secondary | ICD-10-CM

## 2017-05-22 DIAGNOSIS — M5440 Lumbago with sciatica, unspecified side: Secondary | ICD-10-CM | POA: Diagnosis not present

## 2017-05-22 DIAGNOSIS — F419 Anxiety disorder, unspecified: Secondary | ICD-10-CM | POA: Diagnosis not present

## 2017-05-22 DIAGNOSIS — G8929 Other chronic pain: Secondary | ICD-10-CM

## 2017-05-22 MED ORDER — HYDROCODONE-ACETAMINOPHEN 10-325 MG PO TABS: 1.0000 | ORAL_TABLET | Freq: Four times a day (QID) | ORAL | 0 refills | Status: DC | PRN

## 2017-05-22 MED ORDER — KETOROLAC TROMETHAMINE 10 MG PO TABS: 10.0000 mg | ORAL_TABLET | Freq: Four times a day (QID) | ORAL | 0 refills | Status: DC | PRN

## 2017-05-22 NOTE — Progress Notes (Signed)
Subjective:  Patient ID: [name] [name], female    DOB: 09-26-1970  Age: 47 y.o. MRN: [id number]  CC: No chief complaint on file.   HPI [name] [name] presents for anxiety, LBP, colitis f/u - on Entyvio.  Outpatient Medications Prior to Visit  Medication Sig Dispense Refill  . ALPRAZolam (XANAX) 0.5 MG tablet Take 1 tablet (0.5 mg total) by mouth at bedtime as needed. (Patient taking differently: Take 0.5 mg by mouth 2 (two) times daily. ) 30 tablet 2  . benazepril-hydrochlorthiazide (LOTENSIN HCT) 20-12.5 MG tablet TAKE 1 TABLET BY MOUTH DAILY. KEEP SCHEDULE APPT IN DEC FOR FUTURE REFILLS 90 tablet 3  . cyanocobalamin (,VITAMIN B-12,) 1000 MCG/ML injection Inject 1 mL (1,000 mcg total) into the skin every 14 (fourteen) days. 30 mL 2  . HYDROcodone-acetaminophen (NORCO) 10-325 MG tablet Take 1 tablet by mouth every 6 (six) hours as needed for severe pain. Please fill on or after 05/05/17 120 tablet 0  . ketorolac (TORADOL) 10 MG tablet Take 1 tablet (10 mg total) by mouth every 6 (six) hours as needed. for pain 20 tablet 0  . lamoTRIgine (LAMICTAL) 200 MG tablet Take 1 tablet by mouth daily.    . meperidine (DEMEROL) 50 MG tablet Take 1 tablet (50 mg total) by mouth daily as needed. For sever pain 20 tablet 0  . metroNIDAZOLE (METROGEL) 0.75 % vaginal gel Place 1 Applicatorful vaginally at bedtime. Apply one applicatorful to vagina at bedtime for 5 days 70 g 1  . norethindrone (AYGESTIN) 5 MG tablet Take 1 tablet (5 mg total) daily by mouth. 90 tablet 3  . Potassium Chloride ER 20 MEQ TBCR 2 PO BID FOR 3 DAYS 12 tablet 0  . promethazine (PHENERGAN) 25 MG tablet Take 1 tablet (25 mg total) by mouth 2 (two) times daily as needed for nausea. 60 tablet 0  . RaNITidine HCl (ZANTAC 75 PO) Take 1 tablet by mouth daily.    . sertraline (ZOLOFT) 100 MG tablet Take 100 mg by mouth 2 (two) times daily.      . sodium chloride 0.9 % SOLN 250 mL with vedolizumab 300 MG SOLR 300 mg Inject 300 mg into the  vein. LOADING DOSE THEN Q8 WEEKS    . vedolizumab (ENTYVIO) 300 MG injection Inject into the vein. Every 8 weeks    . benazepril-hydrochlorthiazide (LOTENSIN HCT) 20-12.5 MG tablet Take 1 tablet by mouth daily. Keep schedule appt in Dec for future refills 90 tablet 0  . ciprofloxacin (CIPRO) 500 MG tablet Take 1 tablet (500 mg total) by mouth 2 (two) times daily. One po bid x 7 days (Patient not taking: Reported on 04/18/2017) 20 tablet 0  . cyclobenzaprine (FLEXERIL) 10 MG tablet TAKE 1 TABLET BY MOUTH EVERYDAY AT BEDTIME (Patient not taking: Reported on 04/18/2017) 30 tablet 2   No facility-administered medications prior to visit.     ROS Review of Systems  Constitutional: Negative for activity change, appetite change, chills, fatigue and unexpected weight change.  HENT: Negative for congestion, mouth sores and sinus pressure.   Eyes: Negative for visual disturbance.  Respiratory: Negative for cough and chest tightness.   Gastrointestinal: Negative for abdominal pain and nausea.  Genitourinary: Negative for difficulty urinating, frequency and vaginal pain.  Musculoskeletal: Positive for back pain. Negative for gait problem.  Skin: Negative for pallor and rash.  Neurological: Negative for dizziness, tremors, weakness, numbness and headaches.  Psychiatric/Behavioral: Negative for confusion, sleep disturbance and suicidal ideas. The patient is nervous/anxious.  Objective:  BP 126/78 (BP Location: Left Arm, Patient Position: Sitting, Cuff Size: Large)   Pulse 85   Temp 98.9 F (37.2 C) (Oral)   Ht 5' 2"  (1.575 m)   Wt 156 lb (70.8 kg)   SpO2 98%   BMI 28.53 kg/m   BP Readings from Last 3 Encounters:  05/22/17 126/78  04/22/17 (!) 145/79  04/18/17 114/74    Wt Readings from Last 3 Encounters:  05/22/17 156 lb (70.8 kg)  04/18/17 152 lb 9.6 oz (69.2 kg)  03/07/17 157 lb (71.2 kg)    Physical Exam  Constitutional: She appears well-developed. No distress.  HENT:  Head:  Normocephalic.  Right Ear: External ear normal.  Left Ear: External ear normal.  Nose: Nose normal.  Mouth/Throat: Oropharynx is clear and moist.  Eyes: Pupils are equal, round, and reactive to light. Conjunctivae are normal. Right eye exhibits no discharge. Left eye exhibits no discharge.  Neck: Normal range of motion. Neck supple. No JVD present. No tracheal deviation present. No thyromegaly present.  Cardiovascular: Normal rate, regular rhythm and normal heart sounds.  Pulmonary/Chest: No stridor. No respiratory distress. She has no wheezes.  Abdominal: Soft. Bowel sounds are normal. She exhibits no distension and no mass. There is no tenderness. There is no rebound and no guarding.  Musculoskeletal: She exhibits tenderness. She exhibits no edema.  Lymphadenopathy:    She has no cervical adenopathy.  Neurological: She displays normal reflexes. No cranial nerve deficit. She exhibits normal muscle tone. Coordination normal.  Skin: No rash noted. No erythema.  Psychiatric: She has a normal mood and affect. Her behavior is normal. Judgment and thought content normal.  LS tender  Lab Results  Component Value Date   WBC 8.5 04/22/2017   HGB 15.5 (H) 04/22/2017   HCT 45.3 04/22/2017   PLT 120 (L) 04/22/2017   GLUCOSE 122 (H) 04/22/2017   CHOL 168 08/30/2014   TRIG 99.0 08/30/2014   HDL 27.40 (L) 08/30/2014   LDLDIRECT 171.9 05/01/2011   LDLCALC 121 (H) 08/30/2014   ALT 11 (L) 04/22/2017   AST 19 04/22/2017   NA 138 04/22/2017   K 2.9 (L) 04/22/2017   CL 102 04/22/2017   CREATININE 1.37 (H) 04/22/2017   BUN 17 04/22/2017   CO2 23 04/22/2017   TSH 1.82 05/28/2016    No results found.  Assessment & Plan:   There are no diagnoses linked to this encounter. I have discontinued [name] Berti. [name]'s ciprofloxacin and cyclobenzaprine. I am also having her maintain her sertraline, ALPRAZolam, lamoTRIgine, sodium chloride 0.9 % SOLN 250 mL with vedolizumab 300 MG SOLR 300 mg,  metroNIDAZOLE, RaNITidine HCl (ZANTAC 75 PO), vedolizumab, norethindrone, ketorolac, promethazine, benazepril-hydrochlorthiazide, meperidine, HYDROcodone-acetaminophen, cyanocobalamin, and Potassium Chloride ER.  No orders of the defined types were placed in this encounter.    Follow-up: No follow-ups on file.  [name], MD

## 2017-05-22 NOTE — Assessment & Plan Note (Signed)
Chronic Xanax prn  Potential benefits of a long term benzodiazepines  use as well as potential risks  and complications were explained to the patient and were aknowledged. Do not take w/pain meds. 

## 2017-05-22 NOTE — Assessment & Plan Note (Signed)
On Rx: Norco; Toradol, Demerol - rare prn   Chronic and severe On disabiity  Potential benefits of a long term opioids use as well as potential risks (i.e. addiction risk, apnea etc) and complications (i.e. Somnolence, constipation and others) were explained to the patient and were aknowledged.

## 2017-05-22 NOTE — Assessment & Plan Note (Signed)
B12 q 2 wks SQ

## 2017-05-22 NOTE — Assessment & Plan Note (Signed)
  Lotensin HCT 

## 2017-05-22 NOTE — Assessment & Plan Note (Signed)
On Entyvio IV q 2 mo 

## 2017-06-14 NOTE — Progress Notes (Signed)
Verified with Donna Christen in the pharmacy that Entyvio infusion is available for Monday.

## 2017-06-17 ENCOUNTER — Encounter (HOSPITAL_COMMUNITY)
Admission: RE | Admit: 2017-06-17 | Discharge: 2017-06-17 | Disposition: A | Discharge status: Home / Self Care | Payer: 59 | Source: Ambulatory Visit | Attending: Gastroenterology | Admitting: Gastroenterology

## 2017-06-17 ENCOUNTER — Encounter (HOSPITAL_COMMUNITY): Payer: Self-pay

## 2017-06-17 DIAGNOSIS — K5090 Crohn's disease, unspecified, without complications: Secondary | ICD-10-CM | POA: Diagnosis not present

## 2017-06-17 MED ORDER — SODIUM CHLORIDE 0.9 % IV SOLN
Freq: Once | INTRAVENOUS | Status: AC
  Administered 2017-06-17: 08:00:00 via INTRAVENOUS

## 2017-06-17 MED ORDER — METHYLPREDNISOLONE SODIUM SUCC 40 MG IJ SOLR
40.0000 mg | Freq: Once | INTRAMUSCULAR | Status: AC
  Administered 2017-06-17: 40 mg via INTRAVENOUS

## 2017-06-17 MED ORDER — METHYLPREDNISOLONE SODIUM SUCC 40 MG IJ SOLR
INTRAMUSCULAR | Status: AC
  Filled 2017-06-17: qty 1

## 2017-06-17 MED ORDER — VEDOLIZUMAB 300 MG IV SOLR
300.0000 mg | Freq: Once | INTRAVENOUS | Status: AC
  Administered 2017-06-17: 300 mg via INTRAVENOUS
  Filled 2017-06-17: qty 5

## 2017-07-11 ENCOUNTER — Telehealth: Payer: Self-pay | Admitting: Internal Medicine

## 2017-07-11 NOTE — Telephone Encounter (Signed)
Copied from [place] [id number]. Topic: Quick Communication - Rx Refill/Question >> Jul 11, 2017  2:52 PM Marin Olp L wrote: Medication: cipro (antibiotic)  Has the patient contacted their pharmacy? Yes.  (Agent: If no, request that the patient contact the pharmacy for the refill.) (Agent: If yes, when and what did the pharmacy advise?)  Preferred Pharmacy (with phone number or street name): [id number] [id number]: [phone]: [phone] Agent: Please be advised that RX refills may take up to 3 business days. We ask that you follow-up with your pharmacy.  IBS flare  up(chrons)

## 2017-07-11 NOTE — Telephone Encounter (Signed)
Will route pt request to office; medication d/c'd by Dr Alain Marion on 05/22/17.  Cipro LOV: 05/22/17 Last refill: ? PCP: Dr [facility]: [facility] [place]

## 2017-07-12 NOTE — Telephone Encounter (Signed)
If pt wants antibiotic, pt needs appt.

## 2017-07-16 NOTE — Telephone Encounter (Signed)
noted 

## 2017-07-16 NOTE — Telephone Encounter (Signed)
Spoke with patient, she states Dr,. Plot calls this in when she needs it sometimes. She has been informed he is out of office until June 5th. She is refused and appointment. She will call her GYN and they will take care of it.

## 2017-08-12 ENCOUNTER — Encounter (HOSPITAL_COMMUNITY)
Admission: RE | Admit: 2017-08-12 | Discharge: 2017-08-12 | Disposition: A | Discharge status: Home / Self Care | Payer: 59 | Source: Ambulatory Visit | Attending: Gastroenterology | Admitting: Gastroenterology

## 2017-08-12 ENCOUNTER — Telehealth: Payer: Self-pay | Admitting: Obstetrics and Gynecology

## 2017-08-12 DIAGNOSIS — N6311 Unspecified lump in the right breast, upper outer quadrant: Secondary | ICD-10-CM

## 2017-08-12 DIAGNOSIS — K5090 Crohn's disease, unspecified, without complications: Secondary | ICD-10-CM | POA: Insufficient documentation

## 2017-08-12 MED ORDER — SODIUM CHLORIDE 0.9 % IV SOLN
INTRAVENOUS | Status: DC
  Administered 2017-08-12: 250 mL via INTRAVENOUS

## 2017-08-12 MED ORDER — VEDOLIZUMAB 300 MG IV SOLR
300.0000 mg | Freq: Once | INTRAVENOUS | Status: AC
  Administered 2017-08-12: 300 mg via INTRAVENOUS
  Filled 2017-08-12: qty 5

## 2017-08-12 MED ORDER — METHYLPREDNISOLONE SODIUM SUCC 40 MG IJ SOLR
INTRAMUSCULAR | Status: AC
  Filled 2017-08-12: qty 1

## 2017-08-12 MED ORDER — METHYLPREDNISOLONE SODIUM SUCC 40 MG IJ SOLR
40.0000 mg | Freq: Once | INTRAMUSCULAR | Status: AC
  Administered 2017-08-12: 40 mg via INTRAVENOUS

## 2017-08-12 NOTE — Telephone Encounter (Addendum)
Pt states that she is due to have a diagnostic mammogram. Per chart review patient was recommended to have a right breast ultrasound. Will clarify with Dr. Glo Herring tomorrow.

## 2017-08-12 NOTE — Telephone Encounter (Signed)
Needs to have a diagnostic 3d  Mammo/ flu please call pt to get scheduled

## 2017-08-14 NOTE — Telephone Encounter (Signed)
Discussed patient with Dr. Glo Herring and he stated that patient just needs right breast ultrasound.

## 2017-08-19 ENCOUNTER — Encounter: Payer: Self-pay | Admitting: Internal Medicine

## 2017-08-19 ENCOUNTER — Ambulatory Visit (INDEPENDENT_AMBULATORY_CARE_PROVIDER_SITE_OTHER): Payer: 59 | Admitting: Internal Medicine

## 2017-08-19 DIAGNOSIS — M5440 Lumbago with sciatica, unspecified side: Secondary | ICD-10-CM | POA: Diagnosis not present

## 2017-08-19 DIAGNOSIS — I10 Essential (primary) hypertension: Secondary | ICD-10-CM | POA: Diagnosis not present

## 2017-08-19 DIAGNOSIS — F419 Anxiety disorder, unspecified: Secondary | ICD-10-CM

## 2017-08-19 DIAGNOSIS — R635 Abnormal weight gain: Secondary | ICD-10-CM

## 2017-08-19 DIAGNOSIS — K50013 Crohn's disease of small intestine with fistula: Secondary | ICD-10-CM | POA: Diagnosis not present

## 2017-08-19 DIAGNOSIS — G8929 Other chronic pain: Secondary | ICD-10-CM

## 2017-08-19 MED ORDER — HYDROCODONE-ACETAMINOPHEN 10-325 MG PO TABS: 1.0000 | ORAL_TABLET | Freq: Three times a day (TID) | ORAL | 0 refills | Status: DC | PRN

## 2017-08-19 MED ORDER — KETOROLAC TROMETHAMINE 10 MG PO TABS: 10.0000 mg | ORAL_TABLET | Freq: Four times a day (QID) | ORAL | 0 refills | Status: DC | PRN

## 2017-08-19 NOTE — Assessment & Plan Note (Signed)
Chronic Xanax prn  Potential benefits of a long term benzodiazepines  use as well as potential risks  and complications were explained to the patient and were aknowledged. Do not take w/pain meds.

## 2017-08-19 NOTE — Progress Notes (Signed)
Subjective:  Patient ID: [name] [name], female    DOB: March 25, 1970  Age: 47 y.o. MRN: [id number]  CC: No chief complaint on file.   HPI MARIEELENA BARTKO presents for colitis, LBP, HTN f/u. Well exam w/GYN  Outpatient Medications Prior to Visit  Medication Sig Dispense Refill  . ALPRAZolam (XANAX) 0.5 MG tablet Take 1 tablet (0.5 mg total) by mouth at bedtime as needed. (Patient taking differently: Take 0.5 mg by mouth 2 (two) times daily. ) 30 tablet 2  . benazepril-hydrochlorthiazide (LOTENSIN HCT) 20-12.5 MG tablet TAKE 1 TABLET BY MOUTH DAILY. KEEP SCHEDULE APPT IN DEC FOR FUTURE REFILLS 90 tablet 3  . cyanocobalamin (,VITAMIN B-12,) 1000 MCG/ML injection Inject 1 mL (1,000 mcg total) into the skin every 14 (fourteen) days. 30 mL 2  . lamoTRIgine (LAMICTAL) 200 MG tablet Take 1 tablet by mouth daily.    . meperidine (DEMEROL) 50 MG tablet Take 1 tablet (50 mg total) by mouth daily as needed. For sever pain 20 tablet 0  . metroNIDAZOLE (METROGEL) 0.75 % vaginal gel Place 1 Applicatorful vaginally at bedtime. Apply one applicatorful to vagina at bedtime for 5 days 70 g 1  . norethindrone (AYGESTIN) 5 MG tablet Take 1 tablet (5 mg total) daily by mouth. 90 tablet 3  . Potassium Chloride ER 20 MEQ TBCR 2 PO BID FOR 3 DAYS 12 tablet 0  . promethazine (PHENERGAN) 25 MG tablet Take 1 tablet (25 mg total) by mouth 2 (two) times daily as needed for nausea. 60 tablet 0  . RaNITidine HCl (ZANTAC 75 PO) Take 1 tablet by mouth daily.    . sertraline (ZOLOFT) 100 MG tablet Take 100 mg by mouth 2 (two) times daily.      . sodium chloride 0.9 % SOLN 250 mL with vedolizumab 300 MG SOLR 300 mg Inject 300 mg into the vein. LOADING DOSE THEN Q8 WEEKS    . vedolizumab (ENTYVIO) 300 MG injection Inject into the vein. Every 8 weeks    . HYDROcodone-acetaminophen (NORCO) 10-325 MG tablet Take 1 tablet by mouth every 6 (six) hours as needed for severe pain. Please fill on or after 08/05/17 120 tablet 0  .  ketorolac (TORADOL) 10 MG tablet Take 1 tablet (10 mg total) by mouth every 6 (six) hours as needed. for pain 20 tablet 0   No facility-administered medications prior to visit.     ROS: Review of Systems  Constitutional: Negative for activity change, appetite change, chills, fatigue and unexpected weight change.  HENT: Negative for congestion, mouth sores and sinus pressure.   Eyes: Negative for visual disturbance.  Respiratory: Negative for cough and chest tightness.   Gastrointestinal: Positive for abdominal pain. Negative for blood in stool, diarrhea, nausea and rectal pain.  Genitourinary: Negative for difficulty urinating, frequency and vaginal pain.  Musculoskeletal: Positive for arthralgias and back pain. Negative for gait problem.  Skin: Negative for pallor and rash.  Neurological: Negative for dizziness, tremors, weakness, numbness and headaches.  Psychiatric/Behavioral: Negative for confusion, sleep disturbance and suicidal ideas.    Objective:  BP 112/72 (BP Location: Left Arm, Patient Position: Sitting, Cuff Size: Normal)   Pulse (!) 105   Ht 5' 2"  (1.575 m)   Wt 161 lb (73 kg)   SpO2 96%   BMI 29.45 kg/m   BP Readings from Last 3 Encounters:  08/19/17 112/72  08/12/17 120/73  06/17/17 124/74    Wt Readings from Last 3 Encounters:  08/19/17 161 lb (73 kg)  06/17/17 156 lb (70.8 kg)  05/22/17 156 lb (70.8 kg)    Physical Exam  Constitutional: She appears well-developed. No distress.  HENT:  Head: Normocephalic.  Right Ear: External ear normal.  Left Ear: External ear normal.  Nose: Nose normal.  Mouth/Throat: Oropharynx is clear and moist.  Eyes: Pupils are equal, round, and reactive to light. Conjunctivae are normal. Right eye exhibits no discharge. Left eye exhibits no discharge.  Neck: Normal range of motion. Neck supple. No JVD present. No tracheal deviation present. No thyromegaly present.  Cardiovascular: Normal rate, regular rhythm and normal heart  sounds.  Pulmonary/Chest: No stridor. No respiratory distress. She has no wheezes.  Abdominal: Soft. Bowel sounds are normal. She exhibits no distension and no mass. There is tenderness. There is no rebound and no guarding.  Musculoskeletal: She exhibits no edema or tenderness.  Lymphadenopathy:    She has no cervical adenopathy.  Neurological: She displays normal reflexes. No cranial nerve deficit. She exhibits normal muscle tone. Coordination normal.  Skin: No rash noted. No erythema.  Psychiatric: She has a normal mood and affect. Her behavior is normal. Judgment and thought content normal.  LS tender  Lab Results  Component Value Date   WBC 8.5 04/22/2017   HGB 15.5 (H) 04/22/2017   HCT 45.3 04/22/2017   PLT 120 (L) 04/22/2017   GLUCOSE 122 (H) 04/22/2017   CHOL 168 08/30/2014   TRIG 99.0 08/30/2014   HDL 27.40 (L) 08/30/2014   LDLDIRECT 171.9 05/01/2011   LDLCALC 121 (H) 08/30/2014   ALT 11 (L) 04/22/2017   AST 19 04/22/2017   NA 138 04/22/2017   K 2.9 (L) 04/22/2017   CL 102 04/22/2017   CREATININE 1.37 (H) 04/22/2017   BUN 17 04/22/2017   CO2 23 04/22/2017   TSH 1.82 05/28/2016    No results found.  Assessment & Plan:   There are no diagnoses linked to this encounter.   Meds ordered this encounter  Medications  . ketorolac (TORADOL) 10 MG tablet    Sig: Take 1 tablet (10 mg total) by mouth every 6 (six) hours as needed. for pain    Dispense:  20 tablet    Refill:  0  . DISCONTD: HYDROcodone-acetaminophen (NORCO) 10-325 MG tablet    Sig: Take 1 tablet by mouth every 8 (eight) hours as needed for severe pain. Please fill on or after 09/07/17    Dispense:  90 tablet    Refill:  0  . DISCONTD: HYDROcodone-acetaminophen (NORCO) 10-325 MG tablet    Sig: Take 1 tablet by mouth every 8 (eight) hours as needed for severe pain. Please fill on or after 10/08/17    Dispense:  90 tablet    Refill:  0  . HYDROcodone-acetaminophen (NORCO) 10-325 MG tablet    Sig: Take 1  tablet by mouth every 8 (eight) hours as needed for severe pain. Please fill on or after 11/08/17    Dispense:  90 tablet    Refill:  0     Follow-up: Return for a follow-up visit.  [name], MD

## 2017-08-19 NOTE — Assessment & Plan Note (Signed)
Norco; Toradol, Demerol - rare prn Chronic and severe pain

## 2017-08-19 NOTE — Assessment & Plan Note (Signed)
On Entyvio IV q 2 mo 

## 2017-08-19 NOTE — Assessment & Plan Note (Signed)
Lotensin HCT

## 2017-08-19 NOTE — Assessment & Plan Note (Signed)
Wt Readings from Last 3 Encounters:  08/19/17 161 lb (73 kg)  06/17/17 156 lb (70.8 kg)  05/22/17 156 lb (70.8 kg)

## 2017-08-27 ENCOUNTER — Ambulatory Visit (HOSPITAL_COMMUNITY)
Admission: RE | Admit: 2017-08-27 | Discharge: 2017-08-27 | Disposition: A | Discharge status: Home / Self Care | Payer: 59 | Source: Ambulatory Visit | Attending: Obstetrics and Gynecology | Admitting: Obstetrics and Gynecology

## 2017-08-27 DIAGNOSIS — N6311 Unspecified lump in the right breast, upper outer quadrant: Secondary | ICD-10-CM

## 2017-08-27 DIAGNOSIS — N6001 Solitary cyst of right breast: Secondary | ICD-10-CM | POA: Insufficient documentation

## 2017-10-07 ENCOUNTER — Encounter (HOSPITAL_COMMUNITY): Payer: Self-pay

## 2017-10-07 ENCOUNTER — Encounter (HOSPITAL_COMMUNITY)
Admission: RE | Admit: 2017-10-07 | Discharge: 2017-10-07 | Disposition: A | Discharge status: Home / Self Care | Payer: 59 | Source: Ambulatory Visit | Attending: Gastroenterology | Admitting: Gastroenterology

## 2017-10-07 DIAGNOSIS — K5090 Crohn's disease, unspecified, without complications: Secondary | ICD-10-CM | POA: Diagnosis not present

## 2017-10-07 MED ORDER — SODIUM CHLORIDE 0.9 % IV SOLN
INTRAVENOUS | Status: DC
  Administered 2017-10-07: 08:00:00 via INTRAVENOUS

## 2017-10-07 MED ORDER — VEDOLIZUMAB 300 MG IV SOLR
300.0000 mg | Freq: Once | INTRAVENOUS | Status: AC
  Administered 2017-10-07: 300 mg via INTRAVENOUS
  Filled 2017-10-07: qty 5

## 2017-10-07 MED ORDER — METHYLPREDNISOLONE SODIUM SUCC 40 MG IJ SOLR
INTRAMUSCULAR | Status: AC
  Filled 2017-10-07: qty 1

## 2017-10-07 MED ORDER — METHYLPREDNISOLONE SODIUM SUCC 40 MG IJ SOLR
40.0000 mg | Freq: Once | INTRAMUSCULAR | Status: AC
  Administered 2017-10-07: 40 mg via INTRAVENOUS

## 2017-10-10 ENCOUNTER — Encounter: Payer: Self-pay | Admitting: Gastroenterology

## 2017-10-24 ENCOUNTER — Ambulatory Visit: Payer: 59 | Admitting: Gastroenterology

## 2017-11-07 ENCOUNTER — Ambulatory Visit: Payer: 59 | Admitting: Gastroenterology

## 2017-11-14 ENCOUNTER — Ambulatory Visit: Payer: 59 | Admitting: Gastroenterology

## 2017-11-25 ENCOUNTER — Encounter: Payer: Self-pay | Admitting: Internal Medicine

## 2017-11-25 ENCOUNTER — Ambulatory Visit (INDEPENDENT_AMBULATORY_CARE_PROVIDER_SITE_OTHER): Payer: 59 | Admitting: Internal Medicine

## 2017-11-25 ENCOUNTER — Other Ambulatory Visit (INDEPENDENT_AMBULATORY_CARE_PROVIDER_SITE_OTHER): Payer: 59

## 2017-11-25 DIAGNOSIS — E538 Deficiency of other specified B group vitamins: Secondary | ICD-10-CM

## 2017-11-25 DIAGNOSIS — M5440 Lumbago with sciatica, unspecified side: Secondary | ICD-10-CM | POA: Diagnosis not present

## 2017-11-25 DIAGNOSIS — G43109 Migraine with aura, not intractable, without status migrainosus: Secondary | ICD-10-CM | POA: Diagnosis not present

## 2017-11-25 DIAGNOSIS — K50013 Crohn's disease of small intestine with fistula: Secondary | ICD-10-CM

## 2017-11-25 DIAGNOSIS — G8929 Other chronic pain: Secondary | ICD-10-CM

## 2017-11-25 LAB — CBC WITH DIFFERENTIAL/PLATELET
Basophils Absolute: 0 10*3/uL (ref 0.0–0.1)
Basophils Relative: 0.3 % (ref 0.0–3.0)
Eosinophils Absolute: 0.1 10*3/uL (ref 0.0–0.7)
Eosinophils Relative: 0.7 % (ref 0.0–5.0)
HCT: 41.1 % (ref 36.0–46.0)
Hemoglobin: 14.7 g/dL (ref 12.0–15.0)
Lymphocytes Relative: 19.9 % (ref 12.0–46.0)
Lymphs Abs: 1.8 10*3/uL (ref 0.7–4.0)
MCHC: 35.8 g/dL (ref 30.0–36.0)
MCV: 89.8 fl (ref 78.0–100.0)
Monocytes Absolute: 0.5 10*3/uL (ref 0.1–1.0)
Monocytes Relative: 5.1 % (ref 3.0–12.0)
Neutro Abs: 6.7 10*3/uL (ref 1.4–7.7)
Neutrophils Relative %: 74 % (ref 43.0–77.0)
Platelets: 134 10*3/uL — ABNORMAL LOW (ref 150.0–400.0)
RBC: 4.58 Mil/uL (ref 3.87–5.11)
RDW: 12.6 % (ref 11.5–15.5)
WBC: 9 10*3/uL (ref 4.0–10.5)

## 2017-11-25 LAB — HEPATIC FUNCTION PANEL
ALT: 10 U/L (ref 0–35)
AST: 12 U/L (ref 0–37)
Albumin: 4.6 g/dL (ref 3.5–5.2)
Alkaline Phosphatase: 47 U/L (ref 39–117)
Bilirubin, Direct: 0.1 mg/dL (ref 0.0–0.3)
Total Bilirubin: 0.6 mg/dL (ref 0.2–1.2)
Total Protein: 7.3 g/dL (ref 6.0–8.3)

## 2017-11-25 LAB — BASIC METABOLIC PANEL
BUN: 17 mg/dL (ref 6–23)
CO2: 26 mEq/L (ref 19–32)
Calcium: 9.3 mg/dL (ref 8.4–10.5)
Chloride: 103 mEq/L (ref 96–112)
Creatinine, Ser: 1.36 mg/dL — ABNORMAL HIGH (ref 0.40–1.20)
GFR: 44.11 mL/min — ABNORMAL LOW (ref >60.00)
Glucose, Bld: 94 mg/dL (ref 70–99)
Potassium: 3.9 mEq/L (ref 3.5–5.1)
Sodium: 138 mEq/L (ref 135–145)

## 2017-11-25 LAB — VITAMIN B12: Vitamin B-12: 264 pg/mL (ref 211–911)

## 2017-11-25 MED ORDER — HYDROCODONE-ACETAMINOPHEN 10-325 MG PO TABS: 1.0000 | ORAL_TABLET | Freq: Three times a day (TID) | ORAL | 0 refills | Status: DC | PRN

## 2017-11-25 MED ORDER — CYCLOBENZAPRINE HCL 5 MG PO TABS: 5.0000 mg | ORAL_TABLET | Freq: Every evening | ORAL | 0 refills | Status: DC | PRN

## 2017-11-25 MED ORDER — KETOROLAC TROMETHAMINE 10 MG PO TABS: 10.0000 mg | ORAL_TABLET | Freq: Four times a day (QID) | ORAL | 0 refills | Status: DC | PRN

## 2017-11-25 NOTE — Assessment & Plan Note (Signed)
Better  

## 2017-11-25 NOTE — Assessment & Plan Note (Signed)
On Entyvio IV q 2 mo Labs

## 2017-11-25 NOTE — Assessment & Plan Note (Signed)
Labs

## 2017-11-25 NOTE — Progress Notes (Signed)
Subjective:  Patient ID: [name] [name], female    DOB: [birth year]  Age: 47 y.o. MRN: [id number]  CC: No chief complaint on file.   HPI ELAYSIA DEVARGAS presents for LBP, c/o back spasms. F/u colitis, HAs  Outpatient Medications Prior to Visit  Medication Sig Dispense Refill  . ALPRAZolam (XANAX) 0.5 MG tablet Take 1 tablet (0.5 mg total) by mouth at bedtime as needed. (Patient taking differently: Take 0.5 mg by mouth 2 (two) times daily. ) 30 tablet 2  . benazepril-hydrochlorthiazide (LOTENSIN HCT) 20-12.5 MG tablet TAKE 1 TABLET BY MOUTH DAILY. KEEP SCHEDULE APPT IN DEC FOR FUTURE REFILLS 90 tablet 3  . cyanocobalamin (,VITAMIN B-12,) 1000 MCG/ML injection Inject 1 mL (1,000 mcg total) into the skin every 14 (fourteen) days. 30 mL 2  . lamoTRIgine (LAMICTAL) 200 MG tablet Take 1 tablet by mouth daily.    . meperidine (DEMEROL) 50 MG tablet Take 1 tablet (50 mg total) by mouth daily as needed. For sever pain 20 tablet 0  . metroNIDAZOLE (METROGEL) 0.75 % vaginal gel Place 1 Applicatorful vaginally at bedtime. Apply one applicatorful to vagina at bedtime for 5 days 70 g 1  . norethindrone (AYGESTIN) 5 MG tablet Take 1 tablet (5 mg total) daily by mouth. 90 tablet 3  . Potassium Chloride ER 20 MEQ TBCR 2 PO BID FOR 3 DAYS 12 tablet 0  . promethazine (PHENERGAN) 25 MG tablet Take 1 tablet (25 mg total) by mouth 2 (two) times daily as needed for nausea. 60 tablet 0  . RaNITidine HCl (ZANTAC 75 PO) Take 1 tablet by mouth daily.    . sertraline (ZOLOFT) 100 MG tablet Take 100 mg by mouth 2 (two) times daily.      . sodium chloride 0.9 % SOLN 250 mL with vedolizumab 300 MG SOLR 300 mg Inject 300 mg into the vein. LOADING DOSE THEN Q8 WEEKS    . vedolizumab (ENTYVIO) 300 MG injection Inject into the vein. Every 8 weeks    . HYDROcodone-acetaminophen (NORCO) 10-325 MG tablet Take 1 tablet by mouth every 8 (eight) hours as needed for severe pain. Please fill on or after 11/08/17 90 tablet 0  .  ketorolac (TORADOL) 10 MG tablet Take 1 tablet (10 mg total) by mouth every 6 (six) hours as needed. for pain 20 tablet 0   No facility-administered medications prior to visit.     ROS: Review of Systems  Constitutional: Negative for activity change, appetite change, chills, fatigue and unexpected weight change.  HENT: Negative for congestion, mouth sores and sinus pressure.   Eyes: Negative for visual disturbance.  Respiratory: Negative for cough and chest tightness.   Gastrointestinal: Positive for abdominal pain and diarrhea. Negative for nausea.  Genitourinary: Negative for difficulty urinating, frequency and vaginal pain.  Musculoskeletal: Positive for arthralgias and back pain. Negative for gait problem.  Skin: Negative for pallor and rash.  Neurological: Negative for dizziness, tremors, weakness, numbness and headaches.  Psychiatric/Behavioral: Negative for confusion, sleep disturbance and suicidal ideas. The patient is nervous/anxious.     Objective:  BP 116/70 (BP Location: Left Arm, Patient Position: Sitting, Cuff Size: Normal)   Pulse 80   Temp 98 F (36.7 C) (Oral)   Ht 5\' 2"  (1.575 m)   Wt 160 lb (72.6 kg)   SpO2 96%   BMI 29.26 kg/m   BP Readings from Last 3 Encounters:  11/25/17 116/70  10/07/17 (!) 156/73  08/19/17 112/72    Wt Readings from Last  3 Encounters:  11/25/17 160 lb (72.6 kg)  10/07/17 160 lb (72.6 kg)  08/19/17 161 lb (73 kg)    Physical Exam  Constitutional: She appears well-developed. No distress.  HENT:  Head: Normocephalic.  Right Ear: External ear normal.  Left Ear: External ear normal.  Nose: Nose normal.  Mouth/Throat: Oropharynx is clear and moist.  Eyes: Pupils are equal, round, and reactive to light. Conjunctivae are normal. Right eye exhibits no discharge. Left eye exhibits no discharge.  Neck: Normal range of motion. Neck supple. No JVD present. No tracheal deviation present. No thyromegaly present.  Cardiovascular: Normal  rate, regular rhythm and normal heart sounds.  Pulmonary/Chest: No stridor. No respiratory distress. She has no wheezes.  Abdominal: Soft. Bowel sounds are normal. She exhibits no distension and no mass. There is no tenderness. There is no rebound and no guarding.  Musculoskeletal: She exhibits tenderness. She exhibits no edema.  Lymphadenopathy:    She has no cervical adenopathy.  Neurological: She displays normal reflexes. No cranial nerve deficit. She exhibits normal muscle tone. Coordination abnormal.  Skin: No rash noted. No erythema.  Psychiatric: She has a normal mood and affect. Her behavior is normal. Judgment and thought content normal.  LS tender  Lab Results  Component Value Date   WBC 8.5 04/22/2017   HGB 15.5 (H) 04/22/2017   HCT 45.3 04/22/2017   PLT 120 (L) 04/22/2017   GLUCOSE 122 (H) 04/22/2017   CHOL 168 08/30/2014   TRIG 99.0 08/30/2014   HDL 27.40 (L) 08/30/2014   LDLDIRECT 171.9 05/01/2011   LDLCALC 121 (H) 08/30/2014   ALT 11 (L) 04/22/2017   AST 19 04/22/2017   NA 138 04/22/2017   K 2.9 (L) 04/22/2017   CL 102 04/22/2017   CREATININE 1.37 (H) 04/22/2017   BUN 17 04/22/2017   CO2 23 04/22/2017   TSH 1.82 05/28/2016    No results found.  Assessment & Plan:   There are no diagnoses linked to this encounter.   Meds ordered this encounter  Medications  . DISCONTD: HYDROcodone-acetaminophen (NORCO) 10-325 MG tablet    Sig: Take 1 tablet by mouth every 8 (eight) hours as needed for severe pain. Please fill on or after 11/08/17    Dispense:  90 tablet    Refill:  0  . ketorolac (TORADOL) 10 MG tablet    Sig: Take 1 tablet (10 mg total) by mouth every 6 (six) hours as needed. for pain    Dispense:  20 tablet    Refill:  0  . DISCONTD: HYDROcodone-acetaminophen (NORCO) 10-325 MG tablet    Sig: Take 1 tablet by mouth every 8 (eight) hours as needed for severe pain. Please fill on or after 12/08/17    Dispense:  90 tablet    Refill:  0  . DISCONTD:  HYDROcodone-acetaminophen (NORCO) 10-325 MG tablet    Sig: Take 1 tablet by mouth every 8 (eight) hours as needed for severe pain. Please fill on or after 01/08/18    Dispense:  90 tablet    Refill:  0  . HYDROcodone-acetaminophen (NORCO) 10-325 MG tablet    Sig: Take 1 tablet by mouth every 8 (eight) hours as needed for severe pain. Please fill on or after 02/07/18    Dispense:  90 tablet    Refill:  0  . cyclobenzaprine (FLEXERIL) 5 MG tablet    Sig: Take 1 tablet (5 mg total) by mouth at bedtime as needed for muscle spasms.    Dispense:  30 tablet    Refill:  0     Follow-up: No follow-ups on file.  [name], MD

## 2017-11-25 NOTE — Assessment & Plan Note (Addendum)
Norco; Toradol, Demerol - rare prn Chronic and severe On disabiity  Potential benefits of a long term opioids use as well as potential risks (i.e. addiction risk, apnea etc) and complications (i.e. Somnolence, constipation and others) were explained to the patient and were aknowledged. Flexeril at hs prn - low dose

## 2017-12-02 ENCOUNTER — Encounter (HOSPITAL_COMMUNITY)
Admission: RE | Admit: 2017-12-02 | Discharge: 2017-12-02 | Disposition: A | Discharge status: Home / Self Care | Payer: 59 | Source: Ambulatory Visit | Attending: Gastroenterology | Admitting: Gastroenterology

## 2017-12-02 DIAGNOSIS — K5090 Crohn's disease, unspecified, without complications: Secondary | ICD-10-CM | POA: Diagnosis present

## 2017-12-02 MED ORDER — SODIUM CHLORIDE 0.9 % IV SOLN
INTRAVENOUS | Status: DC
  Administered 2017-12-02: 08:00:00 via INTRAVENOUS

## 2017-12-02 MED ORDER — VEDOLIZUMAB 300 MG IV SOLR
300.0000 mg | Freq: Once | INTRAVENOUS | Status: AC
  Administered 2017-12-02: 300 mg via INTRAVENOUS
  Filled 2017-12-02: qty 5

## 2017-12-02 MED ORDER — METHYLPREDNISOLONE SODIUM SUCC 40 MG IJ SOLR
40.0000 mg | Freq: Once | INTRAMUSCULAR | Status: AC
  Administered 2017-12-02: 40 mg via INTRAVENOUS
  Filled 2017-12-02: qty 1

## 2017-12-04 ENCOUNTER — Telehealth: Payer: Self-pay

## 2017-12-04 MED ORDER — ESOMEPRAZOLE MAGNESIUM 40 MG PO CPDR: DELAYED_RELEASE_CAPSULE | ORAL | 11 refills | Status: DC

## 2017-12-04 NOTE — Telephone Encounter (Signed)
Pt left VM that she would like for Dr. Oneida Alar to send in a prescription for generic Nexium to her pharmacy.  She had been taking Zantac 75 mg and it was taken off of the shelf.  [facility] [place], [place].

## 2017-12-04 NOTE — Telephone Encounter (Signed)
REVIEWED.  

## 2017-12-04 NOTE — Telephone Encounter (Signed)
PLEASE CALL PT.  Rx sent.  

## 2017-12-05 NOTE — Telephone Encounter (Signed)
PT is aware.

## 2017-12-09 ENCOUNTER — Telehealth: Payer: Self-pay

## 2017-12-09 NOTE — Telephone Encounter (Signed)
I have started the process for PA for Entyvio.

## 2017-12-17 ENCOUNTER — Telehealth: Payer: Self-pay | Admitting: Internal Medicine

## 2017-12-17 NOTE — Telephone Encounter (Signed)
Copied from [place] [id number]. Topic: General - Other >> Dec 17, 2017 10:41 AM Yvette Rack wrote: Reason for CRM: Pt states she needs a Rx for Cipro and Diflacan due to a Crohn's flare up. Pt requests a call back. Cb# [phone]

## 2017-12-17 NOTE — Telephone Encounter (Signed)
Please advise 

## 2017-12-20 MED ORDER — CIPROFLOXACIN HCL 500 MG PO TABS: 500.0000 mg | ORAL_TABLET | Freq: Two times a day (BID) | ORAL | 0 refills | Status: DC

## 2017-12-20 MED ORDER — FLUCONAZOLE 150 MG PO TABS: 150.0000 mg | ORAL_TABLET | Freq: Once | ORAL | 1 refills | Status: AC

## 2017-12-20 NOTE — Telephone Encounter (Signed)
Ok Thx 

## 2017-12-21 ENCOUNTER — Other Ambulatory Visit: Payer: Self-pay | Admitting: Internal Medicine

## 2017-12-25 ENCOUNTER — Telehealth: Payer: Self-pay

## 2017-12-25 NOTE — Telephone Encounter (Signed)
I faxed a request to [facility] for paperwork to do PA for Entyvio. I received the fax this Am and have filled out and faxed back to them.

## 2017-12-25 NOTE — Telephone Encounter (Signed)
I received another fax, as if I had not faxed the into. I called [phone] and spoke to [place] who took info and submitted request on phone. Case # [id number]. Should hear by fax within 72 hours.

## 2017-12-26 NOTE — Telephone Encounter (Signed)
I have been directed to call [facility] for info about who is responsible for paying for Entyvio. I have left VM at [place] for a return call.  Pt had [employer] as primary under her husband and also has Medicare Part B and D.

## 2018-01-01 NOTE — Telephone Encounter (Signed)
Called [facility] and was told by Maudie Mercury to call [employer] @ [phone]. I called and was on HOLD for over 15 min and had to hang up to assist pt's. I have left Vm for pt to call me to see if she can talk to her ins co and help out.

## 2018-01-01 NOTE — Telephone Encounter (Signed)
I was told by [place] that the Primary would be responsible for the payment, which is [facility].

## 2018-01-01 NOTE — Telephone Encounter (Signed)
PT called and gave me a number to call of [phone].

## 2018-01-02 ENCOUNTER — Encounter

## 2018-01-02 ENCOUNTER — Encounter: Payer: Self-pay | Admitting: Gastroenterology

## 2018-01-02 ENCOUNTER — Ambulatory Visit (INDEPENDENT_AMBULATORY_CARE_PROVIDER_SITE_OTHER): Payer: 59 | Admitting: Gastroenterology

## 2018-01-02 DIAGNOSIS — K50013 Crohn's disease of small intestine with fistula: Secondary | ICD-10-CM

## 2018-01-02 DIAGNOSIS — N824 Other female intestinal-genital tract fistulae: Secondary | ICD-10-CM | POA: Diagnosis not present

## 2018-01-02 DIAGNOSIS — K219 Gastro-esophageal reflux disease without esophagitis: Secondary | ICD-10-CM

## 2018-01-02 NOTE — Telephone Encounter (Signed)
PA attempted on Cover My Meds on 01/01/2018.  Should have a response in 1-5 business days.

## 2018-01-02 NOTE — Progress Notes (Signed)
Subjective:    Patient ID: [name] [name], female    DOB: Jan 17, 1971, 47 y.o.   MRN: [id number]  Plotnikov, Evie Lacks, MD   HPI BOWELS ARE GOOD. BMs: 1-2X/DAY, #4. HAVING JOINT PAINS EVERYWHERE. FEELS IT IN HER HIPS/KNEES BOTH SIDES. HAPPENS EVERY 3 DAYS USUALLY AND NOT ASSOCIATED WITH ENTYVIO. BEEN GOING ON FOR 8 MOS. MAY WAKE HER UP IN THE MIDDLE OF THE NIGHT WHEN HER HIPS HURT. FEVER(BREAKS OUT IN A SWEAT-ONE NIGHT/ONCE A MO) CHILLS ONCE A MONTH.  TWICE FELT TINGLING/SUNBURN. OCCASIONAL DRAINAGE OUT OF VAGINA AND NOW IT'S BETTER BUT NOT GONE. GETS BLOATED OFTEN. HEARTBURN CONTROLLED. STILL SMOKING < 1 PK/DAY.   PT DENIES FEVER, CHILLS, HEMATOCHEZIA, HEMATEMESIS, nausea, vomiting, melena, diarrhea, CHEST PAIN, SHORTNESS OF BREATH, SORES IN MOUTH, RASH ON LEGS, CHANGE IN BOWEL IN HABITS, constipation, abdominal pain, problems swallowing, OR heartburn or indigestion.  Past Medical History:  Diagnosis Date  . Anxiety   . Arthritis    Dr. Eddie Dibbles  . Bartholin cyst 2008   vag.  [name] BIPOLAR AFFECTIVE DISORDER 04/14/2007  . Crohn's ON ENTYVIO SINCE FEB [id number]   COMPLICATED BY RECTOVAGINAL FISTULA. SJS WITH REMICADE. FAILED HUMIRA.  [name] Depression   . Elevated glucose 2010  . GERD (gastroesophageal reflux disease)   . Hypertension   . Kidney stone   . LBP (low back pain)    Dr. Mina Marble  . Perianal abscess 2009  . Vitamin B12 deficiency    Past Surgical History:  Procedure Laterality Date  . COLONOSCOPY  09/2008   Dr. Derrill Kay: ulcers, edema, possible fistula openings all seen in the distal 3 cm of anus/anal canal. 1cm pseudopolyp. rest of colon and TI normal. rectal biopsy with mild chronic active colitis. ileum bx normal.  . COLONOSCOPY WITH PROPOFOL N/A 03/29/2015   SLF: Severe proctocolitis limitied to cecum and rectum. Limited exam of the colon mucosa and anal canal.   . ELBOW SURGERY     left  . ESOPHAGOGASTRODUODENOSCOPY (EGD) WITH PROPOFOL N/A 03/29/2015   SLF: 1. Erosive gastritis  duodentitis.   [name] RECTOVAGINAL FISTULA CLOSURE     did not help   Allergies  Allergen Reactions  . Cephalexin     itching  . Codeine   . Guaifenesin   . Imitrex [Sumatriptan]     2 fingers got very hot  . Morphine   . 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 Sulfonamide Derivatives   . Ultram [Tramadol Hcl]     [employer] syndrome with either remicade or ultram    Current Outpatient Medications  Medication Sig    . ALPRAZolam (XANAX) 0.5 MG tablet Take 1 tablet (0.5 mg total) by mouth at bedtime as needed. (Patient taking differently: Take 0.5 mg by mouth 2 (two) times daily. )    . benazepril-hydrochlorthiazide (LOTENSIN HCT) 20-12.5 MG tablet TAKE 1 TABLET BY MOUTH DAILY. KEEP SCHEDULE APPT IN DEC FOR FUTURE REFILLS    . cyanocobalamin (,VITAMIN B-12,) 1000 MCG/ML injection Inject 1 mL (1,000 mcg total) into the skin every 14 (fourteen) days.    . cyclobenzaprine (FLEXERIL) 5 MG tablet TAKE 1 TABLET (5 MG TOTAL) BY MOUTH AT BEDTIME AS NEEDED FOR MUSCLE SPASMS.    [name] esomeprazole (NEXIUM) 40 MG capsule 1 PO 30 MINS PRIOR TO FIRST MEAL    . HYDROcodone-acetaminophen (NORCO) 10-325 MG tablet Take 1 tablet by mouth every 8 (eight) hours as needed for severe pain. Please  fill on or after 02/07/18    . TORADOL  FOR VAGINAL PAIN   . lamoTRIgine (LAMICTAL) 200 MG tablet Take 1 tablet by mouth daily.    . norethindrone (AYGESTIN) 5 MG tablet Take 1 tablet (5 mg total) daily by mouth.    . promethazine (PHENERGAN) 25 MG tablet Take 1 tablet (25 mg total) by mouth 2 (two) times daily as needed for nausea. NOT USING   . sertraline (ZOLOFT) 100 MG tablet Take 100 mg by mouth 2 (two) times daily.      .      . vedolizumab (ENTYVIO) 300 MG injection Inject into the vein. Every 8 weeks    .      .      .      .      [name] RaNITidine HCl (ZANTAC 75 PO) Take 1 tablet by mouth daily.     Review of Systems PER HPI OTHERWISE ALL SYSTEMS ARE  NEGATIVE.    Objective:   Physical Exam  Constitutional: She is oriented to person, place, and time. She appears well-developed and well-nourished. No distress.  HENT:  Head: Normocephalic and atraumatic.  Mouth/Throat: Oropharynx is clear and moist. No oropharyngeal exudate.  Eyes: Pupils are equal, round, and reactive to light. No scleral icterus.  Neck: Normal range of motion. Neck supple.  Cardiovascular: Normal rate, regular rhythm and normal heart sounds.  Pulmonary/Chest: Effort normal and breath sounds normal. No respiratory distress.  Abdominal: Soft. Bowel sounds are normal. She exhibits no distension. There is no tenderness.  Musculoskeletal: She exhibits no edema.  Lymphadenopathy:    She has no cervical adenopathy.  Neurological: She is alert and oriented to person, place, and time.  NO FOCAL DEFICITS  Psychiatric:  FLAT AFFECT, SLIGHTLY ANXIOUS MOOD  Vitals reviewed.     Assessment & Plan:

## 2018-01-02 NOTE — Progress Notes (Signed)
ON RECALL  °

## 2018-01-02 NOTE — Progress Notes (Signed)
cc'ed to pcp °

## 2018-01-02 NOTE — Patient Instructions (Addendum)
AVOID ITEMS THAT CAUSE BLOATING & GAS. SEE INFO BELOW.   CONTINUE EFFORTS TO QUIT SMOKING.  CONTINUE ENTYVIO.  CONSIDER REPEAT COLONOSCOPY IN 2020.  CONTINUE NEXIUM. TAKE 30 MINUTES BEFORE FIRST MEAL.   FOLLOW UP IN 6 MOS.   PLEASE CALL WITH QUESTIONS OR CONCERNS.   BLOATING AND GAS PREVENTION  Although gas may be uncomfortable and embarrassing, it is not life-threatening. Understanding causes, ways to reduce symptoms, and treatment will help most people find some relief.  Points to remember . Everyone has gas in the digestive tract. [name] People often believe normal passage of gas to be excessive. . Gas comes from two main sources: swallowed air and normal breakdown of certain foods by harmless bacteria naturally present in the large intestine. . Many foods with carbohydrates can cause gas. Fats and proteins cause little gas. . Foods that may cause gas include o beans  o vegetables, such as broccoli, cabbage, brussels sprouts, onions, artichokes, and asparagus  o fruits, such as pears, apples, and peaches  o whole grains, such as whole wheat and bran  o soft drinks and fruit drinks  o milk and milk products, such as cheese and ice cream, and packaged foods prepared with lactose, such as bread, cereal, and salad dressing  o foods containing sorbitol, such as dietetic foods and sugar free candies and gums . The most common symptoms of gas are belching, flatulence, bloating, and abdominal pain. However, some of these symptoms are often caused by an intestinal disorder, such as irritable bowel syndrome, rather than too much gas. . The most common ways to reduce the discomfort of gas are changing diet, taking nonprescription medicines, and reducing the amount of air swallowed. . Digestive enzymes, such as lactase supplements, actually help digest carbohydrates and may allow people to eat foods that normally cause gas.

## 2018-01-02 NOTE — Assessment & Plan Note (Signed)
SYMPTOMS FAIRLY WELL CONTROLLED BUT NOT RESOLVED. CLINICALLY IMPROVED.  CONTINUE TO MONITOR SYMPTOMS.

## 2018-01-02 NOTE — Assessment & Plan Note (Signed)
SYMPTOMS CONTROLLED/RESOLVED.  CONTINUE NEXIUM. TAKE 30 MINUTES BEFORE FIRST MEAL. ZANTAC QHS FOLLOW UP IN 6 MOS

## 2018-01-02 NOTE — Assessment & Plan Note (Addendum)
SYMPTOMS FAIRLY WELL CONTROLLED. LAST TCS 2017: CECITIS/PROCTITIS. LAST DEXA 2017: NL. B12/VIT D NL IN 2019.  FLU/PNEUMONIA SHOT UTD. AVOID ITEMS THAT CAUSE BLOATING & GAS.  HANDOUT GIVEN. CONTINUE EFFORTS TO QUIT SMOKING. CONTINUE ENTYVIO. REPEAT COLONOSCOPY IN 2020. FOLLOW UP IN 6 MOS.   PLEASE CALL WITH QUESTIONS OR CONCERNS.

## 2018-01-06 NOTE — Telephone Encounter (Signed)
Received fax from [place] that the coverage is denied. Said to call [employer] @ [phone]. I called and spoke to Helene Kelp B who said that I could call [facility] @ [phone]. Discuss with them and let them know that [employer] has denied. If they fail to cooperate by phone fax appeals and grivances to [phone] Include pt's name, dob, and member ID.

## 2018-01-06 NOTE — Telephone Encounter (Signed)
I called [employer] @ [phone]  And spoke to [facility]. She said provider would have to go online to [facility] [email or web address] to this. Dr. Oneida Alar, I have the fax number to do appeals and grievances if you will write a letter for me to fax.

## 2018-01-08 NOTE — Telephone Encounter (Signed)
Letter has been faxed to [facility] and [place] @ [phone].

## 2018-01-08 NOTE — Telephone Encounter (Signed)
LETTER WRITTEN. SEND TO [employer].

## 2018-01-09 ENCOUNTER — Encounter: Payer: Self-pay | Admitting: Obstetrics and Gynecology

## 2018-01-09 ENCOUNTER — Other Ambulatory Visit (HOSPITAL_COMMUNITY)
Admission: RE | Admit: 2018-01-09 | Discharge: 2018-01-09 | Disposition: A | Discharge status: Home / Self Care | Payer: 59 | Source: Ambulatory Visit | Attending: Obstetrics and Gynecology | Admitting: Obstetrics and Gynecology

## 2018-01-09 ENCOUNTER — Ambulatory Visit (INDEPENDENT_AMBULATORY_CARE_PROVIDER_SITE_OTHER): Payer: 59 | Admitting: Obstetrics and Gynecology

## 2018-01-09 VITALS — BP 135/82 | HR 84 | Ht 62.0 in | Wt 161.0 lb

## 2018-01-09 DIAGNOSIS — K50111 Crohn's disease of large intestine with rectal bleeding: Secondary | ICD-10-CM

## 2018-01-09 DIAGNOSIS — Z01419 Encounter for gynecological examination (general) (routine) without abnormal findings: Secondary | ICD-10-CM

## 2018-01-09 MED ORDER — NORETHINDRONE ACETATE 5 MG PO TABS: 5.0000 mg | ORAL_TABLET | Freq: Every day | ORAL | 3 refills | Status: DC

## 2018-01-09 MED ORDER — CIPROFLOXACIN HCL 500 MG PO TABS: 500.0000 mg | ORAL_TABLET | Freq: Two times a day (BID) | ORAL | 1 refills | Status: DC

## 2018-01-09 MED ORDER — DOXYCYCLINE HYCLATE 100 MG PO CAPS: 100.0000 mg | ORAL_CAPSULE | Freq: Two times a day (BID) | ORAL | 0 refills | Status: DC

## 2018-01-09 NOTE — Progress Notes (Signed)
Subjective:     [name] [name] is a 47 y.o. female here for a routine exam she has longstanding history of Crohn's disease with 2 left sided recurrent fistulas that are currently stable and quiet.  I usually refill her Cipro so that she can begin it as soon as she becomes tender and symptomatic from the fistulae..  Current complaints: She has a stye in the right eye which is tender.  She cannot use sulfonamide so we will treat her systemically.  Personal health questionnaire reviewed: no.   Gynecologic History No LMP recorded. (Menstrual status: Oral contraceptives). Contraception: abstinence Last Pap: 2018. Results were: normal will repeat Pap at this time but go to every 2 years Last mammogram: 12/2016. Results were: Notable for small complicated cyst which will be followed up later this year  Obstetric History OB History  Gravida Para Term Preterm AB Living  0 0 0 0 0 0  SAB TAB Ectopic Multiple Live Births  0 0 0 0 0       Review of Systems  Review of Systems  Constitutional: Negative for fever, chills, weight loss, malaise/fatigue and diaphoresis.  HENT: Negative for hearing loss, ear pain, nosebleeds, congestion, sore throat, neck pain, tinnitus and ear discharge.   Eyes: Negative for blurred vision, double vision, photophobia, pain, discharge and redness.  Respiratory: Negative for cough, hemoptysis, sputum production, shortness of breath, wheezing and stridor.   Cardiovascular: Negative for chest pain, palpitations, orthopnea, claudication, leg swelling and PND.  Gastrointestinal: negative for abdominal pain. Negative for heartburn, nausea, vomiting, diarrhea, constipation, blood in stool and melena.  Genitourinary: Negative for dysuria, urgency, frequency, hematuria and flank pain.  Musculoskeletal: Negative for myalgias, back pain, joint pain and falls.  Skin: Negative for itching and rash.  Neurological: Negative for dizziness, tingling, tremors, sensory change, speech  change, focal weakness, seizures, loss of consciousness, weakness and headaches.  Endo/Heme/Allergies: Negative for environmental allergies and polydipsia. Does not bruise/bleed easily.  Psychiatric/Behavioral: Negative for depression, suicidal ideas, hallucinations, memory loss and substance abuse. The patient is not nervous/anxious and does not have insomnia.        Objective:    Physical Exam  Vitals reviewed. Constitutional: She is oriented to person, place, and time. She appears well-developed and well-nourished.  HENT:  Head: Normocephalic and atraumatic.        Right Ear: External ear normal.  Left Ear: External ear normal.  Nose: Nose normal.  Mouth/Throat: Oropharynx is clear and moist.  Eyes: Conjunctivae and EOM are normal. Pupils are equal, round, and reactive to light. Right eye exhibits no discharge. Left eye exhibits no discharge. No scleral icterus.  Breast exam was not performed per patient preference.  She did not undress to allow exam Respiratory: Effort normal and breath sounds normal. No respiratory distress.[name]  GI: Soft. Bowel sounds are normal. She exhibits no distension and no mass. There is no tenderness. There is no rebound and no guarding.  Genitourinary:       Vulva is normal without lesions Vagina is pink moist without discharge Cervix normal in appearance and pap is done Uterus is normal size shape and contour retroverted Adnexa is negative with normal sized ovaries by sonogram  Musculoskeletal: Normal range of motion. She exhibits no edema and no tenderness.  Neurological: She is alert and oriented to person, place, and time. She has normal reflexes. She displays normal reflexes. No cranial nerve deficit. She exhibits normal muscle tone. Coordination normal.  Skin: Skin is warm and dry.  No rash noted. No erythema. No pallor.  Psychiatric: She has a normal mood and affect. Her behavior is normal. Judgment and thought content normal.       Assessment:     Healthy female exam.   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    refil cipro for fistulae use prn Doxy x 10 d for stye.

## 2018-01-09 NOTE — Patient Instructions (Signed)

## 2018-01-12 LAB — CYTOLOGY - PAP
Diagnosis: NEGATIVE
HPV: NOT DETECTED

## 2018-01-21 ENCOUNTER — Other Ambulatory Visit: Payer: Self-pay | Admitting: Internal Medicine

## 2018-01-22 NOTE — Telephone Encounter (Signed)
I received a letter from [facility] that they received the letter from Dr. Oneida Alar that I faxed on 01/08/2018. I called [employer] @ [phone] and spoke to [place] who said that the pt/info did not qualify for an appeal. Michela Pitcher it is being reviewed by [facility] and we should know something in 5-15 days. I asked for a number for the doctor to discuss with someone, told her the pt is scheduled for her next treatment on 01/28/2018 and we need to know. She said there was nothing we could do, we just have to wait. Dr. Oneida Alar, do I just need to cancel her [name] that is scheduled for 01/28/2018???

## 2018-01-22 NOTE — Telephone Encounter (Signed)
I called and left VM for [name] [name] that we are still waiting on insurance co and to please cancel the appt on 01/28/2018 and reschedule for 02/04/2018.   Will wait til I hear back to let pt know.

## 2018-01-22 NOTE — Telephone Encounter (Signed)
PLEASE RESCHEDULE ENTYVIO FOR DEC 17. CALL PT AND LET HER KNOW.

## 2018-01-22 NOTE — Telephone Encounter (Signed)
Per Hoyle Sauer she can schedule pt for 02/05/2018 at 8:00 AM.

## 2018-01-23 ENCOUNTER — Other Ambulatory Visit: Payer: Self-pay

## 2018-01-23 NOTE — Telephone Encounter (Signed)
Pt is aware of the changed appt. She said that her August bill was not pd due to a mix up about who was primary on her ins. Her [employer] is primary. She gave me a number of [phone] to call if we don't hear something in a couple of days.

## 2018-01-28 ENCOUNTER — Encounter (HOSPITAL_COMMUNITY): Payer: 59

## 2018-02-03 NOTE — Telephone Encounter (Signed)
REVIEWED.  

## 2018-02-03 NOTE — Telephone Encounter (Signed)
I was on the phone a total of 51 min, being transferred etc. Now I am told they do not have a PA  Created for this pt's Entyvio for this year, just last year. Almyra Free, please see previous documentation and advise!

## 2018-02-03 NOTE — Telephone Encounter (Signed)
I called and spoke to [facility] at [facility]. She gave me a number that is dedicated to this type autthorization. [phone].

## 2018-02-03 NOTE — Telephone Encounter (Addendum)
I called this number and was told by Patrici Ranks T that this is [employer]. She told me to call [phone], to [facility], and she then transferred me.

## 2018-02-03 NOTE — Telephone Encounter (Signed)
After speaking with multiple people at [facility] and trying to get online and put the PA in ( the [facility] system website was down today due to updates to their system), I called [place] and spoke with Jillyn Hidden. She got all the information and the infusions are approved for 1 year. The auth number is [id number] for 02/03/18-02/04/19. I called endo and LM with the auth number.

## 2018-02-04 NOTE — Telephone Encounter (Signed)
PT is aware and I have faxed the orders to Endo to [place].

## 2018-02-04 NOTE — Telephone Encounter (Signed)
REVIEWED-NO ADDITIONAL RECOMMENDATIONS. 

## 2018-02-04 NOTE — Telephone Encounter (Signed)
I sent Dr. Oneida Alar a message to inquire if pt needs the claritin 10 mg and tylenol 650 mg with the solumedrol for pre-meds tomorrow. She sent me a text back that she did want those and she has signed the orders for Endo.

## 2018-02-05 ENCOUNTER — Encounter (HOSPITAL_COMMUNITY): Payer: Self-pay

## 2018-02-05 ENCOUNTER — Encounter (HOSPITAL_COMMUNITY)
Admission: RE | Admit: 2018-02-05 | Discharge: 2018-02-05 | Disposition: A | Discharge status: Home / Self Care | Payer: 59 | Source: Ambulatory Visit | Attending: Gastroenterology | Admitting: Gastroenterology

## 2018-02-05 DIAGNOSIS — K5090 Crohn's disease, unspecified, without complications: Secondary | ICD-10-CM | POA: Diagnosis not present

## 2018-02-05 MED ORDER — SODIUM CHLORIDE 0.9 % IV SOLN
INTRAVENOUS | Status: DC
  Administered 2018-02-05: 08:00:00 via INTRAVENOUS

## 2018-02-05 MED ORDER — METHYLPREDNISOLONE SODIUM SUCC 40 MG IJ SOLR
40.0000 mg | Freq: Once | INTRAMUSCULAR | Status: AC
  Administered 2018-02-05: 40 mg via INTRAVENOUS
  Filled 2018-02-05: qty 1

## 2018-02-05 MED ORDER — VEDOLIZUMAB 300 MG IV SOLR
300.0000 mg | Freq: Once | INTRAVENOUS | Status: AC
  Administered 2018-02-05: 300 mg via INTRAVENOUS
  Filled 2018-02-05: qty 5

## 2018-02-24 ENCOUNTER — Encounter: Payer: Self-pay | Admitting: Internal Medicine

## 2018-02-24 ENCOUNTER — Ambulatory Visit (INDEPENDENT_AMBULATORY_CARE_PROVIDER_SITE_OTHER): Payer: 59 | Admitting: Internal Medicine

## 2018-02-24 DIAGNOSIS — K50013 Crohn's disease of small intestine with fistula: Secondary | ICD-10-CM | POA: Diagnosis not present

## 2018-02-24 DIAGNOSIS — I10 Essential (primary) hypertension: Secondary | ICD-10-CM

## 2018-02-24 MED ORDER — CYCLOBENZAPRINE HCL 10 MG PO TABS: 10.0000 mg | ORAL_TABLET | Freq: Three times a day (TID) | ORAL | 1 refills | Status: DC | PRN

## 2018-02-24 MED ORDER — HYDROCODONE-ACETAMINOPHEN 10-325 MG PO TABS: 1.0000 | ORAL_TABLET | Freq: Three times a day (TID) | ORAL | 0 refills | Status: DC | PRN

## 2018-02-24 MED ORDER — ERYTHROMYCIN 5 MG/GM OP OINT: 1.0000 "application " | TOPICAL_OINTMENT | Freq: Four times a day (QID) | OPHTHALMIC | 0 refills | Status: DC

## 2018-02-24 NOTE — Assessment & Plan Note (Signed)
Entyvio  

## 2018-02-24 NOTE — Assessment & Plan Note (Signed)
On B12 inj 

## 2018-02-24 NOTE — Assessment & Plan Note (Signed)
lotensin HCT

## 2018-02-24 NOTE — Progress Notes (Signed)
Subjective:  Patient ID: [name] [name], female    DOB: May 23, 1970  Age: 48 y.o. MRN: [id number]  CC: No chief complaint on file.   HPI MANASA SPEASE presents for chronic pain, colitis, anxiety f/u C/o a stye on the right eye   Outpatient Medications Prior to Visit  Medication Sig Dispense Refill  . ALPRAZolam (XANAX) 0.5 MG tablet Take 1 tablet (0.5 mg total) by mouth at bedtime as needed. (Patient taking differently: Take 0.5 mg by mouth 2 (two) times daily. ) 30 tablet 2  . benazepril-hydrochlorthiazide (LOTENSIN HCT) 20-12.5 MG tablet TAKE 1 TABLET BY MOUTH DAILY. KEEP SCHEDULE APPT IN DEC FOR FUTURE REFILLS 90 tablet 3  . ciprofloxacin (CIPRO) 500 MG tablet Take 1 tablet (500 mg total) by mouth 2 (two) times daily. 20 tablet 1  . cyanocobalamin (,VITAMIN B-12,) 1000 MCG/ML injection Inject 1 mL (1,000 mcg total) into the skin every 14 (fourteen) days. 30 mL 2  . cyclobenzaprine (FLEXERIL) 5 MG tablet TAKE 1 TABLET (5 MG TOTAL) BY MOUTH AT BEDTIME AS NEEDED FOR MUSCLE SPASMS. 30 tablet 0  . esomeprazole (NEXIUM) 40 MG capsule 1 PO 30 MINS PRIOR TO FIRST MEAL 30 capsule 11  . HYDROcodone-acetaminophen (NORCO) 10-325 MG tablet Take 1 tablet by mouth every 8 (eight) hours as needed for severe pain. Please fill on or after 02/07/18 90 tablet 0  . ketorolac (TORADOL) 10 MG tablet Take 1 tablet (10 mg total) by mouth every 6 (six) hours as needed. for pain 20 tablet 0  . lamoTRIgine (LAMICTAL) 200 MG tablet Take 1 tablet by mouth daily.    . meperidine (DEMEROL) 50 MG tablet Take 1 tablet (50 mg total) by mouth daily as needed. For sever pain 20 tablet 0  . norethindrone (AYGESTIN) 5 MG tablet Take 1 tablet (5 mg total) by mouth daily. 90 tablet 3  . promethazine (PHENERGAN) 25 MG tablet Take 1 tablet (25 mg total) by mouth 2 (two) times daily as needed for nausea. 60 tablet 0  . sertraline (ZOLOFT) 100 MG tablet Take 100 mg by mouth 2 (two) times daily.      . sodium chloride 0.9 % SOLN  250 mL with vedolizumab 300 MG SOLR 300 mg Inject 300 mg into the vein. LOADING DOSE THEN Q8 WEEKS    . vedolizumab (ENTYVIO) 300 MG injection Inject into the vein. Every 8 weeks    . doxycycline (VIBRAMYCIN) 100 MG capsule Take 1 capsule (100 mg total) by mouth 2 (two) times daily. 14 capsule 0  . metroNIDAZOLE (METROGEL) 0.75 % vaginal gel Place 1 Applicatorful vaginally at bedtime. Apply one applicatorful to vagina at bedtime for 5 days 70 g 1   No facility-administered medications prior to visit.     ROS: Review of Systems  Constitutional: Negative for activity change, appetite change, chills, fatigue and unexpected weight change.  HENT: Negative for congestion, mouth sores and sinus pressure.   Eyes: Negative for visual disturbance.  Respiratory: Negative for cough and chest tightness.   Gastrointestinal: Negative for abdominal pain and nausea.  Genitourinary: Negative for difficulty urinating, frequency and vaginal pain.  Musculoskeletal: Positive for arthralgias and back pain. Negative for gait problem.  Skin: Negative for pallor and rash.  Neurological: Negative for dizziness, tremors, weakness, numbness and headaches.  Psychiatric/Behavioral: Negative for confusion, sleep disturbance and suicidal ideas.    Objective:  BP 126/72 (BP Location: Left Arm, Patient Position: Sitting, Cuff Size: Normal)   Pulse (!) 111  Temp 98 F (36.7 C) (Oral)   Ht 5\' 2"  (1.575 m)   Wt 163 lb (73.9 kg)   SpO2 98%   BMI 29.81 kg/m   BP Readings from Last 3 Encounters:  02/24/18 126/72  02/05/18 106/68  01/09/18 135/82    Wt Readings from Last 3 Encounters:  02/24/18 163 lb (73.9 kg)  02/05/18 160 lb 15 oz (73 kg)  01/09/18 161 lb (73 kg)    Physical Exam Constitutional:      General: She is not in acute distress.    Appearance: She is well-developed.  HENT:     Head: Normocephalic.     Right Ear: External ear normal.     Left Ear: External ear normal.     Nose: Nose normal.    Eyes:     General:        Right eye: No discharge.        Left eye: No discharge.     Conjunctiva/sclera: Conjunctivae normal.     Pupils: Pupils are equal, round, and reactive to light.  Neck:     Musculoskeletal: Normal range of motion and neck supple.     Thyroid: No thyromegaly.     Vascular: No JVD.     Trachea: No tracheal deviation.  Cardiovascular:     Rate and Rhythm: Normal rate and regular rhythm.     Heart sounds: Normal heart sounds.  Pulmonary:     Effort: No respiratory distress.     Breath sounds: No stridor. No wheezing.  Abdominal:     General: Bowel sounds are normal. There is no distension.     Palpations: Abdomen is soft. There is no mass.     Tenderness: There is no abdominal tenderness. There is no guarding or rebound.  Musculoskeletal:        General: No tenderness.  Lymphadenopathy:     Cervical: No cervical adenopathy.  Skin:    Findings: No erythema or rash.  Neurological:     Cranial Nerves: No cranial nerve deficit.     Motor: No abnormal muscle tone.     Coordination: Coordination normal.     Deep Tendon Reflexes: Reflexes normal.  Psychiatric:        Behavior: Behavior normal.        Thought Content: Thought content normal.        Judgment: Judgment normal.    Stye R eye  Lab Results  Component Value Date   WBC 9.0 11/25/2017   HGB 14.7 11/25/2017   HCT 41.1 11/25/2017   PLT 134.0 (L) 11/25/2017   GLUCOSE 94 11/25/2017   CHOL 168 08/30/2014   TRIG 99.0 08/30/2014   HDL 27.40 (L) 08/30/2014   LDLDIRECT 171.9 05/01/2011   LDLCALC 121 (H) 08/30/2014   ALT 10 11/25/2017   AST 12 11/25/2017   NA 138 11/25/2017   K 3.9 11/25/2017   CL 103 11/25/2017   CREATININE 1.36 (H) 11/25/2017   BUN 17 11/25/2017   CO2 26 11/25/2017   TSH 1.82 05/28/2016    No results found.  Assessment & Plan:   There are no diagnoses linked to this encounter.   No orders of the defined types were placed in this encounter.    Follow-up: No  follow-ups on file.  [name], MD

## 2018-02-24 NOTE — Assessment & Plan Note (Signed)
Wt Readings from Last 3 Encounters:  02/24/18 163 lb (73.9 kg)  02/05/18 160 lb 15 oz (73 kg)  01/09/18 161 lb (73 kg)

## 2018-03-06 ENCOUNTER — Telehealth: Payer: Self-pay | Admitting: Gastroenterology

## 2018-03-06 NOTE — Telephone Encounter (Signed)
Patient called and said her nexium needed to be refilled but she was told it needed a prior [place].  She said her pharmacy should be sending [place] something here about that

## 2018-03-07 NOTE — Telephone Encounter (Signed)
Received note today that she was given temporary supply and I will start to work on [place].

## 2018-03-24 DIAGNOSIS — F3181 Bipolar II disorder: Secondary | ICD-10-CM | POA: Diagnosis not present

## 2018-04-02 ENCOUNTER — Encounter (HOSPITAL_COMMUNITY)
Admission: RE | Admit: 2018-04-02 | Discharge: 2018-04-02 | Disposition: A | Discharge status: Home / Self Care | Payer: 59 | Source: Ambulatory Visit | Attending: Gastroenterology | Admitting: Gastroenterology

## 2018-04-02 ENCOUNTER — Encounter (HOSPITAL_COMMUNITY): Payer: Self-pay

## 2018-04-02 DIAGNOSIS — K5090 Crohn's disease, unspecified, without complications: Secondary | ICD-10-CM | POA: Insufficient documentation

## 2018-04-02 MED ORDER — METHYLPREDNISOLONE SODIUM SUCC 40 MG IJ SOLR
40.0000 mg | Freq: Once | INTRAMUSCULAR | Status: AC
  Administered 2018-04-02: 40 mg via INTRAVENOUS
  Filled 2018-04-02: qty 1

## 2018-04-02 MED ORDER — SODIUM CHLORIDE 0.9 % IV SOLN
Freq: Once | INTRAVENOUS | Status: AC
  Administered 2018-04-02: 08:00:00 via INTRAVENOUS

## 2018-04-02 MED ORDER — VEDOLIZUMAB 300 MG IV SOLR
300.0000 mg | Freq: Once | INTRAVENOUS | Status: AC
  Administered 2018-04-02: 300 mg via INTRAVENOUS
  Filled 2018-04-02: qty 5

## 2018-04-04 ENCOUNTER — Telehealth: Payer: Self-pay | Admitting: Gastroenterology

## 2018-04-04 NOTE — Telephone Encounter (Signed)
[phone] Insurance will not pay for generic nexium but will pay for generic protonix, can we call that in for her instead..please advise  [facility] [place] [facility] [address]

## 2018-04-04 NOTE — Telephone Encounter (Signed)
Spoke with pt, she would like to change to the Pantoprazole since her insurance will cover it. She would also like a 90 day supply. Pt is aware that she needs her providers approval to change medication.

## 2018-04-06 ENCOUNTER — Other Ambulatory Visit: Payer: Self-pay | Admitting: Internal Medicine

## 2018-04-09 MED ORDER — PANTOPRAZOLE SODIUM 40 MG PO TBEC: DELAYED_RELEASE_TABLET | ORAL | 3 refills | Status: DC

## 2018-04-09 NOTE — Telephone Encounter (Signed)
PLEASE CALL PT.  Rx sent.  

## 2018-04-09 NOTE — Addendum Note (Signed)
Addended by: Danie Binder on: 04/09/2018 11:53 AM   Modules accepted: Orders

## 2018-04-09 NOTE — Telephone Encounter (Signed)
Phone not working. Mailed a letter that the Rx has been sent in.

## 2018-04-21 ENCOUNTER — Other Ambulatory Visit: Payer: Self-pay | Admitting: Internal Medicine

## 2018-05-02 ENCOUNTER — Telehealth: Payer: Self-pay | Admitting: Internal Medicine

## 2018-05-02 ENCOUNTER — Other Ambulatory Visit: Payer: Self-pay | Admitting: Obstetrics and Gynecology

## 2018-05-02 DIAGNOSIS — K50111 Crohn's disease of large intestine with rectal bleeding: Secondary | ICD-10-CM

## 2018-05-02 NOTE — Telephone Encounter (Signed)
Copied from [place] [id number]. Topic: General - Other >> May 02, 2018  9:11 AM Antonieta Iba C wrote: Reason for CRM: pt called in to follow up on my-chart message that was sent to PCP the other day. Pt says that she have a cyst down below. Pt says that provider is usually aware of her situation and why she need medication.   [phone] - CB:    [id number] [phone](Phone) [phone](Fax)   Please assist.

## 2018-05-02 NOTE — Telephone Encounter (Signed)
mychart message was sent to PCP

## 2018-05-04 ENCOUNTER — Other Ambulatory Visit: Payer: Self-pay | Admitting: Internal Medicine

## 2018-05-04 DIAGNOSIS — K50111 Crohn's disease of large intestine with rectal bleeding: Secondary | ICD-10-CM

## 2018-05-04 MED ORDER — CIPROFLOXACIN HCL 500 MG PO TABS: 500.0000 mg | ORAL_TABLET | Freq: Two times a day (BID) | ORAL | 1 refills | Status: DC

## 2018-05-09 ENCOUNTER — Other Ambulatory Visit: Payer: Self-pay

## 2018-05-09 ENCOUNTER — Encounter: Payer: Self-pay | Admitting: Adult Health

## 2018-05-09 ENCOUNTER — Ambulatory Visit (INDEPENDENT_AMBULATORY_CARE_PROVIDER_SITE_OTHER): Payer: 59 | Admitting: Adult Health

## 2018-05-09 VITALS — BP 130/72 | HR 117 | Ht 62.0 in | Wt 157.0 lb

## 2018-05-09 DIAGNOSIS — K5010 Crohn's disease of large intestine without complications: Secondary | ICD-10-CM | POA: Diagnosis not present

## 2018-05-09 DIAGNOSIS — N823 Fistula of vagina to large intestine: Secondary | ICD-10-CM | POA: Diagnosis not present

## 2018-05-09 MED ORDER — CIPROFLOXACIN HCL 500 MG PO TABS: 500.0000 mg | ORAL_TABLET | Freq: Two times a day (BID) | ORAL | 1 refills | Status: DC

## 2018-05-09 MED ORDER — ONDANSETRON HCL 4 MG PO TABS: 4.0000 mg | ORAL_TABLET | Freq: Three times a day (TID) | ORAL | 0 refills | Status: DC | PRN

## 2018-05-09 MED ORDER — METRONIDAZOLE 500 MG PO TABS: 500.0000 mg | ORAL_TABLET | Freq: Two times a day (BID) | ORAL | 1 refills | Status: DC

## 2018-05-09 NOTE — Progress Notes (Signed)
Patient ID: [name] [name], female   DOB: 06/01/1970, 48 y.o.   MRN: [id number] History of Present Illness: [name] [name] is a 48 year old white female, married with history of Crohn's and RV fistula in complaining of it draining and collection of fluid left buttock. She sees Dr Oneida Alar and is on entyvio. She had fever,headache and diarrhea last week and had some Cipro and phenergan had home and took that. She has taken episom salt baths and gets husband to massage area and clean with wipes.  PCP is Dr Alain Marion.   Current Medications, Allergies, Past Medical History, Past Surgical History, Family History and Social History were reviewed in [employer] record.     Review of Systems:  Has draining RV fistula, with collection of fluid left buttock Has 2 areas on skin Had nausea with headache, fever and diarrhea last week   Physical Exam: BP 130/72 (BP Location: Right Arm, Patient Position: Sitting, Cuff Size: Normal)   Pulse (!) 117   Ht 5\' 2"  (1.575 m)   Wt 157 lb (71.2 kg)   BMI 28.72 kg/m  General:  Well developed, well nourished, no acute distress Skin:  Warm and dry, has area left under arm like beginning boil and healing area right rib area.  Pelvic:  External genitalia is normal in appearance.  The vagina has creamy discharge.+RV fistula.  Urethra has no lesions or masses. The cervix is smooth.  Uterus is felt to be normal size, shape, and contour.  No adnexal masses or tenderness noted.Bladder is non tender, no masses felt. Has area on left buttock that is tender and slightly swollen draining creamy fluid, culture obtained, Dr Elonda Husky in for co exam. Rectal: external hemorrhoid Psych:  No mood changes, alert and cooperative,seems happy Fall risk is low. Will rx Cipro and flagyl bid x 14 days.Will also give zofran so won;t be so sleepy, if needs something for nausea.   Impression: 1. RVF (rectovaginal fistula)   2. Crohn's disease of anal canal ([place])        Plan: Meds ordered this encounter  Medications  . ciprofloxacin (CIPRO) 500 MG tablet    Sig: Take 1 tablet (500 mg total) by mouth 2 (two) times daily.    Dispense:  28 tablet    Refill:  1    Order Specific Question:   Supervising Provider    Answer:   Elonda Husky, LUTHER H [[id number]]  . metroNIDAZOLE (FLAGYL) 500 MG tablet    Sig: Take 1 tablet (500 mg total) by mouth 2 (two) times daily.    Dispense:  28 tablet    Refill:  1    Order Specific Question:   Supervising Provider    Answer:   Elonda Husky, LUTHER H [[id number]]  . ondansetron (ZOFRAN) 4 MG tablet    Sig: Take 1 tablet (4 mg total) by mouth every 8 (eight) hours as needed for nausea or vomiting.    Dispense:  20 tablet    Refill:  0    Order Specific Question:   Supervising Provider    Answer:   Florian Buff [[id number]]  Culture sent F/U with Dr Oneida Alar if not getting better F/U here prn

## 2018-05-12 ENCOUNTER — Telehealth: Payer: Self-pay | Admitting: Gastroenterology

## 2018-05-12 NOTE — Telephone Encounter (Signed)
PT said that she wasn't aware we were not seeing pt's before she called. She has a lengthy message and she will send it in [place] in the next hour or so.

## 2018-05-12 NOTE — Telephone Encounter (Signed)
Pt called wanting to be seen this week. I told her that the nurse would have to call her and she is aware of her OV in June. [phone]

## 2018-05-13 NOTE — Telephone Encounter (Signed)
I called pt to let her know we did not receive a message in [place]. She said everything is OK. She said everything passed over and she has not problems at this time.

## 2018-05-13 NOTE — Telephone Encounter (Signed)
REVIEWED-NO ADDITIONAL RECOMMENDATIONS. 

## 2018-05-14 ENCOUNTER — Telehealth: Payer: Self-pay | Admitting: Adult Health

## 2018-05-14 LAB — WOUND CULTURE

## 2018-05-14 MED ORDER — CLINDAMYCIN HCL 300 MG PO CAPS: 300.0000 mg | ORAL_CAPSULE | Freq: Three times a day (TID) | ORAL | 0 refills | Status: DC

## 2018-05-14 NOTE — Telephone Encounter (Signed)
Pt aware culture +staph. Aureus, resistant to Cipro,so stop Cipro, will change to clindamycin and continue flagyl.. She is feeling better

## 2018-05-17 ENCOUNTER — Other Ambulatory Visit: Payer: Self-pay | Admitting: Internal Medicine

## 2018-05-26 ENCOUNTER — Ambulatory Visit (INDEPENDENT_AMBULATORY_CARE_PROVIDER_SITE_OTHER): Payer: 59 | Admitting: Internal Medicine

## 2018-05-26 ENCOUNTER — Encounter: Payer: Self-pay | Admitting: Internal Medicine

## 2018-05-26 DIAGNOSIS — N824 Other female intestinal-genital tract fistulae: Secondary | ICD-10-CM

## 2018-05-26 DIAGNOSIS — G8929 Other chronic pain: Secondary | ICD-10-CM

## 2018-05-26 DIAGNOSIS — E538 Deficiency of other specified B group vitamins: Secondary | ICD-10-CM | POA: Diagnosis not present

## 2018-05-26 DIAGNOSIS — K50013 Crohn's disease of small intestine with fistula: Secondary | ICD-10-CM | POA: Diagnosis not present

## 2018-05-26 DIAGNOSIS — M5440 Lumbago with sciatica, unspecified side: Secondary | ICD-10-CM

## 2018-05-26 DIAGNOSIS — I10 Essential (primary) hypertension: Secondary | ICD-10-CM

## 2018-05-26 MED ORDER — PROMETHAZINE HCL 25 MG PO TABS: 25.0000 mg | ORAL_TABLET | Freq: Two times a day (BID) | ORAL | 0 refills | Status: DC | PRN

## 2018-05-26 MED ORDER — HYDROCODONE-ACETAMINOPHEN 10-325 MG PO TABS: 1.0000 | ORAL_TABLET | Freq: Three times a day (TID) | ORAL | 0 refills | Status: DC | PRN

## 2018-05-26 MED ORDER — KETOROLAC TROMETHAMINE 10 MG PO TABS: 10.0000 mg | ORAL_TABLET | Freq: Four times a day (QID) | ORAL | 0 refills | Status: DC | PRN

## 2018-05-26 NOTE — Assessment & Plan Note (Addendum)
Renewal of Norco, Toradol  Potential benefits of a long term opioids use as well as potential risks (i.e. addiction risk, apnea etc) and complications (i.e. Somnolence, constipation and others) were explained to the patient and were aknowledged.

## 2018-05-26 NOTE — Assessment & Plan Note (Signed)
Family has been unable to see Dr. Oneida Alar due to coronavirus measures.  She is status post GYN evaluation and treatment for fistula

## 2018-05-26 NOTE — Assessment & Plan Note (Signed)
Continue with Lotensin HCT

## 2018-05-26 NOTE — Progress Notes (Signed)
Virtual Visit via Telephone Note  I connected with [name] [name] on 05/26/18 at  7:50 AM EDT by telephone and verified that I am speaking with the correct person using two identifiers.   I discussed the limitations, risks, security and privacy concerns of performing an evaluation and management service by telephone and the availability of in person appointments. I also discussed with the patient that there may be a patient responsible charge related to this service. The patient expressed understanding and agreed to proceed.   History of Present Illness: [name] [name] was sick with a virus.  She thinks she was infected by her husband who went on a trip to [place].  She had flulike symptoms in the beginning of March.  Later her vaginal and anal fistula was opened up.  She saw her gynecologist on March 22 for swollen anus and labia.  She was put on Cipro and Flagyl.  Later Flagyl was switched to clindamycin on 3/26.  She is better.  She needs her promethazine and pain meds renewed. Follow-up colitis, vaginal rectal fistulas, chronic pain.   Observations/Objective:  [name] [name] looks well, she is in no acute distress Assessment and Plan:  See plan Follow Up Instructions:    I discussed the assessment and treatment plan with the patient. The patient was provided an opportunity to ask questions and all were answered. The patient agreed with the plan and demonstrated an understanding of the instructions.   The patient was advised to call back or seek an in-person evaluation if the symptoms worsen or if the condition fails to improve as anticipated.  I provided 20 minutes of non-face-to-face time during this encounter.   [name], MD

## 2018-05-26 NOTE — Assessment & Plan Note (Signed)
She was on Cipro and Flagyl in March.  Currently: Clindamycin per her GYN Obtain lab work

## 2018-05-26 NOTE — Assessment & Plan Note (Signed)
On vitamin B12

## 2018-05-28 ENCOUNTER — Other Ambulatory Visit: Payer: Self-pay | Admitting: Internal Medicine

## 2018-05-28 ENCOUNTER — Encounter (HOSPITAL_COMMUNITY): Payer: 59

## 2018-05-28 ENCOUNTER — Other Ambulatory Visit (HOSPITAL_COMMUNITY)
Admission: RE | Admit: 2018-05-28 | Discharge: 2018-05-28 | Disposition: A | Discharge status: Home / Self Care | Payer: 59 | Source: Ambulatory Visit | Attending: Internal Medicine | Admitting: Internal Medicine

## 2018-05-28 DIAGNOSIS — N824 Other female intestinal-genital tract fistulae: Secondary | ICD-10-CM | POA: Insufficient documentation

## 2018-05-28 DIAGNOSIS — E538 Deficiency of other specified B group vitamins: Secondary | ICD-10-CM | POA: Insufficient documentation

## 2018-05-28 DIAGNOSIS — K50013 Crohn's disease of small intestine with fistula: Secondary | ICD-10-CM | POA: Insufficient documentation

## 2018-05-28 LAB — BASIC METABOLIC PANEL
Anion gap: 11 (ref 5–15)
BUN: 21 mg/dL — ABNORMAL HIGH (ref 6–20)
CO2: 22 mmol/L (ref 22–32)
Calcium: 9.4 mg/dL (ref 8.9–10.3)
Chloride: 106 mmol/L (ref 98–111)
Creatinine, Ser: 1.52 mg/dL — ABNORMAL HIGH (ref 0.44–1.00)
GFR calc Af Amer: 47 mL/min — ABNORMAL LOW (ref >60)
GFR calc non Af Amer: 40 mL/min — ABNORMAL LOW (ref >60)
Glucose, Bld: 110 mg/dL — ABNORMAL HIGH (ref 70–99)
Potassium: 3.3 mmol/L — ABNORMAL LOW (ref 3.5–5.1)
Sodium: 139 mmol/L (ref 135–145)

## 2018-05-28 LAB — CBC WITH DIFFERENTIAL/PLATELET
Abs Immature Granulocytes: 0.04 10*3/uL (ref 0.00–0.07)
Basophils Absolute: 0 10*3/uL (ref 0.0–0.1)
Basophils Relative: 0 %
Eosinophils Absolute: 0 10*3/uL (ref 0.0–0.5)
Eosinophils Relative: 0 %
HCT: 39 % (ref 36.0–46.0)
Hemoglobin: 13.1 g/dL (ref 12.0–15.0)
Immature Granulocytes: 1 %
Lymphocytes Relative: 21 %
Lymphs Abs: 1.8 10*3/uL (ref 0.7–4.0)
MCH: 30.6 pg (ref 26.0–34.0)
MCHC: 33.6 g/dL (ref 30.0–36.0)
MCV: 91.1 fL (ref 80.0–100.0)
Monocytes Absolute: 0.4 10*3/uL (ref 0.1–1.0)
Monocytes Relative: 5 %
Neutro Abs: 6 10*3/uL (ref 1.7–7.7)
Neutrophils Relative %: 73 %
Platelets: 181 10*3/uL (ref 150–400)
RBC: 4.28 MIL/uL (ref 3.87–5.11)
RDW: 13.3 % (ref 11.5–15.5)
WBC: 8.3 10*3/uL (ref 4.0–10.5)
nRBC: 0 % (ref 0.0–0.2)

## 2018-05-28 LAB — VITAMIN B12: Vitamin B-12: 268 pg/mL (ref 180–914)

## 2018-05-28 LAB — TSH: TSH: 0.585 u[IU]/mL (ref 0.350–4.500)

## 2018-05-28 LAB — HEPATIC FUNCTION PANEL
ALT: 13 U/L (ref 0–44)
AST: 13 U/L — ABNORMAL LOW (ref 15–41)
Albumin: 4.3 g/dL (ref 3.5–5.0)
Alkaline Phosphatase: 56 U/L (ref 38–126)
Bilirubin, Direct: <0.1 mg/dL (ref 0.0–0.2)
Total Bilirubin: 0.5 mg/dL (ref 0.3–1.2)
Total Protein: 7.2 g/dL (ref 6.5–8.1)

## 2018-05-28 MED ORDER — POTASSIUM CHLORIDE ER 10 MEQ PO TBCR: 10.0000 meq | EXTENDED_RELEASE_TABLET | Freq: Every day | ORAL | 3 refills | Status: DC

## 2018-06-02 ENCOUNTER — Encounter (HOSPITAL_COMMUNITY)
Admission: RE | Admit: 2018-06-02 | Discharge: 2018-06-02 | Disposition: A | Discharge status: Home / Self Care | Payer: 59 | Source: Ambulatory Visit | Attending: Gastroenterology | Admitting: Gastroenterology

## 2018-06-02 DIAGNOSIS — K5090 Crohn's disease, unspecified, without complications: Secondary | ICD-10-CM | POA: Insufficient documentation

## 2018-06-04 ENCOUNTER — Ambulatory Visit (INDEPENDENT_AMBULATORY_CARE_PROVIDER_SITE_OTHER): Payer: 59 | Admitting: Nurse Practitioner

## 2018-06-04 ENCOUNTER — Other Ambulatory Visit: Payer: Self-pay

## 2018-06-04 ENCOUNTER — Encounter: Payer: Self-pay | Admitting: Nurse Practitioner

## 2018-06-04 DIAGNOSIS — K5900 Constipation, unspecified: Secondary | ICD-10-CM | POA: Insufficient documentation

## 2018-06-04 DIAGNOSIS — K50013 Crohn's disease of small intestine with fistula: Secondary | ICD-10-CM | POA: Diagnosis not present

## 2018-06-04 DIAGNOSIS — N824 Other female intestinal-genital tract fistulae: Secondary | ICD-10-CM | POA: Diagnosis not present

## 2018-06-04 DIAGNOSIS — K611 Rectal abscess: Secondary | ICD-10-CM | POA: Diagnosis not present

## 2018-06-04 NOTE — Progress Notes (Addendum)
REVIEWED-DISCUSSED WITH RADIOLOGY: PATEL.  WOULD PERFORM CT PELVIS W/ IV AND PO CONTRAST TO ASSESS PERINEUM PRIOR TO RESUMING ENTYVIO. PT HAS DECLINED SURGICAL INTERVENTION IN THE PAST. DISCUSS WITH HER AND DOCUMENT HER ANSWER.  Referring Provider: Cassandria Anger, MD Primary Care Physician:  Cassandria Anger, MD Primary GI:  Dr. Oneida Alar  NOTE: Service was provided via telemedicine and was requested by the patient due to COVID-19 pandemic.  Method of visit: Zoom  Patient Location: Home  Provider Location: Office  Reason for Phone Visit: Concerns about abscess/fistula; Crohn's disease  The patient was consented to phone follow-up via telephone encounter including billing of the encounter (yes/no): Yes  Persons present on the phone encounter, with roles: None  Total time (minutes) spent on medical discussion: 26 minutes  Chief Complaint  Patient presents with  . Crohn's Disease    HPI:   [name] [name] is a 48 y.o. female who presents for virtual visit regarding: Crohn's disease and concerns of a possible abscess/fistula.  The patient was last seen by our office 01/02/2018 for Crohn's disease, GERD, rectovaginal fistula.  At that time bowel movements are good 1-2 times a day consistently Sa[place] 4.  Joint pains about every 3 days, not associated with Entyvio.  Noted occasional drainage out of her vagina but improving, although not completely resolved.  Heartburn well controlled.  No other GI symptoms.  Still smoking less than a pack a day.  Flu and pneumonia shot were noted to be up-to-date.  Recommended avoid triggers that cause GERD and bloating, continue efforts to quit smoking, continue Entyvio, consider repeat colonoscopy in 2020, continue Nexium.  Follow-up in 6 months.  The patient's insurance would not cover Nexium.  She was switched to pantoprazole.  The patient sent a MyChart message yesterday requesting a virtual visit.  She had recently had an abscess and  attached to pictures.    The first picture was a written narrative indicating a noted abscess on her left buttock area.  She took Cipro which helped as well as trying to manually express.  She indicates she had to insert an index finger with a baby wipe and pushed from the outside inward.  Also tried Epson salt soaks.  Made an appoint with OB/GYN who added Flagyl to her Cipro and swabbed the abscess.  On March 26 her OB/GYN added clindamycin.  She is finished all medication but is continuing on Cipro.  Also missed her Entyvio appointment on 06/19/22 due to a death in the family.    The second picture was a photograph of what appears to be a blurred image of her perineal area with a possible pinpoint draining abscess area that does not look overtly inflamed, although the image is poor quality.  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  Reviewed OB/GYN note dated 05/09/2018 which noted a draining rectovaginal fistula with left buttock fluid collection.  Culture was sent and found to be staph aureus resistant to Cipro, moxifloxacin, penicillin.  There was sensitivity to clindamycin and this was sent to her pharmacy by OB/GYN.  Today she states she's doing ok overall. Hasn't rescheduled Entyvio yet, but will call. She is still taking Cipro which was given by PCP. She feels this helps when she has issues with her fistula. She has been having intermittent fistula issues for 15 years. Has seen a surgeon at [facility] which offered her a colostomy which she's declined.  Denies abdominal pain, N/V. Stools are good since on Entyvio; no loose stools or hematochezia. However, she feels like she's recently been having incomplete emptying. Took a Dulcolax which helped. Drinks plenty of water and fiber foods. Has never use MiraLAX. Denies melena, fever, chills, unintentional weight loss. Appetite is normal, energy level is low because B12 level is  low and her Rx was just increased. Denies URI or flu-like symptoms. Denies chest pain, dyspnea, dizziness, lightheadedness, syncope, near syncope. Denies any other upper or lower GI symptoms.  States after finishing antibiotics, feels like the "pinhole" fistula has closed up.  Past Medical History:  Diagnosis Date  . Anxiety   . Arthritis    Dr. Eddie Dibbles  . Bartholin cyst 2008   vag.  [name] BIPOLAR AFFECTIVE DISORDER 04/14/2007  . Crohn's ON ENTYVIO SINCE FEB [id number]   COMPLICATED BY RECTOVAGINAL FISTULA. SJS WITH REMICADE. FAILED HUMIRA.  [name] Depression   . Elevated glucose 2010  . GERD (gastroesophageal reflux disease)   . Hypertension   . Kidney stone   . LBP (low back pain)    Dr. Mina Marble  . Perianal abscess 2009  . Vitamin B12 deficiency     Past Surgical History:  Procedure Laterality Date  . COLONOSCOPY  09/2008   Dr. Derrill Kay: ulcers, edema, possible fistula openings all seen in the distal 3 cm of anus/anal canal. 1cm pseudopolyp. rest of colon and TI normal. rectal biopsy with mild chronic active colitis. ileum bx normal.  . COLONOSCOPY WITH PROPOFOL N/A 03/29/2015   SLF: Severe proctocolitis limitied to cecum and rectum. Limited exam of the colon mucosa and anal canal.   . ELBOW SURGERY     left  . ESOPHAGOGASTRODUODENOSCOPY (EGD) WITH PROPOFOL N/A 03/29/2015   SLF: 1. Erosive gastritis duodentitis.   [name] RECTOVAGINAL FISTULA CLOSURE     did not help    Current Outpatient Medications  Medication Sig Dispense Refill  . ALPRAZolam (XANAX) 0.5 MG tablet Take 1 tablet (0.5 mg total) by mouth at bedtime as needed. (Patient taking differently: Take 0.5 mg by mouth 2 (two) times daily. ) 30 tablet 2  . benazepril-hydrochlorthiazide (LOTENSIN HCT) 20-12.5 MG tablet Take 1 tablet by mouth daily. 90 tablet 3  . cyanocobalamin (,VITAMIN B-12,) 1000 MCG/ML injection INJECT 1ML INTO THE SKIN EVERY 14 DAYS (Patient taking differently: Every 10 days) 6 mL 11  . cyclobenzaprine (FLEXERIL) 10 MG tablet  Take 1 tablet (10 mg total) by mouth 3 (three) times daily as needed for muscle spasms. 30 tablet 1  . HYDROcodone-acetaminophen (NORCO) 10-325 MG tablet Take 1 tablet by mouth every 8 (eight) hours as needed for severe pain. Please fill on or after 06/09/18 90 tablet 0  . lamoTRIgine (LAMICTAL) 200 MG tablet Take 1 tablet by mouth daily.    . norethindrone (AYGESTIN) 5 MG tablet Take 1 tablet (5 mg total) by mouth daily. 90 tablet 3  . pantoprazole (PROTONIX) 40 MG tablet 1 PO 30 MINUTES PRIOR TO YOUR FIRST MEAL QD 90 tablet 3  . potassium chloride (K-DUR) 10 MEQ tablet Take 1 tablet (10 mEq total) by mouth daily. 30 tablet 3  . promethazine (PHENERGAN) 25 MG tablet Take 1 tablet (25 mg total) by mouth 2 (two) times daily as needed for nausea. 60 tablet 0  . sertraline (ZOLOFT) 100 MG tablet Take 100 mg by mouth 2 (two) times daily.      . sodium chloride 0.9 % SOLN 250 mL with vedolizumab 300 MG SOLR 300 mg  Inject 300 mg into the vein. LOADING DOSE THEN Q8 WEEKS    . vedolizumab (ENTYVIO) 300 MG injection Inject into the vein. Every 8 weeks    . ciprofloxacin (CIPRO) 500 MG tablet Take 1 tablet (500 mg total) by mouth 2 (two) times daily. (Patient not taking: Reported on 06/04/2018) 28 tablet 1  . clindamycin (CLEOCIN) 300 MG capsule Take 1 capsule (300 mg total) by mouth 3 (three) times daily. (Patient not taking: Reported on 06/04/2018) 30 capsule 0  . ketorolac (TORADOL) 10 MG tablet Take 1 tablet (10 mg total) by mouth every 6 (six) hours as needed. for pain (Patient not taking: Reported on 06/04/2018) 20 tablet 0  . metroNIDAZOLE (FLAGYL) 500 MG tablet Take 1 tablet (500 mg total) by mouth 2 (two) times daily. (Patient not taking: Reported on 06/04/2018) 28 tablet 1  . ondansetron (ZOFRAN) 4 MG tablet Take 1 tablet (4 mg total) by mouth every 8 (eight) hours as needed for nausea or vomiting. (Patient not taking: Reported on 06/04/2018) 20 tablet 0   No current facility-administered medications for  this visit.     Allergies as of 06/04/2018 - Review Complete 06/04/2018  Allergen Reaction Noted  . Cephalexin    . Codeine    . Guaifenesin    . Imitrex [sumatriptan]  10/17/2011  . Morphine    . Remicade [infliximab]  05/26/2015  . Sulfonamide derivatives    . Ultram [tramadol hcl]  05/26/2015    Family History  Problem Relation Age of Onset  . Diabetes Mother   . Heart disease Mother   . Heart attack Mother   . Cancer Maternal Aunt        ovarian  . Breast cancer Maternal Grandmother   . Early death Maternal Grandmother 20  . Cancer Maternal Grandmother        breast  . Heart attack Father   . Heart disease Father   . Cancer Maternal Grandfather        colon    Social History   Socioeconomic History  . Marital status: Married    Spouse name: Not on file  . Number of children: Not on file  . Years of education: Not on file  . Highest education level: Not on file  Occupational History  . Not on file  Social Needs  . Financial resource strain: Not on file  . Food insecurity:    Worry: Not on file    Inability: Not on file  . Transportation needs:    Medical: Not on file    Non-medical: Not on file  Tobacco Use  . Smoking status: Current Every Day Smoker    Packs/day: 0.50    Years: 25.00    Pack years: 12.50    Types: Cigarettes  . Smokeless tobacco: Never Used  Substance and Sexual Activity  . Alcohol use: No  . Drug use: No  . Sexual activity: Yes    Partners: Male    Birth control/protection: Pill  Lifestyle  . Physical activity:    Days per week: Not on file    Minutes per session: Not on file  . Stress: Not on file  Relationships  . Social connections:    Talks on phone: Not on file    Gets together: Not on file    Attends religious service: Not on file    Active member of club or organization: Not on file    Attends meetings of clubs or organizations: Not on file  Relationship status: Not on file  Other Topics Concern  . Not on file   Social History Narrative   Regular Exercise-no    Review of Systems: Complete ROS negative except as per HPI.  Physical Exam: Note: limited exam due to virtual visit General:   Alert and oriented. Pleasant and cooperative. Well-nourished and well-developed.  Head:  Normocephalic and atraumatic. Eyes:  Without icterus, sclera clear and conjunctiva pink.  Ears:  Normal auditory acuity. Skin:  Intact without facial significant lesions or rashes. Neurologic:  Alert and oriented x4;  grossly normal neurologically. Psych:  Alert and cooperative. Normal mood and affect. Heme/Lymph/Immune: No excessive bruising noted.

## 2018-06-04 NOTE — Assessment & Plan Note (Signed)
Per the patient's description she might be having some mild constipation.  She does have a bowel movement daily, but notes some incomplete emptying.  I will have her trial MiraLAX 1-2 times a day, or half dose once a day if it is too much.  She can call us with any worsening symptoms.  Follow-up in 6 to 8 weeks.

## 2018-06-04 NOTE — Patient Instructions (Signed)
Your health issues we discussed today were:   Crohn's disease and fistula: 1. As we discussed, call your infusion center to reschedule your Entyvio dose 2. Continue your current regimen 3. I will discuss with Dr. Oneida Alar if there is any need for MRI or possible referral back to a surgeon 4. Further recommendations will follow 5. Call us for any severe or worsening symptoms  Mild constipation: 1. You can start taking MiraLAX powder.  You can do 1 dose daily. 2. You can increase this to 2 doses daily if 1 dose is not enough 3. If 1 dose is too much you can try half a dose daily or 1 dose every other day 4. Call us if you have any severe or worsening symptoms  Overall I recommend:  1. Continue your other medications 2. Call us if you have any questions or concerns 3. Return for follow-up in 6 to 8 weeks   Because of recent events of COVID-19 ("Coronavirus"), follow CDC recommendations:  1. Wash your hand frequently 2. Avoid touching your face 3. Stay away from people who are sick 4. If you have symptoms such as fever, cough, shortness of breath then call your healthcare provider for further guidance 5. If you are sick, STAY AT HOME unless otherwise directed by your healthcare provider. 6. Follow directions from state and national officials regarding staying safe    At [facility] we value your feedback. You may receive a survey about your visit today. Please share your experience as we strive to create trusting relationships with our patients to provide genuine, compassionate, quality care.  We appreciate your understanding and patience as we review any laboratory studies, imaging, and other diagnostic tests that are ordered as we care for you. Our office policy is 5 business days for review of these results, and any emergent or urgent results are addressed in a timely manner for your best interest. If you do not hear from our office in 1 week, please contact us.   We  also encourage the use of MyChart, which contains your medical information for your review as well. If you are not enrolled in this feature, an access code is on this after visit summary for your convenience. Thank you for allowing [place] to be involved in your care.  It was great to see you today!  I hope you have a great day!!

## 2018-06-04 NOTE — Assessment & Plan Note (Signed)
Crohn's disease seems to be doing well on Entyvio.  She had to miss her dose 2 days ago due to a death in the family.  I recommended highly that she call to reschedule her dosing to stay on track.  Her stools are doing well.  She feels like she is having some incomplete emptying and I will start her on MiraLAX as per below.  Follow-up in 6 to 8 weeks.  Further recommendations to follow.

## 2018-06-04 NOTE — Assessment & Plan Note (Signed)
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  Her Crohn's is generally been well controlled on Entyvio.  She has had a pinhole opening formation in her buttock area related to the fistula which appears, per the patient description, to have healed since being treated with antibiotics after culture by OB GYN.  Stools are doing well other than some sensation of incomplete emptying.  We will further address that below.  I will discuss with Dr. Oneida Alar about possible options including if there is any need for an MRI to further assess the fistula tract and/or surgical referral.  She previously saw surgeon in a number years ago who can only offer her a colostomy, per the patient.  There may be additional options at this time.  We will follow-up with her after consultation.

## 2018-06-05 NOTE — Progress Notes (Signed)
cc'ed to pcp °

## 2018-06-09 ENCOUNTER — Encounter (HOSPITAL_COMMUNITY)
Admission: RE | Admit: 2018-06-09 | Discharge: 2018-06-09 | Disposition: A | Discharge status: Home / Self Care | Payer: 59 | Source: Ambulatory Visit | Attending: Gastroenterology | Admitting: Gastroenterology

## 2018-06-09 ENCOUNTER — Other Ambulatory Visit: Payer: Self-pay

## 2018-06-09 ENCOUNTER — Encounter (HOSPITAL_COMMUNITY): Payer: Self-pay

## 2018-06-09 DIAGNOSIS — K5090 Crohn's disease, unspecified, without complications: Secondary | ICD-10-CM | POA: Diagnosis not present

## 2018-06-09 MED ORDER — VEDOLIZUMAB 300 MG IV SOLR
300.0000 mg | Freq: Once | INTRAVENOUS | Status: AC
  Administered 2018-06-09: 300 mg via INTRAVENOUS
  Filled 2018-06-09: qty 5

## 2018-06-09 MED ORDER — METHYLPREDNISOLONE SODIUM SUCC 40 MG IJ SOLR
40.0000 mg | Freq: Once | INTRAMUSCULAR | Status: AC
  Administered 2018-06-09: 40 mg via INTRAVENOUS
  Filled 2018-06-09: qty 1

## 2018-06-09 MED ORDER — SODIUM CHLORIDE 0.9 % IV SOLN
Freq: Once | INTRAVENOUS | Status: AC
  Administered 2018-06-09: 08:00:00 via INTRAVENOUS

## 2018-06-10 NOTE — Addendum Note (Signed)
Addended by: Danie Binder on: 06/10/2018 12:47 PM   Modules accepted: Orders

## 2018-06-27 ENCOUNTER — Other Ambulatory Visit: Payer: Self-pay

## 2018-06-27 MED ORDER — HYDROCODONE-ACETAMINOPHEN 10-325 MG PO TABS: 1.0000 | ORAL_TABLET | Freq: Three times a day (TID) | ORAL | 0 refills | Status: DC | PRN

## 2018-06-27 MED ORDER — "SYRINGE/NEEDLE (DISP) 25G X 5/8"" 3 ML MISC": 0 refills | Status: AC

## 2018-06-27 NOTE — Telephone Encounter (Signed)
Pt called back. She says:. He prints off rx for 3 months of hydrocodone.  Pt can not come and see him to get rx, she is asking for it to called in.  [facility]ses [facility] [address] [place].  Cb is [phone]

## 2018-06-27 NOTE — Addendum Note (Signed)
Addended by: Binnie Rail on: 06/27/2018 04:38 PM   Modules accepted: Orders

## 2018-07-05 ENCOUNTER — Other Ambulatory Visit: Payer: Self-pay | Admitting: Obstetrics and Gynecology

## 2018-07-05 MED ORDER — FLUCONAZOLE 150 MG PO TABS: 150.0000 mg | ORAL_TABLET | ORAL | 1 refills | Status: DC | PRN

## 2018-07-05 NOTE — Progress Notes (Signed)
Diflucan 150 mg e-scribed

## 2018-07-10 ENCOUNTER — Telehealth: Payer: Self-pay | Admitting: Nurse Practitioner

## 2018-07-10 DIAGNOSIS — K611 Rectal abscess: Secondary | ICD-10-CM

## 2018-07-10 DIAGNOSIS — N824 Other female intestinal-genital tract fistulae: Secondary | ICD-10-CM

## 2018-07-10 NOTE — Telephone Encounter (Signed)
Called patient and she is aware d/t insurance she will need to have imaging done at [facility]. I advised her they will call to schedule this. She voiced understanding.  PA done via [facility] website and was approved. Auth# [id number] and expires on 08/24/2018.  Called [facility] and spoke with Manus Gunning. She is aware order is in and will give patient a call.

## 2018-07-10 NOTE — Telephone Encounter (Signed)
Was able to talk to the patient after a couple missed calls. She is ok with the CT Pelvis (which has been ordered for a while although Radiology has been holding on non-emergent studies with COVID-19). Please call her and get radiology to schedule her as soon as possible (she has an appt with [facility] 07/24/18).  She continues to decline surgery currently. However, she's heard of a procedure with Dr. Morton Stall at [facility] "where they pack fat cells into the fistula to close it up. He was supposed to start doing it and then the virus hit." She sounds like she may be interested in that.

## 2018-07-10 NOTE — Addendum Note (Signed)
Addended by: Inge Rise on: 07/10/2018 09:45 AM   Modules accepted: Orders

## 2018-07-19 DIAGNOSIS — Z743 Need for continuous supervision: Secondary | ICD-10-CM | POA: Diagnosis not present

## 2018-07-19 DIAGNOSIS — J441 Chronic obstructive pulmonary disease with (acute) exacerbation: Secondary | ICD-10-CM | POA: Diagnosis not present

## 2018-07-19 DIAGNOSIS — R0602 Shortness of breath: Secondary | ICD-10-CM | POA: Diagnosis not present

## 2018-07-19 DIAGNOSIS — F17200 Nicotine dependence, unspecified, uncomplicated: Secondary | ICD-10-CM | POA: Diagnosis not present

## 2018-07-23 ENCOUNTER — Ambulatory Visit
Admission: RE | Admit: 2018-07-23 | Discharge: 2018-07-23 | Disposition: A | Discharge status: Home / Self Care | Payer: 59 | Source: Ambulatory Visit | Attending: Gastroenterology | Admitting: Gastroenterology

## 2018-07-23 DIAGNOSIS — K611 Rectal abscess: Secondary | ICD-10-CM

## 2018-07-23 DIAGNOSIS — N824 Other female intestinal-genital tract fistulae: Secondary | ICD-10-CM

## 2018-07-23 MED ORDER — IOPAMIDOL (ISOVUE-300) INJECTION 61%
100.0000 mL | Freq: Once | INTRAVENOUS | Status: AC | PRN
  Administered 2018-07-23: 100 mL via INTRAVENOUS

## 2018-07-24 ENCOUNTER — Ambulatory Visit: Payer: 59 | Admitting: Gastroenterology

## 2018-07-24 NOTE — Progress Notes (Signed)
PT is aware.

## 2018-07-28 ENCOUNTER — Other Ambulatory Visit: Payer: Self-pay | Admitting: Internal Medicine

## 2018-07-28 ENCOUNTER — Encounter (HOSPITAL_COMMUNITY): Payer: 59

## 2018-07-28 NOTE — Telephone Encounter (Signed)
No hx on [place] registry for last refill date.Marland KitchenJohny [name]

## 2018-07-29 ENCOUNTER — Other Ambulatory Visit: Payer: Self-pay

## 2018-07-29 MED ORDER — HYDROCODONE-ACETAMINOPHEN 10-325 MG PO TABS: 1.0000 | ORAL_TABLET | Freq: Three times a day (TID) | ORAL | 0 refills | Status: DC | PRN

## 2018-07-29 NOTE — Telephone Encounter (Signed)
RX was printed, please send electronically.  

## 2018-07-31 ENCOUNTER — Telehealth: Payer: Self-pay | Admitting: Internal Medicine

## 2018-07-31 NOTE — Telephone Encounter (Signed)
Copied from [place] [phone]. Topic: Quick Communication - Rx Refill/Question >> Jul 31, 2018  2:08 PM Rainey Pines A wrote: Medication: HYDROcodone-acetaminophen (NORCO) 10-325 MG tablet   Has the patient contacted their pharmacy?Yes (Agent: If no, request that the patient contact the pharmacy for the refill.) (Agent: If yes, when and what did the pharmacy advise?)Contact PCP  Preferred Pharmacy (with phone number or street name[id number] [place] [phone](Phone) [phone](Fax)    Agent: Please be advised that RX refills may take up to 3 business days. We ask that you follow-up with your pharmacy.

## 2018-07-31 NOTE — Telephone Encounter (Signed)
Request sent to PCP to send electronically

## 2018-08-01 MED ORDER — HYDROCODONE-ACETAMINOPHEN 10-325 MG PO TABS: 1.0000 | ORAL_TABLET | Freq: Three times a day (TID) | ORAL | 0 refills | Status: DC | PRN

## 2018-08-04 ENCOUNTER — Encounter (HOSPITAL_COMMUNITY)
Admission: RE | Admit: 2018-08-04 | Discharge: 2018-08-04 | Disposition: A | Discharge status: Home / Self Care | Payer: 59 | Source: Ambulatory Visit | Attending: Gastroenterology | Admitting: Gastroenterology

## 2018-08-04 ENCOUNTER — Encounter (HOSPITAL_COMMUNITY): Payer: Self-pay

## 2018-08-04 ENCOUNTER — Other Ambulatory Visit: Payer: Self-pay

## 2018-08-04 DIAGNOSIS — K5090 Crohn's disease, unspecified, without complications: Secondary | ICD-10-CM | POA: Insufficient documentation

## 2018-08-04 MED ORDER — VEDOLIZUMAB 300 MG IV SOLR
300.0000 mg | Freq: Once | INTRAVENOUS | Status: AC
  Administered 2018-08-04: 300 mg via INTRAVENOUS
  Filled 2018-08-04: qty 5

## 2018-08-04 MED ORDER — METHYLPREDNISOLONE SODIUM SUCC 40 MG IJ SOLR
40.0000 mg | Freq: Once | INTRAMUSCULAR | Status: AC
  Administered 2018-08-04: 40 mg via INTRAVENOUS

## 2018-08-04 MED ORDER — SODIUM CHLORIDE 0.9 % IV SOLN
Freq: Once | INTRAVENOUS | Status: AC
  Administered 2018-08-04: 08:00:00 via INTRAVENOUS

## 2018-08-10 ENCOUNTER — Other Ambulatory Visit: Payer: Self-pay

## 2018-08-12 ENCOUNTER — Other Ambulatory Visit: Payer: Self-pay | Admitting: Internal Medicine

## 2018-08-12 MED ORDER — KETOROLAC TROMETHAMINE 10 MG PO TABS: 10.0000 mg | ORAL_TABLET | Freq: Four times a day (QID) | ORAL | 0 refills | Status: DC | PRN

## 2018-08-14 ENCOUNTER — Other Ambulatory Visit: Payer: Self-pay

## 2018-08-14 ENCOUNTER — Encounter: Payer: Self-pay | Admitting: Gastroenterology

## 2018-08-14 ENCOUNTER — Telehealth: Payer: Self-pay

## 2018-08-14 ENCOUNTER — Other Ambulatory Visit: Payer: Self-pay | Admitting: Gastroenterology

## 2018-08-14 ENCOUNTER — Ambulatory Visit (INDEPENDENT_AMBULATORY_CARE_PROVIDER_SITE_OTHER): Payer: 59 | Admitting: Gastroenterology

## 2018-08-14 VITALS — BP 96/63 | HR 104 | Temp 97.6°F | Ht 62.0 in | Wt 148.0 lb

## 2018-08-14 DIAGNOSIS — N824 Other female intestinal-genital tract fistulae: Secondary | ICD-10-CM | POA: Diagnosis not present

## 2018-08-14 DIAGNOSIS — R0989 Other specified symptoms and signs involving the circulatory and respiratory systems: Secondary | ICD-10-CM

## 2018-08-14 DIAGNOSIS — I701 Atherosclerosis of renal artery: Secondary | ICD-10-CM

## 2018-08-14 DIAGNOSIS — K50013 Crohn's disease of small intestine with fistula: Secondary | ICD-10-CM | POA: Diagnosis not present

## 2018-08-14 DIAGNOSIS — I10 Essential (primary) hypertension: Secondary | ICD-10-CM

## 2018-08-14 DIAGNOSIS — E559 Vitamin D deficiency, unspecified: Secondary | ICD-10-CM

## 2018-08-14 DIAGNOSIS — N289 Disorder of kidney and ureter, unspecified: Secondary | ICD-10-CM

## 2018-08-14 NOTE — Assessment & Plan Note (Signed)
Cr ELEVATED APR 2020. RENAL BRUITS BILATERALLY ON EXAM.   COMPLETE RENAL DOPPLERS. SEE DR. PLOTNIKOV ABOUT DECREASED KIDNEY FUNCTION. FOLLOW UP IN 6 MOS.

## 2018-08-14 NOTE — Assessment & Plan Note (Signed)
FAIRLY WELL CONTROLLED.  CONTINUE TO MONITOR SYMPTOMS. STOP SMOKING. CONTINUE ENTYVIO. FOLLOW UP IN 6 MOS.

## 2018-08-14 NOTE — Telephone Encounter (Signed)
MyChart message sent to pt to inform of appt. Pt read message and is aware.

## 2018-08-14 NOTE — Progress Notes (Signed)
ON RECALL  °

## 2018-08-14 NOTE — Telephone Encounter (Signed)
Renal artery duplex scheduled for 08/19/18 at 9:00am, arrive at 8:45am. NPO after midnight. Pt will need to go to [facility] main entrance A and go to admitting. Tried to call pt, no answer, LMOVM for return call.

## 2018-08-14 NOTE — Progress Notes (Signed)
Subjective:    Patient ID: [name] [name], female    DOB: 1970/07/29, 48 y.o.   MRN: [id number]  Cassandria Anger, MD   HPI Mar 2020: had vomiting(3-4 DAYS), abdominal pain, diarrhea, BAD HA, and temperature up to 104.50F, CHILLS. NO RESPIRATORY SYMPTOMS. LASTED 5-7 DAYS. TOOK CLINDAMYCIN IN MAR 2020. JUN 2020: MOST SYMPTOMS RESOLVED AND FEELS HER "HOLE WANTS TO CLEAR U"P AND SOMETIME WHEN SHE HAS A NL BM IT BUSTS IT OPEN(BLEEDS). BMs: #4-7, MOSTLY IT'S A SOFT #4. GETTING ENTYVIO Q8 WEEKS-NO PROBLEMS. STILL SMOKING 1/ 2PK/DAY.   TODAY PT DENIES FEVER, CHILLS, HEMATOCHEZIA, HEMATEMESIS, nausea, vomiting, melena, diarrhea, CHEST PAIN, SHORTNESS OF BREATH, CHANGE IN BOWEL IN HABITS, constipation, abdominal pain, problems swallowing, OR heartburn or indigestion. HAS KNEE PAIN INTERMITTENTLY. OTHERWISE, NO SORES IN MOUTH, RASH ON LEGS, OR BACK PAIN.  Past Medical History:  Diagnosis Date  . Anxiety   . Arthritis    Dr. Eddie Dibbles  . Bartholin cyst 2008   vag.  [name] BIPOLAR AFFECTIVE DISORDER 04/14/2007  . Crohn's ON ENTYVIO SINCE FEB [id number]   COMPLICATED BY RECTOVAGINAL FISTULA. SJS WITH REMICADE. FAILED HUMIRA.  [name] Depression   . Elevated glucose 2010  . GERD (gastroesophageal reflux disease)   . Hypertension   . Kidney stone   . LBP (low back pain)    Dr. Mina Marble  . Perianal abscess 2009  . Vitamin B12 deficiency    Past Surgical History:  Procedure Laterality Date  . COLONOSCOPY  09/2008   Dr. Derrill Kay: ulcers, edema, possible fistula openings all seen in the distal 3 cm of anus/anal canal. 1cm pseudopolyp. rest of colon and TI normal. rectal biopsy with mild chronic active colitis. ileum bx normal.  . COLONOSCOPY WITH PROPOFOL N/A 03/29/2015   SLF: Severe proctocolitis limitied to cecum and rectum. Limited exam of the colon mucosa and anal canal.   . ELBOW SURGERY     left  . ESOPHAGOGASTRODUODENOSCOPY (EGD) WITH PROPOFOL N/A 03/29/2015   SLF: 1. Erosive gastritis duodentitis.   [name]  RECTOVAGINAL FISTULA CLOSURE     did not help   Allergies  Allergen Reactions  . Cephalexin     itching  . Codeine   . Guaifenesin   . Imitrex [Sumatriptan]     2 fingers got very hot  . Morphine   . 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 Sulfonamide Derivatives   . Ultram [Tramadol Hcl]     [employer] syndrome with either remicade or ultram   Current Outpatient Medications  Medication Sig    . ALPRAZolam (XANAX) 0.5 MG tablet Take 1 tablet (0.5 mg total) by mouth at bedtime as needed. (Patient taking differently: Take 0.5 mg by mouth 2 (two) times daily. )    . benazepril-hydrochlorthiazide (LOTENSIN HCT) 20-12.5 MG tablet Take 1 tablet by mouth daily.    . Cholecalciferol (VITAMIN D3) 250 MCG (10000 UT) capsule Take 10,000 Units by mouth daily.    . cyanocobalamin (,VITAMIN B-12,) 1000 MCG/ML injection INJECT 1ML INTO THE SKIN EVERY 14 DAYS (Patient taking differently: Every 10 days)    . HYDROcodone-acetaminophen (NORCO) 10-325 MG tablet Take 1 tablet by mouth every 8 (eight) hours as needed for severe pain. Please fill on or after 08/01/18    . lamoTRIgine (LAMICTAL) 200 MG tablet Take 1 tablet by mouth daily.    . norethindrone (AYGESTIN) 5 MG tablet Take 1 tablet (5 mg total) by mouth daily.    [name]  PROTONIX 40 MG tablet 1 PO 30 MINS PRIOR TO YOUR FIRST MEAL QD    . K-DUR) 10 MEQ tablet Take 1 tablet (10 mEq total) by mouth daily.    [name] PHENERGAN) 25 MG tablet Take 1 tablet BID PRN for nausea.    . sertraline (ZOLOFT) 100 MG tablet Take 200 mg by mouth daily.     .      . ENTYVIO 300 MG injection Inject into the vein. Every 8 weeks    .      .      .      .      .      .      .      [name] SYRINGE-NEEDLE, DISP, 3 ML (B-D SYRINGE/NEEDLE 3CC/25GX5/8) 25G X 5/8" 3 ML MISC Use to administer B12 injection every 14 days     Review of Systems PER HPI OTHERWISE ALL SYSTEMS ARE NEGATIVE.    Objective:   Physical Exam Vitals signs  reviewed.  Constitutional:      General: She is not in acute distress.    Appearance: She is well-developed.  HENT:     Head: Normocephalic and atraumatic.     Mouth/Throat:     Pharynx: No oropharyngeal exudate.     Comments: MASK IN PLACE Eyes:     General: No scleral icterus.    Pupils: Pupils are equal, round, and reactive to light.  Neck:     Musculoskeletal: Normal range of motion and neck supple.  Cardiovascular:     Rate and Rhythm: Normal rate and regular rhythm.     Heart sounds: Normal heart sounds.  Pulmonary:     Effort: Pulmonary effort is normal. No respiratory distress.     Breath sounds: Normal breath sounds.  Abdominal:     General: Bowel sounds are normal. There is no distension.     Palpations: Abdomen is soft.     Tenderness: There is no abdominal tenderness.     Comments: BILATERAL RENAL BRUITS  Musculoskeletal:     Right lower leg: No edema.     Left lower leg: No edema.  Lymphadenopathy:     Cervical: No cervical adenopathy.  Skin:    Findings: No rash.  Neurological:     Mental Status: She is alert and oriented to person, place, and time.     Comments: NO FOCAL DEFICITS  Psychiatric:        Mood and Affect: Mood normal.     Comments: NORMAL AFFECT       Assessment & Plan:

## 2018-08-14 NOTE — Assessment & Plan Note (Signed)
SYMPTOMS FAIRLY WELL CONTROLLED.   DRINK WATER TO KEEP YOUR URINE LIGHT YELLOW. FOLLOW A HIGH FIBER DIET. AVOID ITEMS THAT CAUSE BLOATING & GAS. STOP SMOKING. GET FLU SHOT. Continue ENTYVIO. ADD CALCIUM THREE TIMES A DAY AFTER MEALS BUT NOT ON AN EMPTY STOMACH. CONTINUE VITAMIN D. ONE WEEK PRIOR TO NEXT GI VISIT, COMPLETE LABS IN NOV 2020: CBC, CMP, VITAMIN D. FOLLOW UP IN 6 MOS.

## 2018-08-14 NOTE — Patient Instructions (Signed)
DRINK WATER TO KEEP YOUR URINE LIGHT YELLOW.  FOLLOW A HIGH FIBER DIET. AVOID ITEMS THAT CAUSE BLOATING & GAS.  STOP SMOKING.  GET YOUR FLU SHOT.   Continue ENTYVIO.  ADD CALCIUM THREE TIMES A DAY AFTER MEALS BUT NOT ON AN EMPTY STOMACH.  CONTINUE VITAMIN D.   ONE WEEK PRIOR TO NEXT GI VISIT, COMPLETE LABS IN NOV 2020: CBC, CMP, VITAMIN D.  COMPLETE RENAL DOPPLERS. SEE DR. PLOTNIKOV ABOUT DECREASED KIDNEY FUNCTION.   FOLLOW UP IN 6 MOS.

## 2018-08-14 NOTE — Progress Notes (Signed)
cc'd to pcp 

## 2018-08-19 ENCOUNTER — Encounter: Payer: Self-pay | Admitting: Gastroenterology

## 2018-08-19 ENCOUNTER — Other Ambulatory Visit: Payer: Self-pay

## 2018-08-19 ENCOUNTER — Ambulatory Visit (HOSPITAL_COMMUNITY)
Admission: RE | Admit: 2018-08-19 | Discharge: 2018-08-19 | Disposition: A | Discharge status: Home / Self Care | Payer: 59 | Source: Ambulatory Visit | Attending: Gastroenterology | Admitting: Gastroenterology

## 2018-08-19 DIAGNOSIS — I701 Atherosclerosis of renal artery: Secondary | ICD-10-CM | POA: Insufficient documentation

## 2018-08-19 DIAGNOSIS — R0989 Other specified symptoms and signs involving the circulatory and respiratory systems: Secondary | ICD-10-CM | POA: Diagnosis not present

## 2018-08-19 DIAGNOSIS — N289 Disorder of kidney and ureter, unspecified: Secondary | ICD-10-CM | POA: Diagnosis not present

## 2018-08-19 NOTE — Progress Notes (Signed)
LMOM to call.

## 2018-08-20 NOTE — Progress Notes (Signed)
LMOM to call.

## 2018-08-20 NOTE — Progress Notes (Signed)
CC'D TO PCP °

## 2018-08-21 ENCOUNTER — Other Ambulatory Visit: Payer: Self-pay | Admitting: Internal Medicine

## 2018-08-21 NOTE — Progress Notes (Signed)
Letter mailed for pt to call for results and plan of care.

## 2018-08-25 ENCOUNTER — Encounter: Payer: Self-pay | Admitting: Internal Medicine

## 2018-08-25 ENCOUNTER — Other Ambulatory Visit (INDEPENDENT_AMBULATORY_CARE_PROVIDER_SITE_OTHER): Payer: 59

## 2018-08-25 ENCOUNTER — Ambulatory Visit (INDEPENDENT_AMBULATORY_CARE_PROVIDER_SITE_OTHER): Payer: 59 | Admitting: Internal Medicine

## 2018-08-25 ENCOUNTER — Other Ambulatory Visit: Payer: Self-pay

## 2018-08-25 DIAGNOSIS — I701 Atherosclerosis of renal artery: Secondary | ICD-10-CM

## 2018-08-25 DIAGNOSIS — M5440 Lumbago with sciatica, unspecified side: Secondary | ICD-10-CM

## 2018-08-25 DIAGNOSIS — R5382 Chronic fatigue, unspecified: Secondary | ICD-10-CM

## 2018-08-25 DIAGNOSIS — E538 Deficiency of other specified B group vitamins: Secondary | ICD-10-CM | POA: Diagnosis not present

## 2018-08-25 DIAGNOSIS — G8929 Other chronic pain: Secondary | ICD-10-CM

## 2018-08-25 LAB — HEPATIC FUNCTION PANEL
ALT: 8 U/L (ref 0–35)
AST: 10 U/L (ref 0–37)
Albumin: 4.5 g/dL (ref 3.5–5.2)
Alkaline Phosphatase: 49 U/L (ref 39–117)
Bilirubin, Direct: 0.1 mg/dL (ref 0.0–0.3)
Total Bilirubin: 0.6 mg/dL (ref 0.2–1.2)
Total Protein: 7 g/dL (ref 6.0–8.3)

## 2018-08-25 LAB — CBC WITH DIFFERENTIAL/PLATELET
Basophils Absolute: 0 10*3/uL (ref 0.0–0.1)
Basophils Relative: 0.3 % (ref 0.0–3.0)
Eosinophils Absolute: 0.1 10*3/uL (ref 0.0–0.7)
Eosinophils Relative: 0.6 % (ref 0.0–5.0)
HCT: 37.6 % (ref 36.0–46.0)
Hemoglobin: 13.1 g/dL (ref 12.0–15.0)
Lymphocytes Relative: 17.3 % (ref 12.0–46.0)
Lymphs Abs: 1.4 10*3/uL (ref 0.7–4.0)
MCHC: 35 g/dL (ref 30.0–36.0)
MCV: 90.8 fl (ref 78.0–100.0)
Monocytes Absolute: 0.4 10*3/uL (ref 0.1–1.0)
Monocytes Relative: 5.2 % (ref 3.0–12.0)
Neutro Abs: 6.1 10*3/uL (ref 1.4–7.7)
Neutrophils Relative %: 76.6 % (ref 43.0–77.0)
Platelets: 131 10*3/uL — ABNORMAL LOW (ref 150.0–400.0)
RBC: 4.14 Mil/uL (ref 3.87–5.11)
RDW: 13.4 % (ref 11.5–15.5)
WBC: 8 10*3/uL (ref 4.0–10.5)

## 2018-08-25 LAB — BRAIN NATRIURETIC PEPTIDE: Pro B Natriuretic peptide (BNP): 70 pg/mL (ref 0.0–100.0)

## 2018-08-25 LAB — BASIC METABOLIC PANEL
BUN: 20 mg/dL (ref 6–23)
CO2: 27 mEq/L (ref 19–32)
Calcium: 8.9 mg/dL (ref 8.4–10.5)
Chloride: 104 mEq/L (ref 96–112)
Creatinine, Ser: 1.37 mg/dL — ABNORMAL HIGH (ref 0.40–1.20)
GFR: 41.03 mL/min — ABNORMAL LOW (ref >60.00)
Glucose, Bld: 89 mg/dL (ref 70–99)
Potassium: 3.9 mEq/L (ref 3.5–5.1)
Sodium: 139 mEq/L (ref 135–145)

## 2018-08-25 MED ORDER — CHANTIX STARTING MONTH PAK 0.5 MG X 11 & 1 MG X 42 PO TABS: ORAL_TABLET | ORAL | 0 refills | Status: DC

## 2018-08-25 MED ORDER — HYDROCODONE-ACETAMINOPHEN 10-325 MG PO TABS: 1.0000 | ORAL_TABLET | Freq: Three times a day (TID) | ORAL | 0 refills | Status: DC | PRN

## 2018-08-25 MED ORDER — VARENICLINE TARTRATE 1 MG PO TABS: 1.0000 mg | ORAL_TABLET | Freq: Two times a day (BID) | ORAL | 4 refills | Status: DC

## 2018-08-25 MED ORDER — DICLOFENAC SODIUM 1 % TD GEL: 1.0000 "application " | Freq: Four times a day (QID) | TRANSDERMAL | 3 refills | Status: DC

## 2018-08-25 NOTE — Assessment & Plan Note (Signed)
On B12 

## 2018-08-25 NOTE — Assessment & Plan Note (Signed)
Chronic and severe On disabiity  Potential benefits of a long term opioids use as well as potential risks (i.e. addiction risk, apnea etc) and complications (i.e. Somnolence, constipation and others) were explained to the patient and were aknowledged.

## 2018-08-25 NOTE — Addendum Note (Signed)
Addended by: Karren Cobble on: 08/25/2018 08:41 AM   Modules accepted: Orders

## 2018-08-25 NOTE — Assessment & Plan Note (Addendum)
Just had a renal artery [place], abd CT

## 2018-08-25 NOTE — Assessment & Plan Note (Signed)
[facility]

## 2018-08-25 NOTE — Progress Notes (Signed)
Subjective:  Patient ID: [name] [name], female    DOB: [birth year]  Age: 48 y.o. MRN: [id number]  CC: No chief complaint on file.   HPI [name] [name] presents for chronic pain, HTN, anxiety, B12 def C/o possible COVID sx's in March  Outpatient Medications Prior to Visit  Medication Sig Dispense Refill   ALPRAZolam (XANAX) 0.5 MG tablet Take 1 tablet (0.5 mg total) by mouth at bedtime as needed. (Patient taking differently: Take 0.5 mg by mouth 2 (two) times daily. ) 30 tablet 2   benazepril-hydrochlorthiazide (LOTENSIN HCT) 20-12.5 MG tablet Take 1 tablet by mouth daily. 90 tablet 3   Cholecalciferol (VITAMIN D3) 250 MCG (10000 UT) capsule Take 10,000 Units by mouth daily.     ciprofloxacin (CIPRO) 500 MG tablet Take 1 tablet (500 mg total) by mouth 2 (two) times daily. 28 tablet 1   cyanocobalamin (,VITAMIN B-12,) 1000 MCG/ML injection INJECT 1ML INTO THE SKIN EVERY 14 DAYS (Patient taking differently: Every 10 days) 6 mL 11   cyclobenzaprine (FLEXERIL) 10 MG tablet Take 1 tablet (10 mg total) by mouth 3 (three) times daily as needed for muscle spasms. 30 tablet 1   fluconazole (DIFLUCAN) 150 MG tablet Take 1 tablet (150 mg total) by mouth every three (3) days as needed. 2 tablet 1   HYDROcodone-acetaminophen (NORCO) 10-325 MG tablet Take 1 tablet by mouth every 8 (eight) hours as needed for severe pain. Please fill on or after 08/01/18 90 tablet 0   ketorolac (TORADOL) 10 MG tablet Take 1 tablet (10 mg total) by mouth every 6 (six) hours as needed. for pain 20 tablet 0   lamoTRIgine (LAMICTAL) 200 MG tablet Take 1 tablet by mouth daily.     norethindrone (AYGESTIN) 5 MG tablet Take 1 tablet (5 mg total) by mouth daily. 90 tablet 3   ondansetron (ZOFRAN) 4 MG tablet Take 1 tablet (4 mg total) by mouth every 8 (eight) hours as needed for nausea or vomiting. 20 tablet 0   pantoprazole (PROTONIX) 40 MG tablet 1 PO 30 MINUTES PRIOR TO YOUR FIRST MEAL QD 90 tablet 3   potassium  chloride (K-DUR) 10 MEQ tablet TAKE 1 TABLET BY MOUTH EVERY DAY 90 tablet 3   promethazine (PHENERGAN) 25 MG tablet Take 1 tablet (25 mg total) by mouth 2 (two) times daily as needed for nausea. 60 tablet 0   sertraline (ZOLOFT) 100 MG tablet Take 200 mg by mouth daily.      sodium chloride 0.9 % SOLN 250 mL with vedolizumab 300 MG SOLR 300 mg Inject 300 mg into the vein. LOADING DOSE THEN Q8 WEEKS     SYRINGE-NEEDLE, DISP, 3 ML (B-D SYRINGE/NEEDLE 3CC/25GX5/8) 25G X 5/8" 3 ML MISC Use to administer B12 injection every 14 days 24 each 0   vedolizumab (ENTYVIO) 300 MG injection Inject into the vein. Every 8 weeks     clindamycin (CLEOCIN) 300 MG capsule Take 1 capsule (300 mg total) by mouth 3 (three) times daily. (Patient not taking: Reported on 06/04/2018) 30 capsule 0   metroNIDAZOLE (FLAGYL) 500 MG tablet Take 1 tablet (500 mg total) by mouth 2 (two) times daily. (Patient not taking: Reported on 06/04/2018) 28 tablet 1   No facility-administered medications prior to visit.     ROS: Review of Systems  Constitutional: Positive for fatigue. Negative for activity change, appetite change, chills and unexpected weight change.  HENT: Negative for congestion, mouth sores and sinus pressure.   Eyes: Negative for visual  disturbance.  Respiratory: Negative for cough and chest tightness.   Gastrointestinal: Positive for abdominal distention, abdominal pain and diarrhea. Negative for nausea.  Genitourinary: Negative for difficulty urinating, frequency and vaginal pain.  Musculoskeletal: Positive for back pain. Negative for gait problem.  Skin: Negative for pallor and rash.  Neurological: Negative for dizziness, tremors, weakness, numbness and headaches.  Psychiatric/Behavioral: Negative for confusion and sleep disturbance.    Objective:  BP 104/62 (BP Location: Left Arm, Patient Position: Sitting, Cuff Size: Normal)    Pulse 88    Temp 98.7 F (37.1 C) (Oral)    Ht 5' 2"  (1.575 m)    Wt 147 lb  (66.7 kg)    SpO2 98%    BMI 26.89 kg/m   BP Readings from Last 3 Encounters:  08/25/18 104/62  08/14/18 96/63  08/04/18 (!) 105/57    Wt Readings from Last 3 Encounters:  08/25/18 147 lb (66.7 kg)  08/14/18 148 lb (67.1 kg)  08/04/18 156 lb 15.5 oz (71.2 kg)    Physical Exam Constitutional:      General: She is not in acute distress.    Appearance: She is well-developed.  HENT:     Head: Normocephalic.     Right Ear: External ear normal.     Left Ear: External ear normal.     Nose: Nose normal.  Eyes:     General:        Right eye: No discharge.        Left eye: No discharge.     Conjunctiva/sclera: Conjunctivae normal.     Pupils: Pupils are equal, round, and reactive to light.  Neck:     Musculoskeletal: Normal range of motion and neck supple.     Thyroid: No thyromegaly.     Vascular: No JVD.     Trachea: No tracheal deviation.  Cardiovascular:     Rate and Rhythm: Normal rate and regular rhythm.     Heart sounds: Murmur present.  Pulmonary:     Effort: No respiratory distress.     Breath sounds: No stridor. No wheezing.  Abdominal:     General: Bowel sounds are normal. There is no distension.     Palpations: Abdomen is soft. There is no mass.     Tenderness: There is no abdominal tenderness. There is no guarding or rebound.  Musculoskeletal:        General: Tenderness present.  Lymphadenopathy:     Cervical: No cervical adenopathy.  Skin:    Findings: No erythema or rash.  Neurological:     Cranial Nerves: No cranial nerve deficit.     Motor: No abnormal muscle tone.     Coordination: Coordination normal.     Deep Tendon Reflexes: Reflexes normal.  Psychiatric:        Behavior: Behavior normal.        Thought Content: Thought content normal.        Judgment: Judgment normal.   LS w/pain  Lab Results  Component Value Date   WBC 8.3 05/28/2018   HGB 13.1 05/28/2018   HCT 39.0 05/28/2018   PLT 181 05/28/2018   GLUCOSE 110 (H) 05/28/2018   CHOL  168 08/30/2014   TRIG 99.0 08/30/2014   HDL 27.40 (L) 08/30/2014   LDLDIRECT 171.9 05/01/2011   LDLCALC 121 (H) 08/30/2014   ALT 13 05/28/2018   AST 13 (L) 05/28/2018   NA 139 05/28/2018   K 3.3 (L) 05/28/2018   CL 106 05/28/2018   CREATININE  1.52 (H) 05/28/2018   BUN 21 (H) 05/28/2018   CO2 22 05/28/2018   TSH 0.585 05/28/2018    Vas US Renal Artery Duplex  Result Date: 08/19/2018 ABDOMINAL VISCERAL Indications: renal bruit, renal insufficiency. High Risk Factors: Hypertension. Comparison Study: No prior. Performing Technologist: Oda Cogan RDMS, RVT  Examination Guidelines: A complete evaluation includes B-mode imaging, spectral Doppler, color Doppler, and power Doppler as needed of all accessible portions of each vessel. Bilateral testing is considered an integral part of a complete examination. Limited examinations for reoccurring indications may be performed as noted.  Duplex Findings: +--------------------+--------+--------+------+--------+  Mesenteric           PSV cm/s EDV cm/s Plaque Comments  +--------------------+--------+--------+------+--------+  Aorta Prox              73                              +--------------------+--------+--------+------+--------+  Celiac Artery Origin   168                              +--------------------+--------+--------+------+--------+  SMA Origin             254                              +--------------------+--------+--------+------+--------+  +------------------+--------+--------+-------+  Right Renal Artery PSV cm/s EDV cm/s Comment  +------------------+--------+--------+-------+  Origin                79       19             +------------------+--------+--------+-------+  Proximal             112       26             +------------------+--------+--------+-------+  Mid                  123       29             +------------------+--------+--------+-------+  Distal                95       30              +------------------+--------+--------+-------+ +-----------------+--------+--------+-------+  Left Renal Artery PSV cm/s EDV cm/s Comment  +-----------------+--------+--------+-------+  Origin               80       24             +-----------------+--------+--------+-------+  Proximal            104       22             +-----------------+--------+--------+-------+  Mid                 100       25             +-----------------+--------+--------+-------+  Distal              101       33             +-----------------+--------+--------+-------+ Technologist observations Renal Artery(s):Enlarged spleen was noted 16.0 x 13.5 cm, which is known. +------------+--------+--------+----+-----------+--------+--------+----+  Right Kidney PSV cm/s EDV cm/s RI  Left Kidney PSV cm/s EDV cm/s RI    +------------+--------+--------+----+-----------+--------+--------+----+  Upper Pole   36       11       0.67 Upper Pole  47       17       0.64  +------------+--------+--------+----+-----------+--------+--------+----+  Mid          43       13       0.68 Mid         26       11       0.57  +------------+--------+--------+----+-----------+--------+--------+----+  Lower Pole   31       11       0.64 Lower Pole  24       12       0.52  +------------+--------+--------+----+-----------+--------+--------+----+  Hilar        61       16       0.74 Hilar       56       19       0.66  +------------+--------+--------+----+-----------+--------+--------+----+ +------------------+----------+------------------+---------+  Right Kidney                  Left Kidney                   +------------------+----------+------------------+---------+  RAR                           RAR                           +------------------+----------+------------------+---------+  RAR (manual)       1.5        RAR (manual)       1.4        +------------------+----------+------------------+---------+  Cortex             35/15 cm/s Cortex             26/9 cm/s   +------------------+----------+------------------+---------+  Cortex thickness              Corex thickness               +------------------+----------+------------------+---------+  Kidney length (cm) 10.20      Kidney length (cm) 10.60      +------------------+----------+------------------+---------+  Summary: Renal:  Right: No evidence of right renal artery stenosis. Normal right        Resisitive Index. Normal size right kidney. Left:  No evidence of left renal artery stenosis. Normal left        Resistive Index. Normal size of left kidney.  *See table(s) above for measurements and observations.  Diagnosing physician: Harold Barban MD  Electronically signed by Harold Barban MD on 08/19/2018 at 1:20:14 PM.    Final     Assessment & Plan:   There are no diagnoses linked to this encounter.   No orders of the defined types were placed in this encounter.    Follow-up: No follow-ups on file.  [name], MD

## 2018-08-26 LAB — SAR COV2 SEROLOGY (COVID19)AB(IGG),IA: SARS CoV2 AB IGG: NEGATIVE

## 2018-09-05 NOTE — Telephone Encounter (Signed)
LMTCB.  Does the patient have a history of failure, contraindication, or intolerance to ONE of the following: Nicotine replacement patches OTC (for example, Nicoderm CQ-OTC), Nicotine gum OTC (for example, Nicorette gum- OTC), Nicotine lozenge or mini-lozenge OTC (for example, Nicorette lozenge-OTC)?

## 2018-09-18 ENCOUNTER — Telehealth: Payer: Self-pay | Admitting: *Deleted

## 2018-09-18 NOTE — Telephone Encounter (Signed)
Received fax from [facility]- Chantix 0.5 mg and 1 mg is approved. Pt informed via MyChart.

## 2018-09-22 ENCOUNTER — Encounter (HOSPITAL_COMMUNITY): Payer: 59

## 2018-09-29 ENCOUNTER — Encounter (HOSPITAL_COMMUNITY): Payer: Self-pay

## 2018-09-29 ENCOUNTER — Encounter (HOSPITAL_COMMUNITY)
Admission: RE | Admit: 2018-09-29 | Discharge: 2018-09-29 | Disposition: A | Discharge status: Home / Self Care | Payer: 59 | Source: Ambulatory Visit | Attending: Gastroenterology | Admitting: Gastroenterology

## 2018-09-29 ENCOUNTER — Other Ambulatory Visit: Payer: Self-pay

## 2018-09-29 DIAGNOSIS — K5090 Crohn's disease, unspecified, without complications: Secondary | ICD-10-CM | POA: Diagnosis not present

## 2018-09-29 MED ORDER — SODIUM CHLORIDE 0.9 % IV SOLN
Freq: Once | INTRAVENOUS | Status: AC
  Administered 2018-09-29: 09:00:00 via INTRAVENOUS

## 2018-09-29 MED ORDER — VEDOLIZUMAB 300 MG IV SOLR
300.0000 mg | Freq: Once | INTRAVENOUS | Status: AC
  Administered 2018-09-29: 09:00:00 300 mg via INTRAVENOUS
  Filled 2018-09-29: qty 5

## 2018-09-29 MED ORDER — METHYLPREDNISOLONE SODIUM SUCC 40 MG IJ SOLR
40.0000 mg | Freq: Once | INTRAMUSCULAR | Status: AC
  Administered 2018-09-29: 40 mg via INTRAVENOUS

## 2018-09-29 MED ORDER — METHYLPREDNISOLONE SODIUM SUCC 40 MG IJ SOLR
INTRAMUSCULAR | Status: AC
  Filled 2018-09-29: qty 1

## 2018-10-05 ENCOUNTER — Other Ambulatory Visit: Payer: Self-pay | Admitting: Internal Medicine

## 2018-11-06 ENCOUNTER — Other Ambulatory Visit: Payer: Self-pay | Admitting: Internal Medicine

## 2018-11-06 ENCOUNTER — Other Ambulatory Visit: Payer: Self-pay

## 2018-11-06 NOTE — Telephone Encounter (Signed)
Copied from [place] [id number]. Topic: Quick Communication - Rx Refill/Question >> Nov 06, 2018  2:55 PM Mcneil, Ja-Kwan wrote: Medication: HYDROcodone-acetaminophen (NORCO) 10-325 MG tablet  Has the patient contacted their pharmacy? no  Preferred Pharmacy (with phone number or street name): [id number] [place] [phone](Phone)  [phone](Fax)  Agent: Please be advised that RX refills may take up to 3 business days. We ask that you follow-up with your pharmacy.

## 2018-11-06 NOTE — Telephone Encounter (Signed)
Requested medication (s) are due for refill today: yes  Requested medication (s) are on the active medication list: yes  Last refill:  08/25/2018  Future visit scheduled: yes  Notes to clinic:  Refill cannot be delegated    Requested Prescriptions  Pending Prescriptions Disp Refills   HYDROcodone-acetaminophen (NORCO) 10-325 MG tablet 90 tablet 0    Sig: Take 1 tablet by mouth every 8 (eight) hours as needed for severe pain. Please fill on or after 08/01/18     Not Delegated - Analgesics:  Opioid Agonist Combinations Failed - 11/06/2018  3:08 PM      Failed - This refill cannot be delegated      Failed - Urine Drug Screen completed in last 360 days.      Passed - Valid encounter within last 6 months    Recent Outpatient Visits          2 months ago Renal artery stenosis ([place])   [place], Evie Lacks, MD   5 months ago Essential hypertension, benign   [place] Plot[place], Evie Lacks, MD   8 months ago Essential hypertension, benign   [place] Plot[place], Evie Lacks, MD   11 months ago Migraine with aura and without status migrainosus, not intractable   [place], Evie Lacks, MD   1 year ago Essential hypertension, benign   [place], Evie Lacks, MD      Future Appointments            In 2 weeks Plotnikov, Evie Lacks, MD [place], [place]

## 2018-11-07 MED ORDER — HYDROCODONE-ACETAMINOPHEN 10-325 MG PO TABS: 1.0000 | ORAL_TABLET | Freq: Three times a day (TID) | ORAL | 0 refills | Status: DC | PRN

## 2018-11-24 ENCOUNTER — Other Ambulatory Visit: Payer: Self-pay

## 2018-11-24 ENCOUNTER — Encounter: Payer: Self-pay | Admitting: Internal Medicine

## 2018-11-24 ENCOUNTER — Ambulatory Visit (INDEPENDENT_AMBULATORY_CARE_PROVIDER_SITE_OTHER): Payer: 59 | Admitting: Internal Medicine

## 2018-11-24 ENCOUNTER — Other Ambulatory Visit (INDEPENDENT_AMBULATORY_CARE_PROVIDER_SITE_OTHER): Payer: 59

## 2018-11-24 DIAGNOSIS — M5440 Lumbago with sciatica, unspecified side: Secondary | ICD-10-CM | POA: Diagnosis not present

## 2018-11-24 DIAGNOSIS — E538 Deficiency of other specified B group vitamins: Secondary | ICD-10-CM | POA: Diagnosis not present

## 2018-11-24 DIAGNOSIS — F32A Depression, unspecified: Secondary | ICD-10-CM

## 2018-11-24 DIAGNOSIS — I10 Essential (primary) hypertension: Secondary | ICD-10-CM

## 2018-11-24 DIAGNOSIS — K50013 Crohn's disease of small intestine with fistula: Secondary | ICD-10-CM | POA: Diagnosis not present

## 2018-11-24 DIAGNOSIS — I701 Atherosclerosis of renal artery: Secondary | ICD-10-CM

## 2018-11-24 DIAGNOSIS — G8929 Other chronic pain: Secondary | ICD-10-CM

## 2018-11-24 DIAGNOSIS — F329 Major depressive disorder, single episode, unspecified: Secondary | ICD-10-CM

## 2018-11-24 LAB — CBC WITH DIFFERENTIAL/PLATELET
Basophils Absolute: 0 10*3/uL (ref 0.0–0.1)
Basophils Relative: 0.5 % (ref 0.0–3.0)
Eosinophils Absolute: 0.1 10*3/uL (ref 0.0–0.7)
Eosinophils Relative: 0.9 % (ref 0.0–5.0)
HCT: 40.7 % (ref 36.0–46.0)
Hemoglobin: 14.2 g/dL (ref 12.0–15.0)
Lymphocytes Relative: 22.7 % (ref 12.0–46.0)
Lymphs Abs: 1.6 10*3/uL (ref 0.7–4.0)
MCHC: 34.9 g/dL (ref 30.0–36.0)
MCV: 90.9 fl (ref 78.0–100.0)
Monocytes Absolute: 0.4 10*3/uL (ref 0.1–1.0)
Monocytes Relative: 5.1 % (ref 3.0–12.0)
Neutro Abs: 5.1 10*3/uL (ref 1.4–7.7)
Neutrophils Relative %: 70.8 % (ref 43.0–77.0)
Platelets: 135 10*3/uL — ABNORMAL LOW (ref 150.0–400.0)
RBC: 4.48 Mil/uL (ref 3.87–5.11)
RDW: 12.8 % (ref 11.5–15.5)
WBC: 7.3 10*3/uL (ref 4.0–10.5)

## 2018-11-24 LAB — HEPATIC FUNCTION PANEL
ALT: 7 U/L (ref 0–35)
AST: 9 U/L (ref 0–37)
Albumin: 4.6 g/dL (ref 3.5–5.2)
Alkaline Phosphatase: 52 U/L (ref 39–117)
Bilirubin, Direct: 0.1 mg/dL (ref 0.0–0.3)
Total Bilirubin: 0.5 mg/dL (ref 0.2–1.2)
Total Protein: 7 g/dL (ref 6.0–8.3)

## 2018-11-24 LAB — BASIC METABOLIC PANEL
BUN: 20 mg/dL (ref 6–23)
CO2: 27 mEq/L (ref 19–32)
Calcium: 9.7 mg/dL (ref 8.4–10.5)
Chloride: 103 mEq/L (ref 96–112)
Creatinine, Ser: 1.51 mg/dL — ABNORMAL HIGH (ref 0.40–1.20)
GFR: 36.63 mL/min — ABNORMAL LOW (ref >60.00)
Glucose, Bld: 84 mg/dL (ref 70–99)
Potassium: 4 mEq/L (ref 3.5–5.1)
Sodium: 138 mEq/L (ref 135–145)

## 2018-11-24 LAB — VITAMIN D 25 HYDROXY (VIT D DEFICIENCY, FRACTURES): VITD: >120 ng/mL

## 2018-11-24 LAB — VITAMIN B12: Vitamin B-12: 650 pg/mL (ref 211–911)

## 2018-11-24 MED ORDER — HYDROCODONE-ACETAMINOPHEN 10-325 MG PO TABS: 1.0000 | ORAL_TABLET | Freq: Three times a day (TID) | ORAL | 0 refills | Status: DC | PRN

## 2018-11-24 NOTE — Progress Notes (Signed)
Subjective:  Patient ID: [name] [name], female    DOB: 1970-05-25  Age: 48 y.o. MRN: [id number]  CC: No chief complaint on file.   HPI [name] [name] presents for chronic pain, HTN, depression f/u  Outpatient Medications Prior to Visit  Medication Sig Dispense Refill  . ALPRAZolam (XANAX) 0.5 MG tablet Take 1 tablet (0.5 mg total) by mouth at bedtime as needed. (Patient taking differently: Take 0.5 mg by mouth 2 (two) times daily. ) 30 tablet 2  . benazepril-hydrochlorthiazide (LOTENSIN HCT) 20-12.5 MG tablet Take 1 tablet by mouth daily. 90 tablet 3  . Cholecalciferol (VITAMIN D3) 250 MCG (10000 UT) capsule Take 10,000 Units by mouth daily.    . ciprofloxacin (CIPRO) 500 MG tablet Take 1 tablet (500 mg total) by mouth 2 (two) times daily. 28 tablet 1  . cyanocobalamin (,VITAMIN B-12,) 1000 MCG/ML injection INJECT 1ML INTO THE SKIN EVERY 14 DAYS (Patient taking differently: Every 10 days) 6 mL 11  . cyclobenzaprine (FLEXERIL) 10 MG tablet Take 1 tablet (10 mg total) by mouth 3 (three) times daily as needed for muscle spasms. 30 tablet 1  . cyclobenzaprine (FLEXERIL) 5 MG tablet TAKE 1 TABLET (5 MG TOTAL) BY MOUTH AT BEDTIME AS NEEDED FOR MUSCLE SPASMS. 30 tablet 1  . diclofenac sodium (VOLTAREN) 1 % GEL Apply 1 application topically 4 (four) times daily. 100 g 3  . fluconazole (DIFLUCAN) 150 MG tablet Take 1 tablet (150 mg total) by mouth every three (3) days as needed. 2 tablet 1  . HYDROcodone-acetaminophen (NORCO) 10-325 MG tablet Take 1 tablet by mouth every 8 (eight) hours as needed for severe pain. Please fill on or after 08/01/18 90 tablet 0  . ketorolac (TORADOL) 10 MG tablet Take 1 tablet (10 mg total) by mouth every 6 (six) hours as needed. for pain 20 tablet 0  . lamoTRIgine (LAMICTAL) 200 MG tablet Take 1 tablet by mouth daily.    . norethindrone (AYGESTIN) 5 MG tablet Take 1 tablet (5 mg total) by mouth daily. 90 tablet 3  . ondansetron (ZOFRAN) 4 MG tablet Take 1 tablet (4 mg  total) by mouth every 8 (eight) hours as needed for nausea or vomiting. 20 tablet 0  . pantoprazole (PROTONIX) 40 MG tablet 1 PO 30 MINUTES PRIOR TO YOUR FIRST MEAL QD 90 tablet 3  . potassium chloride (K-DUR) 10 MEQ tablet TAKE 1 TABLET BY MOUTH EVERY DAY 90 tablet 3  . promethazine (PHENERGAN) 25 MG tablet Take 1 tablet (25 mg total) by mouth 2 (two) times daily as needed for nausea. 60 tablet 0  . sertraline (ZOLOFT) 100 MG tablet Take 200 mg by mouth daily.     . sodium chloride 0.9 % SOLN 250 mL with vedolizumab 300 MG SOLR 300 mg Inject 300 mg into the vein. LOADING DOSE THEN Q8 WEEKS    . SYRINGE-NEEDLE, DISP, 3 ML (B-D SYRINGE/NEEDLE 3CC/25GX5/8) 25G X 5/8" 3 ML MISC Use to administer B12 injection every 14 days 24 each 0  . varenicline (CHANTIX CONTINUING MONTH PAK) 1 MG tablet Take 1 tablet (1 mg total) by mouth 2 (two) times daily. 60 tablet 4  . varenicline (CHANTIX STARTING MONTH PAK) 0.5 MG X 11 & 1 MG X 42 tablet Take one 0.5 mg tablet by mouth once daily for 3 days, then increase to one 0.5 mg tablet twice daily for 4 days, then increase to one 1 mg tablet twice daily. 53 tablet 0  . vedolizumab (ENTYVIO) 300  MG injection Inject into the vein. Every 8 weeks     No facility-administered medications prior to visit.     ROS: Review of Systems  Constitutional: Negative for activity change, appetite change, chills, fatigue and unexpected weight change.  HENT: Negative for congestion, mouth sores and sinus pressure.   Eyes: Negative for visual disturbance.  Respiratory: Negative for cough and chest tightness.   Gastrointestinal: Negative for abdominal pain and nausea.  Genitourinary: Negative for difficulty urinating, frequency and vaginal pain.  Musculoskeletal: Positive for back pain. Negative for gait problem.  Skin: Negative for pallor and rash.  Neurological: Negative for dizziness, tremors, weakness, numbness and headaches.  Psychiatric/Behavioral: Negative for confusion,  sleep disturbance and suicidal ideas.    Objective:  BP 122/72   Pulse 86   Temp 97.8 F (36.6 C)   Wt 152 lb (68.9 kg)   SpO2 98%   BMI 26.09 kg/m   BP Readings from Last 3 Encounters:  11/24/18 122/72  09/29/18 102/61  08/25/18 104/62    Wt Readings from Last 3 Encounters:  11/24/18 152 lb (68.9 kg)  09/29/18 147 lb 0.8 oz (66.7 kg)  08/25/18 147 lb (66.7 kg)    Physical Exam Constitutional:      General: She is not in acute distress.    Appearance: She is well-developed.  HENT:     Head: Normocephalic.     Right Ear: External ear normal.     Left Ear: External ear normal.     Nose: Nose normal.  Eyes:     General:        Right eye: No discharge.        Left eye: No discharge.     Conjunctiva/sclera: Conjunctivae normal.     Pupils: Pupils are equal, round, and reactive to light.  Neck:     Musculoskeletal: Normal range of motion and neck supple.     Thyroid: No thyromegaly.     Vascular: No JVD.     Trachea: No tracheal deviation.  Cardiovascular:     Rate and Rhythm: Normal rate and regular rhythm.     Heart sounds: Normal heart sounds.  Pulmonary:     Effort: No respiratory distress.     Breath sounds: No stridor. No wheezing.  Abdominal:     General: Bowel sounds are normal. There is no distension.     Palpations: Abdomen is soft. There is no mass.     Tenderness: There is no abdominal tenderness. There is no guarding or rebound.  Musculoskeletal:        General: Tenderness present.  Lymphadenopathy:     Cervical: No cervical adenopathy.  Skin:    Findings: No erythema or rash.  Neurological:     Cranial Nerves: No cranial nerve deficit.     Motor: No abnormal muscle tone.     Coordination: Coordination normal.     Deep Tendon Reflexes: Reflexes normal.  Psychiatric:        Behavior: Behavior normal.        Thought Content: Thought content normal.        Judgment: Judgment normal.   LS tender  Lab Results  Component Value Date   WBC 8.0  08/25/2018   HGB 13.1 08/25/2018   HCT 37.6 08/25/2018   PLT 131.0 (L) 08/25/2018   GLUCOSE 89 08/25/2018   CHOL 168 08/30/2014   TRIG 99.0 08/30/2014   HDL 27.40 (L) 08/30/2014   LDLDIRECT 171.9 05/01/2011   LDLCALC 121 (H) 08/30/2014  ALT 8 08/25/2018   AST 10 08/25/2018   NA 139 08/25/2018   K 3.9 08/25/2018   CL 104 08/25/2018   CREATININE 1.37 (H) 08/25/2018   BUN 20 08/25/2018   CO2 27 08/25/2018   TSH 0.585 05/28/2018    No results found.  Assessment & Plan:   There are no diagnoses linked to this encounter.   No orders of the defined types were placed in this encounter.    Follow-up: No follow-ups on file.  [name], MD

## 2018-11-24 NOTE — Assessment & Plan Note (Signed)
Lotensin HCT

## 2018-11-24 NOTE — Assessment & Plan Note (Signed)
Rx: Norco; Toradol, Demerol - rare prn Chronic and severe

## 2018-11-24 NOTE — Assessment & Plan Note (Signed)
Zoloft

## 2018-11-24 NOTE — Assessment & Plan Note (Signed)
On Entyvio x2nd year

## 2018-11-24 NOTE — Assessment & Plan Note (Signed)
On B12 

## 2018-11-25 ENCOUNTER — Encounter (HOSPITAL_COMMUNITY): Payer: Self-pay

## 2018-11-25 ENCOUNTER — Encounter (HOSPITAL_COMMUNITY)
Admission: RE | Admit: 2018-11-25 | Discharge: 2018-11-25 | Disposition: A | Discharge status: Home / Self Care | Payer: 59 | Source: Ambulatory Visit | Attending: Gastroenterology | Admitting: Gastroenterology

## 2018-11-25 ENCOUNTER — Other Ambulatory Visit: Payer: Self-pay

## 2018-11-25 DIAGNOSIS — K5090 Crohn's disease, unspecified, without complications: Secondary | ICD-10-CM | POA: Insufficient documentation

## 2018-11-25 MED ORDER — SODIUM CHLORIDE 0.9 % IV SOLN
Freq: Once | INTRAVENOUS | Status: AC
  Administered 2018-11-25: 250 mL via INTRAVENOUS

## 2018-11-25 MED ORDER — VEDOLIZUMAB 300 MG IV SOLR
300.0000 mg | Freq: Once | INTRAVENOUS | Status: AC
  Administered 2018-11-25: 300 mg via INTRAVENOUS
  Filled 2018-11-25: qty 5

## 2018-11-25 MED ORDER — METHYLPREDNISOLONE SODIUM SUCC 40 MG IJ SOLR
40.0000 mg | Freq: Once | INTRAMUSCULAR | Status: AC
  Administered 2018-11-25: 08:00:00 40 mg via INTRAVENOUS
  Filled 2018-11-25: qty 1

## 2018-11-27 ENCOUNTER — Other Ambulatory Visit: Payer: Self-pay | Admitting: Internal Medicine

## 2018-11-27 DIAGNOSIS — T452X1A Poisoning by vitamins, accidental (unintentional), initial encounter: Secondary | ICD-10-CM

## 2018-12-10 ENCOUNTER — Emergency Department (HOSPITAL_COMMUNITY): Payer: 59

## 2018-12-10 ENCOUNTER — Inpatient Hospital Stay (HOSPITAL_COMMUNITY)
Admission: EM | Admit: 2018-12-10 | Discharge: 2018-12-15 | DRG: 871 | Disposition: A | Discharge status: Home / Self Care | Payer: 59 | Attending: Internal Medicine | Admitting: Internal Medicine

## 2018-12-10 ENCOUNTER — Encounter (HOSPITAL_COMMUNITY): Payer: Self-pay | Admitting: *Deleted

## 2018-12-10 ENCOUNTER — Other Ambulatory Visit: Payer: Self-pay

## 2018-12-10 DIAGNOSIS — J189 Pneumonia, unspecified organism: Secondary | ICD-10-CM

## 2018-12-10 DIAGNOSIS — J810 Acute pulmonary edema: Secondary | ICD-10-CM | POA: Diagnosis present

## 2018-12-10 DIAGNOSIS — Z20828 Contact with and (suspected) exposure to other viral communicable diseases: Secondary | ICD-10-CM | POA: Diagnosis present

## 2018-12-10 DIAGNOSIS — N179 Acute kidney failure, unspecified: Secondary | ICD-10-CM | POA: Diagnosis present

## 2018-12-10 DIAGNOSIS — Z803 Family history of malignant neoplasm of breast: Secondary | ICD-10-CM

## 2018-12-10 DIAGNOSIS — D6959 Other secondary thrombocytopenia: Secondary | ICD-10-CM | POA: Diagnosis present

## 2018-12-10 DIAGNOSIS — J449 Chronic obstructive pulmonary disease, unspecified: Secondary | ICD-10-CM | POA: Diagnosis present

## 2018-12-10 DIAGNOSIS — G8929 Other chronic pain: Secondary | ICD-10-CM | POA: Diagnosis present

## 2018-12-10 DIAGNOSIS — E860 Dehydration: Secondary | ICD-10-CM

## 2018-12-10 DIAGNOSIS — Z833 Family history of diabetes mellitus: Secondary | ICD-10-CM

## 2018-12-10 DIAGNOSIS — D649 Anemia, unspecified: Secondary | ICD-10-CM

## 2018-12-10 DIAGNOSIS — A419 Sepsis, unspecified organism: Secondary | ICD-10-CM | POA: Diagnosis present

## 2018-12-10 DIAGNOSIS — F419 Anxiety disorder, unspecified: Secondary | ICD-10-CM | POA: Diagnosis present

## 2018-12-10 DIAGNOSIS — J9601 Acute respiratory failure with hypoxia: Secondary | ICD-10-CM | POA: Diagnosis present

## 2018-12-10 DIAGNOSIS — D84821 Immunodeficiency due to drugs: Secondary | ICD-10-CM | POA: Diagnosis present

## 2018-12-10 DIAGNOSIS — R6521 Severe sepsis with septic shock: Secondary | ICD-10-CM | POA: Diagnosis present

## 2018-12-10 DIAGNOSIS — Z87442 Personal history of urinary calculi: Secondary | ICD-10-CM

## 2018-12-10 DIAGNOSIS — J690 Pneumonitis due to inhalation of food and vomit: Secondary | ICD-10-CM | POA: Diagnosis present

## 2018-12-10 DIAGNOSIS — I10 Essential (primary) hypertension: Secondary | ICD-10-CM | POA: Diagnosis present

## 2018-12-10 DIAGNOSIS — Z8249 Family history of ischemic heart disease and other diseases of the circulatory system: Secondary | ICD-10-CM

## 2018-12-10 DIAGNOSIS — E538 Deficiency of other specified B group vitamins: Secondary | ICD-10-CM | POA: Diagnosis present

## 2018-12-10 DIAGNOSIS — N823 Fistula of vagina to large intestine: Secondary | ICD-10-CM | POA: Diagnosis present

## 2018-12-10 DIAGNOSIS — K219 Gastro-esophageal reflux disease without esophagitis: Secondary | ICD-10-CM | POA: Diagnosis present

## 2018-12-10 DIAGNOSIS — D849 Immunodeficiency, unspecified: Secondary | ICD-10-CM | POA: Diagnosis present

## 2018-12-10 DIAGNOSIS — I9589 Other hypotension: Secondary | ICD-10-CM | POA: Diagnosis present

## 2018-12-10 DIAGNOSIS — F319 Bipolar disorder, unspecified: Secondary | ICD-10-CM | POA: Diagnosis present

## 2018-12-10 DIAGNOSIS — A4151 Sepsis due to Escherichia coli [E. coli]: Principal | ICD-10-CM | POA: Diagnosis present

## 2018-12-10 DIAGNOSIS — R161 Splenomegaly, not elsewhere classified: Secondary | ICD-10-CM | POA: Diagnosis present

## 2018-12-10 DIAGNOSIS — K5090 Crohn's disease, unspecified, without complications: Secondary | ICD-10-CM | POA: Diagnosis present

## 2018-12-10 DIAGNOSIS — Z79891 Long term (current) use of opiate analgesic: Secondary | ICD-10-CM

## 2018-12-10 DIAGNOSIS — Z792 Long term (current) use of antibiotics: Secondary | ICD-10-CM

## 2018-12-10 DIAGNOSIS — R112 Nausea with vomiting, unspecified: Secondary | ICD-10-CM

## 2018-12-10 DIAGNOSIS — E876 Hypokalemia: Secondary | ICD-10-CM | POA: Diagnosis not present

## 2018-12-10 DIAGNOSIS — F17210 Nicotine dependence, cigarettes, uncomplicated: Secondary | ICD-10-CM | POA: Diagnosis present

## 2018-12-10 DIAGNOSIS — M545 Low back pain: Secondary | ICD-10-CM | POA: Diagnosis present

## 2018-12-10 DIAGNOSIS — E861 Hypovolemia: Secondary | ICD-10-CM | POA: Diagnosis present

## 2018-12-10 DIAGNOSIS — Z79899 Other long term (current) drug therapy: Secondary | ICD-10-CM

## 2018-12-10 DIAGNOSIS — Z20822 Contact with and (suspected) exposure to covid-19: Secondary | ICD-10-CM

## 2018-12-10 DIAGNOSIS — N3001 Acute cystitis with hematuria: Secondary | ICD-10-CM

## 2018-12-10 LAB — COMPREHENSIVE METABOLIC PANEL
ALT: 13 U/L (ref 0–44)
AST: 12 U/L — ABNORMAL LOW (ref 15–41)
Albumin: 2.9 g/dL — ABNORMAL LOW (ref 3.5–5.0)
Alkaline Phosphatase: 28 U/L — ABNORMAL LOW (ref 38–126)
Anion gap: 11 (ref 5–15)
BUN: 43 mg/dL — ABNORMAL HIGH (ref 6–20)
CO2: 20 mmol/L — ABNORMAL LOW (ref 22–32)
Calcium: 7.4 mg/dL — ABNORMAL LOW (ref 8.9–10.3)
Chloride: 103 mmol/L (ref 98–111)
Creatinine, Ser: 4.54 mg/dL — ABNORMAL HIGH (ref 0.44–1.00)
GFR calc Af Amer: 12 mL/min — ABNORMAL LOW (ref >60)
GFR calc non Af Amer: 11 mL/min — ABNORMAL LOW (ref >60)
Glucose, Bld: 117 mg/dL — ABNORMAL HIGH (ref 70–99)
Potassium: 3.5 mmol/L (ref 3.5–5.1)
Sodium: 134 mmol/L — ABNORMAL LOW (ref 135–145)
Total Bilirubin: 0.6 mg/dL (ref 0.3–1.2)
Total Protein: 5.5 g/dL — ABNORMAL LOW (ref 6.5–8.1)

## 2018-12-10 LAB — RAPID URINE DRUG SCREEN, HOSP PERFORMED
Amphetamines: NOT DETECTED
Barbiturates: NOT DETECTED
Benzodiazepines: POSITIVE — AB
Cocaine: NOT DETECTED
Opiates: POSITIVE — AB
Tetrahydrocannabinol: NOT DETECTED

## 2018-12-10 LAB — CBC WITH DIFFERENTIAL/PLATELET
Abs Immature Granulocytes: 0.01 10*3/uL (ref 0.00–0.07)
Basophils Absolute: 0 10*3/uL (ref 0.0–0.1)
Basophils Relative: 0 %
Eosinophils Absolute: 0 10*3/uL (ref 0.0–0.5)
Eosinophils Relative: 0 %
HCT: 26.2 % — ABNORMAL LOW (ref 36.0–46.0)
Hemoglobin: 8.7 g/dL — ABNORMAL LOW (ref 12.0–15.0)
Immature Granulocytes: 0 %
Lymphocytes Relative: 9 %
Lymphs Abs: 0.4 10*3/uL — ABNORMAL LOW (ref 0.7–4.0)
MCH: 31.8 pg (ref 26.0–34.0)
MCHC: 33.2 g/dL (ref 30.0–36.0)
MCV: 95.6 fL (ref 80.0–100.0)
Monocytes Absolute: 0.4 10*3/uL (ref 0.1–1.0)
Monocytes Relative: 10 %
Neutro Abs: 3.2 10*3/uL (ref 1.7–7.7)
Neutrophils Relative %: 81 %
Platelets: 72 10*3/uL — ABNORMAL LOW (ref 150–400)
RBC: 2.74 MIL/uL — ABNORMAL LOW (ref 3.87–5.11)
RDW: 13.9 % (ref 11.5–15.5)
WBC: 4 10*3/uL (ref 4.0–10.5)
nRBC: 0 % (ref 0.0–0.2)

## 2018-12-10 LAB — URINALYSIS, ROUTINE W REFLEX MICROSCOPIC
Bilirubin Urine: NEGATIVE
Glucose, UA: NEGATIVE mg/dL
Ketones, ur: NEGATIVE mg/dL
Nitrite: NEGATIVE
Protein, ur: 100 mg/dL — AB
RBC / HPF: >50 RBC/hpf — ABNORMAL HIGH (ref 0–5)
Specific Gravity, Urine: 1.017 (ref 1.005–1.030)
WBC, UA: >50 WBC/hpf — ABNORMAL HIGH (ref 0–5)
pH: 5 (ref 5.0–8.0)

## 2018-12-10 LAB — TYPE AND SCREEN
ABO/RH(D): O POS
Antibody Screen: NEGATIVE

## 2018-12-10 LAB — HIV ANTIBODY (ROUTINE TESTING W REFLEX): HIV Screen 4th Generation wRfx: NONREACTIVE

## 2018-12-10 LAB — LACTIC ACID, PLASMA
Lactic Acid, Venous: 0.9 mmol/L (ref 0.5–1.9)
Lactic Acid, Venous: 2.2 mmol/L (ref 0.5–1.9)
Lactic Acid, Venous: 1.2 mmol/L (ref 0.5–1.9)

## 2018-12-10 LAB — SURGICAL PCR SCREEN
MRSA, PCR: NEGATIVE
Staphylococcus aureus: POSITIVE — AB

## 2018-12-10 LAB — HEMOGLOBIN AND HEMATOCRIT, BLOOD
HCT: 29.8 % — ABNORMAL LOW (ref 36.0–46.0)
Hemoglobin: 9.7 g/dL — ABNORMAL LOW (ref 12.0–15.0)

## 2018-12-10 LAB — POC OCCULT BLOOD, ED: Fecal Occult Bld: NEGATIVE

## 2018-12-10 LAB — SARS CORONAVIRUS 2 BY RT PCR (HOSPITAL ORDER, PERFORMED IN ~~LOC~~ HOSPITAL LAB): SARS Coronavirus 2: NEGATIVE

## 2018-12-10 LAB — LIPASE, BLOOD: Lipase: 14 U/L (ref 11–51)

## 2018-12-10 MED ORDER — ALPRAZOLAM 0.5 MG PO TABS
0.5000 mg | ORAL_TABLET | Freq: Two times a day (BID) | ORAL | Status: DC
  Administered 2018-12-10 – 2018-12-15 (×10): 0.5 mg via ORAL
  Filled 2018-12-10 (×4): qty 1
  Filled 2018-12-10: qty 2
  Filled 2018-12-10 (×6): qty 1

## 2018-12-10 MED ORDER — VANCOMYCIN HCL IN DEXTROSE 1-5 GM/200ML-% IV SOLN
1000.0000 mg | Freq: Once | INTRAVENOUS | Status: AC
  Administered 2018-12-10: 1000 mg via INTRAVENOUS
  Filled 2018-12-10: qty 200

## 2018-12-10 MED ORDER — VARENICLINE TARTRATE 1 MG PO TABS
1.0000 mg | ORAL_TABLET | Freq: Two times a day (BID) | ORAL | Status: DC
  Administered 2018-12-13: 1 mg via ORAL
  Filled 2018-12-10 (×16): qty 1

## 2018-12-10 MED ORDER — SODIUM CHLORIDE 0.9 % IV BOLUS
1000.0000 mL | Freq: Once | INTRAVENOUS | Status: AC
  Administered 2018-12-10: 1000 mL via INTRAVENOUS

## 2018-12-10 MED ORDER — LAMOTRIGINE 100 MG PO TABS
200.0000 mg | ORAL_TABLET | Freq: Every day | ORAL | Status: DC
  Filled 2018-12-10: qty 2

## 2018-12-10 MED ORDER — ACETAMINOPHEN 650 MG RE SUPP: 650.0000 mg | Freq: Four times a day (QID) | RECTAL | Status: DC | PRN

## 2018-12-10 MED ORDER — LORAZEPAM 2 MG/ML IJ SOLN
1.0000 mg | Freq: Once | INTRAMUSCULAR | Status: AC
  Administered 2018-12-10: 1 mg via INTRAVENOUS

## 2018-12-10 MED ORDER — VANCOMYCIN HCL IN DEXTROSE 1-5 GM/200ML-% IV SOLN: 1000.0000 mg | INTRAVENOUS | Status: DC

## 2018-12-10 MED ORDER — SODIUM CHLORIDE 0.9 % IV SOLN
250.0000 mL | INTRAVENOUS | Status: DC
  Administered 2018-12-10: 250 mL via INTRAVENOUS

## 2018-12-10 MED ORDER — ONDANSETRON HCL 4 MG PO TABS
4.0000 mg | ORAL_TABLET | Freq: Four times a day (QID) | ORAL | Status: DC | PRN
  Administered 2018-12-15: 4 mg via ORAL
  Filled 2018-12-10: qty 1

## 2018-12-10 MED ORDER — ONDANSETRON HCL 4 MG/2ML IJ SOLN
4.0000 mg | Freq: Once | INTRAMUSCULAR | Status: AC
  Administered 2018-12-10: 4 mg via INTRAVENOUS
  Filled 2018-12-10: qty 2

## 2018-12-10 MED ORDER — ALBUTEROL SULFATE HFA 108 (90 BASE) MCG/ACT IN AERS
4.0000 | INHALATION_SPRAY | Freq: Once | RESPIRATORY_TRACT | Status: AC
  Administered 2018-12-10: 4 via RESPIRATORY_TRACT
  Filled 2018-12-10: qty 6.7

## 2018-12-10 MED ORDER — ACETAMINOPHEN 325 MG PO TABS
650.0000 mg | ORAL_TABLET | Freq: Four times a day (QID) | ORAL | Status: DC | PRN
  Administered 2018-12-10 – 2018-12-15 (×6): 650 mg via ORAL
  Filled 2018-12-10 (×6): qty 2

## 2018-12-10 MED ORDER — ORAL CARE MOUTH RINSE
15.0000 mL | Freq: Two times a day (BID) | OROMUCOSAL | Status: DC
  Administered 2018-12-10 – 2018-12-14 (×5): 15 mL via OROMUCOSAL

## 2018-12-10 MED ORDER — LORAZEPAM 2 MG/ML IJ SOLN
INTRAMUSCULAR | Status: AC
  Filled 2018-12-10: qty 1

## 2018-12-10 MED ORDER — SERTRALINE HCL 50 MG PO TABS
200.0000 mg | ORAL_TABLET | Freq: Every day | ORAL | Status: DC
  Filled 2018-12-10: qty 4

## 2018-12-10 MED ORDER — HYDROMORPHONE HCL 1 MG/ML IJ SOLN
0.5000 mg | INTRAMUSCULAR | Status: DC | PRN
  Administered 2018-12-10 – 2018-12-14 (×17): 0.5 mg via INTRAVENOUS
  Filled 2018-12-10 (×17): qty 0.5

## 2018-12-10 MED ORDER — PIPERACILLIN-TAZOBACTAM 3.375 G IVPB 30 MIN
3.3750 g | Freq: Once | INTRAVENOUS | Status: AC
  Administered 2018-12-10: 3.375 g via INTRAVENOUS
  Filled 2018-12-10: qty 50

## 2018-12-10 MED ORDER — AEROCHAMBER PLUS FLO-VU MEDIUM MISC
1.0000 | Freq: Once | Status: AC
  Administered 2018-12-10: 1
  Filled 2018-12-10: qty 1

## 2018-12-10 MED ORDER — LEVOFLOXACIN IN D5W 750 MG/150ML IV SOLN
750.0000 mg | Freq: Once | INTRAVENOUS | Status: AC
  Administered 2018-12-10: 10:00:00 750 mg via INTRAVENOUS
  Filled 2018-12-10: qty 150

## 2018-12-10 MED ORDER — CHLORHEXIDINE GLUCONATE CLOTH 2 % EX PADS
6.0000 | MEDICATED_PAD | Freq: Every day | CUTANEOUS | Status: DC
  Administered 2018-12-10 – 2018-12-15 (×6): 6 via TOPICAL

## 2018-12-10 MED ORDER — ONDANSETRON HCL 4 MG/2ML IJ SOLN
4.0000 mg | Freq: Four times a day (QID) | INTRAMUSCULAR | Status: DC | PRN
  Administered 2018-12-12 – 2018-12-14 (×7): 4 mg via INTRAVENOUS
  Filled 2018-12-10 (×7): qty 2

## 2018-12-10 MED ORDER — SODIUM CHLORIDE 0.9 % IV SOLN
INTRAVENOUS | Status: DC
  Administered 2018-12-10 (×2): via INTRAVENOUS

## 2018-12-10 MED ORDER — SODIUM CHLORIDE 0.9 % IV SOLN
INTRAVENOUS | Status: DC | PRN
  Administered 2018-12-14: 12:00:00 via INTRA_ARTERIAL

## 2018-12-10 MED ORDER — MUPIROCIN 2 % EX OINT
1.0000 "application " | TOPICAL_OINTMENT | Freq: Two times a day (BID) | CUTANEOUS | Status: AC
  Administered 2018-12-10 – 2018-12-15 (×10): 1 via NASAL
  Filled 2018-12-10: qty 22

## 2018-12-10 MED ORDER — CHLORHEXIDINE GLUCONATE 0.12 % MT SOLN
15.0000 mL | Freq: Two times a day (BID) | OROMUCOSAL | Status: DC
  Administered 2018-12-10 – 2018-12-14 (×8): 15 mL via OROMUCOSAL
  Filled 2018-12-10 (×7): qty 15

## 2018-12-10 MED ORDER — VANCOMYCIN HCL IN DEXTROSE 750-5 MG/150ML-% IV SOLN
750.0000 mg | Freq: Once | INTRAVENOUS | Status: AC
  Administered 2018-12-10: 750 mg via INTRAVENOUS
  Filled 2018-12-10: qty 150

## 2018-12-10 MED ORDER — PIPERACILLIN-TAZOBACTAM 3.375 G IVPB
3.3750 g | Freq: Two times a day (BID) | INTRAVENOUS | Status: DC
  Administered 2018-12-11: 3.375 g via INTRAVENOUS
  Filled 2018-12-10: qty 50

## 2018-12-10 MED ORDER — LAMOTRIGINE 100 MG PO TABS
200.0000 mg | ORAL_TABLET | Freq: Every day | ORAL | Status: DC
  Administered 2018-12-10 – 2018-12-14 (×5): 200 mg via ORAL
  Filled 2018-12-10 (×5): qty 2

## 2018-12-10 MED ORDER — SERTRALINE HCL 50 MG PO TABS
200.0000 mg | ORAL_TABLET | Freq: Every day | ORAL | Status: DC
  Administered 2018-12-10 – 2018-12-14 (×5): 200 mg via ORAL
  Filled 2018-12-10 (×5): qty 4

## 2018-12-10 MED ORDER — METOCLOPRAMIDE HCL 5 MG/ML IJ SOLN
10.0000 mg | Freq: Once | INTRAMUSCULAR | Status: AC
  Administered 2018-12-10: 10 mg via INTRAVENOUS
  Filled 2018-12-10: qty 2

## 2018-12-10 MED ORDER — DIPHENHYDRAMINE HCL 50 MG/ML IJ SOLN
12.5000 mg | Freq: Once | INTRAMUSCULAR | Status: AC
  Administered 2018-12-10: 07:00:00 12.5 mg via INTRAVENOUS
  Filled 2018-12-10: qty 1

## 2018-12-10 MED ORDER — PANTOPRAZOLE SODIUM 40 MG IV SOLR
40.0000 mg | Freq: Two times a day (BID) | INTRAVENOUS | Status: DC
  Administered 2018-12-10 – 2018-12-13 (×7): 40 mg via INTRAVENOUS
  Filled 2018-12-10 (×7): qty 40

## 2018-12-10 MED ORDER — NOREPINEPHRINE 4 MG/250ML-% IV SOLN
0.0000 ug/min | INTRAVENOUS | Status: DC
  Administered 2018-12-10: 2 ug/min via INTRAVENOUS
  Administered 2018-12-10: 8 ug/min via INTRAVENOUS
  Administered 2018-12-11 (×2): 6 ug/min via INTRAVENOUS
  Administered 2018-12-12: 3 ug/min via INTRAVENOUS
  Administered 2018-12-13: 08:00:00 4 ug/min via INTRAVENOUS
  Filled 2018-12-10 (×6): qty 250

## 2018-12-10 NOTE — ED Notes (Signed)
ekg given to dr. Tomi Bamberger

## 2018-12-10 NOTE — ED Provider Notes (Signed)
Pt signed out by Dr. Tomi Bamberger at shift change.  Repeat BP now in the 80s.  She is still tachycardic.  I ordered another IV and an additional 1L.  Hgb has dropped from 14.2 (10/5) to 8.7.  I performed a stool guaiac which was negative, but there was no stool on my finger.  Pt has a hx of Crohns, but no diarrhea.  Pt did have a cough.  As pt was waiting for her CT, she became more agitated and pulled out her IVs and monitor.  She tried to leave.  I convinced her to stay.  She sounded more sob than she did before the fluid.  The pt was given an albuterol inhaler and CXR shows some developing pulmonary edema.  Pt's IVFs were stopped.  We changed her Covid test to a rapid test and RT put pt on bipap.  Covid negative.  The bipap has helped her significantly.  Pt did have her CT scan which shows a multifocal pneumonia and pulmonary edema.  The pt also has a UTI.  She was given levaquin as she is allergic to cephalexin.    Pt's bp is still low, so levophed was started.  Pt needed a central line.  She requested that I try first in her leg.  So, a TLC was placed in her right femoral vein. Pt tolerated that well.  [name] [name] was evaluated in Emergency Department on 12/10/2018 for the symptoms described in the history of present illness. She was evaluated in the context of the global COVID-19 pandemic, which necessitated consideration that the patient might be at risk for infection with the SARS-CoV-2 virus that causes COVID-19. Institutional protocols and algorithms that pertain to the evaluation of patients at risk for COVID-19 are in a state of rapid change based on information released by regulatory bodies including the CDC and federal and state organizations. These policies and algorithms were followed during the patient's care in the ED.  CRITICAL CARE Performed by: Isla Pence   Total critical care time: 75 minutes  Critical care time was exclusive of separately billable procedures and treating other  patients.  Critical care was necessary to treat or prevent imminent or life-threatening deterioration.  Critical care was time spent personally by me on the following activities: development of treatment plan with patient and/or surrogate as well as nursing, discussions with consultants, evaluation of patient's response to treatment, examination of patient, obtaining history from patient or surrogate, ordering and performing treatments and interventions, ordering and review of laboratory studies, ordering and review of radiographic studies, pulse oximetry and re-evaluation of patient's condition.  Pt d/w Dr. Manuella Ghazi (triad) for admission.   Isla Pence, MD 12/10/18 [phone]

## 2018-12-10 NOTE — Progress Notes (Signed)
Called to pt's room for alarming BIPAP. Arrived to find pt pulling at the mask and trying to place a paper towel inside the mask. I removed the paper towel and readjusted the mask for a good seal. I explained the importance of leaving the mask in place and allowing the BIPAP to do it's job. Pt's HR is in the mid 130's and RR's in the 40's. Pt states she will leave the mask alone. RN aware

## 2018-12-10 NOTE — Progress Notes (Signed)
Patient off BIPAP on 4LNC. Breathsounds are diminshed. Patient on 4LHFNC with 02 saturations at 96%. In no distress. BIPAP on standby at bedside if needed.

## 2018-12-10 NOTE — ED Notes (Signed)
Patient transported to CT 

## 2018-12-10 NOTE — ED Triage Notes (Signed)
Pt states she has had n/v x 4 days; pt also c/o headache

## 2018-12-10 NOTE — ED Notes (Signed)
CRITICAL VALUE ALERT  Critical Value:  Lactic 2.2  Date & Time Notied:  12/10/2018, 0820  Provider Notified: Dr. Gilford Raid  Orders Received/Actions taken: see chart

## 2018-12-10 NOTE — ED Notes (Signed)
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

## 2018-12-10 NOTE — ED Provider Notes (Signed)
[facility] EMERGENCY DEPARTMENT Provider Note   CSN: [id number] Arrival date & time: 12/10/18  0459   Time seen 5:30 AM  History   Chief Complaint Chief Complaint  Patient presents with  . Emesis    HPI [name] [name] is a 48 y.o. female.     HPI patient states on October 17 around 4 PM she started having nausea and vomiting without diarrhea.  She states she had eaten some stew from a fire department earlier in the day.  She states she has been vomiting daily since then.  She vomited 4 times yesterday and started vomiting again at 3 AM this morning.  She complains of feeling dizzy, weak, and lightheaded.  She states she is having urinary output.  She also thinks she passed a kidney stone last week.  She did have a headache but it is gone now but she states her eyes hurt when she vomits.  She states she changed her glasses and turned off her computer because of her eye pain.  She thinks she may have had Covid in March and states she had a respiratory infection in May and within 10 seconds of them starting an IV she felt better.  She states she has not run out of her chronic pain medication.  PCP Plotnikov, Evie Lacks, MD   Past Medical History:  Diagnosis Date  . Anxiety   . Arthritis    Dr. Eddie Dibbles  . Bartholin cyst 2008   vag.  [name] BIPOLAR AFFECTIVE DISORDER 04/14/2007  . 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  [name] Depression   . Elevated glucose 2010  . GERD (gastroesophageal reflux disease)   . Hypertension   . Kidney stone   . LBP (low back pain)    Dr. Mina Marble  . Perianal abscess 2009  . Vitamin B12 deficiency     Patient Active Problem List   Diagnosis Date Noted  . Renal artery stenosis ([place]) 08/14/2018  . Constipation 06/04/2018  . Pyelonephritis 08/02/2016  . Heart murmur, systolic [id number]  . Crohn's disease of ileum with fistula ([place]) 03/09/2015  . Acute sinusitis 05/31/2014  . Abnormal  uterine bleeding (AUB) 04/12/2014  . Essential hypertension, benign 09/02/2012  . Elevated BP 04/28/2012  . UTI 01/16/2010  . EDEMA 01/16/2010  . Obesity 11/15/2009  . SOMNOLENCE 11/15/2009  . CYSTITIS, ACUTE, RECURRENT 07/13/2009  . WEIGHT GAIN, ABNORMAL 07/13/2009  . HYPERGLYCEMIA 01/24/2009  . ALOPECIA NOS 09/30/2007  . CELLULITIS/ABSCESS, LEG 07/23/2007  . RASH AND OTHER NONSPECIFIC SKIN ERUPTION 07/23/2007  . Vaginitis and vulvovaginitis 05/12/2007  . 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 COLONIC POLYPS 04/14/2007  . B12 nutritional deficiency 04/14/2007  . BIPOLAR AFFECTIVE DISORDER 04/14/2007  . TOBACCO USER 04/14/2007  . Depression 04/14/2007  . ADHD 04/14/2007  . Migraine 04/14/2007  . CARPAL TUNNEL SYNDROME, BILATERAL 04/14/2007  . BRONCHITIS 04/14/2007  . HYDRONEPHROSIS, LEFT 04/14/2007  . Calculus of kidney 04/14/2007  . ARTHRITIS 04/14/2007  . ARTHRALGIA 04/14/2007  . Chronic fatigue 04/14/2007  . OTHER AND UNSPECIFIED HYPERLIPIDEMIA 03/17/2007  . ANGULAR CHEILITIS 01/21/2007  . Anxiety 11/27/2006  . GERD 11/27/2006  . Enlargement of lymph nodes 11/27/2006  . LOW BACK PAIN, CHRONIC 09/23/2006    Past Surgical History:  Procedure Laterality Date  . COLONOSCOPY  09/2008   Dr. Derrill Kay: ulcers, edema, possible fistula openings all seen in the distal 3 cm of anus/anal canal. 1cm pseudopolyp. rest of colon and  TI normal. rectal biopsy with mild chronic active colitis. ileum bx normal.  . COLONOSCOPY WITH PROPOFOL N/A 03/29/2015   SLF: Severe proctocolitis limitied to cecum and rectum. Limited exam of the colon mucosa and anal canal.   . ELBOW SURGERY     left  . ESOPHAGOGASTRODUODENOSCOPY (EGD) WITH PROPOFOL N/A 03/29/2015   SLF: 1. Erosive gastritis duodentitis.   [name] RECTOVAGINAL FISTULA CLOSURE     did not help     OB History    Gravida  0   Para  0   Term  0   Preterm  0   AB  0   Living  0     SAB  0   TAB  0   Ectopic  0   Multiple  0   Live  Births  0            Home Medications    Prior to Admission medications   Medication Sig Start Date End Date Taking? Authorizing Provider  ALPRAZolam Duanne Moron) 0.5 MG tablet Take 1 tablet (0.5 mg total) by mouth at bedtime as needed. Patient taking differently: Take 0.5 mg by mouth 2 (two) times daily.  03/03/12   Plotnikov, Evie Lacks, MD  benazepril-hydrochlorthiazide (LOTENSIN HCT) 20-12.5 MG tablet Take 1 tablet by mouth daily. 04/07/18   Plotnikov, Evie Lacks, MD  Cholecalciferol (VITAMIN D3) 250 MCG (10000 UT) capsule Take 10,000 Units by mouth daily.    [provider]  ciprofloxacin (CIPRO) 500 MG tablet Take 1 tablet (500 mg total) by mouth 2 (two) times daily. 05/09/18   Estill Dooms, NP  cyanocobalamin (,VITAMIN B-12,) 1000 MCG/ML injection INJECT 1ML INTO THE SKIN EVERY 14 DAYS Patient taking differently: Every 10 days 05/18/18   Plotnikov, Evie Lacks, MD  cyclobenzaprine (FLEXERIL) 10 MG tablet Take 1 tablet (10 mg total) by mouth 3 (three) times daily as needed for muscle spasms. 02/24/18   Plotnikov, Evie Lacks, MD  cyclobenzaprine (FLEXERIL) 5 MG tablet TAKE 1 TABLET (5 MG TOTAL) BY MOUTH AT BEDTIME AS NEEDED FOR MUSCLE SPASMS. 10/06/18   Plotnikov, Evie Lacks, MD  diclofenac sodium (VOLTAREN) 1 % GEL Apply 1 application topically 4 (four) times daily. 08/25/18   Plotnikov, Evie Lacks, MD  fluconazole (DIFLUCAN) 150 MG tablet Take 1 tablet (150 mg total) by mouth every three (3) days as needed. 07/05/18   Jonnie Kind, MD  HYDROcodone-acetaminophen (NORCO) 10-325 MG tablet Take 1 tablet by mouth every 8 (eight) hours as needed for severe pain. 11/24/18   Plotnikov, Evie Lacks, MD  ketorolac (TORADOL) 10 MG tablet Take 1 tablet (10 mg total) by mouth every 6 (six) hours as needed. for pain 08/12/18   Plotnikov, Evie Lacks, MD  lamoTRIgine (LAMICTAL) 200 MG tablet Take 1 tablet by mouth daily. 02/03/15   [provider]  norethindrone (AYGESTIN) 5 MG tablet Take 1  tablet (5 mg total) by mouth daily. 01/09/18   Jonnie Kind, MD  ondansetron (ZOFRAN) 4 MG tablet Take 1 tablet (4 mg total) by mouth every 8 (eight) hours as needed for nausea or vomiting. 05/09/18   Estill Dooms, NP  pantoprazole (PROTONIX) 40 MG tablet 1 PO 30 MINUTES PRIOR TO YOUR FIRST MEAL QD 04/09/18   Fields, Marga Melnick, MD  potassium chloride (K-DUR) 10 MEQ tablet TAKE 1 TABLET BY MOUTH EVERY DAY 08/21/18   Plotnikov, Evie Lacks, MD  promethazine (PHENERGAN) 25 MG tablet Take 1 tablet (25 mg total) by mouth 2 (two)  times daily as needed for nausea. 05/26/18   Plotnikov, Evie Lacks, MD  sertraline (ZOLOFT) 100 MG tablet Take 200 mg by mouth daily.     [provider]  sodium chloride 0.9 % SOLN 250 mL with vedolizumab 300 MG SOLR 300 mg Inject 300 mg into the vein. LOADING DOSE THEN Q8 WEEKS 06/20/15   [provider]  SYRINGE-NEEDLE, DISP, 3 ML (B-D SYRINGE/NEEDLE 3CC/25GX5/8) 25G X 5/8" 3 ML MISC Use to administer B12 injection every 14 days 06/27/18   Plotnikov, Evie Lacks, MD  varenicline (CHANTIX CONTINUING MONTH PAK) 1 MG tablet Take 1 tablet (1 mg total) by mouth 2 (two) times daily. 08/25/18   Plotnikov, Evie Lacks, MD  varenicline (CHANTIX STARTING MONTH PAK) 0.5 MG X 11 & 1 MG X 42 tablet Take one 0.5 mg tablet by mouth once daily for 3 days, then increase to one 0.5 mg tablet twice daily for 4 days, then increase to one 1 mg tablet twice daily. 08/25/18   Plotnikov, Evie Lacks, MD  vedolizumab (ENTYVIO) 300 MG injection Inject into the vein. Every 8 weeks    [provider]    Family History Family History  Problem Relation Age of Onset  . Diabetes Mother   . Heart disease Mother   . Heart attack Mother   . Cancer Maternal Aunt        ovarian  . Breast cancer Maternal Grandmother   . Early death Maternal Grandmother 76  . Cancer Maternal Grandmother        breast  . Heart attack Father   . Heart disease Father   . Cancer Maternal Grandfather        colon     Social History Social History   Tobacco Use  . Smoking status: Current Every Day Smoker    Packs/day: 0.50    Years: 25.00    Pack years: 12.50    Types: Cigarettes  . Smokeless tobacco: Never Used  Substance Use Topics  . Alcohol use: No  . Drug use: No     Allergies   Cephalexin, Codeine, Guaifenesin, Imitrex [sumatriptan], Morphine, Remicade [infliximab], Sulfonamide derivatives, and Ultram [tramadol hcl]   Review of Systems Review of Systems  All other systems reviewed and are negative.    Physical Exam Updated Vital Signs BP (!) 80/51 (BP Location: Left Arm)   Pulse (!) 123   Temp 98.2 F (36.8 C) (Oral)   Resp 18   Ht 5\' 2"  (1.575 m)   Wt 70.3 kg   SpO2 100%   BMI 28.35 kg/m   Vital signs normal except for tachycardia and hypotension   Physical Exam Vitals signs and nursing note reviewed.  Constitutional:      General: She is not in acute distress.    Appearance: She is not ill-appearing.  HENT:     Head: Normocephalic and atraumatic.     Right Ear: External ear normal.     Left Ear: External ear normal.     Nose: Nose normal.     Mouth/Throat:     Mouth: Mucous membranes are dry.     Comments: Tongue has nicotine stains on it, she states she quit smoking 2 weeks ago Eyes:     Extraocular Movements: Extraocular movements intact.     Conjunctiva/sclera: Conjunctivae normal.     Pupils: Pupils are equal, round, and reactive to light.  Neck:     Musculoskeletal: Normal range of motion and neck supple.  Cardiovascular:  Rate and Rhythm: Regular rhythm. Tachycardia present.  Pulmonary:     Effort: Pulmonary effort is normal. No respiratory distress.     Breath sounds: Normal breath sounds.  Abdominal:     General: Abdomen is flat. Bowel sounds are normal.     Palpations: Abdomen is soft.     Tenderness: There is no abdominal tenderness.  Musculoskeletal: Normal range of motion.  Skin:    General: Skin is warm and dry.  Neurological:      General: No focal deficit present.     Mental Status: She is alert and oriented to person, place, and time.     Cranial Nerves: No cranial nerve deficit.  Psychiatric:        Mood and Affect: Mood normal.        Behavior: Behavior normal.        Thought Content: Thought content normal.      ED Treatments / Results  Labs (all labs ordered are listed, but only abnormal results are displayed) Results for orders placed or performed during the hospital encounter of 12/10/18  Comprehensive metabolic panel  Result Value Ref Range   Sodium 134 (L) 135 - 145 mmol/L   Potassium 3.5 3.5 - 5.1 mmol/L   Chloride 103 98 - 111 mmol/L   CO2 20 (L) 22 - 32 mmol/L   Glucose, Bld 117 (H) 70 - 99 mg/dL   BUN 43 (H) 6 - 20 mg/dL   Creatinine, Ser 4.54 (H) 0.44 - 1.00 mg/dL   Calcium 7.4 (L) 8.9 - 10.3 mg/dL   Total Protein 5.5 (L) 6.5 - 8.1 g/dL   Albumin 2.9 (L) 3.5 - 5.0 g/dL   AST 12 (L) 15 - 41 U/L   ALT 13 0 - 44 U/L   Alkaline Phosphatase 28 (L) 38 - 126 U/L   Total Bilirubin 0.6 0.3 - 1.2 mg/dL   GFR calc non Af Amer 11 (L) >60 mL/min   GFR calc Af Amer 12 (L) >60 mL/min   Anion gap 11 5 - 15  CBC with Differential  Result Value Ref Range   WBC 4.0 4.0 - 10.5 K/uL   RBC 2.74 (L) 3.87 - 5.11 MIL/uL   Hemoglobin 8.7 (L) 12.0 - 15.0 g/dL   HCT 26.2 (L) 36.0 - 46.0 %   MCV 95.6 80.0 - 100.0 fL   MCH 31.8 26.0 - 34.0 pg   MCHC 33.2 30.0 - 36.0 g/dL   RDW 13.9 11.5 - 15.5 %   Platelets 72 (L) 150 - 400 K/uL   nRBC 0.0 0.0 - 0.2 %   Neutrophils Relative % 81 %   Neutro Abs 3.2 1.7 - 7.7 K/uL   Lymphocytes Relative 9 %   Lymphs Abs 0.4 (L) 0.7 - 4.0 K/uL   Monocytes Relative 10 %   Monocytes Absolute 0.4 0.1 - 1.0 K/uL   Eosinophils Relative 0 %   Eosinophils Absolute 0.0 0.0 - 0.5 K/uL   Basophils Relative 0 %   Basophils Absolute 0.0 0.0 - 0.1 K/uL   WBC Morphology FEW BANDS    Immature Granulocytes 0 %   Abs Immature Granulocytes 0.01 0.00 - 0.07 K/uL  Lipase, blood   Result Value Ref Range   Lipase 14 11 - 51 U/L  Lactic acid, plasma  Result Value Ref Range   Lactic Acid, Venous 0.9 0.5 - 1.9 mmol/L   Laboratory interpretation all normal except new renal insufficiency, new anemia, malnutrition    EKG EKG Interpretation  Date/Time:  Wednesday December 10 2018 06:00:04 EDT Ventricular Rate:  108 PR Interval:    QRS Duration: 87 QT Interval:  336 QTC Calculation: 451 R Axis:   57 Text Interpretation:  Age not entered, assumed to be  48 years old for purpose of ECG interpretation Sinus tachycardia No significant change since last tracing 25 Mar 2015 Confirmed by Rolland Porter [phone]) on 12/10/2018 6:22:15 AM   Radiology No results found.  Procedures .Critical Care Performed by: Rolland Porter, MD Authorized by: Rolland Porter, MD   Critical care provider statement:    Critical care time (minutes):  38   Critical care was necessary to treat or prevent imminent or life-threatening deterioration of the following conditions:  Circulatory failure   Critical care was time spent personally by me on the following activities:  Examination of patient, obtaining history from patient or surrogate, ordering and review of laboratory studies, pulse oximetry and re-evaluation of patient's condition   (including critical care time)  Medications Ordered in ED Medications  sodium chloride 0.9 % bolus 1,000 mL (1,000 mLs Intravenous New Bag/Given 12/10/18 0551)  sodium chloride 0.9 % bolus 1,000 mL (1,000 mLs Intravenous New Bag/Given 12/10/18 0551)  ondansetron (ZOFRAN) injection 4 mg (4 mg Intravenous Given 12/10/18 0655)  metoCLOPramide (REGLAN) injection 10 mg (10 mg Intravenous Given 12/10/18 0655)  diphenhydrAMINE (BENADRYL) injection 12.5 mg (12.5 mg Intravenous Given 12/10/18 0655)     Initial Impression / Assessment and Plan / ED Course  I have reviewed the triage vital signs and the nursing notes.  Pertinent labs & imaging results that were available  during my care of the patient were reviewed by me and considered in my medical decision making (see chart for details).       Patient was given IV fluids, laboratory testing was done.  She was given IV Zofran and for her complaints of eye pain she was given Reglan and Benadryl.  Recheck at 7:05 AM on the monitor patient's blood pressure is 60 but patient is awake and alert.  We discussed that her blood work shows that she is severely dehydrated and she will need to be admitted.  Dr. Gilford Raid will speak to the admitting doctors.  Review of the [place] shows she gets #90 hydrocodone 10/325 monthly, last filled September 18.  Of note patient states she only takes half a pill twice a day.  Final Clinical Impressions(s) / ED Diagnoses   Final diagnoses:  Nausea and vomiting, intractability of vomiting not specified, unspecified vomiting type  Dehydration  Hypotension due to hypovolemia  Acute renal injury ([place])  Anemia, unspecified type    Plan admission  Rolland Porter, MD, Abram SanderRolland Porter, MD 12/10/18 [phone]

## 2018-12-10 NOTE — Progress Notes (Signed)
1500: Verified with Dr. Manuella Ghazi okay to give meds and clear liquids.  1540: Upon removing BiPAP and giving patient a sip of water, she began coughing profusely. She was coughing up a large amount of sputum. She also complained of 10/10 pain behind her eyes when coughing. I held the PO meds at the time for risk of aspiration. HR was in the 140s at this time as well.   1549: PRN Dilaudid given for pain. Dr. Manuella Ghazi notified of the event. Will continue to monitor.

## 2018-12-10 NOTE — ED Provider Notes (Signed)
  Physical Exam  BP (!) 94/53   Pulse (!) 138   Temp 98.2 F (36.8 C) (Oral)   Resp (!) 31   Ht 5' 2"  (1.575 m)   Wt 70.3 kg   SpO2 95%   BMI 28.35 kg/m   Physical Exam  ED Course/Procedures     .Central Line  Date/Time: 12/10/2018 12:03 PM Performed by: Isla Pence, MD Authorized by: Isla Pence, MD   Consent:    Consent obtained:  Verbal   Consent given by:  Patient   Risks discussed:  Arterial puncture, incorrect placement, bleeding and infection   Alternatives discussed:  No treatment Pre-procedure details:    Hand hygiene: Hand hygiene performed prior to insertion     Sterile barrier technique: All elements of maximal sterile technique followed     Skin preparation:  2% chlorhexidine   Skin preparation agent: Skin preparation agent completely dried prior to procedure   Anesthesia (see MAR for exact dosages):    Anesthesia method:  Local infiltration   Local anesthetic:  Lidocaine 1% w/o epi Procedure details:    Location:  R femoral   Site selection rationale:  Pt request   Patient position:  Flat   Procedural supplies:  Triple lumen   Catheter size:  7.5 Fr   Landmarks identified: yes     Ultrasound guidance: no     Number of attempts:  1   Successful placement: yes   Post-procedure details:    Post-procedure:  Dressing applied and line sutured   Assessment:  Blood return through all ports and free fluid flow   Patient tolerance of procedure:  Tolerated well, no immediate complications    MDM        Isla Pence, MD 12/10/18 1204

## 2018-12-10 NOTE — H&P (Addendum)
History and Physical    [name] [name] DOB: 22-Mar-1970 DOA: 12/10/2018  PCP: Cassandria Anger, MD   Patient coming from: Home  Chief Complaint: Nausea/vomiting  HPI: [name] [name] is a 48 y.o. female with medical history significant for bipolar affective disorder, depression/anxiety, hypertension, GERD, and Crohn's disease who began having nausea and vomiting that started on 10/17.  She has been vomiting daily and started to feel weak, dizzy, and lightheaded.  She feels as though she passed a kidney stone last week as well.  She denies any chest pain, shortness of breath, fevers or chills.  She has been taking her medications otherwise as usual.  She denies any coughing.   ED Course: Patient is noted to be hypotensive and tachycardic and was given 3 L of IV fluid bolus.  Her blood pressures did not demonstrate much improvement and therefore I had asked EDP to place central line which was placed in the right femoral region and Levophed has now been initiated with improvement in blood pressure readings noted.  She remains alert and awake and started become short of breath after fluids were administered and therefore she was placed on BiPAP with improvement in her respiratory status noted.  She is currently on FiO2 55%.  CT of the abdomen and pelvis is notable for multifocal pneumonia and patient was initially given IV Levaquin which I have changed to Zosyn and vancomycin for now.  Her lactic acid was 2.2.  Hemoglobin is 8.7 and fecal occult testing is negative.  Platelet count is 72,000.  BUN is 43 and creatinine is 4.54 with prior baseline 1.3-1.5.  EKG notable for sinus tachycardia.  Covid testing is negative.  Review of Systems: All others reviewed as noted above and otherwise negative.  Past Medical History:  Diagnosis Date   Anxiety    Arthritis    Dr. Eddie Dibbles   Bartholin cyst 2008   vag.   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   Depression    Elevated glucose 2010   GERD (gastroesophageal reflux disease)    Hypertension    Kidney stone    LBP (low back pain)    Dr. Mina Marble   Perianal abscess 2009   Vitamin B12 deficiency     Past Surgical History:  Procedure Laterality Date   COLONOSCOPY  09/2008   Dr. Derrill Kay: ulcers, edema, possible fistula openings all seen in the distal 3 cm of anus/anal canal. 1cm pseudopolyp. rest of colon and TI normal. rectal biopsy with mild chronic active colitis. ileum bx normal.   COLONOSCOPY WITH PROPOFOL N/A 03/29/2015   SLF: Severe proctocolitis limitied to cecum and rectum. Limited exam of the colon mucosa and anal canal.    ELBOW SURGERY     left   ESOPHAGOGASTRODUODENOSCOPY (EGD) WITH PROPOFOL N/A 03/29/2015   SLF: 1. Erosive gastritis duodentitis.    RECTOVAGINAL FISTULA CLOSURE     did not help     reports that she has been smoking cigarettes. She has a 12.50 pack-year smoking history. She has never used smokeless tobacco. She reports that she does not drink alcohol or use drugs.  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 Authorizing Provider  ALPRAZolam Duanne Moron) 0.5 MG tablet Take 1 tablet (0.5 mg total) by mouth at bedtime as needed. Patient taking differently: Take 0.5 mg by mouth 2 (two) times daily.  03/03/12   Plotnikov, Evie Lacks, MD  benazepril-hydrochlorthiazide (LOTENSIN HCT) 20-12.5 MG tablet Take 1 tablet by mouth daily. 04/07/18   Plotnikov, Evie Lacks, MD  Cholecalciferol (VITAMIN D3) 250 MCG (10000 UT) capsule Take 10,000 Units by mouth daily.    [provider]  ciprofloxacin (CIPRO) 500 MG tablet Take 1 tablet (500 mg total) by mouth 2 (two) times daily. 05/09/18   Estill Dooms, NP  cyanocobalamin (,VITAMIN B-12,) 1000 MCG/ML injection INJECT 1ML INTO THE SKIN EVERY 14 DAYS Patient taking differently: Every 10 days 05/18/18   Plotnikov, Evie Lacks, MD  cyclobenzaprine (FLEXERIL) 10 MG tablet Take 1 tablet (10 mg total) by mouth 3 (three) times daily as needed for muscle spasms. 02/24/18   Plotnikov, Evie Lacks, MD  cyclobenzaprine (FLEXERIL) 5 MG tablet TAKE 1 TABLET (5 MG TOTAL) BY MOUTH AT BEDTIME AS NEEDED FOR MUSCLE SPASMS. 10/06/18   Plotnikov, Evie Lacks, MD  diclofenac sodium (VOLTAREN) 1 % GEL Apply 1 application topically 4 (four) times daily. 08/25/18   Plotnikov, Evie Lacks, MD  fluconazole (DIFLUCAN) 150 MG tablet Take 1 tablet (150 mg total) by mouth every three (3) days as needed. 07/05/18   Jonnie Kind, MD  HYDROcodone-acetaminophen (NORCO) 10-325 MG tablet Take 1 tablet by mouth every 8 (eight) hours as needed for severe pain. 11/24/18   Plotnikov, Evie Lacks, MD  ketorolac (TORADOL) 10 MG tablet Take 1 tablet (10 mg total) by mouth every 6 (six) hours as needed. for pain 08/12/18   Plotnikov, Evie Lacks, MD  lamoTRIgine (LAMICTAL) 200 MG tablet Take 1 tablet by mouth daily. 02/03/15   [provider]  norethindrone (AYGESTIN) 5 MG tablet Take 1 tablet (5 mg total) by mouth daily. 01/09/18   Jonnie Kind, MD  ondansetron (ZOFRAN) 4 MG tablet Take 1 tablet (4 mg  total) by mouth every 8 (eight) hours as needed for nausea or vomiting. 05/09/18   Estill Dooms, NP  pantoprazole (PROTONIX) 40 MG tablet 1 PO 30 MINUTES PRIOR TO YOUR FIRST MEAL QD 04/09/18   Fields, Marga Melnick, MD  potassium chloride (K-DUR) 10 MEQ tablet TAKE 1 TABLET BY MOUTH EVERY DAY 08/21/18   Plotnikov, Evie Lacks, MD  promethazine (PHENERGAN) 25 MG tablet Take 1 tablet (25 mg total) by mouth 2 (two) times daily as needed for nausea. 05/26/18   Plotnikov, Evie Lacks, MD  sertraline (ZOLOFT) 100 MG tablet Take 200 mg by mouth daily.     [provider]  sodium chloride 0.9 % SOLN 250 mL with vedolizumab 300 MG SOLR 300 mg Inject 300 mg into the vein. LOADING DOSE THEN Q8 WEEKS 06/20/15   [provider]  SYRINGE-NEEDLE, DISP, 3 ML (B-D SYRINGE/NEEDLE 3CC/25GX5/8) 25G X 5/8" 3 ML MISC Use to administer B12 injection every 14 days 06/27/18   Plotnikov, Evie Lacks, MD  varenicline (CHANTIX CONTINUING MONTH PAK) 1 MG tablet Take 1 tablet (1 mg total) by mouth 2 (two) times daily. 08/25/18   Plotnikov, Evie Lacks, MD  varenicline (CHANTIX STARTING MONTH PAK) 0.5 MG X 11 & 1 MG X 42 tablet Take one 0.5 mg tablet by mouth once daily for 3 days, then increase to one 0.5 mg tablet twice daily for 4 days, then increase to one 1 mg tablet twice daily. 08/25/18   Plotnikov, Evie Lacks, MD  vedolizumab (ENTYVIO) 300 MG injection Inject into the vein. Every 8 weeks    [provider]    Physical Exam: Vitals:   12/10/18 1249 12/10/18 1250 12/10/18 1251 12/10/18 1252  BP:  103/63  102/62  Pulse: (!) 102 (!) 101 (!) 102 (!) 102  Resp: (!) 29 (!) 28 (!) 34 (!) 34  Temp:      TempSrc:      SpO2: 100% 100% 100% 100%  Weight:      Height:        Constitutional: NAD, calm, comfortable Vitals:   12/10/18 1249 12/10/18 1250 12/10/18 1251 12/10/18 1252  BP:  103/63  102/62  Pulse: (!) 102 (!) 101 (!) 102 (!) 102  Resp: (!) 29 (!) 28 (!) 34 (!) 34  Temp:      TempSrc:      SpO2: 100% 100%  100% 100%  Weight:      Height:       Eyes: lids and conjunctivae normal ENMT: Mucous membranes are moist.  Neck: normal, supple Respiratory: clear to auscultation bilaterally. Normal respiratory effort. No accessory muscle use.  Currently on BiPAP 55% FiO2. Cardiovascular: Regular rate and rhythm, no murmurs. No extremity edema. Abdomen: no tenderness, no distention. Bowel sounds positive.  Musculoskeletal:  No joint deformity upper and lower extremities.   Skin: no rashes, lesions, ulcers.  Psychiatric: Normal judgment and insight. Alert and oriented x 3. Normal mood.   Labs on Admission: I have personally reviewed following labs and imaging studies  CBC: Recent Labs  Lab 12/10/18 0619  WBC 4.0  NEUTROABS 3.2  HGB 8.7*  HCT 26.2*  MCV 95.6  PLT 72*   Basic Metabolic Panel: Recent Labs  Lab 12/10/18 0619  NA 134*  K 3.5  CL 103  CO2 20*  GLUCOSE 117*  BUN 43*  CREATININE 4.54*  CALCIUM 7.4*   GFR: Estimated Creatinine Clearance: 13.8 mL/min (A) (by C-G formula based on SCr of 4.54 mg/dL (H)). Liver Function Tests: Recent Labs  Lab 12/10/18 0619  AST 12*  ALT 13  ALKPHOS 28*  BILITOT 0.6  PROT 5.5*  ALBUMIN 2.9*   Recent Labs  Lab 12/10/18 0619  LIPASE 14   No results for input(s): AMMONIA in the last 168 hours. Coagulation Profile: No results for input(s): INR, PROTIME in the last 168 hours. Cardiac Enzymes: No results for input(s): CKTOTAL, CKMB, CKMBINDEX, TROPONINI in the last 168 hours. BNP (last 3 results) Recent Labs    08/25/18 0844  PROBNP 70.0   HbA1C: No results for input(s): HGBA1C in the last 72 hours. CBG: No results for input(s): GLUCAP in  the last 168 hours. Lipid Profile: No results for input(s): CHOL, HDL, LDLCALC, TRIG, CHOLHDL, LDLDIRECT in the last 72 hours. Thyroid Function Tests: No results for input(s): TSH, T4TOTAL, FREET4, T3FREE, THYROIDAB in the last 72 hours. Anemia Panel: No results for input(s): VITAMINB12,  FOLATE, FERRITIN, TIBC, IRON, RETICCTPCT in the last 72 hours. Urine analysis:    Component Value Date/Time   COLORURINE AMBER (A) 12/10/2018 0832   APPEARANCEUR TURBID (A) 12/10/2018 0832   LABSPEC 1.017 12/10/2018 0832   PHURINE 5.0 12/10/2018 0832   GLUCOSEU NEGATIVE 12/10/2018 0832   GLUCOSEU NEGATIVE 05/28/2016 0828   HGBUR MODERATE (A) 12/10/2018 0832   BILIRUBINUR NEGATIVE 12/10/2018 0832   KETONESUR NEGATIVE 12/10/2018 0832   PROTEINUR 100 (A) 12/10/2018 0832   UROBILINOGEN 0.2 05/28/2016 0828   NITRITE NEGATIVE 12/10/2018 0832   LEUKOCYTESUR LARGE (A) 12/10/2018 0832    Radiological Exams on Admission: Ct Abdomen Pelvis Wo Contrast  Result Date: 12/10/2018 CLINICAL DATA:  48 year old female with weakness dizziness. History of kidney stones. History of Crohn's disease and rectovaginal fistula. EXAM: CT CHEST, ABDOMEN AND PELVIS WITHOUT CONTRAST TECHNIQUE: Multidetector CT imaging of the chest, abdomen and pelvis was performed following the standard protocol without IV contrast. COMPARISON:  Chest radiograph dated 12/10/2018 and CT of the abdomen pelvis dated 07/23/2018. FINDINGS: Evaluation of this exam is limited in the absence of intravenous contrast. CT CHEST FINDINGS Cardiovascular: There is no cardiomegaly or pericardial effusion. The thoracic aorta and the central pulmonary arteries are unremarkable. Mediastinum/Nodes: No hilar or mediastinal adenopathy. The esophagus and the thyroid gland are grossly unremarkable. No mediastinal fluid collection. Lungs/Pleura: There is diffuse interstitial and interlobular septal thickening consistent with edema. Bilateral, upper lobe predominant, patchy airspace opacities most consistent with multifocal pneumonia. Trace bilateral pleural effusions noted. There is no pneumothorax. The central airways are patent. Musculoskeletal: No chest wall mass or suspicious bone lesions identified. CT ABDOMEN PELVIS FINDINGS No intra-abdominal free air or  free fluid. Hepatobiliary: The liver is unremarkable. No intrahepatic biliary ductal dilatation. The gallbladder is unremarkable. Pancreas: Unremarkable. No pancreatic ductal dilatation or surrounding inflammatory changes. Spleen: Splenomegaly measuring approximately 19 cm in craniocaudal length. Adrenals/Urinary Tract: The adrenal glands are unremarkable. Mild bilateral renal parenchyma atrophy. There is no hydronephrosis or nephrolithiasis on either side. The visualized ureters and urinary bladder appear unremarkable. Stomach/Bowel: Several small scattered colonic diverticula without active inflammatory changes. There is no bowel obstruction or active inflammation. Normal appendix. Vascular/Lymphatic: The abdominal aorta and IVC are grossly unremarkable on this noncontrast CT. No portal venous gas. There is no adenopathy. Reproductive: The uterus is anteverted and grossly unremarkable. No adnexal masses. Other: A 2.1 x 1.5 cm nodular density posterior to the rectum likely represents scarring related to prior inflammatory changes or collection. No drainable fluid identified. No air is seen within this area. Musculoskeletal: No acute or significant osseous findings. IMPRESSION: 1. Pulmonary edema with superimposed multifocal pneumonia. Clinical correlation and follow-up to resolution recommended. 2. No acute intra-abdominal or pelvic pathology. No bowel obstruction or active inflammation. Normal appendix. 3. Splenomegaly. 4. Posterior perirectal scarring.  No drainable fluid collection. Electronically Signed   By: Anner Crete M.D.   On: 12/10/2018 10:58   Ct Chest Wo Contrast  Result Date: 12/10/2018 CLINICAL DATA:  48 year old female with weakness dizziness. History of kidney stones. History of Crohn's disease and rectovaginal fistula. EXAM: CT CHEST, ABDOMEN AND PELVIS WITHOUT CONTRAST TECHNIQUE: Multidetector CT imaging of the chest, abdomen and pelvis was performed following the standard protocol  without  IV contrast. COMPARISON:  Chest radiograph dated 12/10/2018 and CT of the abdomen pelvis dated 07/23/2018. FINDINGS: Evaluation of this exam is limited in the absence of intravenous contrast. CT CHEST FINDINGS Cardiovascular: There is no cardiomegaly or pericardial effusion. The thoracic aorta and the central pulmonary arteries are unremarkable. Mediastinum/Nodes: No hilar or mediastinal adenopathy. The esophagus and the thyroid gland are grossly unremarkable. No mediastinal fluid collection. Lungs/Pleura: There is diffuse interstitial and interlobular septal thickening consistent with edema. Bilateral, upper lobe predominant, patchy airspace opacities most consistent with multifocal pneumonia. Trace bilateral pleural effusions noted. There is no pneumothorax. The central airways are patent. Musculoskeletal: No chest wall mass or suspicious bone lesions identified. CT ABDOMEN PELVIS FINDINGS No intra-abdominal free air or free fluid. Hepatobiliary: The liver is unremarkable. No intrahepatic biliary ductal dilatation. The gallbladder is unremarkable. Pancreas: Unremarkable. No pancreatic ductal dilatation or surrounding inflammatory changes. Spleen: Splenomegaly measuring approximately 19 cm in craniocaudal length. Adrenals/Urinary Tract: The adrenal glands are unremarkable. Mild bilateral renal parenchyma atrophy. There is no hydronephrosis or nephrolithiasis on either side. The visualized ureters and urinary bladder appear unremarkable. Stomach/Bowel: Several small scattered colonic diverticula without active inflammatory changes. There is no bowel obstruction or active inflammation. Normal appendix. Vascular/Lymphatic: The abdominal aorta and IVC are grossly unremarkable on this noncontrast CT. No portal venous gas. There is no adenopathy. Reproductive: The uterus is anteverted and grossly unremarkable. No adnexal masses. Other: A 2.1 x 1.5 cm nodular density posterior to the rectum likely represents  scarring related to prior inflammatory changes or collection. No drainable fluid identified. No air is seen within this area. Musculoskeletal: No acute or significant osseous findings. IMPRESSION: 1. Pulmonary edema with superimposed multifocal pneumonia. Clinical correlation and follow-up to resolution recommended. 2. No acute intra-abdominal or pelvic pathology. No bowel obstruction or active inflammation. Normal appendix. 3. Splenomegaly. 4. Posterior perirectal scarring.  No drainable fluid collection. Electronically Signed   By: Anner Crete M.D.   On: 12/10/2018 10:58   Dg Chest Portable 1 View  Result Date: 12/10/2018 CLINICAL DATA:  Shortness of breath. EXAM: PORTABLE CHEST 1 VIEW COMPARISON:  December 19, 2006. FINDINGS: The heart size and mediastinal contours are within normal limits. No pneumothorax or pleural effusion is noted. Mildly increased interstitial densities are noted throughout both lungs concerning for possible pulmonary edema. The visualized skeletal structures are unremarkable. IMPRESSION: Mildly increased interstitial densities are noted throughout both lungs concerning for possible pulmonary edema. Electronically Signed   By: Marijo Conception M.D.   On: 12/10/2018 09:34    EKG: Independently reviewed.  Sinus tachycardia 108 bpm.  Assessment/Plan Active Problems:   Sepsis ([place])    Septic shock secondary to multifocal pneumonia with likely aspiration -Discontinue Levaquin and maintain on vancomycin and Zosyn -MRSA nares -Continue IV fluid -Monitor CBC and lactic acid -Blood cultures and urine cultures obtained -Norepinephrine to maintain MAP greater than 65 with right femoral line and likely need for PICC line placement by a.m. -Arterial line per respiratory -Maintain on maintenance IV fluid with 30 cc/kg bolus given in ED  Acute hypoxemic respiratory failure secondary to multifocal pneumonia along with pulmonary edema -Continue on BiPAP -Appreciate pulmonology  evaluation -We will need continuance of IV fluid for now given septic shock and soft blood pressure readings  AKI secondary to sepsis -Baseline creatinine 1.3-1.5 noted on prior lab work -Given IV fluids -Avoid nephrotoxic agents -Follow strict I's and O's -A.m. renal panel  Intractable nausea and vomiting -GERD uncertain etiology with possible viral gastroenteritis  and/or pneumonia -CT of abdomen with no acute findings -Zofran as needed  Worsening anemia -Stool FOBT negative -Baseline hemoglobin noted to be 14.2 previously -We will check anemia panel -Repeat H&H and transfuse if hemoglobin less than 7 -Potential for Mallory-Weiss tear given ongoing nausea and vomiting and may require GI evaluation if continued downward trend noted with hemoglobin  Thrombocytopenia -Continue to monitor and avoid heparin agents  Crohn's disease -Hold Entyvio  Chronic pain -Hold oral agents in order IV pain medications as needed  Anxiety/depression -Continue home medications  Bipolar affective disorder -Continue home medications  GERD -PPI IV twice daily  Hypertension -Hold home blood pressure agents on account of shock physiology  Critical care time 70 minutes.  DVT prophylaxis: SCDs Code Status: Full Family Communication: Attempted to call spouse with no response Disposition Plan:Admit to ICU with IV antibiotics and norepinephrine; follow cultures Consults called:Pulmonology Admission status: Inpatient, ICU   Nema Oatley D Darnell Jeschke DO [facility]  If 7PM-7AM, please contact night-coverage [email or web address]  12/10/2018, 1:13 PM

## 2018-12-10 NOTE — Progress Notes (Addendum)
Pharmacy Antibiotic Note  [name] [name] is a 48 y.o. female admitted on 12/10/2018 with sepsis.  Pharmacy has been consulted for vancomycin and Zosyn  dosing. Renal function is impaired, but she states she's producing urine.     Plan: Give Zosyn 3.375g IV x1 dose over 30 minutes, then Zosyn 3.375g IV q12h Loading dose:  total dose of  vancomycin 1.75g Maintenance dose:   1g IV  q48h or dose by random vancomycin levels if renal function doesn't improve Goal vancomycin trough range: 15-20 mcg/mL    Pharmacy will continue to monitor renal function, vancomycin troughs as clinically appropriate,  cultures and patient progress.    Height: 5\' 2"  (157.5 cm) Weight: 155 lb (70.3 kg) IBW/kg (Calculated) : 50.1  Temp (24hrs), Avg:98.2 F (36.8 C), Min:98.2 F (36.8 C), Max:98.2 F (36.8 C)  Recent Labs  Lab 12/10/18 0619 12/10/18 0752  WBC 4.0  --   CREATININE 4.54*  --   LATICACIDVEN 0.9 2.2*    Estimated Creatinine Clearance: 13.8 mL/min (A) (by C-G formula based on SCr of 4.54 mg/dL (H)).    Allergies  Allergen Reactions  . Cephalexin     itching  . Codeine   . Guaifenesin   . Imitrex [Sumatriptan]     2 fingers got very hot  . Morphine   . 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 Sulfonamide Derivatives   . Ultram [Tramadol Hcl]     [employer] syndrome with either remicade or ultram    Antimicrobials this admission: levofloxacin 10/21 >> x1 dose Zosyn 10/21 >>   vancomycin 10/21>>  Dose adjustments this admission: Zosyn, vancomycin  Microbiology results: 10-21 BC x2:   10-21 UCx:   10-21 MRSA PCR:   Thank you for allowing pharmacy to be a part of this patient's care.  Despina Pole, Pharm. D. Clinical Pharmacist 12/10/2018 12:25 PM

## 2018-12-11 ENCOUNTER — Inpatient Hospital Stay: Payer: Self-pay

## 2018-12-11 LAB — BLOOD CULTURE ID PANEL (REFLEXED)

## 2018-12-11 LAB — CBC
HCT: 28.4 % — ABNORMAL LOW (ref 36.0–46.0)
Hemoglobin: 9.5 g/dL — ABNORMAL LOW (ref 12.0–15.0)
MCH: 31.3 pg (ref 26.0–34.0)
MCHC: 33.5 g/dL (ref 30.0–36.0)
MCV: 93.4 fL (ref 80.0–100.0)
Platelets: 79 10*3/uL — ABNORMAL LOW (ref 150–400)
RBC: 3.04 MIL/uL — ABNORMAL LOW (ref 3.87–5.11)
RDW: 13.9 % (ref 11.5–15.5)
WBC: 4.3 10*3/uL (ref 4.0–10.5)
nRBC: 0 % (ref 0.0–0.2)

## 2018-12-11 LAB — COMPREHENSIVE METABOLIC PANEL
ALT: 12 U/L (ref 0–44)
AST: 15 U/L (ref 15–41)
Albumin: 2.8 g/dL — ABNORMAL LOW (ref 3.5–5.0)
Alkaline Phosphatase: 28 U/L — ABNORMAL LOW (ref 38–126)
Anion gap: 11 (ref 5–15)
BUN: 32 mg/dL — ABNORMAL HIGH (ref 6–20)
CO2: 17 mmol/L — ABNORMAL LOW (ref 22–32)
Calcium: 7.3 mg/dL — ABNORMAL LOW (ref 8.9–10.3)
Chloride: 110 mmol/L (ref 98–111)
Creatinine, Ser: 2.41 mg/dL — ABNORMAL HIGH (ref 0.44–1.00)
GFR calc Af Amer: 26 mL/min — ABNORMAL LOW (ref >60)
GFR calc non Af Amer: 23 mL/min — ABNORMAL LOW (ref >60)
Glucose, Bld: 148 mg/dL — ABNORMAL HIGH (ref 70–99)
Potassium: 3.6 mmol/L (ref 3.5–5.1)
Sodium: 138 mmol/L (ref 135–145)
Total Bilirubin: 1.1 mg/dL (ref 0.3–1.2)
Total Protein: 5.4 g/dL — ABNORMAL LOW (ref 6.5–8.1)

## 2018-12-11 LAB — RETICULOCYTES
Immature Retic Fract: 13.7 % (ref 2.3–15.9)
RBC.: 3.06 MIL/uL — ABNORMAL LOW (ref 3.87–5.11)
Retic Count, Absolute: 39.2 10*3/uL (ref 19.0–186.0)
Retic Ct Pct: 1.3 % (ref 0.4–3.1)

## 2018-12-11 LAB — IRON AND TIBC
Iron: 11 ug/dL — ABNORMAL LOW (ref 28–170)
Saturation Ratios: 5 % — ABNORMAL LOW (ref 10.4–31.8)
TIBC: 219 ug/dL — ABNORMAL LOW (ref 250–450)
UIBC: 208 ug/dL

## 2018-12-11 LAB — FERRITIN: Ferritin: 133 ng/mL (ref 11–307)

## 2018-12-11 LAB — MAGNESIUM: Magnesium: 1.6 mg/dL — ABNORMAL LOW (ref 1.7–2.4)

## 2018-12-11 LAB — VITAMIN B12: Vitamin B-12: 845 pg/mL (ref 180–914)

## 2018-12-11 LAB — LACTIC ACID, PLASMA: Lactic Acid, Venous: 0.9 mmol/L (ref 0.5–1.9)

## 2018-12-11 LAB — FOLATE: Folate: 5.6 ng/mL — ABNORMAL LOW (ref >5.9)

## 2018-12-11 MED ORDER — PIPERACILLIN-TAZOBACTAM 3.375 G IVPB: 3.3750 g | Freq: Three times a day (TID) | INTRAVENOUS | Status: DC

## 2018-12-11 MED ORDER — PIPERACILLIN-TAZOBACTAM 3.375 G IVPB
3.3750 g | Freq: Three times a day (TID) | INTRAVENOUS | Status: DC
  Administered 2018-12-11 – 2018-12-15 (×12): 3.375 g via INTRAVENOUS
  Filled 2018-12-11 (×12): qty 50

## 2018-12-11 MED ORDER — FLUTICASONE PROPIONATE 50 MCG/ACT NA SUSP
1.0000 | Freq: Every day | NASAL | Status: DC
  Administered 2018-12-11 – 2018-12-15 (×5): 1 via NASAL
  Filled 2018-12-11: qty 16

## 2018-12-11 MED ORDER — LACTATED RINGERS IV SOLN
INTRAVENOUS | Status: DC
  Administered 2018-12-11 – 2018-12-14 (×8): via INTRAVENOUS

## 2018-12-11 MED ORDER — MAGNESIUM SULFATE 2 GM/50ML IV SOLN
2.0000 g | Freq: Once | INTRAVENOUS | Status: AC
  Administered 2018-12-11: 09:00:00 2 g via INTRAVENOUS
  Filled 2018-12-11: qty 50

## 2018-12-11 MED ORDER — SODIUM CHLORIDE 0.9% FLUSH
10.0000 mL | Freq: Two times a day (BID) | INTRAVENOUS | Status: DC
  Administered 2018-12-11 – 2018-12-14 (×6): 10 mL
  Administered 2018-12-15: 30 mL

## 2018-12-11 MED ORDER — SODIUM CHLORIDE 0.9% FLUSH: 10.0000 mL | INTRAVENOUS | Status: DC | PRN

## 2018-12-11 NOTE — Progress Notes (Signed)
Peripherally Inserted Central Catheter/Midline Placement  The IV Nurse has discussed with the patient and/or persons authorized to consent for the patient, the purpose of this procedure and the potential benefits and risks involved with this procedure.  The benefits include less needle sticks, lab draws from the catheter, and the patient may be discharged home with the catheter. Risks include, but not limited to, infection, bleeding, blood clot (thrombus formation), and puncture of an artery; nerve damage and irregular heartbeat and possibility to perform a PICC exchange if needed/ordered by physician.  Alternatives to this procedure were also discussed.  Bard Power PICC patient education guide, fact sheet on infection prevention and patient information card has been provided to patient /or left at bedside.    PICC/Midline Placement Documentation  PICC Triple Lumen [id number] PICC Right Basilic 39 cm 0 cm (Active)  Indication for Insertion or Continuance of Line Vasoactive infusions 12/11/18 1317  Exposed Catheter (cm) 0 cm 12/11/18 1317  Site Assessment Clean;Dry;Intact 12/11/18 1317  Lumen #1 Status Flushed;Saline locked;Blood return noted 12/11/18 1317  Lumen #2 Status Flushed;Saline locked;Blood return noted 12/11/18 1317  Lumen #3 Status Flushed;Saline locked;Blood return noted 12/11/18 1317  Dressing Type Transparent 12/11/18 1317  Dressing Status Clean;Dry;Intact;Antimicrobial disc in place 12/11/18 1317  Dressing Change Due 12/18/18 12/11/18 1317       Gordan Payment 12/11/2018, 1:18 PM

## 2018-12-11 NOTE — Progress Notes (Signed)
PICC line RN at bedside

## 2018-12-11 NOTE — Progress Notes (Signed)
Pharmacy Antibiotic Note  [name] [name] is a 48 y.o. female admitted on 12/10/2018 with sepsis.  Pharmacy has been consulted for  Zosyn  dosing. Renal function is improving.   multifocal pneumonia with likely aspiration along with E. coli bacteremia likely secondary to UTI  Plan: Increase Zosyn 3.375g IV q8h EID over 4 hours F/U cxs and clinical progress Monitor V/S, labs    Height: 5\' 2"  (157.5 cm) Weight: 160 lb 11.5 oz (72.9 kg) IBW/kg (Calculated) : 50.1  Temp (24hrs), Avg:98.2 F (36.8 C), Min:97.7 F (36.5 C), Max:98.8 F (37.1 C)  Recent Labs  Lab 12/10/18 0619 12/10/18 0752 12/10/18 1233 12/11/18 0419  WBC 4.0  --   --  4.3  CREATININE 4.54*  --   --  2.41*  LATICACIDVEN 0.9 2.2* 1.2 0.9    Estimated Creatinine Clearance: 26.4 mL/min (A) (by C-G formula based on SCr of 2.41 mg/dL (H)).    Allergies  Allergen Reactions  . Cephalexin     itching  . Codeine   . Guaifenesin   . Imitrex [Sumatriptan]     2 fingers got very hot  . Morphine   . 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 Sulfonamide Derivatives   . Ultram [Tramadol Hcl]     [employer] syndrome with either remicade or ultram    Antimicrobials this admission: levofloxacin 10/21 >> x1 dose Zosyn 10/21 >>   vancomycin 10/21>>10/22  Dose adjustments this admission: Zosyn  Microbiology results: 10-21 BC x2:  BCID  E.Coli 10-21 UCx:   10-21 MRSA PCR: negative  Thank you for allowing pharmacy to be a part of this patient's care.  Isac Sarna, BS Pharm D, [place] Clinical Pharmacist Pager [phone] 12/11/2018 8:54 AM

## 2018-12-11 NOTE — Progress Notes (Signed)
PROGRESS NOTE    [name] [name]  XJO:[id number] DOB: [birth year] DOA: 12/10/2018 PCP: Cassandria Anger, MD   Brief Narrative:  Per HPI: [name] [name] is a 48 y.o. female with medical history significant for bipolar affective disorder, depression/anxiety, hypertension, GERD, and Crohn's disease who began having nausea and vomiting that started on 10/17.  She has been vomiting daily and started to feel weak, dizzy, and lightheaded.  She feels as though she passed a kidney stone last week as well.  She denies any chest pain, shortness of breath, fevers or chills.  She has been taking her medications otherwise as usual.  She denies any coughing.  10/22: Patient appears to be doing much better this morning and is currently on nasal cannula oxygen.  Her norepinephrine is being weaned.  She appears to have E. coli bacteremia likely secondary to UTI.  She is eager to have a diet.  Will need PICC line placement today and femoral line removal this afternoon as it would be a 24 hours.  Assessment & Plan:   Active Problems:   Sepsis ([place])   Septic shock secondary to multifocal pneumonia with likely aspiration along with E. coli bacteremia likely secondary to UTI -Continue on Zosyn with vancomycin discontinued -MRSA nares negative -Continue IV fluid now with LR -Monitor CBC in a.m. with lactic acid that has down trended -Blood cultures with E. coli bacteremia likely related to UTI with urine cultures pending -Norepinephrine to maintain MAP greater than 65 with right femoral line to be discontinued this afternoon with PICC line placement ordered -IV LR for maintenance to continue  Acute hypoxemic respiratory failure secondary to multifocal pneumonia along with pulmonary edema-improving -Continue nasal cannula oxygen -Appreciate pulmonology evaluation -We will need continuance of IV fluid for now given septic shock and soft blood pressure readings  AKI secondary to  sepsis-improving -Baseline creatinine 1.3-1.5 noted on prior lab work -Continue IV fluids and monitor I's and O's -Avoid nephrotoxic agents -A.m. 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 had infectious mononucleosis at a young age -Continue to monitor and avoid heparin agents  Crohn's disease -Hold Entyvio  Chronic pain -Hold oral agents in order IV pain medications as needed  Anxiety/depression -Continue home medications  Bipolar affective disorder -Continue home medications  GERD -PPI IV twice daily  Hypertension -Hold home blood pressure agents on account of shock physiology -Wean norepinephrine as tolerated   DVT prophylaxis: SCDs Code Status: Full Family Communication: Discussed with husband at bedside on 10/21 Disposition Plan: Continue to wean norepinephrine and maintain on Zosyn as ordered for aspiration pneumonia and follow E. coli sensitivities.  Urine cultures pending.   Consultants:   Pulmonology  Procedures:   None  Antimicrobials:  Anti-infectives (From admission, onward)   Start     Dose/Rate Route Frequency Ordered Stop   12/12/18 1200  vancomycin (VANCOCIN) IVPB 1000 mg/200 mL premix  Status:  Discontinued     1,000 mg 200 mL/hr over 60 Minutes Intravenous Every 48 hours 12/10/18 1226 12/11/18 0759   12/11/18 0000  piperacillin-tazobactam (ZOSYN) IVPB 3.375 g     3.375 g 12.5 mL/hr over 240 Minutes  Intravenous Every 12 hours 12/10/18 1245     12/10/18 1300  vancomycin (VANCOCIN) IVPB 750 mg/150 ml premix     750  mg 150 mL/hr over 60 Minutes Intravenous  Once 12/10/18 1136 12/10/18 1452   12/10/18 1200  vancomycin (VANCOCIN) IVPB 1000 mg/200 mL premix     1,000 mg 200 mL/hr over 60 Minutes Intravenous  Once 12/10/18 1136 12/10/18 1322   12/10/18 1145  piperacillin-tazobactam (ZOSYN) IVPB 3.375 g     3.375 g 100 mL/hr over 30 Minutes Intravenous  Once 12/10/18 1139 12/10/18 1246   12/10/18 0930  levofloxacin (LEVAQUIN) IVPB 750 mg     750 mg 100 mL/hr over 90 Minutes Intravenous  Once 12/10/18 0916 12/10/18 1204       Subjective: Patient seen and evaluated today with no new acute complaints or concerns. No acute concerns or events noted overnight.  Objective: Vitals:   12/11/18 0545 12/11/18 0600 12/11/18 0615 12/11/18 0700  BP: 111/64 102/63 108/62 106/73  Pulse: (!) 106 (!) 103 (!) 102 (!) 121  Resp: (!) 23 (!) 24 17 (!) 26  Temp:      TempSrc:      SpO2: 94% 95% 95% 95%  Weight:      Height:        Intake/Output Summary (Last 24 hours) at 12/11/2018 0812 Last data filed at 12/11/2018 0747 Gross per 24 hour  Intake 4262.87 ml  Output 1300 ml  Net 2962.87 ml   Filed Weights   12/10/18 0516 12/11/18 0500  Weight: 70.3 kg 72.9 kg    Examination:  General exam: Appears calm and comfortable  Respiratory system: Clear to auscultation. Respiratory effort normal.  Currently on 2 L nasal cannula oxygen. Cardiovascular system: S1 & S2 heard, RRR. No JVD, murmurs, rubs, gallops or clicks. No pedal edema. Gastrointestinal system: Abdomen is nondistended, soft and nontender. No organomegaly or masses felt. Normal bowel sounds heard. Central nervous system: Alert and oriented. No focal neurological deficits. Extremities: Symmetric 5 x 5 power. Skin: No rashes, lesions or ulcers Psychiatry: Judgement and insight appear normal. Mood & affect appropriate.     Data Reviewed: I have personally reviewed following labs and imaging studies  CBC: Recent Labs  Lab 12/10/18 0619  12/10/18 1403 12/11/18 0419  WBC 4.0  --  4.3  NEUTROABS 3.2  --   --   HGB 8.7* 9.7* 9.5*  HCT 26.2* 29.8* 28.4*  MCV 95.6  --  93.4  PLT 72*  --  79*   Basic Metabolic Panel: Recent Labs  Lab 12/10/18 0619 12/11/18 0419  NA 134* 138  K 3.5 3.6  CL 103 110  CO2 20* 17*  GLUCOSE 117* 148*  BUN 43* 32*  CREATININE 4.54* 2.41*  CALCIUM 7.4* 7.3*  MG  --  1.6*   GFR: Estimated Creatinine Clearance: 26.4 mL/min (A) (by C-G formula based on SCr of 2.41 mg/dL (H)). Liver Function Tests: Recent Labs  Lab 12/10/18 0619 12/11/18 0419  AST 12* 15  ALT 13 12  ALKPHOS 28* 28*  BILITOT 0.6 1.1  PROT 5.5* 5.4*  ALBUMIN 2.9* 2.8*   Recent Labs  Lab 12/10/18 0619  LIPASE 14   No results for input(s): AMMONIA in the last 168 hours. Coagulation Profile: No results for input(s): INR, PROTIME in the last 168 hours. Cardiac Enzymes: No results for input(s): CKTOTAL, CKMB, CKMBINDEX, TROPONINI in the last 168 hours. BNP (last 3 results) Recent Labs    08/25/18 0844  PROBNP 70.0  HbA1C: No results for input(s): HGBA1C in the last 72 hours. CBG: No results for input(s): GLUCAP in the last 168 hours. Lipid Profile: No results for input(s): CHOL, HDL, LDLCALC, TRIG, CHOLHDL, LDLDIRECT in the last 72 hours. Thyroid Function Tests: No results for input(s): TSH, T4TOTAL, FREET4, T3FREE, THYROIDAB in the last 72 hours. Anemia Panel: No results for input(s): VITAMINB12, FOLATE, FERRITIN, TIBC, IRON, RETICCTPCT in the last 72 hours. Sepsis Labs: Recent Labs  Lab 12/10/18 1610 12/10/18 0752 12/10/18 1233 12/11/18 0419  LATICACIDVEN 0.9 2.2* 1.2 0.9    Recent Results (from the past 240 hour(s))  Culture, blood (routine x 2)     Status: None (Preliminary result)   Collection Time: 12/10/18  9:50 AM   Specimen: BLOOD LEFT HAND  Result Value Ref Range Status   Specimen Description BLOOD LEFT HAND  Final   Special Requests   Final    BOTTLES DRAWN AEROBIC AND ANAEROBIC  Blood Culture adequate volume   Culture   Final    NO GROWTH < 24 HOURS Performed at [facility], [address] [place], [place] [address]    Report Status PENDING  Incomplete  Culture, blood (routine x 2)     Status: None (Preliminary result)   Collection Time: 12/10/18  9:51 AM   Specimen: BLOOD RIGHT HAND  Result Value Ref Range Status   Specimen Description   Final    BLOOD RIGHT HAND Performed at [facility], [address] [place], [place] [address]    Special Requests   Final    BOTTLES DRAWN AEROBIC AND ANAEROBIC Blood Culture results may not be optimal due to an inadequate volume of blood received in culture bottles Performed at [facility], [address], [place], [place] [address]    Culture  Setup Time   Final    IN BOTH AEROBIC AND ANAEROBIC BOTTLES GRAM NEGATIVE RODS Gram Stain Report Called to,Read Back By and Verified With: J HEARN,RN @2248  12/10/18 MKELLY CRITICAL RESULT CALLED TO, READ BACK BY AND VERIFIED WITH: H TETREAULT RN 12/11/18 0412 JDW    Culture   Final    CULTURE REINCUBATED FOR BETTER GROWTH Performed at [facility], [place] [address] [place], [place] [address]    Report Status PENDING  Incomplete  Blood Culture ID Panel (Reflexed)     Status: Abnormal   Collection Time: 12/10/18  9:51 AM  Result Value Ref Range Status   Enterococcus species NOT DETECTED NOT DETECTED Final   Listeria monocytogenes NOT DETECTED NOT DETECTED Final   Staphylococcus species NOT DETECTED NOT DETECTED Final   Staphylococcus aureus (BCID) NOT DETECTED NOT DETECTED Final   Streptococcus species NOT DETECTED NOT DETECTED Final   Streptococcus agalactiae NOT DETECTED NOT DETECTED Final   Streptococcus pneumoniae NOT DETECTED NOT DETECTED Final   Streptococcus pyogenes NOT DETECTED NOT DETECTED Final   Acinetobacter baumannii NOT DETECTED NOT DETECTED Final   Enterobacteriaceae species DETECTED (A) NOT DETECTED Final    Comment: Enterobacteriaceae represent a  large family of gram-negative bacteria, not a single organism. CRITICAL RESULT CALLED TO, READ BACK BY AND VERIFIED WITH: H TETREAULT RN 12/11/18 0412 JDW    Enterobacter cloacae complex NOT DETECTED NOT DETECTED Final   Escherichia coli DETECTED (A) NOT DETECTED Final    Comment: CRITICAL RESULT CALLED TO, READ BACK BY AND VERIFIED WITH: H TETREAULT RN 12/11/18 0412 JDW    Klebsiella oxytoca NOT DETECTED NOT DETECTED Final   Klebsiella pneumoniae NOT DETECTED NOT DETECTED Final  Proteus species NOT DETECTED NOT DETECTED Final   Serratia marcescens NOT DETECTED NOT DETECTED Final   Carbapenem resistance NOT DETECTED NOT DETECTED Final   Haemophilus influenzae NOT DETECTED NOT DETECTED Final   Neisseria meningitidis NOT DETECTED NOT DETECTED Final   Pseudomonas aeruginosa NOT DETECTED NOT DETECTED Final   Candida albicans NOT DETECTED NOT DETECTED Final   Candida glabrata NOT DETECTED NOT DETECTED Final   Candida krusei NOT DETECTED NOT DETECTED Final   Candida parapsilosis NOT DETECTED NOT DETECTED Final   Candida tropicalis NOT DETECTED NOT DETECTED Final    Comment: Performed at [facility], [place] [address], [place], [place] [address]  SARS Coronavirus 2 by RT PCR (hospital order, performed in [facility] hospital lab) Nasopharyngeal Nasopharyngeal Swab     Status: None   Collection Time: 12/10/18 10:03 AM   Specimen: Nasopharyngeal Swab  Result Value Ref Range Status   SARS Coronavirus 2 NEGATIVE NEGATIVE Final    Comment: (NOTE) If result is NEGATIVE SARS-CoV-2 target nucleic acids are NOT DETECTED. The SARS-CoV-2 RNA is generally detectable in upper and lower  respiratory specimens during the acute phase of infection. The lowest  concentration of SARS-CoV-2 viral copies this assay can detect is 250  copies / mL. A negative result does not preclude SARS-CoV-2 infection  and should not be used as the sole basis for treatment or other  patient management decisions.  A  negative result may occur with  improper specimen collection / handling, submission of specimen other  than nasopharyngeal swab, presence of viral mutation(s) within the  areas targeted by this assay, and inadequate number of viral copies  (<250 copies / mL). A negative result must be combined with clinical  observations, patient history, and epidemiological information. If result is POSITIVE SARS-CoV-2 target nucleic acids are DETECTED. The SARS-CoV-2 RNA is generally detectable in upper and lower  respiratory specimens dur ing the acute phase of infection.  Positive  results are indicative of active infection with SARS-CoV-2.  Clinical  correlation with patient history and other diagnostic information is  necessary to determine patient infection status.  Positive results do  not rule out bacterial infection or co-infection with other viruses. If result is PRESUMPTIVE POSTIVE SARS-CoV-2 nucleic acids MAY BE PRESENT.   A presumptive positive result was obtained on the submitted specimen  and confirmed on repeat testing.  While 2019 novel coronavirus  (SARS-CoV-2) nucleic acids may be present in the submitted sample  additional confirmatory testing may be necessary for epidemiological  and / or clinical management purposes  to differentiate between  SARS-CoV-2 and other Sarbecovirus currently known to infect humans.  If clinically indicated additional testing with an alternate test  methodology [phone]) is advised. The SARS-CoV-2 RNA is generally  detectable in upper and lower respiratory sp ecimens during the acute  phase of infection. The expected result is Negative. Fact Sheet for Patients:  StrictlyIdeas.no Fact Sheet for Healthcare Providers: BankingDealers.co.za This test is not yet approved or cleared by the [place] FDA and has been authorized for detection and/or diagnosis of SARS-CoV-2 by FDA under an Emergency Use  Authorization (EUA).  This EUA will remain in effect (meaning this test can be used) for the duration of the COVID-19 declaration under Section 564(b)(1) of the Act, 21 U.S.C. section 360bbb-3(b)(1), unless the authorization is terminated or revoked sooner. Performed at [facility], [address], [place], [place] [address]   Surgical pcr screen     Status: Abnormal  Collection Time: 12/10/18 11:22 AM   Specimen: Nasal Mucosa; Nasal Swab  Result Value Ref Range Status   MRSA, PCR NEGATIVE NEGATIVE Final   Staphylococcus aureus POSITIVE (A) NEGATIVE Final    Comment: RESULT CALLED TO, READ BACK BY AND VERIFIED WITH: SMITH,J ON 12/10/18 AT 1750 BY LOY,C (NOTE) The Xpert SA Assay (FDA approved for NASAL specimens in patients 61 years of age and older), is one component of a comprehensive surveillance program. It is not intended to diagnose infection nor to guide or monitor treatment. Performed at [facility], [address] [place], [place] [address]          Radiology Studies: Ct Abdomen Pelvis Wo Contrast  Result Date: 12/10/2018 CLINICAL DATA:  48 year old female with weakness dizziness. History of kidney stones. History of Crohn's disease and rectovaginal fistula. EXAM: CT CHEST, ABDOMEN AND PELVIS WITHOUT CONTRAST TECHNIQUE: Multidetector CT imaging of the chest, abdomen and pelvis was performed following the standard protocol without IV contrast. COMPARISON:  Chest radiograph dated 12/10/2018 and CT of the abdomen pelvis dated 07/23/2018. FINDINGS: Evaluation of this exam is limited in the absence of intravenous contrast. CT CHEST FINDINGS Cardiovascular: There is no cardiomegaly or pericardial effusion. The thoracic aorta and the central pulmonary arteries are unremarkable. Mediastinum/Nodes: No hilar or mediastinal adenopathy. The esophagus and the thyroid gland are grossly unremarkable. No mediastinal fluid collection. Lungs/Pleura: There is diffuse interstitial and  interlobular septal thickening consistent with edema. Bilateral, upper lobe predominant, patchy airspace opacities most consistent with multifocal pneumonia. Trace bilateral pleural effusions noted. There is no pneumothorax. The central airways are patent. Musculoskeletal: No chest wall mass or suspicious bone lesions identified. CT ABDOMEN PELVIS FINDINGS No intra-abdominal free air or free fluid. Hepatobiliary: The liver is unremarkable. No intrahepatic biliary ductal dilatation. The gallbladder is unremarkable. Pancreas: Unremarkable. No pancreatic ductal dilatation or surrounding inflammatory changes. Spleen: Splenomegaly measuring approximately 19 cm in craniocaudal length. Adrenals/Urinary Tract: The adrenal glands are unremarkable. Mild bilateral renal parenchyma atrophy. There is no hydronephrosis or nephrolithiasis on either side. The visualized ureters and urinary bladder appear unremarkable. Stomach/Bowel: Several small scattered colonic diverticula without active inflammatory changes. There is no bowel obstruction or active inflammation. Normal appendix. Vascular/Lymphatic: The abdominal aorta and IVC are grossly unremarkable on this noncontrast CT. No portal venous gas. There is no adenopathy. Reproductive: The uterus is anteverted and grossly unremarkable. No adnexal masses. Other: A 2.1 x 1.5 cm nodular density posterior to the rectum likely represents scarring related to prior inflammatory changes or collection. No drainable fluid identified. No air is seen within this area. Musculoskeletal: No acute or significant osseous findings. IMPRESSION: 1. Pulmonary edema with superimposed multifocal pneumonia. Clinical correlation and follow-up to resolution recommended. 2. No acute intra-abdominal or pelvic pathology. No bowel obstruction or active inflammation. Normal appendix. 3. Splenomegaly. 4. Posterior perirectal scarring.  No drainable fluid collection. Electronically Signed   By: Anner Crete  M.D.   On: 12/10/2018 10:58   Ct Chest Wo Contrast  Result Date: 12/10/2018 CLINICAL DATA:  48 year old female with weakness dizziness. History of kidney stones. History of Crohn's disease and rectovaginal fistula. EXAM: CT CHEST, ABDOMEN AND PELVIS WITHOUT CONTRAST TECHNIQUE: Multidetector CT imaging of the chest, abdomen and pelvis was performed following the standard protocol without IV contrast. COMPARISON:  Chest radiograph dated 12/10/2018 and CT of the abdomen pelvis dated 07/23/2018. FINDINGS: Evaluation of this exam is limited in the absence of intravenous contrast. CT CHEST FINDINGS Cardiovascular: There is no cardiomegaly or pericardial effusion.  The thoracic aorta and the central pulmonary arteries are unremarkable. Mediastinum/Nodes: No hilar or mediastinal adenopathy. The esophagus and the thyroid gland are grossly unremarkable. No mediastinal fluid collection. Lungs/Pleura: There is diffuse interstitial and interlobular septal thickening consistent with edema. Bilateral, upper lobe predominant, patchy airspace opacities most consistent with multifocal pneumonia. Trace bilateral pleural effusions noted. There is no pneumothorax. The central airways are patent. Musculoskeletal: No chest wall mass or suspicious bone lesions identified. CT ABDOMEN PELVIS FINDINGS No intra-abdominal free air or free fluid. Hepatobiliary: The liver is unremarkable. No intrahepatic biliary ductal dilatation. The gallbladder is unremarkable. Pancreas: Unremarkable. No pancreatic ductal dilatation or surrounding inflammatory changes. Spleen: Splenomegaly measuring approximately 19 cm in craniocaudal length. Adrenals/Urinary Tract: The adrenal glands are unremarkable. Mild bilateral renal parenchyma atrophy. There is no hydronephrosis or nephrolithiasis on either side. The visualized ureters and urinary bladder appear unremarkable. Stomach/Bowel: Several small scattered colonic diverticula without active inflammatory  changes. There is no bowel obstruction or active inflammation. Normal appendix. Vascular/Lymphatic: The abdominal aorta and IVC are grossly unremarkable on this noncontrast CT. No portal venous gas. There is no adenopathy. Reproductive: The uterus is anteverted and grossly unremarkable. No adnexal masses. Other: A 2.1 x 1.5 cm nodular density posterior to the rectum likely represents scarring related to prior inflammatory changes or collection. No drainable fluid identified. No air is seen within this area. Musculoskeletal: No acute or significant osseous findings. IMPRESSION: 1. Pulmonary edema with superimposed multifocal pneumonia. Clinical correlation and follow-up to resolution recommended. 2. No acute intra-abdominal or pelvic pathology. No bowel obstruction or active inflammation. Normal appendix. 3. Splenomegaly. 4. Posterior perirectal scarring.  No drainable fluid collection. Electronically Signed   By: Anner Crete M.D.   On: 12/10/2018 10:58   Dg Chest Portable 1 View  Result Date: 12/10/2018 CLINICAL DATA:  Shortness of breath. EXAM: PORTABLE CHEST 1 VIEW COMPARISON:  December 19, 2006. FINDINGS: The heart size and mediastinal contours are within normal limits. No pneumothorax or pleural effusion is noted. Mildly increased interstitial densities are noted throughout both lungs concerning for possible pulmonary edema. The visualized skeletal structures are unremarkable. IMPRESSION: Mildly increased interstitial densities are noted throughout both lungs concerning for possible pulmonary edema. Electronically Signed   By: Marijo Conception M.D.   On: 12/10/2018 09:34   [place] Ekg Site Rite  Result Date: 12/11/2018 If Site Rite image not attached, placement could not be confirmed due to current cardiac rhythm.       Scheduled Meds:  ALPRAZolam  0.5 mg Oral BID   chlorhexidine  15 mL Mouth Rinse BID   Chlorhexidine Gluconate Cloth  6 each Topical Daily   fluticasone  1 spray Each Nare  Daily   lamoTRIgine  200 mg Oral QHS   mouth rinse  15 mL Mouth Rinse q12n4p   mupirocin ointment  1 application Nasal BID   pantoprazole (PROTONIX) IV  40 mg Intravenous Q12H   sertraline  200 mg Oral QHS   varenicline  1 mg Oral BID   Continuous Infusions:  sodium chloride 250 mL (12/10/18 1203)   sodium chloride     lactated ringers     magnesium sulfate bolus IVPB     norepinephrine (LEVOPHED) Adult infusion 6 mcg/min (12/11/18 0747)   piperacillin-tazobactam (ZOSYN)  IV Stopped (12/11/18 0341)     LOS: 1 day    Time spent: 30 minutes    Aubriauna Riner Darleen Crocker, DO [facility] Pager [phone]  If 7PM-7AM, please contact night-coverage [email or web address] Password [email or web address]  12/11/2018, 8:12 AM

## 2018-12-11 NOTE — Consult Note (Signed)
Consult requested by: [facility] hospital[facility], Dr. Manuella Ghazi Consult requested for: Multifocal pneumonia/sepsis  HPI: This is a 48 year old who has past medical history of hypertension, Crohn's disease, bipolar affective disorder with depression and anxiety and who came to the emergency department after having had nausea and vomiting that started on the 17th.  She says developed increasing weakness and eventually came to the emergency department because of her weakness.  She was still having nausea and vomiting.  She was found to be hypotensive was given fluid bolus but her blood pressure was still low and she was started on Levophed.  She required BiPAP.  She has diffuse bilateral infiltrates.  She had acute renal failure.  She had thrombocytopenia.  She was placed on BiPAP initially and now is on nasal cannula.  Despite the severity of her illness and her septic shock she was awake and alert and able to hold conversations.  She remains awake and alert says that she is hungry and wants to eat.  Her blood pressure still around 295 systolic and she is still on some Levophed.  She is coughing mostly nonproductively.  She says it makes her eyeballs hurt when she coughs.  She also has some chest pain when she coughs.  She still has some discomfort in her abdomen but she is not vomiting.  She says she is not nauseated now.  She has not had any hemoptysis.  She does have headache.  No urinary symptoms.  Past Medical History:  Diagnosis Date  . Anxiety   . Arthritis    Dr. Eddie Dibbles  . Bartholin cyst 2008   vag.  [name] BIPOLAR AFFECTIVE DISORDER 04/14/2007  . 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  [name] Depression   . Elevated glucose 2010  . GERD (gastroesophageal reflux disease)   . Hypertension   . Kidney stone   . LBP (low back pain)    Dr. Mina Marble  . Perianal abscess 2009  . Vitamin B12 deficiency      Family History  Problem Relation Age of  Onset  . Diabetes Mother   . Heart disease Mother   . Heart attack Mother   . Cancer Maternal Aunt        ovarian  . Breast cancer Maternal Grandmother   . Early death Maternal Grandmother 31  . Cancer Maternal Grandmother        breast  . Heart attack Father   . Heart disease Father   . Cancer Maternal Grandfather        colon     Social History   Socioeconomic History  . Marital status: Married    Spouse name: Not on file  . Number of children: Not on file  . Years of education: Not on file  . Highest education level: Not on file  Occupational History  . Not on file  Social Needs  . Financial resource strain: Not on file  . Food insecurity    Worry: Not on file    Inability: Not on file  . Transportation needs    Medical: Not on file    Non-medical: Not on file  Tobacco Use  . Smoking status: Current Every Day Smoker    Packs/day: 0.50    Years: 25.00    Pack years: 12.50    Types: Cigarettes  . Smokeless tobacco: Never Used  Substance and Sexual Activity  . Alcohol use: No  . Drug use: No  .  Sexual activity: Yes    Partners: Male    Birth control/protection: Pill  Lifestyle  . Physical activity    Days per week: Not on file    Minutes per session: Not on file  . Stress: Not on file  Relationships  . Social Herbalist on phone: Not on file    Gets together: Not on file    Attends religious service: Not on file    Active member of club or organization: Not on file    Attends meetings of clubs or organizations: Not on file    Relationship status: Not on file  Other Topics Concern  . Not on file  Social History Narrative   Regular Exercise-no     ROS: Except as mentioned 10 point review of systems is negative    Objective: Vital signs in last 24 hours: Temp:  [98.8 F (37.1 C)] 98.8 F (37.1 C) (10/21 1558) Pulse Rate:  [88-146] 102 (10/22 0615) Resp:  [0-42] 17 (10/22 0615) BP: (70-137)/(40-83) 108/62 (10/22 0615) SpO2:  [88  %-100 %] 95 % (10/22 0615) FiO2 (%):  [45 %-55 %] 45 % (10/21 1600) Weight change:  Last BM Date: 12/10/18  Intake/Output from previous day: 10/21 0701 - 10/22 0700 In: 6087.4 [I.V.:2342.5; IV Piggyback:3744.8] Out: 300 [Urine:300]  PHYSICAL EXAM Constitutional: She is awake and alert.  Eyes: Pupils react EOMI.  Ears nose mouth and throat: Mucous membranes are slightly dry.  Neck is supple.  Cardiovascular: Heart is regular with tachycardia at about 110.  No gallop.  Respiratory: Respiratory effort slightly increased.  She has bilateral rales and rhonchi.  Gastrointestinal: Her abdomen is soft with no masses.  Skin: She is diffusely sweaty.  Musculoskeletal: Grossly normal strength bilaterally.  Neurological: No focal abnormalities.  Psychiatric: Normal mood and affect  Lab Results: Basic Metabolic Panel: Recent Labs    12/10/18 0619 12/11/18 0419  NA 134* 138  K 3.5 3.6  CL 103 110  CO2 20* 17*  GLUCOSE 117* 148*  BUN 43* 32*  CREATININE 4.54* 2.41*  CALCIUM 7.4* 7.3*  MG  --  1.6*   Liver Function Tests: Recent Labs    12/10/18 0619 12/11/18 0419  AST 12* 15  ALT 13 12  ALKPHOS 28* 28*  BILITOT 0.6 1.1  PROT 5.5* 5.4*  ALBUMIN 2.9* 2.8*   Recent Labs    12/10/18 0619  LIPASE 14   No results for input(s): AMMONIA in the last 72 hours. CBC: Recent Labs    12/10/18 0619 12/10/18 1403 12/11/18 0419  WBC 4.0  --  4.3  NEUTROABS 3.2  --   --   HGB 8.7* 9.7* 9.5*  HCT 26.2* 29.8* 28.4*  MCV 95.6  --  93.4  PLT 72*  --  79*   Cardiac Enzymes: No results for input(s): CKTOTAL, CKMB, CKMBINDEX, TROPONINI in the last 72 hours. BNP: No results for input(s): PROBNP in the last 72 hours. D-Dimer: No results for input(s): DDIMER in the last 72 hours. CBG: No results for input(s): GLUCAP in the last 72 hours. Hemoglobin A1C: No results for input(s): HGBA1C in the last 72 hours. Fasting Lipid Panel: No results for input(s): CHOL, HDL, LDLCALC, TRIG, CHOLHDL,  LDLDIRECT in the last 72 hours. Thyroid Function Tests: No results for input(s): TSH, T4TOTAL, FREET4, T3FREE, THYROIDAB in the last 72 hours. Anemia Panel: No results for input(s): VITAMINB12, FOLATE, FERRITIN, TIBC, IRON, RETICCTPCT in the last 72 hours. Coagulation: No results for input(s): LABPROT,  INR in the last 72 hours. Urine Drug Screen: Drugs of Abuse     Component Value Date/Time   LABOPIA POSITIVE (A) 12/10/2018 0832   COCAINSCRNUR NONE DETECTED 12/10/2018 0832   COCAINSCRNUR NEG 09/02/2012 0838   LABBENZ POSITIVE (A) 12/10/2018 0832   LABBENZ POS (A) 09/02/2012 0838   AMPHETMU NONE DETECTED 12/10/2018 0832   THCU NONE DETECTED 12/10/2018 0832   LABBARB NONE DETECTED 12/10/2018 0832    Alcohol Level: No results for input(s): ETH in the last 72 hours. Urinalysis: Recent Labs    12/10/18 0832  COLORURINE AMBER*  LABSPEC 1.017  PHURINE 5.0  GLUCOSEU NEGATIVE  HGBUR MODERATE*  BILIRUBINUR NEGATIVE  KETONESUR NEGATIVE  PROTEINUR 100*  NITRITE NEGATIVE  LEUKOCYTESUR LARGE*   Misc. Labs:   ABGS: No results for input(s): PHART, PO2ART, TCO2, HCO3 in the last 72 hours.  Invalid input(s): PCO2   MICROBIOLOGY: Recent Results (from the past 240 hour(s))  Culture, blood (routine x 2)     Status: None (Preliminary result)   Collection Time: 12/10/18  9:50 AM   Specimen: BLOOD LEFT HAND  Result Value Ref Range Status   Specimen Description BLOOD LEFT HAND  Final   Special Requests   Final    BOTTLES DRAWN AEROBIC AND ANAEROBIC Blood Culture adequate volume Performed at [facility], [address], [place], [place] [address]    Culture PENDING  Incomplete   Report Status PENDING  Incomplete  Culture, blood (routine x 2)     Status: None (Preliminary result)   Collection Time: 12/10/18  9:51 AM   Specimen: BLOOD RIGHT HAND  Result Value Ref Range Status   Specimen Description   Final    BLOOD RIGHT HAND Performed at [facility], [address],  [place], [place] [address]    Special Requests   Final    BOTTLES DRAWN AEROBIC AND ANAEROBIC Blood Culture results may not be optimal due to an inadequate volume of blood received in culture bottles Performed at [facility], [address] [place], [place] [address]    Culture  Setup Time   Final    IN BOTH AEROBIC AND ANAEROBIC BOTTLES GRAM NEGATIVE RODS Gram Stain Report Called to,Read Back By and Verified With: J HEARN,RN @2248  12/10/18 MKELLY Organism ID to follow CRITICAL RESULT CALLED TO, READ BACK BY AND VERIFIED WITH: H TETREAULT RN 12/11/18 0412 JDW    Culture   Final    CULTURE REINCUBATED FOR BETTER GROWTH Performed at [facility], [place] [address], [place], [place] [address]    Report Status PENDING  Incomplete  Blood Culture ID Panel (Reflexed)     Status: Abnormal   Collection Time: 12/10/18  9:51 AM  Result Value Ref Range Status   Enterococcus species NOT DETECTED NOT DETECTED Final   Listeria monocytogenes NOT DETECTED NOT DETECTED Final   Staphylococcus species NOT DETECTED NOT DETECTED Final   Staphylococcus aureus (BCID) NOT DETECTED NOT DETECTED Final   Streptococcus species NOT DETECTED NOT DETECTED Final   Streptococcus agalactiae NOT DETECTED NOT DETECTED Final   Streptococcus pneumoniae NOT DETECTED NOT DETECTED Final   Streptococcus pyogenes NOT DETECTED NOT DETECTED Final   Acinetobacter baumannii NOT DETECTED NOT DETECTED Final   Enterobacteriaceae species DETECTED (A) NOT DETECTED Final    Comment: Enterobacteriaceae represent a large family of gram-negative bacteria, not a single organism. CRITICAL RESULT CALLED TO, READ BACK BY AND VERIFIED WITH: H TETREAULT RN 12/11/18 0412 JDW    Enterobacter cloacae complex  NOT DETECTED NOT DETECTED Final   Escherichia coli DETECTED (A) NOT DETECTED Final    Comment: CRITICAL RESULT CALLED TO, READ BACK BY AND VERIFIED WITH: H TETREAULT RN 12/11/18 0412 JDW    Klebsiella oxytoca NOT DETECTED NOT DETECTED  Final   Klebsiella pneumoniae NOT DETECTED NOT DETECTED Final   Proteus species NOT DETECTED NOT DETECTED Final   Serratia marcescens NOT DETECTED NOT DETECTED Final   Carbapenem resistance NOT DETECTED NOT DETECTED Final   Haemophilus influenzae NOT DETECTED NOT DETECTED Final   Neisseria meningitidis NOT DETECTED NOT DETECTED Final   Pseudomonas aeruginosa NOT DETECTED NOT DETECTED Final   Candida albicans NOT DETECTED NOT DETECTED Final   Candida glabrata NOT DETECTED NOT DETECTED Final   Candida krusei NOT DETECTED NOT DETECTED Final   Candida parapsilosis NOT DETECTED NOT DETECTED Final   Candida tropicalis NOT DETECTED NOT DETECTED Final    Comment: Performed at [facility], [place] [address], [place], [place] [address]  SARS Coronavirus 2 by RT PCR (hospital order, performed in [facility] hospital lab) Nasopharyngeal Nasopharyngeal Swab     Status: None   Collection Time: 12/10/18 10:03 AM   Specimen: Nasopharyngeal Swab  Result Value Ref Range Status   SARS Coronavirus 2 NEGATIVE NEGATIVE Final    Comment: (NOTE) If result is NEGATIVE SARS-CoV-2 target nucleic acids are NOT DETECTED. The SARS-CoV-2 RNA is generally detectable in upper and lower  respiratory specimens during the acute phase of infection. The lowest  concentration of SARS-CoV-2 viral copies this assay can detect is 250  copies / mL. A negative result does not preclude SARS-CoV-2 infection  and should not be used as the sole basis for treatment or other  patient management decisions.  A negative result may occur with  improper specimen collection / handling, submission of specimen other  than nasopharyngeal swab, presence of viral mutation(s) within the  areas targeted by this assay, and inadequate number of viral copies  (<250 copies / mL). A negative result must be combined with clinical  observations, patient history, and epidemiological information. If result is POSITIVE SARS-CoV-2 target nucleic acids  are DETECTED. The SARS-CoV-2 RNA is generally detectable in upper and lower  respiratory specimens dur ing the acute phase of infection.  Positive  results are indicative of active infection with SARS-CoV-2.  Clinical  correlation with patient history and other diagnostic information is  necessary to determine patient infection status.  Positive results do  not rule out bacterial infection or co-infection with other viruses. If result is PRESUMPTIVE POSTIVE SARS-CoV-2 nucleic acids MAY BE PRESENT.   A presumptive positive result was obtained on the submitted specimen  and confirmed on repeat testing.  While 2019 novel coronavirus  (SARS-CoV-2) nucleic acids may be present in the submitted sample  additional confirmatory testing may be necessary for epidemiological  and / or clinical management purposes  to differentiate between  SARS-CoV-2 and other Sarbecovirus currently known to infect humans.  If clinically indicated additional testing with an alternate test  methodology [phone]) is advised. The SARS-CoV-2 RNA is generally  detectable in upper and lower respiratory sp ecimens during the acute  phase of infection. The expected result is Negative. Fact Sheet for Patients:  StrictlyIdeas.no Fact Sheet for Healthcare Providers: BankingDealers.co.za This test is not yet approved or cleared by the [place] FDA and has been authorized for detection and/or diagnosis of SARS-CoV-2 by FDA under an Emergency Use Authorization (EUA).  This EUA will remain in effect (  meaning this test can be used) for the duration of the COVID-19 declaration under Section 564(b)(1) of the Act, 21 U.S.C. section 360bbb-3(b)(1), unless the authorization is terminated or revoked sooner. Performed at [facility], [address], [place], [place] [address]   Surgical pcr screen     Status: Abnormal   Collection Time: 12/10/18 11:22 AM   Specimen: Nasal Mucosa;  Nasal Swab  Result Value Ref Range Status   MRSA, PCR NEGATIVE NEGATIVE Final   Staphylococcus aureus POSITIVE (A) NEGATIVE Final    Comment: RESULT CALLED TO, READ BACK BY AND VERIFIED WITH: SMITH,J ON 12/10/18 AT 1750 BY LOY,C (NOTE) The Xpert SA Assay (FDA approved for NASAL specimens in patients 22 years of age and older), is one component of a comprehensive surveillance program. It is not intended to diagnose infection nor to guide or monitor treatment. Performed at [facility], [address] [place], [place] [address]     Studies/Results: Ct Abdomen Pelvis Wo Contrast  Result Date: 12/10/2018 CLINICAL DATA:  48 year old female with weakness dizziness. History of kidney stones. History of Crohn's disease and rectovaginal fistula. EXAM: CT CHEST, ABDOMEN AND PELVIS WITHOUT CONTRAST TECHNIQUE: Multidetector CT imaging of the chest, abdomen and pelvis was performed following the standard protocol without IV contrast. COMPARISON:  Chest radiograph dated 12/10/2018 and CT of the abdomen pelvis dated 07/23/2018. FINDINGS: Evaluation of this exam is limited in the absence of intravenous contrast. CT CHEST FINDINGS Cardiovascular: There is no cardiomegaly or pericardial effusion. The thoracic aorta and the central pulmonary arteries are unremarkable. Mediastinum/Nodes: No hilar or mediastinal adenopathy. The esophagus and the thyroid gland are grossly unremarkable. No mediastinal fluid collection. Lungs/Pleura: There is diffuse interstitial and interlobular septal thickening consistent with edema. Bilateral, upper lobe predominant, patchy airspace opacities most consistent with multifocal pneumonia. Trace bilateral pleural effusions noted. There is no pneumothorax. The central airways are patent. Musculoskeletal: No chest wall mass or suspicious bone lesions identified. CT ABDOMEN PELVIS FINDINGS No intra-abdominal free air or free fluid. Hepatobiliary: The liver is unremarkable. No intrahepatic  biliary ductal dilatation. The gallbladder is unremarkable. Pancreas: Unremarkable. No pancreatic ductal dilatation or surrounding inflammatory changes. Spleen: Splenomegaly measuring approximately 19 cm in craniocaudal length. Adrenals/Urinary Tract: The adrenal glands are unremarkable. Mild bilateral renal parenchyma atrophy. There is no hydronephrosis or nephrolithiasis on either side. The visualized ureters and urinary bladder appear unremarkable. Stomach/Bowel: Several small scattered colonic diverticula without active inflammatory changes. There is no bowel obstruction or active inflammation. Normal appendix. Vascular/Lymphatic: The abdominal aorta and IVC are grossly unremarkable on this noncontrast CT. No portal venous gas. There is no adenopathy. Reproductive: The uterus is anteverted and grossly unremarkable. No adnexal masses. Other: A 2.1 x 1.5 cm nodular density posterior to the rectum likely represents scarring related to prior inflammatory changes or collection. No drainable fluid identified. No air is seen within this area. Musculoskeletal: No acute or significant osseous findings. IMPRESSION: 1. Pulmonary edema with superimposed multifocal pneumonia. Clinical correlation and follow-up to resolution recommended. 2. No acute intra-abdominal or pelvic pathology. No bowel obstruction or active inflammation. Normal appendix. 3. Splenomegaly. 4. Posterior perirectal scarring.  No drainable fluid collection. Electronically Signed   By: Anner Crete M.D.   On: 12/10/2018 10:58   Ct Chest Wo Contrast  Result Date: 12/10/2018 CLINICAL DATA:  48 year old female with weakness dizziness. History of kidney stones. History of Crohn's disease and rectovaginal fistula. EXAM: CT CHEST, ABDOMEN AND PELVIS WITHOUT CONTRAST TECHNIQUE: Multidetector CT imaging of the chest, abdomen  and pelvis was performed following the standard protocol without IV contrast. COMPARISON:  Chest radiograph dated 12/10/2018 and CT  of the abdomen pelvis dated 07/23/2018. FINDINGS: Evaluation of this exam is limited in the absence of intravenous contrast. CT CHEST FINDINGS Cardiovascular: There is no cardiomegaly or pericardial effusion. The thoracic aorta and the central pulmonary arteries are unremarkable. Mediastinum/Nodes: No hilar or mediastinal adenopathy. The esophagus and the thyroid gland are grossly unremarkable. No mediastinal fluid collection. Lungs/Pleura: There is diffuse interstitial and interlobular septal thickening consistent with edema. Bilateral, upper lobe predominant, patchy airspace opacities most consistent with multifocal pneumonia. Trace bilateral pleural effusions noted. There is no pneumothorax. The central airways are patent. Musculoskeletal: No chest wall mass or suspicious bone lesions identified. CT ABDOMEN PELVIS FINDINGS No intra-abdominal free air or free fluid. Hepatobiliary: The liver is unremarkable. No intrahepatic biliary ductal dilatation. The gallbladder is unremarkable. Pancreas: Unremarkable. No pancreatic ductal dilatation or surrounding inflammatory changes. Spleen: Splenomegaly measuring approximately 19 cm in craniocaudal length. Adrenals/Urinary Tract: The adrenal glands are unremarkable. Mild bilateral renal parenchyma atrophy. There is no hydronephrosis or nephrolithiasis on either side. The visualized ureters and urinary bladder appear unremarkable. Stomach/Bowel: Several small scattered colonic diverticula without active inflammatory changes. There is no bowel obstruction or active inflammation. Normal appendix. Vascular/Lymphatic: The abdominal aorta and IVC are grossly unremarkable on this noncontrast CT. No portal venous gas. There is no adenopathy. Reproductive: The uterus is anteverted and grossly unremarkable. No adnexal masses. Other: A 2.1 x 1.5 cm nodular density posterior to the rectum likely represents scarring related to prior inflammatory changes or collection. No drainable fluid  identified. No air is seen within this area. Musculoskeletal: No acute or significant osseous findings. IMPRESSION: 1. Pulmonary edema with superimposed multifocal pneumonia. Clinical correlation and follow-up to resolution recommended. 2. No acute intra-abdominal or pelvic pathology. No bowel obstruction or active inflammation. Normal appendix. 3. Splenomegaly. 4. Posterior perirectal scarring.  No drainable fluid collection. Electronically Signed   By: Anner Crete M.D.   On: 12/10/2018 10:58   Dg Chest Portable 1 View  Result Date: 12/10/2018 CLINICAL DATA:  Shortness of breath. EXAM: PORTABLE CHEST 1 VIEW COMPARISON:  December 19, 2006. FINDINGS: The heart size and mediastinal contours are within normal limits. No pneumothorax or pleural effusion is noted. Mildly increased interstitial densities are noted throughout both lungs concerning for possible pulmonary edema. The visualized skeletal structures are unremarkable. IMPRESSION: Mildly increased interstitial densities are noted throughout both lungs concerning for possible pulmonary edema. Electronically Signed   By: Marijo Conception M.D.   On: 12/10/2018 09:34    Medications:  Prior to Admission:  Medications Prior to Admission  Medication Sig Dispense Refill Last Dose  . ALPRAZolam (XANAX) 1 MG tablet Take 1 mg by mouth 2 (two) times daily.   12/09/2018 at Unknown time  . benazepril-hydrochlorthiazide (LOTENSIN HCT) 20-12.5 MG tablet Take 1 tablet by mouth daily. 90 tablet 3 12/09/2018 at Unknown time  . Cholecalciferol (VITAMIN D3) 250 MCG (10000 UT) capsule Take 10,000 Units by mouth daily.   12/09/2018 at Unknown time  . cyanocobalamin (,VITAMIN B-12,) 1000 MCG/ML injection INJECT 1ML INTO THE SKIN EVERY 14 DAYS (Patient taking differently: Every 10 days) 6 mL 11 Past Month at Unknown time  . cyclobenzaprine (FLEXERIL) 5 MG tablet TAKE 1 TABLET (5 MG TOTAL) BY MOUTH AT BEDTIME AS NEEDED FOR MUSCLE SPASMS. 30 tablet 1 12/09/2018 at  Unknown time  . diclofenac sodium (VOLTAREN) 1 % GEL Apply 1 application topically  4 (four) times daily. 100 g 3 12/09/2018 at Unknown time  . HYDROcodone-acetaminophen (NORCO) 10-325 MG tablet Take 1 tablet by mouth every 8 (eight) hours as needed for severe pain. 90 tablet 0 12/09/2018 at Unknown time  . ketorolac (TORADOL) 10 MG tablet Take 1 tablet (10 mg total) by mouth every 6 (six) hours as needed. for pain 20 tablet 0   . lamoTRIgine (LAMICTAL) 200 MG tablet Take 1 tablet by mouth daily.   12/09/2018 at Unknown time  . norethindrone (AYGESTIN) 5 MG tablet Take 1 tablet (5 mg total) by mouth daily. 90 tablet 3 12/09/2018 at Unknown time  . pantoprazole (PROTONIX) 40 MG tablet 1 PO 30 MINUTES PRIOR TO YOUR FIRST MEAL QD 90 tablet 3 12/09/2018 at Unknown time  . potassium chloride (K-DUR) 10 MEQ tablet TAKE 1 TABLET BY MOUTH EVERY DAY 90 tablet 3 12/09/2018 at Unknown time  . sertraline (ZOLOFT) 100 MG tablet Take 200 mg by mouth daily.    12/09/2018 at Unknown time  . sodium chloride 0.9 % SOLN 250 mL with vedolizumab 300 MG SOLR 300 mg Inject 300 mg into the vein. LOADING DOSE THEN Q8 WEEKS     . SYRINGE-NEEDLE, DISP, 3 ML (B-D SYRINGE/NEEDLE 3CC/25GX5/8) 25G X 5/8" 3 ML MISC Use to administer B12 injection every 14 days 24 each 0   . varenicline (CHANTIX CONTINUING MONTH PAK) 1 MG tablet Take 1 tablet (1 mg total) by mouth 2 (two) times daily. 60 tablet 4 12/09/2018 at Unknown time  . vedolizumab (ENTYVIO) 300 MG injection Inject into the vein. Every 8 weeks     . ciprofloxacin (CIPRO) 500 MG tablet Take 1 tablet (500 mg total) by mouth 2 (two) times daily. (Patient not taking: Reported on 12/10/2018) 28 tablet 1 Completed Course at Unknown time  . fluconazole (DIFLUCAN) 150 MG tablet Take 1 tablet (150 mg total) by mouth every three (3) days as needed. (Patient not taking: Reported on 12/10/2018) 2 tablet 1 Not Taking at Unknown time  . ondansetron (ZOFRAN) 4 MG tablet Take 1 tablet (4 mg  total) by mouth every 8 (eight) hours as needed for nausea or vomiting. (Patient not taking: Reported on 12/10/2018) 20 tablet 0 Not Taking at Unknown time  . promethazine (PHENERGAN) 25 MG tablet Take 1 tablet (25 mg total) by mouth 2 (two) times daily as needed for nausea. (Patient not taking: Reported on 12/10/2018) 60 tablet 0 Not Taking at Unknown time  . varenicline (CHANTIX STARTING MONTH PAK) 0.5 MG X 11 & 1 MG X 42 tablet Take one 0.5 mg tablet by mouth once daily for 3 days, then increase to one 0.5 mg tablet twice daily for 4 days, then increase to one 1 mg tablet twice daily. (Patient not taking: Reported on 12/10/2018) 53 tablet 0 Completed Course at Unknown time   Scheduled: . ALPRAZolam  0.5 mg Oral BID  . chlorhexidine  15 mL Mouth Rinse BID  . Chlorhexidine Gluconate Cloth  6 each Topical Daily  . lamoTRIgine  200 mg Oral QHS  . mouth rinse  15 mL Mouth Rinse q12n4p  . mupirocin ointment  1 application Nasal BID  . pantoprazole (PROTONIX) IV  40 mg Intravenous Q12H  . sertraline  200 mg Oral QHS  . varenicline  1 mg Oral BID   Continuous: . sodium chloride 250 mL (12/10/18 1203)  . sodium chloride    . sodium chloride 100 mL/hr at 12/11/18 0623  . norepinephrine (LEVOPHED) Adult infusion 6  mcg/min (12/11/18 0211)  . piperacillin-tazobactam (ZOSYN)  IV Stopped (12/11/18 0341)  . [START ON 12/12/2018] vancomycin     DBZ:MCEYE/MVVKPQAE arterial line **AND** sodium chloride, acetaminophen **OR** acetaminophen, HYDROmorphone (DILAUDID) injection, ondansetron **OR** ondansetron (ZOFRAN) IV  Assesment: She was admitted with sepsis and septic shock.  It appears that initially she had likely a viral gastroenteritis with severe nausea and vomiting and has developed pneumonia which I think is likely from aspiration.  She is being covered for that.  She is still relatively hypotensive and still requiring Levophed.  However she is awake and alert.  Full identification not available yet  but it appears that she has E. coli in the blood  At baseline she has Crohn's disease and is on treatment for that so she is likely immunocompromised at baseline  She had acute kidney injury and acute renal failure and her renal function has improved with rehydration  She appeared to be severely dehydrated on admission.  She has been receiving IV fluids  CT chest which I have personally reviewed looks like she has multifocal pneumonia and there is also a suggestion of pulmonary edema.  She required BiPAP briefly but is now on nasal cannula.  She is anemic and thrombocytopenic  Lactate level is elevated and has come down to normal Active Problems:   Sepsis ([place])    Plan: She seems to be improving.  Agree with current treatments.  Await further identification of the organism in her bloodstream and await urine culture since her urinalysis did show some pyuria.  Taper Levophed as tolerated.    LOS: 1 day   Alonza Bogus 12/11/2018, 7:16 AM

## 2018-12-12 LAB — BASIC METABOLIC PANEL
Anion gap: 7 (ref 5–15)
BUN: 20 mg/dL (ref 6–20)
CO2: 23 mmol/L (ref 22–32)
Calcium: 7.9 mg/dL — ABNORMAL LOW (ref 8.9–10.3)
Chloride: 107 mmol/L (ref 98–111)
Creatinine, Ser: 1.53 mg/dL — ABNORMAL HIGH (ref 0.44–1.00)
GFR calc Af Amer: 46 mL/min — ABNORMAL LOW (ref >60)
GFR calc non Af Amer: 40 mL/min — ABNORMAL LOW (ref >60)
Glucose, Bld: 129 mg/dL — ABNORMAL HIGH (ref 70–99)
Potassium: 3.4 mmol/L — ABNORMAL LOW (ref 3.5–5.1)
Sodium: 137 mmol/L (ref 135–145)

## 2018-12-12 LAB — URINE CULTURE: Culture: >=100000 — AB

## 2018-12-12 LAB — MAGNESIUM: Magnesium: 1.6 mg/dL — ABNORMAL LOW (ref 1.7–2.4)

## 2018-12-12 LAB — CBC
HCT: 25.9 % — ABNORMAL LOW (ref 36.0–46.0)
Hemoglobin: 8.5 g/dL — ABNORMAL LOW (ref 12.0–15.0)
MCH: 30.7 pg (ref 26.0–34.0)
MCHC: 32.8 g/dL (ref 30.0–36.0)
MCV: 93.5 fL (ref 80.0–100.0)
Platelets: 75 10*3/uL — ABNORMAL LOW (ref 150–400)
RBC: 2.77 MIL/uL — ABNORMAL LOW (ref 3.87–5.11)
RDW: 13.8 % (ref 11.5–15.5)
WBC: 4.3 10*3/uL (ref 4.0–10.5)
nRBC: 0 % (ref 0.0–0.2)

## 2018-12-12 MED ORDER — MAGNESIUM SULFATE 2 GM/50ML IV SOLN
2.0000 g | Freq: Once | INTRAVENOUS | Status: AC
  Administered 2018-12-12: 09:00:00 2 g via INTRAVENOUS
  Filled 2018-12-12: qty 50

## 2018-12-12 MED ORDER — FOLIC ACID 1 MG PO TABS
1.0000 mg | ORAL_TABLET | Freq: Every day | ORAL | Status: DC
  Administered 2018-12-12 – 2018-12-15 (×4): 1 mg via ORAL
  Filled 2018-12-12 (×5): qty 1

## 2018-12-12 MED ORDER — SODIUM CHLORIDE 0.9 % IV SOLN
510.0000 mg | Freq: Once | INTRAVENOUS | Status: AC
  Administered 2018-12-12: 10:00:00 510 mg via INTRAVENOUS
  Filled 2018-12-12: qty 17

## 2018-12-12 MED ORDER — POTASSIUM CHLORIDE CRYS ER 20 MEQ PO TBCR
40.0000 meq | EXTENDED_RELEASE_TABLET | Freq: Once | ORAL | Status: AC
  Administered 2018-12-12: 40 meq via ORAL
  Filled 2018-12-12: qty 2

## 2018-12-12 NOTE — Progress Notes (Signed)
PROGRESS NOTE    [name] [name]  TRR:[id number] DOB: 1970-09-05 DOA: 12/10/2018 PCP: Cassandria Anger, MD   Brief Narrative:  Per HPI: [name] Schaffert Curryis a 48 y.o.femalewith medical history significant forbipolar affective disorder, depression/anxiety, hypertension, GERD, and Crohn's disease who began having nausea and vomiting that started on 10/17. She has been vomiting daily and started to feel weak, dizzy, and lightheaded. She feels as though she passed a kidney stone last week as well. She denies any chest pain, shortness of breath, fevers or chills. She has been taking her medications otherwise as usual. She denies any coughing.  10/22: Patient appears to be doing much better this morning and is currently on nasal cannula oxygen.  Her norepinephrine is being weaned.  She appears to have E. coli bacteremia likely secondary to UTI.  She is eager to have a diet.  Will need PICC line placement today and femoral line removal this afternoon as it would be a 24 hours.  10/23: Patient overall doing much better this morning.  She is on low-dose norepinephrine which is currently being weaned.  She is noted to have E. coli bacteremia related to urinary tract infection.  Continue IV Zosyn. Assessment & Plan:   Active Problems:   Sepsis ([place])   Septic shock secondary to multifocal pneumonia with likely aspiration along with E. coli bacteremia likely secondary to UTI -Continue on Zosyn -MRSA nares negative and vancomycin discontinued 10/22 -Continue IV fluid with LR until norepinephrine weaned -Monitor CBC in a.m. with lactic acid that has down trended -Blood cultures with E. coli bacteremia likely related to UTI with urine cultures pending -Norepinephrine to maintain MAP greater than 65 with right femoral line to be discontinued this afternoon with PICC line placement ordered -IV LR for maintenance to continue  Acute hypoxemic respiratory failure secondary to multifocal pneumonia  along with pulmonary edema-improving -Continue nasal cannula oxygen -Appreciate pulmonology evaluation -We will need continuance of IV fluid for now given septic shock and soft blood pressure readings  AKI secondary to sepsis-improving -Baseline creatinine 1.3-1.5 noted on prior lab work -Creatinine now 1.53 and pretty much at baseline -Continue IV fluids and monitor I's and O's -Avoid nephrotoxic agents -A.m. 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  Crohn's disease -Hold Entyvio  Chronic pain -Hold oral agents in order IV pain medications as needed  Anxiety/depression -Continue home medications  Bipolar affective disorder -Continue home medications  GERD -PPI IV twice daily  Hypertension -Hold home blood pressure agents on account of shock physiology -Wean norepinephrine as tolerated   DVT prophylaxis: SCDs Code Status: Full Family Communication: Discussed with husband at bedside on 10/21 Disposition Plan: Continue to wean norepinephrine and maintain on Zosyn as ordered for aspiration pneumonia and follow E. coli sensitivities.    Consultants:   Pulmonology  Procedures:   Central venous line to right femoral region on admission  PICC line  placement 10/22  Antimicrobials:  Anti-infectives (From admission, onward)   Start     Dose/Rate Route Frequency Ordered Stop   12/12/18 1200  vancomycin (VANCOCIN) IVPB 1000 mg/200 mL premix  Status:  Discontinued     1,000 mg 200 mL/hr over 60 Minutes Intravenous Every 48 hours 12/10/18 1226 12/11/18 0759   12/11/18 1100  piperacillin-tazobactam (ZOSYN) IVPB 3.375 g     3.375 g 12.5 mL/hr over 240 Minutes Intravenous Every 8 hours 12/11/18 0902     12/11/18 0900  piperacillin-tazobactam (ZOSYN) IVPB 3.375 g  Status:  Discontinued     3.375 g 12.5 mL/hr over 240 Minutes Intravenous Every 8 hours 12/11/18 0859 12/11/18 0901   12/11/18 0000  piperacillin-tazobactam (ZOSYN) IVPB 3.375 g  Status:  Discontinued     3.375 g 12.5 mL/hr over 240 Minutes Intravenous Every 12 hours 12/10/18 1245 12/11/18 0859   12/10/18 1300  vancomycin (VANCOCIN) IVPB 750 mg/150 ml premix     750 mg 150 mL/hr over 60 Minutes Intravenous  Once 12/10/18 1136 12/10/18 1452   12/10/18 1200  vancomycin (VANCOCIN) IVPB 1000 mg/200 mL premix     1,000 mg 200 mL/hr over 60 Minutes Intravenous  Once 12/10/18 1136 12/10/18 1322   12/10/18 1145  piperacillin-tazobactam (ZOSYN) IVPB 3.375 g     3.375 g 100 mL/hr over 30 Minutes Intravenous  Once 12/10/18 1139 12/10/18 1246   12/10/18 0930  levofloxacin (LEVAQUIN) IVPB 750 mg     750 mg 100 mL/hr over 90 Minutes Intravenous  Once 12/10/18 0916 12/10/18 1204       Subjective: Patient seen and evaluated today with no new acute complaints or concerns. No acute concerns or events noted overnight.  She states that she is "ready to get out of here".  She still remains on norepinephrine however.  Objective: Vitals:   12/12/18 0430 12/12/18 0445 12/12/18 0500 12/12/18 0645  BP: 105/68 101/63 99/66 101/65  Pulse: 78 81 79 83  Resp: (!) 25 (!) 21 (!) 25 (!) 24  Temp:      TempSrc:      SpO2: 100% (!) 86% 90% 97%  Weight:   72.5 kg   Height:        Intake/Output  Summary (Last 24 hours) at 12/12/2018 0716 Last data filed at 12/12/2018 0713 Gross per 24 hour  Intake 3098.11 ml  Output 1600 ml  Net 1498.11 ml   Filed Weights   12/10/18 0516 12/11/18 0500 12/12/18 0500  Weight: 70.3 kg 72.9 kg 72.5 kg    Examination:  General exam: Appears calm and comfortable  Respiratory system: Clear to auscultation. Respiratory effort normal.  Currently on nasal cannula oxygen. Cardiovascular system: S1 & S2 heard, RRR. No JVD, murmurs, rubs, gallops or clicks. No pedal edema. Gastrointestinal system: Abdomen is nondistended, soft and nontender. No organomegaly or masses felt. Normal bowel sounds heard. Central nervous system: Alert and oriented. No focal neurological deficits. Extremities: Symmetric 5 x 5 power. Skin: No rashes, lesions or ulcers Psychiatry: Judgement and insight appear normal. Mood & affect appropriate.     Data Reviewed: I have personally reviewed following labs and imaging studies  CBC: Recent Labs  Lab 12/10/18 0619 12/10/18 1403 12/11/18 0419 12/12/18 0413  WBC 4.0  --  4.3 4.3  NEUTROABS 3.2  --   --   --   HGB 8.7* 9.7* 9.5* 8.5*  HCT 26.2* 29.8* 28.4* 25.9*  MCV 95.6  --  93.4 93.5  PLT 72*  --  79* 75*   Basic Metabolic Panel: Recent Labs  Lab 12/10/18 0619 12/11/18 0419 12/12/18 0413  NA 134* 138 137  K 3.5 3.6 3.4*  CL 103 110 107  CO2 20* 17* 23  GLUCOSE 117* 148* 129*  BUN 43* 32* 20  CREATININE 4.54* 2.41* 1.53*  CALCIUM 7.4* 7.3* 7.9*  MG  --  1.6* 1.6*   GFR: Estimated Creatinine Clearance: 41.5 mL/min (A) (by C-G formula based on SCr of 1.53 mg/dL (H)). Liver Function Tests: Recent Labs  Lab 12/10/18 0619 12/11/18 0419  AST 12* 15  ALT 13 12  ALKPHOS 28* 28*  BILITOT 0.6 1.1  PROT 5.5* 5.4*  ALBUMIN 2.9* 2.8*   Recent Labs  Lab 12/10/18 0619  LIPASE 14   No results for input(s): AMMONIA in the last 168 hours. Coagulation Profile: No results for input(s): INR, PROTIME in the last  168 hours. Cardiac Enzymes: No results for input(s): CKTOTAL, CKMB, CKMBINDEX, TROPONINI in the last 168 hours. BNP (last 3 results) Recent Labs    08/25/18 0844  PROBNP 70.0   HbA1C: No results for input(s): HGBA1C in the last 72 hours. CBG: No results for input(s): GLUCAP in the last 168 hours. Lipid Profile: No results for input(s): CHOL, HDL, LDLCALC, TRIG, CHOLHDL, LDLDIRECT in the last 72 hours. Thyroid Function Tests: No results for input(s): TSH, T4TOTAL, FREET4, T3FREE, THYROIDAB in the last 72 hours. Anemia Panel: Recent Labs    12/10/18 0900 12/11/18 0419 12/11/18 1031  VITAMINB12 845  --   --   FOLATE  --   --  5.6*  FERRITIN 133  --   --   TIBC 219*  --   --   IRON 11*  --   --   RETICCTPCT  --  1.3  --    Sepsis Labs: Recent Labs  Lab 12/10/18 0619 12/10/18 0752 12/10/18 1233 12/11/18 0419  LATICACIDVEN 0.9 2.2* 1.2 0.9    Recent Results (from the past 240 hour(s))  Urine culture     Status: Abnormal (Preliminary result)   Collection Time: 12/10/18  8:32 AM   Specimen: Urine, Clean Catch  Result Value Ref Range Status   Specimen Description   Final    URINE, CLEAN CATCH Performed at [facility], [address] [place], [place] [address]    Special Requests   Final    NONE Performed at [facility], [address], [place], [place] [address]    Culture (A)  Final    >=100,000 COLONIES/mL ESCHERICHIA COLI SUSCEPTIBILITIES TO FOLLOW Performed at [facility], [place] [address] [place], [place] [address]    Report Status PENDING  Incomplete  Culture, blood (routine x 2)     Status: None (Preliminary result)   Collection Time: 12/10/18  9:50 AM   Specimen: Artery-; Blood  Result Value Ref Range Status   Specimen Description BLOOD LEFT HAND  Final   Special Requests   Final    BOTTLES DRAWN AEROBIC AND ANAEROBIC Blood Culture adequate volume   Culture   Final    NO GROWTH < 24 HOURS Performed at [facility], [address]  [place],  [address]    Report Status PENDING  Incomplete  Culture, blood (routine x 2)     Status: None (Preliminary result)   Collection Time: 12/10/18  9:51 AM   Specimen: Artery-; Blood  Result Value Ref Range Status   Specimen Description   Final    BLOOD RIGHT HAND Performed at  [facility], [address] [place], [place] [address]    Special Requests   Final    BOTTLES DRAWN AEROBIC AND ANAEROBIC Blood Culture results may not be optimal due to an inadequate volume of blood received in culture bottles Performed at [facility], [address], [place], [place] [address]    Culture  Setup Time   Final    IN BOTH AEROBIC AND ANAEROBIC BOTTLES GRAM NEGATIVE RODS Gram Stain Report Called to,Read Back By and Verified With: J HEARN,RN @2248  12/10/18 MKELLY CRITICAL RESULT CALLED TO, READ BACK BY AND VERIFIED WITH: H TETREAULT RN 12/11/18 0412 JDW    Culture   Final    CULTURE REINCUBATED FOR BETTER GROWTH Performed at [facility], [place] [address] [place], [place] [address]    Report Status PENDING  Incomplete  Blood Culture ID Panel (Reflexed)     Status: Abnormal   Collection Time: 12/10/18  9:51 AM  Result Value Ref Range Status   Enterococcus species NOT DETECTED NOT DETECTED Final   Listeria monocytogenes NOT DETECTED NOT DETECTED Final   Staphylococcus species NOT DETECTED NOT DETECTED Final   Staphylococcus aureus (BCID) NOT DETECTED NOT DETECTED Final   Streptococcus species NOT DETECTED NOT DETECTED Final   Streptococcus agalactiae NOT DETECTED NOT DETECTED Final   Streptococcus pneumoniae NOT DETECTED NOT DETECTED Final   Streptococcus pyogenes NOT DETECTED NOT DETECTED Final   Acinetobacter baumannii NOT DETECTED NOT DETECTED Final   Enterobacteriaceae species DETECTED (A) NOT DETECTED Final    Comment: Enterobacteriaceae represent a large family of gram-negative bacteria, not a single organism. CRITICAL RESULT CALLED TO, READ BACK BY AND VERIFIED WITH: H  TETREAULT RN 12/11/18 0412 JDW    Enterobacter cloacae complex NOT DETECTED NOT DETECTED Final   Escherichia coli DETECTED (A) NOT DETECTED Final    Comment: CRITICAL RESULT CALLED TO, READ BACK BY AND VERIFIED WITH: H TETREAULT RN 12/11/18 0412 JDW    Klebsiella oxytoca NOT DETECTED NOT DETECTED Final   Klebsiella pneumoniae NOT DETECTED NOT DETECTED Final   Proteus species NOT DETECTED NOT DETECTED Final   Serratia marcescens NOT DETECTED NOT DETECTED Final   Carbapenem resistance NOT DETECTED NOT DETECTED Final   Haemophilus influenzae NOT DETECTED NOT DETECTED Final   Neisseria meningitidis NOT DETECTED NOT DETECTED Final   Pseudomonas aeruginosa NOT DETECTED NOT DETECTED Final   Candida albicans NOT DETECTED NOT DETECTED Final   Candida glabrata NOT DETECTED NOT DETECTED Final   Candida krusei NOT DETECTED NOT DETECTED Final   Candida parapsilosis NOT DETECTED NOT DETECTED Final   Candida tropicalis NOT DETECTED NOT DETECTED Final    Comment: Performed at [facility], [place] [address], [place],  [address]  SARS Coronavirus 2 by RT PCR (hospital order, performed in [facility] hospital lab) Nasopharyngeal Nasopharyngeal Swab     Status: None   Collection Time: 12/10/18 10:03 AM   Specimen: Nasopharyngeal Swab  Result Value Ref Range Status   SARS Coronavirus 2 NEGATIVE NEGATIVE Final    Comment: (NOTE) If result is NEGATIVE SARS-CoV-2 target nucleic acids are NOT DETECTED. The SARS-CoV-2 RNA is generally detectable in upper and lower  respiratory specimens during the acute phase of infection. The lowest  concentration of SARS-CoV-2 viral copies this assay can detect is 250  copies / mL. A negative result does not preclude SARS-CoV-2 infection  and should not be used as the sole basis for treatment or other  patient management decisions.  A negative  result may occur with  improper specimen collection / handling, submission of specimen other  than nasopharyngeal  swab, presence of viral mutation(s) within the  areas targeted by this assay, and inadequate number of viral copies  (<250 copies / mL). A negative result must be combined with clinical  observations, patient history, and epidemiological information. If result is POSITIVE SARS-CoV-2 target nucleic acids are DETECTED. The SARS-CoV-2 RNA is generally detectable in upper and lower  respiratory specimens dur ing the acute phase of infection.  Positive  results are indicative of active infection with SARS-CoV-2.  Clinical  correlation with patient history and other diagnostic information is  necessary to determine patient infection status.  Positive results do  not rule out bacterial infection or co-infection with other viruses. If result is PRESUMPTIVE POSTIVE SARS-CoV-2 nucleic acids MAY BE PRESENT.   A presumptive positive result was obtained on the submitted specimen  and confirmed on repeat testing.  While 2019 novel coronavirus  (SARS-CoV-2) nucleic acids may be present in the submitted sample  additional confirmatory testing may be necessary for epidemiological  and / or clinical management purposes  to differentiate between  SARS-CoV-2 and other Sarbecovirus currently known to infect humans.  If clinically indicated additional testing with an alternate test  methodology [phone]) is advised. The SARS-CoV-2 RNA is generally  detectable in upper and lower respiratory sp ecimens during the acute  phase of infection. The expected result is Negative. Fact Sheet for Patients:  StrictlyIdeas.no Fact Sheet for Healthcare Providers: BankingDealers.co.za This test is not yet approved or cleared by the [place] FDA and has been authorized for detection and/or diagnosis of SARS-CoV-2 by FDA under an Emergency Use Authorization (EUA).  This EUA will remain in effect (meaning this test can be used) for the duration of the COVID-19 declaration  under Section 564(b)(1) of the Act, 21 U.S.C. section 360bbb-3(b)(1), unless the authorization is terminated or revoked sooner. Performed at [facility], [address], [place], [place] [address]   Surgical pcr screen     Status: Abnormal   Collection Time: 12/10/18 11:22 AM   Specimen: Nasal Mucosa; Nasal Swab  Result Value Ref Range Status   MRSA, PCR NEGATIVE NEGATIVE Final   Staphylococcus aureus POSITIVE (A) NEGATIVE Final    Comment: RESULT CALLED TO, READ BACK BY AND VERIFIED WITH: SMITH,J ON 12/10/18 AT 1750 BY LOY,C (NOTE) The Xpert SA Assay (FDA approved for NASAL specimens in patients 70 years of age and older), is one component of a comprehensive surveillance program. It is not intended to diagnose infection nor to guide or monitor treatment. Performed at [facility], [address], [place], [place] [address]          Radiology Studies: Ct Abdomen Pelvis Wo Contrast  Result Date: 12/10/2018 CLINICAL DATA:  48 year old female with weakness dizziness. History of kidney stones. History of Crohn's disease and rectovaginal fistula. EXAM: CT CHEST, ABDOMEN AND PELVIS WITHOUT CONTRAST TECHNIQUE: Multidetector CT imaging of the chest, abdomen and pelvis was performed following the standard protocol without IV contrast. COMPARISON:  Chest radiograph dated 12/10/2018 and CT of the abdomen pelvis dated 07/23/2018. FINDINGS: Evaluation of this exam is limited in the absence of intravenous contrast. CT CHEST FINDINGS Cardiovascular: There is no cardiomegaly or pericardial effusion. The thoracic aorta and the central pulmonary arteries are unremarkable. Mediastinum/Nodes: No hilar or mediastinal adenopathy. The esophagus and the thyroid gland are grossly unremarkable. No mediastinal fluid collection. Lungs/Pleura: There is diffuse interstitial and interlobular septal thickening consistent  with edema. Bilateral, upper lobe predominant, patchy airspace opacities most consistent with  multifocal pneumonia. Trace bilateral pleural effusions noted. There is no pneumothorax. The central airways are patent. Musculoskeletal: No chest wall mass or suspicious bone lesions identified. CT ABDOMEN PELVIS FINDINGS No intra-abdominal free air or free fluid. Hepatobiliary: The liver is unremarkable. No intrahepatic biliary ductal dilatation. The gallbladder is unremarkable. Pancreas: Unremarkable. No pancreatic ductal dilatation or surrounding inflammatory changes. Spleen: Splenomegaly measuring approximately 19 cm in craniocaudal length. Adrenals/Urinary Tract: The adrenal glands are unremarkable. Mild bilateral renal parenchyma atrophy. There is no hydronephrosis or nephrolithiasis on either side. The visualized ureters and urinary bladder appear unremarkable. Stomach/Bowel: Several small scattered colonic diverticula without active inflammatory changes. There is no bowel obstruction or active inflammation. Normal appendix. Vascular/Lymphatic: The abdominal aorta and IVC are grossly unremarkable on this noncontrast CT. No portal venous gas. There is no adenopathy. Reproductive: The uterus is anteverted and grossly unremarkable. No adnexal masses. Other: A 2.1 x 1.5 cm nodular density posterior to the rectum likely represents scarring related to prior inflammatory changes or collection. No drainable fluid identified. No air is seen within this area. Musculoskeletal: No acute or significant osseous findings. IMPRESSION: 1. Pulmonary edema with superimposed multifocal pneumonia. Clinical correlation and follow-up to resolution recommended. 2. No acute intra-abdominal or pelvic pathology. No bowel obstruction or active inflammation. Normal appendix. 3. Splenomegaly. 4. Posterior perirectal scarring.  No drainable fluid collection. Electronically Signed   By: Anner Crete M.D.   On: 12/10/2018 10:58   Ct Chest Wo Contrast  Result Date: 12/10/2018 CLINICAL DATA:  48 year old female with weakness  dizziness. History of kidney stones. History of Crohn's disease and rectovaginal fistula. EXAM: CT CHEST, ABDOMEN AND PELVIS WITHOUT CONTRAST TECHNIQUE: Multidetector CT imaging of the chest, abdomen and pelvis was performed following the standard protocol without IV contrast. COMPARISON:  Chest radiograph dated 12/10/2018 and CT of the abdomen pelvis dated 07/23/2018. FINDINGS: Evaluation of this exam is limited in the absence of intravenous contrast. CT CHEST FINDINGS Cardiovascular: There is no cardiomegaly or pericardial effusion. The thoracic aorta and the central pulmonary arteries are unremarkable. Mediastinum/Nodes: No hilar or mediastinal adenopathy. The esophagus and the thyroid gland are grossly unremarkable. No mediastinal fluid collection. Lungs/Pleura: There is diffuse interstitial and interlobular septal thickening consistent with edema. Bilateral, upper lobe predominant, patchy airspace opacities most consistent with multifocal pneumonia. Trace bilateral pleural effusions noted. There is no pneumothorax. The central airways are patent. Musculoskeletal: No chest wall mass or suspicious bone lesions identified. CT ABDOMEN PELVIS FINDINGS No intra-abdominal free air or free fluid. Hepatobiliary: The liver is unremarkable. No intrahepatic biliary ductal dilatation. The gallbladder is unremarkable. Pancreas: Unremarkable. No pancreatic ductal dilatation or surrounding inflammatory changes. Spleen: Splenomegaly measuring approximately 19 cm in craniocaudal length. Adrenals/Urinary Tract: The adrenal glands are unremarkable. Mild bilateral renal parenchyma atrophy. There is no hydronephrosis or nephrolithiasis on either side. The visualized ureters and urinary bladder appear unremarkable. Stomach/Bowel: Several small scattered colonic diverticula without active inflammatory changes. There is no bowel obstruction or active inflammation. Normal appendix. Vascular/Lymphatic: The abdominal aorta and IVC are  grossly unremarkable on this noncontrast CT. No portal venous gas. There is no adenopathy. Reproductive: The uterus is anteverted and grossly unremarkable. No adnexal masses. Other: A 2.1 x 1.5 cm nodular density posterior to the rectum likely represents scarring related to prior inflammatory changes or collection. No drainable fluid identified. No air is seen within this area. Musculoskeletal: No acute or significant osseous findings. IMPRESSION: 1. Pulmonary  edema with superimposed multifocal pneumonia. Clinical correlation and follow-up to resolution recommended. 2. No acute intra-abdominal or pelvic pathology. No bowel obstruction or active inflammation. Normal appendix. 3. Splenomegaly. 4. Posterior perirectal scarring.  No drainable fluid collection. Electronically Signed   By: Anner Crete M.D.   On: 12/10/2018 10:58   Dg Chest Portable 1 View  Result Date: 12/10/2018 CLINICAL DATA:  Shortness of breath. EXAM: PORTABLE CHEST 1 VIEW COMPARISON:  December 19, 2006. FINDINGS: The heart size and mediastinal contours are within normal limits. No pneumothorax or pleural effusion is noted. Mildly increased interstitial densities are noted throughout both lungs concerning for possible pulmonary edema. The visualized skeletal structures are unremarkable. IMPRESSION: Mildly increased interstitial densities are noted throughout both lungs concerning for possible pulmonary edema. Electronically Signed   By: Marijo Conception M.D.   On: 12/10/2018 09:34   [place] Ekg Site Rite  Result Date: 12/11/2018 If Site Rite image not attached, placement could not be confirmed due to current cardiac rhythm.       Scheduled Meds:  ALPRAZolam  0.5 mg Oral BID   chlorhexidine  15 mL Mouth Rinse BID   Chlorhexidine Gluconate Cloth  6 each Topical Daily   fluticasone  1 spray Each Nare Daily   lamoTRIgine  200 mg Oral QHS   mouth rinse  15 mL Mouth Rinse q12n4p   mupirocin ointment  1 application Nasal BID    pantoprazole (PROTONIX) IV  40 mg Intravenous Q12H   potassium chloride  40 mEq Oral Once   sertraline  200 mg Oral QHS   sodium chloride flush  10-40 mL Intracatheter Q12H   varenicline  1 mg Oral BID   Continuous Infusions:  sodium chloride 250 mL (12/10/18 1203)   sodium chloride     lactated ringers 100 mL/hr at 12/12/18 0713   magnesium sulfate bolus IVPB     norepinephrine (LEVOPHED) Adult infusion 3 mcg/min (12/12/18 0713)   piperacillin-tazobactam (ZOSYN)  IV Stopped (12/12/18 0541)     LOS: 2 days    Time spent: 30 minutes    Ataya Murdy Darleen Crocker, DO [facility] Pager [phone]  If 7PM-7AM, please contact night-coverage [email or web address] Password [email or web address] 12/12/2018, 7:16 AM

## 2018-12-12 NOTE — Progress Notes (Signed)
Subjective: She says she feels better.  She is still on Levophed but the dose has been tapered.  Blood pressures running around 888-9 05 systolic.  She has E. coli in the blood and urine.  Objective: Vital signs in last 24 hours: Temp:  [97.7 F (36.5 C)-99 F (37.2 C)] 98.3 F (36.8 C) (10/22 1623) Pulse Rate:  [66-113] 79 (10/23 0500) Resp:  [8-30] 25 (10/23 0500) BP: (72-124)/(44-74) 99/66 (10/23 0500) SpO2:  [80 %-100 %] 90 % (10/23 0500) Weight change:  Last BM Date: 12/10/18  Intake/Output from previous day: 10/22 0701 - 10/23 0700 In: 2867.8 [I.V.:2695.7; IV Piggyback:172.1] Out: 800 [Urine:800]  PHYSICAL EXAM General appearance: alert, cooperative and no distress Resp: rhonchi bilaterally Cardio: regular rate and rhythm, S1, S2 normal, no murmur, click, rub or gallop GI: soft, non-tender; bowel sounds normal; no masses,  no organomegaly Extremities: extremities normal, atraumatic, no cyanosis or edema  Lab Results:  Results for orders placed or performed during the hospital encounter of 12/10/18 (from the past 48 hour(s))  POC occult blood, ED Provider will collect     Status: None   Collection Time: 12/10/18  7:22 AM  Result Value Ref Range   Fecal Occult Bld NEGATIVE NEGATIVE  Type and screen [facility]     Status: None   Collection Time: 12/10/18  7:46 AM  Result Value Ref Range   ABO/RH(D) O POS    Antibody Screen NEG    Sample Expiration      12/13/2018,2359 Performed at [facility], [address], [place], [place] [address]   Lactic acid, plasma     Status: Abnormal   Collection Time: 12/10/18  7:52 AM  Result Value Ref Range   Lactic Acid, Venous 2.2 (HH) 0.5 - 1.9 mmol/L    Comment: CRITICAL RESULT CALLED TO, READ BACK BY AND VERIFIED WITH: CARDWELL,L AT 8:20AM ON 12/10/18 BY [facility] Performed at [facility], [address], [place], [place] [address]   Urinalysis, Routine w reflex microscopic     Status: Abnormal   Collection Time:  12/10/18  8:32 AM  Result Value Ref Range   Color, Urine AMBER (A) YELLOW    Comment: BIOCHEMICALS MAY BE AFFECTED BY COLOR   APPearance TURBID (A) CLEAR   Specific Gravity, Urine 1.017 1.005 - 1.030   pH 5.0 5.0 - 8.0   Glucose, UA NEGATIVE NEGATIVE mg/dL   Hgb urine dipstick MODERATE (A) NEGATIVE   Bilirubin Urine NEGATIVE NEGATIVE   Ketones, ur NEGATIVE NEGATIVE mg/dL   Protein, ur 100 (A) NEGATIVE mg/dL   Nitrite NEGATIVE NEGATIVE   Leukocytes,Ua LARGE (A) NEGATIVE   RBC / HPF >50 (H) 0 - 5 RBC/hpf   WBC, UA >50 (H) 0 - 5 WBC/hpf   Bacteria, UA RARE (A) NONE SEEN   Squamous Epithelial / LPF 11-20 0 - 5   WBC Clumps PRESENT    Mucus PRESENT    Budding Yeast PRESENT    Uric Acid Crys, UA PRESENT    Non Squamous Epithelial 0-5 (A) NONE SEEN    Comment: Performed at [facility], [address], [place], [place] [address]  Urine rapid drug screen (hosp performed)     Status: Abnormal   Collection Time: 12/10/18  8:32 AM  Result Value Ref Range   Opiates POSITIVE (A) NONE DETECTED   Cocaine NONE DETECTED NONE DETECTED   Benzodiazepines POSITIVE (A) NONE DETECTED   Amphetamines NONE DETECTED NONE DETECTED   Tetrahydrocannabinol NONE DETECTED NONE DETECTED  Barbiturates NONE DETECTED NONE DETECTED    Comment: (NOTE) DRUG SCREEN FOR MEDICAL PURPOSES ONLY.  IF CONFIRMATION IS NEEDED FOR ANY PURPOSE, NOTIFY LAB WITHIN 5 DAYS. LOWEST DETECTABLE LIMITS FOR URINE DRUG SCREEN Drug Class                     Cutoff (ng/mL) Amphetamine and metabolites    1000 Barbiturate and metabolites    200 Benzodiazepine                 660 Tricyclics and metabolites     300 Opiates and metabolites        300 Cocaine and metabolites        300 THC                            50 Performed at [facility], [address], [place], [place] [address]   Urine culture     Status: Abnormal (Preliminary result)   Collection Time: 12/10/18  8:32 AM   Specimen: Urine, Clean Catch  Result Value Ref  Range   Specimen Description      URINE, CLEAN CATCH Performed at [facility], [address] [place], [place] [address]    Special Requests      NONE Performed at [facility], [address], [place], [place] [address]    Culture (A)     >=100,000 COLONIES/mL ESCHERICHIA COLI SUSCEPTIBILITIES TO FOLLOW Performed at [facility] [address] [place], [place] [address]    Report Status PENDING   Vitamin B12     Status: None   Collection Time: 12/10/18  9:00 AM  Result Value Ref Range   Vitamin B-12 845 180 - 914 pg/mL    Comment: (NOTE) This assay is not validated for testing neonatal or myeloproliferative syndrome specimens for Vitamin B12 levels. Performed at [facility], [address] [place], [place] [address]   Iron and TIBC     Status: Abnormal   Collection Time: 12/10/18  9:00 AM  Result Value Ref Range   Iron 11 (L) 28 - 170 ug/dL   TIBC 219 (L) 250 - 450 ug/dL   Saturation Ratios 5 (L) 10.4 - 31.8 %   UIBC 208 ug/dL    Comment: Performed at [facility], [address] [place], [place] [address]  Ferritin     Status: None   Collection Time: 12/10/18  9:00 AM  Result Value Ref Range   Ferritin 133 11 - 307 ng/mL    Comment: Performed at [facility], [address] [place], [place] [address]  Culture, blood (routine x 2)     Status: None (Preliminary result)   Collection Time: 12/10/18  9:50 AM   Specimen: Artery-; Blood  Result Value Ref Range   Specimen Description BLOOD LEFT HAND    Special Requests      BOTTLES DRAWN AEROBIC AND ANAEROBIC Blood Culture adequate volume   Culture      NO GROWTH < 24 HOURS Performed at [facility], [address] [place], [place] [address]    Report Status PENDING   Culture, blood (routine x 2)     Status: None (Preliminary result)   Collection Time: 12/10/18  9:51 AM   Specimen: Artery-; Blood  Result Value Ref Range   Specimen Description      BLOOD RIGHT HAND Performed at [facility], [address], [place], [place] [address]  Special Requests      BOTTLES DRAWN AEROBIC AND ANAEROBIC Blood Culture results may not be optimal due to an inadequate volume of blood received in culture bottles Performed at [facility], [address], [place], [place] [address]    Culture  Setup Time      IN BOTH AEROBIC AND ANAEROBIC BOTTLES GRAM NEGATIVE RODS Gram Stain Report Called to,Read Back By and Verified With: J HEARN,RN @2248  12/10/18 MKELLY CRITICAL RESULT CALLED TO, READ BACK BY AND VERIFIED WITH: H TETREAULT RN 12/11/18 0412 JDW    Culture      CULTURE REINCUBATED FOR BETTER GROWTH Performed at [facility], [address], [place], [place] [address]    Report Status PENDING   Blood Culture ID Panel (Reflexed)     Status: Abnormal   Collection Time: 12/10/18  9:51 AM  Result Value Ref Range   Enterococcus species NOT DETECTED NOT DETECTED   Listeria monocytogenes NOT DETECTED NOT DETECTED   Staphylococcus species NOT DETECTED NOT DETECTED   Staphylococcus aureus (BCID) NOT DETECTED NOT DETECTED   Streptococcus species NOT DETECTED NOT DETECTED   Streptococcus agalactiae NOT DETECTED NOT DETECTED   Streptococcus pneumoniae NOT DETECTED NOT DETECTED   Streptococcus pyogenes NOT DETECTED NOT DETECTED   Acinetobacter baumannii NOT DETECTED NOT DETECTED   Enterobacteriaceae species DETECTED (A) NOT DETECTED    Comment: Enterobacteriaceae represent a large family of gram-negative bacteria, not a single organism. CRITICAL RESULT CALLED TO, READ BACK BY AND VERIFIED WITH: H TETREAULT RN 12/11/18 0412 JDW    Enterobacter cloacae complex NOT DETECTED NOT DETECTED   Escherichia coli DETECTED (A) NOT DETECTED    Comment: CRITICAL RESULT CALLED TO, READ BACK BY AND VERIFIED WITH: H TETREAULT RN 12/11/18 0412 JDW    Klebsiella oxytoca NOT DETECTED NOT DETECTED   Klebsiella pneumoniae NOT DETECTED NOT DETECTED   Proteus species NOT DETECTED NOT DETECTED   Serratia marcescens NOT  DETECTED NOT DETECTED   Carbapenem resistance NOT DETECTED NOT DETECTED   Haemophilus influenzae NOT DETECTED NOT DETECTED   Neisseria meningitidis NOT DETECTED NOT DETECTED   Pseudomonas aeruginosa NOT DETECTED NOT DETECTED   Candida albicans NOT DETECTED NOT DETECTED   Candida glabrata NOT DETECTED NOT DETECTED   Candida krusei NOT DETECTED NOT DETECTED   Candida parapsilosis NOT DETECTED NOT DETECTED   Candida tropicalis NOT DETECTED NOT DETECTED    Comment: Performed at [facility] [address] [place], [place] [address]  SARS Coronavirus 2 by RT PCR (hospital order, performed in [facility] hospital lab) Nasopharyngeal Nasopharyngeal Swab     Status: None   Collection Time: 12/10/18 10:03 AM   Specimen: Nasopharyngeal Swab  Result Value Ref Range   SARS Coronavirus 2 NEGATIVE NEGATIVE    Comment: (NOTE) If result is NEGATIVE SARS-CoV-2 target nucleic acids are NOT DETECTED. The SARS-CoV-2 RNA is generally detectable in upper and lower  respiratory specimens during the acute phase of infection. The lowest  concentration of SARS-CoV-2 viral copies this assay can detect is 250  copies / mL. A negative result does not preclude SARS-CoV-2 infection  and should not be used as the sole basis for treatment or other  patient management decisions.  A negative result may occur with  improper specimen collection / handling, submission of specimen other  than nasopharyngeal swab, presence of viral mutation(s) within the  areas targeted by this assay, and inadequate number of viral copies  (<250 copies / mL). A negative result  must be combined with clinical  observations, patient history, and epidemiological information. If result is POSITIVE SARS-CoV-2 target nucleic acids are DETECTED. The SARS-CoV-2 RNA is generally detectable in upper and lower  respiratory specimens dur ing the acute phase of infection.  Positive  results are indicative of active infection with SARS-CoV-2.   Clinical  correlation with patient history and other diagnostic information is  necessary to determine patient infection status.  Positive results do  not rule out bacterial infection or co-infection with other viruses. If result is PRESUMPTIVE POSTIVE SARS-CoV-2 nucleic acids MAY BE PRESENT.   A presumptive positive result was obtained on the submitted specimen  and confirmed on repeat testing.  While 2019 novel coronavirus  (SARS-CoV-2) nucleic acids may be present in the submitted sample  additional confirmatory testing may be necessary for epidemiological  and / or clinical management purposes  to differentiate between  SARS-CoV-2 and other Sarbecovirus currently known to infect humans.  If clinically indicated additional testing with an alternate test  methodology [phone]) is advised. The SARS-CoV-2 RNA is generally  detectable in upper and lower respiratory sp ecimens during the acute  phase of infection. The expected result is Negative. Fact Sheet for Patients:  StrictlyIdeas.no Fact Sheet for Healthcare Providers: BankingDealers.co.za This test is not yet approved or cleared by the [place] FDA and has been authorized for detection and/or diagnosis of SARS-CoV-2 by FDA under an Emergency Use Authorization (EUA).  This EUA will remain in effect (meaning this test can be used) for the duration of the COVID-19 declaration under Section 564(b)(1) of the Act, 21 U.S.C. section 360bbb-3(b)(1), unless the authorization is terminated or revoked sooner. Performed at [facility], [address], [place], [place] [address]   Surgical pcr screen     Status: Abnormal   Collection Time: 12/10/18 11:22 AM   Specimen: Nasal Mucosa; Nasal Swab  Result Value Ref Range   MRSA, PCR NEGATIVE NEGATIVE   Staphylococcus aureus POSITIVE (A) NEGATIVE    Comment: RESULT CALLED TO, READ BACK BY AND VERIFIED WITH: SMITH,J ON 12/10/18 AT 1750 BY  LOY,C (NOTE) The Xpert SA Assay (FDA approved for NASAL specimens in patients 43 years of age and older), is one component of a comprehensive surveillance program. It is not intended to diagnose infection nor to guide or monitor treatment. Performed at [facility], [address] [place], [place] [address]   Lactic acid, plasma     Status: None   Collection Time: 12/10/18 12:33 PM  Result Value Ref Range   Lactic Acid, Venous 1.2 0.5 - 1.9 mmol/L    Comment: Performed at [facility], [address], [place], [place] [address]  Hemoglobin and hematocrit, blood     Status: Abnormal   Collection Time: 12/10/18  2:03 PM  Result Value Ref Range   Hemoglobin 9.7 (L) 12.0 - 15.0 g/dL   HCT 29.8 (L) 36.0 - 46.0 %    Comment: Performed at [facility], [address], [place], [place] [address]  HIV Antibody (routine testing w rflx)     Status: None   Collection Time: 12/10/18  2:03 PM  Result Value Ref Range   HIV Screen 4th Generation wRfx NON REACTIVE NON REACTIVE    Comment: Performed at [facility], [place] [address], [place], [place] [address]  Lactic acid, plasma     Status: None   Collection Time: 12/11/18  4:19 AM  Result Value Ref Range   Lactic Acid, Venous 0.9 0.5 -  1.9 mmol/L    Comment: Performed at [facility], [address], [place], [place] [address]  Magnesium     Status: Abnormal   Collection Time: 12/11/18  4:19 AM  Result Value Ref Range   Magnesium 1.6 (L) 1.7 - 2.4 mg/dL    Comment: Performed at [facility], [address], [place], [place] [address]  Comprehensive metabolic panel     Status: Abnormal   Collection Time: 12/11/18  4:19 AM  Result Value Ref Range   Sodium 138 135 - 145 mmol/L   Potassium 3.6 3.5 - 5.1 mmol/L   Chloride 110 98 - 111 mmol/L   CO2 17 (L) 22 - 32 mmol/L   Glucose, Bld 148 (H) 70 - 99 mg/dL   BUN 32 (H) 6 - 20 mg/dL   Creatinine, Ser 2.41 (H) 0.44 - 1.00 mg/dL    Comment: DELTA CHECK NOTED   Calcium 7.3 (L) 8.9 - 10.3 mg/dL    Total Protein 5.4 (L) 6.5 - 8.1 g/dL   Albumin 2.8 (L) 3.5 - 5.0 g/dL   AST 15 15 - 41 U/L   ALT 12 0 - 44 U/L   Alkaline Phosphatase 28 (L) 38 - 126 U/L   Total Bilirubin 1.1 0.3 - 1.2 mg/dL   GFR calc non Af Amer 23 (L) >60 mL/min   GFR calc Af Amer 26 (L) >60 mL/min   Anion gap 11 5 - 15    Comment: Performed at [facility], [address] [place], [place] [address]  CBC     Status: Abnormal   Collection Time: 12/11/18  4:19 AM  Result Value Ref Range   WBC 4.3 4.0 - 10.5 K/uL   RBC 3.04 (L) 3.87 - 5.11 MIL/uL   Hemoglobin 9.5 (L) 12.0 - 15.0 g/dL   HCT 28.4 (L) 36.0 - 46.0 %   MCV 93.4 80.0 - 100.0 fL   MCH 31.3 26.0 - 34.0 pg   MCHC 33.5 30.0 - 36.0 g/dL   RDW 13.9 11.5 - 15.5 %   Platelets 79 (L) 150 - 400 K/uL    Comment: SPECIMEN CHECKED FOR CLOTS Immature Platelet Fraction may be clinically indicated, consider ordering this additional test [id number] CONSISTENT WITH PREVIOUS RESULT    nRBC 0.0 0.0 - 0.2 %    Comment: Performed at [facility], [address], [place], [place] [address]  Reticulocytes     Status: Abnormal   Collection Time: 12/11/18  4:19 AM  Result Value Ref Range   Retic Ct Pct 1.3 0.4 - 3.1 %   RBC. 3.06 (L) 3.87 - 5.11 MIL/uL   Retic Count, Absolute 39.2 19.0 - 186.0 K/uL   Immature Retic Fract 13.7 2.3 - 15.9 %    Comment: Performed at [facility], [address] [place], [place] [address]  Folate     Status: Abnormal   Collection Time: 12/11/18 10:31 AM  Result Value Ref Range   Folate 5.6 (L) >5.9 ng/mL    Comment: Performed at [facility], [address], [place], [place] [address]  CBC     Status: Abnormal   Collection Time: 12/12/18  4:13 AM  Result Value Ref Range   WBC 4.3 4.0 - 10.5 K/uL   RBC 2.77 (L) 3.87 - 5.11 MIL/uL   Hemoglobin 8.5 (L) 12.0 - 15.0 g/dL   HCT 25.9 (L) 36.0 - 46.0 %   MCV 93.5 80.0 - 100.0 fL   MCH 30.7 26.0 - 34.0 pg   MCHC  32.8 30.0 - 36.0 g/dL   RDW 13.8 11.5 - 15.5 %   Platelets 75 (L) 150 - 400 K/uL     Comment: Immature Platelet Fraction may be clinically indicated, consider ordering this additional test [id number] CONSISTENT WITH PREVIOUS RESULT    nRBC 0.0 0.0 - 0.2 %    Comment: Performed at [facility], [address] [place], [place] [address]  Basic metabolic panel     Status: Abnormal   Collection Time: 12/12/18  4:13 AM  Result Value Ref Range   Sodium 137 135 - 145 mmol/L   Potassium 3.4 (L) 3.5 - 5.1 mmol/L   Chloride 107 98 - 111 mmol/L   CO2 23 22 - 32 mmol/L   Glucose, Bld 129 (H) 70 - 99 mg/dL   BUN 20 6 - 20 mg/dL   Creatinine, Ser 1.53 (H) 0.44 - 1.00 mg/dL   Calcium 7.9 (L) 8.9 - 10.3 mg/dL   GFR calc non Af Amer 40 (L) >60 mL/min   GFR calc Af Amer 46 (L) >60 mL/min   Anion gap 7 5 - 15    Comment: Performed at [facility], [address] [place], [place] [address]  Magnesium     Status: Abnormal   Collection Time: 12/12/18  4:13 AM  Result Value Ref Range   Magnesium 1.6 (L) 1.7 - 2.4 mg/dL    Comment: Performed at [facility], [address], [place], [place] [address]    ABGS No results for input(s): PHART, PO2ART, TCO2, HCO3 in the last 72 hours.  Invalid input(s): PCO2 CULTURES Recent Results (from the past 240 hour(s))  Urine culture     Status: Abnormal (Preliminary result)   Collection Time: 12/10/18  8:32 AM   Specimen: Urine, Clean Catch  Result Value Ref Range Status   Specimen Description   Final    URINE, CLEAN CATCH Performed at [facility], [address] [place], [place] [address]    Special Requests   Final    NONE Performed at [facility], [address], [place], [place] [address]    Culture (A)  Final    >=100,000 COLONIES/mL ESCHERICHIA COLI SUSCEPTIBILITIES TO FOLLOW Performed at [facility] [address], [place], [place] [address]    Report Status PENDING  Incomplete  Culture, blood (routine x 2)     Status: None (Preliminary result)   Collection Time: 12/10/18  9:50 AM   Specimen: Artery-; Blood   Result Value Ref Range Status   Specimen Description BLOOD LEFT HAND  Final   Special Requests   Final    BOTTLES DRAWN AEROBIC AND ANAEROBIC Blood Culture adequate volume   Culture   Final    NO GROWTH < 24 HOURS Performed at [facility], [address], [place], [place] [address]    Report Status PENDING  Incomplete  Culture, blood (routine x 2)     Status: None (Preliminary result)   Collection Time: 12/10/18  9:51 AM   Specimen: Artery-; Blood  Result Value Ref Range Status   Specimen Description   Final    BLOOD RIGHT HAND Performed at [facility], [address] [place], [place] [address]    Special Requests   Final    BOTTLES DRAWN AEROBIC AND ANAEROBIC Blood Culture results may not be optimal due to an inadequate volume of blood received in culture bottles Performed at [facility], [address] [place], [place] [address]    Culture  Setup Time  Final    IN BOTH AEROBIC AND ANAEROBIC BOTTLES GRAM NEGATIVE RODS Gram Stain Report Called to,Read Back By and Verified With: J HEARN,RN @2248  12/10/18 MKELLY CRITICAL RESULT CALLED TO, READ BACK BY AND VERIFIED WITH: H TETREAULT RN 12/11/18 0412 JDW    Culture   Final    CULTURE REINCUBATED FOR BETTER GROWTH Performed at [facility], [place] [address] [place] [address], [place], [place] [address]    Report Status PENDING  Incomplete  Blood Culture ID Panel (Reflexed)     Status: Abnormal   Collection Time: 12/10/18  9:51 AM  Result Value Ref Range Status   Enterococcus species NOT DETECTED NOT DETECTED Final   Listeria monocytogenes NOT DETECTED NOT DETECTED Final   Staphylococcus species NOT DETECTED NOT DETECTED Final   Staphylococcus aureus (BCID) NOT DETECTED NOT DETECTED Final   Streptococcus species NOT DETECTED NOT DETECTED Final   Streptococcus agalactiae NOT DETECTED NOT DETECTED Final   Streptococcus pneumoniae NOT DETECTED NOT DETECTED Final   Streptococcus pyogenes NOT DETECTED NOT DETECTED Final   Acinetobacter  baumannii NOT DETECTED NOT DETECTED Final   Enterobacteriaceae species DETECTED (A) NOT DETECTED Final    Comment: Enterobacteriaceae represent a large family of gram-negative bacteria, not a single organism. CRITICAL RESULT CALLED TO, READ BACK BY AND VERIFIED WITH: H TETREAULT RN 12/11/18 0412 JDW    Enterobacter cloacae complex NOT DETECTED NOT DETECTED Final   Escherichia coli DETECTED (A) NOT DETECTED Final    Comment: CRITICAL RESULT CALLED TO, READ BACK BY AND VERIFIED WITH: H TETREAULT RN 12/11/18 0412 JDW    Klebsiella oxytoca NOT DETECTED NOT DETECTED Final   Klebsiella pneumoniae NOT DETECTED NOT DETECTED Final   Proteus species NOT DETECTED NOT DETECTED Final   Serratia marcescens NOT DETECTED NOT DETECTED Final   Carbapenem resistance NOT DETECTED NOT DETECTED Final   Haemophilus influenzae NOT DETECTED NOT DETECTED Final   Neisseria meningitidis NOT DETECTED NOT DETECTED Final   Pseudomonas aeruginosa NOT DETECTED NOT DETECTED Final   Candida albicans NOT DETECTED NOT DETECTED Final   Candida glabrata NOT DETECTED NOT DETECTED Final   Candida krusei NOT DETECTED NOT DETECTED Final   Candida parapsilosis NOT DETECTED NOT DETECTED Final   Candida tropicalis NOT DETECTED NOT DETECTED Final    Comment: Performed at [facility], [place] [address], [place], [place] [address]  SARS Coronavirus 2 by RT PCR (hospital order, performed in [facility] hospital lab) Nasopharyngeal Nasopharyngeal Swab     Status: None   Collection Time: 12/10/18 10:03 AM   Specimen: Nasopharyngeal Swab  Result Value Ref Range Status   SARS Coronavirus 2 NEGATIVE NEGATIVE Final    Comment: (NOTE) If result is NEGATIVE SARS-CoV-2 target nucleic acids are NOT DETECTED. The SARS-CoV-2 RNA is generally detectable in upper and lower  respiratory specimens during the acute phase of infection. The lowest  concentration of SARS-CoV-2 viral copies this assay can detect is 250  copies / mL. A negative  result does not preclude SARS-CoV-2 infection  and should not be used as the sole basis for treatment or other  patient management decisions.  A negative result may occur with  improper specimen collection / handling, submission of specimen other  than nasopharyngeal swab, presence of viral mutation(s) within the  areas targeted by this assay, and inadequate number of viral copies  (<250 copies / mL). A negative result must be combined with clinical  observations, patient history, and epidemiological information. If result is POSITIVE SARS-CoV-2 target  nucleic acids are DETECTED. The SARS-CoV-2 RNA is generally detectable in upper and lower  respiratory specimens dur ing the acute phase of infection.  Positive  results are indicative of active infection with SARS-CoV-2.  Clinical  correlation with patient history and other diagnostic information is  necessary to determine patient infection status.  Positive results do  not rule out bacterial infection or co-infection with other viruses. If result is PRESUMPTIVE POSTIVE SARS-CoV-2 nucleic acids MAY BE PRESENT.   A presumptive positive result was obtained on the submitted specimen  and confirmed on repeat testing.  While 2019 novel coronavirus  (SARS-CoV-2) nucleic acids may be present in the submitted sample  additional confirmatory testing may be necessary for epidemiological  and / or clinical management purposes  to differentiate between  SARS-CoV-2 and other Sarbecovirus currently known to infect humans.  If clinically indicated additional testing with an alternate test  methodology [phone]) is advised. The SARS-CoV-2 RNA is generally  detectable in upper and lower respiratory sp ecimens during the acute  phase of infection. The expected result is Negative. Fact Sheet for Patients:  StrictlyIdeas.no Fact Sheet for Healthcare Providers: BankingDealers.co.za This test is not yet  approved or cleared by the [place] FDA and has been authorized for detection and/or diagnosis of SARS-CoV-2 by FDA under an Emergency Use Authorization (EUA).  This EUA will remain in effect (meaning this test can be used) for the duration of the COVID-19 declaration under Section 564(b)(1) of the Act, 21 U.S.C. section 360bbb-3(b)(1), unless the authorization is terminated or revoked sooner. Performed at [facility], [address], [place], [place] [address]   Surgical pcr screen     Status: Abnormal   Collection Time: 12/10/18 11:22 AM   Specimen: Nasal Mucosa; Nasal Swab  Result Value Ref Range Status   MRSA, PCR NEGATIVE NEGATIVE Final   Staphylococcus aureus POSITIVE (A) NEGATIVE Final    Comment: RESULT CALLED TO, READ BACK BY AND VERIFIED WITH: SMITH,J ON 12/10/18 AT 1750 BY LOY,C (NOTE) The Xpert SA Assay (FDA approved for NASAL specimens in patients 63 years of age and older), is one component of a comprehensive surveillance program. It is not intended to diagnose infection nor to guide or monitor treatment. Performed at [facility], [address] [place], [place] [address]    Studies/Results: Ct Abdomen Pelvis Wo Contrast  Result Date: 12/10/2018 CLINICAL DATA:  48 year old female with weakness dizziness. History of kidney stones. History of Crohn's disease and rectovaginal fistula. EXAM: CT CHEST, ABDOMEN AND PELVIS WITHOUT CONTRAST TECHNIQUE: Multidetector CT imaging of the chest, abdomen and pelvis was performed following the standard protocol without IV contrast. COMPARISON:  Chest radiograph dated 12/10/2018 and CT of the abdomen pelvis dated 07/23/2018. FINDINGS: Evaluation of this exam is limited in the absence of intravenous contrast. CT CHEST FINDINGS Cardiovascular: There is no cardiomegaly or pericardial effusion. The thoracic aorta and the central pulmonary arteries are unremarkable. Mediastinum/Nodes: No hilar or mediastinal adenopathy. The esophagus and  the thyroid gland are grossly unremarkable. No mediastinal fluid collection. Lungs/Pleura: There is diffuse interstitial and interlobular septal thickening consistent with edema. Bilateral, upper lobe predominant, patchy airspace opacities most consistent with multifocal pneumonia. Trace bilateral pleural effusions noted. There is no pneumothorax. The central airways are patent. Musculoskeletal: No chest wall mass or suspicious bone lesions identified. CT ABDOMEN PELVIS FINDINGS No intra-abdominal free air or free fluid. Hepatobiliary: The liver is unremarkable. No intrahepatic biliary ductal dilatation. The gallbladder is unremarkable. Pancreas: Unremarkable. No pancreatic ductal dilatation  or surrounding inflammatory changes. Spleen: Splenomegaly measuring approximately 19 cm in craniocaudal length. Adrenals/Urinary Tract: The adrenal glands are unremarkable. Mild bilateral renal parenchyma atrophy. There is no hydronephrosis or nephrolithiasis on either side. The visualized ureters and urinary bladder appear unremarkable. Stomach/Bowel: Several small scattered colonic diverticula without active inflammatory changes. There is no bowel obstruction or active inflammation. Normal appendix. Vascular/Lymphatic: The abdominal aorta and IVC are grossly unremarkable on this noncontrast CT. No portal venous gas. There is no adenopathy. Reproductive: The uterus is anteverted and grossly unremarkable. No adnexal masses. Other: A 2.1 x 1.5 cm nodular density posterior to the rectum likely represents scarring related to prior inflammatory changes or collection. No drainable fluid identified. No air is seen within this area. Musculoskeletal: No acute or significant osseous findings. IMPRESSION: 1. Pulmonary edema with superimposed multifocal pneumonia. Clinical correlation and follow-up to resolution recommended. 2. No acute intra-abdominal or pelvic pathology. No bowel obstruction or active inflammation. Normal appendix. 3.  Splenomegaly. 4. Posterior perirectal scarring.  No drainable fluid collection. Electronically Signed   By: Anner Crete M.D.   On: 12/10/2018 10:58   Ct Chest Wo Contrast  Result Date: 12/10/2018 CLINICAL DATA:  48 year old female with weakness dizziness. History of kidney stones. History of Crohn's disease and rectovaginal fistula. EXAM: CT CHEST, ABDOMEN AND PELVIS WITHOUT CONTRAST TECHNIQUE: Multidetector CT imaging of the chest, abdomen and pelvis was performed following the standard protocol without IV contrast. COMPARISON:  Chest radiograph dated 12/10/2018 and CT of the abdomen pelvis dated 07/23/2018. FINDINGS: Evaluation of this exam is limited in the absence of intravenous contrast. CT CHEST FINDINGS Cardiovascular: There is no cardiomegaly or pericardial effusion. The thoracic aorta and the central pulmonary arteries are unremarkable. Mediastinum/Nodes: No hilar or mediastinal adenopathy. The esophagus and the thyroid gland are grossly unremarkable. No mediastinal fluid collection. Lungs/Pleura: There is diffuse interstitial and interlobular septal thickening consistent with edema. Bilateral, upper lobe predominant, patchy airspace opacities most consistent with multifocal pneumonia. Trace bilateral pleural effusions noted. There is no pneumothorax. The central airways are patent. Musculoskeletal: No chest wall mass or suspicious bone lesions identified. CT ABDOMEN PELVIS FINDINGS No intra-abdominal free air or free fluid. Hepatobiliary: The liver is unremarkable. No intrahepatic biliary ductal dilatation. The gallbladder is unremarkable. Pancreas: Unremarkable. No pancreatic ductal dilatation or surrounding inflammatory changes. Spleen: Splenomegaly measuring approximately 19 cm in craniocaudal length. Adrenals/Urinary Tract: The adrenal glands are unremarkable. Mild bilateral renal parenchyma atrophy. There is no hydronephrosis or nephrolithiasis on either side. The visualized ureters and  urinary bladder appear unremarkable. Stomach/Bowel: Several small scattered colonic diverticula without active inflammatory changes. There is no bowel obstruction or active inflammation. Normal appendix. Vascular/Lymphatic: The abdominal aorta and IVC are grossly unremarkable on this noncontrast CT. No portal venous gas. There is no adenopathy. Reproductive: The uterus is anteverted and grossly unremarkable. No adnexal masses. Other: A 2.1 x 1.5 cm nodular density posterior to the rectum likely represents scarring related to prior inflammatory changes or collection. No drainable fluid identified. No air is seen within this area. Musculoskeletal: No acute or significant osseous findings. IMPRESSION: 1. Pulmonary edema with superimposed multifocal pneumonia. Clinical correlation and follow-up to resolution recommended. 2. No acute intra-abdominal or pelvic pathology. No bowel obstruction or active inflammation. Normal appendix. 3. Splenomegaly. 4. Posterior perirectal scarring.  No drainable fluid collection. Electronically Signed   By: Anner Crete M.D.   On: 12/10/2018 10:58   Dg Chest Portable 1 View  Result Date: 12/10/2018 CLINICAL DATA:  Shortness of breath. EXAM:  PORTABLE CHEST 1 VIEW COMPARISON:  December 19, 2006. FINDINGS: The heart size and mediastinal contours are within normal limits. No pneumothorax or pleural effusion is noted. Mildly increased interstitial densities are noted throughout both lungs concerning for possible pulmonary edema. The visualized skeletal structures are unremarkable. IMPRESSION: Mildly increased interstitial densities are noted throughout both lungs concerning for possible pulmonary edema. Electronically Signed   By: Marijo Conception M.D.   On: 12/10/2018 09:34   [place] Ekg Site Rite  Result Date: 12/11/2018 If Site Rite image not attached, placement could not be confirmed due to current cardiac rhythm.   Medications:  Prior to Admission:  Medications Prior to  Admission  Medication Sig Dispense Refill Last Dose  . ALPRAZolam (XANAX) 1 MG tablet Take 1 mg by mouth 2 (two) times daily.   12/09/2018 at Unknown time  . benazepril-hydrochlorthiazide (LOTENSIN HCT) 20-12.5 MG tablet Take 1 tablet by mouth daily. 90 tablet 3 12/09/2018 at Unknown time  . Cholecalciferol (VITAMIN D3) 250 MCG (10000 UT) capsule Take 10,000 Units by mouth daily.   12/09/2018 at Unknown time  . cyanocobalamin (,VITAMIN B-12,) 1000 MCG/ML injection INJECT 1ML INTO THE SKIN EVERY 14 DAYS (Patient taking differently: Every 10 days) 6 mL 11 Past Month at Unknown time  . cyclobenzaprine (FLEXERIL) 5 MG tablet TAKE 1 TABLET (5 MG TOTAL) BY MOUTH AT BEDTIME AS NEEDED FOR MUSCLE SPASMS. 30 tablet 1 12/09/2018 at Unknown time  . diclofenac sodium (VOLTAREN) 1 % GEL Apply 1 application topically 4 (four) times daily. 100 g 3 12/09/2018 at Unknown time  . HYDROcodone-acetaminophen (NORCO) 10-325 MG tablet Take 1 tablet by mouth every 8 (eight) hours as needed for severe pain. 90 tablet 0 12/09/2018 at Unknown time  . ketorolac (TORADOL) 10 MG tablet Take 1 tablet (10 mg total) by mouth every 6 (six) hours as needed. for pain 20 tablet 0   . lamoTRIgine (LAMICTAL) 200 MG tablet Take 1 tablet by mouth daily.   12/09/2018 at Unknown time  . norethindrone (AYGESTIN) 5 MG tablet Take 1 tablet (5 mg total) by mouth daily. 90 tablet 3 12/09/2018 at Unknown time  . pantoprazole (PROTONIX) 40 MG tablet 1 PO 30 MINUTES PRIOR TO YOUR FIRST MEAL QD 90 tablet 3 12/09/2018 at Unknown time  . potassium chloride (K-DUR) 10 MEQ tablet TAKE 1 TABLET BY MOUTH EVERY DAY 90 tablet 3 12/09/2018 at Unknown time  . sertraline (ZOLOFT) 100 MG tablet Take 200 mg by mouth daily.    12/09/2018 at Unknown time  . sodium chloride 0.9 % SOLN 250 mL with vedolizumab 300 MG SOLR 300 mg Inject 300 mg into the vein. LOADING DOSE THEN Q8 WEEKS     . SYRINGE-NEEDLE, DISP, 3 ML (B-D SYRINGE/NEEDLE 3CC/25GX5/8) 25G X 5/8" 3 ML MISC  Use to administer B12 injection every 14 days 24 each 0   . varenicline (CHANTIX CONTINUING MONTH PAK) 1 MG tablet Take 1 tablet (1 mg total) by mouth 2 (two) times daily. 60 tablet 4 12/09/2018 at Unknown time  . vedolizumab (ENTYVIO) 300 MG injection Inject into the vein. Every 8 weeks     . ciprofloxacin (CIPRO) 500 MG tablet Take 1 tablet (500 mg total) by mouth 2 (two) times daily. (Patient not taking: Reported on 12/10/2018) 28 tablet 1 Completed Course at Unknown time  . fluconazole (DIFLUCAN) 150 MG tablet Take 1 tablet (150 mg total) by mouth every three (3) days as needed. (Patient not taking: Reported on 12/10/2018) 2 tablet 1  Not Taking at Unknown time  . ondansetron (ZOFRAN) 4 MG tablet Take 1 tablet (4 mg total) by mouth every 8 (eight) hours as needed for nausea or vomiting. (Patient not taking: Reported on 12/10/2018) 20 tablet 0 Not Taking at Unknown time  . promethazine (PHENERGAN) 25 MG tablet Take 1 tablet (25 mg total) by mouth 2 (two) times daily as needed for nausea. (Patient not taking: Reported on 12/10/2018) 60 tablet 0 Not Taking at Unknown time  . varenicline (CHANTIX STARTING MONTH PAK) 0.5 MG X 11 & 1 MG X 42 tablet Take one 0.5 mg tablet by mouth once daily for 3 days, then increase to one 0.5 mg tablet twice daily for 4 days, then increase to one 1 mg tablet twice daily. (Patient not taking: Reported on 12/10/2018) 53 tablet 0 Completed Course at Unknown time   Scheduled: . ALPRAZolam  0.5 mg Oral BID  . chlorhexidine  15 mL Mouth Rinse BID  . Chlorhexidine Gluconate Cloth  6 each Topical Daily  . fluticasone  1 spray Each Nare Daily  . lamoTRIgine  200 mg Oral QHS  . mouth rinse  15 mL Mouth Rinse q12n4p  . mupirocin ointment  1 application Nasal BID  . pantoprazole (PROTONIX) IV  40 mg Intravenous Q12H  . sertraline  200 mg Oral QHS  . sodium chloride flush  10-40 mL Intracatheter Q12H  . varenicline  1 mg Oral BID   Continuous: . sodium chloride 250 mL  (12/10/18 1203)  . sodium chloride    . lactated ringers 100 mL/hr at 12/12/18 0512  . norepinephrine (LEVOPHED) Adult infusion 3 mcg/min (12/12/18 0512)  . piperacillin-tazobactam (ZOSYN)  IV 12.5 mL/hr at 12/12/18 6144   RXV:[id number] arterial line **AND** sodium chloride, acetaminophen **OR** acetaminophen, HYDROmorphone (DILAUDID) injection, ondansetron **OR** ondansetron (ZOFRAN) IV, sodium chloride flush  Assesment: She was admitted with septic shock.  She has E. coli in the blood and in the urine.  She is improving.  She is still requiring pressor support.  She was severely dehydrated on admission and that has improved with fluid resuscitation  She had acute kidney injury and her renal function is improving with fluid resuscitation and improvement in her blood pressure  She has pneumonia presumably from aspiration which is being treated  She has Crohn's disease and is on medication so she is immunocompromised  She has bipolar disease and she says she feels "antsy"   Active Problems:   Sepsis ([place])    Plan: Continue treatments.  Continue trying to reduce Levophed.  Continue antibiotics.    LOS: 2 days   Alonza Bogus 12/12/2018, 7:05 AM

## 2018-12-13 LAB — CBC
HCT: 25 % — ABNORMAL LOW (ref 36.0–46.0)
Hemoglobin: 8.2 g/dL — ABNORMAL LOW (ref 12.0–15.0)
MCH: 30.6 pg (ref 26.0–34.0)
MCHC: 32.8 g/dL (ref 30.0–36.0)
MCV: 93.3 fL (ref 80.0–100.0)
Platelets: 117 10*3/uL — ABNORMAL LOW (ref 150–400)
RBC: 2.68 MIL/uL — ABNORMAL LOW (ref 3.87–5.11)
RDW: 13.5 % (ref 11.5–15.5)
WBC: 4.8 10*3/uL (ref 4.0–10.5)
nRBC: 0 % (ref 0.0–0.2)

## 2018-12-13 LAB — BASIC METABOLIC PANEL
Anion gap: 10 (ref 5–15)
BUN: 16 mg/dL (ref 6–20)
CO2: 26 mmol/L (ref 22–32)
Calcium: 8.3 mg/dL — ABNORMAL LOW (ref 8.9–10.3)
Chloride: 105 mmol/L (ref 98–111)
Creatinine, Ser: 1.27 mg/dL — ABNORMAL HIGH (ref 0.44–1.00)
GFR calc Af Amer: 57 mL/min — ABNORMAL LOW (ref >60)
GFR calc non Af Amer: 50 mL/min — ABNORMAL LOW (ref >60)
Glucose, Bld: 112 mg/dL — ABNORMAL HIGH (ref 70–99)
Potassium: 3.6 mmol/L (ref 3.5–5.1)
Sodium: 141 mmol/L (ref 135–145)

## 2018-12-13 LAB — CULTURE, BLOOD (ROUTINE X 2)

## 2018-12-13 LAB — MAGNESIUM: Magnesium: 1.4 mg/dL — ABNORMAL LOW (ref 1.7–2.4)

## 2018-12-13 MED ORDER — POTASSIUM CHLORIDE CRYS ER 20 MEQ PO TBCR
40.0000 meq | EXTENDED_RELEASE_TABLET | Freq: Once | ORAL | Status: AC
  Administered 2018-12-13: 40 meq via ORAL
  Filled 2018-12-13: qty 2

## 2018-12-13 MED ORDER — MAGNESIUM SULFATE 2 GM/50ML IV SOLN
2.0000 g | Freq: Once | INTRAVENOUS | Status: AC
  Administered 2018-12-13: 2 g via INTRAVENOUS
  Filled 2018-12-13: qty 50

## 2018-12-13 MED ORDER — PANTOPRAZOLE SODIUM 40 MG PO TBEC
40.0000 mg | DELAYED_RELEASE_TABLET | Freq: Every day | ORAL | Status: DC
  Administered 2018-12-14 – 2018-12-15 (×2): 40 mg via ORAL
  Filled 2018-12-13 (×2): qty 1

## 2018-12-13 MED ORDER — FERROUS SULFATE 325 (65 FE) MG PO TABS
325.0000 mg | ORAL_TABLET | Freq: Every day | ORAL | Status: DC
  Administered 2018-12-13 – 2018-12-15 (×3): 325 mg via ORAL
  Filled 2018-12-13 (×3): qty 1

## 2018-12-13 NOTE — Progress Notes (Signed)
PROGRESS NOTE    [name] [name]  OBS:[id number] DOB: 1970/03/17 DOA: 12/10/2018 PCP: Cassandria Anger, MD   Brief Narrative:  Per HPI: [name] [name] a 48 y.o.femalewith medical history significant forbipolar affective disorder, depression/anxiety, hypertension, GERD, and Crohn's disease who began having nausea and vomiting that started on 10/17. She has been vomiting daily and started to feel weak, dizzy, and lightheaded. She feels as though she passed a kidney stone last week as well. She denies any chest pain, shortness of breath, fevers or chills. She has been taking her medications otherwise as usual. She denies any coughing.  10/22:Patient appears to be doing much better this morning and is currently on nasal cannula oxygen. Her norepinephrine is being weaned. She appears to have E. coli bacteremia likely secondary to UTI. She is eager to have a diet. Will need PICC line placement today and femoral line removal this afternoon as it would be a 24 hours.  10/23: Patient overall doing much better this morning.  She is on low-dose norepinephrine which is currently being weaned.  She is noted to have E. coli bacteremia related to urinary tract infection.  Continue IV Zosyn.  10/24: Patient doing well this morning, but remains on low-dose norepinephrine which is still being tapered.  Continues on IV Zosyn with no acute events noted overnight.  She is eager to go home.  Assessment & Plan:   Active Problems:   Sepsis ([place])   Septic shock secondary to multifocal pneumonia with likely aspirationalong with E. coli bacteremia likely secondary to UTI -Continue on Zosyn -MRSA naresnegative and vancomycin discontinued 10/22 -Continue IV fluid with LR until norepinephrine weaned -Monitor CBCin a.m. with lactic acid that has down trended -Blood cultureswith E. coli bacteremia likely related to UTI with urine cultures pending -IV LR for maintenance to continue  Acute  hypoxemic respiratory failure secondary to multifocal pneumonia along with pulmonary edema-improving -Continue nasal cannula oxygen -Appreciate pulmonology evaluation -We will need continuance of IV fluid for now given septic shock and soft blood pressure readings  AKI secondary to sepsis-resolved -Baseline creatinine 1.3-1.5 noted on prior lab work -Creatinine now 1.27 and pretty much at baseline -Continue IV fluids and monitor I's and O's -Avoid nephrotoxic agents -A.m. renal panel  Intractable nausea and vomiting-resolved -GERD uncertain etiology with possible viral gastroenteritis and/or pneumonia -CT of abdomen with no acute findings -Zofran as needed  Worsening anemia, currently stable -Noted iron deficiency on anemia panel as well as low folate -IV Feraheme given on 47/65 as well as folic acid supplementation that is started -We will start ferrous sulfate -Stool FOBT negative -Baseline hemoglobin noted to be 14.2 previously -We will check anemia panel -Repeat H&H and transfuse if hemoglobin less than 7  Thrombocytopenialikely secondary to splenomegaly-stable -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  Crohn's disease -Hold Entyvio  Chronic pain -Hold oral agents in order IV pain medications as needed  Anxiety/depression -Continue home medications  Bipolar affective disorder -Continue home medications  GERD -PPI to oral  Hypertension -Hold home blood pressure agents on account of shock physiology -Wean norepinephrine as tolerated   DVT prophylaxis:SCDs Code Status:Full Family Communication:Discussed with husband at bedside on 10/21 Disposition Plan:Continue to wean norepinephrine and maintain on Zosyn as ordered for aspiration pneumonia and follow E. coli sensitivities.     Consultants:  Pulmonology  Procedures:  Central venous line to  right femoral region on admission  PICC line placement 10/22  Antimicrobials:  Anti-infectives (From admission, onward)   Start     Dose/Rate Route Frequency Ordered Stop   12/12/18 1200  vancomycin (VANCOCIN) IVPB 1000 mg/200 mL premix  Status:  Discontinued     1,000 mg 200 mL/hr over 60 Minutes Intravenous Every 48 hours 12/10/18 1226 12/11/18 0759   12/11/18 1100  piperacillin-tazobactam (ZOSYN) IVPB 3.375 g     3.375 g 12.5 mL/hr over 240 Minutes Intravenous Every 8 hours 12/11/18 0902     12/11/18 0900  piperacillin-tazobactam (ZOSYN) IVPB 3.375 g  Status:  Discontinued     3.375 g 12.5 mL/hr over 240 Minutes Intravenous Every 8 hours 12/11/18 0859 12/11/18 0901   12/11/18 0000  piperacillin-tazobactam (ZOSYN) IVPB 3.375 g  Status:  Discontinued     3.375 g 12.5 mL/hr over 240 Minutes Intravenous Every 12 hours 12/10/18 1245 12/11/18 0859   12/10/18 1300  vancomycin (VANCOCIN) IVPB 750 mg/150 ml premix     750 mg 150 mL/hr over 60 Minutes Intravenous  Once 12/10/18 1136 12/10/18 1452   12/10/18 1200  vancomycin (VANCOCIN) IVPB 1000 mg/200 mL premix     1,000 mg 200 mL/hr over 60 Minutes Intravenous  Once 12/10/18 1136 12/10/18 1322   12/10/18 1145  piperacillin-tazobactam (ZOSYN) IVPB 3.375 g     3.375 g 100 mL/hr over 30 Minutes Intravenous  Once 12/10/18 1139 12/10/18 1246   12/10/18 0930  levofloxacin (LEVAQUIN) IVPB 750 mg     750 mg 100 mL/hr over 90 Minutes Intravenous  Once 12/10/18 0916 12/10/18 1204       Subjective: Patient seen and evaluated today with no new acute complaints or concerns. No acute concerns or events noted overnight.  Objective: Vitals:   12/13/18 0913 12/13/18 0914 12/13/18 0915 12/13/18 0916  BP:   105/62   Pulse: 100 (!) 101 (!) 103 98  Resp: (!) 21 16 14  (!) 28  Temp:      TempSrc:      SpO2: 97% 96% 96% 95%  Weight:      Height:        Intake/Output Summary  (Last 24 hours) at 12/13/2018 1100 Last data filed at 12/13/2018 0952 Gross per 24 hour  Intake 3541.36 ml  Output 250 ml  Net 3291.36 ml   Filed Weights   12/11/18 0500 12/12/18 0500 12/13/18 0510  Weight: 72.9 kg 72.5 kg 76 kg    Examination:  General exam: Appears calm and comfortable  Respiratory system: Clear to auscultation. Respiratory effort normal.  On 2 L nasal cannula oxygen. Cardiovascular system: S1 & S2 heard, RRR. No JVD, murmurs, rubs, gallops or clicks. No pedal edema. Gastrointestinal system: Abdomen is nondistended, soft and nontender. No organomegaly or masses felt. Normal bowel sounds heard. Central nervous system: Alert and oriented. No focal neurological deficits. Extremities: Symmetric 5 x 5 power. Skin: No rashes, lesions or ulcers Psychiatry: Judgement and insight appear normal. Mood & affect appropriate.     Data Reviewed: I have personally reviewed following labs and imaging studies  CBC: Recent Labs  Lab 12/10/18 0619 12/10/18 1403 12/11/18 0419 12/12/18 0413 12/13/18 0415  WBC 4.0  --  4.3 4.3 4.8  NEUTROABS 3.2  --   --   --   --   HGB 8.7* 9.7* 9.5* 8.5* 8.2*  HCT 26.2* 29.8* 28.4* 25.9* 25.0*  MCV 95.6  --  93.4 93.5 93.3  PLT 72*  --  79* 75* 117*  Basic Metabolic Panel: Recent Labs  Lab 12/10/18 0619 12/11/18 0419 12/12/18 0413 12/13/18 0415  NA 134* 138 137 141  K 3.5 3.6 3.4* 3.6  CL 103 110 107 105  CO2 20* 17* 23 26  GLUCOSE 117* 148* 129* 112*  BUN 43* 32* 20 16  CREATININE 4.54* 2.41* 1.53* 1.27*  CALCIUM 7.4* 7.3* 7.9* 8.3*  MG  --  1.6* 1.6* 1.4*   GFR: Estimated Creatinine Clearance: 51.2 mL/min (A) (by C-G formula based on SCr of 1.27 mg/dL (H)). Liver Function Tests: Recent Labs  Lab 12/10/18 0619 12/11/18 0419  AST 12* 15  ALT 13 12  ALKPHOS 28* 28*  BILITOT 0.6 1.1  PROT 5.5* 5.4*  ALBUMIN 2.9* 2.8*   Recent Labs  Lab 12/10/18 0619  LIPASE 14   No results for input(s): AMMONIA in the last  168 hours. Coagulation Profile: No results for input(s): INR, PROTIME in the last 168 hours. Cardiac Enzymes: No results for input(s): CKTOTAL, CKMB, CKMBINDEX, TROPONINI in the last 168 hours. BNP (last 3 results) Recent Labs    08/25/18 0844  PROBNP 70.0   HbA1C: No results for input(s): HGBA1C in the last 72 hours. CBG: No results for input(s): GLUCAP in the last 168 hours. Lipid Profile: No results for input(s): CHOL, HDL, LDLCALC, TRIG, CHOLHDL, LDLDIRECT in the last 72 hours. Thyroid Function Tests: No results for input(s): TSH, T4TOTAL, FREET4, T3FREE, THYROIDAB in the last 72 hours. Anemia Panel: Recent Labs    12/11/18 0419 12/11/18 1031  FOLATE  --  5.6*  RETICCTPCT 1.3  --    Sepsis Labs: Recent Labs  Lab 12/10/18 1610 12/10/18 0752 12/10/18 1233 12/11/18 0419  LATICACIDVEN 0.9 2.2* 1.2 0.9    Recent Results (from the past 240 hour(s))  Urine culture     Status: Abnormal   Collection Time: 12/10/18  8:32 AM   Specimen: Urine, Clean Catch  Result Value Ref Range Status   Specimen Description   Final    URINE, CLEAN CATCH Performed at [facility], [address], [place], [place] [address]    Special Requests   Final    NONE Performed at [facility], [address], [place], [place] [address]    Culture >=100,000 COLONIES/mL ESCHERICHIA COLI (A)  Final   Report Status 12/12/2018 FINAL  Final   Organism ID, Bacteria ESCHERICHIA COLI (A)  Final      Susceptibility   Escherichia coli - MIC*    AMPICILLIN >=32 RESISTANT Resistant     CEFAZOLIN 16 SENSITIVE Sensitive     CEFTRIAXONE <=1 SENSITIVE Sensitive     CIPROFLOXACIN <=0.25 SENSITIVE Sensitive     GENTAMICIN <=1 SENSITIVE Sensitive     IMIPENEM <=0.25 SENSITIVE Sensitive     NITROFURANTOIN <=16 SENSITIVE Sensitive     TRIMETH/SULFA <=20 SENSITIVE Sensitive     AMPICILLIN/SULBACTAM >=32 RESISTANT Resistant     PIP/TAZO <=4 SENSITIVE Sensitive     Extended ESBL NEGATIVE Sensitive     *  >=100,000 COLONIES/mL ESCHERICHIA COLI  Culture, blood (routine x 2)     Status: None (Preliminary result)   Collection Time: 12/10/18  9:50 AM   Specimen: BLOOD LEFT HAND  Result Value Ref Range Status   Specimen Description BLOOD LEFT HAND  Final   Special Requests   Final    BOTTLES DRAWN AEROBIC AND ANAEROBIC Blood Culture adequate volume   Culture   Final    NO GROWTH 3 DAYS Performed at [facility], [place]  [address] [place], [place] [address]    Report Status PENDING  Incomplete  Culture, blood (routine x 2)     Status: Abnormal   Collection Time: 12/10/18  9:51 AM   Specimen: BLOOD RIGHT HAND  Result Value Ref Range Status   Specimen Description   Final    BLOOD RIGHT HAND Performed at [facility], [address] [place], [place] [address]    Special Requests   Final    BOTTLES DRAWN AEROBIC AND ANAEROBIC Blood Culture results may not be optimal due to an inadequate volume of blood received in culture bottles Performed at [facility], [address] [place], [place] [address]    Culture  Setup Time   Final    IN BOTH AEROBIC AND ANAEROBIC BOTTLES GRAM NEGATIVE RODS Gram Stain Report Called to,Read Back By and Verified With: J HEARN,RN @2248  12/10/18 MKELLY CRITICAL RESULT CALLED TO, READ BACK BY AND VERIFIED WITH: Quentin Angst RN 12/11/18 9937 JDW Performed at [facility], [place] [address], [place], [place] [address]    Culture ESCHERICHIA COLI (A)  Final   Report Status 12/13/2018 FINAL  Final   Organism ID, Bacteria ESCHERICHIA COLI  Final      Susceptibility   Escherichia coli - MIC*    AMPICILLIN >=32 RESISTANT Resistant     CEFAZOLIN 16 SENSITIVE Sensitive     CEFEPIME <=1 SENSITIVE Sensitive     CEFTAZIDIME <=1 SENSITIVE Sensitive     CEFTRIAXONE <=1 SENSITIVE Sensitive     CIPROFLOXACIN <=0.25 SENSITIVE Sensitive     GENTAMICIN <=1 SENSITIVE Sensitive     IMIPENEM <=0.25 SENSITIVE Sensitive     TRIMETH/SULFA <=20 SENSITIVE Sensitive      AMPICILLIN/SULBACTAM >=32 RESISTANT Resistant     PIP/TAZO <=4 SENSITIVE Sensitive     Extended ESBL NEGATIVE Sensitive     * ESCHERICHIA COLI  Blood Culture ID Panel (Reflexed)     Status: Abnormal   Collection Time: 12/10/18  9:51 AM  Result Value Ref Range Status   Enterococcus species NOT DETECTED NOT DETECTED Final   Listeria monocytogenes NOT DETECTED NOT DETECTED Final   Staphylococcus species NOT DETECTED NOT DETECTED Final   Staphylococcus aureus (BCID) NOT DETECTED NOT DETECTED Final   Streptococcus species NOT DETECTED NOT DETECTED Final   Streptococcus agalactiae NOT DETECTED NOT DETECTED Final   Streptococcus pneumoniae NOT DETECTED NOT DETECTED Final   Streptococcus pyogenes NOT DETECTED NOT DETECTED Final   Acinetobacter baumannii NOT DETECTED NOT DETECTED Final   Enterobacteriaceae species DETECTED (A) NOT DETECTED Final    Comment: Enterobacteriaceae represent a large family of gram-negative bacteria, not a single organism. CRITICAL RESULT CALLED TO, READ BACK BY AND VERIFIED WITH: H TETREAULT RN 12/11/18 0412 JDW    Enterobacter cloacae complex NOT DETECTED NOT DETECTED Final   Escherichia coli DETECTED (A) NOT DETECTED Final    Comment: CRITICAL RESULT CALLED TO, READ BACK BY AND VERIFIED WITH: H TETREAULT RN 12/11/18 0412 JDW    Klebsiella oxytoca NOT DETECTED NOT DETECTED Final   Klebsiella pneumoniae NOT DETECTED NOT DETECTED Final   Proteus species NOT DETECTED NOT DETECTED Final   Serratia marcescens NOT DETECTED NOT DETECTED Final   Carbapenem resistance NOT DETECTED NOT DETECTED Final   Haemophilus influenzae NOT DETECTED NOT DETECTED Final   Neisseria meningitidis NOT DETECTED NOT DETECTED Final   Pseudomonas aeruginosa NOT DETECTED NOT DETECTED Final   Candida albicans NOT DETECTED NOT DETECTED Final   Candida glabrata NOT DETECTED NOT DETECTED Final  Candida krusei NOT DETECTED NOT DETECTED Final   Candida parapsilosis NOT DETECTED NOT DETECTED Final    Candida tropicalis NOT DETECTED NOT DETECTED Final    Comment: Performed at [facility], [place] [address], [place], [place] [address]  SARS Coronavirus 2 by RT PCR (hospital order, performed in [facility] hospital lab) Nasopharyngeal Nasopharyngeal Swab     Status: None   Collection Time: 12/10/18 10:03 AM   Specimen: Nasopharyngeal Swab  Result Value Ref Range Status   SARS Coronavirus 2 NEGATIVE NEGATIVE Final    Comment: (NOTE) If result is NEGATIVE SARS-CoV-2 target nucleic acids are NOT DETECTED. The SARS-CoV-2 RNA is generally detectable in upper and lower  respiratory specimens during the acute phase of infection. The lowest  concentration of SARS-CoV-2 viral copies this assay can detect is 250  copies / mL. A negative result does not preclude SARS-CoV-2 infection  and should not be used as the sole basis for treatment or other  patient management decisions.  A negative result may occur with  improper specimen collection / handling, submission of specimen other  than nasopharyngeal swab, presence of viral mutation(s) within the  areas targeted by this assay, and inadequate number of viral copies  (<250 copies / mL). A negative result must be combined with clinical  observations, patient history, and epidemiological information. If result is POSITIVE SARS-CoV-2 target nucleic acids are DETECTED. The SARS-CoV-2 RNA is generally detectable in upper and lower  respiratory specimens dur ing the acute phase of infection.  Positive  results are indicative of active infection with SARS-CoV-2.  Clinical  correlation with patient history and other diagnostic information is  necessary to determine patient infection status.  Positive results do  not rule out bacterial infection or co-infection with other viruses. If result is PRESUMPTIVE POSTIVE SARS-CoV-2 nucleic acids MAY BE PRESENT.   A presumptive positive result was obtained on the submitted specimen  and confirmed on repeat  testing.  While 2019 novel coronavirus  (SARS-CoV-2) nucleic acids may be present in the submitted sample  additional confirmatory testing may be necessary for epidemiological  and / or clinical management purposes  to differentiate between  SARS-CoV-2 and other Sarbecovirus currently known to infect humans.  If clinically indicated additional testing with an alternate test  methodology [phone]) is advised. The SARS-CoV-2 RNA is generally  detectable in upper and lower respiratory sp ecimens during the acute  phase of infection. The expected result is Negative. Fact Sheet for Patients:  StrictlyIdeas.no Fact Sheet for Healthcare Providers: BankingDealers.co.za This test is not yet approved or cleared by the [place] FDA and has been authorized for detection and/or diagnosis of SARS-CoV-2 by FDA under an Emergency Use Authorization (EUA).  This EUA will remain in effect (meaning this test can be used) for the duration of the COVID-19 declaration under Section 564(b)(1) of the Act, 21 U.S.C. section 360bbb-3(b)(1), unless the authorization is terminated or revoked sooner. Performed at [facility], [address], [place], [place] [address]   Surgical pcr screen     Status: Abnormal   Collection Time: 12/10/18 11:22 AM   Specimen: Nasal Mucosa; Nasal Swab  Result Value Ref Range Status   MRSA, PCR NEGATIVE NEGATIVE Final   Staphylococcus aureus POSITIVE (A) NEGATIVE Final    Comment: RESULT CALLED TO, READ BACK BY AND VERIFIED WITH: SMITH,J ON 12/10/18 AT 1750 BY LOY,C (NOTE) The Xpert SA Assay (FDA approved for NASAL specimens in patients 59 years of age and older), is  one component of a comprehensive surveillance program. It is not intended to diagnose infection nor to guide or monitor treatment. Performed at [facility], [address], [place], [place] [address]          Radiology Studies: No results found.       Scheduled Meds: . ALPRAZolam  0.5 mg Oral BID  . chlorhexidine  15 mL Mouth Rinse BID  . Chlorhexidine Gluconate Cloth  6 each Topical Daily  . fluticasone  1 spray Each Nare Daily  . folic acid  1 mg Oral Daily  . lamoTRIgine  200 mg Oral QHS  . mouth rinse  15 mL Mouth Rinse q12n4p  . mupirocin ointment  1 application Nasal BID  . pantoprazole (PROTONIX) IV  40 mg Intravenous Q12H  . potassium chloride  40 mEq Oral Once  . sertraline  200 mg Oral QHS  . sodium chloride flush  10-40 mL Intracatheter Q12H  . varenicline  1 mg Oral BID   Continuous Infusions: . sodium chloride 250 mL (12/10/18 1203)  . sodium chloride    . lactated ringers 100 mL/hr at 12/13/18 1032  . magnesium sulfate bolus IVPB    . norepinephrine (LEVOPHED) Adult infusion 2 mcg/min (12/13/18 0755)  . piperacillin-tazobactam (ZOSYN)  IV 3.375 g (12/13/18 1024)     LOS: 3 days    Time spent: 30 minutes    Avry Roedl Darleen Crocker, DO [facility] Pager [phone]  If 7PM-7AM, please contact night-coverage [email or web address] Password [email or web address] 12/13/2018, 11:00 AM

## 2018-12-13 NOTE — Progress Notes (Signed)
Subjective: She says she feels better.  She has been able to get up and go to the bathroom.  She is still on Levophed but in a much lower dose.  She has no new complaints.  She says she wants to go home.  Objective: Vital signs in last 24 hours: Temp:  [98.1 F (36.7 C)-99.5 F (37.5 C)] 98.8 F (37.1 C) (10/24 0742) Pulse Rate:  [77-125] 83 (10/24 0700) Resp:  [10-44] 33 (10/24 0700) BP: (83-144)/(41-76) 109/63 (10/24 0700) SpO2:  [80 %-100 %] 94 % (10/24 0700) Weight:  [76 kg] 76 kg (10/24 0510) Weight change: 3.5 kg Last BM Date: 12/10/18  Intake/Output from previous day: 10/23 0701 - 10/24 0700 In: 2766.8 [P.O.:240; I.V.:2458.1; IV Piggyback:68.8] Out: 0   PHYSICAL EXAM General appearance: alert, cooperative and no distress Resp: rhonchi bilaterally Cardio: regular rate and rhythm, S1, S2 normal, no murmur, click, rub or gallop GI: soft, non-tender; bowel sounds normal; no masses,  no organomegaly Extremities: extremities normal, atraumatic, no cyanosis or edema  Lab Results:  Results for orders placed or performed during the hospital encounter of 12/10/18 (from the past 48 hour(s))  Folate     Status: Abnormal   Collection Time: 12/11/18 10:31 AM  Result Value Ref Range   Folate 5.6 (L) >5.9 ng/mL    Comment: Performed at [facility], [address], [place], [place] [address]  CBC     Status: Abnormal   Collection Time: 12/12/18  4:13 AM  Result Value Ref Range   WBC 4.3 4.0 - 10.5 K/uL   RBC 2.77 (L) 3.87 - 5.11 MIL/uL   Hemoglobin 8.5 (L) 12.0 - 15.0 g/dL   HCT 25.9 (L) 36.0 - 46.0 %   MCV 93.5 80.0 - 100.0 fL   MCH 30.7 26.0 - 34.0 pg   MCHC 32.8 30.0 - 36.0 g/dL   RDW 13.8 11.5 - 15.5 %   Platelets 75 (L) 150 - 400 K/uL    Comment: Immature Platelet Fraction may be clinically indicated, consider ordering this additional test [id number] CONSISTENT WITH PREVIOUS RESULT    nRBC 0.0 0.0 - 0.2 %    Comment: Performed at [facility], [address],  [place], [place] [address]  Basic metabolic panel     Status: Abnormal   Collection Time: 12/12/18  4:13 AM  Result Value Ref Range   Sodium 137 135 - 145 mmol/L   Potassium 3.4 (L) 3.5 - 5.1 mmol/L   Chloride 107 98 - 111 mmol/L   CO2 23 22 - 32 mmol/L   Glucose, Bld 129 (H) 70 - 99 mg/dL   BUN 20 6 - 20 mg/dL   Creatinine, Ser 1.53 (H) 0.44 - 1.00 mg/dL   Calcium 7.9 (L) 8.9 - 10.3 mg/dL   GFR calc non Af Amer 40 (L) >60 mL/min   GFR calc Af Amer 46 (L) >60 mL/min   Anion gap 7 5 - 15    Comment: Performed at [facility], [address], [place], [place] [address]  Magnesium     Status: Abnormal   Collection Time: 12/12/18  4:13 AM  Result Value Ref Range   Magnesium 1.6 (L) 1.7 - 2.4 mg/dL    Comment: Performed at [facility], [address] [place], [place] [address]  Basic metabolic panel     Status: Abnormal   Collection Time: 12/13/18  4:15 AM  Result Value Ref Range   Sodium 141 135 - 145 mmol/L   Potassium 3.6 3.5 -  5.1 mmol/L   Chloride 105 98 - 111 mmol/L   CO2 26 22 - 32 mmol/L   Glucose, Bld 112 (H) 70 - 99 mg/dL   BUN 16 6 - 20 mg/dL   Creatinine, Ser 1.27 (H) 0.44 - 1.00 mg/dL   Calcium 8.3 (L) 8.9 - 10.3 mg/dL   GFR calc non Af Amer 50 (L) >60 mL/min   GFR calc Af Amer 57 (L) >60 mL/min   Anion gap 10 5 - 15    Comment: Performed at [facility], [address] [place], [place] [address]  CBC     Status: Abnormal   Collection Time: 12/13/18  4:15 AM  Result Value Ref Range   WBC 4.8 4.0 - 10.5 K/uL   RBC 2.68 (L) 3.87 - 5.11 MIL/uL   Hemoglobin 8.2 (L) 12.0 - 15.0 g/dL   HCT 25.0 (L) 36.0 - 46.0 %   MCV 93.3 80.0 - 100.0 fL   MCH 30.6 26.0 - 34.0 pg   MCHC 32.8 30.0 - 36.0 g/dL   RDW 13.5 11.5 - 15.5 %   Platelets 117 (L) 150 - 400 K/uL    Comment: PLATELET COUNT CONFIRMED BY SMEAR SPECIMEN CHECKED FOR CLOTS    nRBC 0.0 0.0 - 0.2 %    Comment: Performed at [facility], [address], [place], [place] [address]  Magnesium     Status: Abnormal    Collection Time: 12/13/18  4:15 AM  Result Value Ref Range   Magnesium 1.4 (L) 1.7 - 2.4 mg/dL    Comment: Performed at [facility], [address] [place], [place] [address]    ABGS No results for input(s): PHART, PO2ART, TCO2, HCO3 in the last 72 hours.  Invalid input(s): PCO2 CULTURES Recent Results (from the past 240 hour(s))  Urine culture     Status: Abnormal   Collection Time: 12/10/18  8:32 AM   Specimen: Urine, Clean Catch  Result Value Ref Range Status   Specimen Description   Final    URINE, CLEAN CATCH Performed at [facility], [address] [place], [place] [address]    Special Requests   Final    NONE Performed at [facility], [address], [place], [place] [address]    Culture >=100,000 COLONIES/mL ESCHERICHIA COLI (A)  Final   Report Status 12/12/2018 FINAL  Final   Organism ID, Bacteria ESCHERICHIA COLI (A)  Final      Susceptibility   Escherichia coli - MIC*    AMPICILLIN >=32 RESISTANT Resistant     CEFAZOLIN 16 SENSITIVE Sensitive     CEFTRIAXONE <=1 SENSITIVE Sensitive     CIPROFLOXACIN <=0.25 SENSITIVE Sensitive     GENTAMICIN <=1 SENSITIVE Sensitive     IMIPENEM <=0.25 SENSITIVE Sensitive     NITROFURANTOIN <=16 SENSITIVE Sensitive     TRIMETH/SULFA <=20 SENSITIVE Sensitive     AMPICILLIN/SULBACTAM >=32 RESISTANT Resistant     PIP/TAZO <=4 SENSITIVE Sensitive     Extended ESBL NEGATIVE Sensitive     * >=100,000 COLONIES/mL ESCHERICHIA COLI  Culture, blood (routine x 2)     Status: None (Preliminary result)   Collection Time: 12/10/18  9:50 AM   Specimen: BLOOD LEFT HAND  Result Value Ref Range Status   Specimen Description BLOOD LEFT HAND  Final   Special Requests   Final    BOTTLES DRAWN AEROBIC AND ANAEROBIC Blood Culture adequate volume   Culture   Final    NO GROWTH 3 DAYS Performed at [facility], [address], [place], [place] [address]    Report Status PENDING  Incomplete  Culture, blood (routine x 2)     Status: Abnormal  (Preliminary result)   Collection Time: 12/10/18  9:51 AM   Specimen: BLOOD RIGHT HAND  Result Value Ref Range Status   Specimen Description   Final    BLOOD RIGHT HAND Performed at [facility], [address] [place], [place] [address]    Special Requests   Final    BOTTLES DRAWN AEROBIC AND ANAEROBIC Blood Culture results may not be optimal due to an inadequate volume of blood received in culture bottles Performed at [facility], [address], [place], [place] [address]    Culture  Setup Time   Final    IN BOTH AEROBIC AND ANAEROBIC BOTTLES GRAM NEGATIVE RODS Gram Stain Report Called to,Read Back By and Verified With: J HEARN,RN @2248  12/10/18 MKELLY CRITICAL RESULT CALLED TO, READ BACK BY AND VERIFIED WITH: H TETREAULT RN 12/11/18 0412 JDW    Culture (A)  Final    ESCHERICHIA COLI SUSCEPTIBILITIES TO FOLLOW Performed at [facility], [place] [address] [place], [place] [address]    Report Status PENDING  Incomplete  Blood Culture ID Panel (Reflexed)     Status: Abnormal   Collection Time: 12/10/18  9:51 AM  Result Value Ref Range Status   Enterococcus species NOT DETECTED NOT DETECTED Final   Listeria monocytogenes NOT DETECTED NOT DETECTED Final   Staphylococcus species NOT DETECTED NOT DETECTED Final   Staphylococcus aureus (BCID) NOT DETECTED NOT DETECTED Final   Streptococcus species NOT DETECTED NOT DETECTED Final   Streptococcus agalactiae NOT DETECTED NOT DETECTED Final   Streptococcus pneumoniae NOT DETECTED NOT DETECTED Final   Streptococcus pyogenes NOT DETECTED NOT DETECTED Final   Acinetobacter baumannii NOT DETECTED NOT DETECTED Final   Enterobacteriaceae species DETECTED (A) NOT DETECTED Final    Comment: Enterobacteriaceae represent a large family of gram-negative bacteria, not a single organism. CRITICAL RESULT CALLED TO, READ BACK BY AND VERIFIED WITH: H TETREAULT RN 12/11/18 0412 JDW    Enterobacter cloacae complex NOT DETECTED NOT DETECTED Final    Escherichia coli DETECTED (A) NOT DETECTED Final    Comment: CRITICAL RESULT CALLED TO, READ BACK BY AND VERIFIED WITH: H TETREAULT RN 12/11/18 0412 JDW    Klebsiella oxytoca NOT DETECTED NOT DETECTED Final   Klebsiella pneumoniae NOT DETECTED NOT DETECTED Final   Proteus species NOT DETECTED NOT DETECTED Final   Serratia marcescens NOT DETECTED NOT DETECTED Final   Carbapenem resistance NOT DETECTED NOT DETECTED Final   Haemophilus influenzae NOT DETECTED NOT DETECTED Final   Neisseria meningitidis NOT DETECTED NOT DETECTED Final   Pseudomonas aeruginosa NOT DETECTED NOT DETECTED Final   Candida albicans NOT DETECTED NOT DETECTED Final   Candida glabrata NOT DETECTED NOT DETECTED Final   Candida krusei NOT DETECTED NOT DETECTED Final   Candida parapsilosis NOT DETECTED NOT DETECTED Final   Candida tropicalis NOT DETECTED NOT DETECTED Final    Comment: Performed at [facility], [place] [address], [place], [place] [address]  SARS Coronavirus 2 by RT PCR (hospital order, performed in [facility] hospital lab) Nasopharyngeal Nasopharyngeal Swab     Status: None   Collection Time: 12/10/18 10:03 AM   Specimen: Nasopharyngeal Swab  Result Value Ref Range Status   SARS Coronavirus 2 NEGATIVE NEGATIVE Final    Comment: (NOTE) If result is NEGATIVE SARS-CoV-2 target nucleic acids are NOT DETECTED. The  SARS-CoV-2 RNA is generally detectable in upper and lower  respiratory specimens during the acute phase of infection. The lowest  concentration of SARS-CoV-2 viral copies this assay can detect is 250  copies / mL. A negative result does not preclude SARS-CoV-2 infection  and should not be used as the sole basis for treatment or other  patient management decisions.  A negative result may occur with  improper specimen collection / handling, submission of specimen other  than nasopharyngeal swab, presence of viral mutation(s) within the  areas targeted by this assay, and inadequate number of  viral copies  (<250 copies / mL). A negative result must be combined with clinical  observations, patient history, and epidemiological information. If result is POSITIVE SARS-CoV-2 target nucleic acids are DETECTED. The SARS-CoV-2 RNA is generally detectable in upper and lower  respiratory specimens dur ing the acute phase of infection.  Positive  results are indicative of active infection with SARS-CoV-2.  Clinical  correlation with patient history and other diagnostic information is  necessary to determine patient infection status.  Positive results do  not rule out bacterial infection or co-infection with other viruses. If result is PRESUMPTIVE POSTIVE SARS-CoV-2 nucleic acids MAY BE PRESENT.   A presumptive positive result was obtained on the submitted specimen  and confirmed on repeat testing.  While 2019 novel coronavirus  (SARS-CoV-2) nucleic acids may be present in the submitted sample  additional confirmatory testing may be necessary for epidemiological  and / or clinical management purposes  to differentiate between  SARS-CoV-2 and other Sarbecovirus currently known to infect humans.  If clinically indicated additional testing with an alternate test  methodology [phone]) is advised. The SARS-CoV-2 RNA is generally  detectable in upper and lower respiratory sp ecimens during the acute  phase of infection. The expected result is Negative. Fact Sheet for Patients:  StrictlyIdeas.no Fact Sheet for Healthcare Providers: BankingDealers.co.za This test is not yet approved or cleared by the [place] FDA and has been authorized for detection and/or diagnosis of SARS-CoV-2 by FDA under an Emergency Use Authorization (EUA).  This EUA will remain in effect (meaning this test can be used) for the duration of the COVID-19 declaration under Section 564(b)(1) of the Act, 21 U.S.C. section 360bbb-3(b)(1), unless the authorization is  terminated or revoked sooner. Performed at [facility], [address] [place], [place] [address]   Surgical pcr screen     Status: Abnormal   Collection Time: 12/10/18 11:22 AM   Specimen: Nasal Mucosa; Nasal Swab  Result Value Ref Range Status   MRSA, PCR NEGATIVE NEGATIVE Final   Staphylococcus aureus POSITIVE (A) NEGATIVE Final    Comment: RESULT CALLED TO, READ BACK BY AND VERIFIED WITH: SMITH,J ON 12/10/18 AT 1750 BY LOY,C (NOTE) The Xpert SA Assay (FDA approved for NASAL specimens in patients 58 years of age and older), is one component of a comprehensive surveillance program. It is not intended to diagnose infection nor to guide or monitor treatment. Performed at [facility], [address] [place], [place] [address]    Studies/Results: [place] Ekg Site Rite  Result Date: 12/11/2018 If [facility] image not attached, placement could not be confirmed due to current cardiac rhythm.   Medications:  Prior to Admission:  Medications Prior to Admission  Medication Sig Dispense Refill Last Dose  . ALPRAZolam (XANAX) 1 MG tablet Take 1 mg by mouth 2 (two) times daily.   12/09/2018 at Unknown time  . benazepril-hydrochlorthiazide (LOTENSIN HCT) 20-12.5 MG tablet Take  1 tablet by mouth daily. 90 tablet 3 12/09/2018 at Unknown time  . Cholecalciferol (VITAMIN D3) 250 MCG (10000 UT) capsule Take 10,000 Units by mouth daily.   12/09/2018 at Unknown time  . cyanocobalamin (,VITAMIN B-12,) 1000 MCG/ML injection INJECT 1ML INTO THE SKIN EVERY 14 DAYS (Patient taking differently: Every 10 days) 6 mL 11 Past Month at Unknown time  . cyclobenzaprine (FLEXERIL) 5 MG tablet TAKE 1 TABLET (5 MG TOTAL) BY MOUTH AT BEDTIME AS NEEDED FOR MUSCLE SPASMS. 30 tablet 1 12/09/2018 at Unknown time  . diclofenac sodium (VOLTAREN) 1 % GEL Apply 1 application topically 4 (four) times daily. 100 g 3 12/09/2018 at Unknown time  . HYDROcodone-acetaminophen (NORCO) 10-325 MG tablet Take 1 tablet by mouth every 8  (eight) hours as needed for severe pain. 90 tablet 0 12/09/2018 at Unknown time  . ketorolac (TORADOL) 10 MG tablet Take 1 tablet (10 mg total) by mouth every 6 (six) hours as needed. for pain 20 tablet 0   . lamoTRIgine (LAMICTAL) 200 MG tablet Take 1 tablet by mouth daily.   12/09/2018 at Unknown time  . norethindrone (AYGESTIN) 5 MG tablet Take 1 tablet (5 mg total) by mouth daily. 90 tablet 3 12/09/2018 at Unknown time  . pantoprazole (PROTONIX) 40 MG tablet 1 PO 30 MINUTES PRIOR TO YOUR FIRST MEAL QD 90 tablet 3 12/09/2018 at Unknown time  . potassium chloride (K-DUR) 10 MEQ tablet TAKE 1 TABLET BY MOUTH EVERY DAY 90 tablet 3 12/09/2018 at Unknown time  . sertraline (ZOLOFT) 100 MG tablet Take 200 mg by mouth daily.    12/09/2018 at Unknown time  . sodium chloride 0.9 % SOLN 250 mL with vedolizumab 300 MG SOLR 300 mg Inject 300 mg into the vein. LOADING DOSE THEN Q8 WEEKS     . SYRINGE-NEEDLE, DISP, 3 ML (B-D SYRINGE/NEEDLE 3CC/25GX5/8) 25G X 5/8" 3 ML MISC Use to administer B12 injection every 14 days 24 each 0   . varenicline (CHANTIX CONTINUING MONTH PAK) 1 MG tablet Take 1 tablet (1 mg total) by mouth 2 (two) times daily. 60 tablet 4 12/09/2018 at Unknown time  . vedolizumab (ENTYVIO) 300 MG injection Inject into the vein. Every 8 weeks     . ciprofloxacin (CIPRO) 500 MG tablet Take 1 tablet (500 mg total) by mouth 2 (two) times daily. (Patient not taking: Reported on 12/10/2018) 28 tablet 1 Completed Course at Unknown time  . fluconazole (DIFLUCAN) 150 MG tablet Take 1 tablet (150 mg total) by mouth every three (3) days as needed. (Patient not taking: Reported on 12/10/2018) 2 tablet 1 Not Taking at Unknown time  . ondansetron (ZOFRAN) 4 MG tablet Take 1 tablet (4 mg total) by mouth every 8 (eight) hours as needed for nausea or vomiting. (Patient not taking: Reported on 12/10/2018) 20 tablet 0 Not Taking at Unknown time  . promethazine (PHENERGAN) 25 MG tablet Take 1 tablet (25 mg total) by  mouth 2 (two) times daily as needed for nausea. (Patient not taking: Reported on 12/10/2018) 60 tablet 0 Not Taking at Unknown time  . varenicline (CHANTIX STARTING MONTH PAK) 0.5 MG X 11 & 1 MG X 42 tablet Take one 0.5 mg tablet by mouth once daily for 3 days, then increase to one 0.5 mg tablet twice daily for 4 days, then increase to one 1 mg tablet twice daily. (Patient not taking: Reported on 12/10/2018) 53 tablet 0 Completed Course at Unknown time   Scheduled: . ALPRAZolam  0.5 mg  Oral BID  . chlorhexidine  15 mL Mouth Rinse BID  . Chlorhexidine Gluconate Cloth  6 each Topical Daily  . fluticasone  1 spray Each Nare Daily  . folic acid  1 mg Oral Daily  . lamoTRIgine  200 mg Oral QHS  . mouth rinse  15 mL Mouth Rinse q12n4p  . mupirocin ointment  1 application Nasal BID  . pantoprazole (PROTONIX) IV  40 mg Intravenous Q12H  . sertraline  200 mg Oral QHS  . sodium chloride flush  10-40 mL Intracatheter Q12H  . varenicline  1 mg Oral BID   Continuous: . sodium chloride 250 mL (12/10/18 1203)  . sodium chloride    . lactated ringers 100 mL/hr at 12/13/18 0011  . norepinephrine (LEVOPHED) Adult infusion 2 mcg/min (12/13/18 0755)  . piperacillin-tazobactam (ZOSYN)  IV 3.375 g (12/13/18 0226)   LAG:TXMIW/[id number] arterial line **AND** sodium chloride, acetaminophen **OR** acetaminophen, HYDROmorphone (DILAUDID) injection, ondansetron **OR** ondansetron (ZOFRAN) IV, sodium chloride flush  Assesment: She was admitted with septic shock.  This appears to be related to E. coli UTI and bacteremia.  She has required Levophed but she is now only on 4 mics and she has been getting up and moving so that can probably be discontinued today.  She had pneumonia presumably from aspiration and she is getting better with that.  She has continued hypoxia but she is down to 2 L nasal oxygen.  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  She is  immunocompromised from her Crohn's disease  She has bipolar disease on medication  She had acute kidney injury likely related to dehydration and her renal function is improving  She has had thrombocytopenia in her platelet count has come up substantially Active Problems:   Sepsis ([place])    Plan: Continue treatments.  Current antibiotic should be good for her pneumonia presumably from aspiration and her E. coli is sensitive.    LOS: 3 days   [name] [name] 12/13/2018, 7:59 AM

## 2018-12-14 LAB — BASIC METABOLIC PANEL
Anion gap: 10 (ref 5–15)
BUN: 16 mg/dL (ref 6–20)
CO2: 27 mmol/L (ref 22–32)
Calcium: 8.4 mg/dL — ABNORMAL LOW (ref 8.9–10.3)
Chloride: 104 mmol/L (ref 98–111)
Creatinine, Ser: 1.2 mg/dL — ABNORMAL HIGH (ref 0.44–1.00)
GFR calc Af Amer: >60 mL/min (ref >60)
GFR calc non Af Amer: 53 mL/min — ABNORMAL LOW (ref >60)
Glucose, Bld: 90 mg/dL (ref 70–99)
Potassium: 3.5 mmol/L (ref 3.5–5.1)
Sodium: 141 mmol/L (ref 135–145)

## 2018-12-14 LAB — CBC
HCT: 22.5 % — ABNORMAL LOW (ref 36.0–46.0)
Hemoglobin: 7.2 g/dL — ABNORMAL LOW (ref 12.0–15.0)
MCH: 30.5 pg (ref 26.0–34.0)
MCHC: 32 g/dL (ref 30.0–36.0)
MCV: 95.3 fL (ref 80.0–100.0)
Platelets: 132 10*3/uL — ABNORMAL LOW (ref 150–400)
RBC: 2.36 MIL/uL — ABNORMAL LOW (ref 3.87–5.11)
RDW: 13.3 % (ref 11.5–15.5)
WBC: 4.3 10*3/uL (ref 4.0–10.5)
nRBC: 0 % (ref 0.0–0.2)

## 2018-12-14 LAB — HEMOGLOBIN AND HEMATOCRIT, BLOOD
HCT: 23.8 % — ABNORMAL LOW (ref 36.0–46.0)
Hemoglobin: 7.6 g/dL — ABNORMAL LOW (ref 12.0–15.0)

## 2018-12-14 LAB — MAGNESIUM: Magnesium: 1.6 mg/dL — ABNORMAL LOW (ref 1.7–2.4)

## 2018-12-14 MED ORDER — BENZONATATE 100 MG PO CAPS
100.0000 mg | ORAL_CAPSULE | Freq: Three times a day (TID) | ORAL | Status: DC | PRN
  Administered 2018-12-14 (×3): 100 mg via ORAL
  Filled 2018-12-14 (×3): qty 1

## 2018-12-14 MED ORDER — MAGNESIUM SULFATE 2 GM/50ML IV SOLN
2.0000 g | Freq: Once | INTRAVENOUS | Status: AC
  Administered 2018-12-14: 2 g via INTRAVENOUS
  Filled 2018-12-14: qty 50

## 2018-12-14 NOTE — Progress Notes (Signed)
Pharmacy Antibiotic Note  [name] [name] is a 48 y.o. female admitted on 12/10/2018 with sepsis.  Pharmacy has been consulted for  Zosyn  dosing. Renal function is improving.   multifocal pneumonia with likely aspiration along with E. coli bacteremia likely secondary to UTI  Plan: Continue Zosyn 3.375g IV q8h EID over 4 hours F/U cxs and clinical progress Monitor V/S, labs    Height: 5\' 2"  (157.5 cm) Weight: 166 lb 10.7 oz (75.6 kg) IBW/kg (Calculated) : 50.1  Temp (24hrs), Avg:98.5 F (36.9 C), Min:97.7 F (36.5 C), Max:99 F (37.2 C)  Recent Labs  Lab 12/10/18 0619 12/10/18 0752 12/10/18 1233 12/11/18 0419 12/12/18 0413 12/13/18 0415 12/14/18 0507  WBC 4.0  --   --  4.3 4.3 4.8 4.3  CREATININE 4.54*  --   --  2.41* 1.53* 1.27* 1.20*  LATICACIDVEN 0.9 2.2* 1.2 0.9  --   --   --     Estimated Creatinine Clearance: 54 mL/min (A) (by C-G formula based on SCr of 1.2 mg/dL (H)).    Allergies  Allergen Reactions  . Cephalexin     itching  . Codeine   . Guaifenesin   . Imitrex [Sumatriptan]     2 fingers got very hot  . Morphine   . 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 Sulfonamide Derivatives   . Ultram [Tramadol Hcl]     [employer] syndrome with either remicade or ultram    Antimicrobials this admission: levofloxacin 10/21 >> x1 dose Zosyn 10/21 >>   vancomycin 10/21>>10/22   Microbiology results: 10-21 BC x2:  BCID. E.Coli 10-21 UCx:  E coli  Sens cefazolin/rocephin R: unasyn 10-21 MRSA PCR: negative  Thank you for allowing pharmacy to be a part of this patient's care.  Margot Ables, PharmD Clinical Pharmacist 12/14/2018 2:51 PM

## 2018-12-14 NOTE — Progress Notes (Signed)
Subjective: She says she feels better.  She was admitted with septic shock and she was able to come off the pressors yesterday.  She has aspiration pneumonia and has been having significant cough but did much better when she got started on Tessalon and says she was able to sleep last night.  Objective: Vital signs in last 24 hours: Temp:  [97.7 F (36.5 C)-99 F (37.2 C)] 97.7 F (36.5 C) (10/25 0500) Pulse Rate:  [81-105] 81 (10/25 0700) Resp:  [7-40] 26 (10/25 0700) BP: (70-110)/(43-70) 97/50 (10/25 0700) SpO2:  [89 %-98 %] 97 % (10/25 0700) Weight:  [75.6 kg] 75.6 kg (10/25 0500) Weight change: -0.4 kg Last BM Date: 12/13/18  Intake/Output from previous day: 10/24 0701 - 10/25 0700 In: 4336.9 [P.O.:1440; I.V.:2697.5; IV Piggyback:199.4] Out: 250 [Urine:250]  PHYSICAL EXAM General appearance: alert, cooperative and no distress Resp: rhonchi bilaterally Cardio: regular rate and rhythm, S1, S2 normal, no murmur, click, rub or gallop GI: soft, non-tender; bowel sounds normal; no masses,  no organomegaly Extremities: extremities normal, atraumatic, no cyanosis or edema  Lab Results:  Results for orders placed or performed during the hospital encounter of 12/10/18 (from the past 48 hour(s))  Basic metabolic panel     Status: Abnormal   Collection Time: 12/13/18  4:15 AM  Result Value Ref Range   Sodium 141 135 - 145 mmol/L   Potassium 3.6 3.5 - 5.1 mmol/L   Chloride 105 98 - 111 mmol/L   CO2 26 22 - 32 mmol/L   Glucose, Bld 112 (H) 70 - 99 mg/dL   BUN 16 6 - 20 mg/dL   Creatinine, Ser 1.27 (H) 0.44 - 1.00 mg/dL   Calcium 8.3 (L) 8.9 - 10.3 mg/dL   GFR calc non Af Amer 50 (L) >60 mL/min   GFR calc Af Amer 57 (L) >60 mL/min   Anion gap 10 5 - 15    Comment: Performed at [facility], [address] [place], [place] [address]  CBC     Status: Abnormal   Collection Time: 12/13/18  4:15 AM  Result Value Ref Range   WBC 4.8 4.0 - 10.5 K/uL   RBC 2.68 (L) 3.87 - 5.11 MIL/uL    Hemoglobin 8.2 (L) 12.0 - 15.0 g/dL   HCT 25.0 (L) 36.0 - 46.0 %   MCV 93.3 80.0 - 100.0 fL   MCH 30.6 26.0 - 34.0 pg   MCHC 32.8 30.0 - 36.0 g/dL   RDW 13.5 11.5 - 15.5 %   Platelets 117 (L) 150 - 400 K/uL    Comment: PLATELET COUNT CONFIRMED BY SMEAR SPECIMEN CHECKED FOR CLOTS    nRBC 0.0 0.0 - 0.2 %    Comment: Performed at [facility], [address] [place], [place] [address]  Magnesium     Status: Abnormal   Collection Time: 12/13/18  4:15 AM  Result Value Ref Range   Magnesium 1.4 (L) 1.7 - 2.4 mg/dL    Comment: Performed at [facility], [address], [place], [place] [address]  CBC     Status: Abnormal   Collection Time: 12/14/18  5:07 AM  Result Value Ref Range   WBC 4.3 4.0 - 10.5 K/uL   RBC 2.36 (L) 3.87 - 5.11 MIL/uL   Hemoglobin 7.2 (L) 12.0 - 15.0 g/dL   HCT 22.5 (L) 36.0 - 46.0 %   MCV 95.3 80.0 - 100.0 fL   MCH 30.5 26.0 - 34.0 pg   MCHC 32.0 30.0 - 36.0  g/dL   RDW 13.3 11.5 - 15.5 %   Platelets 132 (L) 150 - 400 K/uL   nRBC 0.0 0.0 - 0.2 %    Comment: Performed at [facility], [address] [place], [place] [address]  Basic metabolic panel     Status: Abnormal   Collection Time: 12/14/18  5:07 AM  Result Value Ref Range   Sodium 141 135 - 145 mmol/L   Potassium 3.5 3.5 - 5.1 mmol/L   Chloride 104 98 - 111 mmol/L   CO2 27 22 - 32 mmol/L   Glucose, Bld 90 70 - 99 mg/dL   BUN 16 6 - 20 mg/dL   Creatinine, Ser 1.20 (H) 0.44 - 1.00 mg/dL   Calcium 8.4 (L) 8.9 - 10.3 mg/dL   GFR calc non Af Amer 53 (L) >60 mL/min   GFR calc Af Amer >60 >60 mL/min   Anion gap 10 5 - 15    Comment: Performed at [facility], [address], [place], [place] [address]  Magnesium     Status: Abnormal   Collection Time: 12/14/18  5:07 AM  Result Value Ref Range   Magnesium 1.6 (L) 1.7 - 2.4 mg/dL    Comment: Performed at [facility], [address], [place], [place] [address]    ABGS No results for input(s): PHART, PO2ART, TCO2, HCO3 in the last 72 hours.  Invalid  input(s): PCO2 CULTURES Recent Results (from the past 240 hour(s))  Urine culture     Status: Abnormal   Collection Time: 12/10/18  8:32 AM   Specimen: Urine, Clean Catch  Result Value Ref Range Status   Specimen Description   Final    URINE, CLEAN CATCH Performed at [facility] [address] [place], [place] [address]    Special Requests   Final    NONE Performed at [facility], [address], [place], [place] [address]    Culture >=100,000 COLONIES/mL ESCHERICHIA COLI (A)  Final   Report Status 12/12/2018 FINAL  Final   Organism ID, Bacteria ESCHERICHIA COLI (A)  Final      Susceptibility   Escherichia coli - MIC*    AMPICILLIN >=32 RESISTANT Resistant     CEFAZOLIN 16 SENSITIVE Sensitive     CEFTRIAXONE <=1 SENSITIVE Sensitive     CIPROFLOXACIN <=0.25 SENSITIVE Sensitive     GENTAMICIN <=1 SENSITIVE Sensitive     IMIPENEM <=0.25 SENSITIVE Sensitive     NITROFURANTOIN <=16 SENSITIVE Sensitive     TRIMETH/SULFA <=20 SENSITIVE Sensitive     AMPICILLIN/SULBACTAM >=32 RESISTANT Resistant     PIP/TAZO <=4 SENSITIVE Sensitive     Extended ESBL NEGATIVE Sensitive     * >=100,000 COLONIES/mL ESCHERICHIA COLI  Culture, blood (routine x 2)     Status: None (Preliminary result)   Collection Time: 12/10/18  9:50 AM   Specimen: BLOOD LEFT HAND  Result Value Ref Range Status   Specimen Description BLOOD LEFT HAND  Final   Special Requests   Final    BOTTLES DRAWN AEROBIC AND ANAEROBIC Blood Culture adequate volume   Culture   Final    NO GROWTH 3 DAYS Performed at [facility] [address] [place],  [address]    Report Status PENDING  Incomplete  Culture, blood (routine x 2)     Status: Abnormal   Collection Time: 12/10/18  9:51 AM   Specimen: BLOOD RIGHT HAND  Result Value Ref Range Status   Specimen Description   Final    BLOOD  RIGHT HAND Performed at [facility], [address], [place], [place] [address]    Special Requests   Final    BOTTLES DRAWN AEROBIC  AND ANAEROBIC Blood Culture results may not be optimal due to an inadequate volume of blood received in culture bottles Performed at [facility], [address], [place], [place] [address]    Culture  Setup Time   Final    IN BOTH AEROBIC AND ANAEROBIC BOTTLES GRAM NEGATIVE RODS Gram Stain Report Called to,Read Back By and Verified With: J HEARN,RN @2248  12/10/18 MKELLY CRITICAL RESULT CALLED TO, READ BACK BY AND VERIFIED WITH: Quentin Angst RN 12/11/18 5885 JDW Performed at [facility], [place] [address] [place], [place] [address]    Culture ESCHERICHIA COLI (A)  Final   Report Status 12/13/2018 FINAL  Final   Organism ID, Bacteria ESCHERICHIA COLI  Final      Susceptibility   Escherichia coli - MIC*    AMPICILLIN >=32 RESISTANT Resistant     CEFAZOLIN 16 SENSITIVE Sensitive     CEFEPIME <=1 SENSITIVE Sensitive     CEFTAZIDIME <=1 SENSITIVE Sensitive     CEFTRIAXONE <=1 SENSITIVE Sensitive     CIPROFLOXACIN <=0.25 SENSITIVE Sensitive     GENTAMICIN <=1 SENSITIVE Sensitive     IMIPENEM <=0.25 SENSITIVE Sensitive     TRIMETH/SULFA <=20 SENSITIVE Sensitive     AMPICILLIN/SULBACTAM >=32 RESISTANT Resistant     PIP/TAZO <=4 SENSITIVE Sensitive     Extended ESBL NEGATIVE Sensitive     * ESCHERICHIA COLI  Blood Culture ID Panel (Reflexed)     Status: Abnormal   Collection Time: 12/10/18  9:51 AM  Result Value Ref Range Status   Enterococcus species NOT DETECTED NOT DETECTED Final   Listeria monocytogenes NOT DETECTED NOT DETECTED Final   Staphylococcus species NOT DETECTED NOT DETECTED Final   Staphylococcus aureus (BCID) NOT DETECTED NOT DETECTED Final   Streptococcus species NOT DETECTED NOT DETECTED Final   Streptococcus agalactiae NOT DETECTED NOT DETECTED Final   Streptococcus pneumoniae NOT DETECTED NOT DETECTED Final   Streptococcus pyogenes NOT DETECTED NOT DETECTED Final   Acinetobacter baumannii NOT DETECTED NOT DETECTED Final   Enterobacteriaceae species DETECTED (A)  NOT DETECTED Final    Comment: Enterobacteriaceae represent a large family of gram-negative bacteria, not a single organism. CRITICAL RESULT CALLED TO, READ BACK BY AND VERIFIED WITH: H TETREAULT RN 12/11/18 0412 JDW    Enterobacter cloacae complex NOT DETECTED NOT DETECTED Final   Escherichia coli DETECTED (A) NOT DETECTED Final    Comment: CRITICAL RESULT CALLED TO, READ BACK BY AND VERIFIED WITH: H TETREAULT RN 12/11/18 0412 JDW    Klebsiella oxytoca NOT DETECTED NOT DETECTED Final   Klebsiella pneumoniae NOT DETECTED NOT DETECTED Final   Proteus species NOT DETECTED NOT DETECTED Final   Serratia marcescens NOT DETECTED NOT DETECTED Final   Carbapenem resistance NOT DETECTED NOT DETECTED Final   Haemophilus influenzae NOT DETECTED NOT DETECTED Final   Neisseria meningitidis NOT DETECTED NOT DETECTED Final   Pseudomonas aeruginosa NOT DETECTED NOT DETECTED Final   Candida albicans NOT DETECTED NOT DETECTED Final   Candida glabrata NOT DETECTED NOT DETECTED Final   Candida krusei NOT DETECTED NOT DETECTED Final   Candida parapsilosis NOT DETECTED NOT DETECTED Final   Candida tropicalis NOT DETECTED NOT DETECTED Final    Comment: Performed at [facility], [place] [address], [place], [place] [address]  SARS Coronavirus 2 by RT PCR (hospital order, performed  in [facility]) Nasopharyngeal Nasopharyngeal Swab     Status: None   Collection Time: 12/10/18 10:03 AM   Specimen: Nasopharyngeal Swab  Result Value Ref Range Status   SARS Coronavirus 2 NEGATIVE NEGATIVE Final    Comment: (NOTE) If result is NEGATIVE SARS-CoV-2 target nucleic acids are NOT DETECTED. The SARS-CoV-2 RNA is generally detectable in upper and lower  respiratory specimens during the acute phase of infection. The lowest  concentration of SARS-CoV-2 viral copies this assay can detect is 250  copies / mL. A negative result does not preclude SARS-CoV-2 infection  and should not be used as the sole  basis for treatment or other  patient management decisions.  A negative result may occur with  improper specimen collection / handling, submission of specimen other  than nasopharyngeal swab, presence of viral mutation(s) within the  areas targeted by this assay, and inadequate number of viral copies  (<250 copies / mL). A negative result must be combined with clinical  observations, patient history, and epidemiological information. If result is POSITIVE SARS-CoV-2 target nucleic acids are DETECTED. The SARS-CoV-2 RNA is generally detectable in upper and lower  respiratory specimens dur ing the acute phase of infection.  Positive  results are indicative of active infection with SARS-CoV-2.  Clinical  correlation with patient history and other diagnostic information is  necessary to determine patient infection status.  Positive results do  not rule out bacterial infection or co-infection with other viruses. If result is PRESUMPTIVE POSTIVE SARS-CoV-2 nucleic acids MAY BE PRESENT.   A presumptive positive result was obtained on the submitted specimen  and confirmed on repeat testing.  While 2019 novel coronavirus  (SARS-CoV-2) nucleic acids may be present in the submitted sample  additional confirmatory testing may be necessary for epidemiological  and / or clinical management purposes  to differentiate between  SARS-CoV-2 and other Sarbecovirus currently known to infect humans.  If clinically indicated additional testing with an alternate test  methodology [phone]) is advised. The SARS-CoV-2 RNA is generally  detectable in upper and lower respiratory sp ecimens during the acute  phase of infection. The expected result is Negative. Fact Sheet for Patients:  StrictlyIdeas.no Fact Sheet for Healthcare Providers: BankingDealers.co.za This test is not yet approved or cleared by the [place] FDA and has been authorized for detection  and/or diagnosis of SARS-CoV-2 by FDA under an Emergency Use Authorization (EUA).  This EUA will remain in effect (meaning this test can be used) for the duration of the COVID-19 declaration under Section 564(b)(1) of the Act, 21 U.S.C. section 360bbb-3(b)(1), unless the authorization is terminated or revoked sooner. Performed at [facility], [address] [place], [place] [address]   Surgical pcr screen     Status: Abnormal   Collection Time: 12/10/18 11:22 AM   Specimen: Nasal Mucosa; Nasal Swab  Result Value Ref Range Status   MRSA, PCR NEGATIVE NEGATIVE Final   Staphylococcus aureus POSITIVE (A) NEGATIVE Final    Comment: RESULT CALLED TO, READ BACK BY AND VERIFIED WITH: SMITH,J ON 12/10/18 AT 1750 BY LOY,C (NOTE) The Xpert SA Assay (FDA approved for NASAL specimens in patients 68 years of age and older), is one component of a comprehensive surveillance program. It is not intended to diagnose infection nor to guide or monitor treatment. Performed at [facility], [address] [place], [place] [address]    Studies/Results: No results found.  Medications:  Prior to Admission:  Medications Prior to Admission  Medication Sig  Dispense Refill Last Dose  . ALPRAZolam (XANAX) 1 MG tablet Take 1 mg by mouth 2 (two) times daily.   12/09/2018 at Unknown time  . benazepril-hydrochlorthiazide (LOTENSIN HCT) 20-12.5 MG tablet Take 1 tablet by mouth daily. 90 tablet 3 12/09/2018 at Unknown time  . Cholecalciferol (VITAMIN D3) 250 MCG (10000 UT) capsule Take 10,000 Units by mouth daily.   12/09/2018 at Unknown time  . cyanocobalamin (,VITAMIN B-12,) 1000 MCG/ML injection INJECT 1ML INTO THE SKIN EVERY 14 DAYS (Patient taking differently: Every 10 days) 6 mL 11 Past Month at Unknown time  . cyclobenzaprine (FLEXERIL) 5 MG tablet TAKE 1 TABLET (5 MG TOTAL) BY MOUTH AT BEDTIME AS NEEDED FOR MUSCLE SPASMS. 30 tablet 1 12/09/2018 at Unknown time  . diclofenac sodium (VOLTAREN) 1 % GEL Apply 1  application topically 4 (four) times daily. 100 g 3 12/09/2018 at Unknown time  . HYDROcodone-acetaminophen (NORCO) 10-325 MG tablet Take 1 tablet by mouth every 8 (eight) hours as needed for severe pain. 90 tablet 0 12/09/2018 at Unknown time  . ketorolac (TORADOL) 10 MG tablet Take 1 tablet (10 mg total) by mouth every 6 (six) hours as needed. for pain 20 tablet 0   . lamoTRIgine (LAMICTAL) 200 MG tablet Take 1 tablet by mouth daily.   12/09/2018 at Unknown time  . norethindrone (AYGESTIN) 5 MG tablet Take 1 tablet (5 mg total) by mouth daily. 90 tablet 3 12/09/2018 at Unknown time  . pantoprazole (PROTONIX) 40 MG tablet 1 PO 30 MINUTES PRIOR TO YOUR FIRST MEAL QD 90 tablet 3 12/09/2018 at Unknown time  . potassium chloride (K-DUR) 10 MEQ tablet TAKE 1 TABLET BY MOUTH EVERY DAY 90 tablet 3 12/09/2018 at Unknown time  . sertraline (ZOLOFT) 100 MG tablet Take 200 mg by mouth daily.    12/09/2018 at Unknown time  . sodium chloride 0.9 % SOLN 250 mL with vedolizumab 300 MG SOLR 300 mg Inject 300 mg into the vein. LOADING DOSE THEN Q8 WEEKS     . SYRINGE-NEEDLE, DISP, 3 ML (B-D SYRINGE/NEEDLE 3CC/25GX5/8) 25G X 5/8" 3 ML MISC Use to administer B12 injection every 14 days 24 each 0   . varenicline (CHANTIX CONTINUING MONTH PAK) 1 MG tablet Take 1 tablet (1 mg total) by mouth 2 (two) times daily. 60 tablet 4 12/09/2018 at Unknown time  . vedolizumab (ENTYVIO) 300 MG injection Inject into the vein. Every 8 weeks     . ciprofloxacin (CIPRO) 500 MG tablet Take 1 tablet (500 mg total) by mouth 2 (two) times daily. (Patient not taking: Reported on 12/10/2018) 28 tablet 1 Completed Course at Unknown time  . fluconazole (DIFLUCAN) 150 MG tablet Take 1 tablet (150 mg total) by mouth every three (3) days as needed. (Patient not taking: Reported on 12/10/2018) 2 tablet 1 Not Taking at Unknown time  . ondansetron (ZOFRAN) 4 MG tablet Take 1 tablet (4 mg total) by mouth every 8 (eight) hours as needed for nausea or  vomiting. (Patient not taking: Reported on 12/10/2018) 20 tablet 0 Not Taking at Unknown time  . promethazine (PHENERGAN) 25 MG tablet Take 1 tablet (25 mg total) by mouth 2 (two) times daily as needed for nausea. (Patient not taking: Reported on 12/10/2018) 60 tablet 0 Not Taking at Unknown time  . varenicline (CHANTIX STARTING MONTH PAK) 0.5 MG X 11 & 1 MG X 42 tablet Take one 0.5 mg tablet by mouth once daily for 3 days, then increase to one 0.5 mg tablet twice  daily for 4 days, then increase to one 1 mg tablet twice daily. (Patient not taking: Reported on 12/10/2018) 53 tablet 0 Completed Course at Unknown time   Scheduled: . ALPRAZolam  0.5 mg Oral BID  . chlorhexidine  15 mL Mouth Rinse BID  . Chlorhexidine Gluconate Cloth  6 each Topical Daily  . ferrous sulfate  325 mg Oral Q breakfast  . fluticasone  1 spray Each Nare Daily  . folic acid  1 mg Oral Daily  . lamoTRIgine  200 mg Oral QHS  . mouth rinse  15 mL Mouth Rinse q12n4p  . mupirocin ointment  1 application Nasal BID  . pantoprazole  40 mg Oral Daily  . sertraline  200 mg Oral QHS  . sodium chloride flush  10-40 mL Intracatheter Q12H  . varenicline  1 mg Oral BID   Continuous: . sodium chloride Stopped (12/14/18 0516)  . sodium chloride Stopped (12/14/18 0000)  . lactated ringers 100 mL/hr at 12/14/18 0646  . norepinephrine (LEVOPHED) Adult infusion Stopped (12/13/18 1500)  . piperacillin-tazobactam (ZOSYN)  IV Stopped (12/14/18 0645)   [facility]:HVFMB/[id number] arterial line **AND** sodium chloride, acetaminophen **OR** acetaminophen, benzonatate, HYDROmorphone (DILAUDID) injection, ondansetron **OR** ondansetron (ZOFRAN) IV, sodium chloride flush  Assesment: She was admitted with septic shock.  This is apparently from E. coli which is in the blood and urine.  She required pressors for about 72 hours and was able to come off yesterday.  Her blood pressure is still soft.  She has pneumonia presumably from aspiration because of  nausea and vomiting during her acute illness.  She is on Zosyn which will cover the E. coli and will also cover for aspiration pneumonia.  She has anemia and continues to have reduction in her hemoglobin level.  Is not clear at this point why she is anemic and that is being investigated.  She is iron deficient and is on iron replacement  She had acute kidney injury on admission presumably from dehydration and the septic shock.  Renal function is back to baseline and in fact perhaps a little better than baseline.  She has Crohn's disease at baseline and is on a biologic but her occult blood 4 days ago was negative.  She has bipolar disease at baseline which is stable Active Problems:   Sepsis ([place])    Plan: Continue current treatments.  She is improving.  She may end up requiring a blood transfusion.      LOS: 4 days   Alonza Bogus 12/14/2018, 7:22 AM

## 2018-12-14 NOTE — Progress Notes (Signed)
PROGRESS NOTE    [name] [name]  QQP:[id number] DOB: 1970/12/19 DOA: 12/10/2018 PCP: Cassandria Anger, MD   Brief Narrative:  Per HPI: [name] Uplinger Curryis a 48 y.o.femalewith medical history significant forbipolar affective disorder, depression/anxiety, hypertension, GERD, and Crohn's disease who began having nausea and vomiting that started on 10/17. She has been vomiting daily and started to feel weak, dizzy, and lightheaded. She feels as though she passed a kidney stone last week as well. She denies any chest pain, shortness of breath, fevers or chills. She has been taking her medications otherwise as usual. She denies any coughing.  10/22:Patient appears to be doing much better this morning and is currently on nasal cannula oxygen. Her norepinephrine is being weaned. She appears to have E. coli bacteremia likely secondary to UTI. She is eager to have a diet. Will need PICC line placement today and femoral line removal this afternoon as it would be a 24 hours.  10/23:Patient overall doing much better this morning. She is on low-dose norepinephrine which is currently being weaned. She is noted to have E. coli bacteremia related to urinary tract infection. Continue IV Zosyn.  10/24: Patient doing well this morning, but remains on low-dose norepinephrine which is still being tapered.  Continues on IV Zosyn with no acute events noted overnight.  She is eager to go home.  10/25: Patient has remained off norepinephrine since 3 PM on 10/24, but continues to have some soft blood pressure measurements.  She remains on nasal cannula oxygen and has not ambulated.  She has some worsening anemia for which I will reevaluate stool occult to ensure that there is no sign of any bleeding prior to discharge.  Anticipate discharge in a.m. if stable hemoglobin levels with no occult bleeding noted.  We will try to wean oxygen today.  Assessment & Plan:   Active Problems:   Sepsis ([place])    Septic shock secondary to multifocal pneumonia with likely aspirationalong with E. coli bacteremia likely secondary to UTI -Continue on Zosyn -MRSA naresnegativeand vancomycin discontinued 10/22 -ContinueIV fluid with LR until norepinephrine weaned -Monitor CBCin a.m. with lactic acid that has down trended -Blood cultureswith E. coli bacteremia likely related to UTI with urine cultures pending -IV LR for maintenance to continue  Acute hypoxemic respiratory failure secondary to multifocal pneumonia along with pulmonary edema-improving -Continue nasal cannula oxygen -Appreciate pulmonology evaluation -We will need continuance of IV fluid for now given septic shock and soft blood pressure readings  [name] secondary to sepsis-resolved -Baseline creatinine 1.3-1.5 noted on prior lab work -Creatinine now 1.2 and pretty much at baseline -Continue IV fluids and monitor I's and O's -Avoid nephrotoxic agents -A.m. renal panel  Intractable nausea and vomiting-resolved -GERD uncertain etiology with possible viral gastroenteritis and/or pneumonia -CT of abdomen with no acute findings -Zofran as needed  Worsening anemia -Recheck stool occult today, no need for transfusion currently -Noted iron deficiency on anemia panel as well as low folate -IV Feraheme given on 71/24 as well as folic acid supplementation that is started -We will start ferrous sulfate -Stool FOBT negative on admission -Baseline hemoglobin noted to be 14.2 previously -Repeat H&H and transfuse if hemoglobin less than 7  Thrombocytopenialikely secondary to splenomegaly-stable -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  Crohn's disease -Hold Entyvio  Chronic pain -Hold oral agents in order IV pain medications as needed  Anxiety/depression  -Continue home medications  Bipolar affective disorder -Continue home medications  GERD -PPI to oral  Hypertension -Hold home blood pressure agents on account of shock physiology -Norepinephrine has been discontinued on 10/24   DVT prophylaxis:SCDs Code Status:Full Family Communication:Discussed with husband at bedside on 10/21 Disposition Plan:Continue to monitor blood pressures, wean oxygen and ambulate.  Monitor hemoglobin in a.m.  Stool occult pending.   Consultants:  Pulmonology  Procedures:  Central venous line to right femoral region on admission  PICC line placement 10/22  Antimicrobials:  Anti-infectives (From admission, onward)   Start     Dose/Rate Route Frequency Ordered Stop   12/12/18 1200  vancomycin (VANCOCIN) IVPB 1000 mg/200 mL premix  Status:  Discontinued     1,000 mg 200 mL/hr over 60 Minutes Intravenous Every 48 hours 12/10/18 1226 12/11/18 0759   12/11/18 1100  piperacillin-tazobactam (ZOSYN) IVPB 3.375 g     3.375 g 12.5 mL/hr over 240 Minutes Intravenous Every 8 hours 12/11/18 0902     12/11/18 0900  piperacillin-tazobactam (ZOSYN) IVPB 3.375 g  Status:  Discontinued     3.375 g 12.5 mL/hr over 240 Minutes Intravenous Every 8 hours 12/11/18 0859 12/11/18 0901   12/11/18 0000  piperacillin-tazobactam (ZOSYN) IVPB 3.375 g  Status:  Discontinued     3.375 g 12.5 mL/hr over 240 Minutes Intravenous Every 12 hours 12/10/18 1245 12/11/18 0859   12/10/18 1300  vancomycin (VANCOCIN) IVPB 750 mg/150 ml premix     750 mg 150 mL/hr over 60 Minutes Intravenous  Once 12/10/18 1136 12/10/18 1452   12/10/18 1200  vancomycin (VANCOCIN) IVPB 1000 mg/200 mL premix     1,000 mg 200 mL/hr over 60 Minutes Intravenous  Once 12/10/18 1136 12/10/18 1322   12/10/18 1145  piperacillin-tazobactam (ZOSYN) IVPB 3.375 g     3.375 g 100 mL/hr over 30 Minutes Intravenous  Once 12/10/18 1139 12/10/18 1246   12/10/18 0930  levofloxacin (LEVAQUIN) IVPB 750 mg      750 mg 100 mL/hr over 90 Minutes Intravenous  Once 12/10/18 0916 12/10/18 1204       Subjective: Patient seen and evaluated today with no new acute complaints or concerns. No acute concerns or events noted overnight.  She is eager to go home.  No bloody bowel movements or dark stools noted.  Denies abdominal pain.  Objective: Vitals:   12/14/18 0900 12/14/18 1000 12/14/18 1100 12/14/18 1146  BP: (!) 106/57     Pulse: (!) 101 100 (!) 105 100  Resp: 20 (!) 28 (!) 27 (!) 23  Temp:    98.5 F (36.9 C)  TempSrc:    Oral  SpO2: 92% 93% 91% 96%  Weight:      Height:        Intake/Output Summary (Last 24 hours) at 12/14/2018 1419 Last data filed at 12/14/2018 1100 Gross per 24 hour  Intake 2890.06 ml  Output -  Net 2890.06 ml   Filed Weights   12/12/18 0500 12/13/18 0510 12/14/18 0500  Weight: 72.5 kg 76 kg 75.6 kg    Examination:  General exam: Appears calm and comfortable  Respiratory system: Clear to auscultation. Respiratory effort normal.  Currently on nasal cannula oxygen. Cardiovascular system: S1 & S2 heard, RRR. No JVD, murmurs, rubs, gallops or clicks. No pedal edema. Gastrointestinal system: Abdomen is nondistended, soft and nontender. No organomegaly or masses felt. Normal bowel sounds heard. Central nervous system: Alert and oriented. No focal neurological deficits. Extremities: Symmetric 5 x 5 power.  Skin: No rashes, lesions or ulcers Psychiatry: Judgement and insight appear normal. Mood & affect appropriate.     Data Reviewed: I have personally reviewed following labs and imaging studies  CBC: Recent Labs  Lab 12/10/18 0619  12/11/18 0419 12/12/18 0413 12/13/18 0415 12/14/18 0507 12/14/18 0829  WBC 4.0  --  4.3 4.3 4.8 4.3  --   NEUTROABS 3.2  --   --   --   --   --   --   HGB 8.7*   < > 9.5* 8.5* 8.2* 7.2* 7.6*  HCT 26.2*   < > 28.4* 25.9* 25.0* 22.5* 23.8*  MCV 95.6  --  93.4 93.5 93.3 95.3  --   PLT 72*  --  79* 75* 117* 132*  --    < > =  values in this interval not displayed.   Basic Metabolic Panel: Recent Labs  Lab 12/10/18 0619 12/11/18 0419 12/12/18 0413 12/13/18 0415 12/14/18 0507  NA 134* 138 137 141 141  K 3.5 3.6 3.4* 3.6 3.5  CL 103 110 107 105 104  CO2 20* 17* 23 26 27   GLUCOSE 117* 148* 129* 112* 90  BUN 43* 32* 20 16 16   CREATININE 4.54* 2.41* 1.53* 1.27* 1.20*  CALCIUM 7.4* 7.3* 7.9* 8.3* 8.4*  MG  --  1.6* 1.6* 1.4* 1.6*   GFR: Estimated Creatinine Clearance: 54 mL/min (A) (by C-G formula based on SCr of 1.2 mg/dL (H)). Liver Function Tests: Recent Labs  Lab 12/10/18 0619 12/11/18 0419  AST 12* 15  ALT 13 12  ALKPHOS 28* 28*  BILITOT 0.6 1.1  PROT 5.5* 5.4*  ALBUMIN 2.9* 2.8*   Recent Labs  Lab 12/10/18 0619  LIPASE 14   No results for input(s): AMMONIA in the last 168 hours. Coagulation Profile: No results for input(s): INR, PROTIME in the last 168 hours. Cardiac Enzymes: No results for input(s): CKTOTAL, CKMB, CKMBINDEX, TROPONINI in the last 168 hours. BNP (last 3 results) Recent Labs    08/25/18 0844  PROBNP 70.0   HbA1C: No results for input(s): HGBA1C in the last 72 hours. CBG: No results for input(s): GLUCAP in the last 168 hours. Lipid Profile: No results for input(s): CHOL, HDL, LDLCALC, TRIG, CHOLHDL, LDLDIRECT in the last 72 hours. Thyroid Function Tests: No results for input(s): TSH, T4TOTAL, FREET4, T3FREE, THYROIDAB in the last 72 hours. Anemia Panel: No results for input(s): VITAMINB12, FOLATE, FERRITIN, TIBC, IRON, RETICCTPCT in the last 72 hours. Sepsis Labs: Recent Labs  Lab 12/10/18 6803 12/10/18 0752 12/10/18 1233 12/11/18 0419  LATICACIDVEN 0.9 2.2* 1.2 0.9    Recent Results (from the past 240 hour(s))  Urine culture     Status: Abnormal   Collection Time: 12/10/18  8:32 AM   Specimen: Urine, Clean Catch  Result Value Ref Range Status   Specimen Description   Final    URINE, CLEAN CATCH Performed at [facility], [address],  [place], [place] [address]    Special Requests   Final    NONE Performed at [facility], [address] [place], [place] [address]    Culture >=100,000 COLONIES/mL ESCHERICHIA COLI (A)  Final   Report Status 12/12/2018 FINAL  Final   Organism ID, Bacteria ESCHERICHIA COLI (A)  Final      Susceptibility   Escherichia coli - MIC*    AMPICILLIN >=32 RESISTANT Resistant     CEFAZOLIN 16 SENSITIVE Sensitive     CEFTRIAXONE <=1 SENSITIVE Sensitive     CIPROFLOXACIN <=  0.25 SENSITIVE Sensitive     GENTAMICIN <=1 SENSITIVE Sensitive     IMIPENEM <=0.25 SENSITIVE Sensitive     NITROFURANTOIN <=16 SENSITIVE Sensitive     TRIMETH/SULFA <=20 SENSITIVE Sensitive     AMPICILLIN/SULBACTAM >=32 RESISTANT Resistant     PIP/TAZO <=4 SENSITIVE Sensitive     Extended ESBL NEGATIVE Sensitive     * >=100,000 COLONIES/mL ESCHERICHIA COLI  Culture, blood (routine x 2)     Status: None (Preliminary result)   Collection Time: 12/10/18  9:50 AM   Specimen: BLOOD LEFT HAND  Result Value Ref Range Status   Specimen Description BLOOD LEFT HAND  Final   Special Requests   Final    BOTTLES DRAWN AEROBIC AND ANAEROBIC Blood Culture adequate volume   Culture   Final    NO GROWTH 4 DAYS Performed at [facility], [address], [place], [place] [address]    Report Status PENDING  Incomplete  Culture, blood (routine x 2)     Status: Abnormal   Collection Time: 12/10/18  9:51 AM   Specimen: BLOOD RIGHT HAND  Result Value Ref Range Status   Specimen Description   Final    BLOOD RIGHT HAND Performed at [facility], [address], [place], [place] [address]    Special Requests   Final    BOTTLES DRAWN AEROBIC AND ANAEROBIC Blood Culture results may not be optimal due to an inadequate volume of blood received in culture bottles Performed at [facility], [address] [place], [place] [address]    Culture  Setup Time   Final    IN BOTH AEROBIC AND ANAEROBIC BOTTLES GRAM NEGATIVE RODS Gram Stain Report Called  to,Read Back By and Verified With: J HEARN,RN @2248  12/10/18 MKELLY CRITICAL RESULT CALLED TO, READ BACK BY AND VERIFIED WITH: Quentin Angst RN 12/11/18 3810 JDW Performed at [facility], [place] [address] [place], [place] [address]    Culture ESCHERICHIA COLI (A)  Final   Report Status 12/13/2018 FINAL  Final   Organism ID, Bacteria ESCHERICHIA COLI  Final      Susceptibility   Escherichia coli - MIC*    AMPICILLIN >=32 RESISTANT Resistant     CEFAZOLIN 16 SENSITIVE Sensitive     CEFEPIME <=1 SENSITIVE Sensitive     CEFTAZIDIME <=1 SENSITIVE Sensitive     CEFTRIAXONE <=1 SENSITIVE Sensitive     CIPROFLOXACIN <=0.25 SENSITIVE Sensitive     GENTAMICIN <=1 SENSITIVE Sensitive     IMIPENEM <=0.25 SENSITIVE Sensitive     TRIMETH/SULFA <=20 SENSITIVE Sensitive     AMPICILLIN/SULBACTAM >=32 RESISTANT Resistant     PIP/TAZO <=4 SENSITIVE Sensitive     Extended ESBL NEGATIVE Sensitive     * ESCHERICHIA COLI  Blood Culture ID Panel (Reflexed)     Status: Abnormal   Collection Time: 12/10/18  9:51 AM  Result Value Ref Range Status   Enterococcus species NOT DETECTED NOT DETECTED Final   Listeria monocytogenes NOT DETECTED NOT DETECTED Final   Staphylococcus species NOT DETECTED NOT DETECTED Final   Staphylococcus aureus (BCID) NOT DETECTED NOT DETECTED Final   Streptococcus species NOT DETECTED NOT DETECTED Final   Streptococcus agalactiae NOT DETECTED NOT DETECTED Final   Streptococcus pneumoniae NOT DETECTED NOT DETECTED Final   Streptococcus pyogenes NOT DETECTED NOT DETECTED Final   Acinetobacter baumannii NOT DETECTED NOT DETECTED Final   Enterobacteriaceae species DETECTED (A) NOT DETECTED Final    Comment: Enterobacteriaceae represent a large family of gram-negative bacteria, not  a single organism. CRITICAL RESULT CALLED TO, READ BACK BY AND VERIFIED WITH: H TETREAULT RN 12/11/18 0412 JDW    Enterobacter cloacae complex NOT DETECTED NOT DETECTED Final   Escherichia coli  DETECTED (A) NOT DETECTED Final    Comment: CRITICAL RESULT CALLED TO, READ BACK BY AND VERIFIED WITH: H TETREAULT RN 12/11/18 0412 JDW    Klebsiella oxytoca NOT DETECTED NOT DETECTED Final   Klebsiella pneumoniae NOT DETECTED NOT DETECTED Final   Proteus species NOT DETECTED NOT DETECTED Final   Serratia marcescens NOT DETECTED NOT DETECTED Final   Carbapenem resistance NOT DETECTED NOT DETECTED Final   Haemophilus influenzae NOT DETECTED NOT DETECTED Final   Neisseria meningitidis NOT DETECTED NOT DETECTED Final   Pseudomonas aeruginosa NOT DETECTED NOT DETECTED Final   Candida albicans NOT DETECTED NOT DETECTED Final   Candida glabrata NOT DETECTED NOT DETECTED Final   Candida krusei NOT DETECTED NOT DETECTED Final   Candida parapsilosis NOT DETECTED NOT DETECTED Final   Candida tropicalis NOT DETECTED NOT DETECTED Final    Comment: Performed at [facility], [place] [address] [place], [place] [address]  SARS Coronavirus 2 by RT PCR (hospital order, performed in [facility] hospital lab) Nasopharyngeal Nasopharyngeal Swab     Status: None   Collection Time: 12/10/18 10:03 AM   Specimen: Nasopharyngeal Swab  Result Value Ref Range Status   SARS Coronavirus 2 NEGATIVE NEGATIVE Final    Comment: (NOTE) If result is NEGATIVE SARS-CoV-2 target nucleic acids are NOT DETECTED. The SARS-CoV-2 RNA is generally detectable in upper and lower  respiratory specimens during the acute phase of infection. The lowest  concentration of SARS-CoV-2 viral copies this assay can detect is 250  copies / mL. A negative result does not preclude SARS-CoV-2 infection  and should not be used as the sole basis for treatment or other  patient management decisions.  A negative result may occur with  improper specimen collection / handling, submission of specimen other  than nasopharyngeal swab, presence of viral mutation(s) within the  areas targeted by this assay, and inadequate number of viral copies   (<250 copies / mL). A negative result must be combined with clinical  observations, patient history, and epidemiological information. If result is POSITIVE SARS-CoV-2 target nucleic acids are DETECTED. The SARS-CoV-2 RNA is generally detectable in upper and lower  respiratory specimens dur ing the acute phase of infection.  Positive  results are indicative of active infection with SARS-CoV-2.  Clinical  correlation with patient history and other diagnostic information is  necessary to determine patient infection status.  Positive results do  not rule out bacterial infection or co-infection with other viruses. If result is PRESUMPTIVE POSTIVE SARS-CoV-2 nucleic acids MAY BE PRESENT.   A presumptive positive result was obtained on the submitted specimen  and confirmed on repeat testing.  While 2019 novel coronavirus  (SARS-CoV-2) nucleic acids may be present in the submitted sample  additional confirmatory testing may be necessary for epidemiological  and / or clinical management purposes  to differentiate between  SARS-CoV-2 and other Sarbecovirus currently known to infect humans.  If clinically indicated additional testing with an alternate test  methodology [phone]) is advised. The SARS-CoV-2 RNA is generally  detectable in upper and lower respiratory sp ecimens during the acute  phase of infection. The expected result is Negative. Fact Sheet for Patients:  StrictlyIdeas.no Fact Sheet for Healthcare Providers: BankingDealers.co.za This test is not yet approved or cleared by the [place] FDA  and has been authorized for detection and/or diagnosis of SARS-CoV-2 by FDA under an Emergency Use Authorization (EUA).  This EUA will remain in effect (meaning this test can be used) for the duration of the COVID-19 declaration under Section 564(b)(1) of the Act, 21 U.S.C. section 360bbb-3(b)(1), unless the authorization is terminated or  revoked sooner. Performed at [facility], [address], [place], [place] [address]   Surgical pcr screen     Status: Abnormal   Collection Time: 12/10/18 11:22 AM   Specimen: Nasal Mucosa; Nasal Swab  Result Value Ref Range Status   MRSA, PCR NEGATIVE NEGATIVE Final   Staphylococcus aureus POSITIVE (A) NEGATIVE Final    Comment: RESULT CALLED TO, READ BACK BY AND VERIFIED WITH: SMITH,J ON 12/10/18 AT 1750 BY LOY,C (NOTE) The Xpert SA Assay (FDA approved for NASAL specimens in patients 39 years of age and older), is one component of a comprehensive surveillance program. It is not intended to diagnose infection nor to guide or monitor treatment. Performed at [facility], [address] [place], [place] [address]          Radiology Studies: No results found.      Scheduled Meds: . ALPRAZolam  0.5 mg Oral BID  . chlorhexidine  15 mL Mouth Rinse BID  . Chlorhexidine Gluconate Cloth  6 each Topical Daily  . ferrous sulfate  325 mg Oral Q breakfast  . fluticasone  1 spray Each Nare Daily  . folic acid  1 mg Oral Daily  . lamoTRIgine  200 mg Oral QHS  . mouth rinse  15 mL Mouth Rinse q12n4p  . mupirocin ointment  1 application Nasal BID  . pantoprazole  40 mg Oral Daily  . sertraline  200 mg Oral QHS  . sodium chloride flush  10-40 mL Intracatheter Q12H  . varenicline  1 mg Oral BID   Continuous Infusions: . sodium chloride Stopped (12/14/18 0516)  . sodium chloride 10 mL/hr at 12/14/18 1137  . norepinephrine (LEVOPHED) Adult infusion Stopped (12/13/18 1500)  . piperacillin-tazobactam (ZOSYN)  IV 3.375 g (12/14/18 1139)     LOS: 4 days    Time spent: 30 minutes    Pratik Darleen Crocker, DO [facility] Pager [phone]  If 7PM-7AM, please contact night-coverage [email or web address] Password [email or web address] 12/14/2018, 2:19 PM

## 2018-12-15 LAB — BASIC METABOLIC PANEL
Anion gap: 9 (ref 5–15)
BUN: 14 mg/dL (ref 6–20)
CO2: 29 mmol/L (ref 22–32)
Calcium: 8.4 mg/dL — ABNORMAL LOW (ref 8.9–10.3)
Chloride: 104 mmol/L (ref 98–111)
Creatinine, Ser: 1.19 mg/dL — ABNORMAL HIGH (ref 0.44–1.00)
GFR calc Af Amer: >60 mL/min (ref >60)
GFR calc non Af Amer: 54 mL/min — ABNORMAL LOW (ref >60)
Glucose, Bld: 91 mg/dL (ref 70–99)
Potassium: 3.5 mmol/L (ref 3.5–5.1)
Sodium: 142 mmol/L (ref 135–145)

## 2018-12-15 LAB — CBC
HCT: 22.6 % — ABNORMAL LOW (ref 36.0–46.0)
Hemoglobin: 7.2 g/dL — ABNORMAL LOW (ref 12.0–15.0)
MCH: 30.6 pg (ref 26.0–34.0)
MCHC: 31.9 g/dL (ref 30.0–36.0)
MCV: 96.2 fL (ref 80.0–100.0)
Platelets: 158 10*3/uL (ref 150–400)
RBC: 2.35 MIL/uL — ABNORMAL LOW (ref 3.87–5.11)
RDW: 13.3 % (ref 11.5–15.5)
WBC: 4.1 10*3/uL (ref 4.0–10.5)
nRBC: 0 % (ref 0.0–0.2)

## 2018-12-15 LAB — CULTURE, BLOOD (ROUTINE X 2)
Culture: NO GROWTH
Special Requests: ADEQUATE

## 2018-12-15 MED ORDER — FOLIC ACID 1 MG PO TABS: 1.0000 mg | ORAL_TABLET | Freq: Every day | ORAL | 0 refills | Status: AC

## 2018-12-15 MED ORDER — FERROUS SULFATE 325 (65 FE) MG PO TABS: 325.0000 mg | ORAL_TABLET | Freq: Every day | ORAL | 0 refills | Status: DC

## 2018-12-15 MED ORDER — LEVOFLOXACIN 750 MG PO TABS
750.0000 mg | ORAL_TABLET | Freq: Every day | ORAL | Status: DC
  Administered 2018-12-15: 750 mg via ORAL
  Filled 2018-12-15: qty 1

## 2018-12-15 MED ORDER — LEVOFLOXACIN 750 MG PO TABS: 750.0000 mg | ORAL_TABLET | Freq: Every day | ORAL | 0 refills | Status: AC

## 2018-12-15 NOTE — Progress Notes (Signed)
Pt belongings returned at this time. D/c instructions, medication management, and follow up care reviewed. Spouse and pt deny questions or concerns at this time. Oxygen tank delivered and given to pt at this time. Instructed pt and spouse on oxygen use, how to manage tank, and when to call oxygen supplier. Oxygen supplier information given to pt at this time to call for in home oxygen delivery. Denies any questions or concerns at this time. Fellow RN removed PICC line at this time. Pt to go to front entrance for d/c via wheelchair.

## 2018-12-15 NOTE — Discharge Summary (Signed)
Physician Discharge Summary  [name] [name] WLN:[id number] DOB: 02/12/1971 DOA: 12/10/2018  PCP: Cassandria Anger, MD  Admit date: 12/10/2018  Discharge date: 12/15/2018  Admitted From:Home  Disposition:  Home  Recommendations for Outpatient Follow-up:  1. Follow up with PCP in 1-2 weeks 2. Please obtain BMP/CBC in one week 3. Follow-up with gastroenterology Dr. Oneida Alar in 2 weeks for outpatient endoscopy to evaluate anemia.  She does have iron deficiency and has been given Feraheme with iron supplementation.  No overt bleeding identified during this admission.  Hemoglobin has remained stable at 7.2. 4. Continue on Levaquin as prescribed for 2 more days to complete course of treatment for E. coli bacteremia with UTI as well as multifocal pneumonia with suspected aspiration. 5. Continue on PPI daily. 6. Hold home blood pressure medications until further follow-up with PCP.  Home Health:None  Equipment/Devices:2L [employer] Oxygen  Discharge Condition:Stable  CODE STATUS: Full  Diet recommendation: Heart Healthy  Brief/Interim Summary: Per HPI: [name] Verville Curryis a 48 y.o.femalewith medical history significant forbipolar affective disorder, depression/anxiety, hypertension, GERD, and Crohn's disease who began having nausea and vomiting that started on 10/17. She has been vomiting daily and started to feel weak, dizzy, and lightheaded. She feels as though she passed a kidney stone last week as well. She denies any chest pain, shortness of breath, fevers or chills. She has been taking her medications otherwise as usual. She denies any coughing.  10/22:Patient appears to be doing much better this morning and is currently on nasal cannula oxygen. Her norepinephrine is being weaned. She appears to have E. coli bacteremia likely secondary to UTI. She is eager to have a diet. Will need PICC line placement today and femoral line removal this afternoon as it would be a 24  hours.  10/23:Patient overall doing much better this morning. She is on low-dose norepinephrine which is currently being weaned. She is noted to have E. coli bacteremia related to urinary tract infection. Continue IV Zosyn.  10/24:Patient doing well this morning, but remains on low-dose norepinephrine which is still being tapered. Continues on IV Zosyn with no acute events noted overnight. She is eager to go home.  10/25: Patient has remained off norepinephrine since 3 PM on 10/24, but continues to have some soft blood pressure measurements.  She remains on nasal cannula oxygen and has not ambulated.  She has some worsening anemia for which I will reevaluate stool occult to ensure that there is no sign of any bleeding prior to discharge.  Anticipate discharge in a.m. if stable hemoglobin levels with no occult bleeding noted.  We will try to wean oxygen today.  10/26: Patient continues to do well this morning with no symptomatic complaints or concerns.  Hemoglobin remains low at 7.2, but this has been stable with no signs of overt bleeding identified.  She has remained off norepinephrine and has ambulated with some hypoxemia noted for which she will be discharged on 2 L nasal cannula oxygen.  She is eager to go home and will be discharged on Levaquin for 2 more days to complete course of treatment.  I have discussed with her the need to follow-up with her gastroenterologist Dr. Oneida Alar for endoscopy in the outpatient setting given her anemia.  She will require repeat blood work in the next 1 week with her PCP to ensure stability as well.  She is to hold her home blood pressure medications until further follow-up with PCP.  She will be discharged after she receives her home  2 L nasal cannula oxygen.  Discharge Diagnoses:  Active Problems:   Sepsis ([place])  Principal discharge diagnosis: Septic shock secondary to E. coli bacteremia related to UTI as well as multifocal pneumonia with  aspiration.  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   cyanocobalamin 1000 MCG/ML injection Commonly known as: (VITAMIN B-12) INJECT 1ML INTO THE SKIN EVERY 14 DAYS What changed: See the new instructions.   cyclobenzaprine 5 MG tablet Commonly known as: FLEXERIL TAKE 1 TABLET (5 MG TOTAL) BY MOUTH AT BEDTIME AS NEEDED FOR MUSCLE SPASMS.   diclofenac sodium 1 % Gel Commonly known as: Voltaren Apply 1 application topically 4 (four) times daily.   Entyvio 300 MG injection Generic drug: vedolizumab Inject into the vein. Every 8 weeks   ferrous sulfate 325 (65 FE) MG tablet Take 1 tablet (325 mg total) by mouth daily with breakfast. Start taking on: December 16, 8339   folic acid 1 MG tablet Commonly known as: FOLVITE Take 1 tablet (1 mg total) by mouth daily. Start taking on: December 16, 2018   HYDROcodone-acetaminophen 10-325 MG tablet Commonly known as: NORCO Take 1 tablet by mouth every 8 (eight) hours as needed for severe pain.   lamoTRIgine 200 MG tablet Commonly  known as: LAMICTAL Take 1 tablet by mouth daily.   levofloxacin 750 MG tablet Commonly known as: LEVAQUIN Take 1 tablet (750 mg total) by mouth daily for 2 days. Start taking on: December 16, 2018   norethindrone 5 MG tablet Commonly known as: AYGESTIN Take 1 tablet (5 mg total) by mouth daily.   ondansetron 4 MG tablet Commonly known as: ZOFRAN Take 1 tablet (4 mg total) by mouth every 8 (eight) hours as needed for nausea or vomiting.   pantoprazole 40 MG tablet Commonly known as: Protonix 1 PO 30 MINUTES PRIOR TO YOUR FIRST MEAL QD   potassium chloride 10 MEQ tablet Commonly known as: KLOR-CON TAKE 1 TABLET BY MOUTH EVERY DAY   promethazine 25 MG tablet Commonly known as: PHENERGAN Take 1 tablet (25 mg total) by mouth 2 (two) times daily as needed for nausea.   sertraline 100 MG tablet Commonly known as: ZOLOFT Take 200 mg by mouth daily.   sodium chloride 0.9 % SOLN 250 mL with vedolizumab 300 MG SOLR 300 mg Inject 300 mg into the vein. LOADING DOSE THEN Q8 WEEKS   SYRINGE-NEEDLE (DISP) 3 ML 25G  X 5/8" 3 ML Misc Commonly known as: B-D SYRINGE/NEEDLE 3CC/25GX5/8 Use to administer B12 injection every 14 days   varenicline 1 MG tablet Commonly known as: Chantix Continuing Month Pak Take 1 tablet (1 mg total) by mouth 2 (two) times daily.   Chantix Starting Month Pak 0.5 MG X 11 & 1 MG X 42 tablet Generic drug: varenicline Take one 0.5 mg tablet by mouth once daily for 3 days, then increase to one 0.5 mg tablet twice daily for 4 days, then increase to one 1 mg tablet twice daily.   Vitamin D3 250 MCG (10000 UT) capsule Take 10,000 Units by mouth daily.            Durable Medical Equipment  (From admission, onward)         Start     Ordered   12/15/18 1029  DME Oxygen  Once    Question Answer Comment  Length of Need 6 Months   Mode or (Route) Nasal cannula   Liters per Minute 2   Frequency Continuous (stationary and portable oxygen unit needed)   Oxygen  conserving device Yes   Oxygen delivery system Gas      12/15/18 1028         Follow-up Information    Plotnikov, Evie Lacks, MD Follow up in 1 week(s).   Specialty: Internal Medicine Contact information: [place] [address] [phone]        Danie Binder, MD Follow up in 2 week(s).   Specialty: Gastroenterology Contact information: [place] [address] [phone]          Allergies  Allergen Reactions  . Cephalexin     itching  . Codeine   . Guaifenesin   . Imitrex [Sumatriptan]     2 fingers got very hot  . Morphine   . 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 Sulfonamide Derivatives   . 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 History of kidney stones. History of Crohn's disease and rectovaginal fistula. EXAM: CT CHEST, ABDOMEN AND PELVIS WITHOUT CONTRAST TECHNIQUE: Multidetector CT imaging of the chest, abdomen and pelvis was performed following the standard protocol without IV contrast. COMPARISON:  Chest radiograph dated 12/10/2018 and CT of the abdomen pelvis dated 07/23/2018. FINDINGS: Evaluation of this exam is limited in the absence of intravenous contrast. CT CHEST FINDINGS Cardiovascular: There is no cardiomegaly or pericardial effusion. The thoracic aorta and the central pulmonary arteries are unremarkable. Mediastinum/Nodes: No hilar or mediastinal adenopathy. The esophagus and the thyroid gland are grossly unremarkable. No mediastinal fluid collection. Lungs/Pleura: There is diffuse interstitial and interlobular septal thickening consistent with edema. Bilateral, upper lobe predominant, patchy airspace opacities most consistent with multifocal pneumonia. Trace  bilateral pleural effusions noted. There is no pneumothorax. The central airways are patent. Musculoskeletal: No chest wall mass or suspicious bone lesions identified. CT ABDOMEN PELVIS FINDINGS No intra-abdominal free air or free fluid. Hepatobiliary: The liver is unremarkable. No intrahepatic biliary ductal dilatation. The gallbladder is unremarkable. Pancreas: Unremarkable. No pancreatic ductal dilatation or surrounding inflammatory changes. Spleen: Splenomegaly measuring approximately 19 cm in craniocaudal length. Adrenals/Urinary Tract: The adrenal glands are unremarkable. Mild bilateral renal parenchyma atrophy. There is no hydronephrosis or nephrolithiasis on  either side. The visualized ureters and urinary bladder appear unremarkable. Stomach/Bowel: Several small scattered colonic diverticula without active inflammatory changes. There is no bowel obstruction or active inflammation. Normal appendix. Vascular/Lymphatic: The abdominal aorta and IVC are grossly unremarkable on this noncontrast CT. No portal venous gas. There is no adenopathy. Reproductive: The uterus is anteverted and grossly unremarkable. No adnexal masses. Other: A 2.1 x 1.5 cm nodular density posterior to the rectum likely represents scarring related to prior inflammatory changes or collection. No drainable fluid identified. No air is seen within this area. Musculoskeletal: No acute or significant osseous findings. IMPRESSION: 1. Pulmonary edema with superimposed multifocal pneumonia. Clinical correlation and follow-up to resolution recommended. 2. No acute intra-abdominal or pelvic pathology. No bowel obstruction or active inflammation. Normal appendix. 3. Splenomegaly. 4. Posterior perirectal scarring.  No drainable fluid collection. Electronically Signed   By: Anner Crete M.D.   On: 12/10/2018 10:58   Ct Chest Wo Contrast  Result Date: 12/10/2018 CLINICAL DATA:  48 year old female with weakness dizziness. History of kidney stones.  History of Crohn's disease and rectovaginal fistula. EXAM: CT CHEST, ABDOMEN AND PELVIS WITHOUT CONTRAST TECHNIQUE: Multidetector CT imaging of the chest, abdomen and pelvis was performed following the standard protocol without IV contrast. COMPARISON:  Chest radiograph dated 12/10/2018 and CT of the abdomen pelvis dated 07/23/2018. FINDINGS: Evaluation of this exam is limited in the absence of intravenous contrast. CT CHEST FINDINGS Cardiovascular: There is no cardiomegaly or pericardial effusion. The thoracic aorta and the central pulmonary arteries are unremarkable. Mediastinum/Nodes: No hilar or mediastinal adenopathy. The esophagus and the thyroid gland are grossly unremarkable. No mediastinal fluid collection. Lungs/Pleura: There is diffuse interstitial and interlobular septal thickening consistent with edema. Bilateral, upper lobe predominant, patchy airspace opacities most consistent with multifocal pneumonia. Trace bilateral pleural effusions noted. There is no pneumothorax. The central airways are patent. Musculoskeletal: No chest wall mass or suspicious bone lesions identified. CT ABDOMEN PELVIS FINDINGS No intra-abdominal free air or free fluid. Hepatobiliary: The liver is unremarkable. No intrahepatic biliary ductal dilatation. The gallbladder is unremarkable. Pancreas: Unremarkable. No pancreatic ductal dilatation or surrounding inflammatory changes. Spleen: Splenomegaly measuring approximately 19 cm in craniocaudal length. Adrenals/Urinary Tract: The adrenal glands are unremarkable. Mild bilateral renal parenchyma atrophy. There is no hydronephrosis or nephrolithiasis on either side. The visualized ureters and urinary bladder appear unremarkable. Stomach/Bowel: Several small scattered colonic diverticula without active inflammatory changes. There is no bowel obstruction or active inflammation. Normal appendix. Vascular/Lymphatic: The abdominal aorta and IVC are grossly unremarkable on this noncontrast  CT. No portal venous gas. There is no adenopathy. Reproductive: The uterus is anteverted and grossly unremarkable. No adnexal masses. Other: A 2.1 x 1.5 cm nodular density posterior to the rectum likely represents scarring related to prior inflammatory changes or collection. No drainable fluid identified. No air is seen within this area. Musculoskeletal: No acute or significant osseous findings. IMPRESSION: 1. Pulmonary edema with superimposed multifocal pneumonia. Clinical correlation and follow-up to resolution recommended. 2. No acute intra-abdominal or pelvic pathology. No bowel obstruction or active inflammation. Normal appendix. 3. Splenomegaly. 4. Posterior perirectal scarring.  No drainable fluid collection. Electronically Signed   By: Anner Crete M.D.   On: 12/10/2018 10:58   Dg Chest Portable 1 View  Result Date: 12/10/2018 CLINICAL DATA:  Shortness of breath. EXAM: PORTABLE CHEST 1 VIEW COMPARISON:  December 19, 2006. FINDINGS: The heart size and mediastinal contours are within normal limits. No pneumothorax or pleural effusion is noted. Mildly increased interstitial densities are  noted throughout both lungs concerning for possible pulmonary edema. The visualized skeletal structures are unremarkable. IMPRESSION: Mildly increased interstitial densities are noted throughout both lungs concerning for possible pulmonary edema. Electronically Signed   By: Marijo Conception M.D.   On: 12/10/2018 09:34   [place] Ekg Site Rite  Result Date: 12/11/2018 If Site Rite image not attached, placement could not be confirmed due to current cardiac rhythm.    Discharge Exam: Vitals:   12/15/18 0815 12/15/18 0900  BP:  120/70  Pulse: 97 88  Resp: (!) 28 (!) 32  Temp:    SpO2: 91% 95%   Vitals:   12/15/18 0600 12/15/18 0752 12/15/18 0815 12/15/18 0900  BP: 112/66   120/70  Pulse: 84  97 88  Resp: (!) 25  (!) 28 (!) 32  Temp:  97.8 F (36.6 C)    TempSrc:  Oral    SpO2: 98%  91% 95%  Weight:       Height:        General: Pt is alert, awake, not in acute distress Cardiovascular: RRR, S1/S2 +, no rubs, no gallops Respiratory: CTA bilaterally, no wheezing, no rhonchi, on 2 L nasal cannula oxygen Abdominal: Soft, NT, ND, bowel sounds + Extremities: no edema, no cyanosis    The results of significant diagnostics from this hospitalization (including imaging, microbiology, ancillary and laboratory) are listed below for reference.     Microbiology: Recent Results (from the past 240 hour(s))  Urine culture     Status: Abnormal   Collection Time: 12/10/18  8:32 AM   Specimen: Urine, Clean Catch  Result Value Ref Range Status   Specimen Description   Final    URINE, CLEAN CATCH Performed at [facility], [address] [place], [place] [address]    Special Requests   Final    NONE Performed at [facility], [address] [place], [place] [address]    Culture >=100,000 COLONIES/mL ESCHERICHIA COLI (A)  Final   Report Status 12/12/2018 FINAL  Final   Organism ID, Bacteria ESCHERICHIA COLI (A)  Final      Susceptibility   Escherichia coli - MIC*    AMPICILLIN >=32 RESISTANT Resistant     CEFAZOLIN 16 SENSITIVE Sensitive     CEFTRIAXONE <=1 SENSITIVE Sensitive     CIPROFLOXACIN <=0.25 SENSITIVE Sensitive     GENTAMICIN <=1 SENSITIVE Sensitive     IMIPENEM <=0.25 SENSITIVE Sensitive     NITROFURANTOIN <=16 SENSITIVE Sensitive     TRIMETH/SULFA <=20 SENSITIVE Sensitive     AMPICILLIN/SULBACTAM >=32 RESISTANT Resistant     PIP/TAZO <=4 SENSITIVE Sensitive     Extended ESBL NEGATIVE Sensitive     * >=100,000 COLONIES/mL ESCHERICHIA COLI  Culture, blood (routine x 2)     Status: None   Collection Time: 12/10/18  9:50 AM   Specimen: BLOOD LEFT HAND  Result Value Ref Range Status   Specimen Description BLOOD LEFT HAND  Final   Special Requests   Final    BOTTLES DRAWN AEROBIC AND ANAEROBIC Blood Culture adequate volume   Culture   Final    NO GROWTH 5 DAYS Performed at [facility], [address], [place], [place] [address]    Report Status 12/15/2018 FINAL  Final  Culture, blood (routine x 2)     Status: Abnormal   Collection Time: 12/10/18  9:51 AM   Specimen: BLOOD RIGHT HAND  Result Value Ref Range Status   Specimen Description   Final  BLOOD RIGHT HAND Performed at [facility], [address], [place], [place] [address]    Special Requests   Final    BOTTLES DRAWN AEROBIC AND ANAEROBIC Blood Culture results may not be optimal due to an inadequate volume of blood received in culture bottles Performed at [facility], [address], [place], [place] [address]    Culture  Setup Time   Final    IN BOTH AEROBIC AND ANAEROBIC BOTTLES GRAM NEGATIVE RODS Gram Stain Report Called to,Read Back By and Verified With: J HEARN,RN @[id number]  12/10/18 MKELLY CRITICAL RESULT CALLED TO, READ BACK BY AND VERIFIED WITH: Quentin Angst RN 12/11/18 0981 JDW Performed at [facility], [place] [address], [place], [place] [address]    Culture ESCHERICHIA COLI (A)  Final   Report Status 12/13/2018 FINAL  Final   Organism ID, Bacteria ESCHERICHIA COLI  Final      Susceptibility   Escherichia coli - MIC*    AMPICILLIN >=32 RESISTANT Resistant     CEFAZOLIN 16 SENSITIVE Sensitive     CEFEPIME <=1 SENSITIVE Sensitive     CEFTAZIDIME <=1 SENSITIVE Sensitive     CEFTRIAXONE <=1 SENSITIVE Sensitive     CIPROFLOXACIN <=0.25 SENSITIVE Sensitive     GENTAMICIN <=1 SENSITIVE Sensitive     IMIPENEM <=0.25 SENSITIVE Sensitive     TRIMETH/SULFA <=20 SENSITIVE Sensitive     AMPICILLIN/SULBACTAM >=32 RESISTANT Resistant     PIP/TAZO <=4 SENSITIVE Sensitive     Extended ESBL NEGATIVE Sensitive     * ESCHERICHIA COLI  Blood Culture ID Panel (Reflexed)     Status: Abnormal   Collection Time: 12/10/18  9:51 AM  Result Value Ref Range Status   Enterococcus species NOT DETECTED NOT DETECTED Final   Listeria monocytogenes NOT DETECTED NOT DETECTED Final   Staphylococcus species NOT  DETECTED NOT DETECTED Final   Staphylococcus aureus (BCID) NOT DETECTED NOT DETECTED Final   Streptococcus species NOT DETECTED NOT DETECTED Final   Streptococcus agalactiae NOT DETECTED NOT DETECTED Final   Streptococcus pneumoniae NOT DETECTED NOT DETECTED Final   Streptococcus pyogenes NOT DETECTED NOT DETECTED Final   Acinetobacter baumannii NOT DETECTED NOT DETECTED Final   Enterobacteriaceae species DETECTED (A) NOT DETECTED Final    Comment: Enterobacteriaceae represent a large family of gram-negative bacteria, not a single organism. CRITICAL RESULT CALLED TO, READ BACK BY AND VERIFIED WITH: H TETREAULT RN 12/11/18 0412 JDW    Enterobacter cloacae complex NOT DETECTED NOT DETECTED Final   Escherichia coli DETECTED (A) NOT DETECTED Final    Comment: CRITICAL RESULT CALLED TO, READ BACK BY AND VERIFIED WITH: H TETREAULT RN 12/11/18 0412 JDW    Klebsiella oxytoca NOT DETECTED NOT DETECTED Final   Klebsiella pneumoniae NOT DETECTED NOT DETECTED Final   Proteus species NOT DETECTED NOT DETECTED Final   Serratia marcescens NOT DETECTED NOT DETECTED Final   Carbapenem resistance NOT DETECTED NOT DETECTED Final   Haemophilus influenzae NOT DETECTED NOT DETECTED Final   Neisseria meningitidis NOT DETECTED NOT DETECTED Final   Pseudomonas aeruginosa NOT DETECTED NOT DETECTED Final   Candida albicans NOT DETECTED NOT DETECTED Final   Candida glabrata NOT DETECTED NOT DETECTED Final   Candida krusei NOT DETECTED NOT DETECTED Final   Candida parapsilosis NOT DETECTED NOT DETECTED Final   Candida tropicalis NOT DETECTED NOT DETECTED Final    Comment: Performed at [facility], [place] [address], [place], [place] [address]  SARS Coronavirus 2 by RT PCR (hospital order, performed  in [place] lab) Nasopharyngeal Nasopharyngeal Swab     Status: None   Collection Time: 12/10/18 10:03 AM   Specimen: Nasopharyngeal Swab  Result Value Ref Range Status   SARS Coronavirus 2 NEGATIVE  NEGATIVE Final    Comment: (NOTE) If result is NEGATIVE SARS-CoV-2 target nucleic acids are NOT DETECTED. The SARS-CoV-2 RNA is generally detectable in upper and lower  respiratory specimens during the acute phase of infection. The lowest  concentration of SARS-CoV-2 viral copies this assay can detect is 250  copies / mL. A negative result does not preclude SARS-CoV-2 infection  and should not be used as the sole basis for treatment or other  patient management decisions.  A negative result may occur with  improper specimen collection / handling, submission of specimen other  than nasopharyngeal swab, presence of viral mutation(s) within the  areas targeted by this assay, and inadequate number of viral copies  (<250 copies / mL). A negative result must be combined with clinical  observations, patient history, and epidemiological information. If result is POSITIVE SARS-CoV-2 target nucleic acids are DETECTED. The SARS-CoV-2 RNA is generally detectable in upper and lower  respiratory specimens dur ing the acute phase of infection.  Positive  results are indicative of active infection with SARS-CoV-2.  Clinical  correlation with patient history and other diagnostic information is  necessary to determine patient infection status.  Positive results do  not rule out bacterial infection or co-infection with other viruses. If result is PRESUMPTIVE POSTIVE SARS-CoV-2 nucleic acids MAY BE PRESENT.   A presumptive positive result was obtained on the submitted specimen  and confirmed on repeat testing.  While 2019 novel coronavirus  (SARS-CoV-2) nucleic acids may be present in the submitted sample  additional confirmatory testing may be necessary for epidemiological  and / or clinical management purposes  to differentiate between  SARS-CoV-2 and other Sarbecovirus currently known to infect humans.  If clinically indicated additional testing with an alternate test  methodology [phone]) is  advised. The SARS-CoV-2 RNA is generally  detectable in upper and lower respiratory sp ecimens during the acute  phase of infection. The expected result is Negative. Fact Sheet for Patients:  StrictlyIdeas.no Fact Sheet for Healthcare Providers: BankingDealers.co.za This test is not yet approved or cleared by the [place] FDA and has been authorized for detection and/or diagnosis of SARS-CoV-2 by FDA under an Emergency Use Authorization (EUA).  This EUA will remain in effect (meaning this test can be used) for the duration of the COVID-19 declaration under Section 564(b)(1) of the Act, 21 U.S.C. section 360bbb-3(b)(1), unless the authorization is terminated or revoked sooner. Performed at [facility], [address], [place], [place] [address]   Surgical pcr screen     Status: Abnormal   Collection Time: 12/10/18 11:22 AM   Specimen: Nasal Mucosa; Nasal Swab  Result Value Ref Range Status   MRSA, PCR NEGATIVE NEGATIVE Final   Staphylococcus aureus POSITIVE (A) NEGATIVE Final    Comment: RESULT CALLED TO, READ BACK BY AND VERIFIED WITH: SMITH,J ON 12/10/18 AT 1750 BY LOY,C (NOTE) The Xpert SA Assay (FDA approved for NASAL specimens in patients 82 years of age and older), is one component of a comprehensive surveillance program. It is not intended to diagnose infection nor to guide or monitor treatment. Performed at [facility] [address], [place], [place] [address]      Labs: BNP (last 3 results) No results for input(s): BNP in the last 8760 hours. Basic  Metabolic Panel: Recent Labs  Lab 12/11/18 0419 12/12/18 0413 12/13/18 0415 12/14/18 0507 12/15/18 0438  NA 138 137 141 141 142  K 3.6 3.4* 3.6 3.5 3.5  CL 110 107 105 104 104  CO2 17* 23 26 27 29   GLUCOSE 148* 129* 112* 90 91  BUN 32* 20 16 16 14   CREATININE 2.41* 1.53* 1.27* 1.20* 1.19*  CALCIUM 7.3* 7.9* 8.3* 8.4* 8.4*  MG 1.6* 1.6* 1.4* 1.6*  --    Liver  Function Tests: Recent Labs  Lab 12/10/18 0619 12/11/18 0419  AST 12* 15  ALT 13 12  ALKPHOS 28* 28*  BILITOT 0.6 1.1  PROT 5.5* 5.4*  ALBUMIN 2.9* 2.8*   Recent Labs  Lab 12/10/18 0619  LIPASE 14   No results for input(s): AMMONIA in the last 168 hours. CBC: Recent Labs  Lab 12/10/18 0619  12/11/18 0419 12/12/18 0413 12/13/18 0415 12/14/18 0507 12/14/18 0829 12/15/18 0438  WBC 4.0  --  4.3 4.3 4.8 4.3  --  4.1  NEUTROABS 3.2  --   --   --   --   --   --   --   HGB 8.7*   < > 9.5* 8.5* 8.2* 7.2* 7.6* 7.2*  HCT 26.2*   < > 28.4* 25.9* 25.0* 22.5* 23.8* 22.6*  MCV 95.6  --  93.4 93.5 93.3 95.3  --  96.2  PLT 72*  --  79* 75* 117* 132*  --  158   < > = values in this interval not displayed.   Cardiac Enzymes: No results for input(s): CKTOTAL, CKMB, CKMBINDEX, TROPONINI in the last 168 hours. BNP: Invalid input(s): POCBNP CBG: No results for input(s): GLUCAP in the last 168 hours. D-Dimer No results for input(s): DDIMER in the last 72 hours. Hgb A1c No results for input(s): HGBA1C in the last 72 hours. Lipid Profile No results for input(s): CHOL, HDL, LDLCALC, TRIG, CHOLHDL, LDLDIRECT in the last 72 hours. Thyroid function studies No results for input(s): TSH, T4TOTAL, T3FREE, THYROIDAB in the last 72 hours.  Invalid input(s): FREET3 Anemia work up No results for input(s): VITAMINB12, FOLATE, FERRITIN, TIBC, IRON, RETICCTPCT in the last 72 hours. Urinalysis    Component Value Date/Time   COLORURINE AMBER (A) 12/10/2018 0832   APPEARANCEUR TURBID (A) 12/10/2018 0832   LABSPEC 1.017 12/10/2018 0832   PHURINE 5.0 12/10/2018 0832   GLUCOSEU NEGATIVE 12/10/2018 0832   GLUCOSEU NEGATIVE 05/28/2016 0828   HGBUR MODERATE (A) 12/10/2018 0832   BILIRUBINUR NEGATIVE 12/10/2018 0832   KETONESUR NEGATIVE 12/10/2018 0832   PROTEINUR 100 (A) 12/10/2018 0832   UROBILINOGEN 0.2 05/28/2016 0828   NITRITE NEGATIVE 12/10/2018 0832   LEUKOCYTESUR LARGE (A) 12/10/2018 0832    Sepsis Labs Invalid input(s): PROCALCITONIN,  WBC,  LACTICIDVEN Microbiology Recent Results (from the past 240 hour(s))  Urine culture     Status: Abnormal   Collection Time: 12/10/18  8:32 AM   Specimen: Urine, Clean Catch  Result Value Ref Range Status   Specimen Description   Final    URINE, CLEAN CATCH Performed at [facility], [address], [place], [place] [address]    Special Requests   Final    NONE Performed at [facility], [address] [place], [place] [address]    Culture >=100,000 COLONIES/mL ESCHERICHIA COLI (A)  Final   Report Status 12/12/2018 FINAL  Final   Organism ID, Bacteria ESCHERICHIA COLI (A)  Final      Susceptibility   Escherichia coli -  MIC*    AMPICILLIN >=32 RESISTANT Resistant     CEFAZOLIN 16 SENSITIVE Sensitive     CEFTRIAXONE <=1 SENSITIVE Sensitive     CIPROFLOXACIN <=0.25 SENSITIVE Sensitive     GENTAMICIN <=1 SENSITIVE Sensitive     IMIPENEM <=0.25 SENSITIVE Sensitive     NITROFURANTOIN <=16 SENSITIVE Sensitive     TRIMETH/SULFA <=20 SENSITIVE Sensitive     AMPICILLIN/SULBACTAM >=32 RESISTANT Resistant     PIP/TAZO <=4 SENSITIVE Sensitive     Extended ESBL NEGATIVE Sensitive     * >=100,000 COLONIES/mL ESCHERICHIA COLI  Culture, blood (routine x 2)     Status: None   Collection Time: 12/10/18  9:50 AM   Specimen: BLOOD LEFT HAND  Result Value Ref Range Status   Specimen Description BLOOD LEFT HAND  Final   Special Requests   Final    BOTTLES DRAWN AEROBIC AND ANAEROBIC Blood Culture adequate volume   Culture   Final    NO GROWTH 5 DAYS Performed at [facility], [address], [place], [place] [address]    Report Status 12/15/2018 FINAL  Final  Culture, blood (routine x 2)     Status: Abnormal   Collection Time: 12/10/18  9:51 AM   Specimen: BLOOD RIGHT HAND  Result Value Ref Range Status   Specimen Description   Final    BLOOD RIGHT HAND Performed at [facility], [address], [place], [place] [address]    Special  Requests   Final    BOTTLES DRAWN AEROBIC AND ANAEROBIC Blood Culture results may not be optimal due to an inadequate volume of blood received in culture bottles Performed at [facility], [address], [place], [place] [address]    Culture  Setup Time   Final    IN BOTH AEROBIC AND ANAEROBIC BOTTLES GRAM NEGATIVE RODS Gram Stain Report Called to,Read Back By and Verified With: J HEARN,RN @[id number]  12/10/18 MKELLY CRITICAL RESULT CALLED TO, READ BACK BY AND VERIFIED WITHQuentin Angst RN 12/11/18 6314 JDW Performed at [facility], [place] [address], [place], [place] [address]    Culture ESCHERICHIA COLI (A)  Final   Report Status 12/13/2018 FINAL  Final   Organism ID, Bacteria ESCHERICHIA COLI  Final      Susceptibility   Escherichia coli - MIC*    AMPICILLIN >=32 RESISTANT Resistant     CEFAZOLIN 16 SENSITIVE Sensitive     CEFEPIME <=1 SENSITIVE Sensitive     CEFTAZIDIME <=1 SENSITIVE Sensitive     CEFTRIAXONE <=1 SENSITIVE Sensitive     CIPROFLOXACIN <=0.25 SENSITIVE Sensitive     GENTAMICIN <=1 SENSITIVE Sensitive     IMIPENEM <=0.25 SENSITIVE Sensitive     TRIMETH/SULFA <=20 SENSITIVE Sensitive     AMPICILLIN/SULBACTAM >=32 RESISTANT Resistant     PIP/TAZO <=4 SENSITIVE Sensitive     Extended ESBL NEGATIVE Sensitive     * ESCHERICHIA COLI  Blood Culture ID Panel (Reflexed)     Status: Abnormal   Collection Time: 12/10/18  9:51 AM  Result Value Ref Range Status   Enterococcus species NOT DETECTED NOT DETECTED Final   Listeria monocytogenes NOT DETECTED NOT DETECTED Final   Staphylococcus species NOT DETECTED NOT DETECTED Final   Staphylococcus aureus (BCID) NOT DETECTED NOT DETECTED Final   Streptococcus species NOT DETECTED NOT DETECTED Final   Streptococcus agalactiae NOT DETECTED NOT DETECTED Final   Streptococcus pneumoniae NOT DETECTED NOT DETECTED Final   Streptococcus pyogenes NOT DETECTED NOT DETECTED Final   Acinetobacter  baumannii NOT DETECTED NOT DETECTED Final    Enterobacteriaceae species DETECTED (A) NOT DETECTED Final    Comment: Enterobacteriaceae represent a large family of gram-negative bacteria, not a single organism. CRITICAL RESULT CALLED TO, READ BACK BY AND VERIFIED WITH: H TETREAULT RN 12/11/18 0412 JDW    Enterobacter cloacae complex NOT DETECTED NOT DETECTED Final   Escherichia coli DETECTED (A) NOT DETECTED Final    Comment: CRITICAL RESULT CALLED TO, READ BACK BY AND VERIFIED WITH: H TETREAULT RN 12/11/18 0412 JDW    Klebsiella oxytoca NOT DETECTED NOT DETECTED Final   Klebsiella pneumoniae NOT DETECTED NOT DETECTED Final   Proteus species NOT DETECTED NOT DETECTED Final   Serratia marcescens NOT DETECTED NOT DETECTED Final   Carbapenem resistance NOT DETECTED NOT DETECTED Final   Haemophilus influenzae NOT DETECTED NOT DETECTED Final   Neisseria meningitidis NOT DETECTED NOT DETECTED Final   Pseudomonas aeruginosa NOT DETECTED NOT DETECTED Final   Candida albicans NOT DETECTED NOT DETECTED Final   Candida glabrata NOT DETECTED NOT DETECTED Final   Candida krusei NOT DETECTED NOT DETECTED Final   Candida parapsilosis NOT DETECTED NOT DETECTED Final   Candida tropicalis NOT DETECTED NOT DETECTED Final    Comment: Performed at [facility], [place] [address] [place], [place] [address]  SARS Coronavirus 2 by RT PCR (hospital order, performed in [facility] hospital lab) Nasopharyngeal Nasopharyngeal Swab     Status: None   Collection Time: 12/10/18 10:03 AM   Specimen: Nasopharyngeal Swab  Result Value Ref Range Status   SARS Coronavirus 2 NEGATIVE NEGATIVE Final    Comment: (NOTE) If result is NEGATIVE SARS-CoV-2 target nucleic acids are NOT DETECTED. The SARS-CoV-2 RNA is generally detectable in upper and lower  respiratory specimens during the acute phase of infection. The lowest  concentration of SARS-CoV-2 viral copies this assay can detect is 250  copies / mL. A negative result does not preclude SARS-CoV-2  infection  and should not be used as the sole basis for treatment or other  patient management decisions.  A negative result may occur with  improper specimen collection / handling, submission of specimen other  than nasopharyngeal swab, presence of viral mutation(s) within the  areas targeted by this assay, and inadequate number of viral copies  (<250 copies / mL). A negative result must be combined with clinical  observations, patient history, and epidemiological information. If result is POSITIVE SARS-CoV-2 target nucleic acids are DETECTED. The SARS-CoV-2 RNA is generally detectable in upper and lower  respiratory specimens dur ing the acute phase of infection.  Positive  results are indicative of active infection with SARS-CoV-2.  Clinical  correlation with patient history and other diagnostic information is  necessary to determine patient infection status.  Positive results do  not rule out bacterial infection or co-infection with other viruses. If result is PRESUMPTIVE POSTIVE SARS-CoV-2 nucleic acids MAY BE PRESENT.   A presumptive positive result was obtained on the submitted specimen  and confirmed on repeat testing.  While 2019 novel coronavirus  (SARS-CoV-2) nucleic acids may be present in the submitted sample  additional confirmatory testing may be necessary for epidemiological  and / or clinical management purposes  to differentiate between  SARS-CoV-2 and other Sarbecovirus currently known to infect humans.  If clinically indicated additional testing with an alternate test  methodology [phone]) is advised. The SARS-CoV-2 RNA is generally  detectable in upper and lower respiratory sp ecimens during the acute  phase of infection. The expected  result is Negative. Fact Sheet for Patients:  StrictlyIdeas.no Fact Sheet for Healthcare Providers: BankingDealers.co.za This test is not yet approved or cleared by the [place]  FDA and has been authorized for detection and/or diagnosis of SARS-CoV-2 by FDA under an Emergency Use Authorization (EUA).  This EUA will remain in effect (meaning this test can be used) for the duration of the COVID-19 declaration under Section 564(b)(1) of the Act, 21 U.S.C. section 360bbb-3(b)(1), unless the authorization is terminated or revoked sooner. Performed at [facility], [address] [place], [place] [address]   Surgical pcr screen     Status: Abnormal   Collection Time: 12/10/18 11:22 AM   Specimen: Nasal Mucosa; Nasal Swab  Result Value Ref Range Status   MRSA, PCR NEGATIVE NEGATIVE Final   Staphylococcus aureus POSITIVE (A) NEGATIVE Final    Comment: RESULT CALLED TO, READ BACK BY AND VERIFIED WITH: SMITH,J ON 12/10/18 AT 1750 BY LOY,C (NOTE) The Xpert SA Assay (FDA approved for NASAL specimens in patients 19 years of age and older), is one component of a comprehensive surveillance program. It is not intended to diagnose infection nor to guide or monitor treatment. Performed at [facility], [address], [place], [place] [address]      Time coordinating discharge: 40 minutes  SIGNED:   Rodena Goldmann, DO [facility] 12/15/2018, 10:30 AM  If 7PM-7AM, please contact night-coverage [email or web address]

## 2018-12-15 NOTE — TOC Transition Note (Signed)
Transition of Care [facility]) - CM/SW Discharge Note   Patient Details  Name: [name] [name] MRN: [id number] Date of Birth: 26-Jan-1971  Transition of [facility]) CM/SW Contact:  Shade Flood, LCSW Phone Number: 12/15/2018, 12:54 PM   Clinical Narrative:     Pt requiring O2 for dc. In network provider for pt's [facility] insurance is [place]. Referral sent to [facility] in [place]. Awaiting portable tank delivery from the [place]. Once it arrives, pt will dc home. [employer] will provide in home setup to pt once she is home. She can call them on her way home and they will coordinate the timing.  Updated pt's RN. No other TOC needs identified for dc.   Final next level of care: Home/Self Care Barriers to Discharge: Barriers Resolved   Patient Goals and CMS Choice        Discharge Placement                       Discharge Plan and Services                DME Arranged: Oxygen DME Agency: [employer] Date DME Agency Contacted: 12/15/18 Time DME Agency Contacted: [id number]              Social Determinants of Health (SDOH) Interventions     Readmission Risk Interventions Readmission Risk Prevention Plan 12/15/2018  Medication Screening Complete  Transportation Screening Complete  Some recent data might be hidden

## 2018-12-15 NOTE — Progress Notes (Signed)
SATURATION QUALIFICATIONS: (This note is used to comply with regulatory documentation for home oxygen)  Patient Saturations on Room Air at Rest = 94%  Patient Saturations on Room Air while Ambulating = 87%  Patient Saturations on 2 Liters of oxygen while Ambulating = 94%  Please briefly explain why patient needs home oxygen: unable to maintain saturation above 90 without oxygen.

## 2018-12-15 NOTE — Progress Notes (Signed)
Subjective: She says she feels better and wants to go home.  She was able to ambulate some yesterday.  Blood pressure looks better at this morning.  She is still requiring oxygen.  She says she is coughing a little bit and not coughing anything up.  Objective: Vital signs in last 24 hours: Temp:  [97.8 F (36.6 C)-99 F (37.2 C)] 97.8 F (36.6 C) (10/26 0400) Pulse Rate:  [80-105] 84 (10/26 0600) Resp:  [16-36] 25 (10/26 0600) BP: (94-125)/(49-70) 112/66 (10/26 0600) SpO2:  [80 %-100 %] 98 % (10/26 0600) Weight:  [77.2 kg] 77.2 kg (10/26 0446) Weight change: 1.6 kg Last BM Date: 12/13/18  Intake/Output from previous day: 10/25 0701 - 10/26 0700 In: 1925.6 [P.O.:1440; I.V.:288.7; IV Piggyback:196.9] Out: -   PHYSICAL EXAM General appearance: alert, cooperative and no distress Resp: rhonchi bilaterally Cardio: regular rate and rhythm, S1, S2 normal, no murmur, click, rub or gallop GI: soft, non-tender; bowel sounds normal; no masses,  no organomegaly Extremities: extremities normal, atraumatic, no cyanosis or edema  Lab Results:  Results for orders placed or performed during the hospital encounter of 12/10/18 (from the past 48 hour(s))  CBC     Status: Abnormal   Collection Time: 12/14/18  5:07 AM  Result Value Ref Range   WBC 4.3 4.0 - 10.5 K/uL   RBC 2.36 (L) 3.87 - 5.11 MIL/uL   Hemoglobin 7.2 (L) 12.0 - 15.0 g/dL   HCT 22.5 (L) 36.0 - 46.0 %   MCV 95.3 80.0 - 100.0 fL   MCH 30.5 26.0 - 34.0 pg   MCHC 32.0 30.0 - 36.0 g/dL   RDW 13.3 11.5 - 15.5 %   Platelets 132 (L) 150 - 400 K/uL   nRBC 0.0 0.0 - 0.2 %    Comment: Performed at [facility], [address], [place], [place] [address]  Basic metabolic panel     Status: Abnormal   Collection Time: 12/14/18  5:07 AM  Result Value Ref Range   Sodium 141 135 - 145 mmol/L   Potassium 3.5 3.5 - 5.1 mmol/L   Chloride 104 98 - 111 mmol/L   CO2 27 22 - 32 mmol/L   Glucose, Bld 90 70 - 99 mg/dL   BUN 16 6 - 20 mg/dL   Creatinine, Ser 1.20 (H) 0.44 - 1.00 mg/dL   Calcium 8.4 (L) 8.9 - 10.3 mg/dL   GFR calc non Af Amer 53 (L) >60 mL/min   GFR calc Af Amer >60 >60 mL/min   Anion gap 10 5 - 15    Comment: Performed at [facility], [address], [place], [place] [address]  Magnesium     Status: Abnormal   Collection Time: 12/14/18  5:07 AM  Result Value Ref Range   Magnesium 1.6 (L) 1.7 - 2.4 mg/dL    Comment: Performed at [facility], [address] [place], [place] [address]  Hemoglobin and hematocrit, blood     Status: Abnormal   Collection Time: 12/14/18  8:29 AM  Result Value Ref Range   Hemoglobin 7.6 (L) 12.0 - 15.0 g/dL   HCT 23.8 (L) 36.0 - 46.0 %    Comment: Performed at [facility], [address], [place], [place] [address]  CBC     Status: Abnormal   Collection Time: 12/15/18  4:38 AM  Result Value Ref Range   WBC 4.1 4.0 - 10.5 K/uL   RBC 2.35 (L) 3.87 - 5.11 MIL/uL   Hemoglobin 7.2 (L) 12.0 -  15.0 g/dL   HCT 22.6 (L) 36.0 - 46.0 %   MCV 96.2 80.0 - 100.0 fL   MCH 30.6 26.0 - 34.0 pg   MCHC 31.9 30.0 - 36.0 g/dL   RDW 13.3 11.5 - 15.5 %   Platelets 158 150 - 400 K/uL   nRBC 0.0 0.0 - 0.2 %    Comment: Performed at [facility], [address] [place], [place] [address]  Basic metabolic panel     Status: Abnormal   Collection Time: 12/15/18  4:38 AM  Result Value Ref Range   Sodium 142 135 - 145 mmol/L   Potassium 3.5 3.5 - 5.1 mmol/L   Chloride 104 98 - 111 mmol/L   CO2 29 22 - 32 mmol/L   Glucose, Bld 91 70 - 99 mg/dL   BUN 14 6 - 20 mg/dL   Creatinine, Ser 1.19 (H) 0.44 - 1.00 mg/dL   Calcium 8.4 (L) 8.9 - 10.3 mg/dL   GFR calc non Af Amer 54 (L) >60 mL/min   GFR calc Af Amer >60 >60 mL/min   Anion gap 9 5 - 15    Comment: Performed at [facility], [address], [place], [place] [address]    ABGS No results for input(s): PHART, PO2ART, TCO2, HCO3 in the last 72 hours.  Invalid input(s): PCO2 CULTURES Recent Results (from the past 240 hour(s))  Urine culture      Status: Abnormal   Collection Time: 12/10/18  8:32 AM   Specimen: Urine, Clean Catch  Result Value Ref Range Status   Specimen Description   Final    URINE, CLEAN CATCH Performed at [facility], [address] [place], [place] [address]    Special Requests   Final    NONE Performed at [facility], [address], [place], [place] [address]    Culture >=100,000 COLONIES/mL ESCHERICHIA COLI (A)  Final   Report Status 12/12/2018 FINAL  Final   Organism ID, Bacteria ESCHERICHIA COLI (A)  Final      Susceptibility   Escherichia coli - MIC*    AMPICILLIN >=32 RESISTANT Resistant     CEFAZOLIN 16 SENSITIVE Sensitive     CEFTRIAXONE <=1 SENSITIVE Sensitive     CIPROFLOXACIN <=0.25 SENSITIVE Sensitive     GENTAMICIN <=1 SENSITIVE Sensitive     IMIPENEM <=0.25 SENSITIVE Sensitive     NITROFURANTOIN <=16 SENSITIVE Sensitive     TRIMETH/SULFA <=20 SENSITIVE Sensitive     AMPICILLIN/SULBACTAM >=32 RESISTANT Resistant     PIP/TAZO <=4 SENSITIVE Sensitive     Extended ESBL NEGATIVE Sensitive     * >=100,000 COLONIES/mL ESCHERICHIA COLI  Culture, blood (routine x 2)     Status: None (Preliminary result)   Collection Time: 12/10/18  9:50 AM   Specimen: BLOOD LEFT HAND  Result Value Ref Range Status   Specimen Description BLOOD LEFT HAND  Final   Special Requests   Final    BOTTLES DRAWN AEROBIC AND ANAEROBIC Blood Culture adequate volume   Culture   Final    NO GROWTH 4 DAYS Performed at [facility], [address], [place], [place] [address]    Report Status PENDING  Incomplete  Culture, blood (routine x 2)     Status: Abnormal   Collection Time: 12/10/18  9:51 AM   Specimen: BLOOD RIGHT HAND  Result Value Ref Range Status   Specimen Description   Final    BLOOD RIGHT HAND Performed at [facility], [address] [place], [place]  [address]    Special Requests   Final    BOTTLES DRAWN AEROBIC AND ANAEROBIC Blood Culture results may not be optimal due to an inadequate volume of  blood received in culture bottles Performed at [facility], [address] [place], [place] [address]    Culture  Setup Time   Final    IN BOTH AEROBIC AND ANAEROBIC BOTTLES GRAM NEGATIVE RODS Gram Stain Report Called to,Read Back By and Verified With: J HEARN,RN @2248  12/10/18 MKELLY CRITICAL RESULT CALLED TO, READ BACK BY AND VERIFIED WITH: Quentin Angst RN 12/11/18 2979 JDW Performed at [facility], [place] [address] [place], [place] [address]    Culture ESCHERICHIA COLI (A)  Final   Report Status 12/13/2018 FINAL  Final   Organism ID, Bacteria ESCHERICHIA COLI  Final      Susceptibility   Escherichia coli - MIC*    AMPICILLIN >=32 RESISTANT Resistant     CEFAZOLIN 16 SENSITIVE Sensitive     CEFEPIME <=1 SENSITIVE Sensitive     CEFTAZIDIME <=1 SENSITIVE Sensitive     CEFTRIAXONE <=1 SENSITIVE Sensitive     CIPROFLOXACIN <=0.25 SENSITIVE Sensitive     GENTAMICIN <=1 SENSITIVE Sensitive     IMIPENEM <=0.25 SENSITIVE Sensitive     TRIMETH/SULFA <=20 SENSITIVE Sensitive     AMPICILLIN/SULBACTAM >=32 RESISTANT Resistant     PIP/TAZO <=4 SENSITIVE Sensitive     Extended ESBL NEGATIVE Sensitive     * ESCHERICHIA COLI  Blood Culture ID Panel (Reflexed)     Status: Abnormal   Collection Time: 12/10/18  9:51 AM  Result Value Ref Range Status   Enterococcus species NOT DETECTED NOT DETECTED Final   Listeria monocytogenes NOT DETECTED NOT DETECTED Final   Staphylococcus species NOT DETECTED NOT DETECTED Final   Staphylococcus aureus (BCID) NOT DETECTED NOT DETECTED Final   Streptococcus species NOT DETECTED NOT DETECTED Final   Streptococcus agalactiae NOT DETECTED NOT DETECTED Final   Streptococcus pneumoniae NOT DETECTED NOT DETECTED Final   Streptococcus pyogenes NOT DETECTED NOT DETECTED Final   Acinetobacter baumannii NOT DETECTED NOT DETECTED Final   Enterobacteriaceae species DETECTED (A) NOT DETECTED Final    Comment: Enterobacteriaceae represent a large family of  gram-negative bacteria, not a single organism. CRITICAL RESULT CALLED TO, READ BACK BY AND VERIFIED WITH: H TETREAULT RN 12/11/18 0412 JDW    Enterobacter cloacae complex NOT DETECTED NOT DETECTED Final   Escherichia coli DETECTED (A) NOT DETECTED Final    Comment: CRITICAL RESULT CALLED TO, READ BACK BY AND VERIFIED WITH: H TETREAULT RN 12/11/18 0412 JDW    Klebsiella oxytoca NOT DETECTED NOT DETECTED Final   Klebsiella pneumoniae NOT DETECTED NOT DETECTED Final   Proteus species NOT DETECTED NOT DETECTED Final   Serratia marcescens NOT DETECTED NOT DETECTED Final   Carbapenem resistance NOT DETECTED NOT DETECTED Final   Haemophilus influenzae NOT DETECTED NOT DETECTED Final   Neisseria meningitidis NOT DETECTED NOT DETECTED Final   Pseudomonas aeruginosa NOT DETECTED NOT DETECTED Final   Candida albicans NOT DETECTED NOT DETECTED Final   Candida glabrata NOT DETECTED NOT DETECTED Final   Candida krusei NOT DETECTED NOT DETECTED Final   Candida parapsilosis NOT DETECTED NOT DETECTED Final   Candida tropicalis NOT DETECTED NOT DETECTED Final    Comment: Performed at [facility], [place] [address], [place], [place] [address]  SARS Coronavirus 2 by RT PCR (hospital order, performed in [facility] hospital lab) Nasopharyngeal Nasopharyngeal Swab  Status: None   Collection Time: 12/10/18 10:03 AM   Specimen: Nasopharyngeal Swab  Result Value Ref Range Status   SARS Coronavirus 2 NEGATIVE NEGATIVE Final    Comment: (NOTE) If result is NEGATIVE SARS-CoV-2 target nucleic acids are NOT DETECTED. The SARS-CoV-2 RNA is generally detectable in upper and lower  respiratory specimens during the acute phase of infection. The lowest  concentration of SARS-CoV-2 viral copies this assay can detect is 250  copies / mL. A negative result does not preclude SARS-CoV-2 infection  and should not be used as the sole basis for treatment or other  patient management decisions.  A negative result may  occur with  improper specimen collection / handling, submission of specimen other  than nasopharyngeal swab, presence of viral mutation(s) within the  areas targeted by this assay, and inadequate number of viral copies  (<250 copies / mL). A negative result must be combined with clinical  observations, patient history, and epidemiological information. If result is POSITIVE SARS-CoV-2 target nucleic acids are DETECTED. The SARS-CoV-2 RNA is generally detectable in upper and lower  respiratory specimens dur ing the acute phase of infection.  Positive  results are indicative of active infection with SARS-CoV-2.  Clinical  correlation with patient history and other diagnostic information is  necessary to determine patient infection status.  Positive results do  not rule out bacterial infection or co-infection with other viruses. If result is PRESUMPTIVE POSTIVE SARS-CoV-2 nucleic acids MAY BE PRESENT.   A presumptive positive result was obtained on the submitted specimen  and confirmed on repeat testing.  While 2019 novel coronavirus  (SARS-CoV-2) nucleic acids may be present in the submitted sample  additional confirmatory testing may be necessary for epidemiological  and / or clinical management purposes  to differentiate between  SARS-CoV-2 and other Sarbecovirus currently known to infect humans.  If clinically indicated additional testing with an alternate test  methodology [phone]) is advised. The SARS-CoV-2 RNA is generally  detectable in upper and lower respiratory sp ecimens during the acute  phase of infection. The expected result is Negative. Fact Sheet for Patients:  StrictlyIdeas.no Fact Sheet for Healthcare Providers: BankingDealers.co.za This test is not yet approved or cleared by the [place] FDA and has been authorized for detection and/or diagnosis of SARS-CoV-2 by FDA under an Emergency Use Authorization (EUA).  This  EUA will remain in effect (meaning this test can be used) for the duration of the COVID-19 declaration under Section 564(b)(1) of the Act, 21 U.S.C. section 360bbb-3(b)(1), unless the authorization is terminated or revoked sooner. Performed at [facility], [address] [place], [place] [address]   Surgical pcr screen     Status: Abnormal   Collection Time: 12/10/18 11:22 AM   Specimen: Nasal Mucosa; Nasal Swab  Result Value Ref Range Status   MRSA, PCR NEGATIVE NEGATIVE Final   Staphylococcus aureus POSITIVE (A) NEGATIVE Final    Comment: RESULT CALLED TO, READ BACK BY AND VERIFIED WITH: SMITH,J ON 12/10/18 AT 1750 BY LOY,C (NOTE) The Xpert SA Assay (FDA approved for NASAL specimens in patients 49 years of age and older), is one component of a comprehensive surveillance program. It is not intended to diagnose infection nor to guide or monitor treatment. Performed at [facility], [address] [place], [place] [address]    Studies/Results: No results found.  Medications:  Prior to Admission:  Medications Prior to Admission  Medication Sig Dispense Refill Last Dose  . ALPRAZolam (XANAX) 1 MG tablet Take  1 mg by mouth 2 (two) times daily.   12/09/2018 at Unknown time  . benazepril-hydrochlorthiazide (LOTENSIN HCT) 20-12.5 MG tablet Take 1 tablet by mouth daily. 90 tablet 3 12/09/2018 at Unknown time  . Cholecalciferol (VITAMIN D3) 250 MCG (10000 UT) capsule Take 10,000 Units by mouth daily.   12/09/2018 at Unknown time  . cyanocobalamin (,VITAMIN B-12,) 1000 MCG/ML injection INJECT 1ML INTO THE SKIN EVERY 14 DAYS (Patient taking differently: Every 10 days) 6 mL 11 Past Month at Unknown time  . cyclobenzaprine (FLEXERIL) 5 MG tablet TAKE 1 TABLET (5 MG TOTAL) BY MOUTH AT BEDTIME AS NEEDED FOR MUSCLE SPASMS. 30 tablet 1 12/09/2018 at Unknown time  . diclofenac sodium (VOLTAREN) 1 % GEL Apply 1 application topically 4 (four) times daily. 100 g 3 12/09/2018 at Unknown time  .  HYDROcodone-acetaminophen (NORCO) 10-325 MG tablet Take 1 tablet by mouth every 8 (eight) hours as needed for severe pain. 90 tablet 0 12/09/2018 at Unknown time  . ketorolac (TORADOL) 10 MG tablet Take 1 tablet (10 mg total) by mouth every 6 (six) hours as needed. for pain 20 tablet 0   . lamoTRIgine (LAMICTAL) 200 MG tablet Take 1 tablet by mouth daily.   12/09/2018 at Unknown time  . norethindrone (AYGESTIN) 5 MG tablet Take 1 tablet (5 mg total) by mouth daily. 90 tablet 3 12/09/2018 at Unknown time  . pantoprazole (PROTONIX) 40 MG tablet 1 PO 30 MINUTES PRIOR TO YOUR FIRST MEAL QD 90 tablet 3 12/09/2018 at Unknown time  . potassium chloride (K-DUR) 10 MEQ tablet TAKE 1 TABLET BY MOUTH EVERY DAY 90 tablet 3 12/09/2018 at Unknown time  . sertraline (ZOLOFT) 100 MG tablet Take 200 mg by mouth daily.    12/09/2018 at Unknown time  . sodium chloride 0.9 % SOLN 250 mL with vedolizumab 300 MG SOLR 300 mg Inject 300 mg into the vein. LOADING DOSE THEN Q8 WEEKS     . SYRINGE-NEEDLE, DISP, 3 ML (B-D SYRINGE/NEEDLE 3CC/25GX5/8) 25G X 5/8" 3 ML MISC Use to administer B12 injection every 14 days 24 each 0   . varenicline (CHANTIX CONTINUING MONTH PAK) 1 MG tablet Take 1 tablet (1 mg total) by mouth 2 (two) times daily. 60 tablet 4 12/09/2018 at Unknown time  . vedolizumab (ENTYVIO) 300 MG injection Inject into the vein. Every 8 weeks     . ciprofloxacin (CIPRO) 500 MG tablet Take 1 tablet (500 mg total) by mouth 2 (two) times daily. (Patient not taking: Reported on 12/10/2018) 28 tablet 1 Completed Course at Unknown time  . fluconazole (DIFLUCAN) 150 MG tablet Take 1 tablet (150 mg total) by mouth every three (3) days as needed. (Patient not taking: Reported on 12/10/2018) 2 tablet 1 Not Taking at Unknown time  . ondansetron (ZOFRAN) 4 MG tablet Take 1 tablet (4 mg total) by mouth every 8 (eight) hours as needed for nausea or vomiting. (Patient not taking: Reported on 12/10/2018) 20 tablet 0 Not Taking at  Unknown time  . promethazine (PHENERGAN) 25 MG tablet Take 1 tablet (25 mg total) by mouth 2 (two) times daily as needed for nausea. (Patient not taking: Reported on 12/10/2018) 60 tablet 0 Not Taking at Unknown time  . varenicline (CHANTIX STARTING MONTH PAK) 0.5 MG X 11 & 1 MG X 42 tablet Take one 0.5 mg tablet by mouth once daily for 3 days, then increase to one 0.5 mg tablet twice daily for 4 days, then increase to one 1 mg tablet twice  daily. (Patient not taking: Reported on 12/10/2018) 53 tablet 0 Completed Course at Unknown time   Scheduled: . ALPRAZolam  0.5 mg Oral BID  . chlorhexidine  15 mL Mouth Rinse BID  . Chlorhexidine Gluconate Cloth  6 each Topical Daily  . ferrous sulfate  325 mg Oral Q breakfast  . fluticasone  1 spray Each Nare Daily  . folic acid  1 mg Oral Daily  . lamoTRIgine  200 mg Oral QHS  . mouth rinse  15 mL Mouth Rinse q12n4p  . mupirocin ointment  1 application Nasal BID  . pantoprazole  40 mg Oral Daily  . sertraline  200 mg Oral QHS  . sodium chloride flush  10-40 mL Intracatheter Q12H  . varenicline  1 mg Oral BID   Continuous: . sodium chloride Stopped (12/14/18 0516)  . sodium chloride Stopped (12/15/18 0236)  . norepinephrine (LEVOPHED) Adult infusion Stopped (12/13/18 1500)  . piperacillin-tazobactam (ZOSYN)  IV 12.5 mL/hr at 12/15/18 5498   YME:BRAXE/NMMHWKGS arterial line **AND** sodium chloride, acetaminophen **OR** acetaminophen, benzonatate, HYDROmorphone (DILAUDID) injection, ondansetron **OR** ondansetron (ZOFRAN) IV, sodium chloride flush  Assesment: She was admitted with septic shock.  This is related to E. coli UTI and bacteremia.  She required pressors but is now off.  Blood pressure looks better this morning.  She had pneumonia from aspiration and she seems to be doing better as far as that is concerned  She has acute hypoxic respiratory failure and she is still on oxygen  She had acute kidney injury that has improved  She was very  dehydrated on admission and has responded to fluid resuscitation  At baseline she has Crohn's disease and has been on a biologic for that.  She has bipolar disease on medications  She has some element of COPD  She has had thrombocytopenia presumably related to splenomegaly  She has anemia and her hemoglobin level 7.2 again this morning.  Initial occult blood from stool is negative and second is pending Active Problems:   Sepsis ([place])    Plan: Since she is approaching discharge I will plan to follow more peripherally.  She asked about follow-up and she should follow-up with her primary care doctor and I will be happy to see her if he feels that she needs pulmonary care.    LOS: 5 days   Alonza Bogus 12/15/2018, 7:28 AM

## 2018-12-15 NOTE — Progress Notes (Deleted)
Pt ambulated around unit X2 on RA with O2 sat ranging from 87-94%. Pt denied SOB or dizziness but did express feelings of general weakness. Pt on 2L [place] at resting with O2 sat at 93%.

## 2018-12-16 ENCOUNTER — Telehealth: Payer: Self-pay | Admitting: *Deleted

## 2018-12-16 NOTE — Telephone Encounter (Signed)
Pt was on TCM report tried calling to make hosp follow=up there was no answer LMOM RTC.Marland KitchenJohny Chess

## 2018-12-17 NOTE — Telephone Encounter (Signed)
Called pt again still no answer LMOM RTC need to make hosp f/u appt.Marland KitchenJohny [name]

## 2018-12-18 NOTE — Telephone Encounter (Signed)
Pt return call bck this afternoon. Verified Virtual appt has been made for 11/3 w/Dr. Alain Marion and completed TCM call below.[name] [name]  Transition Care Management Follow-up Telephone Call   Date discharged? 12/15/18   How have you been since you were released from the hospital? Pt states she is doing ok   Do you understand why you were in the hospital? YES   Do you understand the discharge instructions? YES   Where were you discharged to? Home   Items Reviewed:  Medications reviewed: YES, pt states she was told to hold her Benazepril for her BP until she see MD  Allergies reviewed: YES  Dietary changes reviewed: YES, heart healthy  Referrals reviewed: No referral recommended   Functional Questionnaire:   Activities of Daily Living (ADLs):   She states she are independent in the following: ambulation, bathing and hygiene, feeding, continence, grooming, toileting and dressing States she doesn't require assistance    Any transportation issues/concerns?: NO    Any patient concerns? NO   Confirmed importance and date/time of follow-up visits scheduled YES, (virtual) 12/23/18  Provider Appointment booked with Dr. Alain Marion  Confirmed with patient if condition begins to worsen call PCP or go to the ER.  Patient was given the office number and encouraged to call back with question or concerns.  : YES

## 2018-12-18 NOTE — Telephone Encounter (Signed)
Tried reaching out to pt on several occassions not been successful. Pt has not return call back nor made appt. Left msg again to schedule hosp f/u asap. Closing counter.Marland KitchenJohny Chess

## 2018-12-19 DIAGNOSIS — J90 Pleural effusion, not elsewhere classified: Secondary | ICD-10-CM | POA: Diagnosis not present

## 2018-12-19 DIAGNOSIS — A419 Sepsis, unspecified organism: Secondary | ICD-10-CM | POA: Diagnosis not present

## 2018-12-19 DIAGNOSIS — R0602 Shortness of breath: Secondary | ICD-10-CM | POA: Diagnosis not present

## 2018-12-19 DIAGNOSIS — R404 Transient alteration of awareness: Secondary | ICD-10-CM | POA: Diagnosis not present

## 2018-12-19 DIAGNOSIS — J9611 Chronic respiratory failure with hypoxia: Secondary | ICD-10-CM | POA: Diagnosis not present

## 2018-12-19 DIAGNOSIS — J188 Other pneumonia, unspecified organism: Secondary | ICD-10-CM | POA: Diagnosis not present

## 2018-12-19 DIAGNOSIS — J9612 Chronic respiratory failure with hypercapnia: Secondary | ICD-10-CM | POA: Diagnosis not present

## 2018-12-19 DIAGNOSIS — J9621 Acute and chronic respiratory failure with hypoxia: Secondary | ICD-10-CM | POA: Diagnosis not present

## 2018-12-19 DIAGNOSIS — R0902 Hypoxemia: Secondary | ICD-10-CM | POA: Diagnosis not present

## 2018-12-19 DIAGNOSIS — J189 Pneumonia, unspecified organism: Secondary | ICD-10-CM | POA: Diagnosis not present

## 2018-12-19 DIAGNOSIS — N179 Acute kidney failure, unspecified: Secondary | ICD-10-CM | POA: Diagnosis not present

## 2018-12-19 DIAGNOSIS — R069 Unspecified abnormalities of breathing: Secondary | ICD-10-CM | POA: Diagnosis not present

## 2018-12-20 DIAGNOSIS — J9602 Acute respiratory failure with hypercapnia: Secondary | ICD-10-CM | POA: Diagnosis not present

## 2018-12-20 DIAGNOSIS — F17210 Nicotine dependence, cigarettes, uncomplicated: Secondary | ICD-10-CM | POA: Diagnosis not present

## 2018-12-20 DIAGNOSIS — K5090 Crohn's disease, unspecified, without complications: Secondary | ICD-10-CM | POA: Diagnosis not present

## 2018-12-20 DIAGNOSIS — J441 Chronic obstructive pulmonary disease with (acute) exacerbation: Secondary | ICD-10-CM | POA: Diagnosis not present

## 2018-12-20 DIAGNOSIS — R6520 Severe sepsis without septic shock: Secondary | ICD-10-CM | POA: Diagnosis not present

## 2018-12-20 DIAGNOSIS — F419 Anxiety disorder, unspecified: Secondary | ICD-10-CM | POA: Diagnosis not present

## 2018-12-20 DIAGNOSIS — A419 Sepsis, unspecified organism: Secondary | ICD-10-CM | POA: Diagnosis not present

## 2018-12-20 DIAGNOSIS — J690 Pneumonitis due to inhalation of food and vomit: Secondary | ICD-10-CM | POA: Diagnosis not present

## 2018-12-20 DIAGNOSIS — Z1624 Resistance to multiple antibiotics: Secondary | ICD-10-CM | POA: Diagnosis not present

## 2018-12-20 DIAGNOSIS — R739 Hyperglycemia, unspecified: Secondary | ICD-10-CM | POA: Diagnosis not present

## 2018-12-20 DIAGNOSIS — J9601 Acute respiratory failure with hypoxia: Secondary | ICD-10-CM | POA: Diagnosis not present

## 2018-12-20 DIAGNOSIS — N179 Acute kidney failure, unspecified: Secondary | ICD-10-CM | POA: Diagnosis not present

## 2018-12-21 DIAGNOSIS — J9690 Respiratory failure, unspecified, unspecified whether with hypoxia or hypercapnia: Secondary | ICD-10-CM | POA: Diagnosis not present

## 2018-12-22 DIAGNOSIS — J9690 Respiratory failure, unspecified, unspecified whether with hypoxia or hypercapnia: Secondary | ICD-10-CM | POA: Diagnosis not present

## 2018-12-22 DIAGNOSIS — N179 Acute kidney failure, unspecified: Secondary | ICD-10-CM | POA: Diagnosis not present

## 2018-12-22 DIAGNOSIS — Z720 Tobacco use: Secondary | ICD-10-CM | POA: Diagnosis not present

## 2018-12-22 DIAGNOSIS — J9622 Acute and chronic respiratory failure with hypercapnia: Secondary | ICD-10-CM | POA: Diagnosis not present

## 2018-12-22 DIAGNOSIS — J189 Pneumonia, unspecified organism: Secondary | ICD-10-CM | POA: Diagnosis not present

## 2018-12-22 DIAGNOSIS — J9621 Acute and chronic respiratory failure with hypoxia: Secondary | ICD-10-CM | POA: Diagnosis not present

## 2018-12-22 DIAGNOSIS — F17200 Nicotine dependence, unspecified, uncomplicated: Secondary | ICD-10-CM | POA: Diagnosis not present

## 2018-12-22 DIAGNOSIS — K5090 Crohn's disease, unspecified, without complications: Secondary | ICD-10-CM | POA: Diagnosis not present

## 2018-12-22 DIAGNOSIS — J449 Chronic obstructive pulmonary disease, unspecified: Secondary | ICD-10-CM | POA: Diagnosis not present

## 2018-12-22 DIAGNOSIS — J441 Chronic obstructive pulmonary disease with (acute) exacerbation: Secondary | ICD-10-CM | POA: Diagnosis not present

## 2018-12-22 DIAGNOSIS — J8410 Pulmonary fibrosis, unspecified: Secondary | ICD-10-CM | POA: Diagnosis not present

## 2018-12-23 ENCOUNTER — Ambulatory Visit: Payer: 59 | Admitting: Internal Medicine

## 2018-12-23 DIAGNOSIS — N179 Acute kidney failure, unspecified: Secondary | ICD-10-CM | POA: Diagnosis not present

## 2018-12-23 DIAGNOSIS — J441 Chronic obstructive pulmonary disease with (acute) exacerbation: Secondary | ICD-10-CM | POA: Diagnosis not present

## 2018-12-23 DIAGNOSIS — Z720 Tobacco use: Secondary | ICD-10-CM | POA: Diagnosis not present

## 2018-12-23 DIAGNOSIS — K5090 Crohn's disease, unspecified, without complications: Secondary | ICD-10-CM | POA: Diagnosis not present

## 2018-12-23 DIAGNOSIS — J9690 Respiratory failure, unspecified, unspecified whether with hypoxia or hypercapnia: Secondary | ICD-10-CM | POA: Diagnosis not present

## 2018-12-23 DIAGNOSIS — J9622 Acute and chronic respiratory failure with hypercapnia: Secondary | ICD-10-CM | POA: Diagnosis not present

## 2018-12-23 DIAGNOSIS — F17200 Nicotine dependence, unspecified, uncomplicated: Secondary | ICD-10-CM | POA: Diagnosis not present

## 2018-12-23 DIAGNOSIS — J189 Pneumonia, unspecified organism: Secondary | ICD-10-CM | POA: Diagnosis not present

## 2018-12-25 ENCOUNTER — Ambulatory Visit (INDEPENDENT_AMBULATORY_CARE_PROVIDER_SITE_OTHER): Payer: 59 | Admitting: Internal Medicine

## 2018-12-25 ENCOUNTER — Encounter: Payer: Self-pay | Admitting: Internal Medicine

## 2018-12-25 DIAGNOSIS — J690 Pneumonitis due to inhalation of food and vomit: Secondary | ICD-10-CM

## 2018-12-25 DIAGNOSIS — F17200 Nicotine dependence, unspecified, uncomplicated: Secondary | ICD-10-CM

## 2018-12-25 DIAGNOSIS — N390 Urinary tract infection, site not specified: Secondary | ICD-10-CM

## 2018-12-25 NOTE — Assessment & Plan Note (Signed)
respiratory failure requiring intubation.  Urosepsis was a primary trigger Follow-up with Dr. Luciana Axe, a pulmonologist in [place] She is on oxygen as needed.  She stopped smoking

## 2018-12-25 NOTE — Assessment & Plan Note (Signed)
Quit smoking in October 2020

## 2018-12-25 NOTE — Progress Notes (Signed)
Virtual Visit via Telephone Note  I connected with [name] [name] on 12/25/18 at  9:30 AM EST by telephone and verified that I am speaking with the correct person using two identifiers.   I discussed the limitations, risks, security and privacy concerns of performing an evaluation and management service by telephone and the availability of in person appointments. I also discussed with the patient that there may be a patient responsible charge related to this service. The patient expressed understanding and agreed to proceed.  This is a post hospital follow-up.  Patient was hospitalized on 1021 and discharged on 1026.  She was septic.  She was intubated for respiratory failure.  She was treated for aspiration pneumonia.  She went home off blood pressure medications.  She was discharged home on oxygen.    "Admit date: 12/10/2018  Discharge date: 12/15/2018  Admitted From:Home  Disposition:  Home  Recommendations for Outpatient Follow-up:  1. Follow up with PCP in 1-2 weeks 2. Please obtain BMP/CBC in one week 3. Follow-up with gastroenterology Dr. Oneida Alar in 2 weeks for outpatient endoscopy to evaluate anemia.  She does have iron deficiency and has been given Feraheme with iron supplementation.  No overt bleeding identified during this admission.  Hemoglobin has remained stable at 7.2. 4. Continue on Levaquin as prescribed for 2 more days to complete course of treatment for E. coli bacteremia with UTI as well as multifocal pneumonia with suspected aspiration. 5. Continue on PPI daily. 6. Hold home blood pressure medications until further follow-up with PCP.  Home Health:None  Equipment/Devices:2L [place] [employer]  Discharge Condition:Stable  CODE STATUS: Full  Diet recommendation: Heart Healthy  Brief/Interim Summary: Per HPI: [name] Casaus Curryis a 48 y.o.femalewith medical history significant forbipolar affective disorder, depression/anxiety, hypertension, GERD, and Crohn's  disease who began having nausea and vomiting that started on 10/17. She has been vomiting daily and started to feel weak, dizzy, and lightheaded. She feels as though she passed a kidney stone last week as well. She denies any chest pain, shortness of breath, fevers or chills. She has been taking her medications otherwise as usual. She denies any coughing.  10/22:Patient appears to be doing much better this morning and is currently on nasal cannula oxygen. Her norepinephrine is being weaned. She appears to have E. coli bacteremia likely secondary to UTI. She is eager to have a diet. Will need PICC line placement today and femoral line removal this afternoon as it would be a 24 hours.  10/23:Patient overall doing much better this morning. She is on low-dose norepinephrine which is currently being weaned. She is noted to have E. coli bacteremia related to urinary tract infection. Continue IV Zosyn.  10/24:Patient doing well this morning, but remains on low-dose norepinephrine which is still being tapered. Continues on IV Zosyn with no acute events noted overnight. She is eager to go home.  10/25:Patient has remained off norepinephrine since 3 PM on 10/24, but continues to have some soft blood pressure measurements. She remains on nasal cannula oxygen and has not ambulated. She has some worsening anemia for which I will reevaluate stool occult to ensure that there is no sign of any bleeding prior to discharge. Anticipate discharge in a.m. if stable hemoglobin levels with no occult bleeding noted. We will try to wean oxygen today.  10/26: Patient continues to do well this morning with no symptomatic complaints or concerns.  Hemoglobin remains low at 7.2, but this has been stable with no signs of overt bleeding identified.  She has remained off norepinephrine and has ambulated with some hypoxemia noted for which she will be discharged on 2 L nasal cannula oxygen.  She is eager to go  home and will be discharged on Levaquin for 2 more days to complete course of treatment.  I have discussed with her the need to follow-up with her gastroenterologist Dr. Oneida Alar for endoscopy in the outpatient setting given her anemia.  She will require repeat blood work in the next 1 week with her PCP to ensure stability as well.  She is to hold her home blood pressure medications until further follow-up with PCP.  She will be discharged after she receives her home 2 L nasal cannula oxygen.  Discharge Diagnoses:  Active Problems:   Sepsis ([place])  Principal discharge diagnosis: Septic shock secondary to E. coli bacteremia related to UTI as well as multifocal pneumonia with aspiration. "             Observations/Objective:  She has no acute distress.  She does not sound dyspneic or hoarse.  She is upbeat Assessment and Plan: See assessment and plan  Follow Up Instructions:    I discussed the assessment and treatment plan with the patient. The patient was provided an opportunity to ask questions and all were answered. The patient agreed with the plan and demonstrated an understanding of the instructions.   The patient was advised to call back or seek an in-person evaluation if the symptoms worsen or if the condition fails to improve as anticipated.  I provided 22 minutes of non-face-to-face time during this encounter.   [name], MD

## 2018-12-25 NOTE — Assessment & Plan Note (Signed)
[facility] work

## 2018-12-28 ENCOUNTER — Other Ambulatory Visit: Payer: Self-pay | Admitting: Obstetrics and Gynecology

## 2018-12-29 ENCOUNTER — Telehealth: Payer: Self-pay

## 2018-12-29 NOTE — Telephone Encounter (Signed)
I received a VM from [place], a registered nurse that this pt's PA will expire on 02/04/2019. It is good until then. But when I do new PA will need to have pt do at another facility due to changes in her insurance.  Case # [id number] call back number: [phone].  The insurance will not cover it to be done at [facility] any longer after 02/04/2019. She did mention a place called [facility] on [address] that she might could do it at. Will forward the note to Dr. Oneida Alar for recommendations.

## 2018-12-29 NOTE — Telephone Encounter (Signed)
refil norethindrone x 1. Will send pt appt request.

## 2018-12-30 NOTE — Telephone Encounter (Addendum)
Dr. Oneida Alar, ( see MY CHART MESSAGE):  Pt was in hospital recently with double pneumonia. [name] [name] is doing better, but wondered if [name] [name] should have her Entyvio as scheduled around Nov 30th, or should [name] [name] wait longer.Please advise!

## 2019-01-01 ENCOUNTER — Telehealth: Payer: Self-pay | Admitting: *Deleted

## 2019-01-01 DIAGNOSIS — K50013 Crohn's disease of small intestine with fistula: Secondary | ICD-10-CM

## 2019-01-01 NOTE — Telephone Encounter (Signed)
SEE MY CHART MESSAGE.

## 2019-01-01 NOTE — Telephone Encounter (Signed)
Caren Griffins called back and said that the pt will be able to continue her infusions at [facility].  I will just need to do a PA for the medication before 02/04/2019.

## 2019-01-01 NOTE — Telephone Encounter (Signed)
Lmom for pt to call us back.

## 2019-01-01 NOTE — Telephone Encounter (Signed)
Another phone call from [place] at [facility] She wanted to know what she can do to help. She is going to check on pt's Medicare insurance ( she mentioned medicare type G). She said pt may be exempt from having to move to another facility for the infusions. She told me not to do anything until she calls me back.

## 2019-01-01 NOTE — Telephone Encounter (Signed)
Pt has an ov for 01/29/2019 (first available with [facility]).  Pt says that she is supposed to be seen 2 to 3 weeks for a follow up.  Offered a sooner appt with an extender.  Pt says it has to be with [facility].  Can pt wait until 01/29/2019?

## 2019-01-01 NOTE — Telephone Encounter (Signed)
YES PT CAN WAIT UNTIL DEC 2020. SHE SHOULD CALL OR SEND A MY CHART MESSAGE IF SHE SEES RECTAL BLEEDING OR IF SHE HAS BLACK TARRY STOOLS.

## 2019-01-01 NOTE — Telephone Encounter (Signed)
Called pt and informed her to keep her regularly scheduled appointment with you.  Pt was advised to call or send a my chart message if any rectal bleeding or black tarry stools.  Pt voiced understanding.  Pt requested me to ask you if she will need blood work before her visit and wants [place] to place order in system if so.  She said she could go anytime if you needed her too.  She said that she is concerned since it has been 2 weeks out since her hospital stay.

## 2019-01-01 NOTE — Telephone Encounter (Signed)
I REVIEWED THE NOTES FROM HOSPITAL STAY. YOU SHOULD HOLD OFF ON ENTYVIO UNTIL AFTER NOV 28. PT NEEDS AN OFFICE VISIT TO DISCUSS YOUR LOW BLOOD COUNT WITHIN THE NEXT 2-3 WEEKS.

## 2019-01-02 ENCOUNTER — Other Ambulatory Visit: Payer: Self-pay | Admitting: *Deleted

## 2019-01-02 DIAGNOSIS — K50013 Crohn's disease of small intestine with fistula: Secondary | ICD-10-CM

## 2019-01-02 DIAGNOSIS — R0781 Pleurodynia: Secondary | ICD-10-CM | POA: Diagnosis not present

## 2019-01-02 DIAGNOSIS — D72829 Elevated white blood cell count, unspecified: Secondary | ICD-10-CM | POA: Diagnosis not present

## 2019-01-02 DIAGNOSIS — M791 Myalgia, unspecified site: Secondary | ICD-10-CM | POA: Diagnosis not present

## 2019-01-02 NOTE — Telephone Encounter (Signed)
I have talked with patient--- considering the seriousness of her last hospital admission, I have advised that she go to [facility] urgent care or [place] to have xray of her lungs again---this would show if pneumonia is worsening, and would avoid sepsis reoccurence---routing to dr Alain Marion, fyi....patient agrees to go

## 2019-01-02 NOTE — Addendum Note (Signed)
Addended by: Metro Kung on: 01/02/2019 10:03 AM   Modules accepted: Orders

## 2019-01-02 NOTE — Telephone Encounter (Signed)
PLEASE CALL PT. If she's not actively bleeding her blood count should be the same. SHE CAN HAVE A CBC WITHIN THE NEXT 7 DAYS TO BE SURE.

## 2019-01-02 NOTE — Telephone Encounter (Signed)
Called pt and informed her of [facility]'s recommendations.  Pt is aware to have a CBC drawn in the next 7 days.  Pt requested to have labs drawn at [facility].  Order put in and faxed to [facility].

## 2019-01-05 ENCOUNTER — Other Ambulatory Visit: Payer: Self-pay | Admitting: Internal Medicine

## 2019-01-09 ENCOUNTER — Telehealth: Payer: Self-pay | Admitting: *Deleted

## 2019-01-09 NOTE — Telephone Encounter (Signed)
Pt called in to let us know that she went to Urgent Care in [place] and had her labs drawn.  She said that she uploaded them and sent them to [place].  [phone]

## 2019-01-12 NOTE — Telephone Encounter (Signed)
RESPONDED TO PT MESSAGE.

## 2019-01-12 NOTE — Telephone Encounter (Signed)
Sending FYI to Dr. Oneida Alar.

## 2019-01-19 ENCOUNTER — Encounter (HOSPITAL_COMMUNITY): Admission: RE | Admit: 2019-01-19 | Payer: 59 | Source: Ambulatory Visit

## 2019-01-19 ENCOUNTER — Encounter: Payer: Self-pay | Admitting: Gastroenterology

## 2019-01-20 ENCOUNTER — Telehealth: Payer: Self-pay

## 2019-01-20 NOTE — Telephone Encounter (Signed)
I called the [facility] # [phone] opt 3 to start PA for Entyvio. I first spoke to [place] and then [place]. The draft number was [id number]. [name] [name] took most of the information:  J code is [phone] ICD 10 code K50.013 Failed Remicade and Humira Entyvio 300 mg IV every 8 weeks [name] [name] is aware that pt said she needed [place] exception. [name] [name] also said that the hospital will have to choose what specialty pharmacy ( from several and she is faxing me list) after the treatment in December. The previous PA is good through Dec 16 th,  and the new will start on 12/17 2020. She asked me to fax office notes and labs to them @ [phone] and this request will be reviewed.

## 2019-01-22 NOTE — Telephone Encounter (Signed)
Received paperwork from [employer] with info to fax along with the OV notes and labs and imaging. I have faxed those to [facility].

## 2019-01-27 ENCOUNTER — Other Ambulatory Visit: Payer: Self-pay

## 2019-01-27 ENCOUNTER — Encounter: Payer: Self-pay | Admitting: Gastroenterology

## 2019-01-27 ENCOUNTER — Ambulatory Visit (INDEPENDENT_AMBULATORY_CARE_PROVIDER_SITE_OTHER): Payer: 59 | Admitting: Gastroenterology

## 2019-01-27 VITALS — BP 105/58 | HR 75 | Temp 98.2°F | Ht 62.0 in | Wt 146.2 lb

## 2019-01-27 DIAGNOSIS — E559 Vitamin D deficiency, unspecified: Secondary | ICD-10-CM

## 2019-01-27 DIAGNOSIS — E538 Deficiency of other specified B group vitamins: Secondary | ICD-10-CM

## 2019-01-27 DIAGNOSIS — I701 Atherosclerosis of renal artery: Secondary | ICD-10-CM

## 2019-01-27 DIAGNOSIS — R300 Dysuria: Secondary | ICD-10-CM

## 2019-01-27 DIAGNOSIS — N824 Other female intestinal-genital tract fistulae: Secondary | ICD-10-CM

## 2019-01-27 DIAGNOSIS — K50013 Crohn's disease of small intestine with fistula: Secondary | ICD-10-CM

## 2019-01-27 MED ORDER — HYDROCODONE-ACETAMINOPHEN 10-325 MG PO TABS: 1.0000 | ORAL_TABLET | Freq: Three times a day (TID) | ORAL | 0 refills | Status: DC | PRN

## 2019-01-27 NOTE — Patient Instructions (Signed)
1. Please have labs and urine done as ordered. We will be in touch with results.  2. We will continue to work on [employer] and hopefully get you restarted in the near future.

## 2019-01-27 NOTE — Progress Notes (Signed)
Primary Care Physician: Cassandria Anger, MD  Primary Gastroenterologist:  Barney Drain, MD   Chief Complaint  Patient presents with  . recent hospitalization    reports admitted for pneumonia and was septic. Was discharged from [facility] 11/4. wants blood work done    HPI: [name] [name] is a 48 y.o. female here for follow-up.  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 Deatra Ina and Dr. Koleen Distance.  Establish care with [place] in January 2017.  Previously failed Imuran, Humira, Asacol.  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  Previously in research trial at [facility] for stem cell research.  Dr. Morton Stall 2006, could not help her with fistula per patient.  EGD/TCS 03/2015 by Dr. Oneida Alar showed severe erythema/edema/exudate and ulceration in the cecum and involving ICV. Unable to intubate the ileum. Limited exam of anal canal. Severe proctitis as well. EGD with erosive gastritis duodenitis. Biopsies from cecal and right colon with chronic active colitis. 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 Multiple bx negative for dysplasia. Duodenal bx with peptic duodenitis. Gastric bx with reactive gastropathy.  Reviewed OB/GYN note dated 05/09/2018 which noted a draining rectovaginal fistula with left buttock fluid collection.  Culture was sent and found to be staph aureus resistant to Cipro, moxifloxacin, penicillin.  There was sensitivity to clindamycin and this was sent to her pharmacy by OB/GYN.  Entyvio was held during this time.  CT pelvis in June showed evidence of rectovaginal fistula, still with soft indicating chronic fistula in the pelvis but unchanged from 2018.  Continue Entyvio per Dr. Oneida Alar.  Most recent CT October 21 without contrast showing evidence of pulmonary edema with superimposed multifocal pneumonia splenomegaly, mild  bilateral renal parenchymal atrophy, 2.1 x 1.5 cm nodular density posterior to the rectum likely representing scarring related to prior inflammatory changes collection.  No drainable fluid identified.  Hospitalized in October with sepsis secondary to E. coli bacteremia related to UTI as well as multifocal pneumonia with aspiration. After discharge, she continued to have respiratory issues and several days later readmitted at [facility] where she was on the vent again for four more days. Went home on oxygen but has since been discontinued.   Her last dose of Entyvio was on 11/25/18.  From a GI standpoint she is doing fairly well. BMs are fine. No melena, brbpr. States [name] seems to be healing her rectovaginal fistula, but will "open back up" if her stools get hard. She is interested in a study at [facility] using "fat cells to heal fistulas". COVID has put this on hold per patient. No UGI symptoms.   She is due for labs and we are in process of getting Entyvio approved again. ecent. OK. No melena, brbpr. entyvio seems like healing. Rectal vaginal fistula then would drain.   Current Outpatient Medications  Medication Sig Dispense Refill  . ALPRAZolam (XANAX) 1 MG tablet Take 1 mg by mouth 2 (two) times daily.    . cyanocobalamin (,VITAMIN B-12,) 1000 MCG/ML injection INJECT 1ML INTO THE SKIN EVERY 14 DAYS (Patient taking differently: Every 10 days) 6 mL 11  . HYDROcodone-acetaminophen (NORCO) 10-325 MG tablet Take 1 tablet by mouth every 8 (eight) hours as needed for severe pain. 90 tablet 0  . lamoTRIgine (LAMICTAL) 100 MG tablet Take 1 tablet by mouth daily.     . norethindrone (AYGESTIN) 5 MG tablet TAKE  1 TABLET BY MOUTH EVERY DAY 90 tablet 0  . pantoprazole (PROTONIX) 40 MG tablet 1 PO 30 MINUTES PRIOR TO YOUR FIRST MEAL QD 90 tablet 3  . potassium chloride (K-DUR) 10 MEQ tablet TAKE 1 TABLET BY MOUTH EVERY DAY 90 tablet 3  . sertraline (ZOLOFT) 100 MG tablet Take 200 mg by mouth  daily.     . SYRINGE-NEEDLE, DISP, 3 ML (B-D SYRINGE/NEEDLE 3CC/25GX5/8) 25G X 5/8" 3 ML MISC Use to administer B12 injection every 14 days 24 each 0  . Cholecalciferol (VITAMIN D3) 250 MCG (10000 UT) capsule Take 10,000 Units by mouth daily.    . cyclobenzaprine (FLEXERIL) 5 MG tablet TAKE 1 TABLET (5 MG TOTAL) BY MOUTH AT BEDTIME AS NEEDED FOR MUSCLE SPASMS. (Patient not taking: Reported on 01/27/2019) 30 tablet 1  . diclofenac sodium (VOLTAREN) 1 % GEL Apply 1 application topically 4 (four) times daily. 100 g 3  . diclofenac Sodium (VOLTAREN) 1 % GEL APPLY TO AFFECTED AREA 4 TIMES A DAY (Patient not taking: Reported on 01/27/2019) 100 g 3  . ferrous sulfate 325 (65 FE) MG tablet Take 1 tablet (325 mg total) by mouth daily with breakfast. (Patient not taking: Reported on 01/27/2019) 30 tablet 0  . ondansetron (ZOFRAN) 4 MG tablet Take 1 tablet (4 mg total) by mouth every 8 (eight) hours as needed for nausea or vomiting. (Patient not taking: Reported on 12/10/2018) 20 tablet 0  . promethazine (PHENERGAN) 25 MG tablet Take 1 tablet (25 mg total) by mouth 2 (two) times daily as needed for nausea. (Patient not taking: Reported on 12/10/2018) 60 tablet 0  . sodium chloride 0.9 % SOLN 250 mL with vedolizumab 300 MG SOLR 300 mg Inject 300 mg into the vein. LOADING DOSE THEN Q8 WEEKS    . varenicline (CHANTIX CONTINUING MONTH PAK) 1 MG tablet Take 1 tablet (1 mg total) by mouth 2 (two) times daily. 60 tablet 4  . varenicline (CHANTIX STARTING MONTH PAK) 0.5 MG X 11 & 1 MG X 42 tablet Take one 0.5 mg tablet by mouth once daily for 3 days, then increase to one 0.5 mg tablet twice daily for 4 days, then increase to one 1 mg tablet twice daily. (Patient not taking: Reported on 12/10/2018) 53 tablet 0  . vedolizumab (ENTYVIO) 300 MG injection Inject into the vein. Every 8 weeks     No current facility-administered medications for this visit.     Allergies as of 01/27/2019 - Review Complete 01/27/2019  Allergen  Reaction Noted  . Cephalexin    . Codeine    . Guaifenesin    . Imitrex [sumatriptan]  10/17/2011  . Morphine    . Remicade [infliximab]  05/26/2015  . Sulfonamide derivatives    . Ultram [tramadol hcl]  05/26/2015    ROS:  General: Negative for anorexia, weight loss, fever, chills, fatigue, +weakness. ENT: Negative for hoarseness, difficulty swallowing , nasal congestion. CV: Negative for chest pain, angina, palpitations, dyspnea on exertion, peripheral edema.  Respiratory: Negative for dyspnea at rest, dyspnea on exertion, cough, sputum, wheezing.  GI: See history of present illness. GU:  Negative for hematuria, urinary incontinence, urinary frequency, nocturnal urination. +dysuria. See hpi. Endo: Negative for unusual weight change.    Physical Examination:   BP (!) 105/58   Pulse 75   Temp 98.2 F (36.8 C) (Oral)   Ht 5\' 2"  (1.575 m)   Wt 146 lb 3.2 oz (66.3 kg)   LMP  (Approximate) Comment: no cycle  in years  BMI 26.74 kg/m   General: Well-nourished, well-developed in no acute distress.  Eyes: No icterus. Mouth: Oropharyngeal mucosa moist and pink , no lesions erythema or exudate. Lungs: Clear to auscultation bilaterally.  Heart: Regular rate and rhythm, no murmurs rubs or gallops.  Abdomen: Bowel sounds are normal, nontender, nondistended, no hepatosplenomegaly or masses, no abdominal bruits or hernia , no rebound or guarding.   Extremities: No lower extremity edema. No clubbing or deformities. Neuro: Alert and oriented x 4   Skin: Warm and dry, no jaundice.   Psych: Alert and cooperative, normal mood and affect.  Labs:  Lab Results  Component Value Date   CREATININE 1.19 (H) 12/15/2018   BUN 14 12/15/2018   NA 142 12/15/2018   K 3.5 12/15/2018   CL 104 12/15/2018   CO2 29 12/15/2018   Lab Results  Component Value Date   ALT 12 12/11/2018   AST 15 12/11/2018   ALKPHOS 28 (L) 12/11/2018   BILITOT 1.1 12/11/2018   Lab Results  Component Value Date    WBC 4.1 12/15/2018   HGB 7.2 (L) 12/15/2018   HCT 22.6 (L) 12/15/2018   MCV 96.2 12/15/2018   PLT 158 12/15/2018   Lab Results  Component Value Date   FOLATE 5.6 (L) 12/11/2018   Lab Results  Component Value Date   VITAMINB12 845 12/10/2018   Lab Results  Component Value Date   IRON 11 (L) 12/10/2018   TIBC 219 (L) 12/10/2018   FERRITIN 133 12/10/2018     Imaging Studies: No results found.

## 2019-01-28 LAB — CBC WITH DIFFERENTIAL/PLATELET
Basophils Absolute: 0 10*3/uL (ref 0.0–0.2)
Basos: 1 %
EOS (ABSOLUTE): 0.1 10*3/uL (ref 0.0–0.4)
Eos: 1 %
Hematocrit: 36.5 % (ref 34.0–46.6)
Hemoglobin: 12.1 g/dL (ref 11.1–15.9)
Immature Grans (Abs): 0 10*3/uL (ref 0.0–0.1)
Immature Granulocytes: 0 %
Lymphocytes Absolute: 1.5 10*3/uL (ref 0.7–3.1)
Lymphs: 25 %
MCH: 30 pg (ref 26.6–33.0)
MCHC: 33.2 g/dL (ref 31.5–35.7)
MCV: 90 fL (ref 79–97)
Monocytes Absolute: 0.3 10*3/uL (ref 0.1–0.9)
Monocytes: 5 %
Neutrophils Absolute: 4.1 10*3/uL (ref 1.4–7.0)
Neutrophils: 68 %
Platelets: 146 10*3/uL — ABNORMAL LOW (ref 150–450)
RBC: 4.04 x10E6/uL (ref 3.77–5.28)
RDW: 13 % (ref 11.7–15.4)
WBC: 6 10*3/uL (ref 3.4–10.8)

## 2019-01-28 LAB — URINALYSIS, ROUTINE W REFLEX MICROSCOPIC
Bilirubin, UA: NEGATIVE
Glucose, UA: NEGATIVE
Ketones, UA: NEGATIVE
Leukocytes,UA: NEGATIVE
Nitrite, UA: NEGATIVE
Protein,UA: NEGATIVE
RBC, UA: NEGATIVE
Specific Gravity, UA: 1.019 (ref 1.005–1.030)
Urobilinogen, Ur: 0.2 mg/dL (ref 0.2–1.0)
pH, UA: 5.5 (ref 5.0–7.5)

## 2019-01-28 LAB — COMPREHENSIVE METABOLIC PANEL
ALT: 8 IU/L (ref 0–32)
AST: 13 IU/L (ref 0–40)
Albumin/Globulin Ratio: 2.5 — ABNORMAL HIGH (ref 1.2–2.2)
Albumin: 5 g/dL — ABNORMAL HIGH (ref 3.8–4.8)
Alkaline Phosphatase: 61 IU/L (ref 39–117)
BUN/Creatinine Ratio: 11 (ref 9–23)
BUN: 17 mg/dL (ref 6–24)
Bilirubin Total: 0.4 mg/dL (ref 0.0–1.2)
CO2: 23 mmol/L (ref 20–29)
Calcium: 9.7 mg/dL (ref 8.7–10.2)
Chloride: 102 mmol/L (ref 96–106)
Creatinine, Ser: 1.51 mg/dL — ABNORMAL HIGH (ref 0.57–1.00)
GFR calc Af Amer: 46 mL/min/{1.73_m2} — ABNORMAL LOW (ref >59)
GFR calc non Af Amer: 40 mL/min/{1.73_m2} — ABNORMAL LOW (ref >59)
Globulin, Total: 2 g/dL (ref 1.5–4.5)
Glucose: 108 mg/dL — ABNORMAL HIGH (ref 65–99)
Potassium: 3.9 mmol/L (ref 3.5–5.2)
Sodium: 144 mmol/L (ref 134–144)
Total Protein: 7 g/dL (ref 6.0–8.5)

## 2019-01-28 LAB — B12 AND FOLATE PANEL
Folate: 6.2 ng/mL (ref >3.0)
Vitamin B-12: 840 pg/mL (ref 232–1245)

## 2019-01-28 LAB — IRON,TIBC AND FERRITIN PANEL
Ferritin: 213 ng/mL — ABNORMAL HIGH (ref 15–150)
Iron Saturation: 27 % (ref 15–55)
Iron: 87 ug/dL (ref 27–159)
Total Iron Binding Capacity: 322 ug/dL (ref 250–450)
UIBC: 235 ug/dL (ref 131–425)

## 2019-01-28 LAB — VITAMIN D 25 HYDROXY (VIT D DEFICIENCY, FRACTURES): Vit D, 25-Hydroxy: 82.4 ng/mL (ref 30.0–100.0)

## 2019-01-29 ENCOUNTER — Telehealth: Payer: Self-pay | Admitting: Gastroenterology

## 2019-01-29 ENCOUNTER — Ambulatory Visit: Payer: 59 | Admitting: Gastroenterology

## 2019-01-29 LAB — URINE CULTURE

## 2019-01-29 NOTE — Telephone Encounter (Signed)
Please check with lab to make sure they are processing her urine culture. Her U/A was abnormal and urine culture says "active" not pending.

## 2019-01-29 NOTE — Telephone Encounter (Signed)
Received fax from [place] for final urine culture report. This is now in [place].

## 2019-01-29 NOTE — Assessment & Plan Note (Addendum)
Clinically stable. Trying to get new approval for Humira to be given at [facility]. She is due this month. Recent hospitalizations as outlined above. Reports fistula appears to be healing while on Entyvio.   Complains of dysuria, check U/A with culture. Due for labs to follow up vitamin deficiencies, CMET, CBC. Obtain. Now.

## 2019-02-19 ENCOUNTER — Telehealth: Payer: Self-pay

## 2019-02-19 NOTE — Telephone Encounter (Signed)
I called [employer] today to follow up on the PA request. I spoke to [place] who checked and said it had been approved, although I had not received any info. PA # [id number] and was approved from 01/20/2019 through 01/19/2020. She will be faxing the paperwork to me for this.

## 2019-02-19 NOTE — Telephone Encounter (Signed)
I received the fax and pt is aware.  I am sending info to [facility] and have faxed the orders.

## 2019-02-19 NOTE — Telephone Encounter (Signed)
Pt would like to know if Dr. Oneida Alar recommends the Covid vaccination for her when it is available.

## 2019-02-19 NOTE — Telephone Encounter (Signed)
REVIEWED-NO ADDITIONAL RECOMMENDATIONS. 

## 2019-02-19 NOTE — Telephone Encounter (Signed)
PLEASE CALL PT. SHE SHOULD TAKE THE COVID VACCINE WHEN IT IS AVAILABLE.

## 2019-02-23 NOTE — Telephone Encounter (Signed)
Pt notified of results

## 2019-02-23 NOTE — Telephone Encounter (Signed)
Correction, pt was notified that she should take the covid vaccine when it's available.

## 2019-02-25 ENCOUNTER — Ambulatory Visit (INDEPENDENT_AMBULATORY_CARE_PROVIDER_SITE_OTHER): Payer: 59 | Admitting: Internal Medicine

## 2019-02-25 ENCOUNTER — Encounter: Payer: Self-pay | Admitting: Internal Medicine

## 2019-02-25 DIAGNOSIS — J690 Pneumonitis due to inhalation of food and vomit: Secondary | ICD-10-CM

## 2019-02-25 DIAGNOSIS — G8929 Other chronic pain: Secondary | ICD-10-CM

## 2019-02-25 DIAGNOSIS — M5440 Lumbago with sciatica, unspecified side: Secondary | ICD-10-CM

## 2019-02-25 DIAGNOSIS — K50013 Crohn's disease of small intestine with fistula: Secondary | ICD-10-CM

## 2019-02-25 DIAGNOSIS — I10 Essential (primary) hypertension: Secondary | ICD-10-CM | POA: Diagnosis not present

## 2019-02-25 MED ORDER — HYDROCODONE-ACETAMINOPHEN 10-325 MG PO TABS: 1.0000 | ORAL_TABLET | Freq: Two times a day (BID) | ORAL | 0 refills | Status: DC | PRN

## 2019-02-25 MED ORDER — KETOROLAC TROMETHAMINE 10 MG PO TABS: 10.0000 mg | ORAL_TABLET | Freq: Three times a day (TID) | ORAL | 0 refills | Status: DC

## 2019-02-25 NOTE — Assessment & Plan Note (Signed)
Lotensin HCT 1/2 tab/day

## 2019-02-25 NOTE — Assessment & Plan Note (Signed)
She is planning to resume [employer]

## 2019-02-25 NOTE — Assessment & Plan Note (Signed)
Norco bid prn; Toradol  Potential benefits of a long term opioids use as well as potential risks (i.e. addiction risk, apnea etc) and complications (i.e. Somnolence, constipation and others) were explained to the patient and were aknowledged.

## 2019-02-25 NOTE — Assessment & Plan Note (Signed)
Recovering

## 2019-02-25 NOTE — Progress Notes (Signed)
Virtual Visit via Telephone Note  I connected with [name] [name] on 02/25/19 at  7:50 AM EST by telephone and verified that I am speaking with the correct person using two identifiers.   I discussed the limitations, risks, security and privacy concerns of performing an evaluation and management service by telephone and the availability of in person appointments. I also discussed with the patient that there may be a patient responsible charge related to this service. The patient expressed understanding and agreed to proceed.   History of Present Illness: F/u recent pneumonia, chronic pain, colitis  Observations/Objective: The pt sounds normal on the phone  Assessment and Plan:  See plan Follow Up Instructions:    I discussed the assessment and treatment plan with the patient. The patient was provided an opportunity to ask questions and all were answered. The patient agreed with the plan and demonstrated an understanding of the instructions.   The patient was advised to call back or seek an in-person evaluation if the symptoms worsen or if the condition fails to improve as anticipated.  I provided 11 minutes of non-face-to-face time during this encounter.   [name], MD

## 2019-03-02 ENCOUNTER — Encounter (HOSPITAL_COMMUNITY): Payer: Self-pay

## 2019-03-02 ENCOUNTER — Encounter (HOSPITAL_COMMUNITY)
Admission: RE | Admit: 2019-03-02 | Discharge: 2019-03-02 | Disposition: A | Discharge status: Home / Self Care | Payer: 59 | Source: Ambulatory Visit | Attending: Gastroenterology | Admitting: Gastroenterology

## 2019-03-02 ENCOUNTER — Other Ambulatory Visit: Payer: Self-pay

## 2019-03-02 DIAGNOSIS — K5090 Crohn's disease, unspecified, without complications: Secondary | ICD-10-CM | POA: Diagnosis not present

## 2019-03-02 MED ORDER — METHYLPREDNISOLONE SODIUM SUCC 40 MG IJ SOLR
40.0000 mg | Freq: Once | INTRAMUSCULAR | Status: AC
  Administered 2019-03-02: 40 mg via INTRAVENOUS

## 2019-03-02 MED ORDER — METHYLPREDNISOLONE SODIUM SUCC 40 MG IJ SOLR
INTRAMUSCULAR | Status: AC
  Filled 2019-03-02: qty 1

## 2019-03-02 MED ORDER — SODIUM CHLORIDE 0.9 % IV SOLN: INTRAVENOUS | Status: DC

## 2019-03-02 MED ORDER — VEDOLIZUMAB 300 MG IV SOLR
300.0000 mg | Freq: Once | INTRAVENOUS | Status: AC
  Administered 2019-03-02: 300 mg via INTRAVENOUS
  Filled 2019-03-02: qty 5

## 2019-03-11 ENCOUNTER — Ambulatory Visit: Payer: 59 | Admitting: Gastroenterology

## 2019-03-21 ENCOUNTER — Other Ambulatory Visit: Payer: Self-pay | Admitting: Gastroenterology

## 2019-03-21 ENCOUNTER — Other Ambulatory Visit: Payer: Self-pay | Admitting: Obstetrics and Gynecology

## 2019-03-21 ENCOUNTER — Other Ambulatory Visit: Payer: Self-pay | Admitting: Internal Medicine

## 2019-03-21 MED ORDER — NORETHINDRONE ACETATE 5 MG PO TABS: 5.0000 mg | ORAL_TABLET | Freq: Every day | ORAL | 2 refills | Status: DC

## 2019-03-21 NOTE — Progress Notes (Signed)
refil norethindrone x 90 tab refil x 3

## 2019-04-02 ENCOUNTER — Other Ambulatory Visit: Payer: Self-pay

## 2019-04-06 MED ORDER — HYDROCODONE-ACETAMINOPHEN 10-325 MG PO TABS: 1.0000 | ORAL_TABLET | Freq: Two times a day (BID) | ORAL | 0 refills | Status: DC | PRN

## 2019-04-21 DIAGNOSIS — Z23 Encounter for immunization: Secondary | ICD-10-CM | POA: Diagnosis not present

## 2019-04-23 ENCOUNTER — Other Ambulatory Visit: Payer: Self-pay | Admitting: *Deleted

## 2019-04-23 ENCOUNTER — Other Ambulatory Visit (HOSPITAL_COMMUNITY)
Admission: RE | Admit: 2019-04-23 | Discharge: 2019-04-23 | Disposition: A | Discharge status: Home / Self Care | Payer: 59 | Source: Ambulatory Visit | Attending: Gastroenterology | Admitting: Gastroenterology

## 2019-04-23 ENCOUNTER — Other Ambulatory Visit: Payer: Self-pay

## 2019-04-23 ENCOUNTER — Encounter: Payer: Self-pay | Admitting: Gastroenterology

## 2019-04-23 ENCOUNTER — Ambulatory Visit (INDEPENDENT_AMBULATORY_CARE_PROVIDER_SITE_OTHER): Payer: 59 | Admitting: Gastroenterology

## 2019-04-23 ENCOUNTER — Telehealth: Payer: Self-pay | Admitting: Gastroenterology

## 2019-04-23 DIAGNOSIS — K50013 Crohn's disease of small intestine with fistula: Secondary | ICD-10-CM | POA: Diagnosis not present

## 2019-04-23 DIAGNOSIS — R899 Unspecified abnormal finding in specimens from other organs, systems and tissues: Secondary | ICD-10-CM

## 2019-04-23 LAB — COMPREHENSIVE METABOLIC PANEL
ALT: 14 U/L (ref 0–44)
AST: 12 U/L — ABNORMAL LOW (ref 15–41)
Albumin: 4.2 g/dL (ref 3.5–5.0)
Alkaline Phosphatase: 53 U/L (ref 38–126)
Anion gap: 8 (ref 5–15)
BUN: 19 mg/dL (ref 6–20)
CO2: 27 mmol/L (ref 22–32)
Calcium: 9.1 mg/dL (ref 8.9–10.3)
Chloride: 103 mmol/L (ref 98–111)
Creatinine, Ser: 1.32 mg/dL — ABNORMAL HIGH (ref 0.44–1.00)
GFR calc Af Amer: 55 mL/min — ABNORMAL LOW (ref >60)
GFR calc non Af Amer: 47 mL/min — ABNORMAL LOW (ref >60)
Glucose, Bld: 85 mg/dL (ref 70–99)
Potassium: 4.2 mmol/L (ref 3.5–5.1)
Sodium: 138 mmol/L (ref 135–145)
Total Bilirubin: 0.8 mg/dL (ref 0.3–1.2)
Total Protein: 6.8 g/dL (ref 6.5–8.1)

## 2019-04-23 LAB — CBC WITH DIFFERENTIAL/PLATELET
Abs Immature Granulocytes: 0.01 10*3/uL (ref 0.00–0.07)
Basophils Absolute: 0 10*3/uL (ref 0.0–0.1)
Basophils Relative: 0 %
Eosinophils Absolute: 0.1 10*3/uL (ref 0.0–0.5)
Eosinophils Relative: 1 %
HCT: 34.5 % — ABNORMAL LOW (ref 36.0–46.0)
Hemoglobin: 11.5 g/dL — ABNORMAL LOW (ref 12.0–15.0)
Immature Granulocytes: 0 %
Lymphocytes Relative: 23 %
Lymphs Abs: 1.2 10*3/uL (ref 0.7–4.0)
MCH: 30.4 pg (ref 26.0–34.0)
MCHC: 33.3 g/dL (ref 30.0–36.0)
MCV: 91.3 fL (ref 80.0–100.0)
Monocytes Absolute: 0.4 10*3/uL (ref 0.1–1.0)
Monocytes Relative: 7 %
Neutro Abs: 3.4 10*3/uL (ref 1.7–7.7)
Neutrophils Relative %: 69 %
Platelets: 132 10*3/uL — ABNORMAL LOW (ref 150–400)
RBC: 3.78 MIL/uL — ABNORMAL LOW (ref 3.87–5.11)
RDW: 13.8 % (ref 11.5–15.5)
WBC: 5 10*3/uL (ref 4.0–10.5)
nRBC: 0 % (ref 0.0–0.2)

## 2019-04-23 NOTE — Progress Notes (Signed)
CC'D TO PCP °

## 2019-04-23 NOTE — Telephone Encounter (Signed)
[phone] PLEASE CALL PATIENT, SHE WS HERE THIS MORNING AND HAS QUESTIONS ABOUT HER LABS

## 2019-04-23 NOTE — Progress Notes (Signed)
Subjective:    Patient ID: [name] [name], female    DOB: August 10, 1970, 49 y.o.   MRN: [id number]  [name], [name] Lacks, MD  HPI WHEN HAS BM STOOL LOOKS [place]. BMs: Q3 DAYS. ENTYVIO TRIES TO HEAL FISTULA BUT WHERE IT'S CONSTRICTED IT WILL POP OPEN. SEES BLOOD. THEN STOOL COMES THROUGH THERE THEN IT BURNS. GOT FIRST MODERNA SHOT([facility] [place], [place]). DIARRHEA: VERY RARE.FEELS BLOATED SOME. PUT ON WEIGHT. QUIT SMOKING: NOV 2020. FEELS MORE FATIGUED. HOSPITALIZED FOR PNA: OCT 2020/NOV 2020.   PT DENIES FEVER, CHILLS, HEMATEMESIS, nausea, vomiting, melena, CHEST PAIN, SHORTNESS OF BREATH,  CHANGE IN BOWEL IN HABITS, constipation, abdominal pain, problems/PAIN swallowing, OR heartburn or indigestion.  Past Medical History:  Diagnosis Date  . Anxiety   . Arthritis    Dr. Eddie [name]  . Bartholin cyst 2008   vag.  [name] BIPOLAR AFFECTIVE DISORDER 04/14/2007  . 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  [name] Depression   . Elevated glucose 2010  . GERD (gastroesophageal reflux disease)   . Hypertension   . Kidney stone   . LBP (low back pain)    Dr. Mina [name]  . Perianal abscess 2009  . Vitamin B12 deficiency    Past Surgical History:  Procedure Laterality Date  . COLONOSCOPY  09/2008   Dr. Derrill [name]: ulcers, edema, possible fistula openings all seen in the distal 3 cm of anus/anal canal. 1cm pseudopolyp. rest of colon and TI normal. rectal biopsy with mild chronic active colitis. ileum bx normal.  . COLONOSCOPY WITH PROPOFOL N/A 03/29/2015   SLF: Severe proctocolitis limitied to cecum and rectum. Limited exam of the colon mucosa and anal canal.   . ELBOW SURGERY     left  . ESOPHAGOGASTRODUODENOSCOPY (EGD) WITH PROPOFOL N/A 03/29/2015   SLF: 1. Erosive gastritis duodentitis.   [name] RECTOVAGINAL FISTULA CLOSURE     did not help   Allergies  Allergen Reactions  . Cephalexin     itching  . Codeine   .  Guaifenesin   . Imitrex [Sumatriptan]     2 fingers got very hot  . Morphine   . 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 Sulfonamide Derivatives   . Ultram [Tramadol Hcl]     [employer] syndrome with either remicade or ultram   Current Outpatient Medications  Medication Sig    . ALPRAZolam (XANAX) 1 MG tablet Take 1 mg by mouth 2 (two) times daily.    . benazepril-hydrochlorthiazide (LOTENSIN HCT) 20-12.5 MG tablet TAKE 1 TABLET BY MOUTH EVERY DAY (Patient taking differently: Take 0.5 tablets by mouth daily. )    . Cholecalciferol (VITAMIN D3) 250 MCG (10000 UT) capsule Take 30,000 Units by mouth once a week.    . cyanocobalamin (,VITAMIN B-12,) 1000 MCG/ML injection INJECT 1ML INTO THE SKIN EVERY 14 DAYS (Patient taking differently: Every 10 days)    . HYDROcodone-acetaminophen (NORCO) 10-325 MG tablet Take 1 tablet by mouth 2 (two) times daily as needed for severe pain.    [name] lamoTRIgine (LAMICTAL) 100 MG tablet Take 1 tablet by mouth daily.     . norethindrone (AYGESTIN) 5 MG tablet TAKE 1 TABLET BY MOUTH EVERY DAY    . pantoprazole (PROTONIX) 40 MG tablet TAKE 1 TABLET BY MOUTH 30MIN PRIOR TO FIRST MEAL    . potassium chloride (K-DUR) 10 MEQ tablet TAKE 1 TABLET BY  MOUTH EVERY DAY    . sertraline (ZOLOFT) 100 MG tablet Take 200 mg by mouth daily.     .      . vedolizumab (ENTYVIO) 300 MG injection Inject into the vein. Every 8 weeks     Review of Systems PER HPI OTHERWISE ALL SYSTEMS ARE NEGATIVE.    Objective:   Physical Exam Constitutional:      General: She is not in acute distress.    Appearance: Normal appearance.  HENT:     Mouth/Throat:     Comments: MASK IN PLACE Eyes:     General: No scleral icterus.    Pupils: Pupils are equal, round, and reactive to light.  Cardiovascular:     Rate and Rhythm: Normal rate and regular rhythm.     Pulses: Normal pulses.     Heart sounds: Normal heart sounds.  Pulmonary:      Effort: Pulmonary effort is normal.     Breath sounds: Normal breath sounds.  Abdominal:     General: Bowel sounds are normal.     Palpations: Abdomen is soft.     Tenderness: There is no abdominal tenderness.  Musculoskeletal:     Cervical back: Normal range of motion.     Right lower leg: No edema.     Left lower leg: No edema.  Lymphadenopathy:     Cervical: No cervical adenopathy.  Skin:    General: Skin is warm and dry.  Neurological:     Mental Status: She is alert and oriented to person, place, and time.     Comments: NO  NEW FOCAL DEFICITS  Psychiatric:        Mood and Affect: Mood normal.     Comments: NORMAL AFFECT        Assessment & Plan:

## 2019-04-23 NOTE — Telephone Encounter (Signed)
Spoke with pt and she said that all questions were answered when we spoke earlier.  She informed me that she would not be able to go to have Hep C antibody drawn tomorrow.  She is going on Monday.  SLF aware.

## 2019-04-23 NOTE — Progress Notes (Signed)
Opened in Error.

## 2019-04-23 NOTE — Addendum Note (Signed)
Addended by: Danie Binder on: 04/23/2019 12:03 PM   Modules accepted: Orders

## 2019-04-23 NOTE — Assessment & Plan Note (Signed)
SYMPTOMS FAIRLY WELL CONTROLLED.  COMPLETE LABS. CONTINUE ENTYVIO. FOLLOW UP IN 6 MOS.

## 2019-04-23 NOTE — Patient Instructions (Signed)
COMPLETE LABS.  CONTINUE ENTYVIO.  FOLLOW UP IN 6 MOS.

## 2019-04-27 ENCOUNTER — Encounter (HOSPITAL_COMMUNITY)
Admission: RE | Admit: 2019-04-27 | Discharge: 2019-04-27 | Disposition: A | Discharge status: Home / Self Care | Payer: 59 | Source: Ambulatory Visit | Attending: Gastroenterology | Admitting: Gastroenterology

## 2019-04-27 ENCOUNTER — Encounter (HOSPITAL_COMMUNITY): Payer: Self-pay

## 2019-04-27 ENCOUNTER — Other Ambulatory Visit: Payer: Self-pay

## 2019-04-27 DIAGNOSIS — K5090 Crohn's disease, unspecified, without complications: Secondary | ICD-10-CM | POA: Insufficient documentation

## 2019-04-27 LAB — HEPATITIS C ANTIBODY: HCV Ab: NONREACTIVE

## 2019-04-27 MED ORDER — METHYLPREDNISOLONE SODIUM SUCC 40 MG IJ SOLR
40.0000 mg | Freq: Once | INTRAMUSCULAR | Status: AC
  Administered 2019-04-27: 40 mg via INTRAVENOUS

## 2019-04-27 MED ORDER — VEDOLIZUMAB 300 MG IV SOLR
300.0000 mg | Freq: Once | INTRAVENOUS | Status: AC
  Administered 2019-04-27: 300 mg via INTRAVENOUS
  Filled 2019-04-27: qty 5

## 2019-04-27 MED ORDER — SODIUM CHLORIDE 0.9 % IV SOLN: Freq: Once | INTRAVENOUS | Status: AC

## 2019-05-06 ENCOUNTER — Encounter: Payer: Self-pay | Admitting: Internal Medicine

## 2019-05-06 ENCOUNTER — Inpatient Hospital Stay (HOSPITAL_COMMUNITY): Payer: 59 | Attending: Hematology | Admitting: Hematology

## 2019-05-06 ENCOUNTER — Other Ambulatory Visit (HOSPITAL_COMMUNITY): Payer: 59

## 2019-05-06 ENCOUNTER — Ambulatory Visit (INDEPENDENT_AMBULATORY_CARE_PROVIDER_SITE_OTHER): Payer: 59 | Admitting: Internal Medicine

## 2019-05-06 ENCOUNTER — Other Ambulatory Visit: Payer: Self-pay

## 2019-05-06 ENCOUNTER — Encounter (HOSPITAL_COMMUNITY): Payer: Self-pay | Admitting: Hematology

## 2019-05-06 DIAGNOSIS — D696 Thrombocytopenia, unspecified: Secondary | ICD-10-CM | POA: Diagnosis not present

## 2019-05-06 DIAGNOSIS — F419 Anxiety disorder, unspecified: Secondary | ICD-10-CM | POA: Insufficient documentation

## 2019-05-06 DIAGNOSIS — K219 Gastro-esophageal reflux disease without esophagitis: Secondary | ICD-10-CM | POA: Insufficient documentation

## 2019-05-06 DIAGNOSIS — F17200 Nicotine dependence, unspecified, uncomplicated: Secondary | ICD-10-CM

## 2019-05-06 DIAGNOSIS — L511 Stevens-Johnson syndrome: Secondary | ICD-10-CM | POA: Diagnosis not present

## 2019-05-06 DIAGNOSIS — R5382 Chronic fatigue, unspecified: Secondary | ICD-10-CM | POA: Diagnosis not present

## 2019-05-06 DIAGNOSIS — E538 Deficiency of other specified B group vitamins: Secondary | ICD-10-CM | POA: Diagnosis not present

## 2019-05-06 DIAGNOSIS — K50911 Crohn's disease, unspecified, with rectal bleeding: Secondary | ICD-10-CM | POA: Diagnosis not present

## 2019-05-06 DIAGNOSIS — F319 Bipolar disorder, unspecified: Secondary | ICD-10-CM | POA: Diagnosis not present

## 2019-05-06 DIAGNOSIS — D649 Anemia, unspecified: Secondary | ICD-10-CM | POA: Insufficient documentation

## 2019-05-06 DIAGNOSIS — N189 Chronic kidney disease, unspecified: Secondary | ICD-10-CM | POA: Diagnosis not present

## 2019-05-06 DIAGNOSIS — Z79899 Other long term (current) drug therapy: Secondary | ICD-10-CM | POA: Insufficient documentation

## 2019-05-06 DIAGNOSIS — K50013 Crohn's disease of small intestine with fistula: Secondary | ICD-10-CM

## 2019-05-06 DIAGNOSIS — R161 Splenomegaly, not elsewhere classified: Secondary | ICD-10-CM | POA: Insufficient documentation

## 2019-05-06 DIAGNOSIS — M1990 Unspecified osteoarthritis, unspecified site: Secondary | ICD-10-CM | POA: Diagnosis not present

## 2019-05-06 DIAGNOSIS — I129 Hypertensive chronic kidney disease with stage 1 through stage 4 chronic kidney disease, or unspecified chronic kidney disease: Secondary | ICD-10-CM | POA: Insufficient documentation

## 2019-05-06 DIAGNOSIS — E559 Vitamin D deficiency, unspecified: Secondary | ICD-10-CM | POA: Diagnosis not present

## 2019-05-06 LAB — HEPATITIS B SURFACE ANTIBODY,QUALITATIVE: Hep B S Ab: REACTIVE — AB

## 2019-05-06 LAB — IRON AND TIBC
Iron: 78 ug/dL (ref 28–170)
Saturation Ratios: 20 % (ref 10.4–31.8)
TIBC: 397 ug/dL (ref 250–450)
UIBC: 319 ug/dL

## 2019-05-06 LAB — VITAMIN B12: Vitamin B-12: 471 pg/mL (ref 180–914)

## 2019-05-06 LAB — HIV ANTIBODY (ROUTINE TESTING W REFLEX): HIV Screen 4th Generation wRfx: NONREACTIVE

## 2019-05-06 LAB — LACTATE DEHYDROGENASE: LDH: 123 U/L (ref 98–192)

## 2019-05-06 LAB — FOLATE: Folate: 10.4 ng/mL (ref >5.9)

## 2019-05-06 LAB — FERRITIN: Ferritin: 79 ng/mL (ref 11–307)

## 2019-05-06 LAB — HEPATITIS B CORE ANTIBODY, IGM: Hep B C IgM: NONREACTIVE

## 2019-05-06 LAB — HEPATITIS B SURFACE ANTIGEN: Hepatitis B Surface Ag: NONREACTIVE

## 2019-05-06 LAB — PLATELET BY CITRATE: Platelet CT in Citrate: 138

## 2019-05-06 LAB — VITAMIN D 25 HYDROXY (VIT D DEFICIENCY, FRACTURES): Vit D, 25-Hydroxy: 79.27 ng/mL (ref 30–100)

## 2019-05-06 MED ORDER — HYDROCODONE-ACETAMINOPHEN 10-325 MG PO TABS: 1.0000 | ORAL_TABLET | Freq: Two times a day (BID) | ORAL | 0 refills | Status: DC | PRN

## 2019-05-06 NOTE — Assessment & Plan Note (Signed)
Dr Barney Drain

## 2019-05-06 NOTE — Assessment & Plan Note (Signed)
Vit D 

## 2019-05-06 NOTE — Assessment & Plan Note (Signed)
Discussed.

## 2019-05-06 NOTE — Progress Notes (Addendum)
CONSULT NOTE  Patient Care Team: Plotnikov, Evie Lacks, MD as PCP - General Maia Breslow, MD (Orthopedic Surgery) Cheryll Cockayne, MD as Referring Physician (Surgical Oncology) Danie Binder, MD as Consulting Physician (Gastroenterology) Daisy Lazar, MD as Referring Physician (Pulmonary Disease)  CHIEF COMPLAINTS/PURPOSE OF CONSULTATION: Thrombocytopenia  HISTORY OF PRESENTING ILLNESS:  TONJI ELLIFF 49 y.o. female was sent here by her PCP for thrombocytopenia.  Patient reports she has known about her low platelets since 2017.  She reports she had mono and has had splenomegaly ever since.  Patient reports she has felt fine up until October 2020 when she was hospitalized for sepsis due to E. coli in her bloodstream.  She spent 4 days in the hospital.  She was released and was admitted back to the hospital a few weeks later with double pneumonia and spent 5 days on the ventilator.  Upon reviewing labs from her hospitalization her hemoglobin dropped to 7.2 at that time she was never given any blood transfusions.  She recovered on her own.  Her hemoglobin and platelets have been low ever since.  Patient reports she smoked half a pack per day from age 12 to 52 years old.  She stopped smoking when she was admitted to the hospital for pneumonia October 2020.  She reports that she occasionally sees blood in her stool due to her Crohn's disease.  She is on an immunosuppressant medication for her Crohn's disease.  She also reports she has been having severe night sweats where she soaks the entire bed and has to change her sheets and close for about 2 months now.  She denies any fevers and chills in the afternoon.  She is denies any unexplained weight loss but has gained 20 pounds over the past 3 months.  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  She has been on a number of medications and follows up closely with [facility].  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  Patient denies any regular alcohol consumption.  She denies any illicit drugs.  Patient does have CKD.  Patient denies any recent chest pain on exertion, shortness of breath on minimal exertion, presyncopal episodes or palpitations.  The patient denies any over-the-counter NSAID ingestion.  She has had no prior history or diagnosis of cancer.  Patient denies any pica.  She denies ever having a blood transfusion. Patient has a positive family history for maternal grandmother with breast cancer.  She has a maternal grandfather with colon cancer.  She also had a maternal aunt with ovarian cancer.  Patient lives at home alone and is not working at this time.   MEDICAL HISTORY:  Past Medical History:  Diagnosis Date  . Anxiety   . Arthritis    Dr. Eddie Dibbles  . Bartholin cyst 2008   vag.  [name] BIPOLAR AFFECTIVE DISORDER 04/14/2007  . Crohn's ON ENTYVIO SINCE FEB [id number]   COMPLICATED BY RECTOVAGINAL FISTULA. SJS WITH REMICADE. FAILED HUMIRA.  [name] Depression   . Elevated glucose 2010  . GERD (gastroesophageal reflux disease)   . Hypertension   . Kidney stone   . LBP (low back pain)    Dr. Mina Marble  . Perianal abscess 2009  . Vitamin B12 deficiency     SURGICAL HISTORY: Past Surgical History:  Procedure Laterality Date  . COLONOSCOPY  09/2008   Dr. Derrill Kay: ulcers, edema, possible fistula openings all seen in the distal 3 cm of anus/anal canal.  1cm pseudopolyp. rest of colon and TI normal. rectal biopsy with mild chronic active colitis. ileum bx normal.  . COLONOSCOPY WITH PROPOFOL N/A 03/29/2015   SLF: Severe proctocolitis limitied to cecum and rectum. Limited exam of the colon mucosa and anal canal.   . ELBOW SURGERY     left  . ESOPHAGOGASTRODUODENOSCOPY (EGD) WITH PROPOFOL N/A 03/29/2015   SLF: 1. Erosive gastritis duodentitis.   [name] RECTOVAGINAL FISTULA CLOSURE     did not help    SOCIAL HISTORY: Social History   Socioeconomic History  . Marital status: Married     Spouse name: Not on file  . Number of children: Not on file  . Years of education: Not on file  . Highest education level: Not on file  Occupational History  . Not on file  Tobacco Use  . Smoking status: Former Smoker    Packs/day: 0.50    Years: 25.00    Pack years: 12.50    Types: Cigarettes    Quit date: 12/21/2018    Years since quitting: 0.3  . Smokeless tobacco: Never Used  Substance and Sexual Activity  . Alcohol use: No  . Drug use: No  . Sexual activity: Not Currently    Partners: Male    Birth control/protection: Pill  Other Topics Concern  . Not on file  Social History Narrative   Regular Exercise-no   Social Determinants of Health   Financial Resource Strain:   . Difficulty of Paying Living Expenses:   Food Insecurity:   . Worried About Charity fundraiser in the Last Year:   . Arboriculturist in the Last Year:   Transportation Needs:   . Film/video editor (Medical):   [name] Lack of Transportation (Non-Medical):   Physical Activity:   . Days of Exercise per Week:   . Minutes of Exercise per Session:   Stress:   . Feeling of Stress :   Social Connections:   . Frequency of Communication with Friends and Family:   . Frequency of Social Gatherings with Friends and Family:   . Attends Religious Services:   . Active Member of Clubs or Organizations:   . Attends Archivist Meetings:   [name] Marital Status:   Intimate Partner Violence:   . Fear of Current or Ex-Partner:   . Emotionally Abused:   [name] Physically Abused:   . Sexually Abused:     FAMILY HISTORY: Family History  Problem Relation Age of Onset  . Diabetes Mother   . Heart disease Mother   . Heart attack Mother   . Cancer Maternal Aunt        ovarian  . Breast cancer Maternal Grandmother   . Early death Maternal Grandmother 64  . Cancer Maternal Grandmother        breast  . Heart attack Father   . Heart disease Father   . Cancer Maternal Grandfather        colon    ALLERGIES:   is allergic to cephalexin; codeine; guaifenesin; imitrex [sumatriptan]; morphine; remicade [infliximab]; sulfonamide derivatives; and ultram [tramadol hcl].  MEDICATIONS:  Current Outpatient Medications  Medication Sig Dispense Refill  . ALPRAZolam (XANAX) 1 MG tablet Take 1 mg by mouth 2 (two) times daily.    . benazepril-hydrochlorthiazide (LOTENSIN HCT) 20-12.5 MG tablet TAKE 1 TABLET BY MOUTH EVERY DAY (Patient taking differently: Take 0.5 tablets by mouth daily. ) 90 tablet 3  . Cholecalciferol (VITAMIN D3) 250 MCG (10000 UT) capsule Take  30,000 Units by mouth once a week.    . cyanocobalamin (,VITAMIN B-12,) 1000 MCG/ML injection INJECT 1ML INTO THE SKIN EVERY 14 DAYS (Patient taking differently: Every 10 days) 6 mL 11  . HYDROcodone-acetaminophen (NORCO) 10-325 MG tablet Take 1 tablet by mouth 2 (two) times daily as needed for severe pain. 60 tablet 0  . lamoTRIgine (LAMICTAL) 100 MG tablet Take 1 tablet by mouth daily.     . norethindrone (AYGESTIN) 5 MG tablet TAKE 1 TABLET BY MOUTH EVERY DAY 90 tablet 0  . pantoprazole (PROTONIX) 40 MG tablet TAKE 1 TABLET BY MOUTH 30MIN PRIOR TO FIRST MEAL 90 tablet 3  . potassium chloride (K-DUR) 10 MEQ tablet TAKE 1 TABLET BY MOUTH EVERY DAY 90 tablet 3  . sertraline (ZOLOFT) 100 MG tablet Take 200 mg by mouth daily.     . SYRINGE-NEEDLE, DISP, 3 ML (B-D SYRINGE/NEEDLE 3CC/25GX5/8) 25G X 5/8" 3 ML MISC Use to administer B12 injection every 14 days 24 each 0  . vedolizumab (ENTYVIO) 300 MG injection Inject into the vein. Every 8 weeks     No current facility-administered medications for this visit.    REVIEW OF SYSTEMS:   Constitutional: Denies fevers, chills or abnormal night sweats Respiratory: Denies cough, dyspnea or wheezes Cardiovascular: Denies palpitation, chest discomfort or lower extremity swelling Gastrointestinal:  Denies nausea, heartburn or change in bowel habits Skin: Denies abnormal skin rashes Lymphatics: Denies new  lymphadenopathy or easy bruising Neurological:Denies numbness, tingling or new weaknesses Behavioral/Psych: Mood is stable, no new changes  All other systems were reviewed with the patient and are negative.  PHYSICAL EXAMINATION: ECOG PERFORMANCE STATUS: 1 - Symptomatic but completely ambulatory  Vitals:   05/06/19 0900  BP: 118/78  Pulse: 71  Resp: 16  Temp: (!) 97.3 F (36.3 C)  SpO2: 100%   Filed Weights   05/06/19 0900  Weight: 163 lb (73.9 kg)    GENERAL:alert, no distress and comfortable NECK: supple, thyroid normal size, non-tender, without nodularity LYMPH:  no palpable lymphadenopathy in the cervical, axillary or inguinal LUNGS: clear to auscultation and percussion with normal breathing effort HEART: regular rate & rhythm and no murmurs and no lower extremity edema ABDOMEN:abdomen soft, non-tender and normal bowel sounds.  Splenomegaly palpable 4 fingerbreadths below the costal margin.  Liver edge is also palpable. Musculoskeletal:no cyanosis of digits and no clubbing  PSYCH: alert & oriented x 3 with fluent speech NEURO: no focal motor/sensory deficits  LABORATORY DATA:  I have reviewed the data as listed Recent Results (from the past 2160 hour(s))  Comprehensive metabolic panel     Status: Abnormal   Collection Time: 04/23/19 10:06 AM  Result Value Ref Range   Sodium 138 135 - 145 mmol/L   Potassium 4.2 3.5 - 5.1 mmol/L   Chloride 103 98 - 111 mmol/L   CO2 27 22 - 32 mmol/L   Glucose, Bld 85 70 - 99 mg/dL    Comment: Glucose reference range applies only to samples taken after fasting for at least 8 hours.   BUN 19 6 - 20 mg/dL   Creatinine, Ser 1.32 (H) 0.44 - 1.00 mg/dL   Calcium 9.1 8.9 - 10.3 mg/dL   Total Protein 6.8 6.5 - 8.1 g/dL   Albumin 4.2 3.5 - 5.0 g/dL   AST 12 (L) 15 - 41 U/L   ALT 14 0 - 44 U/L   Alkaline Phosphatase 53 38 - 126 U/L   Total Bilirubin 0.8 0.3 - 1.2 mg/dL  GFR calc non Af Amer 47 (L) >60 mL/min   GFR calc Af Amer 55 (L)  >60 mL/min   Anion gap 8 5 - 15    Comment: Performed at [facility], [address], [place], [place] [address]  CBC with Differential/Platelet     Status: Abnormal   Collection Time: 04/23/19 10:06 AM  Result Value Ref Range   WBC 5.0 4.0 - 10.5 K/uL   RBC 3.78 (L) 3.87 - 5.11 MIL/uL   Hemoglobin 11.5 (L) 12.0 - 15.0 g/dL   HCT 34.5 (L) 36.0 - 46.0 %   MCV 91.3 80.0 - 100.0 fL   MCH 30.4 26.0 - 34.0 pg   MCHC 33.3 30.0 - 36.0 g/dL   RDW 13.8 11.5 - 15.5 %   Platelets 132 (L) 150 - 400 K/uL   nRBC 0.0 0.0 - 0.2 %   Neutrophils Relative % 69 %   Neutro Abs 3.4 1.7 - 7.7 K/uL   Lymphocytes Relative 23 %   Lymphs Abs 1.2 0.7 - 4.0 K/uL   Monocytes Relative 7 %   Monocytes Absolute 0.4 0.1 - 1.0 K/uL   Eosinophils Relative 1 %   Eosinophils Absolute 0.1 0.0 - 0.5 K/uL   Basophils Relative 0 %   Basophils Absolute 0.0 0.0 - 0.1 K/uL   Immature Granulocytes 0 %   Abs Immature Granulocytes 0.01 0.00 - 0.07 K/uL    Comment: Performed at [facility], [address], [place], [place] [address]  Hepatitis C antibody     Status: None   Collection Time: 04/27/19  9:20 AM  Result Value Ref Range   HCV Ab NON REACTIVE NON REACTIVE    Comment: (NOTE) Nonreactive HCV antibody screen is consistent with no HCV infections,  unless recent infection is suspected or other evidence exists to indicate HCV infection. Performed at [facility], [place] [address], [place], [place] [address]   Platelet by Citrate     Status: None   Collection Time: 05/06/19 10:53 AM  Result Value Ref Range   Platelet CT in Citrate 138     Comment: Performed at [facility], [address], [place], [place] [address]  Copper, serum     Status: None   Collection Time: 05/06/19 10:53 AM  Result Value Ref Range   Copper 146 80 - 158 ug/dL    Comment: (NOTE) This test was developed and its performance characteristics determined by [facility]. It has not been cleared or approved by the Food and Drug  Administration.                                    Detection Limit = 5              **Please note reference interval change** Performed At: [facility] [place], [place] [id number] Rush Farmer MD IN:[id number]   HIV antibody (with reflex)     Status: None   Collection Time: 05/06/19 10:53 AM  Result Value Ref Range   HIV Screen 4th Generation wRfx NON REACTIVE NON REACTIVE    Comment: Performed at [facility] [address], [place], [place] [address]  Hepatitis B core antibody, IgM     Status: None   Collection Time: 05/06/19 10:53 AM  Result Value Ref Range   Hep B C IgM NON REACTIVE NON REACTIVE    Comment: Performed at [facility], [place] [address] [place], [place] [address]  Hepatitis B surface antigen     Status: None   Collection Time: 05/06/19 10:53 AM  Result Value Ref Range   Hepatitis B Surface Ag NON REACTIVE NON REACTIVE    Comment: Performed at [facility] [address], [place], [place] [address]  Hepatitis B surface antibody     Status: Abnormal   Collection Time: 05/06/19 10:53 AM  Result Value Ref Range   Hep B S Ab Reactive (A) NON REACTIVE    Comment: (NOTE) Consistent with immunity, greater than 9.9 mIU/mL. Performed at [facility], [place] [address] [place], [place] [address]   Rheumatoid factor     Status: None   Collection Time: 05/06/19 10:53 AM  Result Value Ref Range   Rhuematoid fact SerPl-aCnc <10.0 0.0 - 13.9 IU/mL    Comment: (NOTE) Performed At: [facility] [address] [place], [place] [id number] Rush Farmer MD OZ:[id number]   ANA, IFA (with reflex)     Status: None   Collection Time: 05/06/19 10:53 AM  Result Value Ref Range   ANA Ab, IFA Negative     Comment: (NOTE)                                     Negative   <1:80                                     Borderline  1:80                                     Positive   >1:80 Performed At: [facility] [place], [place] [id number] Rush Farmer MD KG:[id number]   VITAMIN D 25 Hydroxy (Vit-D Deficiency, Fractures)     Status: None   Collection Time: 05/06/19 10:53 AM  Result Value Ref Range   Vit D, 25-Hydroxy 79.27 30 - 100 ng/mL    Comment: (NOTE) Vitamin D deficiency has been defined by the [place] practice guideline as a level of serum 25-OH  vitamin D less than 20 ng/mL (1,2). The Endocrine Society went on to  further define vitamin D insufficiency as a level between 21 and 29  ng/mL (2). 1. IOM (Institute of Medicine). 2010. Dietary reference intakes for  calcium and D. [place]: The [employer]. 2. Holick MF, Binkley De Leon, Bischoff-Ferrari HA, et al. Evaluation,  treatment, and prevention of vitamin D deficiency: an Endocrine  Society clinical practice guideline, JCEM. 2011 Jul; 96(7): 1911-30. Performed at [facility], [place] [address], [place], [place] [address]   Lactate dehydrogenase     Status: None   Collection Time: 05/06/19 10:53 AM  Result Value Ref Range   LDH 123 98 - 192 U/L    Comment: Performed at [facility], [address] [place], [place] [address]  Protein electrophoresis, serum     Status: None   Collection Time: 05/06/19 10:54 AM  Result Value Ref Range   Total Protein ELP 6.9 6.0 - 8.5 g/dL   Albumin ELP 4.2 2.9 - 4.4 g/dL   Alpha-1-Globulin 0.3 0.0 - 0.4 g/dL   Alpha-2-Globulin 0.7 0.4 - 1.0  g/dL   Beta Globulin 1.0 0.7 - 1.3 g/dL   Gamma Globulin 0.8 0.4 - 1.8 g/dL   M-Spike, % Not Observed Not Observed g/dL   SPE Interp. Comment     Comment: (NOTE) The SPE pattern appears unremarkable. Evidence of monoclonal protein is not apparent. Performed At: [facility] [place], [place] [id number] Rush Farmer MD IN:[id number]    Comment Comment     Comment: (NOTE) Protein electrophoresis scan will follow via computer, mail, or courier delivery.    Globulin,  Total 2.7 2.2 - 3.9 g/dL   A/G Ratio 1.6 0.7 - 1.7  Folate     Status: None   Collection Time: 05/06/19 10:54 AM  Result Value Ref Range   Folate 10.4 >5.9 ng/mL    Comment: Performed at [facility] [address] [place], [place] [address]  Vitamin B12     Status: None   Collection Time: 05/06/19 10:54 AM  Result Value Ref Range   Vitamin B-12 471 180 - 914 pg/mL    Comment: (NOTE) This assay is not validated for testing neonatal or myeloproliferative syndrome specimens for Vitamin B12 levels. Performed at [facility], [address], [place], [place] [address]   Iron and TIBC     Status: None   Collection Time: 05/06/19 10:54 AM  Result Value Ref Range   Iron 78 28 - 170 ug/dL   TIBC 397 250 - 450 ug/dL   Saturation Ratios 20 10.4 - 31.8 %   UIBC 319 ug/dL    Comment: Performed at [facility], [address], [place], [place] [address]  Ferritin     Status: None   Collection Time: 05/06/19 10:54 AM  Result Value Ref Range   Ferritin 79 11 - 307 ng/mL    Comment: Performed at [facility], [address] [place], [place] [address]    RADIOGRAPHIC STUDIES: I have personally reviewed the radiological images as listed and agreed with the findings in the report. I have independently examined and elicited history from this patient.  I agree with the HPI recorded by my nurse practitioner Wenda Low, FNP. I have independently formulated my assessment and plan.  ASSESSMENT & PLAN:  Thrombocytopenia ([place]) 1.  Mild thrombocytopenia: -CBC on 04/23/2019 showed platelet count 131.  She had mildly low platelet count 02/26/2015.  It has been consistently is low since October 2019, lowest being at 72. -She denies any easy bruising or bleeding. -She denies any history of transfusion. -She reports history of splenomegaly when she had mono in her mid 91s. -We will repeat her platelet count in blue top tube.  We will review her smear.  We will also check LDH, rule out nutritional  deficiencies by checking B12, folate, methylmalonic acid, copper levels.  We will also check for ANA and rheumatoid factor and hepatitis B and C serology. -She has been on entyvio for couple of years.  I have reviewed side effects of this medication which does not commonly cause thrombocytopenia. -We will see her back in 2 to 3 weeks to discuss results.  2.  Normocytic anemia: -This is from combination of CKD.  Will check for nutritional deficiencies including ferritin and iron panel. -Last hemoglobin was 11.5 on 04/23/2019.  3.  Splenomegaly: -She reported history of splenomegaly since she was diagnosed with mono in her mid 63s. -Spleen is palpable about 4 to 5 fingerbreadths below the left costal margin.  Crohn's disease is rarely associated with splenomegaly. -She does not  have any fevers, night sweats or weight loss. -I recommended doing LDH and a PET CT scan.  4.  Crohn's disease: -She had Crohn's disease since her mid 40s. -She developed Stevens-Johnson syndrome with Remicade.  She has been switched to [facility] which she is tolerating well. 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     All questions were answered. The patient knows to call the clinic with any problems, questions or concerns.     Derek Jack, MD 05/09/19 4:11 PM

## 2019-05-06 NOTE — Progress Notes (Signed)
Subjective:  Patient ID: OTHA [name], female    DOB: Jun 05, 1970  Age: 49 y.o. MRN: [id number]  CC: No chief complaint on file.   HPI [name] presents for colitis, chronic pain, HTN, GERD f/u  Outpatient Medications Prior to Visit  Medication Sig Dispense Refill  . ALPRAZolam (XANAX) 1 MG tablet Take 1 mg by mouth 2 (two) times daily.    . benazepril-hydrochlorthiazide (LOTENSIN HCT) 20-12.5 MG tablet TAKE 1 TABLET BY MOUTH EVERY DAY (Patient taking differently: Take 0.5 tablets by mouth daily. ) 90 tablet 3  . Cholecalciferol (VITAMIN D3) 250 MCG (10000 UT) capsule Take 30,000 Units by mouth once a week.    . cyanocobalamin (,VITAMIN B-12,) 1000 MCG/ML injection INJECT 1ML INTO THE SKIN EVERY 14 DAYS (Patient taking differently: Every 10 days) 6 mL 11  . HYDROcodone-acetaminophen (NORCO) 10-325 MG tablet Take 1 tablet by mouth 2 (two) times daily as needed for severe pain. 60 tablet 0  . lamoTRIgine (LAMICTAL) 100 MG tablet Take 1 tablet by mouth daily.     . norethindrone (AYGESTIN) 5 MG tablet TAKE 1 TABLET BY MOUTH EVERY DAY 90 tablet 0  . pantoprazole (PROTONIX) 40 MG tablet TAKE 1 TABLET BY MOUTH 30MIN PRIOR TO FIRST MEAL 90 tablet 3  . potassium chloride (K-DUR) 10 MEQ tablet TAKE 1 TABLET BY MOUTH EVERY DAY 90 tablet 3  . sertraline (ZOLOFT) 100 MG tablet Take 200 mg by mouth daily.     . SYRINGE-NEEDLE, DISP, 3 ML (B-D SYRINGE/NEEDLE 3CC/25GX5/8) 25G X 5/8" 3 ML MISC Use to administer B12 injection every 14 days 24 each 0  . vedolizumab (ENTYVIO) 300 MG injection Inject into the vein. Every 8 weeks     No facility-administered medications prior to visit.    ROS: Review of Systems  Constitutional: Negative for activity change, appetite change, chills, fatigue and unexpected weight change.  HENT: Negative for congestion, mouth sores and sinus pressure.   Eyes: Negative for visual disturbance.  Respiratory: Negative for cough and chest tightness.   Gastrointestinal:  Positive for abdominal pain. Negative for nausea.  Genitourinary: Negative for difficulty urinating, frequency and vaginal pain.  Musculoskeletal: Positive for arthralgias and back pain. Negative for gait problem.  Skin: Negative for pallor and rash.  Neurological: Negative for dizziness, tremors, weakness, numbness and headaches.  Psychiatric/Behavioral: Negative for confusion and sleep disturbance.    Objective:  There were no vitals taken for this visit.  BP Readings from Last 3 Encounters:  04/27/19 118/78  04/23/19 121/67  03/02/19 (!) 103/59    Wt Readings from Last 3 Encounters:  04/23/19 162 lb (73.5 kg)  03/02/19 146 lb 2.6 oz (66.3 kg)  01/27/19 146 lb 3.2 oz (66.3 kg)    Physical Exam Constitutional:      General: She is not in acute distress.    Appearance: She is well-developed.  HENT:     Head: Normocephalic.     Right Ear: External ear normal.     Left Ear: External ear normal.     Nose: Nose normal.  Eyes:     General:        Right eye: No discharge.        Left eye: No discharge.     Conjunctiva/sclera: Conjunctivae normal.     Pupils: Pupils are equal, round, and reactive to light.  Neck:     Thyroid: No thyromegaly.     Vascular: No JVD.     Trachea: No tracheal deviation.  Cardiovascular:     Rate and Rhythm: Normal rate and regular rhythm.     Heart sounds: Normal heart sounds.  Pulmonary:     Effort: No respiratory distress.     Breath sounds: No stridor. No wheezing.  Abdominal:     General: Bowel sounds are normal. There is no distension.     Palpations: Abdomen is soft. There is no mass.     Tenderness: There is no abdominal tenderness. There is no guarding or rebound.  Musculoskeletal:        General: No tenderness.     Cervical back: Normal range of motion and neck supple.  Lymphadenopathy:     Cervical: No cervical adenopathy.  Skin:    Findings: No erythema or rash.  Neurological:     Cranial Nerves: No cranial nerve deficit.      Motor: No abnormal muscle tone.     Coordination: Coordination normal.     Deep Tendon Reflexes: Reflexes normal.  Psychiatric:        Behavior: Behavior normal.        Thought Content: Thought content normal.        Judgment: Judgment normal.     Lab Results  Component Value Date   WBC 5.0 04/23/2019   HGB 11.5 (L) 04/23/2019   HCT 34.5 (L) 04/23/2019   PLT 132 (L) 04/23/2019   GLUCOSE 85 04/23/2019   CHOL 168 08/30/2014   TRIG 99.0 08/30/2014   HDL 27.40 (L) 08/30/2014   LDLDIRECT 171.9 05/01/2011   LDLCALC 121 (H) 08/30/2014   ALT 14 04/23/2019   AST 12 (L) 04/23/2019   NA 138 04/23/2019   K 4.2 04/23/2019   CL 103 04/23/2019   CREATININE 1.32 (H) 04/23/2019   BUN 19 04/23/2019   CO2 27 04/23/2019   TSH 0.585 05/28/2018    No results found.  Assessment & Plan:    [name], MD

## 2019-05-06 NOTE — Assessment & Plan Note (Signed)
Not smoking

## 2019-05-07 LAB — RHEUMATOID FACTOR: Rheumatoid fact SerPl-aCnc: <10 IU/mL (ref 0.0–13.9)

## 2019-05-07 LAB — ANTINUCLEAR ANTIBODIES, IFA: ANA Ab, IFA: NEGATIVE

## 2019-05-08 LAB — PROTEIN ELECTROPHORESIS, SERUM
A/G Ratio: 1.6 (ref 0.7–1.7)
Albumin ELP: 4.2 g/dL (ref 2.9–4.4)
Alpha-1-Globulin: 0.3 g/dL (ref 0.0–0.4)
Alpha-2-Globulin: 0.7 g/dL (ref 0.4–1.0)
Beta Globulin: 1 g/dL (ref 0.7–1.3)
Gamma Globulin: 0.8 g/dL (ref 0.4–1.8)
Globulin, Total: 2.7 g/dL (ref 2.2–3.9)
Total Protein ELP: 6.9 g/dL (ref 6.0–8.5)

## 2019-05-08 LAB — COPPER, SERUM: Copper: 146 ug/dL (ref 80–158)

## 2019-05-09 ENCOUNTER — Encounter (HOSPITAL_COMMUNITY): Payer: Self-pay | Admitting: Hematology

## 2019-05-09 NOTE — Assessment & Plan Note (Signed)
1.  Mild thrombocytopenia: -CBC on 04/23/2019 showed platelet count 131.  She had mildly low platelet count 02/26/2015.  It has been consistently is low since October 2019, lowest being at 60. -She denies any easy bruising or bleeding. -She denies any history of transfusion. -She reports history of splenomegaly when she had mono in her mid 91s. -We will repeat her platelet count in blue top tube.  We will review her smear.  We will also check LDH, rule out nutritional deficiencies by checking B12, folate, methylmalonic acid, copper levels.  We will also check for ANA and rheumatoid factor and hepatitis B and C serology. -She has been on entyvio for couple of years.  I have reviewed side effects of this medication which does not commonly cause thrombocytopenia. -We will see her back in 2 to 3 weeks to discuss results.  2.  Normocytic anemia: -This is from combination of CKD.  Will check for nutritional deficiencies including ferritin and iron panel. -Last hemoglobin was 11.5 on 04/23/2019.  3.  Splenomegaly: -She reported history of splenomegaly since she was diagnosed with mono in her mid 13s. -Spleen is palpable about 4 to 5 fingerbreadths below the left costal margin.  Crohn's disease is rarely associated with splenomegaly. -She does not have any fevers, night sweats or weight loss. -I recommended doing LDH and a PET CT scan.  4.  Crohn's disease: -She had Crohn's disease since her mid 70s. -She developed Stevens-Johnson syndrome with Remicade.  She has been switched to [facility] which she is tolerating well. 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

## 2019-05-12 ENCOUNTER — Other Ambulatory Visit (HOSPITAL_COMMUNITY): Payer: Self-pay | Admitting: Nurse Practitioner

## 2019-05-12 LAB — METHYLMALONIC ACID, SERUM: Methylmalonic Acid, Quantitative: 190 nmol/L (ref 0–378)

## 2019-05-18 ENCOUNTER — Ambulatory Visit (HOSPITAL_COMMUNITY)
Admission: RE | Admit: 2019-05-18 | Discharge: 2019-05-18 | Disposition: A | Discharge status: Home / Self Care | Payer: 59 | Source: Ambulatory Visit | Attending: Nurse Practitioner | Admitting: Nurse Practitioner

## 2019-05-18 ENCOUNTER — Other Ambulatory Visit: Payer: Self-pay

## 2019-05-18 DIAGNOSIS — D696 Thrombocytopenia, unspecified: Secondary | ICD-10-CM | POA: Diagnosis not present

## 2019-05-18 MED ORDER — FLUDEOXYGLUCOSE F - 18 (FDG) INJECTION
8.9500 | Freq: Once | INTRAVENOUS | Status: AC | PRN
  Administered 2019-05-18: 8.95 via INTRAVENOUS

## 2019-05-21 ENCOUNTER — Encounter (HOSPITAL_COMMUNITY): Payer: Self-pay | Admitting: Hematology

## 2019-05-21 ENCOUNTER — Other Ambulatory Visit: Payer: Self-pay

## 2019-05-21 ENCOUNTER — Inpatient Hospital Stay (HOSPITAL_COMMUNITY): Payer: 59 | Attending: Hematology | Admitting: Hematology

## 2019-05-21 VITALS — BP 106/70 | HR 101 | Temp 96.9°F | Resp 16 | Wt 167.1 lb

## 2019-05-21 DIAGNOSIS — R161 Splenomegaly, not elsewhere classified: Secondary | ICD-10-CM | POA: Diagnosis not present

## 2019-05-21 DIAGNOSIS — K5090 Crohn's disease, unspecified, without complications: Secondary | ICD-10-CM | POA: Insufficient documentation

## 2019-05-21 DIAGNOSIS — D696 Thrombocytopenia, unspecified: Secondary | ICD-10-CM | POA: Insufficient documentation

## 2019-05-21 DIAGNOSIS — L511 Stevens-Johnson syndrome: Secondary | ICD-10-CM | POA: Diagnosis not present

## 2019-05-21 DIAGNOSIS — Z79899 Other long term (current) drug therapy: Secondary | ICD-10-CM | POA: Insufficient documentation

## 2019-05-21 DIAGNOSIS — N189 Chronic kidney disease, unspecified: Secondary | ICD-10-CM | POA: Diagnosis not present

## 2019-05-21 DIAGNOSIS — I129 Hypertensive chronic kidney disease with stage 1 through stage 4 chronic kidney disease, or unspecified chronic kidney disease: Secondary | ICD-10-CM | POA: Diagnosis not present

## 2019-05-21 DIAGNOSIS — D649 Anemia, unspecified: Secondary | ICD-10-CM | POA: Diagnosis not present

## 2019-05-21 NOTE — Progress Notes (Signed)
[name] [name], [place] [id number]   CLINIC:  Medical Oncology/Hematology  PCP:  [name] Anger, MD [place] [id number] [phone]   REASON FOR VISIT:  Follow-up for thrombocytopenia and splenomegaly.  CURRENT THERAPY: Observation.   INTERVAL HISTORY:  [name] 49 y.o. female seen for follow-up of thrombocytopenia and splenomegaly work-up.  Denies any easy bleeding or bruising.  Appetite is 100%.  Energy levels are 50 to 65%.  No fevers, night sweats or weight loss in the last 6 months.  Denies headaches or vision changes.   REVIEW OF SYSTEMS:  Review of Systems  All other systems reviewed and are negative.    PAST MEDICAL/SURGICAL HISTORY:  Past Medical History:  Diagnosis Date  . Anxiety   . Arthritis    Dr. Eddie Dibbles  . Bartholin cyst 2008   vag.  [name] BIPOLAR AFFECTIVE DISORDER 04/14/2007  . Crohn's ON ENTYVIO SINCE FEB [phone]   COMPLICATED BY RECTOVAGINAL FISTULA. SJS WITH REMICADE. FAILED HUMIRA.  [name] Depression   . Elevated glucose 2010  . GERD (gastroesophageal reflux disease)   . Hypertension   . Kidney stone   . LBP (low back pain)    Dr. Mina Marble  . Perianal abscess 2009  . Vitamin B12 deficiency    Past Surgical History:  Procedure Laterality Date  . COLONOSCOPY  09/2008   Dr. Derrill Kay: ulcers, edema, possible fistula openings all seen in the distal 3 cm of anus/anal canal. 1cm pseudopolyp. rest of colon and TI normal. rectal biopsy with mild chronic active colitis. ileum bx normal.  . COLONOSCOPY WITH PROPOFOL N/A 03/29/2015   SLF: Severe proctocolitis limitied to cecum and rectum. Limited exam of the colon mucosa and anal canal.   . ELBOW SURGERY     left  . ESOPHAGOGASTRODUODENOSCOPY (EGD) WITH PROPOFOL N/A 03/29/2015   SLF: 1. Erosive gastritis duodentitis.   [name] RECTOVAGINAL FISTULA CLOSURE     did not help     SOCIAL HISTORY:  Social History   Socioeconomic History  . Marital status: Married      Spouse name: Not on file  . Number of children: Not on file  . Years of education: Not on file  . Highest education level: Not on file  Occupational History  . Not on file  Tobacco Use  . Smoking status: Former Smoker    Packs/day: 0.50    Years: 25.00    Pack years: 12.50    Types: Cigarettes    Quit date: 12/21/2018    Years since quitting: 0.4  . Smokeless tobacco: Never Used  Substance and Sexual Activity  . Alcohol use: No  . Drug use: No  . Sexual activity: Not Currently    Partners: Male    Birth control/protection: Pill  Other Topics Concern  . Not on file  Social History Narrative   Regular Exercise-no   Social Determinants of Health   Financial Resource Strain:   . Difficulty of Paying Living Expenses:   Food Insecurity:   . Worried About Charity fundraiser in the Last Year:   . Arboriculturist in the Last Year:   Transportation Needs:   . Film/video editor (Medical):   [name] Lack of Transportation (Non-Medical):   Physical Activity:   . Days of Exercise per Week:   . Minutes of Exercise per Session:   Stress:   . Feeling of Stress :   Social Connections:   .  Frequency of Communication with Friends and Family:   . Frequency of Social Gatherings with Friends and Family:   . Attends Religious Services:   . Active Member of Clubs or Organizations:   . Attends Archivist Meetings:   [name] Marital Status:   Intimate Partner Violence:   . Fear of Current or Ex-Partner:   . Emotionally Abused:   [name] Physically Abused:   . Sexually Abused:     FAMILY HISTORY:  Family History  Problem Relation Age of Onset  . Diabetes Mother   . Heart disease Mother   . Heart attack Mother   . Cancer Maternal Aunt        ovarian  . Breast cancer Maternal Grandmother   . Early death Maternal Grandmother 6  . Cancer Maternal Grandmother        breast  . Heart attack Father   . Heart disease Father   . Cancer Maternal Grandfather        colon    CURRENT  MEDICATIONS:  Outpatient Encounter Medications as of 05/21/2019  Medication Sig Note  . ALPRAZolam (XANAX) 1 MG tablet Take 1 mg by mouth 2 (two) times daily.   . benazepril-hydrochlorthiazide (LOTENSIN HCT) 20-12.5 MG tablet TAKE 1 TABLET BY MOUTH EVERY DAY (Patient taking differently: Take 0.5 tablets by mouth daily. )   . BREO ELLIPTA 100-25 MCG/INH AEPB 1 puff daily.   . Cholecalciferol (VITAMIN D3) 250 MCG (10000 UT) capsule Take 30,000 Units by mouth once a week.   . cyanocobalamin (,VITAMIN B-12,) 1000 MCG/ML injection INJECT 1ML INTO THE SKIN EVERY 14 DAYS (Patient taking differently: Every 10 days)   . HYDROcodone-acetaminophen (NORCO) 10-325 MG tablet Take 1 tablet by mouth 2 (two) times daily as needed for severe pain.   [name] lamoTRIgine (LAMICTAL) 100 MG tablet Take 1 tablet by mouth daily.  03/02/2015: Received from: External Pharmacy Received Sig:   . norethindrone (AYGESTIN) 5 MG tablet TAKE 1 TABLET BY MOUTH EVERY DAY   . pantoprazole (PROTONIX) 40 MG tablet TAKE 1 TABLET BY MOUTH 30MIN PRIOR TO FIRST MEAL   . potassium chloride (K-DUR) 10 MEQ tablet TAKE 1 TABLET BY MOUTH EVERY DAY   . sertraline (ZOLOFT) 100 MG tablet Take 200 mg by mouth daily.    . SYRINGE-NEEDLE, DISP, 3 ML (B-D SYRINGE/NEEDLE 3CC/25GX5/8) 25G X 5/8" 3 ML MISC Use to administer B12 injection every 14 days   . vedolizumab (ENTYVIO) 300 MG injection Inject into the vein. Every 8 weeks   . [DISCONTINUED] lamoTRIgine (LAMICTAL) 200 MG tablet Take 200 mg by mouth every morning.    No facility-administered encounter medications on file as of 05/21/2019.    ALLERGIES:  Allergies  Allergen Reactions  . Cephalexin     itching  . Codeine   . Guaifenesin   . Imitrex [Sumatriptan]     2 fingers got very hot  . Morphine   . 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 Sulfonamide Derivatives   . Ultram [Tramadol Hcl]     [employer] syndrome with either remicade or ultram      PHYSICAL EXAM:  ECOG Performance status: 1  Vitals:   05/21/19 1152  BP: 106/70  Pulse: (!) 101  Resp: 16  Temp: (!) 96.9 F (36.1 C)  SpO2: 100%   Filed Weights   05/21/19 1152  Weight: 167 lb 1.6 oz (75.8 kg)    Physical Exam Vitals reviewed.  Constitutional:      Appearance: Normal appearance.  Neurological:     General: No focal deficit present.     Mental Status: She is alert and oriented to person, place, and time.  Psychiatric:        Mood and Affect: Mood normal.        Behavior: Behavior normal.      LABORATORY DATA:  I have reviewed the labs as listed.  CBC    Component Value Date/Time   WBC 5.0 04/23/2019 1006   RBC 3.78 (L) 04/23/2019 1006   HGB 11.5 (L) 04/23/2019 1006   HGB 12.1 01/27/2019 1002   HCT 34.5 (L) 04/23/2019 1006   HCT 36.5 01/27/2019 1002   PLT 132 (L) 04/23/2019 1006   PLT 146 (L) 01/27/2019 1002   MCV 91.3 04/23/2019 1006   MCV 90 01/27/2019 1002   MCH 30.4 04/23/2019 1006   MCHC 33.3 04/23/2019 1006   RDW 13.8 04/23/2019 1006   RDW 13.0 01/27/2019 1002   LYMPHSABS 1.2 04/23/2019 1006   LYMPHSABS 1.5 01/27/2019 1002   MONOABS 0.4 04/23/2019 1006   EOSABS 0.1 04/23/2019 1006   EOSABS 0.1 01/27/2019 1002   BASOSABS 0.0 04/23/2019 1006   BASOSABS 0.0 01/27/2019 1002   CMP Latest Ref Rng & Units 04/23/2019 01/27/2019 12/15/2018  Glucose 70 - 99 mg/dL 85 108(H) 91  BUN 6 - 20 mg/dL 19 17 14   Creatinine 0.44 - 1.00 mg/dL 1.32(H) 1.51(H) 1.19(H)  Sodium 135 - 145 mmol/L 138 144 142  Potassium 3.5 - 5.1 mmol/L 4.2 3.9 3.5  Chloride 98 - 111 mmol/L 103 102 104  CO2 22 - 32 mmol/L 27 23 29   Calcium 8.9 - 10.3 mg/dL 9.1 9.7 8.4(L)  Total Protein 6.5 - 8.1 g/dL 6.8 7.0 -  Total Bilirubin 0.3 - 1.2 mg/dL 0.8 0.4 -  Alkaline Phos 38 - 126 U/L 53 61 -  AST 15 - 41 U/L 12(L) 13 -  ALT 0 - 44 U/L 14 8 -       DIAGNOSTIC IMAGING:  I have independently reviewed the scans and discussed with the patient.   ASSESSMENT &  PLAN:   Thrombocytopenia ([place]) 1.  Mild thrombocytopenia: -CBC on 04/23/2019 with platelet count 131.  Mildly low platelet count since 2017. -No prior history of easy bruising or bleeding. -We reviewed labs from 05/06/2019.  Platelet count in blue top tube is 138.  M25, folic acid, methylmalonic acid, copper levels are normal. -SPEP is negative.  Hepatitis serology was negative.  ANA and rheumatoid factor was negative. -Mild thrombocytopenia likely from splenomegaly.  No intervention necessary. -We will see her back in 6 months with repeat CBC.  2.  Splenomegaly: -Spleen palpable 4 to 5 fingerbreadths below the left costal margin.  She reports history of splenomegaly since she was diagnosed with mono in her mid 53s. -Crohn's disease is rarely associated with splenomegaly. -LDH is normal.  I reviewed PET scan results from 05/18/2019 which showed splenomegaly.  No hypermetabolic lesions.  3.  Crohn's disease: -Crohn's disease since her mid 79s. -She has developed Stevens-Johnson syndrome with Remicade. -She is switched to [facility] and has been tolerating well.  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  4.  Normocytic anemia: -She has mild normocytic anemia with hemoglobin 11.5 on 04/23/2019.  This is from combination of CKD.      Orders placed this encounter:  Orders Placed This Encounter  Procedures  . CBC with Differential  Derek Jack, MD [place] [phone]

## 2019-05-21 NOTE — Assessment & Plan Note (Signed)
1.  Mild thrombocytopenia: -CBC on 04/23/2019 with platelet count 131.  Mildly low platelet count since 2017. -No prior history of easy bruising or bleeding. -We reviewed labs from 05/06/2019.  Platelet count in blue top tube is 138.  123456, folic acid, methylmalonic acid, copper levels are normal. -SPEP is negative.  Hepatitis serology was negative.  ANA and rheumatoid factor was negative. -Mild thrombocytopenia likely from splenomegaly.  No intervention necessary. -We will see her back in 6 months with repeat CBC.  2.  Splenomegaly: -Spleen palpable 4 to 5 fingerbreadths below the left costal margin.  She reports history of splenomegaly since she was diagnosed with mono in her mid 65s. -Crohn's disease is rarely associated with splenomegaly. -LDH is normal.  I reviewed PET scan results from 05/18/2019 which showed splenomegaly.  No hypermetabolic lesions.  3.  Crohn's disease: -Crohn's disease since her mid 60s. -She has developed Stevens-Johnson syndrome with Remicade. -She is switched to [facility] and has been tolerating well.  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  4.  Normocytic anemia: -She has mild normocytic anemia with hemoglobin 11.5 on 04/23/2019.  This is from combination of CKD.

## 2019-05-21 NOTE — Patient Instructions (Addendum)
[place] at [facility] Discharge Instructions  You were seen today by Dr. Delton Coombes. He went over your recent lab and test results. He will see you back in 6 months for labs and follow up.   Thank you for choosing [place] at [facility] to provide your oncology and hematology care.  To afford each patient quality time with our provider, please arrive at least 15 minutes before your scheduled appointment time.   If you have a lab appointment with the [facility] please come in thru the  Main Entrance and check in at the main information desk  You need to re-schedule your appointment should you arrive 10 or more minutes late.  We strive to give you quality time with our providers, and arriving late affects you and other patients whose appointments are after yours.  Also, if you no show three or more times for appointments you may be dismissed from the clinic at the providers discretion.     Again, thank you for choosing [facility].  Our hope is that these requests will decrease the amount of time that you wait before being seen by our physicians.       _____________________________________________________________  Should you have questions after your visit to [facility], please contact our office at [phone] between the hours of 8:00 a.m. and 4:30 p.m.  Voicemails left after 4:00 p.m. will not be returned until the following business day.  For prescription refill requests, have your pharmacy contact our office and allow 72 hours.    Cancer Center Support Programs:   > Cancer Support Group  2nd Tuesday of the month 1pm-2pm, Journey Room

## 2019-05-25 MED ORDER — HYDROCODONE-ACETAMINOPHEN 10-325 MG PO TABS: 1.0000 | ORAL_TABLET | Freq: Two times a day (BID) | ORAL | 0 refills | Status: DC | PRN

## 2019-05-28 ENCOUNTER — Other Ambulatory Visit: Payer: Self-pay | Admitting: Internal Medicine

## 2019-06-22 ENCOUNTER — Encounter (HOSPITAL_COMMUNITY): Admission: RE | Admit: 2019-06-22 | Payer: 59 | Source: Ambulatory Visit

## 2019-06-29 ENCOUNTER — Other Ambulatory Visit: Payer: Self-pay

## 2019-06-29 ENCOUNTER — Encounter (HOSPITAL_COMMUNITY)
Admission: RE | Admit: 2019-06-29 | Discharge: 2019-06-29 | Disposition: A | Discharge status: Home / Self Care | Payer: 59 | Source: Ambulatory Visit | Attending: Gastroenterology | Admitting: Gastroenterology

## 2019-06-29 ENCOUNTER — Encounter (HOSPITAL_COMMUNITY): Payer: Self-pay

## 2019-06-29 DIAGNOSIS — K5090 Crohn's disease, unspecified, without complications: Secondary | ICD-10-CM | POA: Diagnosis not present

## 2019-06-29 MED ORDER — VEDOLIZUMAB 300 MG IV SOLR
300.0000 mg | Freq: Once | INTRAVENOUS | Status: AC
  Administered 2019-06-29: 09:00:00 300 mg via INTRAVENOUS
  Filled 2019-06-29: qty 5

## 2019-06-29 MED ORDER — METHYLPREDNISOLONE SODIUM SUCC 40 MG IJ SOLR
40.0000 mg | Freq: Once | INTRAMUSCULAR | Status: AC
  Administered 2019-06-29: 09:00:00 40 mg via INTRAVENOUS
  Filled 2019-06-29: qty 1

## 2019-06-29 MED ORDER — SODIUM CHLORIDE 0.9 % IV SOLN: Freq: Once | INTRAVENOUS | Status: AC

## 2019-07-06 ENCOUNTER — Other Ambulatory Visit: Payer: Self-pay

## 2019-07-06 MED ORDER — HYDROCODONE-ACETAMINOPHEN 10-325 MG PO TABS: 1.0000 | ORAL_TABLET | Freq: Two times a day (BID) | ORAL | 0 refills | Status: DC | PRN

## 2019-07-08 ENCOUNTER — Other Ambulatory Visit: Payer: Self-pay | Admitting: Internal Medicine

## 2019-07-08 MED ORDER — HYDROCODONE-ACETAMINOPHEN 10-325 MG PO TABS: 1.0000 | ORAL_TABLET | Freq: Two times a day (BID) | ORAL | 0 refills | Status: DC | PRN

## 2019-07-27 ENCOUNTER — Other Ambulatory Visit: Payer: Self-pay | Admitting: *Deleted

## 2019-07-27 MED ORDER — BENAZEPRIL-HYDROCHLOROTHIAZIDE 20-12.5 MG PO TABS: 1.0000 | ORAL_TABLET | Freq: Every day | ORAL | 1 refills | Status: DC

## 2019-07-30 ENCOUNTER — Encounter: Payer: Self-pay | Admitting: Internal Medicine

## 2019-07-30 ENCOUNTER — Ambulatory Visit (INDEPENDENT_AMBULATORY_CARE_PROVIDER_SITE_OTHER): Payer: 59 | Admitting: Internal Medicine

## 2019-07-30 ENCOUNTER — Other Ambulatory Visit: Payer: Self-pay

## 2019-07-30 DIAGNOSIS — M5440 Lumbago with sciatica, unspecified side: Secondary | ICD-10-CM | POA: Diagnosis not present

## 2019-07-30 DIAGNOSIS — F329 Major depressive disorder, single episode, unspecified: Secondary | ICD-10-CM | POA: Diagnosis not present

## 2019-07-30 DIAGNOSIS — G8929 Other chronic pain: Secondary | ICD-10-CM

## 2019-07-30 DIAGNOSIS — E538 Deficiency of other specified B group vitamins: Secondary | ICD-10-CM

## 2019-07-30 DIAGNOSIS — F32A Depression, unspecified: Secondary | ICD-10-CM

## 2019-07-30 MED ORDER — HYDROCODONE-ACETAMINOPHEN 10-325 MG PO TABS: 1.0000 | ORAL_TABLET | Freq: Two times a day (BID) | ORAL | 0 refills | Status: DC | PRN

## 2019-07-30 NOTE — Assessment & Plan Note (Signed)
Zoloft

## 2019-07-30 NOTE — Assessment & Plan Note (Signed)
On B12 

## 2019-07-30 NOTE — Progress Notes (Signed)
Subjective:  Patient ID: [name] [name], female    DOB: [birth year]  Age: 49 y.o. MRN: [id number]  CC: No chief complaint on file.   HPI CARLYON NOLASCO presents for LBP, depression, colitis Pt asked me to print her Rx's  Outpatient Medications Prior to Visit  Medication Sig Dispense Refill  . ALPRAZolam (XANAX) 1 MG tablet Take 1 mg by mouth 2 (two) times daily.    . benazepril-hydrochlorthiazide (LOTENSIN HCT) 20-12.5 MG tablet Take 1 tablet by mouth daily. 90 tablet 1  . BREO ELLIPTA 100-25 MCG/INH AEPB 1 puff daily.    . Cholecalciferol (VITAMIN D3) 250 MCG (10000 UT) capsule Take 30,000 Units by mouth once a week.    . cyanocobalamin (,VITAMIN B-12,) 1000 MCG/ML injection INJECT 1ML INTO THE SKIN EVERY 14 DAYS 6 mL 11  . HYDROcodone-acetaminophen (NORCO) 10-325 MG tablet Take 1 tablet by mouth 2 (two) times daily as needed for severe pain. 60 tablet 0  . lamoTRIgine (LAMICTAL) 100 MG tablet Take 1 tablet by mouth daily.     . norethindrone (AYGESTIN) 5 MG tablet TAKE 1 TABLET BY MOUTH EVERY DAY 90 tablet 0  . pantoprazole (PROTONIX) 40 MG tablet TAKE 1 TABLET BY MOUTH 30MIN PRIOR TO FIRST MEAL 90 tablet 3  . potassium chloride (K-DUR) 10 MEQ tablet TAKE 1 TABLET BY MOUTH EVERY DAY 90 tablet 3  . sertraline (ZOLOFT) 100 MG tablet Take 200 mg by mouth daily.     . SYRINGE-NEEDLE, DISP, 3 ML (B-D SYRINGE/NEEDLE 3CC/25GX5/8) 25G X 5/8" 3 ML MISC Use to administer B12 injection every 14 days 24 each 0  . vedolizumab (ENTYVIO) 300 MG injection Inject into the vein. Every 8 weeks     No facility-administered medications prior to visit.    ROS: Review of Systems  Constitutional: Positive for unexpected weight change. Negative for activity change, appetite change, chills and fatigue.  HENT: Negative for congestion, mouth sores and sinus pressure.   Eyes: Negative for visual disturbance.  Respiratory: Negative for cough and chest tightness.   Gastrointestinal: Negative for abdominal  pain and nausea.  Genitourinary: Negative for difficulty urinating, frequency and vaginal pain.  Musculoskeletal: Positive for back pain. Negative for gait problem.  Skin: Negative for pallor and rash.  Neurological: Negative for dizziness, tremors, weakness, numbness and headaches.  Psychiatric/Behavioral: Negative for confusion and sleep disturbance. The patient is nervous/anxious.     Objective:  BP 114/68 (BP Location: Right Arm, Patient Position: Sitting, Cuff Size: Normal)   Pulse 70   Temp 98.4 F (36.9 C) (Oral)   Ht 5' 2"  (1.575 m)   Wt 173 lb (78.5 kg)   SpO2 99%   BMI 31.64 kg/m   BP Readings from Last 3 Encounters:  07/30/19 114/68  06/29/19 113/73  05/21/19 106/70    Wt Readings from Last 3 Encounters:  07/30/19 173 lb (78.5 kg)  06/29/19 167 lb 1.7 oz (75.8 kg)  05/21/19 167 lb 1.6 oz (75.8 kg)    Physical Exam Constitutional:      General: She is not in acute distress.    Appearance: She is well-developed. She is obese.  HENT:     Head: Normocephalic.     Right Ear: External ear normal.     Left Ear: External ear normal.     Nose: Nose normal.  Eyes:     General:        Right eye: No discharge.        Left eye: No  discharge.     Conjunctiva/sclera: Conjunctivae normal.     Pupils: Pupils are equal, round, and reactive to light.  Neck:     Thyroid: No thyromegaly.     Vascular: No JVD.     Trachea: No tracheal deviation.  Cardiovascular:     Rate and Rhythm: Normal rate and regular rhythm.     Heart sounds: Normal heart sounds.  Pulmonary:     Effort: No respiratory distress.     Breath sounds: No stridor. No wheezing.  Abdominal:     General: Bowel sounds are normal. There is no distension.     Palpations: Abdomen is soft. There is no mass.     Tenderness: There is no abdominal tenderness. There is no guarding or rebound.  Musculoskeletal:        General: Tenderness present.     Cervical back: Normal range of motion and neck supple.    Lymphadenopathy:     Cervical: No cervical adenopathy.  Skin:    Findings: No erythema or rash.  Neurological:     Mental Status: She is oriented to person, place, and time.     Cranial Nerves: No cranial nerve deficit.     Motor: No abnormal muscle tone.     Coordination: Coordination normal.     Deep Tendon Reflexes: Reflexes normal.  Psychiatric:        Behavior: Behavior normal.        Thought Content: Thought content normal.        Judgment: Judgment normal.     Lab Results  Component Value Date   WBC 5.0 04/23/2019   HGB 11.5 (L) 04/23/2019   HCT 34.5 (L) 04/23/2019   PLT 132 (L) 04/23/2019   GLUCOSE 85 04/23/2019   CHOL 168 08/30/2014   TRIG 99.0 08/30/2014   HDL 27.40 (L) 08/30/2014   LDLDIRECT 171.9 05/01/2011   LDLCALC 121 (H) 08/30/2014   ALT 14 04/23/2019   AST 12 (L) 04/23/2019   NA 138 04/23/2019   K 4.2 04/23/2019   CL 103 04/23/2019   CREATININE 1.32 (H) 04/23/2019   BUN 19 04/23/2019   CO2 27 04/23/2019   TSH 0.585 05/28/2018    No results found.  Assessment & Plan:    [name], MD
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
## 2019-08-03 ENCOUNTER — Ambulatory Visit: Payer: 59 | Admitting: Internal Medicine

## 2019-08-10 ENCOUNTER — Ambulatory Visit: Payer: 59 | Admitting: Internal Medicine

## 2019-08-16 ENCOUNTER — Other Ambulatory Visit: Payer: Self-pay | Admitting: Internal Medicine

## 2019-08-17 ENCOUNTER — Encounter (HOSPITAL_COMMUNITY): Payer: 59

## 2019-08-21 ENCOUNTER — Telehealth: Payer: Self-pay | Admitting: Internal Medicine

## 2019-08-21 NOTE — Telephone Encounter (Signed)
[phone]  PLEASE CALL PATIENT, SHE HAS A QUESTION ABOUT HER ENTYVIO

## 2019-08-25 ENCOUNTER — Encounter (HOSPITAL_COMMUNITY)
Admission: RE | Admit: 2019-08-25 | Discharge: 2019-08-25 | Disposition: A | Discharge status: Home / Self Care | Payer: 59 | Source: Ambulatory Visit | Attending: Gastroenterology | Admitting: Gastroenterology

## 2019-08-25 NOTE — Telephone Encounter (Signed)
Called notified pt of providers recommendations. Cancelled appt with ll for 8/27 Please schedule with doctor carver once his schedule opens up. per ll and notify pt once appt is made

## 2019-08-25 NOTE — Telephone Encounter (Signed)
PATIENT ON SCHEDULE FOR APPT WITH DR. Abbey Chatters

## 2019-08-25 NOTE — Telephone Encounter (Signed)
Called pt verified her name and dob Pt changed appt to 8/27 @ 8am and wants to know if she is able to change her entiyvio to every 12 weeks instead of every 8 weeks because her bms are regular and the medication is working to well. 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

## 2019-08-25 NOTE — Telephone Encounter (Signed)
[name] [name] is approved for every 8 week dosing. I cannot advise going 12 weeks between dosing, as it is not FDA approved at dosing at that interval and could increase risk of developing antibodies to the medication/reaction to medication.  Patient needs to see Dr. Abbey Chatters next, to meet him and allow for further discuss regarding her medication. In the interim I would advise her to reschedule her entyvio infusion.

## 2019-09-06 MED ORDER — HYDROCODONE-ACETAMINOPHEN 10-325 MG PO TABS: 1.0000 | ORAL_TABLET | Freq: Two times a day (BID) | ORAL | 0 refills | Status: DC | PRN

## 2019-09-07 NOTE — Telephone Encounter (Signed)
Called pt to inform rx was ready for pick up, pt asked if there were several months printed for her. I informed that I had only one rx for her. Pt is requesting additional prescriptions to be printed with a note to fill at a later date on them.   Please advise and I will let pt know.

## 2019-09-08 ENCOUNTER — Other Ambulatory Visit: Payer: Self-pay | Admitting: Internal Medicine

## 2019-09-08 MED ORDER — HYDROCODONE-ACETAMINOPHEN 10-325 MG PO TABS: 1.0000 | ORAL_TABLET | Freq: Two times a day (BID) | ORAL | 0 refills | Status: DC | PRN

## 2019-09-17 ENCOUNTER — Telehealth: Payer: Self-pay

## 2019-09-17 NOTE — Telephone Encounter (Signed)
[name] [name] at [facility] endo called office, she faxed order for infusion that's scheduled for 09/23/19. She needs order to be signed and faxed back to her.

## 2019-09-18 ENCOUNTER — Telehealth: Payer: Self-pay | Admitting: Emergency Medicine

## 2019-09-18 NOTE — Telephone Encounter (Signed)
Pt has entyvio iv infusion on 8/4 order placed on providers desk

## 2019-09-18 NOTE — Telephone Encounter (Signed)
Faxed to [phone]

## 2019-09-18 NOTE — Telephone Encounter (Signed)
Faxed to [phone]

## 2019-09-18 NOTE — Telephone Encounter (Signed)
Completed and returned to [facility].

## 2019-09-23 ENCOUNTER — Encounter (HOSPITAL_COMMUNITY): Payer: Self-pay

## 2019-09-23 ENCOUNTER — Other Ambulatory Visit: Payer: Self-pay

## 2019-09-23 ENCOUNTER — Encounter (HOSPITAL_COMMUNITY)
Admission: RE | Admit: 2019-09-23 | Discharge: 2019-09-23 | Disposition: A | Discharge status: Home / Self Care | Payer: 59 | Source: Ambulatory Visit | Attending: Gastroenterology | Admitting: Gastroenterology

## 2019-09-23 DIAGNOSIS — K5090 Crohn's disease, unspecified, without complications: Secondary | ICD-10-CM | POA: Insufficient documentation

## 2019-09-23 MED ORDER — METHYLPREDNISOLONE SODIUM SUCC 40 MG IJ SOLR
40.0000 mg | Freq: Once | INTRAMUSCULAR | Status: AC
  Administered 2019-09-23: 40 mg via INTRAVENOUS
  Filled 2019-09-23: qty 1

## 2019-09-23 MED ORDER — SODIUM CHLORIDE 0.9 % IV SOLN: Freq: Once | INTRAVENOUS | Status: AC

## 2019-09-23 MED ORDER — VEDOLIZUMAB 300 MG IV SOLR
300.0000 mg | Freq: Once | INTRAVENOUS | Status: AC
  Administered 2019-09-23: 300 mg via INTRAVENOUS
  Filled 2019-09-23: qty 5

## 2019-10-01 ENCOUNTER — Encounter: Payer: Self-pay | Admitting: *Deleted

## 2019-10-01 ENCOUNTER — Telehealth: Payer: Self-pay | Admitting: *Deleted

## 2019-10-01 ENCOUNTER — Other Ambulatory Visit (HOSPITAL_COMMUNITY)
Admission: RE | Admit: 2019-10-01 | Discharge: 2019-10-01 | Disposition: A | Discharge status: Home / Self Care | Payer: 59 | Source: Ambulatory Visit | Attending: Internal Medicine | Admitting: Internal Medicine

## 2019-10-01 ENCOUNTER — Other Ambulatory Visit: Payer: Self-pay

## 2019-10-01 ENCOUNTER — Encounter: Payer: Self-pay | Admitting: Internal Medicine

## 2019-10-01 ENCOUNTER — Ambulatory Visit (INDEPENDENT_AMBULATORY_CARE_PROVIDER_SITE_OTHER): Payer: 59 | Admitting: Internal Medicine

## 2019-10-01 VITALS — BP 112/67 | HR 81 | Temp 97.5°F | Ht 62.0 in | Wt 175.4 lb

## 2019-10-01 DIAGNOSIS — K50813 Crohn's disease of both small and large intestine with fistula: Secondary | ICD-10-CM

## 2019-10-01 DIAGNOSIS — N824 Other female intestinal-genital tract fistulae: Secondary | ICD-10-CM

## 2019-10-01 DIAGNOSIS — R1319 Other dysphagia: Secondary | ICD-10-CM

## 2019-10-01 DIAGNOSIS — R1310 Dysphagia, unspecified: Secondary | ICD-10-CM | POA: Insufficient documentation

## 2019-10-01 DIAGNOSIS — K219 Gastro-esophageal reflux disease without esophagitis: Secondary | ICD-10-CM

## 2019-10-01 LAB — COMPREHENSIVE METABOLIC PANEL
ALT: 15 U/L (ref 0–44)
AST: 12 U/L — ABNORMAL LOW (ref 15–41)
Albumin: 4.3 g/dL (ref 3.5–5.0)
Alkaline Phosphatase: 50 U/L (ref 38–126)
Anion gap: 11 (ref 5–15)
BUN: 16 mg/dL (ref 6–20)
CO2: 23 mmol/L (ref 22–32)
Calcium: 9.2 mg/dL (ref 8.9–10.3)
Chloride: 104 mmol/L (ref 98–111)
Creatinine, Ser: 1.39 mg/dL — ABNORMAL HIGH (ref 0.44–1.00)
GFR calc Af Amer: 51 mL/min — ABNORMAL LOW (ref >60)
GFR calc non Af Amer: 44 mL/min — ABNORMAL LOW (ref >60)
Glucose, Bld: 113 mg/dL — ABNORMAL HIGH (ref 70–99)
Potassium: 3.8 mmol/L (ref 3.5–5.1)
Sodium: 138 mmol/L (ref 135–145)
Total Bilirubin: 0.7 mg/dL (ref 0.3–1.2)
Total Protein: 6.9 g/dL (ref 6.5–8.1)

## 2019-10-01 LAB — CBC WITH DIFFERENTIAL/PLATELET
Abs Immature Granulocytes: 0.01 10*3/uL (ref 0.00–0.07)
Basophils Absolute: 0 10*3/uL (ref 0.0–0.1)
Basophils Relative: 0 %
Eosinophils Absolute: 0.1 10*3/uL (ref 0.0–0.5)
Eosinophils Relative: 1 %
HCT: 34.4 % — ABNORMAL LOW (ref 36.0–46.0)
Hemoglobin: 11.6 g/dL — ABNORMAL LOW (ref 12.0–15.0)
Immature Granulocytes: 0 %
Lymphocytes Relative: 27 %
Lymphs Abs: 1.4 10*3/uL (ref 0.7–4.0)
MCH: 31 pg (ref 26.0–34.0)
MCHC: 33.7 g/dL (ref 30.0–36.0)
MCV: 92 fL (ref 80.0–100.0)
Monocytes Absolute: 0.3 10*3/uL (ref 0.1–1.0)
Monocytes Relative: 5 %
Neutro Abs: 3.5 10*3/uL (ref 1.7–7.7)
Neutrophils Relative %: 67 %
Platelets: 116 10*3/uL — ABNORMAL LOW (ref 150–400)
RBC: 3.74 MIL/uL — ABNORMAL LOW (ref 3.87–5.11)
RDW: 13.5 % (ref 11.5–15.5)
WBC: 5.3 10*3/uL (ref 4.0–10.5)
nRBC: 0 % (ref 0.0–0.2)

## 2019-10-01 LAB — VITAMIN D 25 HYDROXY (VIT D DEFICIENCY, FRACTURES): Vit D, 25-Hydroxy: 62.33 ng/mL (ref 30–100)

## 2019-10-01 LAB — VITAMIN B12: Vitamin B-12: 534 pg/mL (ref 180–914)

## 2019-10-01 LAB — SEDIMENTATION RATE: Sed Rate: 20 mm/hr (ref 0–22)

## 2019-10-01 LAB — C-REACTIVE PROTEIN: CRP: 0.8 mg/dL (ref <1.0)

## 2019-10-01 NOTE — H&P (View-Only) (Signed)
Primary Care Physician:  Cassandria Anger, MD Primary Gastroenterologist:  Dr. 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  She establish care with [place] in January 2017.  She has previously failed Imuran, Humira, Asacol.  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  She was reportedly in a research trial at [facility] for stem cell research at one point.    Last EGD and colonoscopy in February 2017 showed inflammation and ulceration in the cecum involving the IC valve which was unable to be intubated with colonoscope.  Also noted severe proctitis as well.  EGD showed erosive gastritis duodenitis.  Biopsies from cecal right colon with chronic active colitis and chronic inactive colitis in the rectum.  Negative for dysplasia.  According to gynecology note dated 05/09/2018 noted a draining rectovaginal fistula with left buttock fluid collection.  Cultures was sent and found to be staph aureus.  [name] was held during this time which she has been maintained on since 2017.  CT pelvis in June showed evidence of rectovaginal fistula unchanged from 2018.  Patient was continued on Entyvio with most recent CT in December 10, 2018 which showed a nodular density posterior to the rectum likely representing scarring related to prior inflammatory changes collection.  No drainable fluid identified  Patient was hospitalized in October with sepsis due to E. coli bacteremia related to UTI as well as multifocal pneumonia and aspiration.  She continues to be maintained on Entyvio every 8 weeks.  From a GI standpoint she actually appears to be doing pretty well.  Notes normal bowel movements.   No melena or hematochezia.  She feels as though her rectovaginal fistula is healing for the most part.  Her main complaint for me today is worsening dysphagia primarily with solids.  She has history of a hiatal hernia and she wishes to have this looked at again.  Past Medical History:  Diagnosis Date   Anxiety    Arthritis    Dr. Eddie Dibbles   Bartholin cyst 2008   vag.   BIPOLAR AFFECTIVE DISORDER 04/14/2007   Crohn's ON ENTYVIO SINCE FEB [id number]   COMPLICATED BY RECTOVAGINAL FISTULA. SJS WITH REMICADE. FAILED HUMIRA.   Depression    Elevated glucose 2010   GERD (gastroesophageal reflux disease)    Hypertension    Kidney stone    LBP (low back pain)    Dr. Mina Marble   Perianal abscess 2009   Vitamin B12 deficiency     Past Surgical History:  Procedure Laterality Date   COLONOSCOPY  09/2008   Dr. Derrill Kay: ulcers, edema, possible fistula openings all seen in the distal 3 cm of anus/anal canal. 1cm pseudopolyp. rest of colon and TI normal. rectal biopsy with mild chronic active colitis. ileum bx normal.   COLONOSCOPY WITH PROPOFOL N/A 03/29/2015   SLF: Severe proctocolitis limitied to cecum and rectum. Limited exam of the colon mucosa and anal canal.    ELBOW SURGERY     left   ESOPHAGOGASTRODUODENOSCOPY (EGD) WITH PROPOFOL N/A 03/29/2015   SLF: 1. Erosive gastritis duodentitis.    RECTOVAGINAL FISTULA CLOSURE     did not help    Current Outpatient Medications  Medication  Sig Dispense Refill   ALPRAZolam (XANAX) 1 MG tablet Take 1 mg by mouth 2 (two) times daily.     benazepril-hydrochlorthiazide (LOTENSIN HCT) 20-12.5 MG tablet Take 1 tablet by mouth daily. 90 tablet 1   BREO ELLIPTA 100-25 MCG/INH AEPB 1 puff daily.     Cholecalciferol (VITAMIN D3) 250 MCG (10000 UT) capsule Take 30,000 Units by mouth once a week.     cyanocobalamin (,VITAMIN B-12,) 1000 MCG/ML injection INJECT 1ML INTO THE SKIN EVERY 14 DAYS 6 mL 11   HYDROcodone-acetaminophen (NORCO) 10-325 MG  tablet Take 1 tablet by mouth 2 (two) times daily as needed for severe pain. 60 tablet 0   lamoTRIgine (LAMICTAL) 100 MG tablet Take 1 tablet by mouth daily.      norethindrone (AYGESTIN) 5 MG tablet TAKE 1 TABLET BY MOUTH EVERY DAY 90 tablet 0   pantoprazole (PROTONIX) 40 MG tablet TAKE 1 TABLET BY MOUTH 30MIN PRIOR TO FIRST MEAL 90 tablet 3   potassium chloride (KLOR-CON) 10 MEQ tablet TAKE 1 TABLET BY MOUTH EVERY DAY 90 tablet 3   sertraline (ZOLOFT) 100 MG tablet Take 200 mg by mouth daily.      SYRINGE-NEEDLE, DISP, 3 ML (B-D SYRINGE/NEEDLE 3CC/25GX5/8) 25G X 5/8" 3 ML MISC Use to administer B12 injection every 14 days 24 each 0   vedolizumab (ENTYVIO) 300 MG injection Inject into the vein. Every 8 weeks     No current facility-administered medications for this visit.    Allergies as of 10/01/2019 - Review Complete 10/01/2019  Allergen Reaction Noted   Cephalexin     Codeine     Guaifenesin     Imitrex [sumatriptan]  10/17/2011   Morphine     Remicade [infliximab]  05/26/2015   Sulfonamide derivatives     Ultram [tramadol hcl]  05/26/2015    Family History  Problem Relation Age of Onset   Diabetes Mother    Heart disease Mother    Heart attack Mother    Cancer Maternal Aunt        ovarian   Breast cancer Maternal Grandmother    Early death Maternal Grandmother 90   Cancer Maternal Grandmother        breast   Heart attack Father    Heart disease Father    Cancer Maternal Grandfather        colon    Social History   Socioeconomic History   Marital status: Married    Spouse name: Not on file   Number of children: Not on file   Years of education: Not on file   Highest education level: Not on file  Occupational History   Not on file  Tobacco Use   Smoking status: Former Smoker    Packs/day: 0.50    Years: 25.00    Pack years: 12.50    Types: Cigarettes    Quit date: 12/21/2018    Years since quitting: 0.7   Smokeless tobacco:  Never Used  Vaping Use   Vaping Use: Never used  Substance and Sexual Activity   Alcohol use: No   Drug use: No   Sexual activity: Not Currently    Partners: Male    Birth control/protection: Pill  Other Topics Concern   Not on file  Social History Narrative   Regular Exercise-no   Social Determinants of Health   Financial Resource Strain:    Difficulty of Paying Living Expenses:   Food Insecurity:    Worried About Charity fundraiser in  the Last Year:    Arboriculturist in the Last Year:   Transportation Needs:    Film/video editor (Medical):    Lack of Transportation (Non-Medical):   Physical Activity:    Days of Exercise per Week:    Minutes of Exercise per Session:   Stress:    Feeling of Stress :   Social Connections:    Frequency of Communication with Friends and Family:    Frequency of Social Gatherings with Friends and Family:    Attends Religious Services:    Active Member of Clubs or Organizations:    Attends Music therapist:    Marital Status:   Intimate Partner Violence:    Fear of Current or Ex-Partner:    Emotionally Abused:    Physically Abused:    Sexually Abused:     Subjective: Review of Systems  Constitutional: Negative for chills and fever.  HENT: Negative for congestion and hearing loss.   Eyes: Negative for blurred vision and double vision.  Respiratory: Negative for cough and shortness of breath.   Cardiovascular: Negative for chest pain and palpitations.  Gastrointestinal: Positive for heartburn. Negative for abdominal pain, blood in stool, constipation, diarrhea, melena and vomiting.       Dysphagia  Genitourinary: Negative for dysuria and urgency.  Musculoskeletal: Negative for joint pain and myalgias.  Skin: Negative for itching and rash.  Neurological: Negative for dizziness and headaches.  Psychiatric/Behavioral: Negative for depression. The patient is not nervous/anxious.         Objective: BP 112/67    Pulse 81    Temp (!) 97.5 F (36.4 C) (Temporal)    Ht 5' 2"  (1.575 m)    Wt 175 lb 6.4 oz (79.6 kg)    BMI 32.08 kg/m  Physical Exam Constitutional:      Appearance: Normal appearance.  HENT:     Head: Normocephalic and atraumatic.  Eyes:     Extraocular Movements: Extraocular movements intact.     Conjunctiva/sclera: Conjunctivae normal.  Cardiovascular:     Rate and Rhythm: Normal rate and regular rhythm.  Pulmonary:     Effort: Pulmonary effort is normal.     Breath sounds: Normal breath sounds.  Abdominal:     General: Bowel sounds are normal.     Palpations: Abdomen is soft.  Musculoskeletal:        General: No swelling. Normal range of motion.     Cervical back: Normal range of motion and neck supple.  Skin:    General: Skin is warm and dry.     Coloration: Skin is not jaundiced.  Neurological:     General: No focal deficit present.     Mental Status: She is alert and oriented to person, place, and time.  Psychiatric:        Mood and Affect: Mood normal.        Behavior: Behavior normal.      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  She has been on Entyvio now for nearly 4 years and states she feels as though she it is helping immensely. -Check CBC, CMP, inflammatory markers -Given history of vitamin deficiencies, will check vitamin D and B12 levels today  -  Etiology of patient's worsening dysphagia unclear.  At time of colonoscopy, Will also perform EGD to evaluate for peptic ulcer disease, esophagitis, gastritis, H. Pylori, duodenitis, or other. Will also evaluate for esophageal  stricture, Schatzki's ring, esophageal web or other. The risks including infection, bleed, or perforation as well as benefits, limitations, alternatives and imponderables have been reviewed with the patient. Potential for esophageal dilation, biopsy, etc. have also been reviewed.  Questions have been answered. All parties agreeable. - Continue on daily PPi therapy for chronic reflux -Further recommendations to follow   10/01/2019 12:52 PM   Disclaimer: This note was dictated with voice recognition software. Similar sounding words can inadvertently be transcribed and may not be corrected upon review.

## 2019-10-01 NOTE — Patient Instructions (Addendum)
Labs today  Schedule EGD and colonoscopy today  Continue on Entyvio  Further recommendations to follow  At [facility] we value your feedback. You may receive a survey about your visit today. Please share your experience as we strive to create trusting relationships with our patients to provide genuine, compassionate, quality care.  We appreciate your understanding and patience as we review any laboratory studies, imaging, and other diagnostic tests that are ordered as we care for you. Our office policy is 5 business days for review of these results, and any emergent or urgent results are addressed in a timely manner for your best interest. If you do not hear from our office in 1 week, please contact us.   We also encourage the use of MyChart, which contains your medical information for your review as well. If you are not enrolled in this feature, an access code is on this after visit summary for your convenience. Thank you for allowing [place] to be involved in your care.  It was great to see you today!  I hope you have a great rest of your week!!

## 2019-10-01 NOTE — Progress Notes (Signed)
Primary Care Physician:  Cassandria Anger, MD Primary Gastroenterologist:  Dr. Abbey Chatters  Chief Complaint  Patient presents with  . 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  She establish care with [place] in January 2017.  She has previously failed Imuran, Humira, Asacol.  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  She was reportedly in a research trial at [facility] for stem cell research at one point.    Last EGD and colonoscopy in February 2017 showed inflammation and ulceration in the cecum involving the IC valve which was unable to be intubated with colonoscope.  Also noted severe proctitis as well.  EGD showed erosive gastritis duodenitis.  Biopsies from cecal right colon with chronic active colitis and chronic inactive colitis in the rectum.  Negative for dysplasia.  According to gynecology note dated 05/09/2018 noted a draining rectovaginal fistula with left buttock fluid collection.  Cultures was sent and found to be staph aureus.  [name] was held during this time which she has been maintained on since 2017.  CT pelvis in June showed evidence of rectovaginal fistula unchanged from 2018.  Patient was continued on Entyvio with most recent CT in December 10, 2018 which showed a nodular density posterior to the rectum likely representing scarring related to prior inflammatory changes collection.  No drainable fluid identified  Patient was hospitalized in October with sepsis due to E. coli bacteremia related to UTI as well as multifocal pneumonia and aspiration.  She continues to be maintained on Entyvio every 8 weeks.  From a GI standpoint she actually appears to be doing pretty well.  Notes normal bowel movements.   No melena or hematochezia.  She feels as though her rectovaginal fistula is healing for the most part.  Her main complaint for me today is worsening dysphagia primarily with solids.  She has history of a hiatal hernia and she wishes to have this looked at again.  Past Medical History:  Diagnosis Date  . Anxiety   . Arthritis    Dr. Eddie Dibbles  . Bartholin cyst 2008   vag.  [name] BIPOLAR AFFECTIVE DISORDER 04/14/2007  . Crohn's ON ENTYVIO SINCE FEB [id number]   COMPLICATED BY RECTOVAGINAL FISTULA. SJS WITH REMICADE. FAILED HUMIRA.  [name] Depression   . Elevated glucose 2010  . GERD (gastroesophageal reflux disease)   . Hypertension   . Kidney stone   . LBP (low back pain)    Dr. Mina Marble  . Perianal abscess 2009  . Vitamin B12 deficiency     Past Surgical History:  Procedure Laterality Date  . COLONOSCOPY  09/2008   Dr. Derrill Kay: ulcers, edema, possible fistula openings all seen in the distal 3 cm of anus/anal canal. 1cm pseudopolyp. rest of colon and TI normal. rectal biopsy with mild chronic active colitis. ileum bx normal.  . COLONOSCOPY WITH PROPOFOL N/A 03/29/2015   SLF: Severe proctocolitis limitied to cecum and rectum. Limited exam of the colon mucosa and anal canal.   . ELBOW SURGERY     left  . ESOPHAGOGASTRODUODENOSCOPY (EGD) WITH PROPOFOL N/A 03/29/2015   SLF: 1. Erosive gastritis duodentitis.   [name] RECTOVAGINAL FISTULA CLOSURE     did not help    Current Outpatient Medications  Medication  Sig Dispense Refill  . ALPRAZolam (XANAX) 1 MG tablet Take 1 mg by mouth 2 (two) times daily.    . benazepril-hydrochlorthiazide (LOTENSIN HCT) 20-12.5 MG tablet Take 1 tablet by mouth daily. 90 tablet 1  . BREO ELLIPTA 100-25 MCG/INH AEPB 1 puff daily.    . Cholecalciferol (VITAMIN D3) 250 MCG (10000 UT) capsule Take 30,000 Units by mouth once a week.    . cyanocobalamin (,VITAMIN B-12,) 1000 MCG/ML injection INJECT 1ML INTO THE SKIN EVERY 14 DAYS 6 mL 11  . HYDROcodone-acetaminophen (NORCO) 10-325 MG  tablet Take 1 tablet by mouth 2 (two) times daily as needed for severe pain. 60 tablet 0  . lamoTRIgine (LAMICTAL) 100 MG tablet Take 1 tablet by mouth daily.     . norethindrone (AYGESTIN) 5 MG tablet TAKE 1 TABLET BY MOUTH EVERY DAY 90 tablet 0  . pantoprazole (PROTONIX) 40 MG tablet TAKE 1 TABLET BY MOUTH 30MIN PRIOR TO FIRST MEAL 90 tablet 3  . potassium chloride (KLOR-CON) 10 MEQ tablet TAKE 1 TABLET BY MOUTH EVERY DAY 90 tablet 3  . sertraline (ZOLOFT) 100 MG tablet Take 200 mg by mouth daily.     . SYRINGE-NEEDLE, DISP, 3 ML (B-D SYRINGE/NEEDLE 3CC/25GX5/8) 25G X 5/8" 3 ML MISC Use to administer B12 injection every 14 days 24 each 0  . vedolizumab (ENTYVIO) 300 MG injection Inject into the vein. Every 8 weeks     No current facility-administered medications for this visit.    Allergies as of 10/01/2019 - Review Complete 10/01/2019  Allergen Reaction Noted  . Cephalexin    . Codeine    . Guaifenesin    . Imitrex [sumatriptan]  10/17/2011  . Morphine    . Remicade [infliximab]  05/26/2015  . Sulfonamide derivatives    . Ultram [tramadol hcl]  05/26/2015    Family History  Problem Relation Age of Onset  . Diabetes Mother   . Heart disease Mother   . Heart attack Mother   . Cancer Maternal Aunt        ovarian  . Breast cancer Maternal Grandmother   . Early death Maternal Grandmother 34  . Cancer Maternal Grandmother        breast  . Heart attack Father   . Heart disease Father   . Cancer Maternal Grandfather        colon    Social History   Socioeconomic History  . Marital status: Married    Spouse name: Not on file  . Number of children: Not on file  . Years of education: Not on file  . Highest education level: Not on file  Occupational History  . Not on file  Tobacco Use  . Smoking status: Former Smoker    Packs/day: 0.50    Years: 25.00    Pack years: 12.50    Types: Cigarettes    Quit date: 12/21/2018    Years since quitting: 0.7  . Smokeless tobacco:  Never Used  Vaping Use  . Vaping Use: Never used  Substance and Sexual Activity  . Alcohol use: No  . Drug use: No  . Sexual activity: Not Currently    Partners: Male    Birth control/protection: Pill  Other Topics Concern  . Not on file  Social History Narrative   Regular Exercise-no   Social Determinants of Health   Financial Resource Strain:   . Difficulty of Paying Living Expenses:   Food Insecurity:   . Worried About Charity fundraiser in  the Last Year:   . [place] in the Last Year:   Transportation Needs:   . Film/video editor (Medical):   [name] Lack of Transportation (Non-Medical):   Physical Activity:   . Days of Exercise per Week:   . Minutes of Exercise per Session:   Stress:   . Feeling of Stress :   Social Connections:   . Frequency of Communication with Friends and Family:   . Frequency of Social Gatherings with Friends and Family:   . Attends Religious Services:   . Active Member of Clubs or Organizations:   . Attends Archivist Meetings:   [name] Marital Status:   Intimate Partner Violence:   . Fear of Current or Ex-Partner:   . Emotionally Abused:   [name] Physically Abused:   . Sexually Abused:     Subjective: Review of Systems  Constitutional: Negative for chills and fever.  HENT: Negative for congestion and hearing loss.   Eyes: Negative for blurred vision and double vision.  Respiratory: Negative for cough and shortness of breath.   Cardiovascular: Negative for chest pain and palpitations.  Gastrointestinal: Positive for heartburn. Negative for abdominal pain, blood in stool, constipation, diarrhea, melena and vomiting.       Dysphagia  Genitourinary: Negative for dysuria and urgency.  Musculoskeletal: Negative for joint pain and myalgias.  Skin: Negative for itching and rash.  Neurological: Negative for dizziness and headaches.  Psychiatric/Behavioral: Negative for depression. The patient is not nervous/anxious.         Objective: BP 112/67   Pulse 81   Temp (!) 97.5 F (36.4 C) (Temporal)   Ht 5' 2"  (1.575 m)   Wt 175 lb 6.4 oz (79.6 kg)   BMI 32.08 kg/m  Physical Exam Constitutional:      Appearance: Normal appearance.  HENT:     Head: Normocephalic and atraumatic.  Eyes:     Extraocular Movements: Extraocular movements intact.     Conjunctiva/sclera: Conjunctivae normal.  Cardiovascular:     Rate and Rhythm: Normal rate and regular rhythm.  Pulmonary:     Effort: Pulmonary effort is normal.     Breath sounds: Normal breath sounds.  Abdominal:     General: Bowel sounds are normal.     Palpations: Abdomen is soft.  Musculoskeletal:        General: No swelling. Normal range of motion.     Cervical back: Normal range of motion and neck supple.  Skin:    General: Skin is warm and dry.     Coloration: Skin is not jaundiced.  Neurological:     General: No focal deficit present.     Mental Status: She is alert and oriented to person, place, and time.  Psychiatric:        Mood and Affect: Mood normal.        Behavior: Behavior normal.      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  She has been on Entyvio now for nearly 4 years and states she feels as though she it is helping immensely. -Check CBC, CMP, inflammatory markers -Given history of vitamin deficiencies, will check vitamin D and B12 levels today  -Etiology of patient's worsening dysphagia  unclear.  At time of colonoscopy, Will also perform EGD to evaluate for peptic ulcer disease, esophagitis, gastritis, H. Pylori, duodenitis, or other. Will also evaluate for esophageal  stricture, Schatzki's ring, esophageal web or other. The risks including infection, bleed, or perforation as well as benefits, limitations, alternatives and imponderables have been reviewed with the patient. Potential for esophageal dilation, biopsy, etc. have also been reviewed.  Questions have been answered. All parties agreeable. - Continue on daily PPi therapy for chronic reflux -Further recommendations to follow   10/01/2019 12:52 PM   Disclaimer: This note was dictated with voice recognition software. Similar sounding words can inadvertently be transcribed and may not be corrected upon review.

## 2019-10-01 NOTE — Telephone Encounter (Signed)
PA approved via [facility] website for procedures. Auth# [id number] dates 10/15/2019-01/13/2020

## 2019-10-07 ENCOUNTER — Encounter (HOSPITAL_COMMUNITY)
Admission: RE | Admit: 2019-10-07 | Discharge: 2019-10-07 | Disposition: A | Discharge status: Home / Self Care | Payer: 59 | Source: Ambulatory Visit | Attending: Internal Medicine | Admitting: Internal Medicine

## 2019-10-07 ENCOUNTER — Other Ambulatory Visit: Payer: Self-pay

## 2019-10-12 ENCOUNTER — Other Ambulatory Visit: Payer: Self-pay | Admitting: Internal Medicine

## 2019-10-12 MED ORDER — HYDROCODONE-ACETAMINOPHEN 10-325 MG PO TABS: 1.0000 | ORAL_TABLET | Freq: Two times a day (BID) | ORAL | 0 refills | Status: DC | PRN

## 2019-10-14 ENCOUNTER — Other Ambulatory Visit: Payer: Self-pay

## 2019-10-14 ENCOUNTER — Other Ambulatory Visit (HOSPITAL_COMMUNITY)
Admission: RE | Admit: 2019-10-14 | Discharge: 2019-10-14 | Disposition: A | Discharge status: Home / Self Care | Payer: 59 | Source: Ambulatory Visit | Attending: Internal Medicine | Admitting: Internal Medicine

## 2019-10-14 DIAGNOSIS — Z01812 Encounter for preprocedural laboratory examination: Secondary | ICD-10-CM | POA: Diagnosis present

## 2019-10-14 DIAGNOSIS — Z20822 Contact with and (suspected) exposure to covid-19: Secondary | ICD-10-CM | POA: Diagnosis not present

## 2019-10-14 LAB — SARS CORONAVIRUS 2 (TAT 6-24 HRS): SARS Coronavirus 2: NEGATIVE

## 2019-10-15 ENCOUNTER — Other Ambulatory Visit: Payer: Self-pay

## 2019-10-15 ENCOUNTER — Ambulatory Visit (HOSPITAL_COMMUNITY)
Admission: RE | Admit: 2019-10-15 | Discharge: 2019-10-15 | Disposition: A | Discharge status: Home / Self Care | Payer: 59 | Attending: Internal Medicine | Admitting: Internal Medicine

## 2019-10-15 ENCOUNTER — Ambulatory Visit (HOSPITAL_COMMUNITY): Payer: 59 | Admitting: Anesthesiology

## 2019-10-15 ENCOUNTER — Encounter (HOSPITAL_COMMUNITY): Payer: Self-pay

## 2019-10-15 ENCOUNTER — Encounter (HOSPITAL_COMMUNITY)
Admission: RE | Disposition: A | Discharge status: Home / Self Care | Payer: Self-pay | Source: Home / Self Care | Attending: Internal Medicine

## 2019-10-15 DIAGNOSIS — K219 Gastro-esophageal reflux disease without esophagitis: Secondary | ICD-10-CM | POA: Insufficient documentation

## 2019-10-15 DIAGNOSIS — K635 Polyp of colon: Secondary | ICD-10-CM

## 2019-10-15 DIAGNOSIS — K2970 Gastritis, unspecified, without bleeding: Secondary | ICD-10-CM | POA: Diagnosis not present

## 2019-10-15 DIAGNOSIS — F419 Anxiety disorder, unspecified: Secondary | ICD-10-CM | POA: Insufficient documentation

## 2019-10-15 DIAGNOSIS — I10 Essential (primary) hypertension: Secondary | ICD-10-CM | POA: Diagnosis not present

## 2019-10-15 DIAGNOSIS — K648 Other hemorrhoids: Secondary | ICD-10-CM | POA: Insufficient documentation

## 2019-10-15 DIAGNOSIS — F319 Bipolar disorder, unspecified: Secondary | ICD-10-CM | POA: Diagnosis not present

## 2019-10-15 DIAGNOSIS — R1310 Dysphagia, unspecified: Secondary | ICD-10-CM

## 2019-10-15 DIAGNOSIS — K5080 Crohn's disease of both small and large intestine without complications: Secondary | ICD-10-CM | POA: Diagnosis not present

## 2019-10-15 DIAGNOSIS — Z87891 Personal history of nicotine dependence: Secondary | ICD-10-CM | POA: Diagnosis not present

## 2019-10-15 DIAGNOSIS — K644 Residual hemorrhoidal skin tags: Secondary | ICD-10-CM | POA: Diagnosis not present

## 2019-10-15 DIAGNOSIS — E538 Deficiency of other specified B group vitamins: Secondary | ICD-10-CM | POA: Insufficient documentation

## 2019-10-15 DIAGNOSIS — Z7951 Long term (current) use of inhaled steroids: Secondary | ICD-10-CM | POA: Diagnosis not present

## 2019-10-15 DIAGNOSIS — K222 Esophageal obstruction: Secondary | ICD-10-CM

## 2019-10-15 DIAGNOSIS — K2950 Unspecified chronic gastritis without bleeding: Secondary | ICD-10-CM | POA: Insufficient documentation

## 2019-10-15 DIAGNOSIS — Z79899 Other long term (current) drug therapy: Secondary | ICD-10-CM | POA: Insufficient documentation

## 2019-10-15 DIAGNOSIS — K5730 Diverticulosis of large intestine without perforation or abscess without bleeding: Secondary | ICD-10-CM | POA: Diagnosis not present

## 2019-10-15 DIAGNOSIS — K319 Disease of stomach and duodenum, unspecified: Secondary | ICD-10-CM | POA: Insufficient documentation

## 2019-10-15 HISTORY — PX: BIOPSY: SHX5522

## 2019-10-15 HISTORY — PX: ESOPHAGOGASTRODUODENOSCOPY (EGD) WITH PROPOFOL: SHX5813

## 2019-10-15 HISTORY — PX: BALLOON DILATION: SHX5330

## 2019-10-15 HISTORY — PX: COLONOSCOPY WITH PROPOFOL: SHX5780

## 2019-10-15 SURGERY — COLONOSCOPY WITH PROPOFOL
Anesthesia: General

## 2019-10-15 MED ORDER — CHLORHEXIDINE GLUCONATE CLOTH 2 % EX PADS: 6.0000 | MEDICATED_PAD | Freq: Once | CUTANEOUS | Status: DC

## 2019-10-15 MED ORDER — LIDOCAINE VISCOUS HCL 2 % MT SOLN
OROMUCOSAL | Status: AC
  Filled 2019-10-15: qty 15

## 2019-10-15 MED ORDER — GLYCOPYRROLATE 0.2 MG/ML IJ SOLN
0.2000 mg | Freq: Once | INTRAMUSCULAR | Status: AC
  Administered 2019-10-15: 0.2 mg via INTRAVENOUS

## 2019-10-15 MED ORDER — PROPOFOL 10 MG/ML IV BOLUS
INTRAVENOUS | Status: DC | PRN
  Administered 2019-10-15 (×2): 50 mg via INTRAVENOUS

## 2019-10-15 MED ORDER — STERILE WATER FOR IRRIGATION IR SOLN
Status: DC | PRN
  Administered 2019-10-15: 1.5 mL

## 2019-10-15 MED ORDER — LIDOCAINE HCL (CARDIAC) PF 100 MG/5ML IV SOSY
PREFILLED_SYRINGE | INTRAVENOUS | Status: DC | PRN
  Administered 2019-10-15: 20 mg via INTRAVENOUS

## 2019-10-15 MED ORDER — LACTATED RINGERS IV SOLN: INTRAVENOUS | Status: DC

## 2019-10-15 MED ORDER — PROPOFOL 500 MG/50ML IV EMUL
INTRAVENOUS | Status: DC | PRN
  Administered 2019-10-15: 125 ug/kg/min via INTRAVENOUS

## 2019-10-15 MED ORDER — LIDOCAINE VISCOUS HCL 2 % MT SOLN
15.0000 mL | Freq: Once | OROMUCOSAL | Status: AC
  Administered 2019-10-15: 15 mL via OROMUCOSAL

## 2019-10-15 MED ORDER — GLYCOPYRROLATE 0.2 MG/ML IJ SOLN
INTRAMUSCULAR | Status: AC
  Filled 2019-10-15: qty 1

## 2019-10-15 NOTE — Op Note (Signed)
[facility] Patient Name: [name] [name] Procedure Date: 10/15/2019 8:08 AM MRN: [id number] Date of Birth: Mar 06, 1970 Attending MD: Elon Alas. Abbey Chatters DO CSN: [id number] Age: 49 Admit Type: Outpatient Procedure:                Upper GI endoscopy Indications:              Dysphagia Providers:                Elon Alas. Abbey Chatters, DO, Janeece Riggers, RN, Lambert Mody, Casimer Bilis, Technician, Nelma Rothman, Technician Referring MD:              Medicines:                See the Anesthesia note for documentation of the                            administered medications Complications:            No immediate complications. Estimated Blood Loss:     Estimated blood loss was minimal. Procedure:                Pre-Anesthesia Assessment:                           - The anesthesia plan was to use monitored                            anesthesia care (MAC).                           After obtaining informed consent, the endoscope was                            passed under direct vision. Throughout the                            procedure, the patient's blood pressure, pulse, and                            oxygen saturations were monitored continuously. The                            GIF-H190 ([id number]) scope was introduced through the                            mouth, and advanced to the second part of duodenum.                            The upper GI endoscopy was accomplished without                            difficulty. The patient tolerated the procedure  well. Scope In: 8:21:26 AM Scope Out: 8:27:13 AM Total Procedure Duration: 0 hours 5 minutes 47 seconds  Findings:      One benign-appearing, intrinsic moderate stenosis was found in the lower       third of the esophagus. The stenosis was traversed. A TTS dilator was       passed through the scope. Dilation with an 18-19-20 mm balloon dilator        was performed to 18 mm. The dilation site was examined and showed mild       mucosal disruption and moderate improvement in luminal narrowing.      Diffuse mild inflammation characterized by erythema was found in the       entire examined stomach. Biopsies were taken with a cold forceps for       Helicobacter pylori testing.      The duodenal bulb, first portion of the duodenum and second portion of       the duodenum were normal. Impression:               - Benign-appearing esophageal stenosis. Dilated.                           - Gastritis. Biopsied.                           - Normal duodenal bulb, first portion of the                            duodenum and second portion of the duodenum. Moderate Sedation:      Per Anesthesia Care Recommendation:           - Patient has a contact number available for                            emergencies. The signs and symptoms of potential                            delayed complications were discussed with the                            patient. Return to normal activities tomorrow.                            Written discharge instructions were provided to the                            patient.                           - Resume previous diet.                           - Continue present medications.                           - Await pathology results.                           - Repeat upper endoscopy  PRN for retreatment.                           - Return to GI clinic in 6-8 weeks with Dr. Abbey Chatters. Procedure Code(s):        --- Professional ---                           [phone], Esophagogastroduodenoscopy, flexible,                            transoral; with transendoscopic balloon dilation of                            esophagus (less than 30 mm diameter)                           43239, 59, Esophagogastroduodenoscopy, flexible,                            transoral; with biopsy, single or multiple Diagnosis Code(s):        --- Professional  ---                           K22.2, Esophageal obstruction                           K29.70, Gastritis, unspecified, without bleeding                           R13.10, Dysphagia, unspecified CPT copyright 2019 American Medical Association. All rights reserved. The codes documented in this report are preliminary and upon coder review may  be revised to meet current compliance requirements. Elon Alas. Abbey Chatters, [place] Abbey Chatters, DO 10/15/2019 8:30:28 AM This report has been signed electronically. Number of Addenda: 0

## 2019-10-15 NOTE — Anesthesia Postprocedure Evaluation (Signed)
Anesthesia Post Note  Patient: [name] [name]  Procedure(s) Performed: COLONOSCOPY WITH PROPOFOL (N/A ) ESOPHAGOGASTRODUODENOSCOPY (EGD) WITH PROPOFOL (N/A ) BALLOON DILATION (N/A ) BIOPSY  Patient location during evaluation: Endoscopy Anesthesia Type: General and MAC Level of consciousness: awake and alert Pain management: pain level controlled Vital Signs Assessment: post-procedure vital signs reviewed and stable Respiratory status: spontaneous breathing Cardiovascular status: stable Postop Assessment: no apparent nausea or vomiting Anesthetic complications: no   No complications documented.   Last Vitals:  Vitals:   10/15/19 0750 10/15/19 0854  BP: 125/66 112/74  Pulse: 83 99  Resp: 15 14  Temp: 36.8 C 36.7 C  SpO2: 98% 99%    Last Pain:  Vitals:   10/15/19 0854  TempSrc: Oral  PainSc: 0-No pain                 Everette Rank

## 2019-10-15 NOTE — Transfer of Care (Signed)
Immediate Anesthesia Transfer of Care Note  Patient: HOLIDAY MCMENAMIN  Procedure(s) Performed: COLONOSCOPY WITH PROPOFOL (N/A ) ESOPHAGOGASTRODUODENOSCOPY (EGD) WITH PROPOFOL (N/A ) BALLOON DILATION (N/A ) BIOPSY  Patient Location: Endoscopy Unit  Anesthesia Type:MAC and General  Level of Consciousness: awake, alert , oriented and patient cooperative  Airway & Oxygen Therapy: Patient Spontanous Breathing  Post-op Assessment: Report given to RN and Post -op Vital signs reviewed and stable  Post vital signs: Reviewed and stable  Last Vitals:  Vitals Value Taken Time  BP    Temp    Pulse    Resp    SpO2      Last Pain:  Vitals:   10/15/19 0816  TempSrc:   PainSc: 0-No pain      Patients Stated Pain Goal: 7 (04/13/34 1224)  Complications: No complications documented.

## 2019-10-15 NOTE — Discharge Instructions (Addendum)
EGD Discharge instructions Please read the instructions outlined below and refer to this sheet in the next few weeks. These discharge instructions provide you with general information on caring for yourself after you leave the hospital. Your doctor may also give you specific instructions. While your treatment has been planned according to the most current medical practices available, unavoidable complications occasionally occur. If you have any problems or questions after discharge, please call your doctor. ACTIVITY  You may resume your regular activity but move at a slower pace for the next 24 hours.   Take frequent rest periods for the next 24 hours.   Walking will help expel (get rid of) the air and reduce the bloated feeling in your abdomen.   No driving for 24 hours (because of the anesthesia (medicine) used during the test).   You may shower.   Do not sign any important legal documents or operate any machinery for 24 hours (because of the anesthesia used during the test).  NUTRITION  Drink plenty of fluids.   You may resume your normal diet.   Begin with a light meal and progress to your normal diet.   Avoid alcoholic beverages for 24 hours or as instructed by your caregiver.  MEDICATIONS  You may resume your normal medications unless your caregiver tells you otherwise.  WHAT YOU CAN EXPECT TODAY  You may experience abdominal discomfort such as a feeling of fullness or "gas" pains.  FOLLOW-UP  Your doctor will discuss the results of your test with you.  SEEK IMMEDIATE MEDICAL ATTENTION IF ANY OF THE FOLLOWING OCCUR:  Excessive nausea (feeling sick to your stomach) and/or vomiting.   Severe abdominal pain and distention (swelling).   Trouble swallowing.   Temperature over 101 F (37.8 C).   Rectal bleeding or vomiting of blood.     Colonoscopy Discharge Instructions  Read the instructions outlined below and refer to this sheet in the next few weeks. These  discharge instructions provide you with general information on caring for yourself after you leave the hospital. Your doctor may also give you specific instructions. While your treatment has been planned according to the most current medical practices available, unavoidable complications occasionally occur.   ACTIVITY  You may resume your regular activity, but move at a slower pace for the next 24 hours.   Take frequent rest periods for the next 24 hours.   Walking will help get rid of the air and reduce the bloated feeling in your belly (abdomen).   No driving for 24 hours (because of the medicine (anesthesia) used during the test).    Do not sign any important legal documents or operate any machinery for 24 hours (because of the anesthesia used during the test).  NUTRITION  Drink plenty of fluids.   You may resume your normal diet as instructed by your doctor.   Begin with a light meal and progress to your normal diet. Heavy or fried foods are harder to digest and may make you feel sick to your stomach (nauseated).   Avoid alcoholic beverages for 24 hours or as instructed.  MEDICATIONS  You may resume your normal medications unless your doctor tells you otherwise.  WHAT YOU CAN EXPECT TODAY  Some feelings of bloating in the abdomen.   Passage of more gas than usual.   Spotting of blood in your stool or on the toilet paper.  IF YOU HAD POLYPS REMOVED DURING THE COLONOSCOPY:  No aspirin products for 7 days or as instructed.  No alcohol for 7 days or as instructed.   Eat a soft diet for the next 24 hours.  FINDING OUT THE RESULTS OF YOUR TEST Not all test results are available during your visit. If your test results are not back during the visit, make an appointment with your caregiver to find out the results. Do not assume everything is normal if you have not heard from your caregiver or the medical facility. It is important for you to follow up on all of your test results.    SEEK IMMEDIATE MEDICAL ATTENTION IF:  You have more than a spotting of blood in your stool.   Your belly is swollen (abdominal distention).   You are nauseated or vomiting.   You have a temperature over 101.   You have abdominal pain or discomfort that is severe or gets worse throughout the day.    Colon Polyps  Polyps are tissue growths inside the body. Polyps can grow in many places, including the large intestine (colon). A polyp may be a round bump or a mushroom-shaped growth. You could have one polyp or several. Most colon polyps are noncancerous (benign). However, some colon polyps can become cancerous over time. Finding and removing the polyps early can help prevent this. What are the causes? The exact cause of colon polyps is not known. What increases the risk? You are more likely to develop this condition if you:  Have a family history of colon cancer or colon polyps.  Are older than 15 or older than 45 if you are African American.  Have inflammatory bowel disease, such as ulcerative colitis or Crohn's disease.  Have certain hereditary conditions, such as: ? Familial adenomatous polyposis. ? Lynch syndrome. ? Turcot syndrome. ? Peutz-Jeghers syndrome.  Are overweight.  Smoke cigarettes.  Do not get enough exercise.  Drink too much alcohol.  Eat a diet that is high in fat and red meat and low in fiber.  Had childhood cancer that was treated with abdominal radiation. What are the signs or symptoms? Most polyps do not cause symptoms. If you have symptoms, they may include:  Blood coming from your rectum when having a bowel movement.  Blood in your stool. The stool may look dark red or black.  Abdominal pain.  A change in bowel habits, such as constipation or diarrhea. How is this diagnosed? This condition is diagnosed with a colonoscopy. This is a procedure in which a lighted, flexible scope is inserted into the anus and then passed into the colon to  examine the area. Polyps are sometimes found when a colonoscopy is done as part of routine cancer screening tests. How is this treated? Treatment for this condition involves removing any polyps that are found. Most polyps can be removed during a colonoscopy. Those polyps will then be tested for cancer. Additional treatment may be needed depending on the results of testing. Follow these instructions at home: Lifestyle  Maintain a healthy weight, or lose weight if recommended by your health care provider.  Exercise every day or as told by your health care provider.  Do not use any products that contain nicotine or tobacco, such as cigarettes and e-cigarettes. If you need help quitting, ask your health care provider.  If you drink alcohol, limit how much you have: ? 0-1 drink a day for women. ? 0-2 drinks a day for men.  Be aware of how much alcohol is in your drink. In the [place] one drink equals one 12 oz bottle of  beer (355 mL), one 5 oz glass of wine (148 mL), or one 1 oz shot of hard liquor (44 mL). Eating and drinking   Eat foods that are high in fiber, such as fruits, vegetables, and whole grains.  Eat foods that are high in calcium and vitamin D, such as milk, cheese, yogurt, eggs, liver, fish, and broccoli.  Limit foods that are high in fat, such as fried foods and desserts.  Limit the amount of red meat and processed meat you eat, such as hot dogs, sausage, bacon, and lunch meats. General instructions  Keep all follow-up visits as told by your health care provider. This is important. ? This includes having regularly scheduled colonoscopies. ? Talk to your health care provider about when you need a colonoscopy. Contact a health care provider if:  You have new or worsening bleeding during a bowel movement.  You have new or increased blood in your stool.  You have a change in bowel habits.  You lose weight for no known reason. Summary  Polyps are tissue growths inside  the body. Polyps can grow in many places, including the colon.  Most colon polyps are noncancerous (benign), but some can become cancerous over time.  This condition is diagnosed with a colonoscopy.  Treatment for this condition involves removing any polyps that are found. Most polyps can be removed during a colonoscopy. This information is not intended to replace advice given to you by your health care provider. Make sure you discuss any questions you have with your health care provider. Document Revised: 05/23/2017 Document Reviewed: 05/23/2017 Elsevier Patient Education  [place] EGD showed an esophageal stricture which I dilated with a balloon.  Hopefully this helps with your swallowing.  We may need to repeat EGD in the future for retreatment.  Continue on daily Protonix.  You also had a small amount of inflammation in your stomach which I biopsied to rule out H. pylori infection.  Await your pathology results (my office will contact you next week).  Your colonoscopy was relatively unremarkable.  I did remove one small polyp.  I did not see any active inflammation in your entire colon or ileum.  I believe the [name] is working well.  We will discuss further management on follow-up visit with me in 6 to 8 weeks.  I did take biopsies throughout your colon to make sure there was no underlying inflammation.  I hope you have a great rest of your week!  Elon Alas. Abbey Chatters, D.O. Gastroenterology and Hepatology [facility]

## 2019-10-15 NOTE — Anesthesia Preprocedure Evaluation (Signed)
Anesthesia Evaluation  Patient identified by MRN, date of birth, ID band Patient awake    Reviewed: Allergy & Precautions, H&P , NPO status , Patient's Chart, lab work & pertinent test results, reviewed documented beta blocker date and time   Airway Mallampati: II  TM Distance: >3 FB Neck ROM: full    Dental no notable dental hx.    Pulmonary neg pulmonary ROS, pneumonia, resolved, former smoker,    Pulmonary exam normal breath sounds clear to auscultation       Cardiovascular Exercise Tolerance: Good hypertension, negative cardio ROS  + Valvular Problems/Murmurs  Rhythm:regular Rate:Normal     Neuro/Psych  Headaches, PSYCHIATRIC DISORDERS Anxiety Depression Bipolar Disorder  Neuromuscular disease negative neurological ROS  negative psych ROS   GI/Hepatic negative GI ROS, Neg liver ROS, GERD  Medicated,  Endo/Other  negative endocrine ROS  Renal/GU negative Renal ROS  negative genitourinary   Musculoskeletal   Abdominal   Peds  Hematology negative hematology ROS (+)   Anesthesia Other Findings   Reproductive/Obstetrics negative OB ROS                             Anesthesia Physical Anesthesia Plan  ASA: III  Anesthesia Plan: General   Post-op Pain Management:    Induction:   PONV Risk Score and Plan: Propofol infusion  Airway Management Planned:   Additional Equipment:   Intra-op Plan:   Post-operative Plan:   Informed Consent: I have reviewed the patients History and Physical, chart, labs and discussed the procedure including the risks, benefits and alternatives for the proposed anesthesia with the patient or authorized representative who has indicated his/her understanding and acceptance.     Dental Advisory Given  Plan Discussed with: CRNA  Anesthesia Plan Comments:         Anesthesia Quick Evaluation

## 2019-10-15 NOTE — Interval H&P Note (Signed)
History and Physical Interval Note:  10/15/2019 7:53 AM  [name] [name]  has presented today for surgery, with the diagnosis of crohns, dysphagia, rectovaginal fistula, gerd.  The various methods of treatment have been discussed with the patient and family. After consideration of risks, benefits and other options for treatment, the patient has consented to  Procedure(s) with comments: COLONOSCOPY WITH PROPOFOL (N/A) - 10:45am ESOPHAGOGASTRODUODENOSCOPY (EGD) WITH PROPOFOL (N/A) BALLOON DILATION (N/A) as a surgical intervention.  The patient's history has been reviewed, patient examined, no change in status, stable for surgery.  I have reviewed the patient's chart and labs.  Questions were answered to the patient's satisfaction.     Eloise Harman

## 2019-10-15 NOTE — Op Note (Signed)
[facility] Patient Name: [name] [name] Procedure Date: 10/15/2019 8:31 AM MRN: [id number] Date of Birth: Nov 27, 1970 Attending MD: Elon Alas. Abbey Chatters DO CSN: [id number] Age: 49 Admit Type: Outpatient Procedure:                Colonoscopy Indications:              Disease activity assessment of Crohn's disease of                            the small bowel and colon, Assess therapeutic                            response to therapy of Crohn's disease of the small                            bowel and colon Providers:                Elon Alas. Abbey Chatters, DO, Janeece Riggers, RN, Lambert Mody, Casimer Bilis, Technician, Nelma Rothman, Technician Referring MD:              Medicines:                See the Anesthesia note for documentation of the                            administered medications Complications:            No immediate complications. Estimated Blood Loss:     Estimated blood loss was minimal. Procedure:                Pre-Anesthesia Assessment:                           - The anesthesia plan was to use monitored                            anesthesia care (MAC).                           After obtaining informed consent, the colonoscope                            was passed under direct vision. Throughout the                            procedure, the patient's blood pressure, pulse, and                            oxygen saturations were monitored continuously. The                            PCF-H190DL ([id number] scope was introduced through  the anus and advanced to the the terminal ileum,                            with identification of the appendiceal orifice and                            IC valve. The colonoscopy was performed without                            difficulty. The patient tolerated the procedure                            well. The quality of the bowel preparation was                             evaluated using the BBPS [facility] Bowel Preparation                            Scale) with scores of: Right Colon = 2 (minor                            amount of residual staining, small fragments of                            stool and/or opaque liquid, but mucosa seen well),                            Transverse Colon = 3 (entire mucosa seen well with                            no residual staining, small fragments of stool or                            opaque liquid) and Left Colon = 3 (entire mucosa                            seen well with no residual staining, small                            fragments of stool or opaque liquid). The total                            BBPS score equals 8. The quality of the bowel                            preparation was good. Scope In: 8:31:43 AM Scope Out: 8:44:24 AM Scope Withdrawal Time: 0 hours 10 minutes 46 seconds  Total Procedure Duration: 0 hours 12 minutes 41 seconds  Findings:      Hemorrhoids were found on perianal exam.      Non-bleeding internal hemorrhoids were found during endoscopy.      A few small-mouthed diverticula were found in the sigmoid colon.      The  terminal ileum appeared normal. Biopsies were taken with a cold       forceps for histology.      Biopsies were taken with a cold forceps in the entire colon for       histology.      A 2 mm polyp was found in the transverse colon. The polyp was sessile.       The polyp was removed with a cold biopsy forceps. Resection and       retrieval were complete.      There is no endoscopic evidence of erythema or inflammation in the       entire colon. Impression:               - Hemorrhoids found on perianal exam.                           - Non-bleeding internal hemorrhoids.                           - Diverticulosis in the sigmoid colon.                           - The examined portion of the ileum was normal.                            Biopsied.                            - One 2 mm polyp in the transverse colon, removed                            with a cold biopsy forceps. Resected and retrieved.                           - Biopsies were taken with a cold forceps for                            histology in the entire colon. Moderate Sedation:      Per Anesthesia Care Recommendation:           - Patient has a contact number available for                            emergencies. The signs and symptoms of potential                            delayed complications were discussed with the                            patient. Return to normal activities tomorrow.                            Written discharge instructions were provided to the                            patient.                           -  Resume previous diet.                           - Continue present medications.                           - Await pathology results.                           - Repeat colonoscopy at appointment to be scheduled                            for surveillance based on pathology results.                           - Return to GI clinic in 6-8 weeks with Dr. Abbey Chatters. Procedure Code(s):        --- Professional ---                           [phone], Colonoscopy, flexible; with biopsy, single                            or multiple Diagnosis Code(s):        --- Professional ---                           K64.8, Other hemorrhoids                           K63.5, Polyp of colon                           K50.80, Crohn's disease of both small and large                            intestine without complications                           K57.30, Diverticulosis of large intestine without                            perforation or abscess without bleeding CPT copyright 2019 American Medical Association. All rights reserved. The codes documented in this report are preliminary and upon coder review may  be revised to meet current compliance requirements. Elon Alas.  Abbey Chatters, [place] Abbey Chatters, DO 10/15/2019 8:50:58 AM This report has been signed electronically. Number of Addenda: 0

## 2019-10-16 ENCOUNTER — Ambulatory Visit: Payer: 59 | Admitting: Gastroenterology

## 2019-10-16 ENCOUNTER — Other Ambulatory Visit: Payer: Self-pay

## 2019-10-16 LAB — SURGICAL PATHOLOGY

## 2019-10-19 ENCOUNTER — Encounter (HOSPITAL_COMMUNITY): Payer: Self-pay | Admitting: Internal Medicine

## 2019-10-27 ENCOUNTER — Ambulatory Visit: Payer: 59 | Admitting: Gastroenterology

## 2019-11-01 ENCOUNTER — Other Ambulatory Visit: Payer: Self-pay | Admitting: Obstetrics and Gynecology

## 2019-11-02 ENCOUNTER — Other Ambulatory Visit: Payer: Self-pay

## 2019-11-02 ENCOUNTER — Encounter: Payer: Self-pay | Admitting: Internal Medicine

## 2019-11-02 ENCOUNTER — Ambulatory Visit (INDEPENDENT_AMBULATORY_CARE_PROVIDER_SITE_OTHER): Payer: 59 | Admitting: Internal Medicine

## 2019-11-02 DIAGNOSIS — E538 Deficiency of other specified B group vitamins: Secondary | ICD-10-CM

## 2019-11-02 DIAGNOSIS — G8929 Other chronic pain: Secondary | ICD-10-CM

## 2019-11-02 DIAGNOSIS — I10 Essential (primary) hypertension: Secondary | ICD-10-CM | POA: Diagnosis not present

## 2019-11-02 DIAGNOSIS — E559 Vitamin D deficiency, unspecified: Secondary | ICD-10-CM | POA: Diagnosis not present

## 2019-11-02 DIAGNOSIS — K50013 Crohn's disease of small intestine with fistula: Secondary | ICD-10-CM | POA: Diagnosis not present

## 2019-11-02 DIAGNOSIS — M5440 Lumbago with sciatica, unspecified side: Secondary | ICD-10-CM

## 2019-11-02 MED ORDER — HYDROCODONE-ACETAMINOPHEN 10-325 MG PO TABS: 1.0000 | ORAL_TABLET | Freq: Two times a day (BID) | ORAL | 0 refills | Status: DC | PRN

## 2019-11-02 MED ORDER — HYDROCODONE-ACETAMINOPHEN 10-325 MG PO TABS
1.0000 | ORAL_TABLET | Freq: Two times a day (BID) | ORAL | 0 refills | Status: DC | PRN
Start: 2019-11-02 — End: 2020-02-01

## 2019-11-02 NOTE — Assessment & Plan Note (Signed)
Wt Readings from Last 3 Encounters:  11/02/19 173 lb (78.5 kg)  10/15/19 175 lb 6.4 oz (79.6 kg)  10/01/19 175 lb 6.4 oz (79.6 kg)

## 2019-11-02 NOTE — Assessment & Plan Note (Signed)
Norco bid  Potential benefits of a long term opioids use as well as potential risks (i.e. addiction risk, apnea etc) and complications (i.e. Somnolence, constipation and others) were explained to the patient and were aknowledged.

## 2019-11-02 NOTE — Assessment & Plan Note (Signed)
Vit D 

## 2019-11-02 NOTE — Assessment & Plan Note (Addendum)
Pt just had a colonoscopy Entyvio

## 2019-11-02 NOTE — Assessment & Plan Note (Signed)
On B12 

## 2019-11-02 NOTE — Assessment & Plan Note (Signed)
Lotensin HCT

## 2019-11-02 NOTE — Telephone Encounter (Signed)
NETA 5 mg rx renewed.

## 2019-11-02 NOTE — Progress Notes (Signed)
Subjective:  Patient ID: [name] [name], female    DOB: 09-25-1970  Age: 49 y.o. MRN: [id number]  CC: No chief complaint on file.   HPI JOCILYNN GRADE presents for Crohn's, anxiety, LBP f/u  Outpatient Medications Prior to Visit  Medication Sig Dispense Refill  . albuterol (VENTOLIN HFA) 108 (90 Base) MCG/ACT inhaler Inhale 1-2 puffs into the lungs every 6 (six) hours as needed for wheezing or shortness of breath.    . ALPRAZolam (XANAX) 1 MG tablet Take 1 mg by mouth 2 (two) times daily.    . benazepril-hydrochlorthiazide (LOTENSIN HCT) 20-12.5 MG tablet Take 1 tablet by mouth daily. (Patient taking differently: Take 0.5 tablets by mouth daily. ) 90 tablet 1  . BREO ELLIPTA 100-25 MCG/INH AEPB Inhale 1 puff into the lungs daily.     . Cholecalciferol (VITAMIN D3) 250 MCG (10000 UT) capsule Take 30,000 Units by mouth once a week.    . cyanocobalamin (,VITAMIN B-12,) 1000 MCG/ML injection INJECT 1ML INTO THE SKIN EVERY 14 DAYS (Patient taking differently: Inject 1,000 mcg into the muscle every 14 (fourteen) days. ) 6 mL 11  . HYDROcodone-acetaminophen (NORCO) 10-325 MG tablet Take 1 tablet by mouth 2 (two) times daily as needed for severe pain. 60 tablet 0  . lamoTRIgine (LAMICTAL) 200 MG tablet Take 200 mg by mouth daily.     . norethindrone (AYGESTIN) 5 MG tablet TAKE 1 TABLET BY MOUTH EVERY DAY (Patient taking differently: Take 5 mg by mouth daily. ) 90 tablet 0  . pantoprazole (PROTONIX) 40 MG tablet TAKE 1 TABLET BY MOUTH 30MIN PRIOR TO FIRST MEAL (Patient taking differently: Take 40 mg by mouth daily before breakfast. ) 90 tablet 3  . potassium chloride (KLOR-CON) 10 MEQ tablet TAKE 1 TABLET BY MOUTH EVERY DAY (Patient taking differently: Take 10 mEq by mouth daily. ) 90 tablet 3  . sertraline (ZOLOFT) 100 MG tablet Take 200 mg by mouth daily.     . SYRINGE-NEEDLE, DISP, 3 ML (B-D SYRINGE/NEEDLE 3CC/25GX5/8) 25G X 5/8" 3 ML MISC Use to administer B12 injection every 14 days 24 each 0   . vedolizumab (ENTYVIO) 300 MG injection Inject 300 mg into the vein every 8 (eight) weeks.      No facility-administered medications prior to visit.    ROS: Review of Systems  Constitutional: Negative for activity change, appetite change, chills, fatigue and unexpected weight change.  HENT: Negative for congestion, mouth sores and sinus pressure.   Eyes: Negative for visual disturbance.  Respiratory: Negative for cough and chest tightness.   Gastrointestinal: Negative for abdominal pain and nausea.  Genitourinary: Negative for difficulty urinating, frequency and vaginal pain.  Musculoskeletal: Positive for back pain. Negative for gait problem.  Skin: Negative for pallor and rash.  Neurological: Negative for dizziness, tremors, weakness, numbness and headaches.  Psychiatric/Behavioral: Negative for confusion, sleep disturbance and suicidal ideas.    Objective:  BP 106/60 (BP Location: Left Arm, Patient Position: Sitting, Cuff Size: Large)   Pulse 81   Temp (!) 97.5 F (36.4 C) (Oral)   Ht 5' 2"  (1.575 m)   Wt 173 lb (78.5 kg)   LMP 03/03/2014 Comment: still bleeding  SpO2 99%   BMI 31.64 kg/m   BP Readings from Last 3 Encounters:  11/02/19 106/60  10/15/19 112/74  10/01/19 112/67    Wt Readings from Last 3 Encounters:  11/02/19 173 lb (78.5 kg)  10/15/19 175 lb 6.4 oz (79.6 kg)  10/01/19 175 lb 6.4  oz (79.6 kg)    Physical Exam Constitutional:      General: She is not in acute distress.    Appearance: She is well-developed. She is obese.  HENT:     Head: Normocephalic.     Right Ear: External ear normal.     Left Ear: External ear normal.     Nose: Nose normal.  Eyes:     General:        Right eye: No discharge.        Left eye: No discharge.     Conjunctiva/sclera: Conjunctivae normal.     Pupils: Pupils are equal, round, and reactive to light.  Neck:     Thyroid: No thyromegaly.     Vascular: No JVD.     Trachea: No tracheal deviation.    Cardiovascular:     Rate and Rhythm: Normal rate and regular rhythm.     Heart sounds: Normal heart sounds.  Pulmonary:     Effort: No respiratory distress.     Breath sounds: No stridor. No wheezing.  Abdominal:     General: Bowel sounds are normal. There is no distension.     Palpations: Abdomen is soft. There is no mass.     Tenderness: There is no abdominal tenderness. There is no guarding or rebound.  Musculoskeletal:        General: Tenderness present.     Cervical back: Normal range of motion and neck supple.  Lymphadenopathy:     Cervical: No cervical adenopathy.  Skin:    Findings: No erythema or rash.  Neurological:     Mental Status: She is oriented to person, place, and time.     Cranial Nerves: No cranial nerve deficit.     Motor: No abnormal muscle tone.     Coordination: Coordination normal.     Deep Tendon Reflexes: Reflexes normal.  Psychiatric:        Behavior: Behavior normal.        Thought Content: Thought content normal.        Judgment: Judgment normal.   LS w/pain  Lab Results  Component Value Date   WBC 5.3 10/01/2019   HGB 11.6 (L) 10/01/2019   HCT 34.4 (L) 10/01/2019   PLT 116 (L) 10/01/2019   GLUCOSE 113 (H) 10/01/2019   CHOL 168 08/30/2014   TRIG 99.0 08/30/2014   HDL 27.40 (L) 08/30/2014   LDLDIRECT 171.9 05/01/2011   LDLCALC 121 (H) 08/30/2014   ALT 15 10/01/2019   AST 12 (L) 10/01/2019   NA 138 10/01/2019   K 3.8 10/01/2019   CL 104 10/01/2019   CREATININE 1.39 (H) 10/01/2019   BUN 16 10/01/2019   CO2 23 10/01/2019   TSH 0.585 05/28/2018    No results found.  Assessment & Plan:    [name], MD

## 2019-11-05 ENCOUNTER — Telehealth: Payer: Self-pay | Admitting: *Deleted

## 2019-11-05 ENCOUNTER — Ambulatory Visit (INDEPENDENT_AMBULATORY_CARE_PROVIDER_SITE_OTHER): Payer: 59 | Admitting: Internal Medicine

## 2019-11-05 ENCOUNTER — Encounter: Payer: Self-pay | Admitting: Internal Medicine

## 2019-11-05 DIAGNOSIS — K50013 Crohn's disease of small intestine with fistula: Secondary | ICD-10-CM

## 2019-11-05 DIAGNOSIS — D369 Benign neoplasm, unspecified site: Secondary | ICD-10-CM | POA: Insufficient documentation

## 2019-11-05 DIAGNOSIS — K219 Gastro-esophageal reflux disease without esophagitis: Secondary | ICD-10-CM | POA: Diagnosis not present

## 2019-11-05 DIAGNOSIS — N824 Other female intestinal-genital tract fistulae: Secondary | ICD-10-CM

## 2019-11-05 NOTE — Telephone Encounter (Signed)
[name] [name], you are scheduled for a virtual visit with your provider today.  Just as we do with appointments in the office, we must obtain your consent to participate.  Your consent will be active for this visit and any virtual visit you may have with one of our providers in the next 365 days.  If you have a MyChart account, I can also send a copy of this consent to you electronically.  All virtual visits are billed to your insurance company just like a traditional visit in the office.  As this is a virtual visit, video technology does not allow for your provider to perform a traditional examination.  This may limit your provider's ability to fully assess your condition.  If your provider identifies any concerns that need to be evaluated in person or the need to arrange testing such as labs, EKG, etc, we will make arrangements to do so.  Although advances in technology are sophisticated, we cannot ensure that it will always work on either your end or our end.  If the connection with a video visit is poor, we may have to switch to a telephone visit.  With either a video or telephone visit, we are not always able to ensure that we have a secure connection.   I need to obtain your verbal consent now.   Are you willing to proceed with your visit today?

## 2019-11-05 NOTE — Patient Instructions (Signed)
Your Crohn's disease appears to be well controlled at this time.  As we discussed on the phone, I think it would be okay to trial spreading your Entyvio to every 12 weeks to try and give your fistula time to heal.  We do run the risk of a Crohn's flare and or antibodies to [facility], so it is important that you call the office at the first sign of any flare including abdominal pain, diarrhea, or blood in your stool.  Follow-up with GI in 3 months or sooner if needed.  At [facility] we value your feedback. You may receive a survey about your visit today. Please share your experience as we strive to create trusting relationships with our patients to provide genuine, compassionate, quality care.  We appreciate your understanding and patience as we review any laboratory studies, imaging, and other diagnostic tests that are ordered as we care for you. Our office policy is 5 business days for review of these results, and any emergent or urgent results are addressed in a timely manner for your best interest. If you do not hear from our office in 1 week, please contact us.   We also encourage the use of MyChart, which contains your medical information for your review as well. If you are not enrolled in this feature, an access code is on this after visit summary for your convenience. Thank you for allowing [place] to be involved in your care.  It was great to see you today!  I hope you have a great rest of your summer!!    Elon Alas. Abbey Chatters, D.O. Gastroenterology and Hepatology [facility]

## 2019-11-05 NOTE — Telephone Encounter (Signed)
Pt consented to a telephone visit today.

## 2019-11-05 NOTE — Progress Notes (Signed)
Referring Provider: Cassandria Anger, MD Primary Care Physician:  Cassandria Anger, MD Primary GI:  Dr.   NOTE: Service was provided via telemedicine and was requested by the patient due to COVID-19 pandemic.  Patient Location: Home  Provider Location: [place] office  Reason for Phone Visit: Crohn's  Total time (minutes) spent on medical discussion: 21-30 min   I discussed the limitations, risks, security and privacy concerns of performing an evaluation and management service by telephone and the availability of in person appointments. I also discussed with the patient that there may be a patient responsible charge related to this service. The patient expressed understanding and agreed to proceed.  Chief Complaint  Patient presents with  . 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  She establish care with [place] in January 2017.  She has previously failed Imuran, Humira, Asacol.  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  She was reportedly in a research trial at [facility] for stem cell research at one point.    According to gynecology note dated 05/09/2018 noted a draining rectovaginal fistula with left buttock fluid collection.  Cultures was sent and found to be staph aureus.  [name] was held during this time which she has been maintained on since 2017.  CT pelvis in June showed evidence of rectovaginal fistula unchanged from 2018.  Patient was continued on Entyvio with most recent CT in December 10, 2018 which showed a nodular density posterior to the rectum likely representing scarring related to prior inflammatory changes collection.  No drainable fluid identified  Patient was hospitalized in  October with sepsis due to E. coli bacteremia related to UTI as well as multifocal pneumonia and aspiration.  She continues to be maintained on Entyvio every 8 weeks.  From a GI standpoint she actually appears to be doing pretty well.  Notes normal bowel movements.  No melena or hematochezia.  She feels as though her rectovaginal fistula is healing for the most part.    She recently underwent EGD and colonoscopy on 10/15/2019.  Her colon looked remarkably healthy with negative biopsies for any inflammation throughout her entire colon.  Her terminal ileum also looked healthy with negative biopsies as well.  She did have one small tubular adenoma removed. Her EGD showed a Schatzki's ring in her distal esophagus which was dilated with an 18 mm balloon.  She also had gastritis.  Upper endoscopy biopsies were relatively unremarkable besides chronic gastritis.  EGD and colonoscopy in February 2017 showed inflammation and ulceration in the cecum involving the IC valve which was unable to be intubated with colonoscope.  Also noted severe proctitis as well.  EGD showed erosive gastritis duodenitis.  Biopsies from cecal right colon with chronic active colitis and chronic inactive colitis in the rectum.  Negative for dysplasia.   Past Medical History:  Diagnosis Date  . Anxiety   . Arthritis    Dr. Eddie Dibbles  . Bartholin cyst 2008   vag.  [name] BIPOLAR AFFECTIVE DISORDER 04/14/2007  . Crohn's ON ENTYVIO SINCE FEB [id number]   COMPLICATED BY RECTOVAGINAL FISTULA. SJS WITH REMICADE. FAILED HUMIRA.  [name] Depression   . Elevated glucose 2010  . GERD (gastroesophageal reflux disease)   .  Hypertension   . Kidney stone   . LBP (low back pain)    Dr. Mina Marble  . Perianal abscess 2009  . Vitamin B12 deficiency     Past Surgical History:  Procedure Laterality Date  . BALLOON DILATION N/A 10/15/2019   Procedure: BALLOON DILATION;  Surgeon: Eloise Harman, DO;  [facility];  Service: Endoscopy;  Laterality:  N/A;  . BIOPSY  10/15/2019   Procedure: BIOPSY;  Surgeon: Eloise Harman, DO;  [facility];  Service: Endoscopy;;  . COLONOSCOPY  09/2008   Dr. Derrill Kay: ulcers, edema, possible fistula openings all seen in the distal 3 cm of anus/anal canal. 1cm pseudopolyp. rest of colon and TI normal. rectal biopsy with mild chronic active colitis. ileum bx normal.  . COLONOSCOPY WITH PROPOFOL N/A 03/29/2015   SLF: Severe proctocolitis limitied to cecum and rectum. Limited exam of the colon mucosa and anal canal.   . COLONOSCOPY WITH PROPOFOL N/A 10/15/2019   Procedure: COLONOSCOPY WITH PROPOFOL;  Surgeon: Eloise Harman, DO;  [facility];  Service: Endoscopy;  Laterality: N/A;  10:45am  . ELBOW SURGERY     left  . ESOPHAGOGASTRODUODENOSCOPY (EGD) WITH PROPOFOL N/A 03/29/2015   SLF: 1. Erosive gastritis duodentitis.   [name] ESOPHAGOGASTRODUODENOSCOPY (EGD) WITH PROPOFOL N/A 10/15/2019   Procedure: ESOPHAGOGASTRODUODENOSCOPY (EGD) WITH PROPOFOL;  Surgeon: Eloise Harman, DO;  [facility];  Service: Endoscopy;  Laterality: N/A;  . RECTOVAGINAL FISTULA CLOSURE     did not help    Current Outpatient Medications  Medication Sig Dispense Refill  . albuterol (VENTOLIN HFA) 108 (90 Base) MCG/ACT inhaler Inhale 1-2 puffs into the lungs every 6 (six) hours as needed for wheezing or shortness of breath.    . ALPRAZolam (XANAX) 1 MG tablet Take 1 mg by mouth 2 (two) times daily.    . benazepril-hydrochlorthiazide (LOTENSIN HCT) 20-12.5 MG tablet Take 1 tablet by mouth daily. (Patient taking differently: Take 0.5 tablets by mouth daily. ) 90 tablet 1  . BREO ELLIPTA 100-25 MCG/INH AEPB Inhale 1 puff into the lungs daily.     . Cholecalciferol (VITAMIN D3) 250 MCG (10000 UT) capsule Take 30,000 Units by mouth once a week.    . cyanocobalamin (,VITAMIN B-12,) 1000 MCG/ML injection INJECT 1ML INTO THE SKIN EVERY 14 DAYS (Patient taking differently: Inject 1,000 mcg into the muscle every 14  (fourteen) days. ) 6 mL 11  . HYDROcodone-acetaminophen (NORCO) 10-325 MG tablet Take 1 tablet by mouth 2 (two) times daily as needed for severe pain. 60 tablet 0  . lamoTRIgine (LAMICTAL) 200 MG tablet Take 200 mg by mouth daily.     . norethindrone (AYGESTIN) 5 MG tablet TAKE 1 TABLET BY MOUTH EVERY DAY 90 tablet 3  . pantoprazole (PROTONIX) 40 MG tablet TAKE 1 TABLET BY MOUTH 30MIN PRIOR TO FIRST MEAL (Patient taking differently: Take 40 mg by mouth daily before breakfast. ) 90 tablet 3  . potassium chloride (KLOR-CON) 10 MEQ tablet TAKE 1 TABLET BY MOUTH EVERY DAY (Patient taking differently: Take 10 mEq by mouth daily. ) 90 tablet 3  . sertraline (ZOLOFT) 100 MG tablet Take 200 mg by mouth daily.     . SYRINGE-NEEDLE, DISP, 3 ML (B-D SYRINGE/NEEDLE 3CC/25GX5/8) 25G X 5/8" 3 ML MISC Use to administer B12 injection every 14 days 24 each 0  . vedolizumab (ENTYVIO) 300 MG injection Inject 300 mg into the vein every 8 (eight) weeks.     [name] HYDROcodone-acetaminophen (  NORCO) 10-325 MG tablet Take 1 tablet by mouth 2 (two) times daily as needed for severe pain. (Patient not taking: Reported on 11/05/2019) 60 tablet 0  . HYDROcodone-acetaminophen (NORCO) 10-325 MG tablet Take 1 tablet by mouth 2 (two) times daily as needed for severe pain. (Patient not taking: Reported on 11/05/2019) 60 tablet 0   No current facility-administered medications for this visit.    Allergies as of 11/05/2019 - Review Complete 11/05/2019  Allergen Reaction Noted  . Cephalexin Hives and Itching   . Codeine Nausea Only   . Guaifenesin    . Imitrex [sumatriptan]  10/17/2011  . Morphine    . Remicade [infliximab]  05/26/2015  . Sulfonamide derivatives    . Ultram [tramadol hcl]  05/26/2015    Family History  Problem Relation Age of Onset  . Diabetes Mother   . Heart disease Mother   . Heart attack Mother   . Cancer Maternal Aunt        ovarian  . Breast cancer Maternal Grandmother   . Early death Maternal  Grandmother 68  . Cancer Maternal Grandmother        breast  . Heart attack Father   . Heart disease Father   . Cancer Maternal Grandfather        colon    Social History   Socioeconomic History  . Marital status: Married    Spouse name: Not on file  . Number of children: Not on file  . Years of education: Not on file  . Highest education level: Not on file  Occupational History  . Not on file  Tobacco Use  . Smoking status: Former Smoker    Packs/day: 0.50    Years: 25.00    Pack years: 12.50    Types: Cigarettes    Quit date: 12/21/2018    Years since quitting: 0.8  . Smokeless tobacco: Never Used  Vaping Use  . Vaping Use: Never used  Substance and Sexual Activity  . Alcohol use: No  . Drug use: No  . Sexual activity: Not Currently    Partners: Male    Birth control/protection: Pill  Other Topics Concern  . Not on file  Social History Narrative   Regular Exercise-no   Social Determinants of Health   Financial Resource Strain:   . Difficulty of Paying Living Expenses: Not on file  Food Insecurity:   . Worried About Charity fundraiser in the Last Year: Not on file  . Ran Out of Food in the Last Year: Not on file  Transportation Needs:   . Lack of Transportation (Medical): Not on file  . Lack of Transportation (Non-Medical): Not on file  Physical Activity:   . Days of Exercise per Week: Not on file  . Minutes of Exercise per Session: Not on file  Stress:   . Feeling of Stress : Not on file  Social Connections:   . Frequency of Communication with Friends and Family: Not on file  . Frequency of Social Gatherings with Friends and Family: Not on file  . Attends Religious Services: Not on file  . Active Member of Clubs or Organizations: Not on file  . Attends Archivist Meetings: Not on file  . Marital Status: Not on file    Review of Systems: Review of Systems  Constitutional: Negative for chills and fever.  HENT: Negative for congestion and  hearing loss.   Eyes: Negative for blurred vision and double vision.  Respiratory: Negative for  cough and shortness of breath.   Cardiovascular: Negative for chest pain and palpitations.  Gastrointestinal: Negative for abdominal pain, blood in stool, constipation, diarrhea, heartburn, melena and vomiting.  Genitourinary: Negative for dysuria and urgency.  Musculoskeletal: Negative for joint pain and myalgias.  Skin: Negative for itching and rash.  Neurological: Negative for dizziness and headaches.  Psychiatric/Behavioral: Negative for depression. The patient is not nervous/anxious.     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  Continue on Entyvio which she has been tolerating for nearly 4 years now.  Patient does make note that she feels as though the Entyvio prevents her fistula from fully healing and is wondering if we can stretch out her dosing to every 12 weeks.  I discussed that by doing so we run the risk of flaring up her Crohn's disease as well as increase the risk of antibody formation to [facility] she understands.  We will trial this for 3 cycles and see how she does.  Patient told to call office if she has any signs of Crohn's flare including diarrhea, abdominal pain, rectal bleeding.  Most recent blood work in August was stable.  Colonoscopy recall in 2-3 years pending clinical course.   Continue on PPI for chronic reflux.  Follow-up with me in 3 months or sooner if needed.

## 2019-11-13 ENCOUNTER — Encounter (HOSPITAL_COMMUNITY): Payer: Self-pay

## 2019-11-16 ENCOUNTER — Inpatient Hospital Stay (HOSPITAL_COMMUNITY): Payer: 59 | Attending: Hematology

## 2019-11-16 ENCOUNTER — Other Ambulatory Visit: Payer: Self-pay

## 2019-11-16 DIAGNOSIS — D696 Thrombocytopenia, unspecified: Secondary | ICD-10-CM | POA: Insufficient documentation

## 2019-11-16 LAB — CBC WITH DIFFERENTIAL/PLATELET
Abs Immature Granulocytes: 0.02 10*3/uL (ref 0.00–0.07)
Basophils Absolute: 0 10*3/uL (ref 0.0–0.1)
Basophils Relative: 0 %
Eosinophils Absolute: 0.1 10*3/uL (ref 0.0–0.5)
Eosinophils Relative: 1 %
HCT: 37.3 % (ref 36.0–46.0)
Hemoglobin: 12.5 g/dL (ref 12.0–15.0)
Immature Granulocytes: 0 %
Lymphocytes Relative: 21 %
Lymphs Abs: 1.2 10*3/uL (ref 0.7–4.0)
MCH: 31.3 pg (ref 26.0–34.0)
MCHC: 33.5 g/dL (ref 30.0–36.0)
MCV: 93.5 fL (ref 80.0–100.0)
Monocytes Absolute: 0.3 10*3/uL (ref 0.1–1.0)
Monocytes Relative: 5 %
Neutro Abs: 4.2 10*3/uL (ref 1.7–7.7)
Neutrophils Relative %: 73 %
Platelets: 133 10*3/uL — ABNORMAL LOW (ref 150–400)
RBC: 3.99 MIL/uL (ref 3.87–5.11)
RDW: 13.6 % (ref 11.5–15.5)
WBC: 5.7 10*3/uL (ref 4.0–10.5)
nRBC: 0 % (ref 0.0–0.2)

## 2019-11-18 ENCOUNTER — Encounter (HOSPITAL_COMMUNITY)
Admission: RE | Admit: 2019-11-18 | Discharge: 2019-11-18 | Disposition: A | Discharge status: Home / Self Care | Payer: 59 | Source: Ambulatory Visit | Attending: Gastroenterology | Admitting: Gastroenterology

## 2019-11-23 ENCOUNTER — Other Ambulatory Visit: Payer: Self-pay

## 2019-11-23 ENCOUNTER — Inpatient Hospital Stay (HOSPITAL_COMMUNITY): Payer: 59 | Attending: Hematology | Admitting: Hematology

## 2019-11-23 VITALS — BP 125/71 | HR 91 | Temp 97.1°F | Resp 18 | Wt 178.9 lb

## 2019-11-23 DIAGNOSIS — E538 Deficiency of other specified B group vitamins: Secondary | ICD-10-CM | POA: Diagnosis not present

## 2019-11-23 DIAGNOSIS — F319 Bipolar disorder, unspecified: Secondary | ICD-10-CM | POA: Diagnosis not present

## 2019-11-23 DIAGNOSIS — Z87891 Personal history of nicotine dependence: Secondary | ICD-10-CM | POA: Diagnosis not present

## 2019-11-23 DIAGNOSIS — Z793 Long term (current) use of hormonal contraceptives: Secondary | ICD-10-CM | POA: Insufficient documentation

## 2019-11-23 DIAGNOSIS — Z803 Family history of malignant neoplasm of breast: Secondary | ICD-10-CM | POA: Diagnosis not present

## 2019-11-23 DIAGNOSIS — Z7951 Long term (current) use of inhaled steroids: Secondary | ICD-10-CM | POA: Insufficient documentation

## 2019-11-23 DIAGNOSIS — I10 Essential (primary) hypertension: Secondary | ICD-10-CM | POA: Insufficient documentation

## 2019-11-23 DIAGNOSIS — R161 Splenomegaly, not elsewhere classified: Secondary | ICD-10-CM | POA: Diagnosis not present

## 2019-11-23 DIAGNOSIS — N189 Chronic kidney disease, unspecified: Secondary | ICD-10-CM | POA: Diagnosis not present

## 2019-11-23 DIAGNOSIS — K5090 Crohn's disease, unspecified, without complications: Secondary | ICD-10-CM | POA: Diagnosis not present

## 2019-11-23 DIAGNOSIS — K219 Gastro-esophageal reflux disease without esophagitis: Secondary | ICD-10-CM | POA: Insufficient documentation

## 2019-11-23 DIAGNOSIS — D696 Thrombocytopenia, unspecified: Secondary | ICD-10-CM | POA: Insufficient documentation

## 2019-11-23 DIAGNOSIS — F419 Anxiety disorder, unspecified: Secondary | ICD-10-CM | POA: Insufficient documentation

## 2019-11-23 DIAGNOSIS — Z79899 Other long term (current) drug therapy: Secondary | ICD-10-CM | POA: Insufficient documentation

## 2019-11-23 DIAGNOSIS — Z8249 Family history of ischemic heart disease and other diseases of the circulatory system: Secondary | ICD-10-CM | POA: Diagnosis not present

## 2019-11-23 DIAGNOSIS — Z833 Family history of diabetes mellitus: Secondary | ICD-10-CM | POA: Insufficient documentation

## 2019-11-23 NOTE — Patient Instructions (Signed)
[place] at [facility] Discharge Instructions  You were seen today by Dr. Delton Coombes. He went over your recent results. You will be referred to cardiology in [place]. Dr. Delton Coombes will see you back in 1 year for labs and follow up.   Thank you for choosing [place] at [facility] to provide your oncology and hematology care.  To afford each patient quality time with our provider, please arrive at least 15 minutes before your scheduled appointment time.   If you have a lab appointment with the [facility] please come in thru the Main Entrance and check in at the main information desk  You need to re-schedule your appointment should you arrive 10 or more minutes late.  We strive to give you quality time with our providers, and arriving late affects you and other patients whose appointments are after yours.  Also, if you no show three or more times for appointments you may be dismissed from the clinic at the providers discretion.     Again, thank you for choosing [facility].  Our hope is that these requests will decrease the amount of time that you wait before being seen by our physicians.       _____________________________________________________________  Should you have questions after your visit to [facility], please contact our office at [phone] between the hours of 8:00 a.m. and 4:30 p.m.  Voicemails left after 4:00 p.m. will not be returned until the following business day.  For prescription refill requests, have your pharmacy contact our office and allow 72 hours.    Cancer Center Support Programs:   > Cancer Support Group  2nd Tuesday of the month 1pm-2pm, Journey Room

## 2019-11-23 NOTE — Progress Notes (Signed)
[address] [place], [place] [address]   CLINIC:  Medical Oncology/Hematology  PCP:  Cassandria Anger, MD [address] [place] [address]  [phone]  REASON FOR VISIT:  Follow-up for thrombocytopenia and splenomegaly  PRIOR THERAPY: None  CURRENT THERAPY: Observation  INTERVAL HISTORY:  [name] [name], a 49 y.o. female, returns for routine follow-up for her thrombocytopenia and splenomegaly. [name] [name] was last seen on 05/21/2019.  Today she reports feeling well. She also notes easy bleeding if she scratches or cuts herself, as if she is on a blood thinner; she denies taking ASA or NSAIDs. Her appetite is good and she is gaining weight. 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  She has quit smoking. She is trying to get an appointment with cardiology.   REVIEW OF SYSTEMS:  Review of Systems  Constitutional: Positive for fatigue (50%). Negative for appetite change.  Hematological: Bruises/bleeds easily (easy bleeding w/ scratch).  All other systems reviewed and are negative.   PAST MEDICAL/SURGICAL HISTORY:  Past Medical History:  Diagnosis Date  . Anxiety   . Arthritis    Dr. Eddie Dibbles  . Bartholin cyst 2008   vag.  [name] BIPOLAR AFFECTIVE DISORDER 04/14/2007  . Crohn's ON ENTYVIO SINCE FEB [id number]   COMPLICATED BY RECTOVAGINAL FISTULA. SJS WITH REMICADE. FAILED HUMIRA.  [name] Depression   . Elevated glucose 2010  . GERD (gastroesophageal reflux disease)   . Hypertension   . Kidney stone   . LBP (low back pain)    Dr. Mina Marble  . Perianal abscess 2009  . Vitamin B12 deficiency    Past Surgical History:  Procedure Laterality Date  . BALLOON DILATION N/A 10/15/2019   Procedure: BALLOON DILATION;  Surgeon: Eloise Harman, DO;  Location: [facility];  Service: Endoscopy;  Laterality: N/A;  . BIOPSY  10/15/2019   Procedure: BIOPSY;  Surgeon:  Eloise Harman, DO;  Location: [facility];  Service: Endoscopy;;  . COLONOSCOPY  09/2008   Dr. Derrill Kay: ulcers, edema, possible fistula openings all seen in the distal 3 cm of anus/anal canal. 1cm pseudopolyp. rest of colon and TI normal. rectal biopsy with mild chronic active colitis. ileum bx normal.  . COLONOSCOPY WITH PROPOFOL N/A 03/29/2015   SLF: Severe proctocolitis limitied to cecum and rectum. Limited exam of the colon mucosa and anal canal.   . COLONOSCOPY WITH PROPOFOL N/A 10/15/2019   Procedure: COLONOSCOPY WITH PROPOFOL;  Surgeon: Eloise Harman, DO;  Location: [facility];  Service: Endoscopy;  Laterality: N/A;  10:45am  . ELBOW SURGERY     left  . ESOPHAGOGASTRODUODENOSCOPY (EGD) WITH PROPOFOL N/A 03/29/2015   SLF: 1. Erosive gastritis duodentitis.   [name] ESOPHAGOGASTRODUODENOSCOPY (EGD) WITH PROPOFOL N/A 10/15/2019   Procedure: ESOPHAGOGASTRODUODENOSCOPY (EGD) WITH PROPOFOL;  Surgeon: Eloise Harman, DO;  Location: [facility];  Service: Endoscopy;  Laterality: N/A;  . RECTOVAGINAL FISTULA CLOSURE     did not help    SOCIAL HISTORY:  Social History   Socioeconomic History  . Marital status: Married    Spouse name: Not on file  . Number of children: Not on file  . Years of education: Not on file  . Highest education level: Not on file  Occupational History  . Not on file  Tobacco Use  . Smoking status: Former Smoker    Packs/day: 0.50  Years: 25.00    Pack years: 12.50    Types: Cigarettes    Quit date: 12/21/2018    Years since quitting: 0.9  . Smokeless tobacco: Never Used  Vaping Use  . Vaping Use: Never used  Substance and Sexual Activity  . Alcohol use: No  . Drug use: No  . Sexual activity: Not Currently    Partners: Male    Birth control/protection: Pill  Other Topics Concern  . Not on file  Social History Narrative   Regular Exercise-no   Social Determinants of Health   Financial Resource Strain:   . Difficulty of Paying Living Expenses:  Not on file  Food Insecurity:   . Worried About Charity fundraiser in the Last Year: Not on file  . Ran Out of Food in the Last Year: Not on file  Transportation Needs:   . Lack of Transportation (Medical): Not on file  . Lack of Transportation (Non-Medical): Not on file  Physical Activity:   . Days of Exercise per Week: Not on file  . Minutes of Exercise per Session: Not on file  Stress:   . Feeling of Stress : Not on file  Social Connections:   . Frequency of Communication with Friends and Family: Not on file  . Frequency of Social Gatherings with Friends and Family: Not on file  . Attends Religious Services: Not on file  . Active Member of Clubs or Organizations: Not on file  . Attends Archivist Meetings: Not on file  . Marital Status: Not on file  Intimate Partner Violence:   . Fear of Current or Ex-Partner: Not on file  . Emotionally Abused: Not on file  . Physically Abused: Not on file  . Sexually Abused: Not on file    FAMILY HISTORY:  Family History  Problem Relation Age of Onset  . Diabetes Mother   . Heart disease Mother   . Heart attack Mother   . Cancer Maternal Aunt        ovarian  . Breast cancer Maternal Grandmother   . Early death Maternal Grandmother 32  . Cancer Maternal Grandmother        breast  . Heart attack Father   . Heart disease Father   . Cancer Maternal Grandfather        colon    CURRENT MEDICATIONS:  Current Outpatient Medications  Medication Sig Dispense Refill  . albuterol (VENTOLIN HFA) 108 (90 Base) MCG/ACT inhaler Inhale 1-2 puffs into the lungs every 6 (six) hours as needed for wheezing or shortness of breath.    . ALPRAZolam (XANAX) 1 MG tablet Take 1 mg by mouth 2 (two) times daily.    . benazepril-hydrochlorthiazide (LOTENSIN HCT) 20-12.5 MG tablet Take 1 tablet by mouth daily. (Patient taking differently: Take 0.5 tablets by mouth daily. ) 90 tablet 1  . BREO ELLIPTA 100-25 MCG/INH AEPB Inhale 1 puff into the lungs  daily.     . Cholecalciferol (VITAMIN D3) 250 MCG (10000 UT) capsule Take 30,000 Units by mouth once a week.    . cyanocobalamin (,VITAMIN B-12,) 1000 MCG/ML injection INJECT 1ML INTO THE SKIN EVERY 14 DAYS (Patient taking differently: Inject 1,000 mcg into the muscle every 14 (fourteen) days. ) 6 mL 11  . HYDROcodone-acetaminophen (NORCO) 10-325 MG tablet Take 1 tablet by mouth 2 (two) times daily as needed for severe pain. 60 tablet 0  . HYDROcodone-acetaminophen (NORCO) 10-325 MG tablet Take 1 tablet by mouth 2 (two) times daily  as needed for severe pain. 60 tablet 0  . HYDROcodone-acetaminophen (NORCO) 10-325 MG tablet Take 1 tablet by mouth 2 (two) times daily as needed for severe pain. 60 tablet 0  . lamoTRIgine (LAMICTAL) 200 MG tablet Take 200 mg by mouth daily.     . norethindrone (AYGESTIN) 5 MG tablet TAKE 1 TABLET BY MOUTH EVERY DAY 90 tablet 3  . pantoprazole (PROTONIX) 40 MG tablet TAKE 1 TABLET BY MOUTH 30MIN PRIOR TO FIRST MEAL (Patient taking differently: Take 40 mg by mouth daily before breakfast. ) 90 tablet 3  . potassium chloride (KLOR-CON) 10 MEQ tablet TAKE 1 TABLET BY MOUTH EVERY DAY (Patient taking differently: Take 10 mEq by mouth daily. ) 90 tablet 3  . sertraline (ZOLOFT) 100 MG tablet Take 200 mg by mouth daily.     . SYRINGE-NEEDLE, DISP, 3 ML (B-D SYRINGE/NEEDLE 3CC/25GX5/8) 25G X 5/8" 3 ML MISC Use to administer B12 injection every 14 days 24 each 0  . vedolizumab (ENTYVIO) 300 MG injection Inject 300 mg into the vein every 8 (eight) weeks.      No current facility-administered medications for this visit.    ALLERGIES:  Allergies  Allergen Reactions  . Cephalexin Hives and Itching  . Codeine Nausea Only  . Guaifenesin   . Imitrex [Sumatriptan]     2 fingers got very hot  . Morphine     Doesn't work for her, can not move but can still feel the pain  . 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 Sulfonamide  Derivatives     Does not tolerate well  . Ultram [Tramadol Hcl]     [employer] syndrome with either remicade or ultram    PHYSICAL EXAM:  Performance status (ECOG): 1 - Symptomatic but completely ambulatory  Vitals:   11/23/19 0801  BP: 125/71  Pulse: 91  Resp: 18  Temp: (!) 97.1 F (36.2 C)  SpO2: 98%   Wt Readings from Last 3 Encounters:  11/23/19 178 lb 14.4 oz (81.1 kg)  11/02/19 173 lb (78.5 kg)  10/15/19 175 lb 6.4 oz (79.6 kg)   Physical Exam Vitals reviewed.  Constitutional:      Appearance: Normal appearance. She is obese.  Cardiovascular:     Rate and Rhythm: Normal rate and regular rhythm.     Pulses: Normal pulses.     Heart sounds: Murmur (systolic murmur) heard.   Pulmonary:     Effort: Pulmonary effort is normal.     Breath sounds: Normal breath sounds.  Abdominal:     Palpations: Abdomen is soft. There is no hepatomegaly, splenomegaly or mass.     Tenderness: There is no abdominal tenderness.     Hernia: No hernia is present.  Musculoskeletal:     Right lower leg: No edema.     Left lower leg: No edema.  Lymphadenopathy:     Upper Body:     Right upper body: No axillary or pectoral adenopathy.     Left upper body: No axillary or pectoral adenopathy.     Lower Body: No right inguinal adenopathy. No left inguinal adenopathy.  Neurological:     General: No focal deficit present.     Mental Status: She is alert and oriented to person, place, and time.  Psychiatric:        Mood and Affect: Mood normal.        Behavior: Behavior normal.     LABORATORY DATA:  I have reviewed the labs as listed.  CBC Latest Ref Rng & Units 11/16/2019 10/01/2019 04/23/2019  WBC 4.0 - 10.5 K/uL 5.7 5.3 5.0  Hemoglobin 12.0 - 15.0 g/dL 12.5 11.6(L) 11.5(L)  Hematocrit 36 - 46 % 37.3 34.4(L) 34.5(L)  Platelets 150 - 400 K/uL 133(L) 116(L) 132(L)   CMP Latest Ref Rng & Units 10/01/2019 04/23/2019 01/27/2019  Glucose 70 - 99 mg/dL 113(H) 85 108(H)  BUN 6 - 20 mg/dL 16  19 17   Creatinine 0.44 - 1.00 mg/dL 1.39(H) 1.32(H) 1.51(H)  Sodium 135 - 145 mmol/L 138 138 144  Potassium 3.5 - 5.1 mmol/L 3.8 4.2 3.9  Chloride 98 - 111 mmol/L 104 103 102  CO2 22 - 32 mmol/L 23 27 23   Calcium 8.9 - 10.3 mg/dL 9.2 9.1 9.7  Total Protein 6.5 - 8.1 g/dL 6.9 6.8 7.0  Total Bilirubin 0.3 - 1.2 mg/dL 0.7 0.8 0.4  Alkaline Phos 38 - 126 U/L 50 53 61  AST 15 - 41 U/L 12(L) 12(L) 13  ALT 0 - 44 U/L 15 14 8       Component Value Date/Time   RBC 3.99 11/16/2019 0912   MCV 93.5 11/16/2019 0912   MCV 90 01/27/2019 1002   MCH 31.3 11/16/2019 0912   MCHC 33.5 11/16/2019 0912   RDW 13.6 11/16/2019 0912   RDW 13.0 01/27/2019 1002   LYMPHSABS 1.2 11/16/2019 0912   LYMPHSABS 1.5 01/27/2019 1002   MONOABS 0.3 11/16/2019 0912   EOSABS 0.1 11/16/2019 0912   EOSABS 0.1 01/27/2019 1002   BASOSABS 0.0 11/16/2019 0912   BASOSABS 0.0 01/27/2019 1002   Lab Results  Component Value Date   VD25OH 62.33 10/01/2019   VD25OH 79.27 05/06/2019   VD25OH 82.4 01/27/2019    DIAGNOSTIC IMAGING:  I have independently reviewed the scans and discussed with the patient. No results found.   ASSESSMENT:  1.  Mild thrombocytopenia: -CBC on 04/23/2019 with platelet count 131.  Mildly low platelet count since 2017. -No prior history of easy bruising or bleeding. -We reviewed labs from 05/06/2019.  Platelet count in blue top tube is 138.  Z61, folic acid, methylmalonic acid, copper levels are normal. -SPEP is negative.  Hepatitis serology was negative.  ANA and rheumatoid factor was negative. -Mild thrombocytopenia likely from splenomegaly.  No intervention necessary.  2.  Splenomegaly: -Spleen palpable 4 to 5 fingerbreadths below the left costal margin.  She reports history of splenomegaly since she was diagnosed with mono in her mid 4s. -Crohn's disease is rarely associated with splenomegaly. -LDH is normal.  I reviewed PET scan results from 05/18/2019 which showed splenomegaly.  No  hypermetabolic lesions.  3.  Crohn's disease: -Crohn's disease since her mid 42s. -She has developed Stevens-Johnson syndrome with Remicade. -She is switched to [facility] and has been tolerating well.  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  4.  Normocytic anemia: -She has mild normocytic anemia with hemoglobin 11.5 on 04/23/2019.  This is from complication of CKD.   PLAN:  1.  Mild thrombocytopenia: -CBC from 11/16/2019 reviewed by me shows platelet count 133. -She does not have any bleeding issues. -RTC 1 year for follow-up with labs.  2.  Normocytic anemia: -This has become normal at 12.5.  No intervention necessary.   Orders placed this encounter:  No orders of the defined types were placed in this encounter.    Derek Jack, MD [place] [phone]   I, Milinda Antis, am acting  as a scribe for Dr. Sanda Linger.  I, Derek Jack MD, have reviewed the above documentation for accuracy and completeness, and I agree with the above.

## 2019-11-25 ENCOUNTER — Ambulatory Visit: Payer: 59 | Admitting: Internal Medicine

## 2019-12-03 ENCOUNTER — Ambulatory Visit: Payer: 59 | Admitting: Internal Medicine

## 2019-12-14 ENCOUNTER — Encounter (HOSPITAL_COMMUNITY): Payer: Self-pay

## 2019-12-14 ENCOUNTER — Other Ambulatory Visit: Payer: Self-pay

## 2019-12-14 ENCOUNTER — Encounter (HOSPITAL_COMMUNITY)
Admission: RE | Admit: 2019-12-14 | Discharge: 2019-12-14 | Disposition: A | Discharge status: Home / Self Care | Payer: 59 | Source: Ambulatory Visit | Attending: Gastroenterology | Admitting: Gastroenterology

## 2019-12-14 DIAGNOSIS — K5090 Crohn's disease, unspecified, without complications: Secondary | ICD-10-CM | POA: Insufficient documentation

## 2019-12-14 MED ORDER — SODIUM CHLORIDE 0.9 % IV SOLN: Freq: Once | INTRAVENOUS | Status: AC

## 2019-12-14 MED ORDER — VEDOLIZUMAB 300 MG IV SOLR
300.0000 mg | Freq: Once | INTRAVENOUS | Status: AC
  Administered 2019-12-14: 300 mg via INTRAVENOUS
  Filled 2019-12-14: qty 5

## 2019-12-14 MED ORDER — METHYLPREDNISOLONE SODIUM SUCC 40 MG IJ SOLR
40.0000 mg | Freq: Once | INTRAMUSCULAR | Status: AC
  Administered 2019-12-14: 40 mg via INTRAVENOUS

## 2020-01-11 ENCOUNTER — Telehealth: Payer: Self-pay

## 2020-01-11 NOTE — Telephone Encounter (Signed)
Received a call from Caren Griffins (@ [employer])( direct phone number [phone]). She states that the pt's Rx for [name] will run out on November 30th. The only way she can help the pt is to do a six month allowance instead of the yearly. (according to pts insurance)Shes going to fax request to [place] to fill out and fax it back to her. She gave [place] her direct line incase we have any questions.

## 2020-01-18 ENCOUNTER — Other Ambulatory Visit: Payer: Self-pay | Admitting: Internal Medicine

## 2020-01-18 NOTE — Telephone Encounter (Signed)
Received a call from [employer] asking for office notes to approve Entyvio. Recent office notes faxed to [phone]. Waiting on an approval from [place].

## 2020-01-21 NOTE — Telephone Encounter (Signed)
Received a call from [place] from [employer]. pts Entyvio was approved through 07-20-2020. Per Caren Griffins, she can be contacted again [phone]) when the order expires and she'll update the order. Physician order was faxed to Azell Der, RN at [facility].

## 2020-01-21 NOTE — Telephone Encounter (Signed)
Lmom, for Caren Griffins at [facility]. Calling to see if Charlann Lange was approved for pt. Waiting on a return call.

## 2020-01-29 ENCOUNTER — Other Ambulatory Visit: Payer: Self-pay

## 2020-02-01 ENCOUNTER — Other Ambulatory Visit: Payer: Self-pay | Admitting: Internal Medicine

## 2020-02-01 ENCOUNTER — Encounter: Payer: Self-pay | Admitting: Internal Medicine

## 2020-02-01 ENCOUNTER — Other Ambulatory Visit: Payer: Self-pay

## 2020-02-01 ENCOUNTER — Ambulatory Visit (INDEPENDENT_AMBULATORY_CARE_PROVIDER_SITE_OTHER): Payer: 59 | Admitting: Internal Medicine

## 2020-02-01 DIAGNOSIS — G8929 Other chronic pain: Secondary | ICD-10-CM

## 2020-02-01 DIAGNOSIS — E559 Vitamin D deficiency, unspecified: Secondary | ICD-10-CM | POA: Diagnosis not present

## 2020-02-01 DIAGNOSIS — M5440 Lumbago with sciatica, unspecified side: Secondary | ICD-10-CM | POA: Diagnosis not present

## 2020-02-01 MED ORDER — ALBUTEROL SULFATE HFA 108 (90 BASE) MCG/ACT IN AERS: 1.0000 | INHALATION_SPRAY | Freq: Four times a day (QID) | RESPIRATORY_TRACT | 5 refills | Status: DC | PRN

## 2020-02-01 MED ORDER — HYDROCODONE-ACETAMINOPHEN 10-325 MG PO TABS: 1.0000 | ORAL_TABLET | Freq: Two times a day (BID) | ORAL | 0 refills | Status: DC | PRN

## 2020-02-01 MED ORDER — CIPROFLOXACIN HCL 500 MG PO TABS: 500.0000 mg | ORAL_TABLET | Freq: Two times a day (BID) | ORAL | 0 refills | Status: DC

## 2020-02-01 NOTE — Assessment & Plan Note (Signed)
Norco bid  Potential benefits of a long term opioids use as well as potential risks (i.e. addiction risk, apnea etc) and complications (i.e. Somnolence, constipation and others) were explained to the patient and were aknowledged.

## 2020-02-01 NOTE — Progress Notes (Signed)
Subjective:  Patient ID: [name] [name], female    DOB: 1971/02/03  Age: 49 y.o. MRN: [id number]  CC: Follow-up (3 month f/u)   HPI [name] [name] presents for LBP, anxiety,GERD f/u  Outpatient Medications Prior to Visit  Medication Sig Dispense Refill  . albuterol (VENTOLIN HFA) 108 (90 Base) MCG/ACT inhaler Inhale 1-2 puffs into the lungs every 6 (six) hours as needed for wheezing or shortness of breath.    . ALPRAZolam (XANAX) 1 MG tablet Take 1 mg by mouth 2 (two) times daily.    . benazepril-hydrochlorthiazide (LOTENSIN HCT) 20-12.5 MG tablet TAKE 1 TABLET BY MOUTH EVERY DAY 90 tablet 1  . BREO ELLIPTA 100-25 MCG/INH AEPB Inhale 1 puff into the lungs daily.     . Cholecalciferol (VITAMIN D3) 250 MCG (10000 UT) capsule Take 30,000 Units by mouth once a week.    . cyanocobalamin (,VITAMIN B-12,) 1000 MCG/ML injection INJECT 1ML INTO THE SKIN EVERY 14 DAYS (Patient taking differently: Inject 1,000 mcg into the muscle every 14 (fourteen) days. ) 6 mL 11  . HYDROcodone-acetaminophen (NORCO) 10-325 MG tablet Take 1 tablet by mouth 2 (two) times daily as needed for severe pain. 60 tablet 0  . HYDROcodone-acetaminophen (NORCO) 10-325 MG tablet Take 1 tablet by mouth 2 (two) times daily as needed for severe pain. 60 tablet 0  . lamoTRIgine (LAMICTAL) 200 MG tablet Take 200 mg by mouth daily.     . norethindrone (AYGESTIN) 5 MG tablet TAKE 1 TABLET BY MOUTH EVERY DAY 90 tablet 3  . pantoprazole (PROTONIX) 40 MG tablet TAKE 1 TABLET BY MOUTH 30MIN PRIOR TO FIRST MEAL (Patient taking differently: Take 40 mg by mouth daily before breakfast. ) 90 tablet 3  . potassium chloride (KLOR-CON) 10 MEQ tablet TAKE 1 TABLET BY MOUTH EVERY DAY (Patient taking differently: Take 10 mEq by mouth daily. ) 90 tablet 3  . sertraline (ZOLOFT) 100 MG tablet Take 200 mg by mouth daily.     . SYRINGE-NEEDLE, DISP, 3 ML (B-D SYRINGE/NEEDLE 3CC/25GX5/8) 25G X 5/8" 3 ML MISC Use to administer B12 injection every 14 days  24 each 0  . vedolizumab (ENTYVIO) 300 MG injection Inject 300 mg into the vein every 8 (eight) weeks.     [name] HYDROcodone-acetaminophen (NORCO) 10-325 MG tablet Take 1 tablet by mouth 2 (two) times daily as needed for severe pain. 60 tablet 0   No facility-administered medications prior to visit.    ROS: Review of Systems  Constitutional: Negative for activity change, appetite change, chills, fatigue and unexpected weight change.  HENT: Negative for congestion, mouth sores and sinus pressure.   Eyes: Negative for visual disturbance.  Respiratory: Negative for cough and chest tightness.   Gastrointestinal: Negative for abdominal pain and nausea.  Genitourinary: Negative for difficulty urinating, frequency and vaginal pain.  Musculoskeletal: Positive for back pain. Negative for gait problem.  Skin: Negative for pallor and rash.  Neurological: Negative for dizziness, tremors, weakness, numbness and headaches.  Psychiatric/Behavioral: Negative for confusion, sleep disturbance and suicidal ideas. The patient is nervous/anxious.     Objective:  BP 120/82 (BP Location: Left Arm)   Pulse 96   Temp 98.7 F (37.1 C) (Oral)   Wt 174 lb 6.4 oz (79.1 kg)   LMP 03/03/2014 Comment: still bleeding  SpO2 98%   BMI 31.90 kg/m   BP Readings from Last 3 Encounters:  02/01/20 120/82  12/14/19 129/75  11/23/19 125/71    Wt Readings from Last 3 Encounters:  02/01/20 174 lb 6.4 oz (79.1 kg)  12/14/19 180 lb 12.4 oz (82 kg)  11/23/19 178 lb 14.4 oz (81.1 kg)    Physical Exam Constitutional:      General: She is not in acute distress.    Appearance: She is well-developed. She is obese.  HENT:     Head: Normocephalic.     Right Ear: External ear normal.     Left Ear: External ear normal.     Nose: Nose normal.     Mouth/Throat:     Mouth: Oropharynx is clear and moist.  Eyes:     General:        Right eye: No discharge.        Left eye: No discharge.     Conjunctiva/sclera:  Conjunctivae normal.     Pupils: Pupils are equal, round, and reactive to light.  Neck:     Thyroid: No thyromegaly.     Vascular: No JVD.     Trachea: No tracheal deviation.  Cardiovascular:     Rate and Rhythm: Normal rate and regular rhythm.     Heart sounds: Normal heart sounds.  Pulmonary:     Effort: No respiratory distress.     Breath sounds: No stridor. No wheezing.  Abdominal:     General: Bowel sounds are normal. There is no distension.     Palpations: Abdomen is soft. There is no mass.     Tenderness: There is no abdominal tenderness. There is no guarding or rebound.  Musculoskeletal:        General: Tenderness present. No edema.     Cervical back: Normal range of motion and neck supple.  Lymphadenopathy:     Cervical: No cervical adenopathy.  Skin:    Findings: No erythema or rash.  Neurological:     Cranial Nerves: No cranial nerve deficit.     Motor: No abnormal muscle tone.     Coordination: Coordination normal.     Deep Tendon Reflexes: Reflexes normal.  Psychiatric:        Mood and Affect: Mood and affect normal.        Behavior: Behavior normal.        Thought Content: Thought content normal.        Judgment: Judgment normal.    LS tender  Lab Results  Component Value Date   WBC 5.7 11/16/2019   HGB 12.5 11/16/2019   HCT 37.3 11/16/2019   PLT 133 (L) 11/16/2019   GLUCOSE 113 (H) 10/01/2019   CHOL 168 08/30/2014   TRIG 99.0 08/30/2014   HDL 27.40 (L) 08/30/2014   LDLDIRECT 171.9 05/01/2011   LDLCALC 121 (H) 08/30/2014   ALT 15 10/01/2019   AST 12 (L) 10/01/2019   NA 138 10/01/2019   K 3.8 10/01/2019   CL 104 10/01/2019   CREATININE 1.39 (H) 10/01/2019   BUN 16 10/01/2019   CO2 23 10/01/2019   TSH 0.585 05/28/2018    No results found.  Assessment & Plan:    [name], MD

## 2020-02-01 NOTE — Assessment & Plan Note (Signed)
Vit D 

## 2020-02-01 NOTE — Addendum Note (Signed)
Addended by: Trenda Moots on: 11/88/6773 08:35 AM   Modules accepted: Orders

## 2020-02-07 ENCOUNTER — Other Ambulatory Visit: Payer: Self-pay | Admitting: Gastroenterology

## 2020-02-08 ENCOUNTER — Encounter (HOSPITAL_COMMUNITY): Payer: 59

## 2020-02-08 ENCOUNTER — Encounter: Payer: Self-pay | Admitting: Internal Medicine

## 2020-02-11 ENCOUNTER — Other Ambulatory Visit: Payer: Self-pay

## 2020-02-11 ENCOUNTER — Encounter: Payer: Self-pay | Admitting: Cardiology

## 2020-02-11 ENCOUNTER — Ambulatory Visit (INDEPENDENT_AMBULATORY_CARE_PROVIDER_SITE_OTHER): Payer: 59 | Admitting: Cardiology

## 2020-02-11 VITALS — BP 138/78 | HR 106 | Ht 62.0 in | Wt 173.0 lb

## 2020-02-11 DIAGNOSIS — R011 Cardiac murmur, unspecified: Secondary | ICD-10-CM | POA: Diagnosis not present

## 2020-02-11 NOTE — Patient Instructions (Signed)
Your physician recommends that you schedule a follow-up appointment in: PENDING   Your physician recommends that you continue on your current medications as directed. Please refer to the Current Medication list given to you today.  Your physician has requested that you have an echocardiogram. Echocardiography is a painless test that uses sound waves to create images of your heart. It provides your doctor with information about the size and shape of your heart and how well your heart's chambers and valves are working. This procedure takes approximately one hour. There are no restrictions for this procedure.  Thank you for choosing [facility]!!

## 2020-02-11 NOTE — Progress Notes (Addendum)
Clinical Summary [name] [name] is a 49 y.o.female seen as new consult, referred by Dr Guido Sander for heart murmur  1. Heart murmur - told she had a murmur for quite some time - some generlaized fatigue. Occasional SOB. No chest pains. No recent edema.      2. Thrombocytopenia - followed by heme     SH: former EMT, phlebotomist, nurse    Past Medical History:  Diagnosis Date  . Anxiety   . Arthritis    Dr. Eddie Dibbles  . Bartholin cyst 2008   vag.  [name] BIPOLAR AFFECTIVE DISORDER 04/14/2007  . Crohn's ON ENTYVIO SINCE FEB [id number]   COMPLICATED BY RECTOVAGINAL FISTULA. SJS WITH REMICADE. FAILED HUMIRA.  [name] Depression   . Elevated glucose 2010  . GERD (gastroesophageal reflux disease)   . Hypertension   . Kidney stone   . LBP (low back pain)    Dr. Mina Marble  . Perianal abscess 2009  . Vitamin B12 deficiency      Allergies  Allergen Reactions  . Cephalexin Hives and Itching  . Codeine Nausea Only  . Guaifenesin   . Imitrex [Sumatriptan]     2 fingers got very hot  . Morphine     Doesn't work for her, can not move but can still feel the pain  . 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 Sulfonamide Derivatives     Does not tolerate well  . Ultram [Tramadol Hcl]     [employer] syndrome with either remicade or ultram     Current Outpatient Medications  Medication Sig Dispense Refill  . albuterol (VENTOLIN HFA) 108 (90 Base) MCG/ACT inhaler Inhale 1-2 puffs into the lungs every 6 (six) hours as needed for wheezing or shortness of breath. 18 g 5  . ALPRAZolam (XANAX) 1 MG tablet Take 1 mg by mouth 2 (two) times daily.    . benazepril-hydrochlorthiazide (LOTENSIN HCT) 20-12.5 MG tablet TAKE 1 TABLET BY MOUTH EVERY DAY 90 tablet 1  . BREO ELLIPTA 100-25 MCG/INH AEPB Inhale 1 puff into the lungs daily.     . Cholecalciferol (VITAMIN D3) 250 MCG (10000 UT) capsule Take 30,000 Units by mouth once a week.    . ciprofloxacin  (CIPRO) 500 MG tablet Take 1 tablet (500 mg total) by mouth 2 (two) times daily for 10 days. 28 tablet 0  . cyanocobalamin (,VITAMIN B-12,) 1000 MCG/ML injection INJECT 1ML INTO THE SKIN EVERY 14 DAYS (Patient taking differently: Inject 1,000 mcg into the muscle every 14 (fourteen) days.) 6 mL 11  . HYDROcodone-acetaminophen (NORCO) 10-325 MG tablet Take 1 tablet by mouth 2 (two) times daily as needed for severe pain. 60 tablet 0  . HYDROcodone-acetaminophen (NORCO) 10-325 MG tablet Take 1 tablet by mouth 2 (two) times daily as needed for severe pain. 60 tablet 0  . HYDROcodone-acetaminophen (NORCO) 10-325 MG tablet Take 1 tablet by mouth 2 (two) times daily as needed for severe pain. 60 tablet 0  . lamoTRIgine (LAMICTAL) 200 MG tablet Take 200 mg by mouth daily.     . norethindrone (AYGESTIN) 5 MG tablet TAKE 1 TABLET BY MOUTH EVERY DAY 90 tablet 3  . pantoprazole (PROTONIX) 40 MG tablet TAKE 1 TABLET BY MOUTH 30MIN PRIOR TO FIRST MEAL 90 tablet 3  . potassium chloride (KLOR-CON) 10 MEQ tablet TAKE 1 TABLET BY MOUTH EVERY DAY (Patient taking differently: Take 10 mEq by mouth daily.) 90 tablet 3  . sertraline (  ZOLOFT) 100 MG tablet Take 200 mg by mouth daily.    . SYRINGE-NEEDLE, DISP, 3 ML (B-D SYRINGE/NEEDLE 3CC/25GX5/8) 25G X 5/8" 3 ML MISC Use to administer B12 injection every 14 days 24 each 0  . vedolizumab (ENTYVIO) 300 MG injection Inject 300 mg into the vein every 8 (eight) weeks.      No current facility-administered medications for this visit.     Past Surgical History:  Procedure Laterality Date  . BALLOON DILATION N/A 10/15/2019   Procedure: BALLOON DILATION;  Surgeon: Eloise Harman, DO;  [facility];  Service: Endoscopy;  Laterality: N/A;  . BIOPSY  10/15/2019   Procedure: BIOPSY;  Surgeon: Eloise Harman, DO;  [facility];  Service: Endoscopy;;  . COLONOSCOPY  09/2008   Dr. Derrill Kay: ulcers, edema, possible fistula openings all seen in the distal 3 cm of  anus/anal canal. 1cm pseudopolyp. rest of colon and TI normal. rectal biopsy with mild chronic active colitis. ileum bx normal.  . COLONOSCOPY WITH PROPOFOL N/A 03/29/2015   SLF: Severe proctocolitis limitied to cecum and rectum. Limited exam of the colon mucosa and anal canal.   . COLONOSCOPY WITH PROPOFOL N/A 10/15/2019   Procedure: COLONOSCOPY WITH PROPOFOL;  Surgeon: Eloise Harman, DO;  [facility];  Service: Endoscopy;  Laterality: N/A;  10:45am  . ELBOW SURGERY     left  . ESOPHAGOGASTRODUODENOSCOPY (EGD) WITH PROPOFOL N/A 03/29/2015   SLF: 1. Erosive gastritis duodentitis.   [name] ESOPHAGOGASTRODUODENOSCOPY (EGD) WITH PROPOFOL N/A 10/15/2019   Procedure: ESOPHAGOGASTRODUODENOSCOPY (EGD) WITH PROPOFOL;  Surgeon: Eloise Harman, DO;  [facility];  Service: Endoscopy;  Laterality: N/A;  . RECTOVAGINAL FISTULA CLOSURE     did not help     Allergies  Allergen Reactions  . Cephalexin Hives and Itching  . Codeine Nausea Only  . Guaifenesin   . Imitrex [Sumatriptan]     2 fingers got very hot  . Morphine     Doesn't work for her, can not move but can still feel the pain  . 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 Sulfonamide Derivatives     Does not tolerate well  . Ultram [Tramadol Hcl]     [employer] syndrome with either remicade or ultram      Family History  Problem Relation Age of Onset  . Diabetes Mother   . Heart disease Mother   . Heart attack Mother   . Cancer Maternal Aunt        ovarian  . Breast cancer Maternal Grandmother   . Early death Maternal Grandmother 48  . Cancer Maternal Grandmother        breast  . Heart attack Father   . Heart disease Father   . Cancer Maternal Grandfather        colon     Social History Ms. Peoples reports that she quit smoking about 13 months ago. Her smoking use included cigarettes. She has a 12.50 pack-year smoking history. She has never used smokeless  tobacco. Ms. Skousen reports no history of alcohol use.   Review of Systems CONSTITUTIONAL: No weight loss, fever, chills, weakness or fatigue.  HEENT: Eyes: No visual loss, blurred vision, double vision or yellow sclerae.No hearing loss, sneezing, congestion, runny nose or sore throat.  SKIN: No rash or itching.  CARDIOVASCULAR: per hpi RESPIRATORY: No shortness of breath, cough or sputum.  GASTROINTESTINAL: No anorexia, nausea, vomiting  or diarrhea. No abdominal pain or blood.  GENITOURINARY: No burning on urination, no polyuria NEUROLOGICAL: No headache, dizziness, syncope, paralysis, ataxia, numbness or tingling in the extremities. No change in bowel or bladder control.  MUSCULOSKELETAL: No muscle, back pain, joint pain or stiffness.  LYMPHATICS: No enlarged nodes. No history of splenectomy.  PSYCHIATRIC: No history of depression or anxiety.  ENDOCRINOLOGIC: No reports of sweating, cold or heat intolerance. No polyuria or polydipsia.  [name]   Physical Examination Today's Vitals   02/11/20 0848  BP: 138/78  Pulse: (!) 106  SpO2: 97%  Weight: 173 lb (78.5 kg)  Height: 5' 2"  (1.575 m)   Body mass index is 31.64 kg/m.  Gen: resting comfortably, no acute distress HEENT: no scleral icterus, pupils equal round and reactive, no palptable cervical adenopathy,  CV: RRR, 3/6 systolic murmur apex, no jvd Resp: Clear to auscultation bilaterally GI: abdomen is soft, non-tender, non-distended, normal bowel sounds, no hepatosplenomegaly MSK: extremities are warm, no edema.  Skin: warm, no rash Neuro:  no focal deficits Psych: appropriate affect      Assessment and Plan  1. 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 WIll need follow up to discuss findings, likely start beta blocker, risk stratify for ICD.    Arnoldo Lenis, M.D.

## 2020-02-11 NOTE — Addendum Note (Signed)
Addended by: Merlene Laughter on: 02/11/2020 03:21 PM   Modules accepted: Orders

## 2020-02-17 ENCOUNTER — Other Ambulatory Visit: Payer: 59 | Admitting: Adult Health

## 2020-02-18 ENCOUNTER — Other Ambulatory Visit: Payer: Self-pay | Admitting: Internal Medicine

## 2020-02-18 MED ORDER — KETOROLAC TROMETHAMINE 10 MG PO TABS
10.0000 mg | ORAL_TABLET | Freq: Three times a day (TID) | ORAL | 0 refills | Status: DC
Start: 2020-02-18 — End: 2020-04-28

## 2020-02-27 LAB — PMP SCREEN PROFILE (10S), URINE
Amphetamine Scrn, Ur: NEGATIVE ng/mL
BARBITURATE SCREEN URINE: NEGATIVE ng/mL
BENZODIAZEPINE SCREEN, URINE: POSITIVE ng/mL — AB
CANNABINOIDS UR QL SCN: NEGATIVE ng/mL
Cocaine (Metab) Scrn, Ur: NEGATIVE ng/mL
Creatinine(Crt), U: 193 mg/dL (ref 20.0–300.0)
Methadone Screen, Urine: NEGATIVE ng/mL
OXYCODONE+OXYMORPHONE UR QL SCN: NEGATIVE ng/mL
Opiate Scrn, Ur: POSITIVE ng/mL — AB
Ph of Urine: 5.5 (ref 4.5–8.9)
Phencyclidine Qn, Ur: NEGATIVE ng/mL
Propoxyphene Scrn, Ur: NEGATIVE ng/mL

## 2020-03-01 ENCOUNTER — Ambulatory Visit (INDEPENDENT_AMBULATORY_CARE_PROVIDER_SITE_OTHER): Payer: 59

## 2020-03-01 ENCOUNTER — Other Ambulatory Visit: Payer: Self-pay

## 2020-03-01 DIAGNOSIS — R011 Cardiac murmur, unspecified: Secondary | ICD-10-CM | POA: Diagnosis not present

## 2020-03-01 LAB — ECHOCARDIOGRAM COMPLETE
AV Mean grad: 35 mmHg
AV Peak grad: 71.9 mmHg
Ao pk vel: 4.24 m/s
Area-P 1/2: 4.63 cm2
Calc EF: 60.9 %
S' Lateral: 2.64 cm
Single Plane A2C EF: 61.3 %
Single Plane A4C EF: 59.7 %

## 2020-03-07 ENCOUNTER — Encounter (HOSPITAL_COMMUNITY): Payer: 59

## 2020-03-08 ENCOUNTER — Telehealth: Payer: Self-pay | Admitting: *Deleted

## 2020-03-08 DIAGNOSIS — R011 Cardiac murmur, unspecified: Secondary | ICD-10-CM

## 2020-03-08 NOTE — Telephone Encounter (Signed)
-----   Message from Arnoldo Lenis, MD sent at 03/07/2020 12:44 PM EST ----- Echo shows normal pumping function of the heart. THere is some evidence of elevated pressure across the aortic valve which likely is the cause of her murmur, however its not clear based on the images if the elvated pressure is related to an issue with the valve itself or perhaps the surrouning heart muscle. Can we order a cardiac MRI for heart murmur, this will give much clearer picturs of this area  Zandra Abts MD

## 2020-03-08 NOTE — Telephone Encounter (Signed)
Pt voiced understanding and agreeable to cardiac MRI - orders placed and will forward to schedulers

## 2020-03-09 ENCOUNTER — Telehealth: Payer: Self-pay | Admitting: Cardiology

## 2020-03-09 NOTE — Telephone Encounter (Signed)
Spoke with patient regarding preferred scheduling weekdays and times for the Cardiac MRI ordered by Dr. Glennis Brink patient as soon as we hear from the insurance regarding the prior authorization, I will be in touch with the appointment information.

## 2020-03-11 ENCOUNTER — Encounter: Payer: Self-pay | Admitting: Cardiology

## 2020-03-11 NOTE — Telephone Encounter (Signed)
Spoke with patient regarding scheduled appointment 04/08/20 at 9:00 am for the Cardiac MRI ordered by Dr. Victorio Palm time is 8:30 am--1st floor admissions office at [facility]  Will mail information to patient and she voiced her understanding.[name] [name]

## 2020-03-14 ENCOUNTER — Encounter (HOSPITAL_COMMUNITY): Payer: Self-pay

## 2020-03-14 ENCOUNTER — Other Ambulatory Visit: Payer: Self-pay

## 2020-03-14 ENCOUNTER — Encounter (HOSPITAL_COMMUNITY)
Admission: RE | Admit: 2020-03-14 | Discharge: 2020-03-14 | Disposition: A | Discharge status: Home / Self Care | Payer: 59 | Source: Ambulatory Visit | Attending: Gastroenterology | Admitting: Gastroenterology

## 2020-03-14 DIAGNOSIS — K5090 Crohn's disease, unspecified, without complications: Secondary | ICD-10-CM | POA: Diagnosis not present

## 2020-03-14 MED ORDER — SODIUM CHLORIDE 0.9 % IV SOLN: Freq: Once | INTRAVENOUS | Status: AC

## 2020-03-14 MED ORDER — METHYLPREDNISOLONE SODIUM SUCC 40 MG IJ SOLR
40.0000 mg | Freq: Once | INTRAMUSCULAR | Status: AC
  Administered 2020-03-14: 40 mg via INTRAVENOUS

## 2020-03-14 MED ORDER — VEDOLIZUMAB 300 MG IV SOLR
300.0000 mg | Freq: Once | INTRAVENOUS | Status: AC
  Administered 2020-03-14: 300 mg via INTRAVENOUS
  Filled 2020-03-14: qty 5

## 2020-03-17 ENCOUNTER — Other Ambulatory Visit: Payer: 59 | Admitting: Adult Health

## 2020-03-31 ENCOUNTER — Ambulatory Visit (INDEPENDENT_AMBULATORY_CARE_PROVIDER_SITE_OTHER): Payer: 59 | Admitting: Internal Medicine

## 2020-03-31 ENCOUNTER — Other Ambulatory Visit: Payer: Self-pay

## 2020-03-31 ENCOUNTER — Encounter: Payer: Self-pay | Admitting: Internal Medicine

## 2020-03-31 ENCOUNTER — Other Ambulatory Visit (HOSPITAL_COMMUNITY)
Admission: RE | Admit: 2020-03-31 | Discharge: 2020-03-31 | Disposition: A | Discharge status: Home / Self Care | Payer: 59 | Source: Ambulatory Visit | Attending: Internal Medicine | Admitting: Internal Medicine

## 2020-03-31 VITALS — BP 141/75 | HR 97 | Temp 96.8°F | Ht 62.0 in | Wt 171.4 lb

## 2020-03-31 DIAGNOSIS — N824 Other female intestinal-genital tract fistulae: Secondary | ICD-10-CM

## 2020-03-31 DIAGNOSIS — K50013 Crohn's disease of small intestine with fistula: Secondary | ICD-10-CM | POA: Insufficient documentation

## 2020-03-31 DIAGNOSIS — K219 Gastro-esophageal reflux disease without esophagitis: Secondary | ICD-10-CM | POA: Diagnosis not present

## 2020-03-31 LAB — COMPREHENSIVE METABOLIC PANEL
ALT: 11 U/L (ref 0–44)
AST: 13 U/L — ABNORMAL LOW (ref 15–41)
Albumin: 4.3 g/dL (ref 3.5–5.0)
Alkaline Phosphatase: 59 U/L (ref 38–126)
Anion gap: 8 (ref 5–15)
BUN: 20 mg/dL (ref 6–20)
CO2: 23 mmol/L (ref 22–32)
Calcium: 9 mg/dL (ref 8.9–10.3)
Chloride: 102 mmol/L (ref 98–111)
Creatinine, Ser: 1.47 mg/dL — ABNORMAL HIGH (ref 0.44–1.00)
GFR, Estimated: 43 mL/min — ABNORMAL LOW (ref >60)
Glucose, Bld: 121 mg/dL — ABNORMAL HIGH (ref 70–99)
Potassium: 3.4 mmol/L — ABNORMAL LOW (ref 3.5–5.1)
Sodium: 133 mmol/L — ABNORMAL LOW (ref 135–145)
Total Bilirubin: 0.6 mg/dL (ref 0.3–1.2)
Total Protein: 7.2 g/dL (ref 6.5–8.1)

## 2020-03-31 LAB — CBC
HCT: 42.9 % (ref 36.0–46.0)
Hemoglobin: 14.3 g/dL (ref 12.0–15.0)
MCH: 30.6 pg (ref 26.0–34.0)
MCHC: 33.3 g/dL (ref 30.0–36.0)
MCV: 91.7 fL (ref 80.0–100.0)
Platelets: 143 10*3/uL — ABNORMAL LOW (ref 150–400)
RBC: 4.68 MIL/uL (ref 3.87–5.11)
RDW: 14.1 % (ref 11.5–15.5)
WBC: 8.1 10*3/uL (ref 4.0–10.5)
nRBC: 0 % (ref 0.0–0.2)

## 2020-03-31 LAB — VITAMIN D 25 HYDROXY (VIT D DEFICIENCY, FRACTURES): Vit D, 25-Hydroxy: 100.39 ng/mL — ABNORMAL HIGH (ref 30–100)

## 2020-03-31 LAB — SEDIMENTATION RATE: Sed Rate: 12 mm/hr (ref 0–22)

## 2020-03-31 LAB — VITAMIN B12: Vitamin B-12: 390 pg/mL (ref 180–914)

## 2020-03-31 NOTE — Progress Notes (Signed)
Referring Provider: Cassandria Anger, MD Primary Care Physician:  Cassandria Anger, MD Primary GI:  Dr. Abbey Chatters  Chief Complaint  Patient presents with  . Crohn's Disease    HPI:   [name] [name] is a 50 y.o. female who presents to the clinic today for follow up visit. 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 She established care with [place] in January 2017. She has previously failed Imuran, Humira, Asacol. 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 She was reportedly in a research trial at [facility] for stem cell research at one point.   According to gynecology note dated 05/09/2018 noted a draining rectovaginal fistula with left buttock fluid collection. Cultures was sent and found to be staph aureus. [name] was held during this time which she has been maintained on since 2017. CT pelvis in June showed evidence of rectovaginal fistula unchanged from 2018. Patient was continued on Entyvio with most recent CT in December 10, 2018 which showed a nodular density posterior to the rectum likely representing scarring related to prior inflammatory changes collection. No drainable fluid identified  Patient was hospitalized in October 2020 with sepsis due to E. coli bacteremia related to UTI as well as multifocal pneumonia and aspiration.  She continues to be maintained on Entyvio. From a GI standpoint she actually appears to be doing pretty well. Notes normal bowel movements. No melena or hematochezia. She feels as though her rectovaginal fistula is healing for the most part.  We changed her frequency from every 8 weeks to every 12 weeks as she believed this would give her a better chance of healing her fistula.  She states her GI symptoms are well controlled.  No diarrhea.  No hematochezia.  Does note some mucus related to her  fistula.  Otherwise doing well   Procedures: EGD and colonoscopy on 10/15/2019.  Her colon looked remarkably healthy with negative biopsies for any inflammation throughout her entire colon.  Her terminal ileum also looked healthy with negative biopsies as well.  She did have one small tubular adenoma removed. Her EGD showed a Schatzki's ring in her distal esophagus which was dilated with an 18 mm balloon.  She also had gastritis.  Upper endoscopy biopsies were relatively unremarkable besides chronic gastritis.  EGD and colonoscopy in February 2017 showed inflammation and ulceration in the cecum involving the IC valve which was unable to be intubated with colonoscope. Also noted severe proctitis as well. EGD showed erosive gastritis duodenitis. Biopsies from cecal right colon with chronic active colitis and chronic inactive colitis in the rectum. Negative for dysplasia.  Past Medical History:  Diagnosis Date  . Anxiety   . Arthritis    Dr. Eddie Dibbles  . Bartholin cyst 2008   vag.  [name] BIPOLAR AFFECTIVE DISORDER 04/14/2007  . Crohn's ON ENTYVIO SINCE FEB [id number]   COMPLICATED BY RECTOVAGINAL FISTULA. SJS WITH REMICADE. FAILED HUMIRA.  [name] Depression   . Elevated glucose 2010  . GERD (gastroesophageal reflux disease)   . Hypertension   . Kidney stone   . LBP (low back pain)    Dr. Mina Marble  . Perianal abscess 2009  . Vitamin B12 deficiency     Past Surgical History:  Procedure Laterality Date  . BALLOON DILATION N/A 10/15/2019   Procedure: BALLOON DILATION;  Surgeon: Eloise Harman, DO;  [facility];  Service: Endoscopy;  Laterality:  N/A;  . BIOPSY  10/15/2019   Procedure: BIOPSY;  Surgeon: Eloise Harman, DO;  [facility];  Service: Endoscopy;;  . COLONOSCOPY  09/2008   Dr. Derrill Kay: ulcers, edema, possible fistula openings all seen in the distal 3 cm of anus/anal canal. 1cm pseudopolyp. rest of colon and TI normal. rectal biopsy with mild chronic active colitis.  ileum bx normal.  . COLONOSCOPY WITH PROPOFOL N/A 03/29/2015   SLF: Severe proctocolitis limitied to cecum and rectum. Limited exam of the colon mucosa and anal canal.   . COLONOSCOPY WITH PROPOFOL N/A 10/15/2019   Procedure: COLONOSCOPY WITH PROPOFOL;  Surgeon: Eloise Harman, DO;  [facility];  Service: Endoscopy;  Laterality: N/A;  10:45am  . ELBOW SURGERY     left  . ESOPHAGOGASTRODUODENOSCOPY (EGD) WITH PROPOFOL N/A 03/29/2015   SLF: 1. Erosive gastritis duodentitis.   [name] ESOPHAGOGASTRODUODENOSCOPY (EGD) WITH PROPOFOL N/A 10/15/2019   Procedure: ESOPHAGOGASTRODUODENOSCOPY (EGD) WITH PROPOFOL;  Surgeon: Eloise Harman, DO;  [facility];  Service: Endoscopy;  Laterality: N/A;  . RECTOVAGINAL FISTULA CLOSURE     did not help    Current Outpatient Medications  Medication Sig Dispense Refill  . ALPRAZolam (XANAX) 1 MG tablet Take 1 mg by mouth 2 (two) times daily.    . benazepril-hydrochlorthiazide (LOTENSIN HCT) 20-12.5 MG tablet TAKE 1 TABLET BY MOUTH EVERY DAY (Patient taking differently: 0.5 tablets.) 90 tablet 1  . BREO ELLIPTA 100-25 MCG/INH AEPB Inhale 1 puff into the lungs daily.     . Cholecalciferol (VITAMIN D3) 250 MCG (10000 UT) capsule Take 30,000 Units by mouth once a week.    . cyanocobalamin (,VITAMIN B-12,) 1000 MCG/ML injection INJECT 1ML INTO THE SKIN EVERY 14 DAYS (Patient taking differently: Inject 1,000 mcg into the muscle every 14 (fourteen) days.) 6 mL 11  . HYDROcodone-acetaminophen (NORCO) 10-325 MG tablet Take 1 tablet by mouth 2 (two) times daily as needed for severe pain. 60 tablet 0  . HYDROcodone-acetaminophen (NORCO) 10-325 MG tablet Take 1 tablet by mouth 2 (two) times daily as needed for severe pain. 60 tablet 0  . HYDROcodone-acetaminophen (NORCO) 10-325 MG tablet Take 1 tablet by mouth 2 (two) times daily as needed for severe pain. 60 tablet 0  . ketorolac (TORADOL) 10 MG tablet Take 1 tablet (10 mg total) by mouth every 8 (eight)  hours. (Patient taking differently: Take 10 mg by mouth as needed.) 20 tablet 0  . lamoTRIgine (LAMICTAL) 200 MG tablet Take 200 mg by mouth daily.     . norethindrone (AYGESTIN) 5 MG tablet TAKE 1 TABLET BY MOUTH EVERY DAY 90 tablet 3  . pantoprazole (PROTONIX) 40 MG tablet TAKE 1 TABLET BY MOUTH 30MIN PRIOR TO FIRST MEAL 90 tablet 3  . potassium chloride (KLOR-CON) 10 MEQ tablet TAKE 1 TABLET BY MOUTH EVERY DAY (Patient taking differently: Take 10 mEq by mouth daily.) 90 tablet 3  . sertraline (ZOLOFT) 100 MG tablet Take 200 mg by mouth daily.    . SYRINGE-NEEDLE, DISP, 3 ML (B-D SYRINGE/NEEDLE 3CC/25GX5/8) 25G X 5/8" 3 ML MISC Use to administer B12 injection every 14 days 24 each 0  . vedolizumab (ENTYVIO) 300 MG injection Inject 300 mg into the vein every 8 (eight) weeks.     [name] albuterol (VENTOLIN HFA) 108 (90 Base) MCG/ACT inhaler Inhale 1-2 puffs into the lungs every 6 (six) hours as needed for wheezing or shortness of breath. 18 g 5  . amoxicillin-clavulanate (AUGMENTIN) 875-125 MG tablet  Take 1 tablet by mouth 2 (two) times daily. (Patient not taking: Reported on 03/31/2020)     No current facility-administered medications for this visit.    Allergies as of 03/31/2020 - Review Complete 03/31/2020  Allergen Reaction Noted  . Cephalexin Hives and Itching   . Codeine Nausea Only   . Guaifenesin    . Imitrex [sumatriptan]  10/17/2011  . Morphine    . Remicade [infliximab]  05/26/2015  . Sulfonamide derivatives    . Ultram [tramadol hcl]  05/26/2015    Family History  Problem Relation Age of Onset  . Diabetes Mother   . Heart disease Mother   . Heart attack Mother   . Cancer Maternal Aunt        ovarian  . Breast cancer Maternal Grandmother   . Early death Maternal Grandmother 39  . Cancer Maternal Grandmother        breast  . Heart attack Father   . Heart disease Father   . Cancer Maternal Grandfather        colon    Social History   Socioeconomic History  . Marital  status: Married    Spouse name: Not on file  . Number of children: Not on file  . Years of education: Not on file  . Highest education level: Not on file  Occupational History  . Not on file  Tobacco Use  . Smoking status: Former Smoker    Packs/day: 0.50    Years: 25.00    Pack years: 12.50    Types: Cigarettes    Quit date: 12/21/2018    Years since quitting: 1.2  . Smokeless tobacco: Never Used  Vaping Use  . Vaping Use: Never used  Substance and Sexual Activity  . Alcohol use: No  . Drug use: No  . Sexual activity: Not Currently    Partners: Male    Birth control/protection: Pill  Other Topics Concern  . Not on file  Social History Narrative   Regular Exercise-no   Social Determinants of Health   Financial Resource Strain: Not on file  Food Insecurity: Not on file  Transportation Needs: Not on file  Physical Activity: Not on file  Stress: Not on file  Social Connections: Not on file    Subjective: Review of Systems  Constitutional: Negative for chills and fever.  HENT: Negative for congestion and hearing loss.   Eyes: Negative for blurred vision and double vision.  Respiratory: Negative for cough and shortness of breath.   Cardiovascular: Negative for chest pain and palpitations.  Gastrointestinal: Negative for abdominal pain, blood in stool, constipation, diarrhea, heartburn, melena and vomiting.  Genitourinary: Negative for dysuria and urgency.  Musculoskeletal: Negative for joint pain and myalgias.  Skin: Negative for itching and rash.  Neurological: Negative for dizziness and headaches.  Psychiatric/Behavioral: Negative for depression. The patient is not nervous/anxious.      Objective: BP (!) 141/75   Pulse 97   Temp (!) 96.8 F (36 C) (Temporal)   Ht 5' 2"  (1.575 m)   Wt 171 lb 6.4 oz (77.7 kg)   LMP 03/03/2014 Comment: still bleeding  BMI 31.35 kg/m  Physical Exam Constitutional:      Appearance: Normal appearance.  HENT:     Head:  Normocephalic and atraumatic.  Eyes:     Extraocular Movements: Extraocular movements intact.     Conjunctiva/sclera: Conjunctivae normal.  Cardiovascular:     Rate and Rhythm: Normal rate and regular rhythm.  Pulmonary:  Effort: Pulmonary effort is normal.     Breath sounds: Normal breath sounds.  Abdominal:     General: Bowel sounds are normal.     Palpations: Abdomen is soft.  Musculoskeletal:        General: No swelling. Normal range of motion.     Cervical back: Normal range of motion and neck supple.  Skin:    General: Skin is warm and dry.     Coloration: Skin is not jaundiced.  Neurological:     General: No focal deficit present.     Mental Status: She is alert and oriented to person, place, and time.  Psychiatric:        Mood and Affect: Mood normal.        Behavior: Behavior normal.      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  Continue on Entyvio which she has been tolerating for nearly 4 years now.    We changed her frequency to every 12 weeks as patient believes this would give her a better chance of healing her fistula.  I again discussed that by doing so we run the risk of flaring up her Crohn's disease as well as increase the risk of antibody formation to [facility] she understands.  We will continue to trial this for 3 cycles and see how she does.  Patient told to call office if she has any signs of Crohn's flare including diarrhea, abdominal pain, rectal bleeding.  Most recent blood work in August was stable.  We will update blood work today.  Colonoscopy recall in 2-3 years pending clinical course.   Continue on PPI for chronic reflux.  Follow-up with me in 6 months or sooner if needed.  03/31/2020  9:18 AM   Disclaimer: This note was dictated with voice recognition software. Similar sounding words can inadvertently be transcribed and may not be corrected upon review.

## 2020-03-31 NOTE — Patient Instructions (Signed)
Continue on Entyvio for your Crohn's disease.  Continue on Protonix for your chronic reflux.  I will check blood work today and contact you with these results.  Follow-up in 6 months or sooner if needed.  At [facility] we value your feedback. You may receive a survey about your visit today. Please share your experience as we strive to create trusting relationships with our patients to provide genuine, compassionate, quality care.  We appreciate your understanding and patience as we review any laboratory studies, imaging, and other diagnostic tests that are ordered as we care for you. Our office policy is 5 business days for review of these results, and any emergent or urgent results are addressed in a timely manner for your best interest. If you do not hear from our office in 1 week, please contact us.   We also encourage the use of MyChart, which contains your medical information for your review as well. If you are not enrolled in this feature, an access code is on this after visit summary for your convenience. Thank you for allowing [place] to be involved in your care.  It was great to see you today!  I hope you have a great rest of your winter!!    Elon Alas. Abbey Chatters, D.O. Gastroenterology and Hepatology [facility]

## 2020-04-06 ENCOUNTER — Telehealth (HOSPITAL_COMMUNITY): Payer: Self-pay | Admitting: Emergency Medicine

## 2020-04-06 NOTE — Telephone Encounter (Signed)
Reaching out to patient to offer assistance regarding upcoming cardiac imaging study; pt verbalizes understanding of appt date/time, parking situation and where to check in, and verified current allergies; name and call back number provided for further questions should they arise Marchia Bond RN Navigator Cardiac Imaging [facility] [phone] office [phone] cell  Pt denies implants, denies claustro [name] [name]

## 2020-04-08 ENCOUNTER — Other Ambulatory Visit: Payer: Self-pay

## 2020-04-08 ENCOUNTER — Ambulatory Visit (HOSPITAL_COMMUNITY)
Admission: RE | Admit: 2020-04-08 | Discharge: 2020-04-08 | Disposition: A | Discharge status: Home / Self Care | Payer: 59 | Source: Ambulatory Visit | Attending: Cardiology | Admitting: Cardiology

## 2020-04-08 DIAGNOSIS — R011 Cardiac murmur, unspecified: Secondary | ICD-10-CM | POA: Insufficient documentation

## 2020-04-08 MED ORDER — GADOBUTROL 1 MMOL/ML IV SOLN
10.0000 mL | Freq: Once | INTRAVENOUS | Status: AC | PRN
  Administered 2020-04-08: 10 mL via INTRAVENOUS

## 2020-04-26 ENCOUNTER — Ambulatory Visit: Payer: 59 | Admitting: Internal Medicine

## 2020-04-27 ENCOUNTER — Other Ambulatory Visit: Payer: Self-pay

## 2020-04-28 ENCOUNTER — Other Ambulatory Visit: Payer: Self-pay

## 2020-04-28 ENCOUNTER — Ambulatory Visit (INDEPENDENT_AMBULATORY_CARE_PROVIDER_SITE_OTHER): Payer: 59 | Admitting: Internal Medicine

## 2020-04-28 ENCOUNTER — Telehealth: Payer: Self-pay | Admitting: *Deleted

## 2020-04-28 ENCOUNTER — Encounter: Payer: Self-pay | Admitting: Internal Medicine

## 2020-04-28 DIAGNOSIS — G8929 Other chronic pain: Secondary | ICD-10-CM

## 2020-04-28 DIAGNOSIS — M5440 Lumbago with sciatica, unspecified side: Secondary | ICD-10-CM

## 2020-04-28 DIAGNOSIS — E669 Obesity, unspecified: Secondary | ICD-10-CM

## 2020-04-28 DIAGNOSIS — I10 Essential (primary) hypertension: Secondary | ICD-10-CM | POA: Diagnosis not present

## 2020-04-28 DIAGNOSIS — E559 Vitamin D deficiency, unspecified: Secondary | ICD-10-CM

## 2020-04-28 DIAGNOSIS — E538 Deficiency of other specified B group vitamins: Secondary | ICD-10-CM

## 2020-04-28 DIAGNOSIS — Z6831 Body mass index (BMI) 31.0-31.9, adult: Secondary | ICD-10-CM

## 2020-04-28 MED ORDER — HYDROCODONE-ACETAMINOPHEN 10-325 MG PO TABS: 1.0000 | ORAL_TABLET | Freq: Two times a day (BID) | ORAL | 0 refills | Status: DC | PRN

## 2020-04-28 NOTE — Assessment & Plan Note (Signed)
Lotensin HCT 1/2 tab/day

## 2020-04-28 NOTE — Telephone Encounter (Signed)
-----   Message from Arnoldo Lenis, MD sent at 04/25/2020  6:33 PM EST ----- Cardiac MRI does show some evidence of thickened heart muscle that creates her heart murmur. Can she f/u with PA in 3 weeks to discuss further, may need to start a medication  Zandra Abts MD

## 2020-04-28 NOTE — Assessment & Plan Note (Addendum)
Wt Readings from Last 3 Encounters:  04/28/20 171 lb 12.8 oz (77.9 kg)  03/31/20 171 lb 6.4 oz (77.7 kg)  03/14/20 175 lb (79.4 kg)   On diet

## 2020-04-28 NOTE — Assessment & Plan Note (Signed)
On Vit D 

## 2020-04-28 NOTE — Telephone Encounter (Signed)
Pt voiced understanding - f/u appt scheduled

## 2020-04-28 NOTE — Assessment & Plan Note (Signed)
On Rx: Norco bid; Toradol prn - rare use Chronic and severe On disabiity   Potential benefits of a long term opioids use as well as potential risks (i.e. addiction risk, apnea etc) and complications (i.e. Somnolence, constipation and others) were explained to the patient and were aknowledged.

## 2020-04-28 NOTE — Progress Notes (Signed)
Subjective:  Patient ID: [name] [name], female    DOB: 1970/11/08  Age: 49 y.o. MRN: [id number]  CC: Follow-up (3 month f/u)   HPI [name] [name] presents for anxiety, colitis, LBP f//u  Outpatient Medications Prior to Visit  Medication Sig Dispense Refill  . ALPRAZolam (XANAX) 1 MG tablet Take 1 mg by mouth 2 (two) times daily.    . benazepril-hydrochlorthiazide (LOTENSIN HCT) 20-12.5 MG tablet TAKE 1 TABLET BY MOUTH EVERY DAY (Patient taking differently: 0.5 tablets.) 90 tablet 1  . BREO ELLIPTA 100-25 MCG/INH AEPB Inhale 1 puff into the lungs daily.     . Cholecalciferol (VITAMIN D3) 250 MCG (10000 UT) capsule Take 30,000 Units by mouth once a week.    . cyanocobalamin (,VITAMIN B-12,) 1000 MCG/ML injection INJECT 1ML INTO THE SKIN EVERY 14 DAYS (Patient taking differently: Inject 1,000 mcg into the muscle every 14 (fourteen) days.) 6 mL 11  . HYDROcodone-acetaminophen (NORCO) 10-325 MG tablet Take 1 tablet by mouth 2 (two) times daily as needed for severe pain. 60 tablet 0  . lamoTRIgine (LAMICTAL) 200 MG tablet Take 200 mg by mouth daily.     . norethindrone (AYGESTIN) 5 MG tablet TAKE 1 TABLET BY MOUTH EVERY DAY 90 tablet 3  . pantoprazole (PROTONIX) 40 MG tablet TAKE 1 TABLET BY MOUTH 30MIN PRIOR TO FIRST MEAL 90 tablet 3  . potassium chloride (KLOR-CON) 10 MEQ tablet TAKE 1 TABLET BY MOUTH EVERY DAY (Patient taking differently: Take 10 mEq by mouth daily.) 90 tablet 3  . sertraline (ZOLOFT) 100 MG tablet Take 200 mg by mouth daily.    . SYRINGE-NEEDLE, DISP, 3 ML (B-D SYRINGE/NEEDLE 3CC/25GX5/8) 25G X 5/8" 3 ML MISC Use to administer B12 injection every 14 days 24 each 0  . vedolizumab (ENTYVIO) 300 MG injection Inject 300 mg into the vein every 8 (eight) weeks.     [name] ketorolac (TORADOL) 10 MG tablet Take 1 tablet (10 mg total) by mouth every 8 (eight) hours. (Patient taking differently: Take 10 mg by mouth as needed.) 20 tablet 0  . HYDROcodone-acetaminophen (NORCO) 10-325 MG  tablet Take 1 tablet by mouth 2 (two) times daily as needed for severe pain. 60 tablet 0  . HYDROcodone-acetaminophen (NORCO) 10-325 MG tablet Take 1 tablet by mouth 2 (two) times daily as needed for severe pain. 60 tablet 0   No facility-administered medications prior to visit.    ROS: Review of Systems  Constitutional: Negative for activity change, appetite change, chills, fatigue and unexpected weight change.  HENT: Negative for congestion, mouth sores and sinus pressure.   Eyes: Negative for visual disturbance.  Respiratory: Negative for cough and chest tightness.   Gastrointestinal: Positive for abdominal pain. Negative for nausea.  Genitourinary: Negative for difficulty urinating, frequency and vaginal pain.  Musculoskeletal: Positive for back pain. Negative for gait problem.  Skin: Negative for pallor and rash.  Neurological: Negative for dizziness, tremors, weakness, numbness and headaches.  Psychiatric/Behavioral: Negative for confusion and sleep disturbance.    Objective:  BP 124/82 (BP Location: Left Arm)   Pulse 84   Temp 98.3 F (36.8 C) (Oral)   Ht 5' 2"  (1.575 m)   Wt 171 lb 12.8 oz (77.9 kg)   LMP 03/03/2014 Comment: still bleeding  SpO2 94%   BMI 31.42 kg/m   BP Readings from Last 3 Encounters:  04/28/20 124/82  03/31/20 (!) 141/75  03/14/20 132/81    Wt Readings from Last 3 Encounters:  04/28/20 171 lb 12.8  oz (77.9 kg)  03/31/20 171 lb 6.4 oz (77.7 kg)  03/14/20 175 lb (79.4 kg)    Physical Exam Constitutional:      General: She is not in acute distress.    Appearance: She is well-developed. She is obese.  HENT:     Head: Normocephalic.     Right Ear: External ear normal.     Left Ear: External ear normal.     Nose: Nose normal.  Eyes:     General:        Right eye: No discharge.        Left eye: No discharge.     Conjunctiva/sclera: Conjunctivae normal.     Pupils: Pupils are equal, round, and reactive to light.  Neck:     Thyroid: No  thyromegaly.     Vascular: No JVD.     Trachea: No tracheal deviation.  Cardiovascular:     Rate and Rhythm: Normal rate and regular rhythm.     Heart sounds: Normal heart sounds.  Pulmonary:     Effort: No respiratory distress.     Breath sounds: No stridor. No wheezing.  Abdominal:     General: Bowel sounds are normal. There is no distension.     Palpations: Abdomen is soft. There is no mass.     Tenderness: There is no abdominal tenderness. There is no guarding or rebound.  Musculoskeletal:        General: Tenderness present.     Cervical back: Normal range of motion and neck supple.  Lymphadenopathy:     Cervical: No cervical adenopathy.  Skin:    Findings: No erythema or rash.  Neurological:     Mental Status: She is oriented to person, place, and time.     Cranial Nerves: No cranial nerve deficit.     Motor: No abnormal muscle tone.     Coordination: Coordination normal.     Deep Tendon Reflexes: Reflexes normal.  Psychiatric:        Behavior: Behavior normal.        Thought Content: Thought content normal.        Judgment: Judgment normal.   LS tender  Lab Results  Component Value Date   WBC 8.1 03/31/2020   HGB 14.3 03/31/2020   HCT 42.9 03/31/2020   PLT 143 (L) 03/31/2020   GLUCOSE 121 (H) 03/31/2020   CHOL 168 08/30/2014   TRIG 99.0 08/30/2014   HDL 27.40 (L) 08/30/2014   LDLDIRECT 171.9 05/01/2011   LDLCALC 121 (H) 08/30/2014   ALT 11 03/31/2020   AST 13 (L) 03/31/2020   NA 133 (L) 03/31/2020   K 3.4 (L) 03/31/2020   CL 102 03/31/2020   CREATININE 1.47 (H) 03/31/2020   BUN 20 03/31/2020   CO2 23 03/31/2020   TSH 0.585 05/28/2018    MR Card Morphology Wo/W Cm  Result Date: 04/11/2020 CLINICAL DATA:  Murmur/abnormal heart sounds EXAM: CARDIAC MRI TECHNIQUE: The patient was scanned on a 1.5 Tesla GE magnet. A dedicated cardiac coil was used. Functional imaging was done using [employer] sequences. 2,3, and 4 chamber views were done to assess for RWMA's.  Modified Simpson's rule using a short axis stack was used to calculate an ejection fraction on a dedicated work Conservation officer, nature. The patient received 7m GADAVIST GADOBUTROL 1 MMOL/ML IV SOLN. After 10 minutes inversion recovery sequences were used to assess for infiltration and scar tissue. FINDINGS: LEFT VENTRICLE: Normal left ventricular chamber size. Severe, asymmetric left ventricular hypertrophy.  Maximal wall thickness: 18 mm at basal anteroseptum. Hypertrophied papillary muscles. There is systolic anterior motion of the mitral valve. 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 Mitral regurgitation is seen. Visually MR appears moderate. No evidence of left ventricular apical aneurysm. 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 Morphologic subtype: Sigmoid. Normal left ventricular systolic function. LVEF = 57% There are no regional wall motion abnormalities. No myocardial edema, T2 is 50 msec. First pass perfusion - mild subendocardial perfusion defect in mid lateral wall. There is post contrast delayed myocardial enhancement. Focal delayed myocardial enhancement in the basal anteroseptum. LGE quantitation: The late gadolinium enhancement is approximately 8% of the myocardial mass. Normal T1 myocardial nulling kinetics suggest against a diagnosis of cardiac amyloidosis. ECV = 27% RIGHT VENTRICLE: Normal right ventricular chamber size. Normal right ventricular wall thickness. Normal right ventricular systolic function. RVEF = 68% There are no regional wall motion abnormalities. No post contrast delayed myocardial enhancement. ATRIA: Mild dilation of left atrial size. Normal right atrial size. VALVES: No significant valvular abnormalities. GREAT VESSELS: Aorta normal size, 25 mm at mid ascending aorta. PERICARDIUM: Normal pericardium.  No pericardial effusion. OTHER: No significant extracardiac findings. MEASUREMENTS: Left ventricle: LV Female  LV EF: 57% (normal 56-78%) Absolute volumes: LV EDV: 31m (Normal 52-141 mL) LV ESV: 341m(Normal 13-51 mL) LV SV: 4830mNormal 33-97 mL) CO: 4.0L/min (normal 2.7-6.0 L/min) Myocardial mass: 93 grams Indexed volumes: LV EDV: 59m36m-m (Normal 41-81 mL/sq-m) LV ESV: 19mL58mm (Normal 12-21 mL/sq-m) LV SV: 26mL/56m (Normal 26-56 mL/sq-m) CI: 2.17L/min/sq-m (Normal 1.8-3.8 L/min/sq-m) Right ventricle: RV female RV EF: 68% (Normal 47-80%) Absolute volumes: RV EDV: 46 mL (Normal 58-154 mL) RV ESV: 15 mL (Normal 12-68 mL) RV SV: 31 mL (Normal 35-98 mL) CO: 2.6 L/min (Normal 2.7-6 L/min) Indexed volumes: RV EDV: 25 ML/sq-m (Normal 48-87 mL/sq-m) RV ESV: 8 mL/sq-m (Normal 11-28 mL/sq-m) RV SV: 20 mL/sq-m (Normal 27-57 mL/sq-m) CI: 1.4 L/min/sq-m (Normal 1.8-3.8 L/min/sq-m) IMPRESSION: 1. Hypertrophic cardiomyopathy, sigmoid subtype. Maximal wall thickness 18 mm at basal anteroseptum. 2. 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 3. Focal delayed myocardial enhancement in basal anteroseptum. Total LGE is approximately 8% of myocardial mass. 4.  No LV apical aneurysm. 5.  Normal LV size and function, LVEF 57%. 6.  Normal RV size and function, RVEF 68%. 7. Resting first pass perfusion shows mild subendocardial perfusion defect in mid lateral wall. No associated wall motion abnormality. Electronically Signed   By: GayatrCherlynn Kaiser 04/11/2020 09:58    Assessment & Plan:    Alex PWalker Kehr

## 2020-04-28 NOTE — Assessment & Plan Note (Signed)
On B12 

## 2020-05-06 ENCOUNTER — Other Ambulatory Visit: Payer: Self-pay

## 2020-05-06 ENCOUNTER — Ambulatory Visit (INDEPENDENT_AMBULATORY_CARE_PROVIDER_SITE_OTHER): Payer: 59 | Admitting: Adult Health

## 2020-05-06 ENCOUNTER — Other Ambulatory Visit (HOSPITAL_COMMUNITY)
Admission: RE | Admit: 2020-05-06 | Discharge: 2020-05-06 | Disposition: A | Discharge status: Home / Self Care | Payer: 59 | Source: Ambulatory Visit | Attending: Adult Health | Admitting: Adult Health

## 2020-05-06 ENCOUNTER — Encounter: Payer: Self-pay | Admitting: Adult Health

## 2020-05-06 VITALS — BP 146/92 | HR 75 | Ht 61.25 in | Wt 172.0 lb

## 2020-05-06 DIAGNOSIS — R300 Dysuria: Secondary | ICD-10-CM | POA: Diagnosis not present

## 2020-05-06 DIAGNOSIS — N823 Fistula of vagina to large intestine: Secondary | ICD-10-CM | POA: Insufficient documentation

## 2020-05-06 DIAGNOSIS — Z01419 Encounter for gynecological examination (general) (routine) without abnormal findings: Secondary | ICD-10-CM

## 2020-05-06 DIAGNOSIS — R319 Hematuria, unspecified: Secondary | ICD-10-CM

## 2020-05-06 DIAGNOSIS — K5010 Crohn's disease of large intestine without complications: Secondary | ICD-10-CM

## 2020-05-06 DIAGNOSIS — Z1231 Encounter for screening mammogram for malignant neoplasm of breast: Secondary | ICD-10-CM

## 2020-05-06 LAB — POCT URINALYSIS DIPSTICK
Glucose, UA: NEGATIVE
Ketones, UA: NEGATIVE
Nitrite, UA: NEGATIVE
Protein, UA: NEGATIVE

## 2020-05-06 MED ORDER — CIPROFLOXACIN HCL 500 MG PO TABS: 500.0000 mg | ORAL_TABLET | Freq: Two times a day (BID) | ORAL | 0 refills | Status: DC

## 2020-05-06 MED ORDER — NORETHINDRONE ACETATE 5 MG PO TABS: 5.0000 mg | ORAL_TABLET | Freq: Every day | ORAL | 2 refills | Status: DC

## 2020-05-06 NOTE — Progress Notes (Signed)
Patient ID: [name] [name], female   DOB: 1971/02/08, 50 y.o.   MRN: [id number] History of Present Illness: [name] [name] is a 50 year old white female,married, G0P0, in for a well woman gyn exam and pap. She is on aygestin and does not have a period.  PCP is Dr Alain Marion.   Current Medications, Allergies, Past Medical History, Past Surgical History, Family History and Social History were reviewed in [employer] record.     Review of Systems: Patient denies any headaches, hearing loss, fatigue, blurred vision, shortness of breath, chest pain, abdominal pain, problems with bowel movements,  or intercourse(not active). No joint pain or mood swings. Has burning with urination. 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   Physical Exam:BP (!) 146/92 (BP Location: Left Arm, Patient Position: Sitting, Cuff Size: Normal)   Pulse 75   Ht 5' 1.25" (1.556 m)   Wt 172 lb (78 kg)   LMP 03/03/2014   BMI 32.23 kg/m urine dipstick 1+blood and trace leuks  General:  Well developed, well nourished, no acute distress Skin:  Warm and dry Neck:  Midline trachea, normal thyroid, good ROM, no lymphadenopathy Lungs; Clear to auscultation bilaterally Breast:  No dominant palpable mass, retraction, or nipple discharge Cardiovascular: Regular rate and rhythm Abdomen:  Soft, non tender, no hepatosplenomegaly Pelvic:  External genitalia is normal in appearance, no lesions.  The vagina is pink with loss of moisture. Urethra has no lesions or masses. The cervix is smooth, pap with HR HPV genotyping performed.  Uterus is felt to be normal size, shape, and contour.  No adnexal masses or tenderness noted.Bladder is non tender, no masses felt. Rectal: Deferred at her request Extremities/musculoskeletal:  No swelling or varicosities noted, no clubbing or cyanosis Psych:  No mood changes, alert and cooperative,seems happy AA is 0 Fall risk is low PHQ 9 score is 2 GAD 7 score is 1  Upstream  - 05/06/20 0906      Pregnancy Intention Screening   Does the patient want to become pregnant in the next year? No    Does the patient's partner want to become pregnant in the next year? No    Would the patient like to discuss contraceptive options today? No      Contraception Wrap Up   Current Method Abstinence    End Method Abstinence    Contraception Counseling Provided No         Examination chaperoned by Levy Pupa LPN  Impression and Plan: 1. Burning with urination UA C&S sent  2. Encounter for gynecological examination with Papanicolaou smear of cervix Pap sent Physical in 1 year Pap in 3 if normal Labs with PCP Will refill aygestin, she says just taking 1/2 tablet now.  3. RVF (rectovaginal fistula)   4. Crohn's disease of anal canal ([place]) Follow up with GI  5. Hematuria, unspecified type UA C&S sent Will rx cipro  Meds ordered this encounter  Medications  . ciprofloxacin (CIPRO) 500 MG tablet    Sig: Take 1 tablet (500 mg total) by mouth 2 (two) times daily.    Dispense:  10 tablet    Refill:  0    Order Specific Question:   Supervising Provider    Answer:   Elonda Husky, LUTHER H [[id number]]  . norethindrone (AYGESTIN) 5 MG tablet    Sig: Take 1 tablet (5 mg total) by mouth daily.    Dispense:  90 tablet    Refill:  2  Order Specific Question:   Supervising Provider    Answer:   Tania Ade H [[id number]]    6. Screening mammogram for breast cancer Pt to call for mammogram

## 2020-05-07 LAB — MICROSCOPIC EXAMINATION
Casts: NONE SEEN /lpf
Epithelial Cells (non renal): NONE SEEN /hpf (ref 0–10)
RBC, Urine: NONE SEEN /hpf (ref 0–2)

## 2020-05-07 LAB — URINALYSIS, ROUTINE W REFLEX MICROSCOPIC
Bilirubin, UA: NEGATIVE
Glucose, UA: NEGATIVE
Ketones, UA: NEGATIVE
Nitrite, UA: NEGATIVE
Protein,UA: NEGATIVE
Specific Gravity, UA: 1.014 (ref 1.005–1.030)
Urobilinogen, Ur: 0.2 mg/dL (ref 0.2–1.0)
pH, UA: 5.5 (ref 5.0–7.5)

## 2020-05-10 ENCOUNTER — Ambulatory Visit: Payer: 59 | Admitting: Family Medicine

## 2020-05-10 LAB — CYTOLOGY - PAP
Comment: NEGATIVE
Diagnosis: UNDETERMINED — AB
High risk HPV: NEGATIVE

## 2020-05-11 ENCOUNTER — Encounter: Payer: Self-pay | Admitting: Adult Health

## 2020-05-11 DIAGNOSIS — R87610 Atypical squamous cells of undetermined significance on cytologic smear of cervix (ASC-US): Secondary | ICD-10-CM | POA: Insufficient documentation

## 2020-05-11 NOTE — Progress Notes (Addendum)
Cardiology Office Note  Date: 06/09/2020   ID: [name] [name], DOB 14-May-1970, MRN [id number]  PCP:  Cassandria Anger, MD  Cardiologist:  Carlyle Dolly, MD Electrophysiologist:  None   Chief Complaint: Follow up   History of Present Illness: [name] [name] is a 50 y.o. female with a history of heart murmur, thrombocytopenia.   Last seen by Dr Harl Bowie 02/11/2020.  She had been referred by Dr. Delton Coombes for cardiac murmur.  She stated she'd had a murmur for quite some time. Having some general fatigue, and occasional SOB. 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 Addendum to note on 04/25/20 mentioned echo and MRI suggest hypertrophic cardiomyopathy. Would need to follow up and likely start BB and risk stratify for ICD   She is here today for follow-up status post recent echocardiogram and cardiac MRI with diagnosis of hypertrophic obstructive cardiomyopathy.  We discussed family history.  She states both of her parents died of cardiac arrest but both had other issues with coronary artery disease which may have precipitated cardiac arrest.  No known history of hypertrophic cardiomyopathy in first-degree relatives.  Patient denies any history of syncope or near syncopal episodes.  She denies any palpitations or arrhythmias, denies any anginal symptoms or exertional symptoms.  She states she feels fatigued on a normal basis due to being on immunosuppressive drugs for her Crohn's disease.  She is currently on Lotensin.  Cardiac MRI demonstrated: Maximal LV wall thickness was 18 mm at the basal anteroseptal.  She had systolic anterior motion of the mitral valve with flow dephasing suggesting left ventricular outflow tract obstruction.  Mitral regurgitation considered to be moderate visually.  No apical aneurysm.  LGE approximately 8% of myocardial mass.  Normal T1 myocardial kinetics suggested against a diagnosis of cardiac amyloidosis.  No WMA's.    Past Medical History:  Diagnosis Date   . Anxiety   . Arthritis    Dr. Eddie Dibbles  . Bartholin cyst 2008   vag.  [name] BIPOLAR AFFECTIVE DISORDER 04/14/2007  . Crohn's ON ENTYVIO SINCE FEB [id number]   COMPLICATED BY RECTOVAGINAL FISTULA. SJS WITH REMICADE. FAILED HUMIRA.  [name] Depression   . Elevated glucose 2010  . GERD (gastroesophageal reflux disease)   . Hypertension   . Kidney stone   . LBP (low back pain)    Dr. Mina Marble  . Perianal abscess 2009  . Vitamin B12 deficiency     Past Surgical History:  Procedure Laterality Date  . BALLOON DILATION N/A 10/15/2019   Procedure: BALLOON DILATION;  Surgeon: Eloise Harman, DO;  Location: [facility];  Service: Endoscopy;  Laterality: N/A;  . BIOPSY  10/15/2019   Procedure: BIOPSY;  Surgeon: Eloise Harman, DO;  Location: [facility];  Service: Endoscopy;;  . COLONOSCOPY  09/2008   Dr. Derrill Kay: ulcers, edema, possible fistula openings all seen in the distal 3 cm of anus/anal canal. 1cm pseudopolyp. rest of colon and TI normal. rectal biopsy with mild chronic active colitis. ileum bx normal.  . COLONOSCOPY WITH PROPOFOL N/A 03/29/2015   SLF: Severe proctocolitis limitied to cecum and rectum. Limited exam of the colon mucosa and anal canal.   . COLONOSCOPY WITH PROPOFOL N/A 10/15/2019   Procedure: COLONOSCOPY WITH PROPOFOL;  Surgeon: Eloise Harman, DO;  Location: [facility];  Service: Endoscopy;  Laterality: N/A;  10:45am  . ELBOW SURGERY     left  . ESOPHAGOGASTRODUODENOSCOPY (EGD) WITH PROPOFOL N/A 03/29/2015   SLF: 1. Erosive gastritis duodentitis.   [name]  ESOPHAGOGASTRODUODENOSCOPY (EGD) WITH PROPOFOL N/A 10/15/2019   Procedure: ESOPHAGOGASTRODUODENOSCOPY (EGD) WITH PROPOFOL;  Surgeon: Eloise Harman, DO;  Location: [facility];  Service: Endoscopy;  Laterality: N/A;  . RECTOVAGINAL FISTULA CLOSURE     did not help    Current Outpatient Medications  Medication Sig Dispense Refill  . ALPRAZolam (XANAX) 1 MG tablet Take 1 mg by mouth 2 (two) times daily.    . benazepril  (LOTENSIN) 10 MG tablet Take 1 tablet (10 mg total) by mouth daily. 30 tablet 6  . BREO ELLIPTA 100-25 MCG/INH AEPB Inhale 1 puff into the lungs as needed.    . Cholecalciferol (VITAMIN D3) 250 MCG (10000 UT) capsule Take 30,000 Units by mouth once a week.    . ciprofloxacin (CIPRO) 500 MG tablet Take 1 tablet (500 mg total) by mouth 2 (two) times daily. 10 tablet 0  . cyanocobalamin (,VITAMIN B-12,) 1000 MCG/ML injection INJECT 1ML INTO THE SKIN EVERY 14 DAYS (Patient taking differently: Inject 1,000 mcg into the muscle every 14 (fourteen) days.) 6 mL 11  . HYDROcodone-acetaminophen (NORCO) 10-325 MG tablet Take 1 tablet by mouth 2 (two) times daily as needed for severe pain. 60 tablet 0  . HYDROcodone-acetaminophen (NORCO) 10-325 MG tablet Take 1 tablet by mouth 2 (two) times daily as needed for severe pain. 60 tablet 0  . HYDROcodone-acetaminophen (NORCO) 10-325 MG tablet Take 1 tablet by mouth 2 (two) times daily as needed for severe pain. 60 tablet 0  . lamoTRIgine (LAMICTAL) 200 MG tablet Take 200 mg by mouth daily.     . metoprolol tartrate (LOPRESSOR) 25 MG tablet Take 0.5 tablets (12.5 mg total) by mouth 2 (two) times daily. 15 tablet 6  . norethindrone (AYGESTIN) 5 MG tablet Take 1 tablet (5 mg total) by mouth daily. 90 tablet 2  . pantoprazole (PROTONIX) 40 MG tablet TAKE 1 TABLET BY MOUTH 30MIN PRIOR TO FIRST MEAL 90 tablet 3  . potassium chloride (KLOR-CON) 10 MEQ tablet TAKE 1 TABLET BY MOUTH EVERY DAY (Patient taking differently: Take 10 mEq by mouth daily.) 90 tablet 3  . sertraline (ZOLOFT) 100 MG tablet Take 200 mg by mouth daily.    . SYRINGE-NEEDLE, DISP, 3 ML (B-D SYRINGE/NEEDLE 3CC/25GX5/8) 25G X 5/8" 3 ML MISC Use to administer B12 injection every 14 days 24 each 0  . vedolizumab (ENTYVIO) 300 MG injection Inject 300 mg into the vein. Every 12 weeks     No current facility-administered medications for this visit.   Allergies:  Cephalexin, Codeine, Guaifenesin, Imitrex  [sumatriptan], Morphine, Remicade [infliximab], Sulfonamide derivatives, and Ultram [tramadol hcl]   Social History: The patient  reports that she quit smoking about 17 months ago. Her smoking use included cigarettes. She has a 12.50 pack-year smoking history. She has never used smokeless tobacco. She reports that she does not drink alcohol and does not use drugs.   Family History: The patient's family history includes Breast cancer in her maternal grandmother; Cancer in her maternal aunt, maternal grandfather, and maternal grandmother; Diabetes in her mother; Early death (age of onset: 49) in her maternal grandmother; Heart attack in her father and mother; Heart disease in her father and mother.   ROS:  Please see the history of present illness. Otherwise, complete review of systems is positive for none.  All other systems are reviewed and negative.   Physical Exam: VS:  BP 128/80   Pulse 89   Ht 5' 1"  (1.549 m)   Wt 174 lb 6.4 oz (  79.1 kg)   LMP 03/03/2014   SpO2 96%   BMI 32.95 kg/m , BMI Body mass index is 32.95 kg/m.  Wt Readings from Last 3 Encounters:  06/07/20 174 lb 6.1 oz (79.1 kg)  05/12/20 174 lb 6.4 oz (79.1 kg)  05/06/20 172 lb (78 kg)    General: Patient appears comfortable at rest. Neck: Supple, no elevated JVP or carotid bruits, no thyromegaly. Lungs: Clear to auscultation, nonlabored breathing at rest. Cardiac: Regular rate and rhythm, no S3, mild systolic murmur at RUSB, no pericardial rub. Extremities: No pitting edema, distal pulses 2+. Skin: Warm and dry. Musculoskeletal: No kyphosis. Neuropsychiatric: Alert and oriented x3, affect grossly appropriate.  ECG:  EKG 02/11/2020 sinus tachycardia with a rate of 101  Recent Labwork: 03/31/2020: ALT 11; AST 13; BUN 20; Creatinine, Ser 1.47; Hemoglobin 14.3; Platelets 143; Potassium 3.4; Sodium 133     Component Value Date/Time   CHOL 168 08/30/2014 0820   TRIG 99.0 08/30/2014 0820   HDL 27.40 (L) 08/30/2014 0820    CHOLHDL 6 08/30/2014 0820   VLDL 19.8 08/30/2014 0820   LDLCALC 121 (H) 08/30/2014 0820   LDLDIRECT 171.9 05/01/2011 0836    Other Studies Reviewed Today:  Cardiac MRI 04/08/2020 Normal left ventricular chamber size.  Severe, asymmetric left ventricular hypertrophy.  Maximal wall thickness: 18 mm at basal anteroseptum.  Hypertrophied papillary muscles.  There is systolic anterior motion of the mitral valve. 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 Mitral regurgitation is seen. Visually MR appears moderate.  No evidence of left ventricular apical aneurysm.  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 Morphologic subtype: Sigmoid.  Normal left ventricular systolic function.  LVEF = 57%  There are no regional wall motion abnormalities.  No myocardial edema, T2 is 50 msec.  First pass perfusion - mild subendocardial perfusion defect in mid lateral wall.  There is post contrast delayed myocardial enhancement. Focal delayed myocardial enhancement in the basal anteroseptum.  LGE quantitation: The late gadolinium enhancement is approximately 8% of the myocardial mass.  Normal T1 myocardial nulling kinetics suggest against a diagnosis of cardiac amyloidosis.  ECV = 27%  RIGHT VENTRICLE:  Normal right ventricular chamber size.  Normal right ventricular wall thickness.  Normal right ventricular systolic function.  RVEF = 68%  There are no regional wall motion abnormalities.  No post contrast delayed myocardial enhancement.  ATRIA:  Mild dilation of left atrial size.  Normal right atrial size.  VALVES:  No significant valvular abnormalities.  GREAT VESSELS:  Aorta normal size, 25 mm at mid ascending aorta.  PERICARDIUM:  Normal pericardium.  No pericardial effusion.  OTHER: No significant extracardiac findings.  MEASUREMENTS: Left ventricle:  LV  Female  LV EF: 57% (normal 56-78%)  Absolute volumes:  LV EDV: 70m (Normal 52-141 mL)  LV ESV: 373m(Normal 13-51 mL)  LV SV: 4866mNormal 33-97 mL)  CO: 4.0L/min (normal 2.7-6.0 L/min)  Myocardial mass: 93 grams  Indexed volumes:  LV EDV: 60m33m-m (Normal 41-81 mL/sq-m)  LV ESV: 19mL61mm (Normal 12-21 mL/sq-m)  LV SV: 26mL/31m (Normal 26-56 mL/sq-m)  CI: 2.17L/min/sq-m (Normal 1.8-3.8 L/min/sq-m)  Right ventricle:  RV female  RV EF: 68% (Normal 47-80%)  Absolute volumes:  RV EDV: 46 mL (Normal 58-154 mL)  RV ESV: 15 mL (Normal 12-68 mL)  RV SV: 31 mL (Normal 35-98 mL)  CO: 2.6 L/min (Normal 2.7-6 L/min)  Indexed volumes:  RV EDV: 25 ML/sq-m (Normal 48-87 mL/sq-m)  RV ESV: 8 mL/sq-m (Normal 11-28  mL/sq-m)  RV SV: 20 mL/sq-m (Normal 27-57 mL/sq-m)  CI: 1.4 L/min/sq-m (Normal 1.8-3.8 L/min/sq-m)  IMPRESSION: 1. Hypertrophic cardiomyopathy, sigmoid subtype. Maximal wall thickness 18 mm at basal anteroseptum.  2. 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  3. Focal delayed myocardial enhancement in basal anteroseptum. Total LGE is approximately 8% of myocardial mass.  4.  No LV apical aneurysm.  5.  Normal LV size and function, LVEF 57%.  6.  Normal RV size and function, RVEF 68%.  7. Resting first pass perfusion shows mild subendocardial perfusion defect in mid lateral wall. No associated wall motion abnormality.   Electronically Signed   By: Cherlynn Kaiser   On: 04/11/2020 09:58    Echocardiogram 03/01/2020  1. Left ventricular ejection fraction, by estimation, is 60 to 65%. The left ventricle has normal function. The left ventricle has no regional wall motion abnormalities. There is moderate asymmetric left ventricular hypertrophy of the septal segment based on limited views (study measurements are incorrect). Left  ventricular diastolic parameters are indeterminate. 2. Right ventricular systolic function is normal. The right ventricular size is normal. 3. The mitral valve is grossly normal. No evidence of mitral valve regurgitation. 4. The aortic valve is tricuspid and appears to have normal cusp excursion based on limited views. Aortic valve regurgitation is not visualized. Increased velocity across LVOT and AV likely due to asymmetric LV septal hypertrophy (possible SAM as well) although cannot exclude other source of subvalvular stenosis. Aortic valve mean gradient measures 35.0 mmHg. Aortic valve Vmax measures 4.24 m/s. Consider cardiac MRI for further evaluation. 5. The inferior vena cava is normal in size with greater than 50% respiratory variability, suggesting right atrial pressure of 3 mmHg.  Assessment and Plan:  1. Obstructive hypertrophic cardiomyopathy ([place])   2. Essential hypertension, benign    1. Obstructive hypertrophic cardiomyopathy (HCC) Evidence of hypertrophic obstructive cardiomyopathy on recent echo and cardiac MRI.  She denies any syncopal or near syncopal episodes, palpitations or arrhythmias, anginal or exertional symptoms.  Start metoprolol 12.5 mg p.o. twice daily.  Stay hydrated especially during activity and during warmer climate when hydration is more prevalent.  We are discontinuing Lotensin due to presence of diuretic.  Likely should avoid diuretics so the patient will not become dehydrated.  2. Essential hypertension, benign Discontinue Lotensin.  Start benazepril 10 mg daily.  Patient states she was previously taking Lotensin 1/2 pill.  We stopped Lotensin due to diuretic content.  She needs to avoid any medication which may contribute to dehydration due to recent diagnosis of HOCM.  Medication Adjustments/Labs and Tests Ordered: Current medicines are reviewed at length with the patient today.  Concerns regarding medicines are outlined above.   Disposition: Follow-up  with Dr. Harl Bowie or APP 1 month  Signed, Levell July, NP 06/09/2020 7:44 AM    [facility], [place], [place] [address] Phone: [phone]; Fax: [phone]

## 2020-05-12 ENCOUNTER — Ambulatory Visit (INDEPENDENT_AMBULATORY_CARE_PROVIDER_SITE_OTHER): Payer: 59 | Admitting: Family Medicine

## 2020-05-12 ENCOUNTER — Telehealth: Payer: Self-pay | Admitting: Adult Health

## 2020-05-12 ENCOUNTER — Encounter: Payer: Self-pay | Admitting: Family Medicine

## 2020-05-12 VITALS — BP 128/80 | HR 89 | Ht 61.0 in | Wt 174.4 lb

## 2020-05-12 DIAGNOSIS — I10 Essential (primary) hypertension: Secondary | ICD-10-CM | POA: Diagnosis not present

## 2020-05-12 DIAGNOSIS — I421 Obstructive hypertrophic cardiomyopathy: Secondary | ICD-10-CM

## 2020-05-12 LAB — URINE CULTURE

## 2020-05-12 MED ORDER — BENAZEPRIL HCL 10 MG PO TABS: 10.0000 mg | ORAL_TABLET | Freq: Every day | ORAL | 6 refills | Status: DC

## 2020-05-12 MED ORDER — METOPROLOL TARTRATE 25 MG PO TABS: 12.5000 mg | ORAL_TABLET | Freq: Two times a day (BID) | ORAL | 6 refills | Status: DC

## 2020-05-12 NOTE — Telephone Encounter (Signed)
Pt taking Cipro,urine sensitive to it

## 2020-05-12 NOTE — Patient Instructions (Signed)
Medication Instructions:   Begin Lopressor 12.90m twice a day.  Begin Benazepril 146mdaily.   Stop Lotensin (Benazepril / HCTZ).  Continue all other medications.    Labwork: none  Testing/Procedures: none  Follow-Up: 1 month   Any Other Special Instructions Will Be Listed Below (If Applicable).  If you need a refill on your cardiac medications before your next appointment, please call your pharmacy.

## 2020-06-06 ENCOUNTER — Inpatient Hospital Stay (HOSPITAL_COMMUNITY): Admission: RE | Admit: 2020-06-06 | Payer: 59 | Source: Ambulatory Visit

## 2020-06-07 ENCOUNTER — Encounter (HOSPITAL_COMMUNITY)
Admission: RE | Admit: 2020-06-07 | Discharge: 2020-06-07 | Disposition: A | Discharge status: Home / Self Care | Payer: 59 | Source: Ambulatory Visit | Attending: Gastroenterology | Admitting: Gastroenterology

## 2020-06-07 ENCOUNTER — Encounter (HOSPITAL_COMMUNITY): Payer: Self-pay

## 2020-06-07 ENCOUNTER — Other Ambulatory Visit: Payer: Self-pay

## 2020-06-07 DIAGNOSIS — K5090 Crohn's disease, unspecified, without complications: Secondary | ICD-10-CM | POA: Insufficient documentation

## 2020-06-07 MED ORDER — VEDOLIZUMAB 300 MG IV SOLR
300.0000 mg | Freq: Once | INTRAVENOUS | Status: AC
  Administered 2020-06-07: 300 mg via INTRAVENOUS
  Filled 2020-06-07: qty 5

## 2020-06-07 MED ORDER — METHYLPREDNISOLONE SODIUM SUCC 40 MG IJ SOLR
40.0000 mg | Freq: Once | INTRAMUSCULAR | Status: AC
  Administered 2020-06-07: 40 mg via INTRAVENOUS
  Filled 2020-06-07: qty 1

## 2020-06-07 MED ORDER — SODIUM CHLORIDE 0.9 % IV SOLN: INTRAVENOUS | Status: DC

## 2020-06-08 NOTE — Progress Notes (Signed)
Cardiology Office Note  Date: 06/09/2020   ID: [name] [name], DOB April 02, 1970, MRN [id number]  PCP:  Cassandria Anger, MD  Cardiologist:  Carlyle Dolly, MD Electrophysiologist:  None   Chief Complaint: Follow up HOCM  History of Present Illness: [name] [name] is a 50 y.o. female with a history of heart murmur, thrombocytopenia.   Last seen by Dr Harl Bowie 02/11/2020.  She had been referred by Dr. Delton Coombes for cardiac murmur.  She stated she'd had a murmur for quite some time. Having some general fatigue, and occasional SOB. 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 Addendum to note on 04/25/20 mentioned echo and MRI suggest hypertrophic cardiomyopathy. Would need to follow up and likely start BB and risk stratify for ICD   She was here last visit follow-up status post recent echocardiogram and cardiac MRI with diagnosis of hypertrophic obstructive cardiomyopathy.  We discussed family history.  She stated both of her parents died of cardiac arrest but both had other issues with coronary artery disease which may have precipitated cardiac arrest.  No known history of hypertrophic cardiomyopathy in first-degree relatives.  Denied any history of syncope or near syncopal episodes.  She denies any palpitations or arrhythmias, denies any anginal symptoms or exertional symptoms.  She felt fatigued on a normal basis due to being on immunosuppressive drugs for her Crohn's disease.  Currently on Lotensin.  Cardiac MRI demonstrated: Maximal LV wall thickness was 18 mm at the basal anteroseptal.  She had systolic anterior motion of the mitral valve with flow dephasing suggesting left ventricular outflow tract obstruction.  Mitral regurgitation considered to be moderate visually.  No apical aneurysm.  LGE approximately 8% of myocardial mass.  Normal T1 myocardial kinetics suggested against a diagnosis of cardiac amyloidosis.  No WMA's.  She is here today for follow-up status post starting beta-blocker.   She states she had a significant reaction to the first dose of beta-blocker and did not take any more doses.  She continues on benazepril 10 mg daily.  Blood pressure is elevated today at 142/86.  She states her blood pressure at home is usually running in the 932-355 systolic range.  She denies any other issues.  No anginal or exertional symptoms, palpitations or arrhythmias, orthostatic symptoms, CVA or TIA-like symptoms, PND, orthopnea, bleeding.  Denies any lower extremity edema.  Past Medical History:  Diagnosis Date  . Anxiety   . Arthritis    Dr. Eddie Dibbles  . Bartholin cyst 2008   vag.  [name] BIPOLAR AFFECTIVE DISORDER 04/14/2007  . Crohn's ON ENTYVIO SINCE FEB [id number]   COMPLICATED BY RECTOVAGINAL FISTULA. SJS WITH REMICADE. FAILED HUMIRA.  [name] Depression   . Elevated glucose 2010  . GERD (gastroesophageal reflux disease)   . Hypertension   . Kidney stone   . LBP (low back pain)    Dr. Mina Marble  . Perianal abscess 2009  . Vitamin B12 deficiency     Past Surgical History:  Procedure Laterality Date  . BALLOON DILATION N/A 10/15/2019   Procedure: BALLOON DILATION;  Surgeon: Eloise Harman, DO;  [facility];  Service: Endoscopy;  Laterality: N/A;  . BIOPSY  10/15/2019   Procedure: BIOPSY;  Surgeon: Eloise Harman, DO;  Location: [facility];  Service: Endoscopy;;  . COLONOSCOPY  09/2008   Dr. Derrill Kay: ulcers, edema, possible fistula openings all seen in the distal 3 cm of anus/anal canal. 1cm pseudopolyp. rest of colon and TI normal. rectal biopsy with mild chronic active colitis. ileum  bx normal.  . COLONOSCOPY WITH PROPOFOL N/A 03/29/2015   SLF: Severe proctocolitis limitied to cecum and rectum. Limited exam of the colon mucosa and anal canal.   . COLONOSCOPY WITH PROPOFOL N/A 10/15/2019   Procedure: COLONOSCOPY WITH PROPOFOL;  Surgeon: Eloise Harman, DO;  Location: [facility];  Service: Endoscopy;  Laterality: N/A;  10:45am  . ELBOW SURGERY     left  .  ESOPHAGOGASTRODUODENOSCOPY (EGD) WITH PROPOFOL N/A 03/29/2015   SLF: 1. Erosive gastritis duodentitis.   [name] ESOPHAGOGASTRODUODENOSCOPY (EGD) WITH PROPOFOL N/A 10/15/2019   Procedure: ESOPHAGOGASTRODUODENOSCOPY (EGD) WITH PROPOFOL;  Surgeon: Eloise Harman, DO;  Location: [facility];  Service: Endoscopy;  Laterality: N/A;  . RECTOVAGINAL FISTULA CLOSURE     did not help    Current Outpatient Medications  Medication Sig Dispense Refill  . ALPRAZolam (XANAX) 1 MG tablet Take 1 mg by mouth 2 (two) times daily.    . benazepril (LOTENSIN) 10 MG tablet Take 1 tablet (10 mg total) by mouth daily. 30 tablet 6  . BREO ELLIPTA 100-25 MCG/INH AEPB Inhale 1 puff into the lungs as needed.    . Cholecalciferol (VITAMIN D3) 250 MCG (10000 UT) capsule Take 30,000 Units by mouth once a week.    . cyanocobalamin (,VITAMIN B-12,) 1000 MCG/ML injection INJECT 1ML INTO THE SKIN EVERY 14 DAYS (Patient taking differently: Inject 1,000 mcg into the muscle every 14 (fourteen) days.) 6 mL 11  . HYDROcodone-acetaminophen (NORCO) 10-325 MG tablet Take 1 tablet by mouth 2 (two) times daily as needed for severe pain. 60 tablet 0  . HYDROcodone-acetaminophen (NORCO) 10-325 MG tablet Take 1 tablet by mouth 2 (two) times daily as needed for severe pain. 60 tablet 0  . HYDROcodone-acetaminophen (NORCO) 10-325 MG tablet Take 1 tablet by mouth 2 (two) times daily as needed for severe pain. 60 tablet 0  . lamoTRIgine (LAMICTAL) 200 MG tablet Take 200 mg by mouth daily.     . metoprolol tartrate (LOPRESSOR) 25 MG tablet Take 0.5 tablets (12.5 mg total) by mouth 2 (two) times daily. 15 tablet 6  . norethindrone (AYGESTIN) 5 MG tablet Take 1 tablet (5 mg total) by mouth daily. 90 tablet 2  . pantoprazole (PROTONIX) 40 MG tablet TAKE 1 TABLET BY MOUTH 30MIN PRIOR TO FIRST MEAL 90 tablet 3  . potassium chloride (KLOR-CON) 10 MEQ tablet TAKE 1 TABLET BY MOUTH EVERY DAY (Patient taking differently: Take 10 mEq by mouth daily.) 90 tablet  3  . sertraline (ZOLOFT) 100 MG tablet Take 200 mg by mouth daily.    . SYRINGE-NEEDLE, DISP, 3 ML (B-D SYRINGE/NEEDLE 3CC/25GX5/8) 25G X 5/8" 3 ML MISC Use to administer B12 injection every 14 days 24 each 0  . vedolizumab (ENTYVIO) 300 MG injection Inject 300 mg into the vein. Every 12 weeks     No current facility-administered medications for this visit.   Allergies:  Cephalexin, Codeine, Guaifenesin, Imitrex [sumatriptan], Morphine, Remicade [infliximab], Sulfonamide derivatives, and Ultram [tramadol hcl]   Social History: The patient  reports that she quit smoking about 17 months ago. Her smoking use included cigarettes. She has a 12.50 pack-year smoking history. She has never used smokeless tobacco. She reports that she does not drink alcohol and does not use drugs.   Family History: The patient's family history includes Breast cancer in her maternal grandmother; Cancer in her maternal aunt, maternal grandfather, and maternal grandmother; Diabetes in her mother; Early death (age of onset: 34) in her maternal grandmother; Heart attack  in her father and mother; Heart disease in her father and mother.   ROS:  Please see the history of present illness. Otherwise, complete review of systems is positive for none.  All other systems are reviewed and negative.   Physical Exam: VS:  BP (!) 142/86   Pulse 90   Ht 5' 1"  (1.549 m)   Wt 174 lb (78.9 kg)   LMP 03/03/2014   SpO2 96%   BMI 32.88 kg/m , BMI Body mass index is 32.88 kg/m.  Wt Readings from Last 3 Encounters:  06/09/20 174 lb (78.9 kg)  06/07/20 174 lb 6.1 oz (79.1 kg)  05/12/20 174 lb 6.4 oz (79.1 kg)    General: Patient appears comfortable at rest. Neck: Supple, no elevated JVP or carotid bruits, no thyromegaly. Lungs: Clear to auscultation, nonlabored breathing at rest. Cardiac: Regular rate and rhythm, no S3, mild systolic murmur at RUSB, no pericardial rub. Extremities: No pitting edema, distal pulses 2+. Skin: Warm and  dry. Musculoskeletal: No kyphosis. Neuropsychiatric: Alert and oriented x3, affect grossly appropriate.  ECG:  EKG 02/11/2020 sinus tachycardia with a rate of 101  Recent Labwork: 03/31/2020: ALT 11; AST 13; BUN 20; Creatinine, Ser 1.47; Hemoglobin 14.3; Platelets 143; Potassium 3.4; Sodium 133     Component Value Date/Time   CHOL 168 08/30/2014 0820   TRIG 99.0 08/30/2014 0820   HDL 27.40 (L) 08/30/2014 0820   CHOLHDL 6 08/30/2014 0820   VLDL 19.8 08/30/2014 0820   LDLCALC 121 (H) 08/30/2014 0820   LDLDIRECT 171.9 05/01/2011 0836    Other Studies Reviewed Today:  Cardiac MRI 04/08/2020 Normal left ventricular chamber size.  Severe, asymmetric left ventricular hypertrophy.  Maximal wall thickness: 18 mm at basal anteroseptum.  Hypertrophied papillary muscles.  There is systolic anterior motion of the mitral valve. 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 Mitral regurgitation is seen. Visually MR appears moderate.  No evidence of left ventricular apical aneurysm.  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 Morphologic subtype: Sigmoid.  Normal left ventricular systolic function.  LVEF = 57%  There are no regional wall motion abnormalities.  No myocardial edema, T2 is 50 msec.  First pass perfusion - mild subendocardial perfusion defect in mid lateral wall.  There is post contrast delayed myocardial enhancement. Focal delayed myocardial enhancement in the basal anteroseptum.  LGE quantitation: The late gadolinium enhancement is approximately 8% of the myocardial mass.  Normal T1 myocardial nulling kinetics suggest against a diagnosis of cardiac amyloidosis.  ECV = 27%  RIGHT VENTRICLE:  Normal right ventricular chamber size.  Normal right ventricular wall thickness.  Normal right ventricular systolic function.  RVEF = 68%  There are no regional wall motion  abnormalities.  No post contrast delayed myocardial enhancement.  ATRIA:  Mild dilation of left atrial size.  Normal right atrial size.  VALVES:  No significant valvular abnormalities.  GREAT VESSELS:  Aorta normal size, 25 mm at mid ascending aorta.  PERICARDIUM:  Normal pericardium.  No pericardial effusion.  OTHER: No significant extracardiac findings.  MEASUREMENTS: Left ventricle:  LV Female  LV EF: 57% (normal 56-78%)  Absolute volumes:  LV EDV: 55m (Normal 52-141 mL)  LV ESV: 374m(Normal 13-51 mL)  LV SV: 4848mNormal 33-97 mL)  CO: 4.0L/min (normal 2.7-6.0 L/min)  Myocardial mass: 93 grams  Indexed volumes:  LV EDV: 19m63m-m (Normal 41-81 mL/sq-m)  LV ESV: 19mL10mm (Normal 12-21 mL/sq-m)  LV SV: 26mL/41m (Normal 26-56 mL/sq-m)  CI: 2.17L/min/sq-m (Normal 1.8-3.8  L/min/sq-m)  Right ventricle:  RV female  RV EF: 68% (Normal 47-80%)  Absolute volumes:  RV EDV: 46 mL (Normal 58-154 mL)  RV ESV: 15 mL (Normal 12-68 mL)  RV SV: 31 mL (Normal 35-98 mL)  CO: 2.6 L/min (Normal 2.7-6 L/min)  Indexed volumes:  RV EDV: 25 ML/sq-m (Normal 48-87 mL/sq-m)  RV ESV: 8 mL/sq-m (Normal 11-28 mL/sq-m)  RV SV: 20 mL/sq-m (Normal 27-57 mL/sq-m)  CI: 1.4 L/min/sq-m (Normal 1.8-3.8 L/min/sq-m)  IMPRESSION: 1. Hypertrophic cardiomyopathy, sigmoid subtype. Maximal wall thickness 18 mm at basal anteroseptum.  2. 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  3. Focal delayed myocardial enhancement in basal anteroseptum. Total LGE is approximately 8% of myocardial mass.  4.  No LV apical aneurysm.  5.  Normal LV size and function, LVEF 57%.  6.  Normal RV size and function, RVEF 68%.  7. Resting first pass perfusion shows mild subendocardial perfusion defect in mid lateral wall. No associated wall motion  abnormality.   Electronically Signed   By: Cherlynn Kaiser   On: 04/11/2020 09:58    Echocardiogram 03/01/2020  1. Left ventricular ejection fraction, by estimation, is 60 to 65%. The left ventricle has normal function. The left ventricle has no regional wall motion abnormalities. There is moderate asymmetric left ventricular hypertrophy of the septal segment based on limited views (study measurements are incorrect). Left ventricular diastolic parameters are indeterminate. 2. Right ventricular systolic function is normal. The right ventricular size is normal. 3. The mitral valve is grossly normal. No evidence of mitral valve regurgitation. 4. The aortic valve is tricuspid and appears to have normal cusp excursion based on limited views. Aortic valve regurgitation is not visualized. Increased velocity across LVOT and AV likely due to asymmetric LV septal hypertrophy (possible SAM as well) although cannot exclude other source of subvalvular stenosis. Aortic valve mean gradient measures 35.0 mmHg. Aortic valve Vmax measures 4.24 m/s. Consider cardiac MRI for further evaluation. 5. The inferior vena cava is normal in size with greater than 50% respiratory variability, suggesting right atrial pressure of 3 mmHg.  Assessment and Plan:  1. Obstructive hypertrophic cardiomyopathy ([place])   2. Essential hypertension, benign    1. Obstructive hypertrophic cardiomyopathy ([place])  She states after last visit when starting the beta-blocker she had a significant reaction and only took 1 pill as a result.  States she was sweaty, having palpitations, feeling dysphoric and took no more of the medication.  States she has had similar reaction after taking morphine.  We are placing metoprolol on allergy list.  Recent evidence of hypertrophic obstructive cardiomyopathy on recent echo and cardiac MRI.  She denies any syncopal or near syncopal episodes, palpitations or arrhythmias, anginal or exertional  symptoms.  Stay hydrated especially during activity and during warmer climate when hydration is more prevalent.   Continued to advise hydration at today's visit.  2. Essential hypertension, benign Blood pressure is elevated today.  She states at home systolic running in the 622-633H systolic range.  Please increase benazepril to 20 mg daily.    She needs to avoid any medication which may contribute to dehydration due to recent diagnosis of HOCM.  Medication Adjustments/Labs and Tests Ordered: Current medicines are reviewed at length with the patient today.  Concerns regarding medicines are outlined above.   Disposition: Follow-up with Dr. Harl Bowie or APP 3 months  Signed, Levell July, NP 06/09/2020 8:22 AM    [facility] [address], [place], [place] [address] Phone: [phone]; Fax: [phone]

## 2020-06-09 ENCOUNTER — Encounter: Payer: Self-pay | Admitting: Family Medicine

## 2020-06-09 ENCOUNTER — Ambulatory Visit (INDEPENDENT_AMBULATORY_CARE_PROVIDER_SITE_OTHER): Payer: 59 | Admitting: Family Medicine

## 2020-06-09 ENCOUNTER — Other Ambulatory Visit: Payer: Self-pay

## 2020-06-09 VITALS — BP 142/86 | HR 90 | Ht 61.0 in | Wt 174.0 lb

## 2020-06-09 DIAGNOSIS — I10 Essential (primary) hypertension: Secondary | ICD-10-CM | POA: Diagnosis not present

## 2020-06-09 DIAGNOSIS — I421 Obstructive hypertrophic cardiomyopathy: Secondary | ICD-10-CM | POA: Diagnosis not present

## 2020-06-09 MED ORDER — BENAZEPRIL HCL 20 MG PO TABS: 20.0000 mg | ORAL_TABLET | Freq: Every day | ORAL | 6 refills | Status: DC

## 2020-06-09 NOTE — Patient Instructions (Signed)
Medication Instructions:   Remain off of the Metoprolol (Lopressor).    Increase Benazepril to 70m daily.  Continue all other medications.    Labwork: none  Testing/Procedures: none  Follow-Up: 3 months   Any Other Special Instructions Will Be Listed Below (If Applicable).  If you need a refill on your cardiac medications before your next appointment, please call your pharmacy.

## 2020-06-10 ENCOUNTER — Other Ambulatory Visit: Payer: Self-pay | Admitting: Internal Medicine

## 2020-07-14 ENCOUNTER — Other Ambulatory Visit: Payer: Self-pay | Admitting: Internal Medicine

## 2020-07-21 ENCOUNTER — Telehealth: Payer: Self-pay | Admitting: *Deleted

## 2020-07-21 NOTE — Telephone Encounter (Signed)
Received call from [place] in endo. Patient authorization for [name] has expired as of 07/20/2020. She needs new auth and please let Hoyle Sauer know once received. Thanks!

## 2020-07-29 ENCOUNTER — Other Ambulatory Visit: Payer: Self-pay

## 2020-08-01 ENCOUNTER — Encounter: Payer: Self-pay | Admitting: Internal Medicine

## 2020-08-01 ENCOUNTER — Other Ambulatory Visit: Payer: Self-pay

## 2020-08-01 ENCOUNTER — Ambulatory Visit (INDEPENDENT_AMBULATORY_CARE_PROVIDER_SITE_OTHER): Payer: 59 | Admitting: Internal Medicine

## 2020-08-01 DIAGNOSIS — F419 Anxiety disorder, unspecified: Secondary | ICD-10-CM

## 2020-08-01 DIAGNOSIS — M5440 Lumbago with sciatica, unspecified side: Secondary | ICD-10-CM | POA: Diagnosis not present

## 2020-08-01 DIAGNOSIS — E538 Deficiency of other specified B group vitamins: Secondary | ICD-10-CM | POA: Diagnosis not present

## 2020-08-01 DIAGNOSIS — G8929 Other chronic pain: Secondary | ICD-10-CM | POA: Diagnosis not present

## 2020-08-01 DIAGNOSIS — K50013 Crohn's disease of small intestine with fistula: Secondary | ICD-10-CM

## 2020-08-01 DIAGNOSIS — R300 Dysuria: Secondary | ICD-10-CM | POA: Diagnosis not present

## 2020-08-01 DIAGNOSIS — N390 Urinary tract infection, site not specified: Secondary | ICD-10-CM | POA: Diagnosis not present

## 2020-08-01 LAB — COMPREHENSIVE METABOLIC PANEL
ALT: 8 U/L (ref 0–35)
AST: 10 U/L (ref 0–37)
Albumin: 4.5 g/dL (ref 3.5–5.2)
Alkaline Phosphatase: 58 U/L (ref 39–117)
BUN: 21 mg/dL (ref 6–23)
CO2: 27 mEq/L (ref 19–32)
Calcium: 9.6 mg/dL (ref 8.4–10.5)
Chloride: 105 mEq/L (ref 96–112)
Creatinine, Ser: 1.64 mg/dL — ABNORMAL HIGH (ref 0.40–1.20)
GFR: 36.24 mL/min — ABNORMAL LOW (ref >60.00)
Glucose, Bld: 75 mg/dL (ref 70–99)
Potassium: 4 mEq/L (ref 3.5–5.1)
Sodium: 140 mEq/L (ref 135–145)
Total Bilirubin: 0.6 mg/dL (ref 0.2–1.2)
Total Protein: 6.9 g/dL (ref 6.0–8.3)

## 2020-08-01 LAB — URINALYSIS, ROUTINE W REFLEX MICROSCOPIC
Bilirubin Urine: NEGATIVE
Ketones, ur: NEGATIVE
Leukocytes,Ua: NEGATIVE
Nitrite: NEGATIVE
Specific Gravity, Urine: >=1.03 — AB (ref 1.000–1.030)
Urine Glucose: NEGATIVE
Urobilinogen, UA: 0.2 (ref 0.0–1.0)
pH: 5.5 (ref 5.0–8.0)

## 2020-08-01 LAB — CBC WITH DIFFERENTIAL/PLATELET
Basophils Absolute: 0 10*3/uL (ref 0.0–0.1)
Basophils Relative: 0.5 % (ref 0.0–3.0)
Eosinophils Absolute: 0.1 10*3/uL (ref 0.0–0.7)
Eosinophils Relative: 0.7 % (ref 0.0–5.0)
HCT: 41.1 % (ref 36.0–46.0)
Hemoglobin: 14.1 g/dL (ref 12.0–15.0)
Lymphocytes Relative: 25.2 % (ref 12.0–46.0)
Lymphs Abs: 1.9 10*3/uL (ref 0.7–4.0)
MCHC: 34.4 g/dL (ref 30.0–36.0)
MCV: 90.1 fl (ref 78.0–100.0)
Monocytes Absolute: 0.4 10*3/uL (ref 0.1–1.0)
Monocytes Relative: 4.8 % (ref 3.0–12.0)
Neutro Abs: 5.1 10*3/uL (ref 1.4–7.7)
Neutrophils Relative %: 68.8 % (ref 43.0–77.0)
Platelets: 133 10*3/uL — ABNORMAL LOW (ref 150.0–400.0)
RBC: 4.56 Mil/uL (ref 3.87–5.11)
RDW: 12.9 % (ref 11.5–15.5)
WBC: 7.4 10*3/uL (ref 4.0–10.5)

## 2020-08-01 LAB — TSH: TSH: 1.61 u[IU]/mL (ref 0.35–4.50)

## 2020-08-01 LAB — VITAMIN B12: Vitamin B-12: 268 pg/mL (ref 211–911)

## 2020-08-01 MED ORDER — HYDROCODONE-ACETAMINOPHEN 10-325 MG PO TABS: 1.0000 | ORAL_TABLET | Freq: Two times a day (BID) | ORAL | 0 refills | Status: DC | PRN

## 2020-08-01 MED ORDER — FLUCONAZOLE 150 MG PO TABS: 150.0000 mg | ORAL_TABLET | Freq: Once | ORAL | 3 refills | Status: AC

## 2020-08-01 MED ORDER — POTASSIUM CHLORIDE ER 10 MEQ PO TBCR: 10.0000 meq | EXTENDED_RELEASE_TABLET | Freq: Every day | ORAL | 3 refills | Status: DC

## 2020-08-01 MED ORDER — PANTOPRAZOLE SODIUM 40 MG PO TBEC: DELAYED_RELEASE_TABLET | ORAL | 3 refills | Status: DC

## 2020-08-01 MED ORDER — ALPRAZOLAM 1 MG PO TABS: 1.0000 mg | ORAL_TABLET | Freq: Two times a day (BID) | ORAL | 2 refills | Status: DC

## 2020-08-01 MED ORDER — CIPROFLOXACIN HCL 500 MG PO TABS: 500.0000 mg | ORAL_TABLET | Freq: Two times a day (BID) | ORAL | 0 refills | Status: AC

## 2020-08-01 NOTE — Assessment & Plan Note (Signed)
Cont w/Xanax prn  Potential benefits of a long term benzodiazepines  use as well as potential risks  and complications were explained to the patient and were aknowledged. Do not take w/pain meds.

## 2020-08-01 NOTE — Assessment & Plan Note (Signed)
On Norco  Potential benefits of a long term opioids use as well as potential risks (i.e. addiction risk, apnea etc) and complications (i.e. Somnolence, constipation and others) were explained to the patient and were aknowledged.

## 2020-08-01 NOTE — Addendum Note (Signed)
Addended by: Boris Lown B on: 08/01/2020 08:26 AM   Modules accepted: Orders

## 2020-08-01 NOTE — Telephone Encounter (Signed)
Pt's paperwork faxed due to Cover My Meds not recognizing pt's DOB for some unknown reason. Spoke with the pt and was advised nothing has changed on her part. Advised I will have to fax for authorization for Entyvio.

## 2020-08-01 NOTE — Assessment & Plan Note (Signed)
On IV Entyvio q 12 wks

## 2020-08-01 NOTE — Assessment & Plan Note (Signed)
Cont w/B12 inj

## 2020-08-01 NOTE — Assessment & Plan Note (Signed)
Cipro and Diflucan Rx

## 2020-08-01 NOTE — Assessment & Plan Note (Signed)
Check UA Likely a UTI - Cipro and Diflucan Rx

## 2020-08-01 NOTE — Progress Notes (Signed)
Subjective:  Patient ID: [name] [name], female    DOB: 1970/09/30  Age: 50 y.o. MRN: [id number]  CC: Follow-up (3 month f/u)   HPI KALIANNE FETTING presents for a c/o UTI F/u anxiety, LBP, hypokalemia  Outpatient Medications Prior to Visit  Medication Sig Dispense Refill   ALPRAZolam (XANAX) 1 MG tablet Take 1 mg by mouth 2 (two) times daily.     benazepril (LOTENSIN) 20 MG tablet Take 1 tablet (20 mg total) by mouth daily. 30 tablet 6   benazepril-hydrochlorthiazide (LOTENSIN HCT) 20-12.5 MG tablet TAKE 1 TABLET BY MOUTH EVERY DAY 90 tablet 1   BREO ELLIPTA 100-25 MCG/INH AEPB Inhale 1 puff into the lungs as needed.     Cholecalciferol (VITAMIN D3) 250 MCG (10000 UT) capsule Take 30,000 Units by mouth once a week.     cyanocobalamin (,VITAMIN B-12,) 1000 MCG/ML injection INJECT 1ML INTO THE SKIN EVERY 14 DAYS 6 mL 5   HYDROcodone-acetaminophen (NORCO) 10-325 MG tablet Take 1 tablet by mouth 2 (two) times daily as needed for severe pain. 60 tablet 0   lamoTRIgine (LAMICTAL) 200 MG tablet Take 200 mg by mouth daily.      norethindrone (AYGESTIN) 5 MG tablet Take 1 tablet (5 mg total) by mouth daily. 90 tablet 2   pantoprazole (PROTONIX) 40 MG tablet TAKE 1 TABLET BY MOUTH 30MIN PRIOR TO FIRST MEAL 90 tablet 3   potassium chloride (KLOR-CON) 10 MEQ tablet TAKE 1 TABLET BY MOUTH EVERY DAY (Patient taking differently: Take 10 mEq by mouth daily.) 90 tablet 3   sertraline (ZOLOFT) 100 MG tablet Take 200 mg by mouth daily.     SYRINGE-NEEDLE, DISP, 3 ML (B-D SYRINGE/NEEDLE 3CC/25GX5/8) 25G X 5/8" 3 ML MISC Use to administer B12 injection every 14 days 24 each 0   vedolizumab (ENTYVIO) 300 MG injection Inject 300 mg into the vein. Every 12 weeks     HYDROcodone-acetaminophen (NORCO) 10-325 MG tablet Take 1 tablet by mouth 2 (two) times daily as needed for severe pain. 60 tablet 0   HYDROcodone-acetaminophen (NORCO) 10-325 MG tablet Take 1 tablet by mouth 2 (two) times daily as needed for severe  pain. 60 tablet 0   No facility-administered medications prior to visit.    ROS: Review of Systems  Constitutional:  Negative for activity change, appetite change, chills, fatigue and unexpected weight change.  HENT:  Negative for congestion, mouth sores and sinus pressure.   Eyes:  Negative for visual disturbance.  Respiratory:  Negative for cough and chest tightness.   Gastrointestinal:  Negative for abdominal pain and nausea.  Genitourinary:  Positive for frequency and urgency. Negative for difficulty urinating and vaginal pain.  Musculoskeletal:  Positive for back pain. Negative for gait problem.  Skin:  Negative for pallor and rash.  Neurological:  Negative for dizziness, tremors, weakness, numbness and headaches.  Psychiatric/Behavioral:  Negative for confusion, sleep disturbance and suicidal ideas. The patient is nervous/anxious.    Objective:  BP 138/72 (BP Location: Left Arm)   Pulse 94   Temp 98.7 F (37.1 C) (Oral)   Ht 5' 1"  (1.549 m)   Wt 170 lb 6.4 oz (77.3 kg)   LMP 03/03/2014   SpO2 97%   BMI 32.20 kg/m   BP Readings from Last 3 Encounters:  08/01/20 138/72  06/09/20 (!) 142/86  06/07/20 (!) 148/92    Wt Readings from Last 3 Encounters:  08/01/20 170 lb 6.4 oz (77.3 kg)  06/09/20 174 lb (78.9 kg)  06/07/20 174 lb 6.1 oz (79.1 kg)    Physical Exam Constitutional:      General: She is not in acute distress.    Appearance: She is well-developed. She is obese.  HENT:     Head: Normocephalic.     Right Ear: External ear normal.     Left Ear: External ear normal.     Nose: Nose normal.  Eyes:     General:        Right eye: No discharge.        Left eye: No discharge.     Conjunctiva/sclera: Conjunctivae normal.     Pupils: Pupils are equal, round, and reactive to light.  Neck:     Thyroid: No thyromegaly.     Vascular: No JVD.     Trachea: No tracheal deviation.  Cardiovascular:     Rate and Rhythm: Normal rate and regular rhythm.     Heart  sounds: Normal heart sounds.  Pulmonary:     Effort: No respiratory distress.     Breath sounds: No stridor. No wheezing.  Abdominal:     General: Bowel sounds are normal. There is no distension.     Palpations: Abdomen is soft. There is no mass.     Tenderness: There is no abdominal tenderness. There is no guarding or rebound.  Musculoskeletal:        General: Tenderness present.     Cervical back: Normal range of motion and neck supple. No rigidity.  Lymphadenopathy:     Cervical: No cervical adenopathy.  Skin:    Findings: No erythema or rash.  Neurological:     Cranial Nerves: No cranial nerve deficit.     Motor: No abnormal muscle tone.     Coordination: Coordination normal.     Deep Tendon Reflexes: Reflexes normal.  Psychiatric:        Behavior: Behavior normal.        Thought Content: Thought content normal.        Judgment: Judgment normal.    Lab Results  Component Value Date   WBC 8.1 03/31/2020   HGB 14.3 03/31/2020   HCT 42.9 03/31/2020   PLT 143 (L) 03/31/2020   GLUCOSE 121 (H) 03/31/2020   CHOL 168 08/30/2014   TRIG 99.0 08/30/2014   HDL 27.40 (L) 08/30/2014   LDLDIRECT 171.9 05/01/2011   LDLCALC 121 (H) 08/30/2014   ALT 11 03/31/2020   AST 13 (L) 03/31/2020   NA 133 (L) 03/31/2020   K 3.4 (L) 03/31/2020   CL 102 03/31/2020   CREATININE 1.47 (H) 03/31/2020   BUN 20 03/31/2020   CO2 23 03/31/2020   TSH 0.585 05/28/2018    No results found.  Assessment & Plan:     [name], MD

## 2020-08-05 ENCOUNTER — Telehealth: Payer: Self-pay

## 2020-08-05 NOTE — Telephone Encounter (Signed)
Pt has been approved for Entyvio 300 mg q 12 weeks. Approved from 08/05/2020 to 08/05/2021. Authorization # [id number]. ( Total 5 visits) CPT codes K 50.813 and J 33.80. (Medical side of insurance to get authorization)  . Will give to Manuela Schwartz to scan into the pt's chart. Faxed to [facility] @ short stay [phone].

## 2020-08-05 NOTE — Telephone Encounter (Signed)
Pt's [name] has been approved 08/05/2020 to 08/05/2021. Physician order faxed to [facility] at [facility]. All paperwork and codes written on physicians order after faxed with Dx codes and instructions (for next go round). Put up front for Manuela Schwartz to scan into the pt's chart.

## 2020-08-07 ENCOUNTER — Other Ambulatory Visit: Payer: Self-pay | Admitting: Family Medicine

## 2020-08-09 ENCOUNTER — Encounter (HOSPITAL_COMMUNITY): Payer: Self-pay

## 2020-08-09 NOTE — Progress Notes (Signed)
Patient called requesting that Dr. Delton Coombes review her labs that she had done on Monday. She said to look at the platelets because she is bruising easily and wants to know what she can do.  Dr. Libby Maw response- Please let the patient know that her Platelets are 133. Normal is 150 and above. This is a modest drop in Plts and should not be causing her bruising.  Patient aware.

## 2020-08-24 NOTE — Telephone Encounter (Signed)
Med not on med list. Pls advise.Marland KitchenJohny [name]

## 2020-08-26 ENCOUNTER — Other Ambulatory Visit: Payer: Self-pay | Admitting: Internal Medicine

## 2020-08-30 ENCOUNTER — Ambulatory Visit: Payer: 59 | Admitting: Gastroenterology

## 2020-08-30 ENCOUNTER — Other Ambulatory Visit: Payer: Self-pay

## 2020-08-30 ENCOUNTER — Encounter (HOSPITAL_COMMUNITY)
Admission: RE | Admit: 2020-08-30 | Discharge: 2020-08-30 | Disposition: A | Discharge status: Home / Self Care | Payer: 59 | Source: Ambulatory Visit | Attending: Gastroenterology | Admitting: Gastroenterology

## 2020-08-30 DIAGNOSIS — K5090 Crohn's disease, unspecified, without complications: Secondary | ICD-10-CM | POA: Diagnosis not present

## 2020-08-30 MED ORDER — SODIUM CHLORIDE 0.9 % IV SOLN: INTRAVENOUS | Status: DC

## 2020-08-30 MED ORDER — METHYLPREDNISOLONE SODIUM SUCC 40 MG IJ SOLR
40.0000 mg | Freq: Once | INTRAMUSCULAR | Status: AC
  Administered 2020-08-30: 40 mg via INTRAVENOUS
  Filled 2020-08-30: qty 1

## 2020-08-30 MED ORDER — VEDOLIZUMAB 300 MG IV SOLR
300.0000 mg | Freq: Once | INTRAVENOUS | Status: AC
  Administered 2020-08-30: 300 mg via INTRAVENOUS
  Filled 2020-08-30: qty 5

## 2020-09-13 ENCOUNTER — Encounter: Payer: Self-pay | Admitting: Internal Medicine

## 2020-09-14 NOTE — Progress Notes (Deleted)
Cardiology Office Note  Date: 09/14/2020   ID: [name] [name], DOB Jun 10, 1970, MRN [id number]  PCP:  Cassandria Anger, MD  Cardiologist:  Carlyle Dolly, MD Electrophysiologist:  None   Chief Complaint: 3 month follow up  History of Present Illness: [name] [name] is a 50 y.o. female with a history of heart murmur, thrombocytopenia.   Last seen by Dr Harl Bowie 02/11/2020.  She had been referred by Dr. Delton Coombes for cardiac murmur.  She stated she'd had a murmur for quite some time. Having some general fatigue, and occasional SOB. 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 Addendum to note on 04/25/20 mentioned echo and MRI suggest hypertrophic cardiomyopathy. Would need to follow up and likely start BB and risk stratify for ICD   She was here last visit follow-up status post recent echocardiogram and cardiac MRI with diagnosis of hypertrophic obstructive cardiomyopathy.  We discussed family history.  She stated both of her parents died of cardiac arrest but both had other issues with coronary artery disease which may have precipitated cardiac arrest.  No known history of hypertrophic cardiomyopathy in first-degree relatives.  Denied any history of syncope or near syncopal episodes.  She denies any palpitations or arrhythmias, denies any anginal symptoms or exertional symptoms.  She felt fatigued on a normal basis due to being on immunosuppressive drugs for her Crohn's disease.  Currently on Lotensin.  Cardiac MRI demonstrated: Maximal LV wall thickness was 18 mm at the basal anteroseptal.  She had systolic anterior motion of the mitral valve with flow dephasing suggesting left ventricular outflow tract obstruction.  Mitral regurgitation considered to be moderate visually.  No apical aneurysm.  LGE approximately 8% of myocardial mass.  Normal T1 myocardial kinetics suggested against a diagnosis of cardiac amyloidosis.  No WMA's.  She is here today for follow-up status post starting  beta-blocker.  She states she had a significant reaction to the first dose of beta-blocker and did not take any more doses.  She continues on benazepril 10 mg daily.  Blood pressure is elevated today at 142/86.  She states her blood pressure at home is usually running in the 478-295 systolic range.  She denies any other issues.  No anginal or exertional symptoms, palpitations or arrhythmias, orthostatic symptoms, CVA or TIA-like symptoms, PND, orthopnea, bleeding.  Denies any lower extremity edema.  Past Medical History:  Diagnosis Date   Anxiety    Arthritis    Dr. Eddie Dibbles   Bartholin cyst 2008   vag.   BIPOLAR AFFECTIVE DISORDER 04/14/2007   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   Depression    Elevated glucose 2010   GERD (gastroesophageal reflux disease)    Hypertension    Kidney stone    LBP (low back pain)    Dr. Mina Marble   Perianal abscess 2009   Vitamin B12 deficiency     Past Surgical History:  Procedure Laterality Date   BALLOON DILATION N/A 10/15/2019   Procedure: BALLOON DILATION;  Surgeon: Eloise Harman, DO;  Location: [facility];  Service: Endoscopy;  Laterality: N/A;   BIOPSY  10/15/2019   Procedure: BIOPSY;  Surgeon: Eloise Harman, DO;  Location: [facility];  Service: Endoscopy;;   COLONOSCOPY  09/2008   Dr. Derrill Kay: ulcers, edema, possible fistula openings all seen in the distal 3 cm of anus/anal canal. 1cm pseudopolyp. rest of colon and TI normal. rectal biopsy with mild chronic active colitis.  ileum bx normal.   COLONOSCOPY WITH PROPOFOL N/A 03/29/2015   SLF: Severe proctocolitis limitied to cecum and rectum. Limited exam of the colon mucosa and anal canal.    COLONOSCOPY WITH PROPOFOL N/A 10/15/2019   Procedure: COLONOSCOPY WITH PROPOFOL;  Surgeon: Eloise Harman, DO;  Location: [facility];  Service: Endoscopy;  Laterality: N/A;  10:45am   ELBOW SURGERY     left    ESOPHAGOGASTRODUODENOSCOPY (EGD) WITH PROPOFOL N/A 03/29/2015   SLF: 1. Erosive gastritis duodentitis.    ESOPHAGOGASTRODUODENOSCOPY (EGD) WITH PROPOFOL N/A 10/15/2019   Procedure: ESOPHAGOGASTRODUODENOSCOPY (EGD) WITH PROPOFOL;  Surgeon: Eloise Harman, DO;  Location: [facility];  Service: Endoscopy;  Laterality: N/A;   RECTOVAGINAL FISTULA CLOSURE     did not help    Current Outpatient Medications  Medication Sig Dispense Refill   ALPRAZolam (XANAX) 1 MG tablet Take 1 tablet (1 mg total) by mouth 2 (two) times daily. 30 tablet 2   benazepril (LOTENSIN) 20 MG tablet Take 1 tablet (20 mg total) by mouth daily. 30 tablet 6   benazepril-hydrochlorthiazide (LOTENSIN HCT) 20-12.5 MG tablet TAKE 1 TABLET BY MOUTH EVERY DAY 90 tablet 1   BREO ELLIPTA 100-25 MCG/INH AEPB Inhale 1 puff into the lungs as needed.     Cholecalciferol (VITAMIN D3) 250 MCG (10000 UT) capsule Take 30,000 Units by mouth once a week.     cyanocobalamin (,VITAMIN B-12,) 1000 MCG/ML injection INJECT 1ML INTO THE SKIN EVERY 14 DAYS 6 mL 5   HYDROcodone-acetaminophen (NORCO) 10-325 MG tablet Take 1 tablet by mouth 2 (two) times daily as needed for severe pain. 60 tablet 0   HYDROcodone-acetaminophen (NORCO) 10-325 MG tablet Take 1 tablet by mouth 2 (two) times daily as needed for severe pain. 60 tablet 0   HYDROcodone-acetaminophen (NORCO) 10-325 MG tablet Take 1 tablet by mouth 2 (two) times daily as needed for severe pain. 60 tablet 0   lamoTRIgine (LAMICTAL) 200 MG tablet Take 200 mg by mouth daily.      norethindrone (AYGESTIN) 5 MG tablet Take 1 tablet (5 mg total) by mouth daily. 90 tablet 2   pantoprazole (PROTONIX) 40 MG tablet 1 po qam 90 tablet 3   potassium chloride (KLOR-CON) 10 MEQ tablet Take 1 tablet (10 mEq total) by mouth daily. 90 tablet 3   sertraline (ZOLOFT) 100 MG tablet Take 200 mg by mouth daily.     SYRINGE-NEEDLE, DISP, 3 ML (B-D SYRINGE/NEEDLE 3CC/25GX5/8) 25G X 5/8" 3 ML MISC Use to administer B12  injection every 14 days 24 each 0   vedolizumab (ENTYVIO) 300 MG injection Inject 300 mg into the vein. Every 12 weeks     No current facility-administered medications for this visit.   Allergies:  Cephalexin, Codeine, Guaifenesin, Imitrex [sumatriptan], Lopressor [metoprolol tartrate], Morphine, Remicade [infliximab], Sulfonamide derivatives, and Ultram [tramadol hcl]   Social History: The patient  reports that she quit smoking about 20 months ago. Her smoking use included cigarettes. She has a 12.50 pack-year smoking history. She has never used smokeless tobacco. She reports that she does not drink alcohol and does not use drugs.   Family History: The patient's family history includes Breast cancer in her maternal grandmother; Cancer in her maternal aunt, maternal grandfather, and maternal grandmother; Diabetes in her mother; Early death (age of onset: 47) in her maternal grandmother; Heart attack in her father and mother; Heart disease in her father and mother.   ROS:  Please see the history of present illness. Otherwise,  complete review of systems is positive for none.  All other systems are reviewed and negative.   Physical Exam: VS:  LMP 03/03/2014 , BMI There is no height or weight on file to calculate BMI.  Wt Readings from Last 3 Encounters:  08/01/20 170 lb 6.4 oz (77.3 kg)  06/09/20 174 lb (78.9 kg)  06/07/20 174 lb 6.1 oz (79.1 kg)    General: Patient appears comfortable at rest. Neck: Supple, no elevated JVP or carotid bruits, no thyromegaly. Lungs: Clear to auscultation, nonlabored breathing at rest. Cardiac: Regular rate and rhythm, no S3, mild systolic murmur at RUSB, no pericardial rub. Extremities: No pitting edema, distal pulses 2+. Skin: Warm and dry. Musculoskeletal: No kyphosis. Neuropsychiatric: Alert and oriented x3, affect grossly appropriate.  ECG:  EKG 02/11/2020 sinus tachycardia with a rate of 101  Recent Labwork: 08/01/2020: ALT 8; AST 10; BUN 21;  Creatinine, Ser 1.64; Hemoglobin 14.1; Platelets 133.0 Repeated and verified X2.; Potassium 4.0; Sodium 140; TSH 1.61     Component Value Date/Time   CHOL 168 08/30/2014 0820   TRIG 99.0 08/30/2014 0820   HDL 27.40 (L) 08/30/2014 0820   CHOLHDL 6 08/30/2014 0820   VLDL 19.8 08/30/2014 0820   LDLCALC 121 (H) 08/30/2014 0820   LDLDIRECT 171.9 05/01/2011 0836    Other Studies Reviewed Today:  Cardiac MRI 04/08/2020 Normal left ventricular chamber size.   Severe, asymmetric left ventricular hypertrophy.   Maximal wall thickness: 18 mm at basal anteroseptum.   Hypertrophied papillary muscles.   There is systolic anterior motion of the mitral valve. 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 Mitral regurgitation is seen. Visually MR appears moderate.   No evidence of left ventricular apical aneurysm.   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 Morphologic subtype: Sigmoid.   Normal left ventricular systolic function.   LVEF = 57%   There are no regional wall motion abnormalities.   No myocardial edema, T2 is 50 msec.   First pass perfusion - mild subendocardial perfusion defect in mid lateral wall.   There is post contrast delayed myocardial enhancement. Focal delayed myocardial enhancement in the basal anteroseptum.   LGE quantitation: The late gadolinium enhancement is approximately 8% of the myocardial mass.   Normal T1 myocardial nulling kinetics suggest against a diagnosis of cardiac amyloidosis.   ECV = 27%   RIGHT VENTRICLE:   Normal right ventricular chamber size.   Normal right ventricular wall thickness.   Normal right ventricular systolic function.   RVEF = 68%   There are no regional wall motion abnormalities.   No post contrast delayed myocardial enhancement.   ATRIA:   Mild dilation of left atrial size.   Normal right atrial size.   VALVES:   No significant valvular  abnormalities.   GREAT VESSELS:   Aorta normal size, 25 mm at mid ascending aorta.   PERICARDIUM:   Normal pericardium.  No pericardial effusion.   OTHER: No significant extracardiac findings.   MEASUREMENTS: Left ventricle:   LV Female   LV EF: 57% (normal 56-78%)   Absolute volumes:   LV EDV: 48m (Normal 52-141 mL)   LV ESV: 39m(Normal 13-51 mL)   LV SV: 4877mNormal 33-97 mL)   CO: 4.0L/min (normal 2.7-6.0 L/min)   Myocardial mass: 93 grams   Indexed volumes:   LV EDV: 55m80m-m (Normal 41-81 mL/sq-m)   LV ESV: 19mL12mm (Normal 12-21 mL/sq-m)   LV SV: 26mL/62m (Normal 26-56 mL/sq-m)   CI: 2.17L/min/sq-m (Normal  1.8-3.8 L/min/sq-m)   Right ventricle:   RV female   RV EF: 68% (Normal 47-80%)   Absolute volumes:   RV EDV: 46 mL (Normal 58-154 mL)   RV ESV: 15 mL (Normal 12-68 mL)   RV SV: 31 mL (Normal 35-98 mL)   CO: 2.6 L/min (Normal 2.7-6 L/min)   Indexed volumes:   RV EDV: 25 ML/sq-m (Normal 48-87 mL/sq-m)   RV ESV: 8 mL/sq-m (Normal 11-28 mL/sq-m)   RV SV: 20 mL/sq-m (Normal 27-57 mL/sq-m)   CI: 1.4 L/min/sq-m (Normal 1.8-3.8 L/min/sq-m)   IMPRESSION: 1. Hypertrophic cardiomyopathy, sigmoid subtype. Maximal wall thickness 18 mm at basal anteroseptum.   2. 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   3. Focal delayed myocardial enhancement in basal anteroseptum. Total LGE is approximately 8% of myocardial mass.   4.  No LV apical aneurysm.   5.  Normal LV size and function, LVEF 57%.   6.  Normal RV size and function, RVEF 68%.   7. Resting first pass perfusion shows mild subendocardial perfusion defect in mid lateral wall. No associated wall motion abnormality.     Electronically Signed   By: Cherlynn Kaiser   On: 04/11/2020 09:58     Echocardiogram 03/01/2020  1. Left ventricular ejection fraction, by estimation, is 60 to 65%. The  left ventricle has normal function. The left ventricle has no regional wall motion abnormalities. There is moderate asymmetric left ventricular hypertrophy of the septal segment based on limited views (study measurements are incorrect). Left ventricular diastolic parameters are indeterminate. 2. Right ventricular systolic function is normal. The right ventricular size is normal. 3. The mitral valve is grossly normal. No evidence of mitral valve regurgitation. 4. The aortic valve is tricuspid and appears to have normal cusp excursion based on limited views. Aortic valve regurgitation is not visualized. Increased velocity across LVOT and AV likely due to asymmetric LV septal hypertrophy (possible SAM as well) although cannot exclude other source of subvalvular stenosis. Aortic valve mean gradient measures 35.0 mmHg. Aortic valve Vmax measures 4.24 m/s. Consider cardiac MRI for further evaluation. 5. The inferior vena cava is normal in size with greater than 50% respiratory variability, suggesting right atrial pressure of 3 mmHg.  Assessment and Plan:  1. Obstructive hypertrophic cardiomyopathy ([place])   2. Essential hypertension, benign     1. Obstructive hypertrophic cardiomyopathy ([place])  She states after last visit when starting the beta-blocker she had a significant reaction and only took 1 pill as a result.  States she was sweaty, having palpitations, feeling dysphoric and took no more of the medication.  States she has had similar reaction after taking morphine.  We are placing metoprolol on allergy list.  Recent evidence of hypertrophic obstructive cardiomyopathy on recent echo and cardiac MRI.  She denies any syncopal or near syncopal episodes, palpitations or arrhythmias, anginal or exertional symptoms.  Stay hydrated especially during activity and during warmer climate when dehydration is more prevalent.   Continued to advise hydration at today's visit.  2. Essential hypertension,  benign Blood pressure is elevated today.  She states at home systolic running in the 510-258N systolic range.  Please increase benazepril to 20 mg daily.    She needs to avoid any medication which may contribute to dehydration due to recent diagnosis of HOCM.  Medication Adjustments/Labs and Tests Ordered: Current medicines are reviewed at length with the patient today.  Concerns regarding medicines are outlined above.  Disposition: Follow-up with Dr. Harl Bowie or APP 3 months  Signed, Levell July, NP 09/14/2020 2:02 PM    [facility] [place], [place], [place] [address] Phone: [phone]; Fax: [phone]

## 2020-09-15 ENCOUNTER — Ambulatory Visit: Payer: 59 | Admitting: Family Medicine

## 2020-09-15 DIAGNOSIS — I421 Obstructive hypertrophic cardiomyopathy: Secondary | ICD-10-CM

## 2020-09-15 DIAGNOSIS — I10 Essential (primary) hypertension: Secondary | ICD-10-CM

## 2020-10-08 NOTE — Progress Notes (Addendum)
Cardiology Office Note  Date: 10/10/2020   ID: [name] [name], DOB [birth year], MRN [id number]  PCP:  Cassandria Anger, MD  Cardiologist:  Carlyle Dolly, MD Electrophysiologist:  None   Chief Complaint: 3 month follow up  History of Present Illness: [name] [name] is a 50 y.o. female with a history of heart murmur, thrombocytopenia.   Last seen by Dr Harl Bowie 02/11/2020.  She had been referred by Dr. Delton Coombes for cardiac murmur.  She stated she'd had a murmur for quite some time. Having some general fatigue, and occasional SOB. 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 Addendum to note on 04/25/20 mentioned echo and MRI suggest hypertrophic cardiomyopathy. Would need to follow up and likely start BB and risk stratify for ICD   She was here last visit follow-up status post recent echocardiogram and cardiac MRI with diagnosis of hypertrophic obstructive cardiomyopathy.  We discussed family history.  She stated both of her parents died of cardiac arrest but both had other issues with coronary artery disease which may have precipitated cardiac arrest.  No known history of hypertrophic cardiomyopathy in first-degree relatives.  Denied any history of syncope or near syncopal episodes.  She denies any palpitations or arrhythmias, denies any anginal symptoms or exertional symptoms.  She felt fatigued on a normal basis due to being on immunosuppressive drugs for her Crohn's disease.  Currently on Lotensin.  Cardiac MRI demonstrated: Maximal LV wall thickness was 18 mm at the basal anteroseptal.  She had systolic anterior motion of the mitral valve with flow dephasing suggesting left ventricular outflow tract obstruction.  Mitral regurgitation considered to be moderate visually.  No apical aneurysm.  LGE approximately 8% of myocardial mass.  Normal T1 myocardial kinetics suggested against a diagnosis of cardiac amyloidosis.  No WMA's.  He is here today for 5-monthfollow-up.  She denies any recent  heart related issues.  Denies any anginal or exertional symptoms, orthostatic symptoms, CVA or TIA-like symptoms, palpitations or arrhythmias, PND orthopnea, bleeding issues, claudication-like symptoms, DVT or PE-like symptoms, or lower extremity edema.  At last visit we increased benazepril to 20 mg daily which has improved her blood pressure.  Blood pressure today is 115/80.  She continues to see Dr. KDelton Coombesfor her Crohn's disease and is receiving Entyvio.  She states she recently started back smoking which she she states she is not proud of.  She is hoping she can quit.  Past Medical History:  Diagnosis Date   Anxiety    Arthritis    Dr. PEddie Dibbles  Bartholin cyst 2008   vag.   BIPOLAR AFFECTIVE DISORDER 04/14/2007   Crohn's ON ENTYVIO SINCE FEB [id number]  COMPLICATED BY RECTOVAGINAL FISTULA. SJS WITH REMICADE. FAILED HUMIRA.   Depression    Elevated glucose 2010   GERD (gastroesophageal reflux disease)    Hypertension    Kidney stone    LBP (low back pain)    Dr. WMina Marble  Perianal abscess 2009   Vitamin B12 deficiency     Past Surgical History:  Procedure Laterality Date   BALLOON DILATION N/A 10/15/2019   Procedure: BALLOON DILATION;  Surgeon: CEloise Harman DO;  [facility];  Service: Endoscopy;  Laterality: N/A;   BIOPSY  10/15/2019   Procedure: BIOPSY;  Surgeon: CEloise Harman DO;  [facility];  Service: Endoscopy;;   COLONOSCOPY  09/2008   Dr. KDerrill Kay ulcers, edema, possible fistula openings all seen in the distal 3 cm of anus/anal canal. 1cm  pseudopolyp. rest of colon and TI normal. rectal biopsy with mild chronic active colitis. ileum bx normal.   COLONOSCOPY WITH PROPOFOL N/A 03/29/2015   SLF: Severe proctocolitis limitied to cecum and rectum. Limited exam of the colon mucosa and anal canal.    COLONOSCOPY WITH PROPOFOL N/A 10/15/2019   Procedure: COLONOSCOPY WITH PROPOFOL;  Surgeon: Eloise Harman, DO;  Location: [facility];  Service:  Endoscopy;  Laterality: N/A;  10:45am   ELBOW SURGERY     left   ESOPHAGOGASTRODUODENOSCOPY (EGD) WITH PROPOFOL N/A 03/29/2015   SLF: 1. Erosive gastritis duodentitis.    ESOPHAGOGASTRODUODENOSCOPY (EGD) WITH PROPOFOL N/A 10/15/2019   Procedure: ESOPHAGOGASTRODUODENOSCOPY (EGD) WITH PROPOFOL;  Surgeon: Eloise Harman, DO;  Location: [facility];  Service: Endoscopy;  Laterality: N/A;   RECTOVAGINAL FISTULA CLOSURE     did not help    Current Outpatient Medications  Medication Sig Dispense Refill   ALPRAZolam (XANAX) 1 MG tablet Take 1 tablet (1 mg total) by mouth 2 (two) times daily. 30 tablet 2   benazepril (LOTENSIN) 20 MG tablet Take 1 tablet (20 mg total) by mouth daily. 30 tablet 6   BREO ELLIPTA 100-25 MCG/INH AEPB Inhale 1 puff into the lungs as needed.     Cholecalciferol (VITAMIN D3) 250 MCG (10000 UT) capsule Take 30,000 Units by mouth once a week.     cyanocobalamin (,VITAMIN B-12,) 1000 MCG/ML injection INJECT 1ML INTO THE SKIN EVERY 14 DAYS 6 mL 5   HYDROcodone-acetaminophen (NORCO) 10-325 MG tablet Take 1 tablet by mouth 2 (two) times daily as needed for severe pain. 60 tablet 0   HYDROcodone-acetaminophen (NORCO) 10-325 MG tablet Take 1 tablet by mouth 2 (two) times daily as needed for severe pain. 60 tablet 0   HYDROcodone-acetaminophen (NORCO) 10-325 MG tablet Take 1 tablet by mouth 2 (two) times daily as needed for severe pain. 60 tablet 0   lamoTRIgine (LAMICTAL) 200 MG tablet Take 200 mg by mouth daily.      norethindrone (AYGESTIN) 5 MG tablet Take 1 tablet (5 mg total) by mouth daily. 90 tablet 2   pantoprazole (PROTONIX) 40 MG tablet 1 po qam 90 tablet 3   potassium chloride (KLOR-CON) 10 MEQ tablet Take 1 tablet (10 mEq total) by mouth daily. 90 tablet 3   sertraline (ZOLOFT) 100 MG tablet Take 200 mg by mouth daily.     SYRINGE-NEEDLE, DISP, 3 ML (B-D SYRINGE/NEEDLE 3CC/25GX5/8) 25G X 5/8" 3 ML MISC Use to administer B12 injection every 14 days 24 each 0    vedolizumab (ENTYVIO) 300 MG injection Inject 300 mg into the vein. Every 12 weeks     No current facility-administered medications for this visit.   Allergies:  Cephalexin, Codeine, Guaifenesin, Imitrex [sumatriptan], Lopressor [metoprolol tartrate], Morphine, Remicade [infliximab], Sulfonamide derivatives, and Ultram [tramadol hcl]   Social History: The patient  reports that she has been smoking cigarettes. She has a 12.50 pack-year smoking history. She has never used smokeless tobacco. She reports that she does not drink alcohol and does not use drugs.   Family History: The patient's family history includes Breast cancer in her maternal grandmother; Cancer in her maternal aunt, maternal grandfather, and maternal grandmother; Diabetes in her mother; Early death (age of onset: 25) in her maternal grandmother; Heart attack in her father and mother; Heart disease in her father and mother.   ROS:  Please see the history of present illness. Otherwise, complete review of systems is positive for none.  All other  systems are reviewed and negative.   Physical Exam: VS:  BP 115/80   Pulse 81   Ht 5' 1"  (1.549 m)   Wt 168 lb (76.2 kg)   LMP 03/03/2014   SpO2 99%   BMI 31.74 kg/m , BMI Body mass index is 31.74 kg/m.  Wt Readings from Last 3 Encounters:  10/10/20 168 lb (76.2 kg)  08/01/20 170 lb 6.4 oz (77.3 kg)  06/09/20 174 lb (78.9 kg)    General: Patient appears comfortable at rest. Neck: Supple, no elevated JVP or carotid bruits, no thyromegaly. Lungs: Clear to auscultation, nonlabored breathing at rest. Cardiac: Regular rate and rhythm, no S3, mild systolic murmur at RUSB, no pericardial rub. Extremities: No pitting edema, distal pulses 2+. Skin: Warm and dry. Musculoskeletal: No kyphosis. Neuropsychiatric: Alert and oriented x3, affect grossly appropriate.  ECG:  EKG 02/11/2020 sinus tachycardia with a rate of 101  Recent Labwork: 08/01/2020: ALT 8; AST 10; BUN 21; Creatinine, Ser  1.64; Hemoglobin 14.1; Platelets 133.0 Repeated and verified X2.; Potassium 4.0; Sodium 140; TSH 1.61     Component Value Date/Time   CHOL 168 08/30/2014 0820   TRIG 99.0 08/30/2014 0820   HDL 27.40 (L) 08/30/2014 0820   CHOLHDL 6 08/30/2014 0820   VLDL 19.8 08/30/2014 0820   LDLCALC 121 (H) 08/30/2014 0820   LDLDIRECT 171.9 05/01/2011 0836    Other Studies Reviewed Today:  Cardiac MRI 04/11/2020 Normal left ventricular chamber size.   Severe, asymmetric left ventricular hypertrophy.   Maximal wall thickness: 18 mm at basal anteroseptum.   Hypertrophied papillary muscles.   There is systolic anterior motion of the mitral valve. 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 Mitral regurgitation is seen. Visually MR appears moderate.   No evidence of left ventricular apical aneurysm.   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 Morphologic subtype: Sigmoid.   Normal left ventricular systolic function.   LVEF = 57%   There are no regional wall motion abnormalities.   No myocardial edema, T2 is 50 msec.   First pass perfusion - mild subendocardial perfusion defect in mid lateral wall.   There is post contrast delayed myocardial enhancement. Focal delayed myocardial enhancement in the basal anteroseptum.   LGE quantitation: The late gadolinium enhancement is approximately 8% of the myocardial mass.   Normal T1 myocardial nulling kinetics suggest against a diagnosis of cardiac amyloidosis.   ECV = 27%   RIGHT VENTRICLE:   Normal right ventricular chamber size.   Normal right ventricular wall thickness.   Normal right ventricular systolic function.   RVEF = 68%   There are no regional wall motion abnormalities.   No post contrast delayed myocardial enhancement.   ATRIA:   Mild dilation of left atrial size.   Normal right atrial size.   VALVES:   No significant valvular abnormalities.   GREAT  VESSELS:   Aorta normal size, 25 mm at mid ascending aorta.   PERICARDIUM:   Normal pericardium.  No pericardial effusion.   OTHER: No significant extracardiac findings.   MEASUREMENTS: Left ventricle:   LV Female   LV EF: 57% (normal 56-78%)   Absolute volumes:   LV EDV: 15m (Normal 52-141 mL)   LV ESV: 312m(Normal 13-51 mL)   LV SV: 4812mNormal 33-97 mL)   CO: 4.0L/min (normal 2.7-6.0 L/min)   Myocardial mass: 93 grams   Indexed volumes:   LV EDV: 49m31m-m (Normal 41-81 mL/sq-m)   LV ESV: 19mL42mm (Normal 12-21 mL/sq-m)  LV SV: 71m/sq-m (Normal 26-56 mL/sq-m)   CI: 2.17L/min/sq-m (Normal 1.8-3.8 L/min/sq-m)   Right ventricle:   RV female   RV EF: 68% (Normal 47-80%)   Absolute volumes:   RV EDV: 46 mL (Normal 58-154 mL)   RV ESV: 15 mL (Normal 12-68 mL)   RV SV: 31 mL (Normal 35-98 mL)   CO: 2.6 L/min (Normal 2.7-6 L/min)   Indexed volumes:   RV EDV: 25 ML/sq-m (Normal 48-87 mL/sq-m)   RV ESV: 8 mL/sq-m (Normal 11-28 mL/sq-m)   RV SV: 20 mL/sq-m (Normal 27-57 mL/sq-m)   CI: 1.4 L/min/sq-m (Normal 1.8-3.8 L/min/sq-m)   IMPRESSION: 1. Hypertrophic cardiomyopathy, sigmoid subtype. Maximal wall thickness 18 mm at basal anteroseptum.   2. 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   3. Focal delayed myocardial enhancement in basal anteroseptum. Total LGE is approximately 8% of myocardial mass.   4.  No LV apical aneurysm.   5.  Normal LV size and function, LVEF 57%.   6.  Normal RV size and function, RVEF 68%.   7. Resting first pass perfusion shows mild subendocardial perfusion defect in mid lateral wall. No associated wall motion abnormality.     Electronically Signed   By: GCherlynn Kaiser  On: 04/11/2020 09:58     Echocardiogram 03/01/2020  1. Left ventricular ejection fraction, by estimation, is 60 to 65%. The left ventricle has  normal function. The left ventricle has no regional wall motion abnormalities. There is moderate asymmetric left ventricular hypertrophy of the septal segment based on limited views (study measurements are incorrect). Left ventricular diastolic parameters are indeterminate. 2. Right ventricular systolic function is normal. The right ventricular size is normal. 3. The mitral valve is grossly normal. No evidence of mitral valve regurgitation. 4. The aortic valve is tricuspid and appears to have normal cusp excursion based on limited views. Aortic valve regurgitation is not visualized. Increased velocity across LVOT and AV likely due to asymmetric LV septal hypertrophy (possible SAM as well) although cannot exclude other source of subvalvular stenosis. Aortic valve mean gradient measures 35.0 mmHg. Aortic valve Vmax measures 4.24 m/s. Consider cardiac MRI for further evaluation. 5. The inferior vena cava is normal in size with greater than 50% respiratory variability, suggesting right atrial pressure of 3 mmHg.  Assessment and Plan:  1. Obstructive hypertrophic cardiomyopathy ([place]   2. Essential hypertension, benign      1. Obstructive hypertrophic cardiomyopathy (HCC)   Recent evidence of hypertrophic obstructive cardiomyopathy on recent echo and cardiac MRI.  She denies any syncopal or near syncopal episodes, palpitations or arrhythmias, anginal or exertional symptoms.  Advised her to stay hydrated especially during activity and during warmer climate when dehydration is more prevalent.   Continued to advise hydration at today's visit.  2. Essential hypertension, benign Blood pressure is much better controlled since increasing benazepril to 20 mg daily.  Blood pressure today is 115/80.  She states she is feeling well.  3.  Smoking States she recently started back smoking.  She is not proud of the decision to start back.  She is hoping to stop in the future.  Advised cessation  Medication  Adjustments/Labs and Tests Ordered: Current medicines are reviewed at length with the patient today.  Concerns regarding medicines are outlined above.   Disposition: Follow-up with Dr. BHarl Bowieor APP 6 months  Signed, ALevell July NP 10/10/2020 8:38 AM    [facility] [place], [place], [place] [address] Phone: [phone]; Fax: [phone]

## 2020-10-10 ENCOUNTER — Ambulatory Visit (INDEPENDENT_AMBULATORY_CARE_PROVIDER_SITE_OTHER): Payer: 59 | Admitting: Family Medicine

## 2020-10-10 ENCOUNTER — Encounter: Payer: Self-pay | Admitting: Family Medicine

## 2020-10-10 VITALS — BP 115/80 | HR 81 | Ht 61.0 in | Wt 168.0 lb

## 2020-10-10 DIAGNOSIS — I421 Obstructive hypertrophic cardiomyopathy: Secondary | ICD-10-CM

## 2020-10-10 DIAGNOSIS — I10 Essential (primary) hypertension: Secondary | ICD-10-CM

## 2020-10-10 NOTE — Patient Instructions (Signed)
Medication Instructions:  Continue all current medications.   Labwork: none  Testing/Procedures: none  Follow-Up: 6 months   Any Other Special Instructions Will Be Listed Below (If Applicable).   If you need a refill on your cardiac medications before your next appointment, please call your pharmacy.  

## 2020-10-11 ENCOUNTER — Ambulatory Visit: Payer: 59 | Admitting: Family Medicine

## 2020-11-04 ENCOUNTER — Other Ambulatory Visit: Payer: Self-pay

## 2020-11-07 ENCOUNTER — Encounter: Payer: Self-pay | Admitting: Internal Medicine

## 2020-11-07 ENCOUNTER — Ambulatory Visit (INDEPENDENT_AMBULATORY_CARE_PROVIDER_SITE_OTHER): Payer: 59 | Admitting: Internal Medicine

## 2020-11-07 ENCOUNTER — Other Ambulatory Visit: Payer: Self-pay

## 2020-11-07 VITALS — BP 130/72 | HR 100 | Temp 98.1°F | Ht 61.0 in | Wt 170.4 lb

## 2020-11-07 DIAGNOSIS — G8929 Other chronic pain: Secondary | ICD-10-CM

## 2020-11-07 DIAGNOSIS — E538 Deficiency of other specified B group vitamins: Secondary | ICD-10-CM | POA: Diagnosis not present

## 2020-11-07 DIAGNOSIS — K50013 Crohn's disease of small intestine with fistula: Secondary | ICD-10-CM | POA: Diagnosis not present

## 2020-11-07 DIAGNOSIS — M5440 Lumbago with sciatica, unspecified side: Secondary | ICD-10-CM | POA: Diagnosis not present

## 2020-11-07 DIAGNOSIS — F419 Anxiety disorder, unspecified: Secondary | ICD-10-CM

## 2020-11-07 DIAGNOSIS — Z23 Encounter for immunization: Secondary | ICD-10-CM

## 2020-11-07 DIAGNOSIS — N183 Chronic kidney disease, stage 3 unspecified: Secondary | ICD-10-CM

## 2020-11-07 MED ORDER — HYDROCODONE-ACETAMINOPHEN 10-325 MG PO TABS: 1.0000 | ORAL_TABLET | Freq: Two times a day (BID) | ORAL | 0 refills | Status: DC | PRN

## 2020-11-07 MED ORDER — CYANOCOBALAMIN 1000 MCG/ML IJ SOLN
1000.0000 ug | Freq: Once | INTRAMUSCULAR | Status: AC
  Administered 2020-11-07: 1000 ug via INTRAMUSCULAR

## 2020-11-07 NOTE — Assessment & Plan Note (Signed)
Chronic Xanax prn  Potential benefits of a long term benzodiazepines  use as well as potential risks  and complications were explained to the patient and were aknowledged. Do not take w/pain meds.

## 2020-11-07 NOTE — Assessment & Plan Note (Signed)
Cont w/Vit B12

## 2020-11-07 NOTE — Assessment & Plan Note (Signed)
Dr Abbey Chatters 2/17 colonoscopy w/proctocolitis. On Entyvio IV q 2 mo 2022 On IV Entyvio q 12 wks Norco prn. Rare Toradol prn pain On disabiity   Potential benefits of a long term opioids use as well as potential risks (i.e. addiction risk, apnea etc) and complications (i.e. Somnolence, constipation and others) were explained to the patient and were aknowledged.

## 2020-11-07 NOTE — Addendum Note (Signed)
Addended by: Earnstine Regal on: 11/07/2020 08:45 AM   Modules accepted: Orders

## 2020-11-07 NOTE — Progress Notes (Signed)
Subjective:  Patient ID: [name] [name], female    DOB: Jul 14, 1970  Age: 50 y.o. MRN: [id number]  CC: Follow-up (3 month f/u)   HPI [name] [name] presents for Vit B12 def, CRI, Crohn's, LBP. Labs are pending w/Hematology  Outpatient Medications Prior to Visit  Medication Sig Dispense Refill   ALPRAZolam (XANAX) 1 MG tablet Take 1 tablet (1 mg total) by mouth 2 (two) times daily. 30 tablet 2   benazepril (LOTENSIN) 20 MG tablet Take 1 tablet (20 mg total) by mouth daily. 30 tablet 6   BREO ELLIPTA 100-25 MCG/INH AEPB Inhale 1 puff into the lungs as needed.     Cholecalciferol (VITAMIN D3) 250 MCG (10000 UT) capsule Take 30,000 Units by mouth once a week.     cyanocobalamin (,VITAMIN B-12,) 1000 MCG/ML injection INJECT 1ML INTO THE SKIN EVERY 14 DAYS 6 mL 5   HYDROcodone-acetaminophen (NORCO) 10-325 MG tablet Take 1 tablet by mouth 2 (two) times daily as needed for severe pain. 60 tablet 0   lamoTRIgine (LAMICTAL) 200 MG tablet Take 200 mg by mouth daily.      norethindrone (AYGESTIN) 5 MG tablet Take 1 tablet (5 mg total) by mouth daily. 90 tablet 2   pantoprazole (PROTONIX) 40 MG tablet 1 po qam 90 tablet 3   potassium chloride (KLOR-CON) 10 MEQ tablet Take 1 tablet (10 mEq total) by mouth daily. 90 tablet 3   sertraline (ZOLOFT) 100 MG tablet Take 200 mg by mouth daily.     SYRINGE-NEEDLE, DISP, 3 ML (B-D SYRINGE/NEEDLE 3CC/25GX5/8) 25G X 5/8" 3 ML MISC Use to administer B12 injection every 14 days 24 each 0   vedolizumab (ENTYVIO) 300 MG injection Inject 300 mg into the vein. Every 12 weeks     HYDROcodone-acetaminophen (NORCO) 10-325 MG tablet Take 1 tablet by mouth 2 (two) times daily as needed for severe pain. 60 tablet 0   HYDROcodone-acetaminophen (NORCO) 10-325 MG tablet Take 1 tablet by mouth 2 (two) times daily as needed for severe pain. 60 tablet 0   No facility-administered medications prior to visit.    ROS: Review of Systems  Constitutional:  Negative for activity  change, appetite change, chills, fatigue and unexpected weight change.  HENT:  Negative for congestion, mouth sores and sinus pressure.   Eyes:  Negative for visual disturbance.  Respiratory:  Negative for cough and chest tightness.   Gastrointestinal:  Negative for abdominal pain and nausea.  Genitourinary:  Negative for difficulty urinating, frequency and vaginal pain.  Musculoskeletal:  Negative for back pain and gait problem.  Skin:  Negative for pallor and rash.  Neurological:  Negative for dizziness, tremors, weakness, numbness and headaches.  Psychiatric/Behavioral:  Negative for confusion, sleep disturbance and suicidal ideas.    Objective:  BP 130/72 (BP Location: Left Arm)   Pulse 100   Temp 98.1 F (36.7 C) (Oral)   Ht 5' 1"  (1.549 m)   Wt 170 lb 6.4 oz (77.3 kg)   LMP 03/03/2014   SpO2 99%   BMI 32.20 kg/m   BP Readings from Last 3 Encounters:  11/07/20 130/72  10/10/20 115/80  08/30/20 136/78    Wt Readings from Last 3 Encounters:  11/07/20 170 lb 6.4 oz (77.3 kg)  10/10/20 168 lb (76.2 kg)  08/01/20 170 lb 6.4 oz (77.3 kg)    Physical Exam Constitutional:      General: She is not in acute distress.    Appearance: She is well-developed. She is obese.  HENT:     Head: Normocephalic.     Right Ear: External ear normal.     Left Ear: External ear normal.     Nose: Nose normal.  Eyes:     General:        Right eye: No discharge.        Left eye: No discharge.     Conjunctiva/sclera: Conjunctivae normal.     Pupils: Pupils are equal, round, and reactive to light.  Neck:     Thyroid: No thyromegaly.     Vascular: No JVD.     Trachea: No tracheal deviation.  Cardiovascular:     Rate and Rhythm: Normal rate and regular rhythm.     Heart sounds: Normal heart sounds.  Pulmonary:     Effort: No respiratory distress.     Breath sounds: No stridor. No wheezing.  Abdominal:     General: Bowel sounds are normal. There is no distension.     Palpations:  Abdomen is soft. There is no mass.     Tenderness: There is no abdominal tenderness. There is no guarding or rebound.  Musculoskeletal:        General: Tenderness present.     Cervical back: Normal range of motion and neck supple. No rigidity.  Lymphadenopathy:     Cervical: No cervical adenopathy.  Skin:    Findings: No erythema or rash.  Neurological:     Mental Status: She is oriented to person, place, and time.     Cranial Nerves: No cranial nerve deficit.     Motor: No abnormal muscle tone.     Coordination: Coordination normal.     Gait: Gait abnormal.     Deep Tendon Reflexes: Reflexes normal.  Psychiatric:        Behavior: Behavior normal.        Thought Content: Thought content normal.        Judgment: Judgment normal.    Lab Results  Component Value Date   WBC 7.4 08/01/2020   HGB 14.1 08/01/2020   HCT 41.1 08/01/2020   PLT 133.0 Repeated and verified X2. (L) 08/01/2020   GLUCOSE 75 08/01/2020   CHOL 168 08/30/2014   TRIG 99.0 08/30/2014   HDL 27.40 (L) 08/30/2014   LDLDIRECT 171.9 05/01/2011   LDLCALC 121 (H) 08/30/2014   ALT 8 08/01/2020   AST 10 08/01/2020   NA 140 08/01/2020   K 4.0 08/01/2020   CL 105 08/01/2020   CREATININE 1.64 (H) 08/01/2020   BUN 21 08/01/2020   CO2 27 08/01/2020   TSH 1.61 08/01/2020    No results found.  Assessment & Plan:   Problem List Items Addressed This Visit     Anxiety    Chronic Xanax prn  Potential benefits of a long term benzodiazepines  use as well as potential risks  and complications were explained to the patient and were aknowledged. Do not take w/pain meds.      B12 nutritional deficiency    Cont w/Vit B12      CRI (chronic renal insufficiency), stage 3 (moderate) (HCC)    GFR 36 No NSAIDs Toradol - very rare, pt is insisting on using it Nephrology ref      Relevant Orders   Ambulatory referral to Nephrology   Crohn's disease of ileum with fistula [facility])    Dr Abbey Chatters 2/17 colonoscopy  w/proctocolitis. On Entyvio IV q 2 mo 2022 On IV Entyvio q 12 wks Norco prn. Rare Toradol prn pain On  disabiity   Potential benefits of a long term opioids use as well as potential risks (i.e. addiction risk, apnea etc) and complications (i.e. Somnolence, constipation and others) were explained to the patient and were aknowledged.      LOW BACK PAIN, CHRONIC - Primary    Chronic and severe Norco prn. Rare Toradol prn On disabiity   Potential benefits of a long term opioids use as well as potential risks (i.e. addiction risk, apnea etc) and complications (i.e. Somnolence, constipation and others) were explained to the patient and were aknowledged.         Follow-up: Return in about 3 months (around 02/06/2021) for a follow-up visit.  [name], MD

## 2020-11-07 NOTE — Assessment & Plan Note (Addendum)
GFR 36 No NSAIDs Toradol - very rare, pt is insisting on using it Nephrology ref

## 2020-11-07 NOTE — Assessment & Plan Note (Addendum)
Chronic and severe Norco prn. Rare Toradol prn On disabiity   Potential benefits of a long term opioids use as well as potential risks (i.e. addiction risk, apnea etc) and complications (i.e. Somnolence, constipation and others) were explained to the patient and were aknowledged.

## 2020-11-10 ENCOUNTER — Other Ambulatory Visit: Payer: Self-pay

## 2020-11-10 ENCOUNTER — Encounter: Payer: Self-pay | Admitting: Internal Medicine

## 2020-11-10 ENCOUNTER — Ambulatory Visit (INDEPENDENT_AMBULATORY_CARE_PROVIDER_SITE_OTHER): Payer: 59 | Admitting: Internal Medicine

## 2020-11-10 ENCOUNTER — Encounter: Payer: Self-pay | Admitting: *Deleted

## 2020-11-10 ENCOUNTER — Other Ambulatory Visit: Payer: Self-pay | Admitting: *Deleted

## 2020-11-10 VITALS — BP 120/81 | HR 90 | Temp 97.6°F | Ht 62.0 in | Wt 171.2 lb

## 2020-11-10 DIAGNOSIS — K50013 Crohn's disease of small intestine with fistula: Secondary | ICD-10-CM

## 2020-11-10 DIAGNOSIS — N823 Fistula of vagina to large intestine: Secondary | ICD-10-CM

## 2020-11-10 DIAGNOSIS — K219 Gastro-esophageal reflux disease without esophagitis: Secondary | ICD-10-CM | POA: Diagnosis not present

## 2020-11-10 NOTE — Progress Notes (Signed)
Referring Provider: Cassandria Anger, MD Primary Care Physician:  Cassandria Anger, MD Primary GI:  Dr. Abbey Chatters  Chief Complaint  Patient presents with   Follow-up    HPI:   [name] [name] is a 50 y.o. female who presents to the clinic today for follow up visit. 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  She established care with [place] in January 2017.  She has previously failed Imuran, Humira, Asacol.  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  She was reportedly in a research trial at [facility] for stem cell research at one point.     According to gynecology note dated 05/09/2018 noted a draining rectovaginal fistula with left buttock fluid collection.  Cultures was sent and found to be staph aureus.  [name] was held during this time which she has been maintained on since 2017.  CT pelvis in June showed evidence of rectovaginal fistula unchanged from 2018.  Patient was continued on Entyvio with most recent CT in December 10, 2018 which showed a nodular density posterior to the rectum likely representing scarring related to prior inflammatory changes collection.  No drainable fluid identified   Patient was hospitalized in October 2020 with sepsis due to E. coli bacteremia related to UTI as well as multifocal pneumonia and aspiration.   She continues to be maintained on Entyvio.  From a GI standpoint, she is doing well.  Notes normal bowel movements.  No melena or hematochezia.  She feels as though her rectovaginal fistula is healing for the most part.   We changed her frequency from every 8 weeks to every 12 weeks as she believed this would give her a better chance of healing her fistula.  She states her GI symptoms are well controlled.  No diarrhea.  No hematochezia.  Does note some mucus related to her fistula.  Otherwise doing well     Procedures: EGD and colonoscopy on 10/15/2019.  Her colon looked remarkably healthy with negative biopsies for any inflammation throughout her entire colon.  Her terminal ileum also looked healthy with negative biopsies as well.  She did have one small tubular adenoma removed. Her EGD showed a Schatzki's ring in her distal esophagus which was dilated with an 18 mm balloon.  She also had gastritis.  Upper endoscopy biopsies were relatively unremarkable besides chronic gastritis.   EGD and colonoscopy in February 2017 showed inflammation and ulceration in the cecum involving the IC valve which was unable to be intubated with colonoscope.  Also noted severe proctitis as well.  EGD showed erosive gastritis duodenitis.  Biopsies from cecal right colon with chronic active colitis and chronic inactive colitis in the rectum.  Negative for dysplasia.  Past Medical History:  Diagnosis Date   Anxiety    Arthritis    Dr. Eddie Dibbles   Bartholin cyst 2008   vag.   BIPOLAR AFFECTIVE DISORDER 04/14/2007   Crohn's ON ENTYVIO SINCE FEB [id number]   COMPLICATED BY RECTOVAGINAL FISTULA. SJS WITH REMICADE. FAILED HUMIRA.   Depression    Elevated glucose 2010   GERD (gastroesophageal reflux disease)    Hypertension    Kidney stone    LBP (low back pain)    Dr. Mina Marble   Perianal abscess 2009   Vitamin B12 deficiency     Past Surgical History:  Procedure Laterality Date   BALLOON DILATION N/A 10/15/2019  Procedure: BALLOON DILATION;  Surgeon: Eloise Harman, DO;  [facility];  Service: Endoscopy;  Laterality: N/A;   BIOPSY  10/15/2019   Procedure: BIOPSY;  Surgeon: Eloise Harman, DO;  [facility];  Service: Endoscopy;;   COLONOSCOPY  09/2008   Dr. Derrill Kay: ulcers, edema, possible fistula openings all seen in the distal 3 cm of anus/anal canal. 1cm pseudopolyp. rest of colon and TI normal. rectal biopsy with mild chronic active colitis. ileum bx normal.   COLONOSCOPY WITH PROPOFOL N/A  03/29/2015   SLF: Severe proctocolitis limitied to cecum and rectum. Limited exam of the colon mucosa and anal canal.    COLONOSCOPY WITH PROPOFOL N/A 10/15/2019   Procedure: COLONOSCOPY WITH PROPOFOL;  Surgeon: Eloise Harman, DO;  [facility];  Service: Endoscopy;  Laterality: N/A;  10:45am   ELBOW SURGERY     left   ESOPHAGOGASTRODUODENOSCOPY (EGD) WITH PROPOFOL N/A 03/29/2015   SLF: 1. Erosive gastritis duodentitis.    ESOPHAGOGASTRODUODENOSCOPY (EGD) WITH PROPOFOL N/A 10/15/2019   Procedure: ESOPHAGOGASTRODUODENOSCOPY (EGD) WITH PROPOFOL;  Surgeon: Eloise Harman, DO;  [facility];  Service: Endoscopy;  Laterality: N/A;   RECTOVAGINAL FISTULA CLOSURE     did not help    Current Outpatient Medications  Medication Sig Dispense Refill   ALPRAZolam (XANAX) 1 MG tablet Take 1 tablet (1 mg total) by mouth 2 (two) times daily. 30 tablet 2   benazepril (LOTENSIN) 20 MG tablet Take 1 tablet (20 mg total) by mouth daily. 30 tablet 6   BREO ELLIPTA 100-25 MCG/INH AEPB Inhale 1 puff into the lungs as needed.     Cholecalciferol (VITAMIN D3) 250 MCG (10000 UT) capsule Take 30,000 Units by mouth once a week.     cyanocobalamin (,VITAMIN B-12,) 1000 MCG/ML injection INJECT 1ML INTO THE SKIN EVERY 14 DAYS 6 mL 5   HYDROcodone-acetaminophen (NORCO) 10-325 MG tablet Take 1 tablet by mouth 2 (two) times daily as needed for severe pain. 60 tablet 0   HYDROcodone-acetaminophen (NORCO) 10-325 MG tablet Take 1 tablet by mouth 2 (two) times daily as needed for severe pain. 60 tablet 0   HYDROcodone-acetaminophen (NORCO) 10-325 MG tablet Take 1 tablet by mouth 2 (two) times daily as needed for severe pain. 60 tablet 0   lamoTRIgine (LAMICTAL) 200 MG tablet Take 200 mg by mouth daily.      norethindrone (AYGESTIN) 5 MG tablet Take 1 tablet (5 mg total) by mouth daily. 90 tablet 2   pantoprazole (PROTONIX) 40 MG tablet 1 po qam 90 tablet 3   potassium chloride (KLOR-CON) 10 MEQ tablet  Take 1 tablet (10 mEq total) by mouth daily. 90 tablet 3   sertraline (ZOLOFT) 100 MG tablet Take 200 mg by mouth daily.     SYRINGE-NEEDLE, DISP, 3 ML (B-D SYRINGE/NEEDLE 3CC/25GX5/8) 25G X 5/8" 3 ML MISC Use to administer B12 injection every 14 days 24 each 0   vedolizumab (ENTYVIO) 300 MG injection Inject 300 mg into the vein. Every 12 weeks     No current facility-administered medications for this visit.    Allergies as of 11/10/2020 - Review Complete 11/10/2020  Allergen Reaction Noted   Cephalexin Hives and Itching    Codeine Nausea Only    Guaifenesin     Imitrex [sumatriptan]  10/17/2011   Lopressor [metoprolol tartrate] Nausea Only and Other (See Comments) 06/09/2020   Morphine     Remicade [infliximab]  05/26/2015   Sulfonamide derivatives  Ultram [tramadol hcl]  05/26/2015    Family History  Problem Relation Age of Onset   Diabetes Mother    Heart disease Mother    Heart attack Mother    Cancer Maternal Aunt        ovarian   Breast cancer Maternal Grandmother    Early death Maternal Grandmother 23   Cancer Maternal Grandmother        breast   Heart attack Father    Heart disease Father    Cancer Maternal Grandfather        colon    Social History   Socioeconomic History   Marital status: Married    Spouse name: Not on file   Number of children: Not on file   Years of education: Not on file   Highest education level: Not on file  Occupational History   Not on file  Tobacco Use   Smoking status: Every Day    Packs/day: 0.50    Years: 25.00    Pack years: 12.50    Types: Cigarettes    Last attempt to quit: 12/21/2018    Years since quitting: 1.8   Smokeless tobacco: Never   Tobacco comments:    Started back   Vaping Use   Vaping Use: Never used  Substance and Sexual Activity   Alcohol use: No   Drug use: No   Sexual activity: Not Currently    Partners: Male    Birth control/protection: Pill  Other Topics Concern   Not on file  Social  History Narrative   Regular Exercise-no   Social Determinants of Health   Financial Resource Strain: Low Risk    Difficulty of Paying Living Expenses: Not very hard  Food Insecurity: No Food Insecurity   Worried About Charity fundraiser in the Last Year: Never true   Ran Out of Food in the Last Year: Never true  Transportation Needs: No Transportation Needs   Lack of Transportation (Medical): No   Lack of Transportation (Non-Medical): No  Physical Activity: Insufficiently Active   Days of Exercise per Week: 3 days   Minutes of Exercise per Session: 20 min  Stress: No Stress Concern Present   Feeling of Stress : Not at all  Social Connections: Moderately Isolated   Frequency of Communication with Friends and Family: Twice a week   Frequency of Social Gatherings with Friends and Family: Twice a week   Attends Religious Services: More than 4 times per year   Active Member of [employer] or Organizations: No   Attends Archivist Meetings: Never   Marital Status: Separated    Subjective: Review of Systems  Constitutional:  Negative for chills and fever.  HENT:  Negative for congestion and hearing loss.   Eyes:  Negative for blurred vision and double vision.  Respiratory:  Negative for cough and shortness of breath.   Cardiovascular:  Negative for chest pain and palpitations.  Gastrointestinal:  Negative for abdominal pain, blood in stool, constipation, diarrhea, heartburn, melena and vomiting.  Genitourinary:  Negative for dysuria and urgency.  Musculoskeletal:  Negative for joint pain and myalgias.  Skin:  Negative for itching and rash.  Neurological:  Negative for dizziness and headaches.  Psychiatric/Behavioral:  Negative for depression. The patient is not nervous/anxious.     Objective: BP 120/81   Pulse 90   Temp 97.6 F (36.4 C) (Temporal)   Ht 5' 2"  (1.575 m)   Wt 171 lb 3.2 oz (77.7 kg)  LMP 03/03/2014   BMI 31.31 kg/m  Physical Exam Constitutional:       Appearance: Normal appearance.  HENT:     Head: Normocephalic and atraumatic.  Eyes:     Extraocular Movements: Extraocular movements intact.     Conjunctiva/sclera: Conjunctivae normal.  Cardiovascular:     Rate and Rhythm: Normal rate and regular rhythm.  Pulmonary:     Effort: Pulmonary effort is normal.     Breath sounds: Normal breath sounds.  Abdominal:     General: Bowel sounds are normal.     Palpations: Abdomen is soft.  Musculoskeletal:        General: No swelling. Normal range of motion.     Cervical back: Normal range of motion and neck supple.  Skin:    General: Skin is warm and dry.     Coloration: Skin is not jaundiced.  Neurological:     General: No focal deficit present.     Mental Status: She is alert and oriented to person, place, and time.  Psychiatric:        Mood and Affect: Mood normal.        Behavior: Behavior normal.     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  Continue on Entyvio which she has been tolerating for 4 years now.    We changed her frequency to every 12 weeks as patient believes this would give her a better chance of healing her fistula.  I again discussed that by doing so we run the risk of flaring up her Crohn's disease as well as increase the risk of antibody formation to [facility] she understands.  Patient told to call office if she has any signs of Crohn's flare including diarrhea, abdominal pain, rectal bleeding.  Most recent blood work in June was stable.  We will update blood work today.  Patient has received flu vaccine and COVID booster.  Recommend she receive pneumonia vaccine, she will discuss further with her PCP.  I will order DEXA scan to evaluate for osteoporosis.   Counseled she will need this every 2 years.   Colonoscopy recall in 2-3 years pending clinical course.    Continue on PPI for chronic reflux.   Follow-up with me in 6 months or sooner if needed.  11/10/2020 9:35 AM   Disclaimer: This note was dictated with voice recognition software. Similar sounding words can inadvertently be transcribed and may not be corrected upon review.

## 2020-11-10 NOTE — Patient Instructions (Signed)
I am happy to hear you are doing well today.  I will order DEXA scan to evaluate for osteoporosis.  We will need to do this every 2 years.  Continue on pantoprazole for your chronic reflux.  Continue on Entyvio for your Crohn's disease.  Recommend you receive pneumonia vaccine.  Please discuss with your primary care physician in regards to this.  Otherwise follow-up with GI in 6 months or sooner if needed.  It was great seeing you again today.  Dr. Abbey Chatters  At [facility] we value your feedback. You may receive a survey about your visit today. Please share your experience as we strive to create trusting relationships with our patients to provide genuine, compassionate, quality care.  We appreciate your understanding and patience as we review any laboratory studies, imaging, and other diagnostic tests that are ordered as we care for you. Our office policy is 5 business days for review of these results, and any emergent or urgent results are addressed in a timely manner for your best interest. If you do not hear from our office in 1 week, please contact us.   We also encourage the use of MyChart, which contains your medical information for your review as well. If you are not enrolled in this feature, an access code is on this after visit summary for your convenience. Thank you for allowing [place] to be involved in your care.  It was great to see you today!  I hope you have a great rest of your summer!!    Elon Alas. Abbey Chatters, D.O. Gastroenterology and Hepatology [facility]

## 2020-11-16 ENCOUNTER — Other Ambulatory Visit: Payer: Self-pay

## 2020-11-16 ENCOUNTER — Ambulatory Visit (HOSPITAL_COMMUNITY)
Admission: RE | Admit: 2020-11-16 | Discharge: 2020-11-16 | Disposition: A | Discharge status: Home / Self Care | Payer: 59 | Source: Ambulatory Visit | Attending: Internal Medicine | Admitting: Internal Medicine

## 2020-11-16 DIAGNOSIS — K50013 Crohn's disease of small intestine with fistula: Secondary | ICD-10-CM | POA: Diagnosis not present

## 2020-11-16 DIAGNOSIS — Z13820 Encounter for screening for osteoporosis: Secondary | ICD-10-CM | POA: Insufficient documentation

## 2020-11-16 DIAGNOSIS — E559 Vitamin D deficiency, unspecified: Secondary | ICD-10-CM | POA: Diagnosis not present

## 2020-11-16 DIAGNOSIS — Z7952 Long term (current) use of systemic steroids: Secondary | ICD-10-CM | POA: Diagnosis not present

## 2020-11-22 ENCOUNTER — Encounter (HOSPITAL_COMMUNITY)
Admission: RE | Admit: 2020-11-22 | Discharge: 2020-11-22 | Disposition: A | Discharge status: Home / Self Care | Payer: 59 | Source: Ambulatory Visit | Attending: Gastroenterology | Admitting: Gastroenterology

## 2020-11-22 ENCOUNTER — Encounter (HOSPITAL_COMMUNITY): Payer: Self-pay

## 2020-11-22 DIAGNOSIS — D696 Thrombocytopenia, unspecified: Secondary | ICD-10-CM | POA: Insufficient documentation

## 2020-11-22 DIAGNOSIS — K50013 Crohn's disease of small intestine with fistula: Secondary | ICD-10-CM | POA: Diagnosis not present

## 2020-11-22 LAB — CBC WITH DIFFERENTIAL/PLATELET
Abs Immature Granulocytes: 0.04 10*3/uL (ref 0.00–0.07)
Basophils Absolute: 0 10*3/uL (ref 0.0–0.1)
Basophils Relative: 0 %
Eosinophils Absolute: 0 10*3/uL (ref 0.0–0.5)
Eosinophils Relative: 1 %
HCT: 42.6 % (ref 36.0–46.0)
Hemoglobin: 14.6 g/dL (ref 12.0–15.0)
Immature Granulocytes: 1 %
Lymphocytes Relative: 22 %
Lymphs Abs: 1.5 10*3/uL (ref 0.7–4.0)
MCH: 32.5 pg (ref 26.0–34.0)
MCHC: 34.3 g/dL (ref 30.0–36.0)
MCV: 94.9 fL (ref 80.0–100.0)
Monocytes Absolute: 0.3 10*3/uL (ref 0.1–1.0)
Monocytes Relative: 5 %
Neutro Abs: 5.2 10*3/uL (ref 1.7–7.7)
Neutrophils Relative %: 71 %
Platelets: 132 10*3/uL — ABNORMAL LOW (ref 150–400)
RBC: 4.49 MIL/uL (ref 3.87–5.11)
RDW: 13.8 % (ref 11.5–15.5)
WBC: 7.1 10*3/uL (ref 4.0–10.5)
nRBC: 0 % (ref 0.0–0.2)

## 2020-11-22 LAB — COMPREHENSIVE METABOLIC PANEL
ALT: 13 U/L (ref 0–44)
AST: 16 U/L (ref 15–41)
Albumin: 4.5 g/dL (ref 3.5–5.0)
Alkaline Phosphatase: 58 U/L (ref 38–126)
Anion gap: 7 (ref 5–15)
BUN: 19 mg/dL (ref 6–20)
CO2: 24 mmol/L (ref 22–32)
Calcium: 8.9 mg/dL (ref 8.9–10.3)
Chloride: 106 mmol/L (ref 98–111)
Creatinine, Ser: 1.48 mg/dL — ABNORMAL HIGH (ref 0.44–1.00)
GFR, Estimated: 43 mL/min — ABNORMAL LOW (ref >60)
Glucose, Bld: 123 mg/dL — ABNORMAL HIGH (ref 70–99)
Potassium: 3.8 mmol/L (ref 3.5–5.1)
Sodium: 137 mmol/L (ref 135–145)
Total Bilirubin: 0.9 mg/dL (ref 0.3–1.2)
Total Protein: 7.3 g/dL (ref 6.5–8.1)

## 2020-11-22 MED ORDER — VEDOLIZUMAB 300 MG IV SOLR
300.0000 mg | Freq: Once | INTRAVENOUS | Status: AC
  Administered 2020-11-22: 300 mg via INTRAVENOUS
  Filled 2020-11-22: qty 5

## 2020-11-22 MED ORDER — SODIUM CHLORIDE 0.9 % IV SOLN
INTRAVENOUS | Status: DC
  Administered 2020-11-22: 250 mL via INTRAVENOUS

## 2020-11-22 MED ORDER — METHYLPREDNISOLONE SODIUM SUCC 40 MG IJ SOLR
40.0000 mg | Freq: Once | INTRAMUSCULAR | Status: AC
  Administered 2020-11-22: 40 mg via INTRAVENOUS
  Filled 2020-11-22: qty 1

## 2020-11-24 ENCOUNTER — Other Ambulatory Visit (HOSPITAL_COMMUNITY): Payer: Self-pay | Admitting: *Deleted

## 2020-11-24 DIAGNOSIS — D696 Thrombocytopenia, unspecified: Secondary | ICD-10-CM

## 2020-11-28 ENCOUNTER — Inpatient Hospital Stay (HOSPITAL_COMMUNITY): Payer: 59 | Attending: Hematology

## 2020-11-28 DIAGNOSIS — D696 Thrombocytopenia, unspecified: Secondary | ICD-10-CM | POA: Insufficient documentation

## 2020-11-28 DIAGNOSIS — K5090 Crohn's disease, unspecified, without complications: Secondary | ICD-10-CM | POA: Insufficient documentation

## 2020-11-28 DIAGNOSIS — R161 Splenomegaly, not elsewhere classified: Secondary | ICD-10-CM | POA: Insufficient documentation

## 2020-11-28 DIAGNOSIS — Z79899 Other long term (current) drug therapy: Secondary | ICD-10-CM | POA: Insufficient documentation

## 2020-11-28 DIAGNOSIS — B2790 Infectious mononucleosis, unspecified without complication: Secondary | ICD-10-CM | POA: Insufficient documentation

## 2020-11-28 DIAGNOSIS — L511 Stevens-Johnson syndrome: Secondary | ICD-10-CM | POA: Insufficient documentation

## 2020-12-03 NOTE — Progress Notes (Signed)
[name] [name], [place] [id number]   CLINIC:  Medical Oncology/Hematology  PCP:  Cassandria Anger, MD [place] / [place] [address]  [phone]  REASON FOR VISIT:  Follow-up for thrombocytopenia and splenomegaly  PRIOR THERAPY: none  CURRENT THERAPY: surveillance  INTERVAL HISTORY:  [name] [name], a 50 y.o. female, returns for routine follow-up for her thrombocytopenia and splenomegaly. [name] [name] was last seen on 11/23/2019.  Today she reports feeling good. She reports easy and heavy bleeding and when scratched anywhere on the body since June; she denies currently taking any blood thinners. She denies nosebleeds, but reports rectal bleeding due to hemorrhoids. She also reports east bruising. Her grandfather also had a history of easy bleeding. She has taken Zoloft since her 61s. She is not currently working. She reports night sweats and denies fevers.   REVIEW OF SYSTEMS:  Review of Systems  Constitutional:  Negative for appetite change, fatigue (50%) and fever.  HENT:   Negative for nosebleeds.   Gastrointestinal:  Positive for blood in stool (hemorrhoids).  Hematological:  Bruises/bleeds easily.  All other systems reviewed and are negative.  PAST MEDICAL/SURGICAL HISTORY:  Past Medical History:  Diagnosis Date   Anxiety    Arthritis    Dr. Eddie Dibbles   Bartholin cyst 2008   vag.   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   Depression    Elevated glucose 2010   GERD (gastroesophageal reflux disease)    Hypertension    Kidney stone    LBP (low back pain)    Dr. Mina Marble   Perianal abscess 2009   Vitamin B12 deficiency    Past Surgical History:  Procedure Laterality Date   BALLOON DILATION N/A 10/15/2019   Procedure: BALLOON DILATION;  Surgeon: Eloise Harman, DO;  [facility];  Service: Endoscopy;   Laterality: N/A;   BIOPSY  10/15/2019   Procedure: BIOPSY;  Surgeon: Eloise Harman, DO;  [facility];  Service: Endoscopy;;   COLONOSCOPY  09/2008   Dr. Derrill Kay: ulcers, edema, possible fistula openings all seen in the distal 3 cm of anus/anal canal. 1cm pseudopolyp. rest of colon and TI normal. rectal biopsy with mild chronic active colitis. ileum bx normal.   COLONOSCOPY WITH PROPOFOL N/A 03/29/2015   SLF: Severe proctocolitis limitied to cecum and rectum. Limited exam of the colon mucosa and anal canal.    COLONOSCOPY WITH PROPOFOL N/A 10/15/2019   Procedure: COLONOSCOPY WITH PROPOFOL;  Surgeon: Eloise Harman, DO;  [facility];  Service: Endoscopy;  Laterality: N/A;  10:45am   ELBOW SURGERY     left   ESOPHAGOGASTRODUODENOSCOPY (EGD) WITH PROPOFOL N/A 03/29/2015   SLF: 1. Erosive gastritis duodentitis.    ESOPHAGOGASTRODUODENOSCOPY (EGD) WITH PROPOFOL N/A 10/15/2019   Procedure: ESOPHAGOGASTRODUODENOSCOPY (EGD) WITH PROPOFOL;  Surgeon: Eloise Harman, DO;  [facility];  Service: Endoscopy;  Laterality: N/A;   RECTOVAGINAL FISTULA CLOSURE     did not help    SOCIAL HISTORY:  Social History   Socioeconomic History   Marital status: Married    Spouse name: Not on file   Number of children: Not on file   Years of education: Not on file   Highest education level: Not on file  Occupational History   Not on file  Tobacco Use   Smoking status:  Every Day    Packs/day: 0.50    Years: 25.00    Pack years: 12.50    Types: Cigarettes    Last attempt to quit: 12/21/2018    Years since quitting: 1.9   Smokeless tobacco: Never   Tobacco comments:    Started back   Vaping Use   Vaping Use: Never used  Substance and Sexual Activity   Alcohol use: No   Drug use: No   Sexual activity: Not Currently    Partners: Male    Birth control/protection: Pill  Other Topics Concern   Not on file  Social History Narrative   Regular Exercise-no   Social  Determinants of Health   Financial Resource Strain: Low Risk    Difficulty of Paying Living Expenses: Not very hard  Food Insecurity: No Food Insecurity   Worried About Charity fundraiser in the Last Year: Never true   Ran Out of Food in the Last Year: Never true  Transportation Needs: No Transportation Needs   Lack of Transportation (Medical): No   Lack of Transportation (Non-Medical): No  Physical Activity: Insufficiently Active   Days of Exercise per Week: 3 days   Minutes of Exercise per Session: 20 min  Stress: No Stress Concern Present   Feeling of Stress : Not at all  Social Connections: Moderately Isolated   Frequency of Communication with Friends and Family: Twice a week   Frequency of Social Gatherings with Friends and Family: Twice a week   Attends Religious Services: More than 4 times per year   Active Member of [employer] or Organizations: No   Attends Music therapist: Never   Marital Status: Separated  Human resources officer Violence: Not At Risk   Fear of Current or Ex-Partner: No   Emotionally Abused: No   Physically Abused: No   Sexually Abused: No    FAMILY HISTORY:  Family History  Problem Relation Age of Onset   Diabetes Mother    Heart disease Mother    Heart attack Mother    Cancer Maternal Aunt        ovarian   Breast cancer Maternal Grandmother    Early death Maternal Grandmother 82   Cancer Maternal Grandmother        breast   Heart attack Father    Heart disease Father    Cancer Maternal Grandfather        colon    CURRENT MEDICATIONS:  Current Outpatient Medications  Medication Sig Dispense Refill   ALPRAZolam (XANAX) 1 MG tablet Take 1 tablet (1 mg total) by mouth 2 (two) times daily. 30 tablet 2   benazepril (LOTENSIN) 20 MG tablet Take 1 tablet (20 mg total) by mouth daily. 30 tablet 6   Cholecalciferol (VITAMIN D3) 250 MCG (10000 UT) capsule Take 30,000 Units by mouth once a week.     cyanocobalamin (,VITAMIN B-12,) 1000 MCG/ML  injection INJECT 1ML INTO THE SKIN EVERY 14 DAYS 6 mL 5   lamoTRIgine (LAMICTAL) 200 MG tablet Take 200 mg by mouth daily.      montelukast (SINGULAIR) 10 MG tablet Take 10 mg by mouth daily.     norethindrone (AYGESTIN) 5 MG tablet Take 1 tablet (5 mg total) by mouth daily. 90 tablet 2   pantoprazole (PROTONIX) 40 MG tablet 1 po qam 90 tablet 3   potassium chloride (KLOR-CON) 10 MEQ tablet Take 1 tablet (10 mEq total) by mouth daily. 90 tablet 3   sertraline (ZOLOFT) 100 MG tablet  Take 200 mg by mouth daily.     SYRINGE-NEEDLE, DISP, 3 ML (B-D SYRINGE/NEEDLE 3CC/25GX5/8) 25G X 5/8" 3 ML MISC Use to administer B12 injection every 14 days 24 each 0   vedolizumab (ENTYVIO) 300 MG injection Inject 300 mg into the vein. Every 12 weeks     BREO ELLIPTA 100-25 MCG/INH AEPB Inhale 1 puff into the lungs as needed. (Patient not taking: Reported on 12/05/2020)     HYDROcodone-acetaminophen (NORCO) 10-325 MG tablet Take 1 tablet by mouth 2 (two) times daily as needed for severe pain. (Patient not taking: Reported on 12/05/2020) 60 tablet 0   HYDROcodone-acetaminophen (NORCO) 10-325 MG tablet Take 1 tablet by mouth 2 (two) times daily as needed for severe pain. (Patient not taking: Reported on 12/05/2020) 60 tablet 0   HYDROcodone-acetaminophen (NORCO) 10-325 MG tablet Take 1 tablet by mouth 2 (two) times daily as needed for severe pain. (Patient not taking: Reported on 12/05/2020) 60 tablet 0   No current facility-administered medications for this visit.    ALLERGIES:  Allergies  Allergen Reactions   Cephalexin Hives and Itching   Codeine Nausea Only   Guaifenesin    Imitrex [Sumatriptan]     2 fingers got very hot   Lopressor [Metoprolol Tartrate] Nausea Only and Other (See Comments)    Made her feel jittery, sweaty / reacted opposite on her.    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  Constitutional:      Appearance: Normal appearance.  Cardiovascular:     Rate and Rhythm: Normal rate and regular rhythm.     Pulses: Normal pulses.     Heart sounds: Normal heart sounds.  Pulmonary:     Effort: Pulmonary effort is normal.     Breath sounds: Normal breath sounds.  Abdominal:     Palpations: Abdomen is soft. There is no hepatomegaly, splenomegaly or mass.     Tenderness: There is no abdominal tenderness.  Neurological:     General: No focal deficit present.     Mental Status: She is alert and oriented to person, place, and time.  Psychiatric:        Mood and Affect: Mood normal.        Behavior: Behavior normal.    LABORATORY DATA:  I have reviewed the labs as listed.  CBC Latest Ref Rng & Units 11/22/2020 08/01/2020 03/31/2020  WBC 4.0 - 10.5 K/uL 7.1 7.4 8.1  Hemoglobin 12.0 - 15.0 g/dL 14.6 14.1 14.3  Hematocrit 36.0 - 46.0 % 42.6 41.1 42.9  Platelets 150 - 400 K/uL 132(L) 133.0 Repeated and verified X2.(L) 143(L)   CMP Latest Ref Rng & Units 11/22/2020 08/01/2020 03/31/2020  Glucose 70 - 99 mg/dL 123(H) 75  121(H)  BUN 6 - 20 mg/dL 19 21 20   Creatinine 0.44 - 1.00 mg/dL 1.48(H) 1.64(H) 1.47(H)  Sodium 135 - 145 mmol/L 137 140 133(L)  Potassium 3.5 - 5.1 mmol/L 3.8 4.0 3.4(L)  Chloride 98 - 111 mmol/L 106 105 102  CO2 22 - 32 mmol/L 24 27 23   Calcium 8.9 - 10.3 mg/dL 8.9 9.6 9.0  Total Protein 6.5 - 8.1 g/dL 7.3 6.9 7.2  Total Bilirubin 0.3 - 1.2  mg/dL 0.9 0.6 0.6  Alkaline Phos 38 - 126 U/L 58 58 59  AST 15 - 41 U/L 16 10 13(L)  ALT 0 - 44 U/L 13 8 11       Component Value Date/Time   RBC 4.49 11/22/2020 0823   MCV 94.9 11/22/2020 0823   MCV 90 01/27/2019 1002   MCH 32.5 11/22/2020 0823   MCHC 34.3 11/22/2020 0823   RDW 13.8 11/22/2020 0823   RDW 13.0 01/27/2019 1002   LYMPHSABS 1.5 11/22/2020 0823   LYMPHSABS 1.5 01/27/2019 1002   MONOABS 0.3 11/22/2020 0823   EOSABS 0.0 11/22/2020 0823   EOSABS 0.1 01/27/2019 1002   BASOSABS 0.0 11/22/2020 0823   BASOSABS 0.0 01/27/2019 1002    DIAGNOSTIC IMAGING:  I have independently reviewed the scans and discussed with the patient. DG Bone Density  Result Date: 11/16/2020 EXAM: DUAL X-RAY ABSORPTIOMETRY (DXA) FOR BONE MINERAL DENSITY IMPRESSION: Your patient [name] [name] completed a BMD test on 11/16/2020 using the [place] (software version: 14.10) manufactured by [employer]. The following summarizes the results of our evaluation. Technologist: [facility] PATIENT BIOGRAPHICAL: Name: [name] [name], [name] [name] Patient ID: [id number] Birth Date: 1971-01-30 Height: 62.0 in. Gender: Female Exam Date: 11/16/2020 Weight: 171.2 lbs. Indications: Caucasian, Chronic Steroid Use, Crohns disease, History of Fracture (Adult), Low Calcium Intake, Perimenopausal, Tobacco User, Vitamin D Deficiency Fractures: Ankle Treatments: Vitamin D DENSITOMETRY RESULTS: Site      Region      Measured Date Measured Age Age Matched Z Score BMD         %Change vs. Previous Significant Change (*) AP Spine L1-L4 (L3) 11/16/2020 [age] 1.187 g/cm2 -6.2% Yes AP Spine L1-L4 (L3) 07[birth year]19/2015 44.6 [age] 1.266 g/cm2 - - DualFemur Total Right 11/16/2020 50.9 0.2 0.985 g/cm2 0.8% - DualFemur Total Right 07[age]19/2015 45.6 [age] 0.977 g/cm2 - - DualFemur Total Mean 11/16/2020 50.9 0.4 1.002 g/cm2 -0.8% - DualFemur Total Mean 07/19/2015 45.6 [age] 1.010 g/cm2 - - ASSESSMENT: The BMD measured at Femur Total Right is 0.985 g/cm2  with a Z-score of 0.3. This patient is considered normal according to [facility]) criteria. The scan quality is good. Compared with the prior study on 07/19/2015, the BMD of the total mean shows no statistically significant change,however, the ap spine shows a significant decrease. L3 was excluded due to advanced degenerative changes. [facility] Pharmacologist [facility]) criteria for post-menopausal, Caucasian Women: Normal:       T-score at or above -1 SD Osteopenia:   T-score between -1 and -2.5 SD Osteoporosis: T-score at or below -2.5 SD RECOMMENDATIONS: All patients should optimize calcium and vitamin D intake. FOLLOW-UP: Patients with diagnosis of osteoporosis or at high risk for fracture should have regular bone mineral density tests. For patients eligible for Medicare, routine testing is allowed once every 2 years. The testing frequency can be increased to one year for patients who have rapidly progressing disease, those who are receiving or discontinuing medical therapy to restore bone mass, or have additional risk  factors. I have reviewed this report, and agree with the above findings. Mark A. Thornton Papas, M.D. [facility] Electronically Signed   By: Lavonia Dana M.D.   On: 11/16/2020 09:33     ASSESSMENT:  Thrombocytopenia: - Previous work-up including hepatitis serology, ANA, rheumatoid factor, SPEP were negative.  Nutritional deficiency work-up was also negative. - Etiology was thought to be secondary to splenomegaly.  Splenomegaly: - She reports that she had splenomegaly since she was diagnosed with mononucleosis in her mid 72s. - Spleen is palpable about 2 to 3 fingerbreadths below the left costal margin.  3.  Crohn's disease: - Crohn's disease since her mid 63s.  Developed Stevens-Johnson syndrome with Remicade. - She is on Entyvio at this time.  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  PLAN:  Thrombocytopenia: - Her platelet count from  11/22/2020 was 132. - She reports easy bleeding when she gets scratched and also easy bruising for the last few months, especially since June.  No other type of bleeding was reported.  No previous history of bleeding after surgery or dental extractions.  Physical examination today did not reveal any major bruising. - No family history of bleeding disorders.  She has been on Zoloft for many years. - Will check for PT, PTT and fibrinogen levels.  We will also check for von Willebrand's factor. - She does not take any over-the-counter medications.  She is not on any blood thinners or antiplatelet agents. - If no abnormalities, will consider PFA-100 testing.  RTC 2 weeks for follow-up.  Splenomegaly: - This has been stable by physical exam.  She does not have any B symptoms.  Orders placed this encounter:  No orders of the defined types were placed in this encounter.    Derek Jack, MD [place] [phone]   I, Thana Ates, am acting as a scribe for Dr. Derek Jack.  I, Derek Jack MD, have reviewed the above documentation for accuracy and completeness, and I agree with the above.

## 2020-12-05 ENCOUNTER — Inpatient Hospital Stay (HOSPITAL_COMMUNITY): Payer: 59

## 2020-12-05 ENCOUNTER — Other Ambulatory Visit: Payer: Self-pay

## 2020-12-05 ENCOUNTER — Inpatient Hospital Stay (HOSPITAL_BASED_OUTPATIENT_CLINIC_OR_DEPARTMENT_OTHER): Payer: 59 | Admitting: Hematology

## 2020-12-05 VITALS — BP 107/72 | HR 95 | Temp 97.4°F | Resp 18 | Wt 170.9 lb

## 2020-12-05 DIAGNOSIS — L511 Stevens-Johnson syndrome: Secondary | ICD-10-CM | POA: Diagnosis not present

## 2020-12-05 DIAGNOSIS — B2790 Infectious mononucleosis, unspecified without complication: Secondary | ICD-10-CM | POA: Diagnosis not present

## 2020-12-05 DIAGNOSIS — D696 Thrombocytopenia, unspecified: Secondary | ICD-10-CM

## 2020-12-05 DIAGNOSIS — K5090 Crohn's disease, unspecified, without complications: Secondary | ICD-10-CM | POA: Diagnosis not present

## 2020-12-05 DIAGNOSIS — Z79899 Other long term (current) drug therapy: Secondary | ICD-10-CM | POA: Diagnosis not present

## 2020-12-05 DIAGNOSIS — R161 Splenomegaly, not elsewhere classified: Secondary | ICD-10-CM | POA: Diagnosis not present

## 2020-12-05 LAB — PROTIME-INR
INR: 1.1 (ref 0.8–1.2)
Prothrombin Time: 14 seconds (ref 11.4–15.2)

## 2020-12-05 LAB — CBC WITH DIFFERENTIAL/PLATELET
Abs Immature Granulocytes: 0.02 10*3/uL (ref 0.00–0.07)
Basophils Absolute: 0 10*3/uL (ref 0.0–0.1)
Basophils Relative: 0 %
Eosinophils Absolute: 0.1 10*3/uL (ref 0.0–0.5)
Eosinophils Relative: 1 %
HCT: 40.4 % (ref 36.0–46.0)
Hemoglobin: 13.9 g/dL (ref 12.0–15.0)
Immature Granulocytes: 0 %
Lymphocytes Relative: 20 %
Lymphs Abs: 1.5 10*3/uL (ref 0.7–4.0)
MCH: 32.6 pg (ref 26.0–34.0)
MCHC: 34.4 g/dL (ref 30.0–36.0)
MCV: 94.6 fL (ref 80.0–100.0)
Monocytes Absolute: 0.3 10*3/uL (ref 0.1–1.0)
Monocytes Relative: 4 %
Neutro Abs: 5.8 10*3/uL (ref 1.7–7.7)
Neutrophils Relative %: 75 %
Platelets: 121 10*3/uL — ABNORMAL LOW (ref 150–400)
RBC: 4.27 MIL/uL (ref 3.87–5.11)
RDW: 13.5 % (ref 11.5–15.5)
WBC: 7.7 10*3/uL (ref 4.0–10.5)
nRBC: 0 % (ref 0.0–0.2)

## 2020-12-05 LAB — COMPREHENSIVE METABOLIC PANEL
ALT: 13 U/L (ref 0–44)
AST: 14 U/L — ABNORMAL LOW (ref 15–41)
Albumin: 4.4 g/dL (ref 3.5–5.0)
Alkaline Phosphatase: 57 U/L (ref 38–126)
Anion gap: 4 — ABNORMAL LOW (ref 5–15)
BUN: 19 mg/dL (ref 6–20)
CO2: 27 mmol/L (ref 22–32)
Calcium: 9.2 mg/dL (ref 8.9–10.3)
Chloride: 107 mmol/L (ref 98–111)
Creatinine, Ser: 1.63 mg/dL — ABNORMAL HIGH (ref 0.44–1.00)
GFR, Estimated: 38 mL/min — ABNORMAL LOW (ref >60)
Glucose, Bld: 90 mg/dL (ref 70–99)
Potassium: 4.3 mmol/L (ref 3.5–5.1)
Sodium: 138 mmol/L (ref 135–145)
Total Bilirubin: 0.8 mg/dL (ref 0.3–1.2)
Total Protein: 7 g/dL (ref 6.5–8.1)

## 2020-12-05 LAB — FIBRINOGEN: Fibrinogen: 422 mg/dL (ref 210–475)

## 2020-12-05 LAB — APTT: aPTT: 29 seconds (ref 24–36)

## 2020-12-05 NOTE — Patient Instructions (Signed)
[place] at [facility] Discharge Instructions  You were seen and examined today by Dr. Delton Coombes.  We will check additional lab work to rule out any other blood disorders due to your easy bruising and bleeding.   Return as scheduled for lab work and office visit.    Thank you for choosing [place] at [facility] to provide your oncology and hematology care.  To afford each patient quality time with our provider, please arrive at least 15 minutes before your scheduled appointment time.   If you have a lab appointment with the [facility] please come in thru the Main Entrance and check in at the main information desk.  You need to re-schedule your appointment should you arrive 10 or more minutes late.  We strive to give you quality time with our providers, and arriving late affects you and other patients whose appointments are after yours.  Also, if you no show three or more times for appointments you may be dismissed from the clinic at the providers discretion.     Again, thank you for choosing [facility].  Our hope is that these requests will decrease the amount of time that you wait before being seen by our physicians.       _____________________________________________________________  Should you have questions after your visit to [facility], please contact our office at [phone] and follow the prompts.  Our office hours are 8:00 a.m. and 4:30 p.m. Monday - Friday.  Please note that voicemails left after 4:00 p.m. may not be returned until the following business day.  We are closed weekends and major holidays.  You do have access to a nurse 24-7, just call the main number to the clinic [phone] and do not press any options, hold on the line and a nurse will answer the phone.    For prescription refill requests, have your pharmacy contact our office and allow 72 hours.    Due to Covid, you will need to  wear a mask upon entering the hospital. If you do not have a mask, a mask will be given to you at the Main Entrance upon arrival. For doctor visits, patients may have 1 support person age 53 or older with them. For treatment visits, patients can not have anyone with them due to social distancing guidelines and our immunocompromised population.

## 2020-12-07 LAB — VON WILLEBRAND PANEL
Coagulation Factor VIII: 86 % (ref 56–140)
Ristocetin Co-factor, Plasma: 60 % (ref 50–200)
Von Willebrand Antigen, Plasma: 77 % (ref 50–200)

## 2020-12-07 LAB — COAG STUDIES INTERP REPORT

## 2020-12-08 LAB — FACTOR 13 ACTIVITY: Factor XIII, Qualitative: NORMAL

## 2020-12-10 NOTE — Progress Notes (Signed)
[facility] [address], [place] [address]   CLINIC:  Medical Oncology/Hematology  PCP:  Cassandria Anger, MD [address] / [place] [address]  [phone]  REASON FOR VISIT:  Follow-up for thrombocytopenia  PRIOR THERAPY: none  CURRENT THERAPY: surveillance  INTERVAL HISTORY:  [name], a 50 y.o. female, returns for routine follow-up for her thrombocytopenia. [name] [name] was last seen on 12/05/2020.  Today she reports feeling good. She denies any bleeding/bruising over the past week, but she also reports she has not received any injuries to provoke easy bleeding or bruising.   REVIEW OF SYSTEMS:  Review of Systems  Constitutional:  Negative for appetite change and fatigue (75%).  All other systems reviewed and are negative.  PAST MEDICAL/SURGICAL HISTORY:  Past Medical History:  Diagnosis Date   Anxiety    Arthritis    Dr. Eddie Dibbles   Bartholin cyst 2008   vag.   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   Depression    Elevated glucose 2010   GERD (gastroesophageal reflux disease)    Hypertension    Kidney stone    LBP (low back pain)    Dr. Mina Marble   Perianal abscess 2009   Vitamin B12 deficiency    Past Surgical History:  Procedure Laterality Date   BALLOON DILATION N/A 10/15/2019   Procedure: BALLOON DILATION;  Surgeon: Eloise Harman, DO;  [facility];  Service: Endoscopy;  Laterality: N/A;   BIOPSY  10/15/2019   Procedure: BIOPSY;  Surgeon: Eloise Harman, DO;  [facility];  Service: Endoscopy;;   COLONOSCOPY  09/2008   Dr. Derrill Kay: ulcers, edema, possible fistula openings all seen in the distal 3 cm of anus/anal canal. 1cm pseudopolyp. rest of colon and TI normal. rectal biopsy with mild chronic active colitis. ileum bx normal.   COLONOSCOPY WITH PROPOFOL N/A 03/29/2015   SLF: Severe  proctocolitis limitied to cecum and rectum. Limited exam of the colon mucosa and anal canal.    COLONOSCOPY WITH PROPOFOL N/A 10/15/2019   Procedure: COLONOSCOPY WITH PROPOFOL;  Surgeon: Eloise Harman, DO;  [facility];  Service: Endoscopy;  Laterality: N/A;  10:45am   ELBOW SURGERY     left   ESOPHAGOGASTRODUODENOSCOPY (EGD) WITH PROPOFOL N/A 03/29/2015   SLF: 1. Erosive gastritis duodentitis.    ESOPHAGOGASTRODUODENOSCOPY (EGD) WITH PROPOFOL N/A 10/15/2019   Procedure: ESOPHAGOGASTRODUODENOSCOPY (EGD) WITH PROPOFOL;  Surgeon: Eloise Harman, DO;  [facility];  Service: Endoscopy;  Laterality: N/A;   RECTOVAGINAL FISTULA CLOSURE     did not help    SOCIAL HISTORY:  Social History   Socioeconomic History   Marital status: Married    Spouse name: Not on file   Number of children: Not on file   Years of education: Not on file   Highest education level: Not on file  Occupational History   Not on file  Tobacco Use   Smoking status: Every Day    Packs/day: 0.50    Years: 25.00    Pack years: 12.50    Types: Cigarettes    Last attempt to quit: 12/21/2018    Years since quitting: 1.9   Smokeless tobacco: Never   Tobacco comments:    Started back   Vaping Use   Vaping Use: Never used  Substance and Sexual Activity  Alcohol use: No   Drug use: No   Sexual activity: Not Currently    Partners: Male    Birth control/protection: Pill  Other Topics Concern   Not on file  Social History Narrative   Regular Exercise-no   Social Determinants of Health   Financial Resource Strain: Low Risk    Difficulty of Paying Living Expenses: Not very hard  Food Insecurity: No Food Insecurity   Worried About Charity fundraiser in the Last Year: Never true   Ran Out of Food in the Last Year: Never true  Transportation Needs: No Transportation Needs   Lack of Transportation (Medical): No   Lack of Transportation (Non-Medical): No  Physical Activity: Insufficiently  Active   Days of Exercise per Week: 3 days   Minutes of Exercise per Session: 20 min  Stress: No Stress Concern Present   Feeling of Stress : Not at all  Social Connections: Moderately Isolated   Frequency of Communication with Friends and Family: Twice a week   Frequency of Social Gatherings with Friends and Family: Twice a week   Attends Religious Services: More than 4 times per year   Active Member of [employer] or Organizations: No   Attends Music therapist: Never   Marital Status: Separated  Human resources officer Violence: Not At Risk   Fear of Current or Ex-Partner: No   Emotionally Abused: No   Physically Abused: No   Sexually Abused: No    FAMILY HISTORY:  Family History  Problem Relation Age of Onset   Diabetes Mother    Heart disease Mother    Heart attack Mother    Cancer Maternal Aunt        ovarian   Breast cancer Maternal Grandmother    Early death Maternal Grandmother 42   Cancer Maternal Grandmother        breast   Heart attack Father    Heart disease Father    Cancer Maternal Grandfather        colon    CURRENT MEDICATIONS:  Current Outpatient Medications  Medication Sig Dispense Refill   ALPRAZolam (XANAX) 1 MG tablet Take 1 tablet (1 mg total) by mouth 2 (two) times daily. 30 tablet 2   benazepril (LOTENSIN) 20 MG tablet Take 1 tablet (20 mg total) by mouth daily. 30 tablet 6   BREO ELLIPTA 100-25 MCG/INH AEPB Inhale 1 puff into the lungs as needed. (Patient not taking: Reported on 12/05/2020)     Cholecalciferol (VITAMIN D3) 250 MCG (10000 UT) capsule Take 30,000 Units by mouth once a week.     cyanocobalamin (,VITAMIN B-12,) 1000 MCG/ML injection INJECT 1ML INTO THE SKIN EVERY 14 DAYS 6 mL 5   HYDROcodone-acetaminophen (NORCO) 10-325 MG tablet Take 1 tablet by mouth 2 (two) times daily as needed for severe pain. (Patient not taking: Reported on 12/05/2020) 60 tablet 0   HYDROcodone-acetaminophen (NORCO) 10-325 MG tablet Take 1 tablet by mouth 2  (two) times daily as needed for severe pain. (Patient not taking: Reported on 12/05/2020) 60 tablet 0   HYDROcodone-acetaminophen (NORCO) 10-325 MG tablet Take 1 tablet by mouth 2 (two) times daily as needed for severe pain. (Patient not taking: Reported on 12/05/2020) 60 tablet 0   lamoTRIgine (LAMICTAL) 200 MG tablet Take 200 mg by mouth daily.      montelukast (SINGULAIR) 10 MG tablet Take 10 mg by mouth daily.     norethindrone (AYGESTIN) 5 MG tablet Take 1 tablet (5 mg total) by mouth  daily. 90 tablet 2   pantoprazole (PROTONIX) 40 MG tablet 1 po qam 90 tablet 3   potassium chloride (KLOR-CON) 10 MEQ tablet Take 1 tablet (10 mEq total) by mouth daily. 90 tablet 3   sertraline (ZOLOFT) 100 MG tablet Take 200 mg by mouth daily.     SYRINGE-NEEDLE, DISP, 3 ML (B-D SYRINGE/NEEDLE 3CC/25GX5/8) 25G X 5/8" 3 ML MISC Use to administer B12 injection every 14 days 24 each 0   vedolizumab (ENTYVIO) 300 MG injection Inject 300 mg into the vein. Every 12 weeks     No current facility-administered medications for this visit.    ALLERGIES:  Allergies  Allergen Reactions   Cephalexin Hives and Itching   Codeine Nausea Only   Guaifenesin    Imitrex [Sumatriptan]     2 fingers got very hot   Lopressor [Metoprolol Tartrate] Nausea Only and Other (See Comments)    Made her feel jittery, sweaty / reacted opposite on her.    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 Wt Readings from Last 3 Encounters:  12/05/20 170 lb 13.7 oz (77.5 kg)  11/22/20 171 lb 3.2 oz (77.7 kg)  11/10/20 171 lb 3.2 oz (77.7 kg)    Physical Exam Vitals reviewed.  Constitutional:      Appearance: Normal appearance.  Cardiovascular:     Rate and Rhythm: Normal rate and regular rhythm.     Pulses: Normal pulses.     Heart sounds: Normal heart sounds.  Pulmonary:     Effort: Pulmonary effort is normal.     Breath sounds: Normal breath sounds.  Neurological:     General: No focal deficit present.     Mental Status: She is alert and oriented to person, place, and time.  Psychiatric:        Mood and Affect: Mood normal.        Behavior: Behavior normal.    LABORATORY DATA:  I have reviewed the labs as listed.  CBC Latest Ref Rng & Units 12/05/2020 11/22/2020 08/01/2020  WBC 4.0 - 10.5 K/uL 7.7 7.1 7.4  Hemoglobin 12.0 - 15.0 g/dL 13.9 14.6 14.1  Hematocrit 36.0 - 46.0 % 40.4 42.6 41.1  Platelets 150 - 400 K/uL 121(L) 132(L) 133.0 Repeated and verified X2.(L)   CMP Latest Ref Rng & Units 12/05/2020 11/22/2020 08/01/2020  Glucose 70 - 99 mg/dL 90 123(H) 75  BUN 6 - 20 mg/dL 19 19 21   Creatinine 0.44 - 1.00 mg/dL 1.63(H) 1.48(H) 1.64(H)  Sodium 135 - 145 mmol/L 138 137 140  Potassium 3.5 - 5.1 mmol/L 4.3 3.8 4.0  Chloride 98 - 111 mmol/L 107 106 105  CO2 22 - 32 mmol/L 27 24 27   Calcium 8.9 - 10.3 mg/dL 9.2 8.9 9.6  Total Protein 6.5 - 8.1 g/dL 7.0 7.3 6.9  Total Bilirubin 0.3 - 1.2 mg/dL 0.8 0.9 0.6  Alkaline Phos 38 - 126 U/L 57 58 58  AST 15 - 41 U/L 14(L) 16 10  ALT 0 - 44 U/L 13 13 8  Component Value Date/Time   RBC 4.27 12/05/2020 0910   MCV 94.6 12/05/2020 0910   MCV 90 01/27/2019 1002   MCH 32.6 12/05/2020 0910   MCHC 34.4 12/05/2020 0910   RDW 13.5 12/05/2020 0910   RDW 13.0 01/27/2019 1002   LYMPHSABS 1.5 12/05/2020 0910   LYMPHSABS 1.5 01/27/2019 1002   MONOABS 0.3 12/05/2020 0910   EOSABS 0.1 12/05/2020 0910   EOSABS 0.1 01/27/2019 1002   BASOSABS 0.0 12/05/2020 0910   BASOSABS 0.0 01/27/2019 1002    DIAGNOSTIC IMAGING:  I have independently reviewed the scans and discussed  with the patient. DG Bone Density  Result Date: 11/16/2020 EXAM: DUAL X-RAY ABSORPTIOMETRY (DXA) FOR BONE MINERAL DENSITY IMPRESSION: Your patient Tonyetta Berko completed a BMD test on 11/16/2020 using the [place] (software version: 14.10) manufactured by [employer]. The following summarizes the results of our evaluation. Technologist: [facility] PATIENT BIOGRAPHICAL: Name: Analayah, Brooke Patient ID: [id number] Birth Date: [birth year] Height: 62.0 in. Gender: Female Exam Date: 11/16/2020 Weight: 171.2 lbs. Indications: Caucasian, Chronic Steroid Use, Crohns disease, History of Fracture (Adult), Low Calcium Intake, Perimenopausal, Tobacco User, Vitamin D Deficiency Fractures: Ankle Treatments: Vitamin D DENSITOMETRY RESULTS: Site      Region      Measured Date Measured Age Age Matched Z Score BMD         %Change vs. Previous Significant Change (*) AP Spine L1-L4 (L3) 11/16/2020 50.9 [age] 1.187 g/cm2 -6.2% Yes AP Spine L1-L4 (L3) 07[birth year]19/2015 44.6 [age] 1.266 g/cm2 - - DualFemur Total Right 11/16/2020 50.9 0.2 0.985 g/cm2 0.8% - DualFemur Total Right 07/19/2015 45.6 [age] 0.977 g/cm2 - - DualFemur Total Mean 11/16/2020 50.9 [age] 1.002 g/cm2 -0.8% - DualFemur Total Mean 07/19/2015 45.6 [age] 1.010 g/cm2 - - ASSESSMENT: The BMD measured at Femur Total Right is 0.985 g/cm2 with a Z-score of 0.3. This patient is considered normal according to [facility]) criteria. The scan quality is good. Compared with the prior study on 07/19/2015, the BMD of the total mean shows no statistically significant change,however, the ap spine shows a significant decrease. L3 was excluded due to advanced degenerative changes. World Pharmacologist [facility]) criteria for post-menopausal, Caucasian Women: Normal:       T-score at or above -1 SD Osteopenia:   T-score between -1 and -2.5 SD Osteoporosis: T-score at or below -2.5 SD RECOMMENDATIONS: All patients should optimize calcium and vitamin D intake.  FOLLOW-UP: Patients with diagnosis of osteoporosis or at high risk for fracture should have regular bone mineral density tests. For patients eligible for Medicare, routine testing is allowed once every 2 years. The testing frequency can be increased to one year for patients who have rapidly progressing disease, those who are receiving or discontinuing medical therapy to restore bone mass, or have additional risk factors. I have reviewed this report, and agree with the above findings. Mark A. Thornton Papas, M.D. [facility] Electronically Signed   By: Lavonia Dana M.D.   On: 11/16/2020 09:33     ASSESSMENT:  Thrombocytopenia: - Previous work-up including hepatitis serology, ANA, rheumatoid factor, SPEP were negative.  Nutritional deficiency work-up was also negative. - Etiology was thought to be secondary to splenomegaly.   Splenomegaly: - She reports that she had splenomegaly since she was diagnosed with mononucleosis in her mid 27s. - Spleen is palpable about 2 to 3 fingerbreadths below the left costal margin.  3.  Crohn's disease: - Crohn's disease since her mid 109s.  Developed Stevens-Johnson  syndrome with Remicade. - She is on Entyvio at this time.  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   PLAN:  Thrombocytopenia: - She reported easy bleeding when she gets scratched and easy bruising over the last few months, especially since June.  No previous history of bleeding after surgical procedures. - She has mild thrombocytopenia with platelet count 121 and stable. - PT, PTT and fibrinogen were normal. - Von Willebrand panel was normal.  Factor XIII was normal. - She reports that bruising has been better for the last 1 week. - If it is worsened, will consider PFA-100 and other coagulation factor testing. - She has been on Zoloft for several years.  Rarely antidepressants can also cause easy bruising. - RTC 6 months for follow-up.   Splenomegaly: - This has been  stable.  She does not have any B symptoms.  Orders placed this encounter:  No orders of the defined types were placed in this encounter.    Derek Jack, MD [place] [phone]   I, Thana Ates, am acting as a scribe for Dr. Derek Jack.  I, Derek Jack MD, have reviewed the above documentation for accuracy and completeness, and I agree with the above.

## 2020-12-12 ENCOUNTER — Inpatient Hospital Stay (HOSPITAL_BASED_OUTPATIENT_CLINIC_OR_DEPARTMENT_OTHER): Payer: 59 | Admitting: Hematology

## 2020-12-12 ENCOUNTER — Other Ambulatory Visit: Payer: Self-pay

## 2020-12-12 VITALS — BP 104/81 | HR 104 | Temp 98.4°F | Resp 18 | Wt 168.5 lb

## 2020-12-12 DIAGNOSIS — D696 Thrombocytopenia, unspecified: Secondary | ICD-10-CM

## 2020-12-12 NOTE — Patient Instructions (Signed)
[facility] Discharge Instructions   You were seen and examined today by Dr. Delton Coombes.  The special blood tests we did to check your easy bruising/bleeding was all normal.   Return as scheduled in 6 months with lab work and office visit.    Thank you for choosing [facility] to provide your oncology and hematology care.  To afford each patient quality time with our provider, please arrive at least 15 minutes before your scheduled appointment time.   If you have a lab appointment with the [facility] please come in thru the Main Entrance and check in at the main information desk.  You need to re-schedule your appointment should you arrive 10 or more minutes late.  We strive to give you quality time with our providers, and arriving late affects you and other patients whose appointments are after yours.  Also, if you no show three or more times for appointments you may be dismissed from the clinic at the providers discretion.     Again, thank you for choosing [facility].  Our hope is that these requests will decrease the amount of time that you wait before being seen by our physicians.       _____________________________________________________________  Should you have questions after your visit to [facility], please contact our office at [phone] and follow the prompts.  Our office hours are 8:00 a.m. and 4:30 p.m. Monday - Friday.  Please note that voicemails left after 4:00 p.m. may not be returned until the following business day.  We are closed weekends and major holidays.  You do have access to a nurse 24-7, just call the main number to the clinic [phone] and do not press any options, hold on the line and a nurse will answer the phone.    For prescription refill requests, have your pharmacy contact our office and allow 72 hours.    Due to Covid, you will need to wear a mask  upon entering the hospital. If you do not have a mask, a mask will be given to you at the Main Entrance upon arrival. For doctor visits, patients may have 1 support person age 62 or older with them. For treatment visits, patients can not have anyone with them due to social distancing guidelines and our immunocompromised population.

## 2020-12-14 ENCOUNTER — Other Ambulatory Visit: Payer: Self-pay | Admitting: Nephrology

## 2020-12-14 DIAGNOSIS — I10 Essential (primary) hypertension: Secondary | ICD-10-CM

## 2020-12-14 DIAGNOSIS — N1832 Chronic kidney disease, stage 3b: Secondary | ICD-10-CM

## 2020-12-20 ENCOUNTER — Ambulatory Visit
Admission: RE | Admit: 2020-12-20 | Discharge: 2020-12-20 | Disposition: A | Discharge status: Home / Self Care | Payer: 59 | Source: Ambulatory Visit | Attending: Nephrology | Admitting: Nephrology

## 2020-12-20 DIAGNOSIS — I10 Essential (primary) hypertension: Secondary | ICD-10-CM

## 2020-12-20 DIAGNOSIS — N1832 Chronic kidney disease, stage 3b: Secondary | ICD-10-CM

## 2020-12-22 ENCOUNTER — Other Ambulatory Visit: Payer: Self-pay | Admitting: Family Medicine

## 2021-01-05 ENCOUNTER — Telehealth: Payer: Self-pay | Admitting: Internal Medicine

## 2021-01-05 NOTE — Telephone Encounter (Signed)
Spoke to [name] [name]. Wants to know if we will prescribe Toradol 10 mg #20.  She said it would help with inflammation when her hemorrhoids flare up and has fistula.  Dr. Alain Marion wrote last RX but he took her off due to being worried about kidney function.  She only used #16 for a whole year.  Went to [facility] and said that she was cleared.  They did scan on 12/21/2020.  [name] [name] says everything looked good. [name] [name] would like [place] to respond by my chart.  [name] [name] says unable to access Dr. Abbey Chatters when she tries to pull him up on my chart.  Uses [facility] [place], [place]

## 2021-01-05 NOTE — Telephone Encounter (Signed)
PLEASE CALL PATIENT SHE WOULD LIKE FOR DR CARVER TO SEND HER IN A PRESCRIPTION OF TORADOL 10MG 20 TABLETS, SHE STATED SHE HAD SPOKEN TO DR CARVER BEFORE ABOUT THIS.  SHE TAKES THEM FOR INFLAMMATION WHEN HER HEMORRHOIDS FLAIR UP.

## 2021-01-06 ENCOUNTER — Telehealth: Payer: Self-pay | Admitting: Internal Medicine

## 2021-01-06 NOTE — Telephone Encounter (Signed)
Called again to check if anything was decided about the medications

## 2021-01-09 ENCOUNTER — Telehealth: Payer: Self-pay | Admitting: Internal Medicine

## 2021-01-09 MED ORDER — KETOROLAC TROMETHAMINE 10 MG PO TABS: 10.0000 mg | ORAL_TABLET | Freq: Three times a day (TID) | ORAL | 0 refills | Status: DC | PRN

## 2021-01-09 NOTE — Telephone Encounter (Signed)
I called and spoke with patient.  She states that 20 tablets of Toradol have lasted her a year.  She is requesting refill today.  She does have chronic kidney disease but states that she spoke to her nephrologist and given the very sparing use of Toradol that this was okay to take.  I will send in refill today.  Follow-up with GI as per schedule.

## 2021-01-09 NOTE — Telephone Encounter (Signed)
Spoke to pt and made her aware that RX has been sent.

## 2021-01-09 NOTE — Telephone Encounter (Signed)
Pt called to follow up.  Routing to Dr. Abbey Chatters as Juluis Rainier.

## 2021-01-18 ENCOUNTER — Telehealth: Payer: Self-pay

## 2021-01-18 NOTE — Telephone Encounter (Signed)
Phoned [name] Griffins RN @ [facility] --direct number [phone] and LMOVM for her to call me regarding getting the location name on the pt's PA for Entyvio changed to Outpatient as the location where the pt will receive her injections at so they can receive payment for their services. Right now it is listed as [facility] and the exact same paperwork was used on our end.

## 2021-01-18 NOTE — Telephone Encounter (Signed)
Phoned to [place] on behalf of [place] phoned [facility] regarding the place of service listed on the pt's PA authorization documentation. Was on the phone 28 min and 29 sec and no help. The exact same information from last year and when both authorizations from 2 different years came back it had the place of service listed differently. Nothing different was done. Phoned to [facility] spoke with April in the finance dept because she was the one who alerted me to this issue. I advised her that I have been trying to get this issue fixed because the pt's next injection is 12/27th. April had to appeal the pt's injection before this one to get approval to be paid. I advised her we are limited in what we can do here. I don't do paperwork all day that I cover a lot of different things to do throughout the day. Advised her that I will start back with [employer]) after lunch. She advises I keep her in the know of what's happening.

## 2021-01-19 NOTE — Telephone Encounter (Signed)
Phoned Caren Griffins RN and advised her that I had received a call from April @ [facility] in the finance dept regarding place of service on authorization needs to be switched from Office to [facility]. She stated she seen what I was talking about and that she will contact that particular dept and see if they can change it without having to do another PA on it. She states it may not be today she gets something settled with this. Waiting for a call back.

## 2021-01-25 NOTE — Telephone Encounter (Signed)
I spoke with Caren Griffins (RN of [employer]) and was advised from their end they have sent the necessary paperwork to try and get the names switched on the facility where the pt gets her injections. This was not on our end the same paperwork was done as before but [facility] when they sent in their paperwork it was on paperwork titled [place]. April from finance dept advised if this can't be corrected the pt may have to pay out of pocket or miss Dec. Entyvio injection. This matter is being worked on and I had advised April (from [employer]) this error was on their end. Will keep you posted.

## 2021-01-30 ENCOUNTER — Encounter: Payer: Self-pay | Admitting: Internal Medicine

## 2021-01-30 ENCOUNTER — Other Ambulatory Visit: Payer: Self-pay

## 2021-01-30 ENCOUNTER — Ambulatory Visit (INDEPENDENT_AMBULATORY_CARE_PROVIDER_SITE_OTHER): Payer: 59 | Admitting: Internal Medicine

## 2021-01-30 DIAGNOSIS — M5440 Lumbago with sciatica, unspecified side: Secondary | ICD-10-CM

## 2021-01-30 DIAGNOSIS — E538 Deficiency of other specified B group vitamins: Secondary | ICD-10-CM

## 2021-01-30 DIAGNOSIS — K50013 Crohn's disease of small intestine with fistula: Secondary | ICD-10-CM | POA: Diagnosis not present

## 2021-01-30 DIAGNOSIS — G8929 Other chronic pain: Secondary | ICD-10-CM

## 2021-01-30 DIAGNOSIS — N183 Chronic kidney disease, stage 3 unspecified: Secondary | ICD-10-CM | POA: Diagnosis not present

## 2021-01-30 MED ORDER — HYDROCODONE-ACETAMINOPHEN 10-325 MG PO TABS: 1.0000 | ORAL_TABLET | Freq: Two times a day (BID) | ORAL | 0 refills | Status: DC | PRN

## 2021-01-30 MED ORDER — CYANOCOBALAMIN 1000 MCG/ML IJ SOLN: INTRAMUSCULAR | 5 refills | Status: DC

## 2021-01-30 NOTE — Assessment & Plan Note (Signed)
Monitoring GFR Hydrate well

## 2021-01-30 NOTE — Assessment & Plan Note (Signed)
Norco prn  Potential benefits of a long term opioids use as well as potential risks (i.e. addiction risk, apnea etc) and complications (i.e. Somnolence, constipation and others) were explained to the patient and were aknowledged. 

## 2021-01-30 NOTE — Assessment & Plan Note (Signed)
Stable. On IV Entyvio q 12 wks

## 2021-01-30 NOTE — Assessment & Plan Note (Signed)
Chronic - continue on B12 inj q 2 wks

## 2021-01-30 NOTE — Progress Notes (Signed)
Subjective:  Patient ID: [name] [name], female    DOB: 1970-07-17  Age: 50 y.o. MRN: [id number]  CC: Follow-up (3 month f/u)   HPI [name] [name] presents for LBP, colitis, B12 def f/u  Outpatient Medications Prior to Visit  Medication Sig Dispense Refill   ALPRAZolam (XANAX) 1 MG tablet Take 1 tablet (1 mg total) by mouth 2 (two) times daily. 30 tablet 2   benazepril (LOTENSIN) 20 MG tablet TAKE 1 TABLET BY MOUTH EVERY DAY 90 tablet 2   BREO ELLIPTA 100-25 MCG/INH AEPB Inhale 1 puff into the lungs as needed.     Cholecalciferol (VITAMIN D3) 250 MCG (10000 UT) capsule Take 30,000 Units by mouth once a week.     ketorolac (TORADOL) 10 MG tablet Take 1 tablet (10 mg total) by mouth every 8 (eight) hours as needed for severe pain. 30 tablet 0   lamoTRIgine (LAMICTAL) 200 MG tablet Take 200 mg by mouth daily.      montelukast (SINGULAIR) 10 MG tablet Take 10 mg by mouth daily.     norethindrone (AYGESTIN) 5 MG tablet Take 1 tablet (5 mg total) by mouth daily. 90 tablet 2   pantoprazole (PROTONIX) 40 MG tablet 1 po qam 90 tablet 3   potassium chloride (KLOR-CON) 10 MEQ tablet Take 1 tablet (10 mEq total) by mouth daily. 90 tablet 3   sertraline (ZOLOFT) 100 MG tablet Take 200 mg by mouth daily.     SYRINGE-NEEDLE, DISP, 3 ML (B-D SYRINGE/NEEDLE 3CC/25GX5/8) 25G X 5/8" 3 ML MISC Use to administer B12 injection every 14 days 24 each 0   vedolizumab (ENTYVIO) 300 MG injection Inject 300 mg into the vein. Every 12 weeks     cyanocobalamin (,VITAMIN B-12,) 1000 MCG/ML injection INJECT 1ML INTO THE SKIN EVERY 14 DAYS 6 mL 5   HYDROcodone-acetaminophen (NORCO) 10-325 MG tablet Take 1 tablet by mouth 2 (two) times daily as needed for severe pain. 60 tablet 0   HYDROcodone-acetaminophen (NORCO) 10-325 MG tablet Take 1 tablet by mouth 2 (two) times daily as needed for severe pain. 60 tablet 0   HYDROcodone-acetaminophen (NORCO) 10-325 MG tablet Take 1 tablet by mouth 2 (two) times daily as needed for  severe pain. 60 tablet 0   No facility-administered medications prior to visit.    ROS: Review of Systems  Constitutional:  Negative for activity change, appetite change, chills, fatigue and unexpected weight change.  HENT:  Negative for congestion, mouth sores and sinus pressure.   Eyes:  Negative for visual disturbance.  Respiratory:  Negative for cough and chest tightness.   Gastrointestinal:  Negative for abdominal pain and nausea.  Genitourinary:  Negative for difficulty urinating, frequency and vaginal pain.  Musculoskeletal:  Positive for arthralgias and gait problem. Negative for back pain.  Skin:  Negative for pallor and rash.  Neurological:  Negative for dizziness, tremors, weakness, numbness and headaches.  Psychiatric/Behavioral:  Negative for confusion, sleep disturbance and suicidal ideas. The patient is nervous/anxious.    Objective:  BP 120/68 (BP Location: Left Arm)   Pulse 99   Temp 98.1 F (36.7 C) (Oral)   Ht 5' 2"  (1.575 m)   Wt 166 lb 12.8 oz (75.7 kg)   LMP 03/03/2014   SpO2 98%   BMI 30.51 kg/m   BP Readings from Last 3 Encounters:  01/30/21 120/68  12/12/20 104/81  12/05/20 107/72    Wt Readings from Last 3 Encounters:  01/30/21 166 lb 12.8 oz (75.7 kg)  12/12/20 168 lb 8 oz (76.4 kg)  12/05/20 170 lb 13.7 oz (77.5 kg)    Physical Exam Constitutional:      General: She is not in acute distress.    Appearance: She is well-developed.  HENT:     Head: Normocephalic.     Right Ear: External ear normal.     Left Ear: External ear normal.     Nose: Nose normal.  Eyes:     General:        Right eye: No discharge.        Left eye: No discharge.     Conjunctiva/sclera: Conjunctivae normal.     Pupils: Pupils are equal, round, and reactive to light.  Neck:     Thyroid: No thyromegaly.     Vascular: No JVD.     Trachea: No tracheal deviation.  Cardiovascular:     Rate and Rhythm: Normal rate and regular rhythm.     Heart sounds: Normal  heart sounds.  Pulmonary:     Effort: No respiratory distress.     Breath sounds: No stridor. No wheezing.  Abdominal:     General: Bowel sounds are normal. There is no distension.     Palpations: Abdomen is soft. There is no mass.     Tenderness: There is no abdominal tenderness. There is no guarding or rebound.  Musculoskeletal:        General: Tenderness present.     Cervical back: Normal range of motion and neck supple. No rigidity.  Lymphadenopathy:     Cervical: No cervical adenopathy.  Skin:    Findings: No erythema or rash.  Neurological:     Mental Status: She is oriented to person, place, and time.     Cranial Nerves: No cranial nerve deficit.     Motor: No abnormal muscle tone.     Coordination: Coordination normal.     Gait: Gait normal.     Deep Tendon Reflexes: Reflexes normal.  Psychiatric:        Behavior: Behavior normal.        Thought Content: Thought content normal.        Judgment: Judgment normal.    Lab Results  Component Value Date   WBC 7.7 12/05/2020   HGB 13.9 12/05/2020   HCT 40.4 12/05/2020   PLT 121 (L) 12/05/2020   GLUCOSE 90 12/05/2020   CHOL 168 08/30/2014   TRIG 99.0 08/30/2014   HDL 27.40 (L) 08/30/2014   LDLDIRECT 171.9 05/01/2011   LDLCALC 121 (H) 08/30/2014   ALT 13 12/05/2020   AST 14 (L) 12/05/2020   NA 138 12/05/2020   K 4.3 12/05/2020   CL 107 12/05/2020   CREATININE 1.63 (H) 12/05/2020   BUN 19 12/05/2020   CO2 27 12/05/2020   TSH 1.61 08/01/2020   INR 1.1 12/05/2020    US RENAL  Result Date: 12/20/2020 CLINICAL DATA:  Chronic kidney disease. EXAM: RENAL / URINARY TRACT ULTRASOUND COMPLETE COMPARISON:  CT [facility] 12/10/2018 FINDINGS: Right Kidney: Renal measurements: 10.7 x 4.0 x 4.0 cm = volume: 90 mL. Echogenicity within normal limits. No mass or hydronephrosis visualized. Left Kidney: Renal measurements: 11.1 x 5.1 x 5.9 cm = volume: 176 mL. Lobular contour of the left kidney noted. Echogenicity within normal limits. No  mass or hydronephrosis visualized. Bladder: Appears normal for degree of bladder distention. Other: The spleen is enlarged measuring 17.7 cm in length with a volume of 1075 cc IMPRESSION: 1. No acute findings.  No hydronephrosis.  2. Asymmetric right renal atrophy. 3. Splenomegaly. Electronically Signed   By: Kerby Moors M.D.   On: 12/20/2020 14:17    Assessment & Plan:   Problem List Items Addressed This Visit     B12 nutritional deficiency    Chronic - continue on B12 inj q 2 wks      CRI (chronic renal insufficiency), stage 3 (moderate) (HCC)    Monitoring GFR Hydrate well      Crohn's disease of ileum with fistula (HCC)     Stable. On IV Entyvio q 12 wks      LOW BACK PAIN, CHRONIC    Norco prn. Potential benefits of a long term opioids use as well as potential risks (i.e. addiction risk, apnea etc) and complications (i.e. Somnolence, constipation and others) were explained to the patient and were aknowledged.      Relevant Medications   HYDROcodone-acetaminophen (NORCO) 10-325 MG tablet   HYDROcodone-acetaminophen (NORCO) 10-325 MG tablet   HYDROcodone-acetaminophen (NORCO) 10-325 MG tablet      Meds ordered this encounter  Medications   HYDROcodone-acetaminophen (NORCO) 10-325 MG tablet    Sig: Take 1 tablet by mouth 2 (two) times daily as needed for severe pain.    Dispense:  60 tablet    Refill:  0    Please fill on or after 02/22/21   HYDROcodone-acetaminophen (NORCO) 10-325 MG tablet    Sig: Take 1 tablet by mouth 2 (two) times daily as needed for severe pain.    Dispense:  60 tablet    Refill:  0    Please fill on or after 03/24/21   HYDROcodone-acetaminophen (NORCO) 10-325 MG tablet    Sig: Take 1 tablet by mouth 2 (two) times daily as needed for severe pain.    Dispense:  60 tablet    Refill:  0    Please fill on or after 04/23/21   cyanocobalamin (,VITAMIN B-12,) 1000 MCG/ML injection    Sig: INJECT 1ML INTO THE SKIN EVERY 14 DAYS    Dispense:  6 mL     Refill:  5       Follow-up: Return in about 3 months (around 04/30/2021) for a follow-up visit.  [name], MD

## 2021-02-01 ENCOUNTER — Encounter: Payer: Self-pay | Admitting: Internal Medicine

## 2021-02-01 ENCOUNTER — Telehealth: Payer: Self-pay | Admitting: Internal Medicine

## 2021-02-01 ENCOUNTER — Telehealth (INDEPENDENT_AMBULATORY_CARE_PROVIDER_SITE_OTHER): Payer: 59 | Admitting: Family Medicine

## 2021-02-01 DIAGNOSIS — J40 Bronchitis, not specified as acute or chronic: Secondary | ICD-10-CM | POA: Diagnosis not present

## 2021-02-01 MED ORDER — PREDNISONE 20 MG PO TABS: ORAL_TABLET | ORAL | 0 refills | Status: DC

## 2021-02-01 MED ORDER — AZITHROMYCIN 250 MG PO TABS: ORAL_TABLET | ORAL | 0 refills | Status: AC

## 2021-02-01 NOTE — Telephone Encounter (Signed)
Pt called and states she thinks she has bronchitis. Chest colds. Requesting zpack. States she will be going on a cruise tomorrow. Offered an appt, pt declines.    Callback #- [phone]

## 2021-02-01 NOTE — Telephone Encounter (Signed)
Patient scheduled for a pm virtual appt today

## 2021-02-01 NOTE — Progress Notes (Signed)
Patient ID: [name] [name], female   DOB: 1971/01/01, 50 y.o.   MRN: [id number]   This visit type was conducted due to national recommendations for restrictions regarding the COVID-19 pandemic in an effort to limit this patient's exposure and mitigate transmission in our community.   Virtual Visit via Telephone Note  I connected with Rehmat Murtagh on 02/01/21 at  2:00 PM EST by telephone and verified that I am speaking with the correct person using two identifiers.   I discussed the limitations, risks, security and privacy concerns of performing an evaluation and management service by telephone and the availability of in person appointments. I also discussed with the patient that there may be a patient responsible charge related to this service. The patient expressed understanding and agreed to proceed.  Location patient: home Location provider: work or home office Participants present for the call: patient, provider Patient did not have a visit in the prior 7 days to address this/these issue(s).   History of Present Illness:  [name] [name] called with onset a few days ago of increased cough and some nasal congestion.  She feels like she may have some wheezing.  Her main concern is that she is getting ready to go on a cruise tomorrow and is the first time she has been on a cruise.  She denies any fever.  She has had some cough productive of green sputum.  She feels like she may have some mild wheezing.  She has Breo inhaler at home but has not been using this.  Former smoker.  She does have history of Crohn's disease and is on immunosuppressive therapy related to that.  Past Medical History:  Diagnosis Date   Anxiety    Arthritis    Dr. Eddie Dibbles   Bartholin cyst 2008   vag.   BIPOLAR AFFECTIVE DISORDER 04/14/2007   Crohn's ON ENTYVIO SINCE FEB [id number]   COMPLICATED BY RECTOVAGINAL FISTULA. SJS WITH REMICADE. FAILED HUMIRA.   Depression    Elevated glucose 2010   GERD (gastroesophageal reflux  disease)    Hypertension    Kidney stone    LBP (low back pain)    Dr. Mina Marble   Perianal abscess 2009   Vitamin B12 deficiency    Past Surgical History:  Procedure Laterality Date   BALLOON DILATION N/A 10/15/2019   Procedure: BALLOON DILATION;  Surgeon: Eloise Harman, DO;  [facility]: [facility];  Service: Endoscopy;  Laterality: N/A;   BIOPSY  10/15/2019   Procedure: BIOPSY;  Surgeon: Eloise Harman, DO;  [facility]: [facility];  Service: Endoscopy;;   COLONOSCOPY  09/2008   Dr. Derrill Kay: ulcers, edema, possible fistula openings all seen in the distal 3 cm of anus/anal canal. 1cm pseudopolyp. rest of colon and TI normal. rectal biopsy with mild chronic active colitis. ileum bx normal.   COLONOSCOPY WITH PROPOFOL N/A 03/29/2015   SLF: Severe proctocolitis limitied to cecum and rectum. Limited exam of the colon mucosa and anal canal.    COLONOSCOPY WITH PROPOFOL N/A 10/15/2019   Procedure: COLONOSCOPY WITH PROPOFOL;  Surgeon: Eloise Harman, DO;  Location: [facility];  Service: Endoscopy;  Laterality: N/A;  10:45am   ELBOW SURGERY     left   ESOPHAGOGASTRODUODENOSCOPY (EGD) WITH PROPOFOL N/A 03/29/2015   SLF: 1. Erosive gastritis duodentitis.    ESOPHAGOGASTRODUODENOSCOPY (EGD) WITH PROPOFOL N/A 10/15/2019   Procedure: ESOPHAGOGASTRODUODENOSCOPY (EGD) WITH PROPOFOL;  Surgeon: Eloise Harman, DO;  Location: [facility];  Service: Endoscopy;  Laterality: N/A;   RECTOVAGINAL FISTULA CLOSURE     did not help    reports that she has been smoking cigarettes. She has a 12.50 pack-year smoking history. She has never used smokeless tobacco. She reports that she does not drink alcohol and does not use drugs. family history includes Breast cancer in her maternal grandmother; Cancer in her maternal aunt, maternal grandfather, and maternal grandmother; Diabetes in her mother; Early death (age of onset: 62) in her maternal grandmother; Heart attack in her father and mother; Heart disease in her  father and mother. Allergies  Allergen Reactions   Cephalexin Hives and Itching   Codeine Nausea Only   Guaifenesin    Imitrex [Sumatriptan]     2 fingers got very hot   Lopressor [Metoprolol Tartrate] Nausea Only and Other (See Comments)    Made her feel jittery, sweaty / reacted opposite on her.    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 I do not appreciate any SOB. Speech and thought processing are grossly intact. Patient reported vitals:  Assessment and Plan:  Acute bronchial illness.  We explained that acute bronchitis is frequently viral.  She is concerned because of her immunosuppressant therapy and history of difficulty shaking bronchitis in the past.  She does relate some intermittent wheezing.  Increasingly productive cough past few days  -Prednisone 20 mg 2 tablets once daily for 5 days -Zithromax for 5 days -Follow-up promptly for any persistent or worsening symptoms  Follow Up Instructions   99441 5-10 [id number] 11-20 99443 21-30 I did not refer this patient for an OV in the next 24 hours for this/these issue(s).  I discussed the assessment and treatment plan with the patient. The patient was provided an opportunity to ask questions and all were answered. The patient agreed with the plan and demonstrated an understanding of the instructions.   The patient was advised to call back or seek an in-person evaluation if the symptoms worsen or if the condition fails to improve as anticipated.  I provided 12 minutes of non-face-to-face time during this encounter.   Carolann Littler, MD

## 2021-02-03 NOTE — Telephone Encounter (Signed)
Agree  Thank you

## 2021-02-10 ENCOUNTER — Other Ambulatory Visit: Payer: Self-pay | Admitting: Adult Health

## 2021-02-14 ENCOUNTER — Encounter (HOSPITAL_COMMUNITY)
Admission: RE | Admit: 2021-02-14 | Discharge: 2021-02-14 | Disposition: A | Discharge status: Home / Self Care | Payer: 59 | Source: Ambulatory Visit | Attending: Gastroenterology | Admitting: Gastroenterology

## 2021-03-07 ENCOUNTER — Encounter: Payer: Self-pay | Admitting: Internal Medicine

## 2021-04-17 ENCOUNTER — Other Ambulatory Visit: Payer: Self-pay

## 2021-04-17 ENCOUNTER — Encounter: Payer: Self-pay | Admitting: Internal Medicine

## 2021-04-17 ENCOUNTER — Ambulatory Visit (INDEPENDENT_AMBULATORY_CARE_PROVIDER_SITE_OTHER): Payer: 59 | Admitting: Internal Medicine

## 2021-04-17 DIAGNOSIS — G8929 Other chronic pain: Secondary | ICD-10-CM

## 2021-04-17 DIAGNOSIS — N183 Chronic kidney disease, stage 3 unspecified: Secondary | ICD-10-CM | POA: Diagnosis not present

## 2021-04-17 DIAGNOSIS — N824 Other female intestinal-genital tract fistulae: Secondary | ICD-10-CM

## 2021-04-17 DIAGNOSIS — F419 Anxiety disorder, unspecified: Secondary | ICD-10-CM

## 2021-04-17 DIAGNOSIS — E538 Deficiency of other specified B group vitamins: Secondary | ICD-10-CM | POA: Diagnosis not present

## 2021-04-17 DIAGNOSIS — E669 Obesity, unspecified: Secondary | ICD-10-CM | POA: Diagnosis not present

## 2021-04-17 DIAGNOSIS — M5440 Lumbago with sciatica, unspecified side: Secondary | ICD-10-CM | POA: Diagnosis not present

## 2021-04-17 DIAGNOSIS — Z6831 Body mass index (BMI) 31.0-31.9, adult: Secondary | ICD-10-CM

## 2021-04-17 MED ORDER — HYDROCODONE-ACETAMINOPHEN 10-325 MG PO TABS: 1.0000 | ORAL_TABLET | Freq: Two times a day (BID) | ORAL | 0 refills | Status: DC | PRN

## 2021-04-17 NOTE — Assessment & Plan Note (Signed)
Chronic and severe Norco prn. Toradol prn - rare use On disabiity  Norco prn. Potential benefits of a long term opioids use as well as potential risks (i.e. addiction risk, apnea etc) and complications (i.e. Somnolence, constipation and others) were explained to the patient and were aknowledged.  

## 2021-04-17 NOTE — Progress Notes (Signed)
Subjective:  Patient ID: [name] [name], female    DOB: 04/05/1970  Age: 51 y.o. MRN: [id number]  CC: No chief complaint on file.   HPI [name] [name] presents for LBP, anxiety The pt had flu and COVID in Dec 2022 on a cruise [place] now - less swelling  Outpatient Medications Prior to Visit  Medication Sig Dispense Refill   ALPRAZolam (XANAX) 1 MG tablet Take 1 tablet (1 mg total) by mouth 2 (two) times daily. 30 tablet 2   benazepril (LOTENSIN) 20 MG tablet TAKE 1 TABLET BY MOUTH EVERY DAY 90 tablet 2   BREO ELLIPTA 100-25 MCG/INH AEPB Inhale 1 puff into the lungs as needed.     Cholecalciferol (VITAMIN D3) 250 MCG (10000 UT) capsule Take 30,000 Units by mouth once a week.     cyanocobalamin (,VITAMIN B-12,) 1000 MCG/ML injection INJECT 1ML INTO THE SKIN EVERY 14 DAYS 6 mL 5   ketorolac (TORADOL) 10 MG tablet Take 1 tablet (10 mg total) by mouth every 8 (eight) hours as needed for severe pain. 30 tablet 0   lamoTRIgine (LAMICTAL) 200 MG tablet Take 200 mg by mouth daily.      montelukast (SINGULAIR) 10 MG tablet Take 10 mg by mouth daily.     norethindrone (AYGESTIN) 5 MG tablet TAKE 1 TABLET (5 MG TOTAL) BY MOUTH DAILY. 90 tablet 2   pantoprazole (PROTONIX) 40 MG tablet 1 po qam 90 tablet 3   potassium chloride (KLOR-CON) 10 MEQ tablet Take 1 tablet (10 mEq total) by mouth daily. 90 tablet 3   sertraline (ZOLOFT) 100 MG tablet Take 200 mg by mouth daily.     SYRINGE-NEEDLE, DISP, 3 ML (B-D SYRINGE/NEEDLE 3CC/25GX5/8) 25G X 5/8" 3 ML MISC Use to administer B12 injection every 14 days 24 each 0   vedolizumab (ENTYVIO) 300 MG injection Inject 300 mg into the vein. Every 12 weeks     HYDROcodone-acetaminophen (NORCO) 10-325 MG tablet Take 1 tablet by mouth 2 (two) times daily as needed for severe pain. 60 tablet 0   HYDROcodone-acetaminophen (NORCO) 10-325 MG tablet Take 1 tablet by mouth 2 (two) times daily as needed for severe pain. 60 tablet 0   HYDROcodone-acetaminophen (NORCO)  10-325 MG tablet Take 1 tablet by mouth 2 (two) times daily as needed for severe pain. 60 tablet 0   predniSONE (DELTASONE) 20 MG tablet Take 2 tablets by mouth with food once daily for 5 days 10 tablet 0   No facility-administered medications prior to visit.    ROS: Review of Systems  Constitutional:  Negative for activity change, appetite change, chills, fatigue and unexpected weight change.  HENT:  Negative for congestion, mouth sores and sinus pressure.   Eyes:  Negative for visual disturbance.  Respiratory:  Negative for cough and chest tightness.   Gastrointestinal:  Negative for abdominal pain and nausea.  Genitourinary:  Negative for difficulty urinating, frequency and vaginal pain.  Musculoskeletal:  Positive for back pain. Negative for gait problem.  Skin:  Negative for pallor and rash.  Neurological:  Negative for dizziness, tremors, weakness, numbness and headaches.  Psychiatric/Behavioral:  Negative for confusion, sleep disturbance and suicidal ideas. The patient is nervous/anxious.    Objective:  BP 118/82 (BP Location: Left Arm, Patient Position: Sitting, Cuff Size: Large)    Pulse 93    Temp 98.3 F (36.8 C) (Oral)    Ht 5\' 2"  (1.575 m)    Wt 159 lb (72.1 kg)    LMP  03/03/2014    SpO2 96%    BMI 29.08 kg/m   BP Readings from Last 3 Encounters:  04/17/21 118/82  01/30/21 120/68  12/12/20 104/81    Wt Readings from Last 3 Encounters:  04/17/21 159 lb (72.1 kg)  01/30/21 166 lb 12.8 oz (75.7 kg)  12/12/20 168 lb 8 oz (76.4 kg)    Physical Exam Constitutional:      General: She is not in acute distress.    Appearance: She is well-developed.  HENT:     Head: Normocephalic.     Right Ear: External ear normal.     Left Ear: External ear normal.     Nose: Nose normal.  Eyes:     General:        Right eye: No discharge.        Left eye: No discharge.     Conjunctiva/sclera: Conjunctivae normal.     Pupils: Pupils are equal, round, and reactive to light.   Neck:     Thyroid: No thyromegaly.     Vascular: No JVD.     Trachea: No tracheal deviation.  Cardiovascular:     Rate and Rhythm: Normal rate and regular rhythm.     Heart sounds: Normal heart sounds.  Pulmonary:     Effort: No respiratory distress.     Breath sounds: No stridor. No wheezing.  Abdominal:     General: Bowel sounds are normal. There is no distension.     Palpations: Abdomen is soft. There is no mass.     Tenderness: There is no abdominal tenderness. There is no guarding or rebound.  Musculoskeletal:        General: No tenderness.     Cervical back: Normal range of motion and neck supple. No rigidity.  Lymphadenopathy:     Cervical: No cervical adenopathy.  Skin:    Findings: No erythema or rash.  Neurological:     Mental Status: She is oriented to person, place, and time.     Cranial Nerves: No cranial nerve deficit.     Motor: No abnormal muscle tone.     Coordination: Coordination normal.     Deep Tendon Reflexes: Reflexes normal.  Psychiatric:        Behavior: Behavior normal.        Thought Content: Thought content normal.        Judgment: Judgment normal.  LS w/pain  Lab Results  Component Value Date   WBC 7.7 12/05/2020   HGB 13.9 12/05/2020   HCT 40.4 12/05/2020   PLT 121 (L) 12/05/2020   GLUCOSE 90 12/05/2020   CHOL 168 08/30/2014   TRIG 99.0 08/30/2014   HDL 27.40 (L) 08/30/2014   LDLDIRECT 171.9 05/01/2011   LDLCALC 121 (H) 08/30/2014   ALT 13 12/05/2020   AST 14 (L) 12/05/2020   NA 138 12/05/2020   K 4.3 12/05/2020   CL 107 12/05/2020   CREATININE 1.63 (H) 12/05/2020   BUN 19 12/05/2020   CO2 27 12/05/2020   TSH 1.61 08/01/2020   INR 1.1 12/05/2020    No results found.  Assessment & Plan:   Problem List Items Addressed This Visit     Anxiety    Xanax prn  Potential benefits of a long term benzodiazepines  use as well as potential risks  and complications were explained to the patient and were aknowledged. Do not take w/pain  meds.      B12 nutritional deficiency    On B12 inj  CRI (chronic renal insufficiency), stage 3 (moderate) (HCC)    Monitoring GFR Hydrate well      LOW BACK PAIN, CHRONIC    Chronic and severe Norco prn. Toradol prn - rare use On disabiity  Norco prn. Potential benefits of a long term opioids use as well as potential risks (i.e. addiction risk, apnea etc) and complications (i.e. Somnolence, constipation and others) were explained to the patient and were aknowledged.      Relevant Medications   HYDROcodone-acetaminophen (NORCO) 10-325 MG tablet   HYDROcodone-acetaminophen (NORCO) 10-325 MG tablet   HYDROcodone-acetaminophen (NORCO) 10-325 MG tablet   Obesity    Wt Readings from Last 3 Encounters:  04/17/21 159 lb (72.1 kg)  01/30/21 166 lb 12.8 oz (75.7 kg)  12/12/20 168 lb 8 oz (76.4 kg)  Lost wt on diet Off [place] now - less swelling      RECTOVAGINAL FISTULA    Off Entyvio now         Meds ordered this encounter  Medications   HYDROcodone-acetaminophen (NORCO) 10-325 MG tablet    Sig: Take 1 tablet by mouth 2 (two) times daily as needed for severe pain.    Dispense:  60 tablet    Refill:  0    Please fill on or after 07/22/21   HYDROcodone-acetaminophen (NORCO) 10-325 MG tablet    Sig: Take 1 tablet by mouth 2 (two) times daily as needed for severe pain.    Dispense:  60 tablet    Refill:  0    Please fill on or after 06/22/21   HYDROcodone-acetaminophen (NORCO) 10-325 MG tablet    Sig: Take 1 tablet by mouth 2 (two) times daily as needed for severe pain.    Dispense:  60 tablet    Refill:  0    Please fill on or after 05/23/21      Follow-up: Return in about 3 months (around 07/15/2021) for a follow-up visit.  [name], MD

## 2021-04-17 NOTE — Assessment & Plan Note (Signed)
On B12 inj 

## 2021-04-17 NOTE — Assessment & Plan Note (Signed)
Monitoring GFR Hydrate well 

## 2021-04-17 NOTE — Assessment & Plan Note (Signed)
Off Entyvio now

## 2021-04-17 NOTE — Assessment & Plan Note (Addendum)
Wt Readings from Last 3 Encounters:  04/17/21 159 lb (72.1 kg)  01/30/21 166 lb 12.8 oz (75.7 kg)  12/12/20 168 lb 8 oz (76.4 kg)   Lost wt on diet Off Entyvio now - less swelling

## 2021-04-17 NOTE — Assessment & Plan Note (Signed)
Xanax prn  Potential benefits of a long term benzodiazepines  use as well as potential risks  and complications were explained to the patient and were aknowledged. Do not take w/pain meds. 

## 2021-05-04 ENCOUNTER — Ambulatory Visit: Payer: 59 | Admitting: Internal Medicine

## 2021-05-11 ENCOUNTER — Encounter: Payer: Self-pay | Admitting: Internal Medicine

## 2021-05-12 ENCOUNTER — Other Ambulatory Visit: Payer: Self-pay | Admitting: Internal Medicine

## 2021-05-12 MED ORDER — DICLOFENAC SODIUM 1 % EX GEL: 4.0000 g | Freq: Four times a day (QID) | CUTANEOUS | 3 refills | Status: DC

## 2021-05-24 ENCOUNTER — Other Ambulatory Visit: Payer: Self-pay

## 2021-05-24 ENCOUNTER — Ambulatory Visit (INDEPENDENT_AMBULATORY_CARE_PROVIDER_SITE_OTHER): Payer: 59 | Admitting: Internal Medicine

## 2021-05-24 ENCOUNTER — Other Ambulatory Visit (HOSPITAL_COMMUNITY)
Admission: RE | Admit: 2021-05-24 | Discharge: 2021-05-24 | Disposition: A | Discharge status: Home / Self Care | Payer: 59 | Source: Ambulatory Visit | Attending: Internal Medicine | Admitting: Internal Medicine

## 2021-05-24 VITALS — BP 108/60 | HR 100 | Temp 97.1°F | Ht 62.0 in | Wt 156.6 lb

## 2021-05-24 DIAGNOSIS — K50013 Crohn's disease of small intestine with fistula: Secondary | ICD-10-CM | POA: Diagnosis present

## 2021-05-24 DIAGNOSIS — N183 Chronic kidney disease, stage 3 unspecified: Secondary | ICD-10-CM | POA: Diagnosis present

## 2021-05-24 DIAGNOSIS — N824 Other female intestinal-genital tract fistulae: Secondary | ICD-10-CM | POA: Diagnosis not present

## 2021-05-24 LAB — VITAMIN B12: Vitamin B-12: 438 pg/mL (ref 180–914)

## 2021-05-24 LAB — COMPREHENSIVE METABOLIC PANEL
ALT: 10 U/L (ref 0–44)
AST: 12 U/L — ABNORMAL LOW (ref 15–41)
Albumin: 4.3 g/dL (ref 3.5–5.0)
Alkaline Phosphatase: 57 U/L (ref 38–126)
Anion gap: 9 (ref 5–15)
BUN: 18 mg/dL (ref 6–20)
CO2: 23 mmol/L (ref 22–32)
Calcium: 9.1 mg/dL (ref 8.9–10.3)
Chloride: 109 mmol/L (ref 98–111)
Creatinine, Ser: 1.58 mg/dL — ABNORMAL HIGH (ref 0.44–1.00)
GFR, Estimated: 39 mL/min — ABNORMAL LOW (ref >60)
Glucose, Bld: 128 mg/dL — ABNORMAL HIGH (ref 70–99)
Potassium: 3.8 mmol/L (ref 3.5–5.1)
Sodium: 141 mmol/L (ref 135–145)
Total Bilirubin: 1 mg/dL (ref 0.3–1.2)
Total Protein: 7.2 g/dL (ref 6.5–8.1)

## 2021-05-24 LAB — CBC
HCT: 38.6 % (ref 36.0–46.0)
Hemoglobin: 13.3 g/dL (ref 12.0–15.0)
MCH: 31.6 pg (ref 26.0–34.0)
MCHC: 34.5 g/dL (ref 30.0–36.0)
MCV: 91.7 fL (ref 80.0–100.0)
Platelets: 131 10*3/uL — ABNORMAL LOW (ref 150–400)
RBC: 4.21 MIL/uL (ref 3.87–5.11)
RDW: 13.2 % (ref 11.5–15.5)
WBC: 8.4 10*3/uL (ref 4.0–10.5)
nRBC: 0 % (ref 0.0–0.2)

## 2021-05-24 LAB — VITAMIN D 25 HYDROXY (VIT D DEFICIENCY, FRACTURES): Vit D, 25-Hydroxy: 88.58 ng/mL (ref 30–100)

## 2021-05-24 LAB — C-REACTIVE PROTEIN: CRP: 1.3 mg/dL — ABNORMAL HIGH (ref <1.0)

## 2021-05-24 NOTE — Patient Instructions (Signed)
I am going to order blood work today to be performed at the hospital check your blood counts, liver, kidneys, electrolytes, B12, vitamin D. ? ?I am also going to order a CT to evaluate your Crohn's disease as well as your fistula. ? ?Follow-up in 4 months or sooner if needed. ? ?It was very nice seeing you again today.  Thank you so much for the present for [facility].  He is going to be so excited! ? ?Dr. Abbey Chatters  ?

## 2021-05-25 ENCOUNTER — Telehealth: Payer: Self-pay

## 2021-05-25 NOTE — Telephone Encounter (Signed)
PA for CT abd/pelvis w/contrast submitted via [facility] website (for [place]). Case approved. Notification# [id number], valid till 07/09/21. ? ?No PA needed for [facility] Medicare. ?

## 2021-06-06 ENCOUNTER — Inpatient Hospital Stay (HOSPITAL_COMMUNITY): Payer: 59

## 2021-06-13 ENCOUNTER — Inpatient Hospital Stay (HOSPITAL_COMMUNITY): Payer: 59 | Attending: Hematology | Admitting: Hematology

## 2021-06-13 VITALS — BP 105/70 | HR 102 | Temp 98.4°F | Resp 18 | Ht 62.0 in | Wt 154.2 lb

## 2021-06-13 DIAGNOSIS — R161 Splenomegaly, not elsewhere classified: Secondary | ICD-10-CM | POA: Diagnosis not present

## 2021-06-13 DIAGNOSIS — D696 Thrombocytopenia, unspecified: Secondary | ICD-10-CM | POA: Insufficient documentation

## 2021-06-13 DIAGNOSIS — Z79899 Other long term (current) drug therapy: Secondary | ICD-10-CM | POA: Insufficient documentation

## 2021-06-13 DIAGNOSIS — K5090 Crohn's disease, unspecified, without complications: Secondary | ICD-10-CM | POA: Insufficient documentation

## 2021-06-13 NOTE — Progress Notes (Signed)
? ?[facility] ?[address] ?[place], [place] [address] ? ? ?CLINIC:  ?Medical Oncology/Hematology ? ?PCP:  ?Plotnikov, Evie Lacks, MD ?[address] [place] [address]  ?[phone] ? ?REASON FOR VISIT:  ?Follow-up for thrombocytopenia ? ?PRIOR THERAPY: none ? ?CURRENT THERAPY: surveillance ? ?INTERVAL HISTORY:  ?Ms. ODA PLACKE, a 51 y.o. female, returns for routine follow-up for her thrombocytopenia. Shianna was last seen on 12/12/2020. ? ?Today she reports feeling good. She reports she has had no bleeding, bruising, and nosebleeds since stopping Entyvio in December 2022. She denies fevers and night sweats.  ? ?REVIEW OF SYSTEMS:  ?Review of Systems  ?Constitutional:  Negative for appetite change and fatigue.  ?HENT:   Negative for nosebleeds.   ?Respiratory:  Negative for hemoptysis.   ?Gastrointestinal:  Positive for constipation and diarrhea. Negative for blood in stool.  ?Genitourinary:  Negative for hematuria.   ?Hematological:  Does not bruise/bleed easily.  ?All other systems reviewed and are negative. ? ?PAST MEDICAL/SURGICAL HISTORY:  ?Past Medical History:  ?Diagnosis Date  ? Anxiety   ? Arthritis   ? Dr. Eddie Dibbles  ? Bartholin cyst 2008  ? vag.  ? BIPOLAR AFFECTIVE DISORDER 04/14/2007  ? Crohn's ON ENTYVIO SINCE FEB 2017 2000  ? COMPLICATED BY RECTOVAGINAL FISTULA. SJS WITH REMICADE. FAILED HUMIRA.  ? Depression   ? Elevated glucose 2010  ? GERD (gastroesophageal reflux disease)   ? Hypertension   ? Kidney stone   ? LBP (low back pain)   ? Dr. Mina Marble  ? Perianal abscess 2009  ? Vitamin B12 deficiency   ? ?Past Surgical History:  ?Procedure Laterality Date  ? BALLOON DILATION N/A 10/15/2019  ? Procedure: BALLOON DILATION;  Surgeon: Eloise Harman, DO;  [facility];  Service: Endoscopy;  Laterality: N/A;  ? BIOPSY  10/15/2019  ? Procedure: BIOPSY;  Surgeon: Eloise Harman, DO;  [facility];  Service: Endoscopy;;  ? COLONOSCOPY  09/2008  ? Dr. Derrill Kay: ulcers, edema,  possible fistula openings all seen in the distal 3 cm of anus/anal canal. 1cm pseudopolyp. rest of colon and TI normal. rectal biopsy with mild chronic active colitis. ileum bx normal.  ? COLONOSCOPY WITH PROPOFOL N/A 03/29/2015  ? [facility]: Severe proctocolitis limitied to cecum and rectum. Limited exam of the colon mucosa and anal canal.   ? COLONOSCOPY WITH PROPOFOL N/A 10/15/2019  ? Procedure: COLONOSCOPY WITH PROPOFOL;  Surgeon: Eloise Harman, DO;  [facility];  Service: Endoscopy;  Laterality: N/A;  10:45am  ? ELBOW SURGERY    ? left  ? ESOPHAGOGASTRODUODENOSCOPY (EGD) WITH PROPOFOL N/A 03/29/2015  ? SL[facility]: 1. Erosive gastritis duodentitis.   ? ESOPHAGOGASTRODUODENOSCOPY (EGD) WITH PROPOFOL N/A 10/15/2019  ? Procedure: ESOPHAGOGASTRODUODENOSCOPY (EGD) WITH PROPOFOL;  Surgeon: Eloise Harman, DO;  [facility];  Service: Endoscopy;  Laterality: N/A;  ? RECTOVAGINAL FISTULA CLOSURE    ? did not help  ? ? ?SOCIAL HISTORY:  ?Social History  ? ?Socioeconomic History  ? Marital status: Married  ?  Spouse name: Not on file  ? Number of children: Not on file  ? Years of education: Not on file  ? Highest education level: Not on file  ?Occupational History  ? Not on file  ?Tobacco Use  ? Smoking status: Every Day  ?  Packs/day: 0.50  ?  Years: 25.00  ?  Pack years: 12.50  ?  Types: Cigarettes  ?  Last attempt to  quit: 12/21/2018  ?  Years since quitting: 2.4  ? Smokeless tobacco: Never  ? Tobacco comments:  ?  Started back   ?Vaping Use  ? Vaping Use: Never used  ?Substance and Sexual Activity  ? Alcohol use: No  ? Drug use: No  ? Sexual activity: Not Currently  ?  Partners: Male  ?  Birth control/protection: Pill  ?Other Topics Concern  ? Not on file  ?Social History Narrative  ? Regular Exercise-no  ? ?Social Determinants of Health  ? ?Financial Resource Strain: Not on file  ?Food Insecurity: Not on file  ?Transportation Needs: Not on file  ?Physical Activity: Not on file  ?Stress: Not on file  ?Social  Connections: Not on file  ?Intimate Partner Violence: Not on file  ? ? ?FAMILY HISTORY:  ?Family History  ?Problem Relation Age of Onset  ? Diabetes Mother   ? Heart disease Mother   ? Heart attack Mother   ? Cancer Maternal Aunt   ?     ovarian  ? Breast cancer Maternal Grandmother   ? Early death Maternal Grandmother 15  ? Cancer Maternal Grandmother   ?     breast  ? Heart attack Father   ? Heart disease Father   ? Cancer Maternal Grandfather   ?     colon  ? ? ?CURRENT MEDICATIONS:  ?Current Outpatient Medications  ?Medication Sig Dispense Refill  ? ALPRAZolam (XANAX) 1 MG tablet Take 1 tablet (1 mg total) by mouth 2 (two) times daily. 30 tablet 2  ? benazepril (LOTENSIN) 20 MG tablet TAKE 1 TABLET BY MOUTH EVERY DAY 90 tablet 2  ? BREO ELLIPTA 100-25 MCG/INH AEPB Inhale 1 puff into the lungs as needed.    ? Cholecalciferol (VITAMIN D3) 250 MCG (10000 UT) capsule Take 30,000 Units by mouth once a week.    ? cyanocobalamin (,VITAMIN B-12,) 1000 MCG/ML injection INJECT 1ML INTO THE SKIN EVERY 14 DAYS 6 mL 5  ? diclofenac Sodium (VOLTAREN) 1 % GEL Apply 4 g topically 4 (four) times daily. 100 g 3  ? HYDROcodone-acetaminophen (NORCO) 10-325 MG tablet Take 1 tablet by mouth 2 (two) times daily as needed for severe pain. 60 tablet 0  ? HYDROcodone-acetaminophen (NORCO) 10-325 MG tablet Take 1 tablet by mouth 2 (two) times daily as needed for severe pain. 60 tablet 0  ? HYDROcodone-acetaminophen (NORCO) 10-325 MG tablet Take 1 tablet by mouth 2 (two) times daily as needed for severe pain. 60 tablet 0  ? ketorolac (TORADOL) 10 MG tablet Take 1 tablet (10 mg total) by mouth every 8 (eight) hours as needed for severe pain. 30 tablet 0  ? lamoTRIgine (LAMICTAL) 200 MG tablet Take 200 mg by mouth daily.     ? montelukast (SINGULAIR) 10 MG tablet Take 10 mg by mouth daily.    ? norethindrone (AYGESTIN) 5 MG tablet TAKE 1 TABLET (5 MG TOTAL) BY MOUTH DAILY. 90 tablet 2  ? pantoprazole (PROTONIX) 40 MG tablet 1 po qam 90  tablet 3  ? potassium chloride (KLOR-CON) 10 MEQ tablet Take 1 tablet (10 mEq total) by mouth daily. 90 tablet 3  ? sertraline (ZOLOFT) 100 MG tablet Take 200 mg by mouth daily.    ? SYRINGE-NEEDLE, DISP, 3 ML (B-D SYRINGE/NEEDLE 3CC/25GX5/8) 25G X 5/8" 3 ML MISC Use to administer B12 injection every 14 days 24 each 0  ? vedolizumab (ENTYVIO) 300 MG injection Inject 300 mg into the vein. Every 12 weeks    ? ?  No current facility-administered medications for this visit.  ? ? ?ALLERGIES:  ?Allergies  ?Allergen Reactions  ? Cephalexin Hives and Itching  ? Codeine Nausea Only  ? Guaifenesin   ? Imitrex [Sumatriptan]   ?  2 fingers got very hot  ? Lopressor [Metoprolol Tartrate] Nausea Only and Other (See Comments)  ?  Made her feel jittery, sweaty / reacted opposite on her.   ? Morphine   ?  Doesn't work for her, can not move but can still feel the pain  ? Remicade [Infliximab]   ?  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 Sulfonamide Derivatives   ?  Does not tolerate well  ? Ultram [Tramadol Hcl]   ?  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 ? ?PHYSICAL EXAM:  ?Performance status (ECOG): 1 - Symptomatic but completely ambulatory ? ?Vitals:  ? 06/13/21 0813  ?BP: 105/70  ?Pulse: (!) 102  ?Resp: 18  ?Temp: 98.4 ?F (36.9 ?C)  ?SpO2: 96%  ? ?Wt Readings from Last 3 Encounters:  ?06/13/21 154 lb 3.2 oz (69.9 kg)  ?05/24/21 156 lb 9.6 oz (71 kg)  ?04/17/21 159 lb (72.1 kg)  ? ?Physical Exam ?Vitals reviewed.  ?Constitutional:   ?   Appearance: Normal appearance.  ?Cardiovascular:  ?   Rate and Rhythm: Normal rate and regular rhythm.  ?   Pulses: Normal pulses.  ?   Heart sounds: Normal heart sounds.  ?Pulmonary:  ?   Effort: Pulmonary effort is normal.  ?   Breath sounds: Normal breath sounds.  ?Abdominal:  ?   Palpations: Abdomen is soft. There is no hepatomegaly, splenomegaly (palpable 4 fingerbreaths below costal margin) or mass.  ?   Tenderness: There is no abdominal tenderness.   ?Musculoskeletal:  ?   Right lower leg: No edema.  ?   Left lower leg: No edema.  ?Lymphadenopathy:  ?   Cervical: No cervical adenopathy.  ?   Right cervical: No superficial cervical adenopathy. ?   Left cer

## 2021-06-13 NOTE — Patient Instructions (Addendum)
[facility] at [facility] ?Discharge Instructions ? ?You were seen and examined today by Dr. Delton Coombes. ? ?Dr. Delton Coombes discussed your most recent lab work. Your platelets remain stable. There is no need for additional testing or intervention at this time. ? ?Please call the [facility] if your platelets drop below 100. ? ?Follow-up as scheduled. ? ? ? ?Thank you for choosing [facility] at [facility] to provide your oncology and hematology care.  To afford each patient quality time with our provider, please arrive at least 15 minutes before your scheduled appointment time.  ? ?If you have a lab appointment with the [facility] please come in thru the Main Entrance and check in at the main information desk. ? ?You need to re-schedule your appointment should you arrive 10 or more minutes late.  We strive to give you quality time with our providers, and arriving late affects you and other patients whose appointments are after yours.  Also, if you no show three or more times for appointments you may be dismissed from the clinic at the providers discretion.     ?Again, thank you for choosing [facility].  Our hope is that these requests will decrease the amount of time that you wait before being seen by our physicians.       ?_____________________________________________________________ ? ?Should you have questions after your visit to [facility], please contact our office at [phone] and follow the prompts.  Our office hours are 8:00 a.m. and 4:30 p.m. Monday - Friday.  Please note that voicemails left after 4:00 p.m. may not be returned until the following business day.  We are closed weekends and major holidays.  You do have access to a nurse 24-7, just call the main number to the clinic [phone] and do not press any options, hold on the line and a nurse will answer the phone.   ? ?For prescription refill requests, have your  pharmacy contact our office and allow 72 hours.   ? ?Due to Covid, you will need to wear a mask upon entering the hospital. If you do not have a mask, a mask will be given to you at the Main Entrance upon arrival. For doctor visits, patients may have 1 support person age 82 or older with them. For treatment visits, patients can not have anyone with them due to social distancing guidelines and our immunocompromised population.  ? ? ? ?

## 2021-07-03 ENCOUNTER — Ambulatory Visit (HOSPITAL_COMMUNITY)
Admission: RE | Admit: 2021-07-03 | Discharge: 2021-07-03 | Disposition: A | Discharge status: Home / Self Care | Payer: 59 | Source: Ambulatory Visit | Attending: Internal Medicine | Admitting: Internal Medicine

## 2021-07-03 DIAGNOSIS — N824 Other female intestinal-genital tract fistulae: Secondary | ICD-10-CM | POA: Diagnosis present

## 2021-07-03 MED ORDER — IOHEXOL 300 MG/ML  SOLN
100.0000 mL | Freq: Once | INTRAMUSCULAR | Status: AC | PRN
  Administered 2021-07-03: 80 mL via INTRAVENOUS

## 2021-07-11 ENCOUNTER — Ambulatory Visit: Payer: 59 | Admitting: Cardiology

## 2021-07-18 ENCOUNTER — Ambulatory Visit (INDEPENDENT_AMBULATORY_CARE_PROVIDER_SITE_OTHER): Payer: 59 | Admitting: Internal Medicine

## 2021-07-18 ENCOUNTER — Encounter: Payer: Self-pay | Admitting: Internal Medicine

## 2021-07-18 VITALS — BP 100/58 | HR 84 | Temp 98.0°F | Ht 62.0 in | Wt 152.0 lb

## 2021-07-18 DIAGNOSIS — Z0000 Encounter for general adult medical examination without abnormal findings: Secondary | ICD-10-CM

## 2021-07-18 DIAGNOSIS — R7309 Other abnormal glucose: Secondary | ICD-10-CM

## 2021-07-18 DIAGNOSIS — F32A Depression, unspecified: Secondary | ICD-10-CM | POA: Diagnosis not present

## 2021-07-18 DIAGNOSIS — N183 Chronic kidney disease, stage 3 unspecified: Secondary | ICD-10-CM

## 2021-07-18 DIAGNOSIS — F17200 Nicotine dependence, unspecified, uncomplicated: Secondary | ICD-10-CM

## 2021-07-18 DIAGNOSIS — E538 Deficiency of other specified B group vitamins: Secondary | ICD-10-CM

## 2021-07-18 DIAGNOSIS — M5440 Lumbago with sciatica, unspecified side: Secondary | ICD-10-CM

## 2021-07-18 DIAGNOSIS — K50013 Crohn's disease of small intestine with fistula: Secondary | ICD-10-CM

## 2021-07-18 DIAGNOSIS — G8929 Other chronic pain: Secondary | ICD-10-CM

## 2021-07-18 LAB — URINALYSIS, ROUTINE W REFLEX MICROSCOPIC
Bilirubin Urine: NEGATIVE
Ketones, ur: NEGATIVE
Nitrite: NEGATIVE
Specific Gravity, Urine: >=1.03 — AB (ref 1.000–1.030)
Total Protein, Urine: NEGATIVE
Urine Glucose: NEGATIVE
Urobilinogen, UA: 0.2 (ref 0.0–1.0)
pH: 6 (ref 5.0–8.0)

## 2021-07-18 LAB — TSH: TSH: 2.57 u[IU]/mL (ref 0.35–5.50)

## 2021-07-18 LAB — HEMOGLOBIN A1C: Hgb A1c MFr Bld: 4.9 % (ref 4.6–6.5)

## 2021-07-18 MED ORDER — HYDROCODONE-ACETAMINOPHEN 10-325 MG PO TABS: 1.0000 | ORAL_TABLET | Freq: Two times a day (BID) | ORAL | 0 refills | Status: DC | PRN

## 2021-07-18 NOTE — Assessment & Plan Note (Signed)
Pt stopped Entyvio due to infections F/u w/GI

## 2021-07-18 NOTE — Assessment & Plan Note (Signed)
Chronic Cont on Zoloft

## 2021-07-18 NOTE — Assessment & Plan Note (Signed)
Check A1c. 

## 2021-07-18 NOTE — Progress Notes (Signed)
Subjective:  Patient ID: [name] [name], female    DOB: Feb 24, 1970  Age: 51 y.o. MRN: [id number]  CC: No chief complaint on file.   HPI SARANN TREGRE presents for a well exam F/u on LBP, anxiety, B12 def Pt stopped Entyvio   Outpatient Medications Prior to Visit  Medication Sig Dispense Refill   ALPRAZolam (XANAX) 1 MG tablet Take 1 tablet (1 mg total) by mouth 2 (two) times daily. 30 tablet 2   benazepril (LOTENSIN) 20 MG tablet TAKE 1 TABLET BY MOUTH EVERY DAY 90 tablet 2   BREO ELLIPTA 100-25 MCG/INH AEPB Inhale 1 puff into the lungs as needed.     Cholecalciferol (VITAMIN D3) 250 MCG (10000 UT) capsule Take 30,000 Units by mouth once a week.     cyanocobalamin (,VITAMIN B-12,) 1000 MCG/ML injection INJECT 1ML INTO THE SKIN EVERY 14 DAYS 6 mL 5   diclofenac Sodium (VOLTAREN) 1 % GEL Apply 4 g topically 4 (four) times daily. 100 g 3   ketorolac (TORADOL) 10 MG tablet Take 1 tablet (10 mg total) by mouth every 8 (eight) hours as needed for severe pain. 30 tablet 0   lamoTRIgine (LAMICTAL) 200 MG tablet Take 200 mg by mouth daily.      montelukast (SINGULAIR) 10 MG tablet Take 10 mg by mouth daily.     norethindrone (AYGESTIN) 5 MG tablet TAKE 1 TABLET (5 MG TOTAL) BY MOUTH DAILY. 90 tablet 2   pantoprazole (PROTONIX) 40 MG tablet 1 po qam 90 tablet 3   potassium chloride (KLOR-CON) 10 MEQ tablet Take 1 tablet (10 mEq total) by mouth daily. 90 tablet 3   sertraline (ZOLOFT) 100 MG tablet Take 200 mg by mouth daily.     SYRINGE-NEEDLE, DISP, 3 ML (B-D SYRINGE/NEEDLE 3CC/25GX5/8) 25G X 5/8" 3 ML MISC Use to administer B12 injection every 14 days 24 each 0   vedolizumab (ENTYVIO) 300 MG injection Inject 300 mg into the vein. Every 12 weeks     HYDROcodone-acetaminophen (NORCO) 10-325 MG tablet Take 1 tablet by mouth 2 (two) times daily as needed for severe pain. 60 tablet 0   HYDROcodone-acetaminophen (NORCO) 10-325 MG tablet Take 1 tablet by mouth 2 (two) times daily as needed for  severe pain. 60 tablet 0   HYDROcodone-acetaminophen (NORCO) 10-325 MG tablet Take 1 tablet by mouth 2 (two) times daily as needed for severe pain. 60 tablet 0   No facility-administered medications prior to visit.    ROS: Review of Systems  Constitutional:  Negative for activity change, appetite change, chills, fatigue and unexpected weight change.  HENT:  Negative for congestion, mouth sores and sinus pressure.   Eyes:  Negative for visual disturbance.  Respiratory:  Negative for cough and chest tightness.   Gastrointestinal:  Negative for abdominal pain and nausea.  Genitourinary:  Negative for difficulty urinating, frequency and vaginal pain.  Musculoskeletal:  Positive for back pain. Negative for gait problem.  Skin:  Negative for pallor and rash.  Neurological:  Negative for dizziness, tremors, weakness, numbness and headaches.  Psychiatric/Behavioral:  Negative for confusion and sleep disturbance. The patient is nervous/anxious.    Objective:  BP (!) 100/58 (BP Location: Left Arm, Patient Position: Sitting, Cuff Size: Large)   Pulse 84   Temp 98 F (36.7 C) (Oral)   Ht 5' 2"  (1.575 m)   Wt 152 lb (68.9 kg)   LMP 03/03/2014   SpO2 97%   BMI 27.80 kg/m   BP Readings from Last  3 Encounters:  07/18/21 (!) 100/58  06/13/21 105/70  05/24/21 108/60    Wt Readings from Last 3 Encounters:  07/18/21 152 lb (68.9 kg)  06/13/21 154 lb 3.2 oz (69.9 kg)  05/24/21 156 lb 9.6 oz (71 kg)    Physical Exam Constitutional:      General: She is not in acute distress.    Appearance: She is well-developed. She is obese.  HENT:     Head: Normocephalic.     Right Ear: External ear normal.     Left Ear: External ear normal.     Nose: Nose normal.  Eyes:     General:        Right eye: No discharge.        Left eye: No discharge.     Conjunctiva/sclera: Conjunctivae normal.     Pupils: Pupils are equal, round, and reactive to light.  Neck:     Thyroid: No thyromegaly.      Vascular: No JVD.     Trachea: No tracheal deviation.  Cardiovascular:     Rate and Rhythm: Normal rate and regular rhythm.     Heart sounds: Normal heart sounds.  Pulmonary:     Effort: No respiratory distress.     Breath sounds: No stridor. No wheezing.  Abdominal:     General: Bowel sounds are normal. There is no distension.     Palpations: Abdomen is soft. There is no mass.     Tenderness: There is no abdominal tenderness. There is no guarding or rebound.  Musculoskeletal:        General: Tenderness present.     Cervical back: Normal range of motion and neck supple. No rigidity.  Lymphadenopathy:     Cervical: No cervical adenopathy.  Skin:    Findings: No erythema or rash.  Neurological:     Cranial Nerves: No cranial nerve deficit.     Motor: No abnormal muscle tone.     Coordination: Coordination normal.     Deep Tendon Reflexes: Reflexes normal.  Psychiatric:        Behavior: Behavior normal.        Thought Content: Thought content normal.        Judgment: Judgment normal.  LS tender  I spent 22 minutes in addition to time for CPX wellness examination in preparing to see the patient by review of recent labs, imaging and procedures, obtaining and reviewing separately obtained history, communicating with the patient, ordering medications, tests or procedures, and documenting clinical information in the EHR including the differential diagnosis, treatment, and any further evaluation and other management of LBP, anxiety, Crohn's        Lab Results  Component Value Date   WBC 8.4 05/24/2021   HGB 13.3 05/24/2021   HCT 38.6 05/24/2021   PLT 131 (L) 05/24/2021   GLUCOSE 128 (H) 05/24/2021   CHOL 168 08/30/2014   TRIG 99.0 08/30/2014   HDL 27.40 (L) 08/30/2014   LDLDIRECT 171.9 05/01/2011   LDLCALC 121 (H) 08/30/2014   ALT 10 05/24/2021   AST 12 (L) 05/24/2021   NA 141 05/24/2021   K 3.8 05/24/2021   CL 109 05/24/2021   CREATININE 1.58 (H) 05/24/2021   BUN 18  05/24/2021   CO2 23 05/24/2021   TSH 1.61 08/01/2020   INR 1.1 12/05/2020    CT Abdomen Pelvis W Contrast  Result Date: 07/04/2021 CLINICAL DATA:  Crohn disease, rectovaginal fistula EXAM: CT ABDOMEN AND PELVIS WITH CONTRAST TECHNIQUE: Multidetector CT imaging  of the abdomen and pelvis was performed using the standard protocol following bolus administration of intravenous contrast. RADIATION DOSE REDUCTION: This exam was performed according to the departmental dose-optimization program which includes automated exposure control, adjustment of the mA and/or kV according to patient size and/or use of iterative reconstruction technique. CONTRAST:  71m OMNIPAQUE IOHEXOL 300 MG/ML  SOLN COMPARISON:  12/10/2018 FINDINGS: Lower chest: No acute pleural or parenchymal lung disease. Hepatobiliary: No focal liver abnormality is seen. No gallstones, gallbladder wall thickening, or biliary dilatation. Pancreas: Unremarkable. No pancreatic ductal dilatation or surrounding inflammatory changes. Spleen: There is marked splenomegaly, measuring 17.3 x 15.3 x 7.2 cm, not appreciably changed since prior exam. No focal parenchymal abnormality. Adrenals/Urinary Tract: Areas of cortical scarring are seen within the upper pole left kidney and lower pole right kidney. No other focal renal abnormalities. No urinary tract calculi or obstruction. Bladder is minimally distended with no gross abnormalities. The adrenals are normal. Stomach/Bowel: No bowel obstruction or ileus. Normal appendix right lower quadrant. Rectal contrast was instilled, with reflux into the distal small bowel. There is scattered diverticulosis of the descending and sigmoid colon without diverticulitis. No evidence of bowel wall thickening or inflammatory change. There is no evidence of rectal contrast extravasation to suggest rectovaginal fistula. Of note, the rectal tube and balloon could obscure a low rectal fistula. Vascular/Lymphatic: No significant vascular  findings are present. No enlarged abdominal or pelvic lymph nodes. Reproductive: Uterus and bilateral adnexa are unremarkable. Other: No free fluid or free intraperitoneal gas. No abdominal wall hernia. Musculoskeletal: No acute or destructive bony lesions. Reconstructed images demonstrate no additional findings. IMPRESSION: 1. Stable marked splenomegaly. 2. Distal colonic diverticulosis without diverticulitis. 3. No evidence of inflammatory bowel disease. No evidence of rectovaginal fistula, though low fistula could be obscured by the indwelling rectal tube and balloon cuff. 4. Bilateral renal cortical scarring. Electronically Signed   By: MRanda NgoM.D.   On: 07/04/2021 16:34    Assessment & Plan:   Problem List Items Addressed This Visit     B12 nutritional deficiency    Chronic - continue on B12 inj q 2 wks      Relevant Orders   Vitamin B12   CRI (chronic renal insufficiency), stage 3 (moderate) (HCC)    Monitoring GFR Hydrate well       [place] with [place]    Pt stopped Entyvio due to infections F/u w/GI      Relevant Orders   Vitamin B12   Depression    Chronic Cont on Zoloft      HYPERGLYCEMIA    Check A1c       Relevant Orders   Hemoglobin A1c   LOW BACK PAIN, CHRONIC    Chronic and severe Norco prn. Toradol prn - rare use On disabiity  Norco prn. Potential benefits of a long term opioids use as well as potential risks (i.e. addiction risk, apnea etc) and complications (i.e. Somnolence, constipation and others) were explained to the patient and were aknowledged.       Relevant Medications   HYDROcodone-acetaminophen (NORCO) 10-325 MG tablet   HYDROcodone-acetaminophen (NORCO) 10-325 MG tablet   HYDROcodone-acetaminophen (NORCO) 10-325 MG tablet   TOBACCO USER    Smoking 1/2 ppd again. Discussed       Well adult exam - Primary    We discussed age appropriate health related issues, including available/recomended screening  tests and vaccinations. Labs were ordered to be later reviewed . All questions were answered.  We discussed one or more of the following - seat belt use, use of sunscreen/sun exposure exercise, fall risk reduction, second hand smoke exposure, firearm use and storage, seat belt use, a need for adhering to healthy diet and exercise. Labs were ordered.  All questions were answered. GYN/PAP/mammo pending Smoking 1/2 ppd again. Discussed      Relevant Orders   TSH   Urinalysis   CBC with Differential/Platelet   Comprehensive metabolic panel   Vitamin B15   Hemoglobin A1c      Meds ordered this encounter  Medications   HYDROcodone-acetaminophen (NORCO) 10-325 MG tablet    Sig: Take 1 tablet by mouth 2 (two) times daily as needed for severe pain.    Dispense:  60 tablet    Refill:  0    Please fill on or after 07/22/21   HYDROcodone-acetaminophen (NORCO) 10-325 MG tablet    Sig: Take 1 tablet by mouth 2 (two) times daily as needed for severe pain.    Dispense:  60 tablet    Refill:  0    Please fill on or after 08/21/21   HYDROcodone-acetaminophen (NORCO) 10-325 MG tablet    Sig: Take 1 tablet by mouth 2 (two) times daily as needed for severe pain.    Dispense:  60 tablet    Refill:  0    Please fill on or after 09/20/21      Follow-up: Return in about 3 months (around 10/18/2021) for a follow-up visit.  [name], MD

## 2021-07-18 NOTE — Assessment & Plan Note (Signed)
Smoking 1/2 ppd again. Discussed

## 2021-07-18 NOTE — Assessment & Plan Note (Addendum)
We discussed age appropriate health related issues, including available/recomended screening tests and vaccinations. Labs were ordered to be later reviewed . All questions were answered. We discussed one or more of the following - seat belt use, use of sunscreen/sun exposure exercise, fall risk reduction, second hand smoke exposure, firearm use and storage, seat belt use, a need for adhering to healthy diet and exercise. Labs were ordered.  All questions were answered. GYN/PAP/mammo pending Smoking 1/2 ppd again. Discussed

## 2021-07-18 NOTE — Assessment & Plan Note (Signed)
Monitoring GFR Hydrate well

## 2021-07-18 NOTE — Assessment & Plan Note (Signed)
Chronic and severe Norco prn. Toradol prn - rare use On disabiity  Norco prn. Potential benefits of a long term opioids use as well as potential risks (i.e. addiction risk, apnea etc) and complications (i.e. Somnolence, constipation and others) were explained to the patient and were aknowledged.

## 2021-07-18 NOTE — Assessment & Plan Note (Signed)
Chronic - continue on B12 inj q 2 wks

## 2021-07-19 ENCOUNTER — Other Ambulatory Visit: Payer: Self-pay | Admitting: Internal Medicine

## 2021-07-25 ENCOUNTER — Encounter: Payer: Self-pay | Admitting: Internal Medicine

## 2021-07-27 ENCOUNTER — Encounter: Payer: Self-pay | Admitting: Internal Medicine

## 2021-07-27 NOTE — Telephone Encounter (Signed)
Pt is calling to get an update on the abx request.   Please advise

## 2021-08-01 ENCOUNTER — Other Ambulatory Visit: Payer: Self-pay | Admitting: Internal Medicine

## 2021-08-01 MED ORDER — CIPROFLOXACIN HCL 250 MG PO TABS: 250.0000 mg | ORAL_TABLET | Freq: Two times a day (BID) | ORAL | 0 refills | Status: DC

## 2021-08-24 ENCOUNTER — Encounter (INDEPENDENT_AMBULATORY_CARE_PROVIDER_SITE_OTHER): Payer: Self-pay

## 2021-09-04 ENCOUNTER — Ambulatory Visit (HOSPITAL_COMMUNITY)
Admission: RE | Admit: 2021-09-04 | Discharge: 2021-09-04 | Disposition: A | Discharge status: Home / Self Care | Payer: 59 | Source: Ambulatory Visit | Attending: Adult Health | Admitting: Adult Health

## 2021-09-04 ENCOUNTER — Other Ambulatory Visit (HOSPITAL_COMMUNITY)
Admission: RE | Admit: 2021-09-04 | Discharge: 2021-09-04 | Disposition: A | Discharge status: Home / Self Care | Payer: 59 | Source: Ambulatory Visit | Attending: Adult Health | Admitting: Adult Health

## 2021-09-04 ENCOUNTER — Encounter: Payer: Self-pay | Admitting: Adult Health

## 2021-09-04 ENCOUNTER — Encounter: Payer: Self-pay | Admitting: Internal Medicine

## 2021-09-04 ENCOUNTER — Ambulatory Visit (INDEPENDENT_AMBULATORY_CARE_PROVIDER_SITE_OTHER): Payer: 59 | Admitting: Adult Health

## 2021-09-04 VITALS — BP 130/85 | HR 80 | Ht 61.0 in | Wt 152.0 lb

## 2021-09-04 DIAGNOSIS — R350 Frequency of micturition: Secondary | ICD-10-CM

## 2021-09-04 DIAGNOSIS — Z01419 Encounter for gynecological examination (general) (routine) without abnormal findings: Secondary | ICD-10-CM | POA: Insufficient documentation

## 2021-09-04 DIAGNOSIS — R309 Painful micturition, unspecified: Secondary | ICD-10-CM | POA: Diagnosis not present

## 2021-09-04 DIAGNOSIS — Z1231 Encounter for screening mammogram for malignant neoplasm of breast: Secondary | ICD-10-CM | POA: Insufficient documentation

## 2021-09-04 DIAGNOSIS — Z1151 Encounter for screening for human papillomavirus (HPV): Secondary | ICD-10-CM | POA: Insufficient documentation

## 2021-09-04 LAB — POCT URINALYSIS DIPSTICK
Glucose, UA: NEGATIVE
Ketones, UA: NEGATIVE
Leukocytes, UA: NEGATIVE
Nitrite, UA: NEGATIVE
Protein, UA: NEGATIVE

## 2021-09-04 NOTE — Progress Notes (Signed)
Patient ID: [name] [name], female   DOB: 1970/10/07, 51 y.o.   MRN: [id number] History of Present Illness: [name] [name] is a 51 year old white female,married, G0P0, in for a well woman gyn exam and she requests pap this year.   Lab Results  Component Value Date   DIAGPAP (A) 05/06/2020    - Atypical squamous cells of undetermined significance (ASC-US)   HPV NOT DETECTED 01/09/2018   [place] Negative 05/06/2020   PCP is Dr [name] [name]  Current Medications, Allergies, Past Medical History, Past Surgical History, Family History and Social History were reviewed in [employer] record.     Review of Systems: Patient denies any headaches, hearing loss, fatigue, blurred vision, shortness of breath, chest pain, abdominal pain, problems with bowel movements, or intercourse(not active). No joint pain or mood swings.  She is having burning with urination and urinary frequency. She is on aygestin and does not have a period. But may spot every 3 years.   Physical Exam:BP 130/85 (BP Location: Left Arm, Patient Position: Sitting, Cuff Size: Normal)   Pulse 80   Ht 5' 1"  (1.549 m)   Wt 152 lb (68.9 kg)   LMP 03/03/2014   BMI 28.72 kg/m   urine dipstick:trace blood.  General:  Well developed, well nourished, no acute distress Skin:  Warm and dry,tan Neck:  Midline trachea, normal thyroid, good ROM, no lymphadenopathy Lungs; Clear to auscultation bilaterally Breast:  No dominant palpable mass, retraction, or nipple discharge Cardiovascular: Regular rate and rhythm Abdomen:  Soft, non tender, no hepatosplenomegaly Pelvic:  External genitalia is normal in appearance, no lesions.  The vagina is pale. Urethra has no lesions or masses. The cervix is smooth, pap with HR HPV genotyping performed.[name]  Uterus is felt to be normal size, shape, and contour.  No adnexal masses or tenderness noted.Bladder is non tender, no masses felt. Rectal:Deferred at her request, has  Crohn's Extremities/musculoskeletal:  No swelling or varicosities noted, no clubbing or cyanosis Psych:  No mood changes, alert and cooperative,seems happy AA is 0 Fall risk is low    09/04/2021    8:28 AM 07/18/2021    7:59 AM 05/06/2020    8:37 AM  Depression screen PHQ 2/9  Decreased Interest 0 0 0  Down, Depressed, Hopeless 0 0 0  PHQ - 2 Score 0 0 0  Altered sleeping 0  0  Tired, decreased energy 1  2  Change in appetite 0  0  Feeling bad or failure about yourself  0  0  Trouble concentrating 0  0  Moving slowly or fidgety/restless 0  0  Suicidal thoughts 0  0  PHQ-9 Score 1  2       09/04/2021    8:28 AM 05/06/2020    8:39 AM 11/12/2016    8:54 AM  GAD 7 : Generalized Anxiety Score  Nervous, Anxious, on Edge 0 0 0  Control/stop worrying 0 0 0  Worry too much - different things 0 0 0  Trouble relaxing 0 0 0  Restless 0 0 0  Easily annoyed or irritable 0 1 0  Afraid - awful might happen 0 0 0  Total GAD 7 Score 0 1 0      Upstream - 09/04/21 0840       Pregnancy Intention Screening   Does the patient want to become pregnant in the next year? No    Does the patient's partner want to become pregnant in the next year? No  Would the patient like to discuss contraceptive options today? No      Contraception Wrap Up   Current Method Abstinence    End Method Abstinence    Contraception Counseling Provided No             Examination chaperoned by [name] Pupa LPN  Impression and Plan: 1. Urinary frequency Had trace blood, will get UA C&S to rule out UTI  - POCT Urinalysis Dipstick - Urine Culture - Urinalysis, Routine w reflex microscopic  2. Pain with urination Will get UA C&S - POCT Urinalysis Dipstick - Urine Culture - Urinalysis, Routine w reflex microscopic  3. Screening mammogram for breast cancer Mammogram scheduled for her now at [facility], was 2019 - MM 3D SCREEN BREAST BILATERAL; Future  4. Encounter for well woman exam with routine  gynecological exam Pap sent at her request Pap in 3 years if normal Physical in 1 year Labs with PCP Colonoscopy per GI last was 10/15/19. - Cytology - PAP( [facility])   She is not ready to stop smoking

## 2021-09-05 LAB — URINALYSIS, ROUTINE W REFLEX MICROSCOPIC
Bilirubin, UA: NEGATIVE
Glucose, UA: NEGATIVE
Ketones, UA: NEGATIVE
Leukocytes,UA: NEGATIVE
Nitrite, UA: NEGATIVE
RBC, UA: NEGATIVE
Specific Gravity, UA: 1.021 (ref 1.005–1.030)
Urobilinogen, Ur: 0.2 mg/dL (ref 0.2–1.0)
pH, UA: 5.5 (ref 5.0–7.5)

## 2021-09-06 LAB — URINE CULTURE

## 2021-09-07 LAB — CYTOLOGY - PAP
Comment: NEGATIVE
Diagnosis: UNDETERMINED — AB
High risk HPV: NEGATIVE

## 2021-09-10 ENCOUNTER — Other Ambulatory Visit: Payer: Self-pay | Admitting: Adult Health

## 2021-09-10 ENCOUNTER — Other Ambulatory Visit: Payer: Self-pay | Admitting: Cardiology

## 2021-09-19 ENCOUNTER — Telehealth: Payer: Self-pay | Admitting: Cardiology

## 2021-09-19 ENCOUNTER — Ambulatory Visit (INDEPENDENT_AMBULATORY_CARE_PROVIDER_SITE_OTHER): Payer: 59

## 2021-09-19 ENCOUNTER — Other Ambulatory Visit: Payer: Self-pay | Admitting: Cardiology

## 2021-09-19 ENCOUNTER — Ambulatory Visit (INDEPENDENT_AMBULATORY_CARE_PROVIDER_SITE_OTHER): Payer: 59 | Admitting: Cardiology

## 2021-09-19 ENCOUNTER — Encounter: Payer: Self-pay | Admitting: Cardiology

## 2021-09-19 VITALS — BP 116/70 | HR 92 | Ht 62.0 in | Wt 152.0 lb

## 2021-09-19 DIAGNOSIS — R002 Palpitations: Secondary | ICD-10-CM

## 2021-09-19 DIAGNOSIS — I421 Obstructive hypertrophic cardiomyopathy: Secondary | ICD-10-CM | POA: Diagnosis not present

## 2021-09-19 NOTE — Telephone Encounter (Signed)
PERCERT:  3 day Zio, clinic placed and enrolled 8/1, JB

## 2021-09-19 NOTE — Patient Instructions (Signed)
Medication Instructions:  Your physician recommends that you continue on your current medications as directed. Please refer to the Current Medication list given to you today.   Labwork: none  Testing/Procedures: Your physician has requested that you have an echocardiogram. Echocardiography is a painless test that uses sound waves to create images of your heart. It provides your doctor with information about the size and shape of your heart and how well your heart's chambers and valves are working. This procedure takes approximately one hour. There are no restrictions for this procedure.   ZIO- Long Term Monitor Instructions   Your physician has requested you wear your ZIO patch monitor 3 days.   This is a single patch monitor.  [employer] supplies one patch monitor per enrollment.  Additional stickers are not available.   Place Patient Log Book in [place] box.  Use locking tab on box and tape box closed securely.  The [employer] has [employer] on it.  Please place in mailbox as soon as possible.  Your physician should have your test results approximately 7 days after the monitor has been mailed back to [facility].   Call [place] at [phone] if you have questions regarding your ZIO XT patch monitor. Call them immediately if you see an orange light blinking on your monitor.   If your monitor falls off in less than 4 days contact our Monitor department at [phone].  If your monitor becomes loose or falls off after 4 days call [employer] at [phone] for suggestions on securing your monitor.   Follow-Up:  Your physician recommends that you schedule a follow-up appointment in: 6 months  Any Other Special Instructions Will Be Listed Below (If Applicable).  If you need a refill on your cardiac medications before your next appointment, please call your pharmacy.

## 2021-09-19 NOTE — Progress Notes (Signed)
Clinical Summary Ms. Brophy is a 51 y.o.female seen today for follow up of the following medical problems.      1. 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  03/2020 cMRI: sigmoid subtype HOCM, max thickness 18 mm with SAM of anterior MV leaflet and mod MR - I have not seen her since 01/2020 when seen with heart murmur   - reported side effects to lopressor.  - no chest pain, no SOB/DOE, no dizziness.  - wall thickness 18 mm, no syncope, no FH early SCD, has not worn monitor or had treadmill test     2. Thrombocytopenia - followed by heme         SH: former EMT, phlebotomist, nurse Past Medical History:  Diagnosis Date   Anxiety    Arthritis    Dr. Eddie Dibbles   Bartholin cyst 2008   vag.   BIPOLAR AFFECTIVE DISORDER 04/14/2007   Crohn's ON ENTYVIO SINCE FEB [phone]   COMPLICATED BY RECTOVAGINAL FISTULA. SJS WITH REMICADE. FAILED HUMIRA.   Depression    Elevated glucose 2010   GERD (gastroesophageal reflux disease)    Hypertension    Kidney stone    LBP (low back pain)    Dr. Mina Marble   Perianal abscess 2009   Vitamin B12 deficiency      Allergies  Allergen Reactions   Cephalexin Hives and Itching   Codeine Nausea Only   Guaifenesin    Imitrex [Sumatriptan]     2 fingers got very hot   Lopressor [Metoprolol Tartrate] Nausea Only and Other (See Comments)    Made her feel jittery, sweaty / reacted opposite on her.    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 30 tablet 2   benazepril (LOTENSIN)  20 MG tablet TAKE 1 TABLET BY MOUTH EVERY DAY 90 tablet 0   BREO ELLIPTA 100-25 MCG/INH AEPB Inhale 1 puff into the lungs as needed.     Cholecalciferol (VITAMIN D3) 250 MCG (10000 UT) capsule Take 30,000 Units by mouth once a week.     cyanocobalamin (,VITAMIN B-12,) 1000 MCG/ML injection INJECT 1ML INTO THE SKIN EVERY 14 DAYS 6 mL 5   diclofenac Sodium (VOLTAREN) 1 % GEL Apply 4 g topically 4 (four) times daily. 100 g 3   HYDROcodone-acetaminophen (NORCO) 10-325 MG tablet Take 1 tablet by mouth 2 (two) times daily as needed for severe pain. 60 tablet 0   HYDROcodone-acetaminophen (NORCO) 10-325 MG tablet Take 1 tablet by mouth 2 (two) times daily as needed for severe pain. 60 tablet 0   HYDROcodone-acetaminophen (NORCO) 10-325 MG tablet Take 1 tablet by mouth 2 (two) times daily as needed for severe pain. 60 tablet 0   ketorolac (TORADOL) 10 MG tablet Take 1 tablet (10 mg total) by mouth every 8 (eight) hours as needed for severe pain. 30 tablet 0   lamoTRIgine (LAMICTAL) 200 MG tablet Take 200 mg by mouth daily.  norethindrone (AYGESTIN) 5 MG tablet TAKE 1 TABLET (5 MG TOTAL) BY MOUTH DAILY. 90 tablet 2   pantoprazole (PROTONIX) 40 MG tablet 1 po qam 90 tablet 3   potassium chloride (KLOR-CON) 10 MEQ tablet TAKE 1 TABLET BY MOUTH EVERY DAY 90 tablet 3   sertraline (ZOLOFT) 100 MG tablet Take 200 mg by mouth daily.     SYRINGE-NEEDLE, DISP, 3 ML (B-D SYRINGE/NEEDLE 3CC/25GX5/8) 25G X 5/8" 3 ML MISC Use to administer B12 injection every 14 days 24 each 0   No current facility-administered medications for this visit.     Past Surgical History:  Procedure Laterality Date   BALLOON DILATION N/A 10/15/2019   Procedure: BALLOON DILATION;  Surgeon: Eloise Harman, DO;  Location: [facility];  Service: Endoscopy;  Laterality: N/A;   BIOPSY  10/15/2019   Procedure: BIOPSY;  Surgeon: Eloise Harman, DO;  Location: [facility];  Service: Endoscopy;;   COLONOSCOPY  09/2008   Dr. Derrill Kay:  ulcers, edema, possible fistula openings all seen in the distal 3 cm of anus/anal canal. 1cm pseudopolyp. rest of colon and TI normal. rectal biopsy with mild chronic active colitis. ileum bx normal.   COLONOSCOPY WITH PROPOFOL N/A 03/29/2015   SLF: Severe proctocolitis limitied to cecum and rectum. Limited exam of the colon mucosa and anal canal.    COLONOSCOPY WITH PROPOFOL N/A 10/15/2019   Procedure: COLONOSCOPY WITH PROPOFOL;  Surgeon: Eloise Harman, DO;  Location: [facility];  Service: Endoscopy;  Laterality: N/A;  10:45am   ELBOW SURGERY     left   ESOPHAGOGASTRODUODENOSCOPY (EGD) WITH PROPOFOL N/A 03/29/2015   SLF: 1. Erosive gastritis duodentitis.    ESOPHAGOGASTRODUODENOSCOPY (EGD) WITH PROPOFOL N/A 10/15/2019   Procedure: ESOPHAGOGASTRODUODENOSCOPY (EGD) WITH PROPOFOL;  Surgeon: Eloise Harman, DO;  Location: [facility];  Service: Endoscopy;  Laterality: N/A;   RECTOVAGINAL FISTULA CLOSURE     did not help     Allergies  Allergen Reactions   Cephalexin Hives and Itching   Codeine Nausea Only   Guaifenesin    Imitrex [Sumatriptan]     2 fingers got very hot   Lopressor [Metoprolol Tartrate] Nausea Only and Other (See Comments)    Made her feel jittery, sweaty / reacted opposite on her.    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 Malanowski reports that she has been smoking cigarettes. She has a 12.50 pack-year smoking history. She has never used smokeless tobacco. Ms. Sandstrom reports no history of alcohol use.  Review of Systems CONSTITUTIONAL: No weight loss, fever, chills, weakness or fatigue.  HEENT: Eyes: No visual loss, blurred vision, double vision or yellow sclerae.No hearing loss, sneezing, congestion, runny nose or sore throat.  SKIN: No rash or itching.  CARDIOVASCULAR: per hpi RESPIRATORY: No shortness of breath, cough or sputum.  GASTROINTESTINAL: No anorexia, nausea, vomiting or diarrhea. No abdominal pain or blood.  GENITOURINARY: No burning on urination, no polyuria NEUROLOGICAL: No headache, dizziness, syncope, paralysis, ataxia, numbness or tingling in the extremities. No change in bowel or bladder control.  MUSCULOSKELETAL: No muscle, back pain, joint pain or stiffness.  LYMPHATICS: No enlarged nodes. No history of splenectomy.  PSYCHIATRIC: No history of depression or anxiety.  ENDOCRINOLOGIC: No reports of sweating, cold or heat intolerance. No polyuria or polydipsia.  [name]   Physical Examination Today's Vitals   09/19/21 0825  BP: 116/70  Pulse: 92  SpO2: 98%  Weight: 152 lb (68.9 kg)  Height: 5' 2"  (1.575 m)   Body mass index is 27.8 kg/m.  Gen: resting comfortably, no acute distress HEENT: no scleral icterus, pupils equal round and reactive, no palptable cervical adenopathy,  CV: RRR, 3/6 systolic murmur rusb no jvd Resp: Clear to auscultation bilaterally GI: abdomen is soft, non-tender, non-distended, normal bowel sounds, no hepatosplenomegaly MSK: extremities are warm, no edema.  Skin: warm, no rash Neuro:  no focal deficits Psych: appropriate affect   Diagnostic Studies  Jan 2022 echo IMPRESSIONS     1. Left ventricular ejection fraction, by estimation, is 60 to 65%. The  left ventricle has normal function. The  left ventricle has no regional  wall motion abnormalities. There is moderate asymmetric left ventricular  hypertrophy of the septal segment  based on limited views (study measurements are incorrect). Left  ventricular diastolic parameters are indeterminate.   2. Right ventricular systolic function is normal. The right ventricular  size is normal.   3. The mitral valve is grossly normal. No evidence of mitral valve  regurgitation.   4. The aortic valve is tricuspid and appears to have normal cusp  excursion based on limited views. Aortic valve regurgitation is not  visualized. Increased velocity across LVOT and AV likely due to asymmetric  LV septal hypertrophy (possible SAM as well)  although cannot exclude other source of subvalvular stenosis. Aortic valve  mean gradient measures 35.0 mmHg. Aortic valve Vmax measures 4.24 m/s.  Consider cardiac MRI for further evaluation.   5. The inferior vena cava is normal in size with greater than 50%  respiratory variability, suggesting right atrial pressure of 3 mmHg.    03/2020 cMRI  IMPRESSION: 1. Hypertrophic cardiomyopathy, sigmoid subtype. Maximal wall thickness 18 mm at basal anteroseptum.   2. 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   3. Focal delayed myocardial enhancement in basal anteroseptum. Total LGE is approximately 8% of myocardial mass.   4.  No LV apical aneurysm.   5.  Normal LV size and function, LVEF 57%.   6.  Normal RV size and function, RVEF 68%.   7. Resting first pass perfusion shows mild subendocardial perfusion defect in mid lateral wall. No associated wall motion abnormality.   Assessment and Plan   1. HOCM - no symptoms - side effects to lopressor, now off. In absence of symptomst not imperative to find substitute, could consider diltiazem in the future. Continuing to stay well hydrated - no clear risk factors to  warrant ICD, she needs  a home monitor to screen for any NSVT. She does not have any biological children to require screening - absence of symptosm reasnaoble to stay on ACEi for bp at this time, previously her diuretic was stopped - repeat echo  2. HTN - at goal, continue current meds       Arnoldo Lenis, M.D.

## 2021-09-26 ENCOUNTER — Ambulatory Visit (INDEPENDENT_AMBULATORY_CARE_PROVIDER_SITE_OTHER): Payer: 59

## 2021-09-26 DIAGNOSIS — I421 Obstructive hypertrophic cardiomyopathy: Secondary | ICD-10-CM

## 2021-09-26 LAB — ECHOCARDIOGRAM COMPLETE
AV Mean grad: 8 mmHg
AV Peak grad: 13.4 mmHg
Ao pk vel: 1.83 m/s
Area-P 1/2: 2.48 cm2
Calc EF: 60.2 %
MV M vel: 5.4 m/s
MV Peak grad: 116.5 mmHg
S' Lateral: 2.6 cm
Single Plane A2C EF: 58.7 %
Single Plane A4C EF: 60.7 %

## 2021-10-03 ENCOUNTER — Encounter: Payer: Self-pay | Admitting: *Deleted

## 2021-10-11 ENCOUNTER — Ambulatory Visit (INDEPENDENT_AMBULATORY_CARE_PROVIDER_SITE_OTHER): Payer: 59 | Admitting: Internal Medicine

## 2021-10-11 ENCOUNTER — Other Ambulatory Visit (HOSPITAL_COMMUNITY)
Admission: RE | Admit: 2021-10-11 | Discharge: 2021-10-11 | Disposition: A | Discharge status: Home / Self Care | Payer: 59 | Source: Ambulatory Visit | Attending: Nephrology | Admitting: Nephrology

## 2021-10-11 ENCOUNTER — Encounter: Payer: Self-pay | Admitting: Internal Medicine

## 2021-10-11 VITALS — BP 123/76 | HR 81 | Temp 97.8°F | Ht 62.0 in | Wt 154.0 lb

## 2021-10-11 DIAGNOSIS — R7309 Other abnormal glucose: Secondary | ICD-10-CM | POA: Diagnosis not present

## 2021-10-11 DIAGNOSIS — K219 Gastro-esophageal reflux disease without esophagitis: Secondary | ICD-10-CM

## 2021-10-11 DIAGNOSIS — N1832 Chronic kidney disease, stage 3b: Secondary | ICD-10-CM | POA: Diagnosis present

## 2021-10-11 DIAGNOSIS — K50013 Crohn's disease of small intestine with fistula: Secondary | ICD-10-CM

## 2021-10-11 LAB — RENAL FUNCTION PANEL
Albumin: 4.2 g/dL (ref 3.5–5.0)
Anion gap: 6 (ref 5–15)
BUN: 20 mg/dL (ref 6–20)
CO2: 26 mmol/L (ref 22–32)
Calcium: 9 mg/dL (ref 8.9–10.3)
Chloride: 108 mmol/L (ref 98–111)
Creatinine, Ser: 1.61 mg/dL — ABNORMAL HIGH (ref 0.44–1.00)
GFR, Estimated: 39 mL/min — ABNORMAL LOW (ref >60)
Glucose, Bld: 93 mg/dL (ref 70–99)
Phosphorus: 3.8 mg/dL (ref 2.5–4.6)
Potassium: 4.2 mmol/L (ref 3.5–5.1)
Sodium: 140 mmol/L (ref 135–145)

## 2021-10-11 LAB — HEMOGLOBIN A1C
Hgb A1c MFr Bld: 4.9 % (ref 4.8–5.6)
Mean Plasma Glucose: 93.93 mg/dL

## 2021-10-11 NOTE — Progress Notes (Signed)
Referring Provider: Cassandria Anger, MD Primary Care Physician:  Cassandria Anger, MD Primary GI:  Dr. Abbey Chatters  Chief Complaint  Patient presents with   Crohn's Disease    Follow up on Crohn's. Doing well. States no concerns today.     HPI:   [name] [name] is a 51 y.o. female who presents to the clinic today for follow up visit. 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  She established care with [place] in January 2017.  She has previously failed Imuran, Humira, Asacol.  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  She was reportedly in a research trial at [facility] for stem cell research at one point.     According to gynecology note dated 05/09/2018 noted a draining rectovaginal fistula with left buttock fluid collection.  Cultures was sent and found to be staph aureus.  [name] was held during this time which she has been maintained on since 2017.  CT pelvis in June showed evidence of rectovaginal fistula unchanged from 2018.  Patient was continued on Entyvio with CT in December 10, 2018 which showed a nodular density posterior to the rectum likely representing scarring related to prior inflammatory changes collection.  No drainable fluid identified   Patient was hospitalized in October 2020 with sepsis due to E. coli bacteremia related to UTI as well as multifocal pneumonia and aspiration.    March 2023, patient stopped her Entyvio on her own.  She states that she wanted to trial off any medications for Crohn's.  CT abdomen pelvis 07/03/2021 showed no active Crohn's disease.  No evidence of rectovaginal fistula.  Today, Notes normal bowel movements.  No melena or hematochezia.  She is concerned about her previous fistula and would like to have this reevaluated.   Procedures: EGD and colonoscopy on 10/15/2019.  Her colon looked  remarkably healthy with negative biopsies for any inflammation throughout her entire colon.  Her terminal ileum also looked healthy with negative biopsies as well.  She did have one small tubular adenoma removed. Her EGD showed a Schatzki's ring in her distal esophagus which was dilated with an 18 mm balloon.  She also had gastritis.  Upper endoscopy biopsies were relatively unremarkable besides chronic gastritis.   EGD and colonoscopy in February 2017 showed inflammation and ulceration in the cecum involving the IC valve which was unable to be intubated with colonoscope.  Also noted severe proctitis as well.  EGD showed erosive gastritis duodenitis.  Biopsies from cecal right colon with chronic active colitis and chronic inactive colitis in the rectum.  Negative for dysplasia.  Past Medical History:  Diagnosis Date   Anxiety    Arthritis    Dr. Eddie Dibbles   Bartholin cyst 2008   vag.   BIPOLAR AFFECTIVE DISORDER 04/14/2007   Crohn's ON ENTYVIO SINCE FEB [id number]   COMPLICATED BY RECTOVAGINAL FISTULA. SJS WITH REMICADE. FAILED HUMIRA.   Depression    Elevated glucose 2010   GERD (gastroesophageal reflux disease)    Hypertension    Kidney stone    LBP (low back pain)    Dr. Mina Marble   Perianal abscess 2009   Vitamin B12 deficiency     Past Surgical History:  Procedure Laterality Date   BALLOON DILATION N/A 10/15/2019   Procedure: BALLOON DILATION;  Surgeon: Eloise Harman, DO;  Location: [facility];  Service: Endoscopy;  Laterality:  N/A;   BIOPSY  10/15/2019   Procedure: BIOPSY;  Surgeon: Eloise Harman, DO;  Location: [facility];  Service: Endoscopy;;   COLONOSCOPY  09/2008   Dr. Derrill Kay: ulcers, edema, possible fistula openings all seen in the distal 3 cm of anus/anal canal. 1cm pseudopolyp. rest of colon and TI normal. rectal biopsy with mild chronic active colitis. ileum bx normal.   COLONOSCOPY WITH PROPOFOL N/A 03/29/2015   SLF: Severe proctocolitis limitied to cecum and rectum.  Limited exam of the colon mucosa and anal canal.    COLONOSCOPY WITH PROPOFOL N/A 10/15/2019   Procedure: COLONOSCOPY WITH PROPOFOL;  Surgeon: Eloise Harman, DO;  Location: [facility];  Service: Endoscopy;  Laterality: N/A;  10:45am   ELBOW SURGERY     left   ESOPHAGOGASTRODUODENOSCOPY (EGD) WITH PROPOFOL N/A 03/29/2015   SLF: 1. Erosive gastritis duodentitis.    ESOPHAGOGASTRODUODENOSCOPY (EGD) WITH PROPOFOL N/A 10/15/2019   Procedure: ESOPHAGOGASTRODUODENOSCOPY (EGD) WITH PROPOFOL;  Surgeon: Eloise Harman, DO;  Location: [facility];  Service: Endoscopy;  Laterality: N/A;   RECTOVAGINAL FISTULA CLOSURE     did not help    Current Outpatient Medications  Medication Sig Dispense Refill   albuterol (VENTOLIN HFA) 108 (90 Base) MCG/ACT inhaler Inhale 2 puffs into the lungs every 4 (four) hours as needed.     ALPRAZolam (XANAX) 1 MG tablet Take 1 tablet (1 mg total) by mouth 2 (two) times daily. 30 tablet 2   benazepril (LOTENSIN) 20 MG tablet TAKE 1 TABLET BY MOUTH EVERY DAY 90 tablet 0   BREO ELLIPTA 100-25 MCG/INH AEPB Inhale 1 puff into the lungs as needed.     Cholecalciferol (VITAMIN D3) 250 MCG (10000 UT) capsule Take 30,000 Units by mouth once a week.     cyanocobalamin (,VITAMIN B-12,) 1000 MCG/ML injection INJECT 1ML INTO THE SKIN EVERY 14 DAYS 6 mL 5   diclofenac Sodium (VOLTAREN) 1 % GEL Apply 4 g topically 4 (four) times daily. 100 g 3   HYDROcodone-acetaminophen (NORCO) 10-325 MG tablet Take 1 tablet by mouth 2 (two) times daily as needed for severe pain. 60 tablet 0   HYDROcodone-acetaminophen (NORCO) 10-325 MG tablet Take 1 tablet by mouth 2 (two) times daily as needed for severe pain. 60 tablet 0   HYDROcodone-acetaminophen (NORCO) 10-325 MG tablet Take 1 tablet by mouth 2 (two) times daily as needed for severe pain. 60 tablet 0   lamoTRIgine (LAMICTAL) 200 MG tablet Take 200 mg by mouth daily.      norethindrone (AYGESTIN) 5 MG tablet TAKE 1 TABLET (5 MG TOTAL) BY  MOUTH DAILY. 90 tablet 2   pantoprazole (PROTONIX) 40 MG tablet 1 po qam 90 tablet 3   potassium chloride (KLOR-CON) 10 MEQ tablet TAKE 1 TABLET BY MOUTH EVERY DAY 90 tablet 3   sertraline (ZOLOFT) 100 MG tablet Take 200 mg by mouth daily.     SYRINGE-NEEDLE, DISP, 3 ML (B-D SYRINGE/NEEDLE 3CC/25GX5/8) 25G X 5/8" 3 ML MISC Use to administer B12 injection every 14 days 24 each 0   ketorolac (TORADOL) 10 MG tablet Take 1 tablet (10 mg total) by mouth every 8 (eight) hours as needed for severe pain. (Patient not taking: Reported on 10/11/2021) 30 tablet 0   No current facility-administered medications for this visit.    Allergies as of 10/11/2021 - Review Complete 10/11/2021  Allergen Reaction Noted   Cephalexin Hives and Itching    Codeine Nausea Only    Guaifenesin  Imitrex [sumatriptan]  10/17/2011   Lopressor [metoprolol tartrate] Nausea Only and Other (See Comments) 06/09/2020   Morphine     Remicade [infliximab]  05/26/2015   Sulfonamide derivatives     Ultram [tramadol hcl]  05/26/2015    Family History  Problem Relation Age of Onset   Breast cancer Maternal Grandmother    Early death Maternal Grandmother 89   Cancer Maternal Grandmother        breast   Cancer Maternal Grandfather        colon   Heart attack Father    Heart disease Father    Diabetes Mother    Heart disease Mother    Heart attack Mother    Ovarian cancer Maternal Aunt    Cancer Maternal Aunt        ovarian and cervical    Social History   Socioeconomic History   Marital status: Married    Spouse name: Not on file   Number of children: Not on file   Years of education: Not on file   Highest education level: Not on file  Occupational History   Not on file  Tobacco Use   Smoking status: Every Day    Packs/day: 0.50    Years: 25.00    Total pack years: 12.50    Types: Cigarettes    Start date: 12/03/1994    Last attempt to quit: 12/21/2018    Years since quitting: 2.8   Smokeless tobacco:  Never   Tobacco comments:    Started back   Vaping Use   Vaping Use: Never used  Substance and Sexual Activity   Alcohol use: No   Drug use: No   Sexual activity: Not Currently    Partners: Male    Birth control/protection: Pill  Other Topics Concern   Not on file  Social History Narrative   Regular Exercise-no   Social Determinants of Health   Financial Resource Strain: Low Risk  (09/04/2021)   Overall Financial Resource Strain (CARDIA)    Difficulty of Paying Living Expenses: Not hard at all  Food Insecurity: No Food Insecurity (09/04/2021)   Hunger Vital Sign    Worried About Running Out of Food in the Last Year: Never true    Ran Out of Food in the Last Year: Never true  Transportation Needs: No Transportation Needs (09/04/2021)   PRAPARE - Hydrologist (Medical): No    Lack of Transportation (Non-Medical): No  Physical Activity: Insufficiently Active (09/04/2021)   Exercise Vital Sign    Days of Exercise per Week: 3 days    Minutes of Exercise per Session: 30 min  Stress: No Stress Concern Present (09/04/2021)   [place]    Feeling of Stress : Only a little  Social Connections: Socially Integrated (09/04/2021)   Social Connection and Isolation Panel [NHANES]    Frequency of Communication with Friends and Family: Three times a week    Frequency of Social Gatherings with Friends and Family: Twice a week    Attends Religious Services: More than 4 times per year    Active Member of [employer] or Organizations: Yes    Attends Music therapist: More than 4 times per year    Marital Status: Married    Subjective: Review of Systems  Constitutional:  Negative for chills and fever.  HENT:  Negative for congestion and hearing loss.   Eyes:  Negative for blurred vision and  double vision.  Respiratory:  Negative for cough and shortness of breath.   Cardiovascular:  Negative  for chest pain and palpitations.  Gastrointestinal:  Negative for abdominal pain, blood in stool, constipation, diarrhea, heartburn, melena and vomiting.  Genitourinary:  Negative for dysuria and urgency.  Musculoskeletal:  Negative for joint pain and myalgias.  Skin:  Negative for itching and rash.  Neurological:  Negative for dizziness and headaches.  Psychiatric/Behavioral:  Negative for depression. The patient is not nervous/anxious.      Objective: BP 123/76 (BP Location: Left Arm, Patient Position: Sitting, Cuff Size: Normal)   Pulse 81   Temp 97.8 F (36.6 C) (Oral)   Ht 5' 2"  (1.575 m)   Wt 154 lb (69.9 kg)   BMI 28.17 kg/m  Physical Exam Constitutional:      Appearance: Normal appearance.  HENT:     Head: Normocephalic and atraumatic.  Eyes:     Extraocular Movements: Extraocular movements intact.     Conjunctiva/sclera: Conjunctivae normal.  Cardiovascular:     Rate and Rhythm: Normal rate and regular rhythm.     Heart sounds: Murmur heard.  Pulmonary:     Effort: Pulmonary effort is normal.     Breath sounds: Normal breath sounds.  Abdominal:     General: Bowel sounds are normal.     Palpations: Abdomen is soft.  Musculoskeletal:        General: No swelling. Normal range of motion.     Cervical back: Normal range of motion and neck supple.  Skin:    General: Skin is warm and dry.     Coloration: Skin is not jaundiced.  Neurological:     General: No focal deficit present.     Mental Status: She is alert and oriented to person, place, and time.  Psychiatric:        Mood and Affect: Mood normal.        Behavior: Behavior normal.      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   Patient took it upon  herself to stop her [facility] March 2023 as she wanted to trial off any medication for Crohn's.  I discussed that by doing so we run the risk of flaring up her Crohn's disease and she understands.  I recommended that she go back on her biologic but she states she would rather not at this time.  Patient told to call office if she has any signs of Crohn's flare including diarrhea, abdominal pain, rectal bleeding.    Patient has received flu vaccine and COVID booster.  Recommend she receive pneumonia vaccine, she will discuss further with her PCP.  DEXA scan up-to-date.   Colonoscopy recall in 2-3 years pending clinical course.    Continue on PPI for chronic reflux.   Follow-up with me in 6 months or sooner if needed.  10/11/2021 9:00 AM   Disclaimer: This note was dictated with voice recognition software. Similar sounding words can inadvertently be transcribed and may not be corrected upon review.

## 2021-10-11 NOTE — Progress Notes (Addendum)
Referring Provider: Plotnikov, Evie Lacks, MD Primary Care Physician:  Cassandria Anger, MD Primary GI:  Dr. Abbey Chatters  Chief Complaint  Patient presents with   Follow-up    Pt states she is doing a lot better after stopping her crohns medication    HPI:   [name] [name] is a 51 y.o. female who presents to the clinic today for follow up visit. 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  She established care with [place] in January 2017.  She has previously failed Imuran, Humira, Asacol.  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  She was reportedly in a research trial at [facility] for stem cell research at one point.     According to gynecology note dated 05/09/2018 noted a draining rectovaginal fistula with left buttock fluid collection.  Cultures was sent and found to be staph aureus.  [name] was held during this time which she has been maintained on since 2017.  CT pelvis in June showed evidence of rectovaginal fistula unchanged from 2018.  Patient was continued on Entyvio with CT in December 10, 2018 which showed a nodular density posterior to the rectum likely representing scarring related to prior inflammatory changes collection.  No drainable fluid identified   Patient was hospitalized in October 2020 with sepsis due to E. coli bacteremia related to UTI as well as multifocal pneumonia and aspiration.    Since prior visit, patient has stopped her Entyvio.  She states that she wanted to trial off any medications for Crohn's.  Notes normal bowel movements.  No melena or hematochezia.  She is concerned about her previous fistula and would like to have this reevaluated.   Procedures: EGD and colonoscopy on 10/15/2019.  Her colon looked remarkably healthy with negative biopsies for any inflammation throughout her entire colon.  Her terminal ileum  also looked healthy with negative biopsies as well.  She did have one small tubular adenoma removed. Her EGD showed a Schatzki's ring in her distal esophagus which was dilated with an 18 mm balloon.  She also had gastritis.  Upper endoscopy biopsies were relatively unremarkable besides chronic gastritis.   EGD and colonoscopy in February 2017 showed inflammation and ulceration in the cecum involving the IC valve which was unable to be intubated with colonoscope.  Also noted severe proctitis as well.  EGD showed erosive gastritis duodenitis.  Biopsies from cecal right colon with chronic active colitis and chronic inactive colitis in the rectum.  Negative for dysplasia.  Past Medical History:  Diagnosis Date   Anxiety    Arthritis    Dr. Eddie Dibbles   Bartholin cyst 2008   vag.   BIPOLAR AFFECTIVE DISORDER 04/14/2007   Crohn's ON ENTYVIO SINCE FEB [id number]   COMPLICATED BY RECTOVAGINAL FISTULA. SJS WITH REMICADE. FAILED HUMIRA.   Depression    Elevated glucose 2010   GERD (gastroesophageal reflux disease)    Hypertension    Kidney stone    LBP (low back pain)    Dr. Mina Marble   Perianal abscess 2009   Vitamin B12 deficiency     Past Surgical History:  Procedure Laterality Date   BALLOON DILATION N/A 10/15/2019   Procedure: BALLOON DILATION;  Surgeon: Eloise Harman, DO;  [facility];  Service: Endoscopy;  Laterality: N/A;   BIOPSY  10/15/2019   Procedure: BIOPSY;  Surgeon: Eloise Harman, DO;  [facility];  Service: Endoscopy;;   COLONOSCOPY  09/2008   Dr. Derrill Kay: ulcers, edema, possible fistula openings all seen in the distal 3 cm of anus/anal canal. 1cm pseudopolyp. rest of colon and TI normal. rectal biopsy with mild chronic active colitis. ileum bx normal.   COLONOSCOPY WITH PROPOFOL N/A 03/29/2015   SLF: Severe proctocolitis limitied to cecum and rectum. Limited exam of the colon mucosa and anal canal.    COLONOSCOPY WITH PROPOFOL N/A 10/15/2019   Procedure:  COLONOSCOPY WITH PROPOFOL;  Surgeon: Eloise Harman, DO;  [facility];  Service: Endoscopy;  Laterality: N/A;  10:45am   ELBOW SURGERY     left   ESOPHAGOGASTRODUODENOSCOPY (EGD) WITH PROPOFOL N/A 03/29/2015   SLF: 1. Erosive gastritis duodentitis.    ESOPHAGOGASTRODUODENOSCOPY (EGD) WITH PROPOFOL N/A 10/15/2019   Procedure: ESOPHAGOGASTRODUODENOSCOPY (EGD) WITH PROPOFOL;  Surgeon: Eloise Harman, DO;  [facility];  Service: Endoscopy;  Laterality: N/A;   RECTOVAGINAL FISTULA CLOSURE     did not help    Current Outpatient Medications  Medication Sig Dispense Refill   ALPRAZolam (XANAX) 1 MG tablet Take 1 tablet (1 mg total) by mouth 2 (two) times daily. 30 tablet 2   BREO ELLIPTA 100-25 MCG/INH AEPB Inhale 1 puff into the lungs as needed.     Cholecalciferol (VITAMIN D3) 250 MCG (10000 UT) capsule Take 30,000 Units by mouth once a week.     cyanocobalamin (,VITAMIN B-12,) 1000 MCG/ML injection INJECT 1ML INTO THE SKIN EVERY 14 DAYS 6 mL 5   diclofenac Sodium (VOLTAREN) 1 % GEL Apply 4 g topically 4 (four) times daily. 100 g 3   ketorolac (TORADOL) 10 MG tablet Take 1 tablet (10 mg total) by mouth every 8 (eight) hours as needed for severe pain. (Patient not taking: Reported on 10/11/2021) 30 tablet 0   lamoTRIgine (LAMICTAL) 200 MG tablet Take 200 mg by mouth daily.      pantoprazole (PROTONIX) 40 MG tablet 1 po qam 90 tablet 3   sertraline (ZOLOFT) 100 MG tablet Take 200 mg by mouth daily.     SYRINGE-NEEDLE, DISP, 3 ML (B-D SYRINGE/NEEDLE 3CC/25GX5/8) 25G X 5/8" 3 ML MISC Use to administer B12 injection every 14 days 24 each 0   albuterol (VENTOLIN HFA) 108 (90 Base) MCG/ACT inhaler Inhale 2 puffs into the lungs every 4 (four) hours as needed.     benazepril (LOTENSIN) 20 MG tablet TAKE 1 TABLET BY MOUTH EVERY DAY 90 tablet 0   HYDROcodone-acetaminophen (NORCO) 10-325 MG tablet Take 1 tablet by mouth 2 (two) times daily as needed for severe pain. 60 tablet 0    HYDROcodone-acetaminophen (NORCO) 10-325 MG tablet Take 1 tablet by mouth 2 (two) times daily as needed for severe pain. 60 tablet 0   HYDROcodone-acetaminophen (NORCO) 10-325 MG tablet Take 1 tablet by mouth 2 (two) times daily as needed for severe pain. 60 tablet 0   norethindrone (AYGESTIN) 5 MG tablet TAKE 1 TABLET (5 MG TOTAL) BY MOUTH DAILY. 90 tablet 2   potassium chloride (KLOR-CON) 10 MEQ tablet TAKE 1 TABLET BY MOUTH EVERY DAY 90 tablet 3   No current facility-administered medications for this visit.    Allergies as of 05/24/2021 - Review Complete 05/24/2021  Allergen Reaction Noted   Cephalexin Hives and Itching    Codeine Nausea Only    Guaifenesin     Imitrex [sumatriptan]  10/17/2011   Lopressor [metoprolol tartrate] Nausea Only and Other (See Comments) 06/09/2020  Morphine     Remicade [infliximab]  05/26/2015   Sulfonamide derivatives     Ultram [tramadol hcl]  05/26/2015    Family History  Problem Relation Age of Onset   Breast cancer Maternal Grandmother    Early death Maternal Grandmother 51   Cancer Maternal Grandmother        breast   Cancer Maternal Grandfather        colon   Heart attack Father    Heart disease Father    Diabetes Mother    Heart disease Mother    Heart attack Mother    Ovarian cancer Maternal Aunt    Cancer Maternal Aunt        ovarian and cervical    Social History   Socioeconomic History   Marital status: Married    Spouse name: Not on file   Number of children: Not on file   Years of education: Not on file   Highest education level: Not on file  Occupational History   Not on file  Tobacco Use   Smoking status: Every Day    Packs/day: 0.50    Years: 25.00    Total pack years: 12.50    Types: Cigarettes    Start date: 12/03/1994    Last attempt to quit: 12/21/2018    Years since quitting: 2.8   Smokeless tobacco: Never   Tobacco comments:    Started back   Vaping Use   Vaping Use: Never used  Substance and Sexual  Activity   Alcohol use: No   Drug use: No   Sexual activity: Not Currently    Partners: Male    Birth control/protection: Pill  Other Topics Concern   Not on file  Social History Narrative   Regular Exercise-no   Social Determinants of Health   Financial Resource Strain: Low Risk  (09/04/2021)   Overall Financial Resource Strain (CARDIA)    Difficulty of Paying Living Expenses: Not hard at all  Food Insecurity: No Food Insecurity (09/04/2021)   Hunger Vital Sign    Worried About Running Out of Food in the Last Year: Never true    Ran Out of Food in the Last Year: Never true  Transportation Needs: No Transportation Needs (09/04/2021)   PRAPARE - Hydrologist (Medical): No    Lack of Transportation (Non-Medical): No  Physical Activity: Insufficiently Active (09/04/2021)   Exercise Vital Sign    Days of Exercise per Week: 3 days    Minutes of Exercise per Session: 30 min  Stress: No Stress Concern Present (09/04/2021)   [place]    Feeling of Stress : Only a little  Social Connections: Socially Integrated (09/04/2021)   Social Connection and Isolation Panel [NHANES]    Frequency of Communication with Friends and Family: Three times a week    Frequency of Social Gatherings with Friends and Family: Twice a week    Attends Religious Services: More than 4 times per year    Active Member of [employer] or Organizations: Yes    Attends Music therapist: More than 4 times per year    Marital Status: Married    Subjective: Review of Systems  Constitutional:  Negative for chills and fever.  HENT:  Negative for congestion and hearing loss.   Eyes:  Negative for blurred vision and double vision.  Respiratory:  Negative for cough and shortness of breath.   Cardiovascular:  Negative for  chest pain and palpitations.  Gastrointestinal:  Negative for abdominal pain, blood in stool,  constipation, diarrhea, heartburn, melena and vomiting.  Genitourinary:  Negative for dysuria and urgency.  Musculoskeletal:  Negative for joint pain and myalgias.  Skin:  Negative for itching and rash.  Neurological:  Negative for dizziness and headaches.  Psychiatric/Behavioral:  Negative for depression. The patient is not nervous/anxious.      Objective: BP 108/60   Pulse 100   Temp (!) 97.1 F (36.2 C)   Ht 5' 2"  (1.575 m)   Wt 156 lb 9.6 oz (71 kg)   LMP 03/03/2014   BMI 28.64 kg/m  Physical Exam Constitutional:      Appearance: Normal appearance.  HENT:     Head: Normocephalic and atraumatic.  Eyes:     Extraocular Movements: Extraocular movements intact.     Conjunctiva/sclera: Conjunctivae normal.  Cardiovascular:     Rate and Rhythm: Normal rate and regular rhythm.  Pulmonary:     Effort: Pulmonary effort is normal.     Breath sounds: Normal breath sounds.  Abdominal:     General: Bowel sounds are normal.     Palpations: Abdomen is soft.  Musculoskeletal:        General: No swelling. Normal range of motion.     Cervical back: Normal range of motion and neck supple.  Skin:    General: Skin is warm and dry.     Coloration: Skin is not jaundiced.  Neurological:     General: No focal deficit present.     Mental Status: She is alert and oriented to person, place, and time.  Psychiatric:        Mood and Affect: Mood normal.        Behavior: Behavior normal.      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   Patient took it upon herself to stop her [name] as she wanted to trial off any medication for Crohn's.  I discussed that by doing so we run the risk of flaring up her Crohn's disease and she  understands.  I recommended that she go back on her biologic but she states she would rather not at this time.  Patient told to call office if she has any signs of Crohn's flare including diarrhea, abdominal pain, rectal bleeding.    We will check CBC, CMP, B12, vitamin D today.  CT ordered today to evaluate Crohn's disease as well as fistula.  Patient has received flu vaccine and COVID booster.  Recommend she receive pneumonia vaccine, she will discuss further with her PCP.  DEXA scan up-to-date.   Colonoscopy recall in 2-3 years pending clinical course.    Continue on PPI for chronic reflux.   Follow-up with me in 4 months or sooner if needed.  10/11/2021 8:53 AM   Disclaimer: This note was dictated with voice recognition software. Similar sounding words can inadvertently be transcribed and may not be corrected upon review.

## 2021-10-11 NOTE — Patient Instructions (Signed)
I am happy to hear you are doing well.  We will continue to monitor your Crohn's for now.  If you have a flare then let us know.  Otherwise follow-up in 6 months.  It is always a pleasure seeing you.  Dr. Abbey Chatters

## 2021-10-12 ENCOUNTER — Encounter: Payer: Self-pay | Admitting: *Deleted

## 2021-10-15 ENCOUNTER — Other Ambulatory Visit: Payer: Self-pay | Admitting: Internal Medicine

## 2021-10-30 ENCOUNTER — Ambulatory Visit: Payer: 59 | Admitting: Internal Medicine

## 2021-11-29 ENCOUNTER — Ambulatory Visit (INDEPENDENT_AMBULATORY_CARE_PROVIDER_SITE_OTHER): Payer: 59 | Admitting: Internal Medicine

## 2021-11-29 ENCOUNTER — Encounter: Payer: Self-pay | Admitting: Internal Medicine

## 2021-11-29 VITALS — BP 120/68 | HR 85 | Temp 98.5°F | Ht 62.0 in | Wt 154.2 lb

## 2021-11-29 DIAGNOSIS — G8929 Other chronic pain: Secondary | ICD-10-CM

## 2021-11-29 DIAGNOSIS — M5440 Lumbago with sciatica, unspecified side: Secondary | ICD-10-CM | POA: Diagnosis not present

## 2021-11-29 DIAGNOSIS — F419 Anxiety disorder, unspecified: Secondary | ICD-10-CM | POA: Diagnosis not present

## 2021-11-29 DIAGNOSIS — E669 Obesity, unspecified: Secondary | ICD-10-CM

## 2021-11-29 DIAGNOSIS — E538 Deficiency of other specified B group vitamins: Secondary | ICD-10-CM

## 2021-11-29 DIAGNOSIS — Z6831 Body mass index (BMI) 31.0-31.9, adult: Secondary | ICD-10-CM

## 2021-11-29 DIAGNOSIS — K50013 Crohn's disease of small intestine with fistula: Secondary | ICD-10-CM

## 2021-11-29 MED ORDER — HYDROCODONE-ACETAMINOPHEN 10-325 MG PO TABS: 1.0000 | ORAL_TABLET | Freq: Three times a day (TID) | ORAL | 0 refills | Status: DC | PRN

## 2021-11-29 MED ORDER — HYDROCODONE-ACETAMINOPHEN 10-325 MG PO TABS: 1.0000 | ORAL_TABLET | Freq: Two times a day (BID) | ORAL | 0 refills | Status: DC | PRN

## 2021-11-29 MED ORDER — ALPRAZOLAM 1 MG PO TABS: 1.0000 mg | ORAL_TABLET | Freq: Two times a day (BID) | ORAL | 2 refills | Status: DC

## 2021-11-29 NOTE — Assessment & Plan Note (Signed)
LBP - worse (2 Norco/d is not enough). Will increase to 3/d Check UDS

## 2021-11-29 NOTE — Assessment & Plan Note (Signed)
Off Entyvio IV

## 2021-11-29 NOTE — Assessment & Plan Note (Signed)
Chronic - continue on B12 inj q 2 wks

## 2021-11-29 NOTE — Assessment & Plan Note (Signed)
Wt Readings from Last 3 Encounters:  11/29/21 154 lb 3.2 oz (69.9 kg)  10/11/21 154 lb (69.9 kg)  09/19/21 152 lb (68.9 kg)

## 2021-11-29 NOTE — Progress Notes (Signed)
Subjective:  Patient ID: PAYTYN MESTA, female    DOB: 11-Sep-1970  Age: 51 y.o. MRN: [id number]  CC: Follow-up (3 month f/u)   HPI AILEN STRAUCH presents for LBP - worse (2 Norco/d is not enough), anxiety, HAs.  Off Entyvio IV   Outpatient Medications Prior to Visit  Medication Sig Dispense Refill   ALPRAZolam (XANAX) 1 MG tablet Take 1 tablet (1 mg total) by mouth 2 (two) times daily. 30 tablet 2   benazepril (LOTENSIN) 20 MG tablet TAKE 1 TABLET BY MOUTH EVERY DAY 90 tablet 0   Cholecalciferol (VITAMIN D3) 250 MCG (10000 UT) capsule Take 30,000 Units by mouth once a week.     cyanocobalamin (,VITAMIN B-12,) 1000 MCG/ML injection INJECT 1ML INTO THE SKIN EVERY 14 DAYS 6 mL 5   diclofenac Sodium (VOLTAREN) 1 % GEL Apply 4 g topically 4 (four) times daily. 100 g 3   HYDROcodone-acetaminophen (NORCO) 10-325 MG tablet Take 1 tablet by mouth 2 (two) times daily as needed for severe pain. 60 tablet 0   HYDROcodone-acetaminophen (NORCO) 10-325 MG tablet Take 1 tablet by mouth 2 (two) times daily as needed for severe pain. 60 tablet 0   HYDROcodone-acetaminophen (NORCO) 10-325 MG tablet Take 1 tablet by mouth 2 (two) times daily as needed for severe pain. 60 tablet 0   lamoTRIgine (LAMICTAL) 200 MG tablet Take 200 mg by mouth daily.      norethindrone (AYGESTIN) 5 MG tablet TAKE 1 TABLET (5 MG TOTAL) BY MOUTH DAILY. 90 tablet 2   pantoprazole (PROTONIX) 40 MG tablet TAKE 1 TABLET BY MOUTH EVERY DAY IN THE MORNING 90 tablet 2   potassium chloride (KLOR-CON) 10 MEQ tablet TAKE 1 TABLET BY MOUTH EVERY DAY 90 tablet 3   sertraline (ZOLOFT) 100 MG tablet Take 200 mg by mouth daily.     SYRINGE-NEEDLE, DISP, 3 ML (B-D SYRINGE/NEEDLE 3CC/25GX5/8) 25G X 5/8" 3 ML MISC Use to administer B12 injection every 14 days 24 each 0   albuterol (VENTOLIN HFA) 108 (90 Base) MCG/ACT inhaler Inhale 2 puffs into the lungs every 4 (four) hours as needed.     BREO ELLIPTA 100-25 MCG/INH AEPB Inhale 1 puff into the  lungs as needed.     ketorolac (TORADOL) 10 MG tablet Take 1 tablet (10 mg total) by mouth every 8 (eight) hours as needed for severe pain. (Patient not taking: Reported on 10/11/2021) 30 tablet 0   No facility-administered medications prior to visit.    ROS: Review of Systems  Constitutional:  Positive for fatigue. Negative for activity change, appetite change, chills and unexpected weight change.  HENT:  Negative for congestion, mouth sores and sinus pressure.   Eyes:  Negative for visual disturbance.  Respiratory:  Negative for cough and chest tightness.   Gastrointestinal:  Positive for diarrhea. Negative for abdominal pain and nausea.  Genitourinary:  Negative for difficulty urinating, frequency and vaginal pain.  Musculoskeletal:  Positive for back pain. Negative for gait problem.  Skin:  Negative for pallor and rash.  Neurological:  Negative for dizziness, tremors, weakness, numbness and headaches.  Psychiatric/Behavioral:  Negative for confusion, sleep disturbance and suicidal ideas. The patient is nervous/anxious.     Objective:  BP 120/68 (BP Location: Left Arm)   Pulse 85   Temp 98.5 F (36.9 C) (Oral)   Ht 5' 2"  (1.575 m)   Wt 154 lb 3.2 oz (69.9 kg)   SpO2 97%   BMI 28.20 kg/m   BP Readings  from Last 3 Encounters:  11/29/21 120/68  10/11/21 123/76  09/19/21 116/70    Wt Readings from Last 3 Encounters:  11/29/21 154 lb 3.2 oz (69.9 kg)  10/11/21 154 lb (69.9 kg)  09/19/21 152 lb (68.9 kg)    Physical Exam Constitutional:      General: She is not in acute distress.    Appearance: She is well-developed. She is obese.  HENT:     Head: Normocephalic.     Right Ear: External ear normal.     Left Ear: External ear normal.     Nose: Nose normal.  Eyes:     General:        Right eye: No discharge.        Left eye: No discharge.     Conjunctiva/sclera: Conjunctivae normal.     Pupils: Pupils are equal, round, and reactive to light.  Neck:     Thyroid: No  thyromegaly.     Vascular: No JVD.     Trachea: No tracheal deviation.  Cardiovascular:     Rate and Rhythm: Normal rate and regular rhythm.     Heart sounds: Normal heart sounds.  Pulmonary:     Effort: No respiratory distress.     Breath sounds: No stridor. No wheezing.  Abdominal:     General: Bowel sounds are normal. There is no distension.     Palpations: Abdomen is soft. There is no mass.     Tenderness: There is no abdominal tenderness. There is no guarding or rebound.  Musculoskeletal:        General: No tenderness.     Cervical back: Normal range of motion and neck supple. No rigidity.  Lymphadenopathy:     Cervical: No cervical adenopathy.  Skin:    Findings: No erythema or rash.  Neurological:     Mental Status: She is oriented to person, place, and time.     Cranial Nerves: No cranial nerve deficit.     Motor: No abnormal muscle tone.     Coordination: Coordination normal.     Deep Tendon Reflexes: Reflexes normal.  Psychiatric:        Behavior: Behavior normal.        Thought Content: Thought content normal.        Judgment: Judgment normal.     Lab Results  Component Value Date   WBC 8.4 05/24/2021   HGB 13.3 05/24/2021   HCT 38.6 05/24/2021   PLT 131 (L) 05/24/2021   GLUCOSE 93 10/11/2021   CHOL 168 08/30/2014   TRIG 99.0 08/30/2014   HDL 27.40 (L) 08/30/2014   LDLDIRECT 171.9 05/01/2011   LDLCALC 121 (H) 08/30/2014   ALT 10 05/24/2021   AST 12 (L) 05/24/2021   NA 140 10/11/2021   K 4.2 10/11/2021   CL 108 10/11/2021   CREATININE 1.61 (H) 10/11/2021   BUN 20 10/11/2021   CO2 26 10/11/2021   TSH 2.57 07/18/2021   INR 1.1 12/05/2020   HGBA1C 4.9 10/11/2021    No results found.  Assessment & Plan:   Problem List Items Addressed This Visit     Anxiety    Chronic Cont on Xanax prn  Potential benefits of a long term benzodiazepines  use as well as potential risks  and complications were explained to the patient and were aknowledged. Do not  take w/pain meds.      B12 nutritional deficiency    Chronic - continue on B12 inj q 2 wks  Crohn's disease of ileum with fistula ([place])     Off Entyvio IV       LOW BACK PAIN, CHRONIC    LBP - worse (2 Norco/d is not enough). Will increase to 3/d Check UDS      Obesity    Wt Readings from Last 3 Encounters:  11/29/21 154 lb 3.2 oz (69.9 kg)  10/11/21 154 lb (69.9 kg)  09/19/21 152 lb (68.9 kg)           No orders of the defined types were placed in this encounter.     Follow-up: No follow-ups on file.  [name], MD

## 2021-11-29 NOTE — Assessment & Plan Note (Signed)
Chronic Cont on Xanax prn  Potential benefits of a long term benzodiazepines  use as well as potential risks  and complications were explained to the patient and were aknowledged. Do not take w/pain meds.

## 2021-12-01 LAB — PMP SCREEN PROFILE (10S), URINE
Amphetamine Scrn, Ur: NEGATIVE ng/mL
BARBITURATE SCREEN URINE: NEGATIVE ng/mL
BENZODIAZEPINE SCREEN, URINE: POSITIVE ng/mL — AB
CANNABINOIDS UR QL SCN: NEGATIVE ng/mL
Cocaine (Metab) Scrn, Ur: NEGATIVE ng/mL
Creatinine(Crt), U: 143 mg/dL (ref 20.0–300.0)
Methadone Screen, Urine: NEGATIVE ng/mL
OXYCODONE+OXYMORPHONE UR QL SCN: NEGATIVE ng/mL
Opiate Scrn, Ur: POSITIVE ng/mL — AB
Ph of Urine: 5.7 (ref 4.5–8.9)
Phencyclidine Qn, Ur: NEGATIVE ng/mL
Propoxyphene Scrn, Ur: NEGATIVE ng/mL

## 2021-12-07 ENCOUNTER — Telehealth: Payer: Self-pay

## 2021-12-07 NOTE — Telephone Encounter (Signed)
Patient needs her Hydrocodone called in to [facility] - Address: [place]  [phone]. Patient will be out on Saturday.

## 2021-12-08 ENCOUNTER — Other Ambulatory Visit (HOSPITAL_COMMUNITY): Payer: Self-pay

## 2021-12-08 MED ORDER — HYDROCODONE-ACETAMINOPHEN 10-325 MG PO TABS
ORAL_TABLET | ORAL | 0 refills | Status: DC
  Filled 2021-12-08: qty 90, 30d supply, fill #0

## 2021-12-08 NOTE — Telephone Encounter (Signed)
Spoke with PT today and she states that her husband was able to get the RX filled and does not require this request any longer.

## 2021-12-08 NOTE — Telephone Encounter (Signed)
Noted../lmb

## 2022-01-03 ENCOUNTER — Ambulatory Visit (INDEPENDENT_AMBULATORY_CARE_PROVIDER_SITE_OTHER): Payer: 59

## 2022-01-03 VITALS — Ht 62.0 in | Wt 145.0 lb

## 2022-01-03 DIAGNOSIS — Z0000 Encounter for general adult medical examination without abnormal findings: Secondary | ICD-10-CM

## 2022-01-03 NOTE — Patient Instructions (Signed)
[name] , Thank you for taking time to come for your Medicare Wellness Visit. I appreciate your ongoing commitment to your health goals. Please review the following plan we discussed and let me know if I can assist you in the future.   These are the goals we discussed:  Goals      Client understands the importance of follow-up with providers by attending scheduled visits.        This is a list of the screening recommended for you and due dates:  Health Maintenance  Topic Date Due   Flu Shot  05/20/2022*   Medicare Annual Wellness Visit  01/04/2023   Mammogram  09/05/2023   Pap Smear  09/04/2024   Tetanus Vaccine  10/31/2026   Colon Cancer Screening  10/14/2029   COVID-19 Vaccine  Completed   Hepatitis C Screening: USPSTF Recommendation to screen - Ages 18-79 yo.  Completed   HIV Screening  Completed   Zoster (Shingles) Vaccine  Completed   HPV Vaccine  Aged Out  *Topic was postponed. The date shown is not the original due date.    Advanced directives: No  Conditions/risks identified: Yes  Next appointment: Follow up in one year for your annual wellness visit.   Preventive Care 40-64 Years, Female Preventive care refers to lifestyle choices and visits with your health care provider that can promote health and wellness. What does preventive care include? A yearly physical exam. This is also called an annual well check. Dental exams once or twice a year. Routine eye exams. Ask your health care provider how often you should have your eyes checked. Personal lifestyle choices, including: Daily care of your teeth and gums. Regular physical activity. Eating a healthy diet. Avoiding tobacco and drug use. Limiting alcohol use. Practicing safe sex. Taking low-dose aspirin daily starting at age 37. Taking vitamin and mineral supplements as recommended by your health care provider. What happens during an annual well check? The services and screenings done by your health care  provider during your annual well check will depend on your age, overall health, lifestyle risk factors, and family history of disease. Counseling  Your health care provider may ask you questions about your: Alcohol use. Tobacco use. Drug use. Emotional well-being. Home and relationship well-being. Sexual activity. Eating habits. Work and work Statistician. Method of birth control. Menstrual cycle. Pregnancy history. Screening  You may have the following tests or measurements: Height, weight, and BMI. Blood pressure. Lipid and cholesterol levels. These may be checked every 5 years, or more frequently if you are over 59 years old. Skin check. Lung cancer screening. You may have this screening every year starting at age 42 if you have a 30-pack-year history of smoking and currently smoke or have quit within the past 15 years. Fecal occult blood test (FOBT) of the stool. You may have this test every year starting at age 77. Flexible sigmoidoscopy or colonoscopy. You may have a sigmoidoscopy every 5 years or a colonoscopy every 10 years starting at age 64. Hepatitis C blood test. Hepatitis B blood test. Sexually transmitted disease (STD) testing. Diabetes screening. This is done by checking your blood sugar (glucose) after you have not eaten for a while (fasting). You may have this done every 1-3 years. Mammogram. This may be done every 1-2 years. Talk to your health care provider about when you should start having regular mammograms. This may depend on whether you have a family history of breast cancer. BRCA-related cancer screening. This may be  done if you have a family history of breast, ovarian, tubal, or peritoneal cancers. Pelvic exam and Pap test. This may be done every 3 years starting at age 41. Starting at age 26, this may be done every 5 years if you have a Pap test in combination with an HPV test. Bone density scan. This is done to screen for osteoporosis. You may have this scan  if you are at high risk for osteoporosis. Discuss your test results, treatment options, and if necessary, the need for more tests with your health care provider. Vaccines  Your health care provider may recommend certain vaccines, such as: Influenza vaccine. This is recommended every year. Tetanus, diphtheria, and acellular pertussis (Tdap, Td) vaccine. You may need a Td booster every 10 years. Zoster vaccine. You may need this after age 77. Pneumococcal 13-valent conjugate (PCV13) vaccine. You may need this if you have certain conditions and were not previously vaccinated. Pneumococcal polysaccharide (PPSV23) vaccine. You may need one or two doses if you smoke cigarettes or if you have certain conditions. Talk to your health care provider about which screenings and vaccines you need and how often you need them. This information is not intended to replace advice given to you by your health care provider. Make sure you discuss any questions you have with your health care provider. Document Released: 03/04/2015 Document Revised: 10/26/2015 Document Reviewed: 12/07/2014 Elsevier Interactive Patient Education  2017 [place] Prevention in the Home Falls can cause injuries. They can happen to people of all ages. There are many things you can do to make your home safe and to help prevent falls. What can I do on the outside of my home? Regularly fix the edges of walkways and driveways and fix any cracks. Remove anything that might make you trip as you walk through a door, such as a raised step or threshold. Trim any bushes or trees on the path to your home. Use bright outdoor lighting. Clear any walking paths of anything that might make someone trip, such as rocks or tools. Regularly check to see if handrails are loose or broken. Make sure that both sides of any steps have handrails. Any raised decks and porches should have guardrails on the edges. Have any leaves, snow, or ice cleared  regularly. Use sand or salt on walking paths during winter. Clean up any spills in your garage right away. This includes oil or grease spills. What can I do in the bathroom? Use night lights. Install grab bars by the toilet and in the tub and shower. Do not use towel bars as grab bars. Use non-skid mats or decals in the tub or shower. If you need to sit down in the shower, use a plastic, non-slip stool. Keep the floor dry. Clean up any water that spills on the floor as soon as it happens. Remove soap buildup in the tub or shower regularly. Attach bath mats securely with double-sided non-slip rug tape. Do not have throw rugs and other things on the floor that can make you trip. What can I do in the bedroom? Use night lights. Make sure that you have a light by your bed that is easy to reach. Do not use any sheets or blankets that are too big for your bed. They should not hang down onto the floor. Have a firm chair that has side arms. You can use this for support while you get dressed. Do not have throw rugs and other things on  the floor that can make you trip. What can I do in the kitchen? Clean up any spills right away. Avoid walking on wet floors. Keep items that you use a lot in easy-to-reach places. If you need to reach something above you, use a strong step stool that has a grab bar. Keep electrical cords out of the way. Do not use floor polish or wax that makes floors slippery. If you must use wax, use non-skid floor wax. Do not have throw rugs and other things on the floor that can make you trip. What can I do with my stairs? Do not leave any items on the stairs. Make sure that there are handrails on both sides of the stairs and use them. Fix handrails that are broken or loose. Make sure that handrails are as long as the stairways. Check any carpeting to make sure that it is firmly attached to the stairs. Fix any carpet that is loose or worn. Avoid having throw rugs at the top or  bottom of the stairs. If you do have throw rugs, attach them to the floor with carpet tape. Make sure that you have a light switch at the top of the stairs and the bottom of the stairs. If you do not have them, ask someone to add them for you. What else can I do to help prevent falls? Wear shoes that: Do not have high heels. Have rubber bottoms. Are comfortable and fit you well. Are closed at the toe. Do not wear sandals. If you use a stepladder: Make sure that it is fully opened. Do not climb a closed stepladder. Make sure that both sides of the stepladder are locked into place. Ask someone to hold it for you, if possible. Clearly mark and make sure that you can see: Any grab bars or handrails. First and last steps. Where the edge of each step is. Use tools that help you move around (mobility aids) if they are needed. These include: Canes. Walkers. Scooters. Crutches. Turn on the lights when you go into a dark area. Replace any light bulbs as soon as they burn out. Set up your furniture so you have a clear path. Avoid moving your furniture around. If any of your floors are uneven, fix them. If there are any pets around you, be aware of where they are. Review your medicines with your doctor. Some medicines can make you feel dizzy. This can increase your chance of falling. Ask your doctor what other things that you can do to help prevent falls. This information is not intended to replace advice given to you by your health care provider. Make sure you discuss any questions you have with your health care provider. Document Released: 12/02/2008 Document Revised: 07/14/2015 Document Reviewed: 03/12/2014 Elsevier Interactive Patient Education  2017 [employer].

## 2022-01-03 NOTE — Progress Notes (Cosign Needed Addendum)
Virtual Visit via Telephone Note  I connected with  [name] [name] on 01/03/22 at 11:15 AM EST by telephone and verified that I am speaking with the correct person using two identifiers.  Location: Patient: Home Provider: [place] Persons participating in the virtual visit: [place]   I discussed the limitations, risks, security and privacy concerns of performing an evaluation and management service by telephone and the availability of in person appointments. The patient expressed understanding and agreed to proceed.  Interactive audio and video telecommunications were attempted between this nurse and patient, however failed, due to patient having technical difficulties OR patient did not have access to video capability.  We continued and completed visit with audio only.  Some vital signs may be absent or patient reported.   Sheral Flow, LPN  Subjective:   [name] [name] is a 51 y.o. female who presents for an Initial Medicare Annual Wellness Visit.  Review of Systems     Cardiac Risk Factors include: advanced age (>103mn, >>46women);family history of premature cardiovascular disease;hypertension;smoking/ tobacco exposure     Objective:    Today's Vitals   01/03/22 1117  Weight: 145 lb (65.8 kg)  Height: 5' 2"  (1.575 m)  PainSc: 0-No pain   Body mass index is 26.52 kg/m.     01/03/2022   11:19 AM 06/13/2021    8:12 AM 12/12/2020   10:20 AM 12/05/2020    8:10 AM 11/23/2019    8:06 AM 10/15/2019    7:44 AM 05/21/2019   11:51 AM  Advanced Directives  Does Patient Have a Medical Advance Directive? No No No No No No No  Would patient like information on creating a medical advance directive? No - Patient declined No - Patient declined No - Patient declined No - Patient declined No - Patient declined No - Patient declined No - Patient declined    Current Medications (verified) Outpatient Encounter Medications as of 01/03/2022  Medication  Sig   ALPRAZolam (XANAX) 1 MG tablet Take 1 tablet (1 mg total) by mouth 2 (two) times daily.   benazepril (LOTENSIN) 20 MG tablet TAKE 1 TABLET BY MOUTH EVERY DAY   Cholecalciferol (VITAMIN D3) 250 MCG (10000 UT) capsule Take 30,000 Units by mouth once a week.   cyanocobalamin (,VITAMIN B-12,) 1000 MCG/ML injection INJECT 1ML INTO THE SKIN EVERY 14 DAYS   diclofenac Sodium (VOLTAREN) 1 % GEL Apply 4 g topically 4 (four) times daily.   HYDROcodone-acetaminophen (NORCO) 10-325 MG tablet Take 1 tablet by mouth 2 (two) times daily as needed for severe pain.   HYDROcodone-acetaminophen (NORCO) 10-325 MG tablet Take 1 tablet by mouth 2 (two) times daily as needed for severe pain.   HYDROcodone-acetaminophen (NORCO) 10-325 MG tablet Take 1 tablet by mouth 3 (three) times daily as needed for severe pain.   HYDROcodone-acetaminophen (NORCO) 10-325 MG tablet Take 1 tablet by mouth 3 times daily as needed for severe pain.   lamoTRIgine (LAMICTAL) 200 MG tablet Take 200 mg by mouth daily.    norethindrone (AYGESTIN) 5 MG tablet TAKE 1 TABLET (5 MG TOTAL) BY MOUTH DAILY.   pantoprazole (PROTONIX) 40 MG tablet TAKE 1 TABLET BY MOUTH EVERY DAY IN THE MORNING   potassium chloride (KLOR-CON) 10 MEQ tablet TAKE 1 TABLET BY MOUTH EVERY DAY   sertraline (ZOLOFT) 100 MG tablet Take 200 mg by mouth daily.   SYRINGE-NEEDLE, DISP, 3 ML (B-D SYRINGE/NEEDLE 3CC/25GX5/8) 25G X 5/8" 3 ML MISC Use to administer B12 injection  every 14 days   No facility-administered encounter medications on file as of 01/03/2022.    Allergies (verified) Cephalexin, Codeine, Guaifenesin, Imitrex [sumatriptan], Lopressor [metoprolol tartrate], Morphine, Remicade [infliximab], Sulfonamide derivatives, and Ultram [tramadol hcl]   History: Past Medical History:  Diagnosis Date   Anxiety    Arthritis    Dr. Eddie Dibbles   Bartholin cyst 2008   vag.   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   Depression    Elevated glucose 2010   GERD (gastroesophageal reflux disease)    Hypertension    Kidney stone    LBP (low back pain)    Dr. Mina Marble   Perianal abscess 2009   Vitamin B12 deficiency    Past Surgical History:  Procedure Laterality Date   BALLOON DILATION N/A 10/15/2019   Procedure: BALLOON DILATION;  Surgeon: Eloise Harman, DO;  [facility];  Service: Endoscopy;  Laterality: N/A;   BIOPSY  10/15/2019   Procedure: BIOPSY;  Surgeon: Eloise Harman, DO;  [facility];  Service: Endoscopy;;   COLONOSCOPY  09/2008   Dr. Derrill Kay: ulcers, edema, possible fistula openings all seen in the distal 3 cm of anus/anal canal. 1cm pseudopolyp. rest of colon and TI normal. rectal biopsy with mild chronic active colitis. ileum bx normal.   COLONOSCOPY WITH PROPOFOL N/A 03/29/2015   SLF: Severe proctocolitis limitied to cecum and rectum. Limited exam of the colon mucosa and anal canal.    COLONOSCOPY WITH PROPOFOL N/A 10/15/2019   Procedure: COLONOSCOPY WITH PROPOFOL;  Surgeon: Eloise Harman, DO;  [facility];  Service: Endoscopy;  Laterality: N/A;  10:45am   ELBOW SURGERY     left   ESOPHAGOGASTRODUODENOSCOPY (EGD) WITH PROPOFOL N/A 03/29/2015   SLF: 1. Erosive gastritis duodentitis.    ESOPHAGOGASTRODUODENOSCOPY (EGD) WITH PROPOFOL N/A 10/15/2019   Procedure: ESOPHAGOGASTRODUODENOSCOPY (EGD) WITH PROPOFOL;  Surgeon: Eloise Harman, DO;  [facility];  Service: Endoscopy;  Laterality: N/A;   RECTOVAGINAL FISTULA CLOSURE     did not help   Family History  Problem Relation Age of Onset   Breast cancer Maternal Grandmother    Early death Maternal Grandmother 48   Cancer Maternal Grandmother        breast   Cancer Maternal Grandfather        colon   Heart attack Father    Heart disease Father    Diabetes Mother    Heart disease Mother    Heart attack Mother    Ovarian cancer  Maternal Aunt    Cancer Maternal Aunt        ovarian and cervical   Social History   Socioeconomic History   Marital status: Married    Spouse name: Not on file   Number of children: Not on file   Years of education: Not on file   Highest education level: Not on file  Occupational History   Not on file  Tobacco Use   Smoking status: Every Day    Packs/day: 0.50    Years: 25.00    Total pack years: 12.50    Types: Cigarettes    Start date: 12/03/1994    Last attempt to quit: 12/21/2018    Years since quitting: 3.0   Smokeless tobacco: Never   Tobacco comments:    Started back   Vaping Use   Vaping Use: Never used  Substance and Sexual Activity   Alcohol use: No   Drug use: No   Sexual activity: Not Currently    Partners: Male    Birth control/protection: Pill  Other Topics Concern   Not on file  Social History Narrative   Regular Exercise-no   Social Determinants of Health   Financial Resource Strain: Low Risk  (01/03/2022)   Overall Financial Resource Strain (CARDIA)    Difficulty of Paying Living Expenses: Not hard at all  Food Insecurity: No Food Insecurity (01/03/2022)   Hunger Vital Sign    Worried About Running Out of Food in the Last Year: Never true    Ran Out of Food in the Last Year: Never true  Transportation Needs: No Transportation Needs (01/03/2022)   PRAPARE - Hydrologist (Medical): No    Lack of Transportation (Non-Medical): No  Physical Activity: Sufficiently Active (01/03/2022)   Exercise Vital Sign    Days of Exercise per Week: 4 days    Minutes of Exercise per Session: 60 min  Stress: No Stress Concern Present (01/03/2022)   [place]    Feeling of Stress : Not at all  Social Connections: [place] (01/03/2022)   Social Connection and Isolation Panel [NHANES]    Frequency of Communication with Friends and Family: Three times a week     Frequency of Social Gatherings with Friends and Family: Twice a week    Attends Religious Services: More than 4 times per year    Active Member of [employer] or Organizations: Yes    Attends Music therapist: More than 4 times per year    Marital Status: Married    Tobacco Counseling Ready to quit: Not Answered Counseling given: Not Answered Tobacco comments: Started back    Clinical Intake:  Pre-visit preparation completed: Yes  Pain : No/denies pain Pain Score: 0-No pain     BMI - recorded: 26.52 Nutritional Risks: None Diabetes: No  How often do you need to have someone help you when you read instructions, pamphlets, or other written materials from your doctor or pharmacy?: 1 - Never What is the last grade level you completed in school?: HSG  Diabetic? no  Interpreter Needed?: No  Information entered by :: Lisette Abu, LPN.   Activities of Daily Living    01/03/2022   11:31 AM  In your present state of health, do you have any difficulty performing the following activities:  Hearing? 0  Vision? 0  Difficulty concentrating or making decisions? 0  Walking or climbing stairs? 0  Dressing or bathing? 0  Doing errands, shopping? 0  Preparing Food and eating ? N  Using the Toilet? N  In the past six months, have you accidently leaked urine? Y  Do you have problems with loss of bowel control? N  Managing your Medications? N  Managing your Finances? N  Housekeeping or managing your Housekeeping? N    Patient Care Team: Plotnikov, Evie Lacks, MD as PCP - [place], Alphonse Guild, MD as PCP - Cardiology (Cardiology) Maia Breslow, MD (Orthopedic Surgery) Cheryll Cockayne, MD as Referring Physician (Surgical Oncology) Daisy Lazar, MD as Referring Physician (Pulmonary Disease) Derek Jack, MD as Medical Oncologist (Hematology) Eloise Harman, DO as Consulting Physician (Internal Medicine)  Indicate any recent Medical Services you may  have received from other than [facility] providers in the past year (date may be approximate).     Assessment:  This is a routine wellness examination for Shanette.  Hearing/Vision screen Hearing Screening - Comments:: Denies hearing difficulties   Vision Screening - Comments:: Wears rx glasses - up to date with routine eye exams with [facility]   Dietary issues and exercise activities discussed: Current Exercise Habits: Home exercise routine, Type of exercise: walking;Other - see comments (cardio, aqua therapy), Time (Minutes): 60, Frequency (Times/Week): 4, Weekly Exercise (Minutes/Week): 240, Intensity: Moderate, Exercise limited by: orthopedic condition(s)   Goals Addressed             This Visit's Progress    Client understands the importance of follow-up with providers by attending scheduled visits.        Depression Screen    01/03/2022   11:28 AM 11/29/2021    8:02 AM 09/04/2021    8:28 AM 07/18/2021    7:59 AM 05/06/2020    8:37 AM 11/02/2019    7:58 AM 05/06/2019    8:02 AM  PHQ 2/9 Scores  PHQ - 2 Score 0 0 0 0 0 0 1  PHQ- 9 Score 1 1 1  2  0 7    Fall Risk    01/03/2022   11:20 AM 11/29/2021    8:02 AM 09/04/2021    8:40 AM 07/18/2021    7:58 AM 05/06/2020    8:39 AM  Fall Risk   Falls in the past year? 0 0 0 0 0  Number falls in past yr: 0 0  0   Injury with Fall? 0 0  0   Risk for fall due to : No Fall Risks No Fall Risks  No Fall Risks   Follow up Falls prevention discussed   Falls evaluation completed     FALL RISK PREVENTION PERTAINING TO THE HOME:  Any stairs in or around the home? No  If so, are there any without handrails? No  Home free of loose throw rugs in walkways, pet beds, electrical cords, etc? Yes  Adequate lighting in your home to reduce risk of falls? Yes   ASSISTIVE DEVICES UTILIZED TO PREVENT FALLS:  Life alert? No  Use of a cane, walker or w/c? No  Grab bars in the bathroom? No  Shower chair or bench in shower? No  Elevated  toilet seat or a handicapped toilet? No   TIMED UP AND GO:  Was the test performed? No . Phone Visit  Cognitive Function:        01/03/2022   11:32 AM  6CIT Screen  What Year? 0 points  What month? 0 points  What time? 0 points  Count back from 20 0 points  Months in reverse 0 points  Repeat phrase 0 points  Total Score 0 points    Immunizations Immunization History  Administered Date(s) Administered   Hepatitis A 01/27/2009   Hepatitis B 07/25/1993, 08/24/1993, 01/23/1994   Influenza Split 11/24/2010, 11/27/2011, 11/12/2016   Influenza Whole 01/24/2009, 11/15/2009   Influenza,inj,Quad PF,6+ Mos 11/03/2012, 11/30/2013, 10/29/2014, 10/03/2015, 10/04/2018, 11/07/2020   Influenza-Unspecified 09/27/2017   Moderna Covid-19 Vaccine Bivalent Booster 33yr & up 10/23/2020   Moderna Sars-Covid-2 Vaccination 04/21/2019, 05/19/2019, 11/20/2019, 05/28/2020   PPD Test 02/05/1997   Pfizer Covid-19 Vaccine Bivalent Booster 152yr& up 10/23/2020   Pneumococcal Conjugate-13 03/02/2013   Pneumococcal Polysaccharide-23 12/29/2012   Td 02/21/1998, 10/30/2016   Tdap 01/27/2009   Zoster Recombinat (Shingrix) 04/19/2020, 10/23/2020   Zoster, Live 04/29/2015    TDAP status: Up to date  Flu Vaccine status: Declined, Education has  been provided regarding the importance of this vaccine but patient still declined. Advised may receive this vaccine at local pharmacy or Health Dept. Aware to provide a copy of the vaccination record if obtained from local pharmacy or Health Dept. Verbalized acceptance and understanding.  Pneumococcal vaccine status: Up to date  Covid-19 vaccine status: Completed vaccines  Qualifies for Shingles Vaccine? Yes   Zostavax completed Yes   Shingrix Completed?: Yes  Screening Tests Health Maintenance  Topic Date Due   INFLUENZA VACCINE  05/20/2022 (Originally 09/19/2021)   Medicare Annual Wellness (AWV)  01/04/2023   MAMMOGRAM  09/05/2023   PAP SMEAR-Modifier   09/04/2024   TETANUS/TDAP  10/31/2026   COLONOSCOPY (Pts 45-10yr Insurance coverage will need to be confirmed)  10/14/2029   COVID-19 Vaccine  Completed   Hepatitis C Screening  Completed   HIV Screening  Completed   Zoster Vaccines- Shingrix  Completed   HPV VACCINES  Aged Out    Health Maintenance  There are no preventive care reminders to display for this patient.   Colorectal cancer screening: Type of screening: Colonoscopy. Completed 10/15/2019. Repeat every 10 years  Mammogram status: Completed 09/04/2021. Repeat every year  Bone Density status: Completed 11/16/2020. Results reflect: Bone density results: NORMAL. Repeat every 5 years.  Lung Cancer Screening: (Low Dose CT Chest recommended if Age 52-80years, 30 pack-year currently smoking OR have quit w/in 15years.) does not qualify.   Lung Cancer Screening Referral: no  Additional Screening:  Hepatitis C Screening: does qualify; Completed 04/27/2019  Vision Screening: Recommended annual ophthalmology exams for early detection of glaucoma and other disorders of the eye. Is the patient up to date with their annual eye exam?  Yes  Who is the provider or what is the name of the [facility] which the patient [facility]ends [facility] exams[facility] a provider, would they like to be referred to a provider to establish care? No .   Dental Screening: Recommended annual dental exams for proper oral hygiene  Community Resource Referral / Chronic Care Management: CRR required this visit?  No   CCM required this visit?  No      Plan:     I have personally reviewed and noted the following in the patient's chart:   Medical and social history Use of alcohol, tobacco or illicit drugs  Current medications and supplements including opioid prescriptions. Patient is currently taking opioid prescriptions. Information provided to patient regarding non-opioid alternatives. Patient advised to discuss  non-opioid treatment plan with their provider. Functional ability and status Nutritional status Physical activity Advanced directives List of other physicians Hospitalizations, surgeries, and ER visits in previous 12 months Vitals Screenings to include cognitive, depression, and falls Referrals and appointments  In addition, I have reviewed and discussed with patient certain preventive protocols, quality metrics, and best practice recommendations. A written personalized care plan for preventive services as well as general preventive health recommendations were provided to patient.     SSheral Flow LPN   [id number]  Nurse Notes: N/A   Medical screening examination/treatment/procedure(s) were performed by non-physician practitioner and as supervising physician I was immediately available for consultation/collaboration.  I agree with above. ALew Dawes MD

## 2022-01-08 ENCOUNTER — Other Ambulatory Visit (HOSPITAL_COMMUNITY): Payer: Self-pay

## 2022-01-08 MED ORDER — HYDROCODONE-ACETAMINOPHEN 10-325 MG PO TABS
1.0000 | ORAL_TABLET | Freq: Two times a day (BID) | ORAL | 0 refills | Status: DC | PRN
  Filled 2022-01-08: qty 90, 45d supply, fill #0

## 2022-01-08 MED ORDER — HYDROCODONE-ACETAMINOPHEN 10-325 MG PO TABS: 1.0000 | ORAL_TABLET | Freq: Two times a day (BID) | ORAL | 0 refills | Status: DC | PRN

## 2022-01-25 ENCOUNTER — Other Ambulatory Visit: Payer: Self-pay | Admitting: Cardiology

## 2022-02-03 ENCOUNTER — Other Ambulatory Visit (HOSPITAL_BASED_OUTPATIENT_CLINIC_OR_DEPARTMENT_OTHER): Payer: Self-pay

## 2022-02-05 ENCOUNTER — Telehealth: Payer: Self-pay | Admitting: Internal Medicine

## 2022-02-05 ENCOUNTER — Other Ambulatory Visit (HOSPITAL_COMMUNITY): Payer: Self-pay

## 2022-02-05 ENCOUNTER — Encounter: Payer: Self-pay | Admitting: Internal Medicine

## 2022-02-05 NOTE — Telephone Encounter (Signed)
Caller & Relationship to patient: Self  Call back number: [phone]   Date of last office visit: 11-29-21  Date of next office visit: 1.16.23  Medication(s) to be refilled:  HYDROcodone-acetaminophen (NORCO) 10-325 MG tablet 11/29/21  Sig - Route: Take 1 tablet by mouth 3 (three) times daily as needed for severe pain. - Oral    Preferred Pharmacy:  [facility]

## 2022-02-06 ENCOUNTER — Encounter: Payer: Self-pay | Admitting: Internal Medicine

## 2022-02-07 ENCOUNTER — Encounter: Payer: Self-pay | Admitting: Internal Medicine

## 2022-02-07 NOTE — Telephone Encounter (Signed)
There is a prescription on file to be filled on 02/02/2022.  Thanks

## 2022-02-07 NOTE — Telephone Encounter (Signed)
Called pt w/ MD response. She states she takes the Hydrocodone three times a day. MD sent 2 scripts in that's says BID. And that's not right. She states she is out of medicine.Marland KitchenJohny Chess

## 2022-02-08 ENCOUNTER — Other Ambulatory Visit (HOSPITAL_COMMUNITY): Payer: Self-pay

## 2022-02-08 MED ORDER — HYDROCODONE-ACETAMINOPHEN 10-325 MG PO TABS: 1.0000 | ORAL_TABLET | Freq: Three times a day (TID) | ORAL | 0 refills | Status: DC | PRN

## 2022-02-08 MED ORDER — HYDROCODONE-ACETAMINOPHEN 10-325 MG PO TABS
1.0000 | ORAL_TABLET | Freq: Three times a day (TID) | ORAL | 0 refills | Status: DC | PRN
  Filled 2022-02-08: qty 90, 30d supply, fill #0

## 2022-02-08 NOTE — Telephone Encounter (Signed)
Ok thx.

## 2022-02-08 NOTE — Telephone Encounter (Signed)
Notified pt MD resent rx to [place].Marland KitchenJohny Chess

## 2022-02-08 NOTE — Telephone Encounter (Signed)
Pt called back MD sent rx to wrong pharmacy must be sent to Preferred Pharmacy: [facility]

## 2022-02-08 NOTE — Telephone Encounter (Signed)
Okay.  Corrected.  Thanks

## 2022-02-09 ENCOUNTER — Other Ambulatory Visit (HOSPITAL_COMMUNITY): Payer: Self-pay

## 2022-03-06 ENCOUNTER — Other Ambulatory Visit (HOSPITAL_COMMUNITY): Payer: Self-pay

## 2022-03-06 ENCOUNTER — Encounter: Payer: Self-pay | Admitting: Internal Medicine

## 2022-03-06 ENCOUNTER — Telehealth (INDEPENDENT_AMBULATORY_CARE_PROVIDER_SITE_OTHER): Payer: 59 | Admitting: Internal Medicine

## 2022-03-06 DIAGNOSIS — E538 Deficiency of other specified B group vitamins: Secondary | ICD-10-CM

## 2022-03-06 DIAGNOSIS — N183 Chronic kidney disease, stage 3 unspecified: Secondary | ICD-10-CM | POA: Diagnosis not present

## 2022-03-06 DIAGNOSIS — I10 Essential (primary) hypertension: Secondary | ICD-10-CM | POA: Diagnosis not present

## 2022-03-06 DIAGNOSIS — K50013 Crohn's disease of small intestine with fistula: Secondary | ICD-10-CM

## 2022-03-06 DIAGNOSIS — E559 Vitamin D deficiency, unspecified: Secondary | ICD-10-CM | POA: Diagnosis not present

## 2022-03-06 DIAGNOSIS — M5440 Lumbago with sciatica, unspecified side: Secondary | ICD-10-CM

## 2022-03-06 DIAGNOSIS — G8929 Other chronic pain: Secondary | ICD-10-CM

## 2022-03-06 MED ORDER — HYDROCODONE-ACETAMINOPHEN 10-325 MG PO TABS
1.0000 | ORAL_TABLET | Freq: Three times a day (TID) | ORAL | 0 refills | Status: DC | PRN
  Filled 2022-03-06 – 2022-04-13 (×2): qty 90, 30d supply, fill #0

## 2022-03-06 MED ORDER — HYDROCODONE-ACETAMINOPHEN 10-325 MG PO TABS
1.0000 | ORAL_TABLET | Freq: Three times a day (TID) | ORAL | 0 refills | Status: DC | PRN
  Filled 2022-03-06: qty 90, 30d supply, fill #0

## 2022-03-06 MED ORDER — CYANOCOBALAMIN 1000 MCG/ML IJ SOLN: INTRAMUSCULAR | 5 refills | Status: DC

## 2022-03-06 MED ORDER — HYDROCODONE-ACETAMINOPHEN 10-325 MG PO TABS
1.0000 | ORAL_TABLET | Freq: Three times a day (TID) | ORAL | 0 refills | Status: DC | PRN
  Filled 2022-03-06 – 2022-05-11 (×2): qty 90, 30d supply, fill #0

## 2022-03-06 NOTE — Assessment & Plan Note (Signed)
CBC F/u w/GI on 1/31 Off Rx

## 2022-03-06 NOTE — Assessment & Plan Note (Signed)
Check Vit D

## 2022-03-06 NOTE — Assessment & Plan Note (Signed)
F/u w/Nephrology SBP 130

## 2022-03-06 NOTE — Progress Notes (Signed)
Virtual Visit via Video Note  I connected with [name] [name] on 03/06/22 at  7:50 AM EST by a video enabled telemedicine application and verified that I am speaking with the correct person using two identifiers.   I discussed the limitations of evaluation and management by telemedicine and the availability of in person appointments. The patient expressed understanding and agreed to proceed.  I was located at our [facility] office. The patient was at home. There was no one else present in the visit.  No chief complaint on file.    History of Present Illness: F/u on LBP, Vit D def, CRI  Review of Systems  Constitutional:  Negative for diaphoresis and fever.  Respiratory:  Negative for cough.   Cardiovascular:  Negative for leg swelling.  Gastrointestinal:  Negative for diarrhea.  Musculoskeletal:  Positive for back pain and joint pain.  Neurological:  Negative for weakness and headaches.     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)    CBC F/u w/GI on 1/31 Off Rx      Relevant Orders   TSH   CBC with Differential/Platelet   Comprehensive metabolic panel   PTH, intact and calcium   VITAMIN D 25 Hydroxy (Vit-D Deficiency, Fractures)     Genitourinary   CRI (chronic renal insufficiency), stage 3 (moderate) (HCC)    CMET      Relevant Orders   TSH   CBC with Differential/Platelet   Comprehensive metabolic panel   PTH, intact and calcium   VITAMIN D 25 Hydroxy (Vit-D Deficiency, Fractures)     Other   Vitamin D deficiency - Primary    Check Vit D      Relevant Orders   TSH   Comprehensive metabolic panel   PTH, intact and calcium   VITAMIN D 25 Hydroxy (Vit-D Deficiency, Fractures)   CBC with  Differential/Platelet   VITAMIN D 25 Hydroxy (Vit-D Deficiency, Fractures)   LOW BACK PAIN, CHRONIC    Norco prn.  On disabiity  Norco prn. Potential benefits of a long term opioids use as well as potential risks (i.e. addiction risk, apnea etc) and complications (i.e. Somnolence, constipation and others) were explained to the patient and were aknowledged.      Relevant Medications   HYDROcodone-acetaminophen (NORCO) 10-325 MG tablet   HYDROcodone-acetaminophen (NORCO) 10-325 MG tablet   HYDROcodone-acetaminophen (NORCO) 10-325 MG tablet   B12 nutritional deficiency    On B12      Relevant Orders   TSH   CBC with Differential/Platelet   Comprehensive metabolic panel   PTH, intact and calcium   VITAMIN D 25 Hydroxy (Vit-D Deficiency, Fractures)   NAD  Meds ordered this encounter  Medications   cyanocobalamin (VITAMIN B12) 1000 MCG/ML injection    Sig: INJECT 1ML INTO THE SKIN EVERY 14 DAYS    Dispense:  6 mL    Refill:  5   HYDROcodone-acetaminophen (NORCO) 10-325 MG tablet    Sig: Take 1 tablet by mouth 3 (three) times daily as needed for severe pain.    Dispense:  90 tablet    Refill:  0    Please fill on or after 03/09/22   HYDROcodone-acetaminophen ([place])  10-325 MG tablet    Sig: Take 1 tablet by mouth 3 (three) times daily as needed.    Dispense:  90 tablet    Refill:  0    Please fill on or after 04/08/22   HYDROcodone-acetaminophen (NORCO) 10-325 MG tablet    Sig: Take 1 tablet by mouth 3 (three) times daily as needed for severe pain.    Dispense:  90 tablet    Refill:  0    Please fill on or after 04/09/22     Follow Up Instructions:    I discussed the assessment and treatment plan with the patient. The patient was provided an opportunity to ask questions and all were answered. The patient agreed with the plan and demonstrated an understanding of the instructions.   The patient was advised to call back or seek an in-person evaluation if the symptoms worsen or  if the condition fails to improve as anticipated.  I provided face-to-face time during this encounter. We were at different locations.   [name], MD

## 2022-03-06 NOTE — Assessment & Plan Note (Signed)
Norco prn.  On disabiity  Norco prn. Potential benefits of a long term opioids use as well as potential risks (i.e. addiction risk, apnea etc) and complications (i.e. Somnolence, constipation and others) were explained to the patient and were aknowledged.

## 2022-03-06 NOTE — Assessment & Plan Note (Signed)
CMET

## 2022-03-06 NOTE — Assessment & Plan Note (Signed)
On B12 

## 2022-03-09 ENCOUNTER — Other Ambulatory Visit (HOSPITAL_COMMUNITY): Payer: Self-pay

## 2022-03-09 ENCOUNTER — Other Ambulatory Visit (HOSPITAL_COMMUNITY)
Admission: RE | Admit: 2022-03-09 | Discharge: 2022-03-09 | Disposition: A | Discharge status: Home / Self Care | Payer: 59 | Source: Ambulatory Visit | Attending: Nephrology | Admitting: Nephrology

## 2022-03-09 ENCOUNTER — Other Ambulatory Visit (HOSPITAL_COMMUNITY)
Admission: RE | Admit: 2022-03-09 | Discharge: 2022-03-09 | Disposition: A | Discharge status: Home / Self Care | Payer: 59 | Source: Ambulatory Visit | Attending: Internal Medicine | Admitting: Internal Medicine

## 2022-03-09 DIAGNOSIS — E559 Vitamin D deficiency, unspecified: Secondary | ICD-10-CM | POA: Diagnosis present

## 2022-03-09 DIAGNOSIS — K50013 Crohn's disease of small intestine with fistula: Secondary | ICD-10-CM

## 2022-03-09 DIAGNOSIS — N19 Unspecified kidney failure: Secondary | ICD-10-CM | POA: Insufficient documentation

## 2022-03-09 DIAGNOSIS — E538 Deficiency of other specified B group vitamins: Secondary | ICD-10-CM

## 2022-03-09 DIAGNOSIS — N183 Chronic kidney disease, stage 3 unspecified: Secondary | ICD-10-CM

## 2022-03-09 LAB — CBC WITH DIFFERENTIAL/PLATELET
Abs Immature Granulocytes: 0.03 10*3/uL (ref 0.00–0.07)
Basophils Absolute: 0 10*3/uL (ref 0.0–0.1)
Basophils Relative: 0 %
Eosinophils Absolute: 0.1 10*3/uL (ref 0.0–0.5)
Eosinophils Relative: 1 %
HCT: 45.7 % (ref 36.0–46.0)
Hemoglobin: 15.8 g/dL — ABNORMAL HIGH (ref 12.0–15.0)
Immature Granulocytes: 0 %
Lymphocytes Relative: 19 %
Lymphs Abs: 1.5 10*3/uL (ref 0.7–4.0)
MCH: 31.3 pg (ref 26.0–34.0)
MCHC: 34.6 g/dL (ref 30.0–36.0)
MCV: 90.7 fL (ref 80.0–100.0)
Monocytes Absolute: 0.4 10*3/uL (ref 0.1–1.0)
Monocytes Relative: 5 %
Neutro Abs: 5.9 10*3/uL (ref 1.7–7.7)
Neutrophils Relative %: 75 %
Platelets: 145 10*3/uL — ABNORMAL LOW (ref 150–400)
RBC: 5.04 MIL/uL (ref 3.87–5.11)
RDW: 13.4 % (ref 11.5–15.5)
WBC: 7.9 10*3/uL (ref 4.0–10.5)
nRBC: 0 % (ref 0.0–0.2)

## 2022-03-09 LAB — COMPREHENSIVE METABOLIC PANEL
ALT: 12 U/L (ref 0–44)
AST: 13 U/L — ABNORMAL LOW (ref 15–41)
Albumin: 4.6 g/dL (ref 3.5–5.0)
Alkaline Phosphatase: 68 U/L (ref 38–126)
Anion gap: 10 (ref 5–15)
BUN: 16 mg/dL (ref 6–20)
CO2: 25 mmol/L (ref 22–32)
Calcium: 9.6 mg/dL (ref 8.9–10.3)
Chloride: 105 mmol/L (ref 98–111)
Creatinine, Ser: 1.54 mg/dL — ABNORMAL HIGH (ref 0.44–1.00)
GFR, Estimated: 40 mL/min — ABNORMAL LOW (ref >60)
Glucose, Bld: 100 mg/dL — ABNORMAL HIGH (ref 70–99)
Potassium: 4.2 mmol/L (ref 3.5–5.1)
Sodium: 140 mmol/L (ref 135–145)
Total Bilirubin: 1 mg/dL (ref 0.3–1.2)
Total Protein: 7.8 g/dL (ref 6.5–8.1)

## 2022-03-09 LAB — VITAMIN D 25 HYDROXY (VIT D DEFICIENCY, FRACTURES): Vit D, 25-Hydroxy: 77.04 ng/mL (ref 30–100)

## 2022-03-09 LAB — RENAL FUNCTION PANEL
Albumin: 4.7 g/dL (ref 3.5–5.0)
Anion gap: 11 (ref 5–15)
BUN: 16 mg/dL (ref 6–20)
CO2: 25 mmol/L (ref 22–32)
Calcium: 9.5 mg/dL (ref 8.9–10.3)
Chloride: 104 mmol/L (ref 98–111)
Creatinine, Ser: 1.58 mg/dL — ABNORMAL HIGH (ref 0.44–1.00)
GFR, Estimated: 39 mL/min — ABNORMAL LOW (ref >60)
Glucose, Bld: 100 mg/dL — ABNORMAL HIGH (ref 70–99)
Phosphorus: 3.4 mg/dL (ref 2.5–4.6)
Potassium: 4.1 mmol/L (ref 3.5–5.1)
Sodium: 140 mmol/L (ref 135–145)

## 2022-03-09 LAB — TSH: TSH: 1.901 u[IU]/mL (ref 0.350–4.500)

## 2022-03-10 ENCOUNTER — Other Ambulatory Visit (HOSPITAL_COMMUNITY): Payer: Self-pay

## 2022-03-10 LAB — PTH, INTACT AND CALCIUM
Calcium, Total (PTH): 9.8 mg/dL (ref 8.7–10.2)
PTH: 25 pg/mL (ref 15–65)

## 2022-03-13 ENCOUNTER — Other Ambulatory Visit (HOSPITAL_COMMUNITY): Payer: Self-pay

## 2022-03-14 ENCOUNTER — Other Ambulatory Visit: Payer: Self-pay

## 2022-03-14 ENCOUNTER — Other Ambulatory Visit (HOSPITAL_COMMUNITY): Payer: Self-pay

## 2022-03-15 ENCOUNTER — Other Ambulatory Visit (HOSPITAL_COMMUNITY): Payer: Self-pay

## 2022-03-21 ENCOUNTER — Ambulatory Visit (INDEPENDENT_AMBULATORY_CARE_PROVIDER_SITE_OTHER): Payer: 59 | Admitting: Internal Medicine

## 2022-03-21 ENCOUNTER — Other Ambulatory Visit (HOSPITAL_COMMUNITY)
Admission: RE | Admit: 2022-03-21 | Discharge: 2022-03-21 | Disposition: A | Discharge status: Home / Self Care | Payer: 59 | Source: Ambulatory Visit | Attending: Internal Medicine | Admitting: Internal Medicine

## 2022-03-21 ENCOUNTER — Encounter: Payer: Self-pay | Admitting: Internal Medicine

## 2022-03-21 VITALS — BP 127/83 | HR 103 | Temp 97.6°F | Ht 62.0 in | Wt 151.6 lb

## 2022-03-21 DIAGNOSIS — K219 Gastro-esophageal reflux disease without esophagitis: Secondary | ICD-10-CM

## 2022-03-21 DIAGNOSIS — K50013 Crohn's disease of small intestine with fistula: Secondary | ICD-10-CM

## 2022-03-21 LAB — C-REACTIVE PROTEIN: CRP: 1.1 mg/dL — ABNORMAL HIGH (ref <1.0)

## 2022-03-21 LAB — VITAMIN B12: Vitamin B-12: 624 pg/mL (ref 180–914)

## 2022-03-21 LAB — SEDIMENTATION RATE: Sed Rate: 9 mm/hr (ref 0–22)

## 2022-03-21 NOTE — Progress Notes (Signed)
Referring Provider: Cassandria Anger, MD Primary Care Physician:  Cassandria Anger, MD Primary GI:  Dr. Abbey Chatters  Chief Complaint  Patient presents with   Crohn's Disease    Follow up on Crohn's disease and reflux. Takes pantoprazole for reflux and doing well. Has been off entyvio for crohn's for about one year and doing well.     HPI:   [name] [name] is a 52 y.o. female who presents to the clinic today for follow up visit. 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  She established care with [place] in January 2017.  She has previously failed Imuran, Humira, Asacol.  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  She was reportedly in a research trial at [facility] for stem cell research at one point.     According to gynecology note dated 05/09/2018 noted a draining rectovaginal fistula with left buttock fluid collection.  Cultures were sent and found to be staph aureus.  [name] was held during this time which she has been maintained on since 2017.  CT pelvis in June showed evidence of rectovaginal fistula unchanged from 2018.  Patient was continued on Entyvio with CT in December 10, 2018 which showed a nodular density posterior to the rectum likely representing scarring related to prior inflammatory changes collection.  No drainable fluid identified   Patient was hospitalized in October 2020 with sepsis due to E. coli bacteremia related to UTI as well as multifocal pneumonia and aspiration.  March 2023, patient stopped her Entyvio on her own.  She states that she wanted to trial off any medications for Crohn's.  CT abdomen pelvis 07/03/2021 showed no active Crohn's disease.  No evidence of rectovaginal fistula.  Today, Notes normal bowel movements.  No melena or hematochezia.     Procedures: EGD and colonoscopy on 10/15/2019.  Her colon  looked remarkably healthy with negative biopsies for any inflammation throughout her entire colon.  Her terminal ileum also looked healthy with negative biopsies as well.  She did have one small tubular adenoma removed. Her EGD showed a Schatzki's ring in her distal esophagus which was dilated with an 18 mm balloon.  She also had gastritis.  Upper endoscopy biopsies were relatively unremarkable besides chronic gastritis.   EGD and colonoscopy in February 2017 showed inflammation and ulceration in the cecum involving the IC valve which was unable to be intubated with colonoscope.  Also noted severe proctitis as well.  EGD showed erosive gastritis duodenitis.  Biopsies from cecal right colon with chronic active colitis and chronic inactive colitis in the rectum.  Negative for dysplasia.  Past Medical History:  Diagnosis Date   Anxiety    Arthritis    Dr. Eddie Dibbles   Bartholin cyst 2008   vag.   BIPOLAR AFFECTIVE DISORDER 04/14/2007   Crohn's ON ENTYVIO SINCE FEB [id number]   COMPLICATED BY RECTOVAGINAL FISTULA. SJS WITH REMICADE. FAILED HUMIRA.   Depression    Elevated glucose 2010   GERD (gastroesophageal reflux disease)    Hypertension    Kidney stone    LBP (low back pain)    Dr. Mina Marble   Perianal abscess 2009   Vitamin B12 deficiency     Past Surgical History:  Procedure Laterality Date   BALLOON DILATION N/A 10/15/2019   Procedure: BALLOON DILATION;  Surgeon: Eloise Harman, DO;  [facility];  Service: Endoscopy;  Laterality: N/A;   BIOPSY  10/15/2019   Procedure: BIOPSY;  Surgeon: Eloise Harman, DO;  [facility];  Service: Endoscopy;;   COLONOSCOPY  09/2008   Dr. Derrill Kay: ulcers, edema, possible fistula openings all seen in the distal 3 cm of anus/anal canal. 1cm pseudopolyp. rest of colon and TI normal. rectal biopsy with mild chronic active colitis. ileum bx normal.   COLONOSCOPY WITH PROPOFOL N/A 03/29/2015   SLF: Severe proctocolitis limitied to cecum and  rectum. Limited exam of the colon mucosa and anal canal.    COLONOSCOPY WITH PROPOFOL N/A 10/15/2019   Procedure: COLONOSCOPY WITH PROPOFOL;  Surgeon: Eloise Harman, DO;  [facility];  Service: Endoscopy;  Laterality: N/A;  10:45am   ELBOW SURGERY     left   ESOPHAGOGASTRODUODENOSCOPY (EGD) WITH PROPOFOL N/A 03/29/2015   SLF: 1. Erosive gastritis duodentitis.    ESOPHAGOGASTRODUODENOSCOPY (EGD) WITH PROPOFOL N/A 10/15/2019   Procedure: ESOPHAGOGASTRODUODENOSCOPY (EGD) WITH PROPOFOL;  Surgeon: Eloise Harman, DO;  [facility];  Service: Endoscopy;  Laterality: N/A;   RECTOVAGINAL FISTULA CLOSURE     did not help    Current Outpatient Medications  Medication Sig Dispense Refill   ALPRAZolam (XANAX) 1 MG tablet Take 1 tablet (1 mg total) by mouth 2 (two) times daily. 30 tablet 2   benazepril (LOTENSIN) 20 MG tablet TAKE 1 TABLET BY MOUTH EVERY DAY 90 tablet 3   Cholecalciferol (VITAMIN D3) 250 MCG (10000 UT) capsule Take 30,000 Units by mouth once a week.     cyanocobalamin (VITAMIN B12) 1000 MCG/ML injection INJECT 1ML INTO THE SKIN EVERY 14 DAYS 6 mL 5   diclofenac Sodium (VOLTAREN) 1 % GEL Apply 4 g topically 4 (four) times daily. 100 g 3   HYDROcodone-acetaminophen (NORCO) 10-325 MG tablet Take 1 tablet by mouth 3 times daily as needed for severe pain. 90 tablet 0   HYDROcodone-acetaminophen (NORCO) 10-325 MG tablet Take 1 tablet by mouth 3 (three) times daily as needed for severe pain. 03/09/22 90 tablet 0   HYDROcodone-acetaminophen (NORCO) 10-325 MG tablet Take 1 tablet by mouth 3 (three) times daily as needed. 90 tablet 0   HYDROcodone-acetaminophen (NORCO) 10-325 MG tablet Take 1 tablet by mouth 3 (three) times daily as needed for severe pain. 04/09/22 90 tablet 0   lamoTRIgine (LAMICTAL) 200 MG tablet Take 200 mg by mouth daily.      norethindrone (AYGESTIN) 5 MG tablet TAKE 1 TABLET (5 MG TOTAL) BY MOUTH DAILY. 90 tablet 2   pantoprazole (PROTONIX) 40 MG  tablet TAKE 1 TABLET BY MOUTH EVERY DAY IN THE MORNING 90 tablet 2   potassium chloride (KLOR-CON) 10 MEQ tablet TAKE 1 TABLET BY MOUTH EVERY DAY 90 tablet 3   sertraline (ZOLOFT) 100 MG tablet Take 200 mg by mouth daily.     SYRINGE-NEEDLE, DISP, 3 ML (B-D SYRINGE/NEEDLE 3CC/25GX5/8) 25G X 5/8" 3 ML MISC Use to administer B12 injection every 14 days 24 each 0   No current facility-administered medications for this visit.    Allergies as of 03/21/2022 - Review Complete 03/21/2022  Allergen Reaction Noted   Cephalexin Hives and Itching    Codeine Nausea Only    Guaifenesin     Imitrex [sumatriptan]  10/17/2011   Lopressor [metoprolol tartrate] Nausea Only and Other (See Comments) 06/09/2020   Morphine     Remicade [infliximab]  05/26/2015   Sulfonamide derivatives     Ultram [tramadol hcl]  05/26/2015  Family History  Problem Relation Age of Onset   Breast cancer Maternal Grandmother    Early death Maternal Grandmother 42   Cancer Maternal Grandmother        breast   Cancer Maternal Grandfather        colon   Heart attack Father    Heart disease Father    Diabetes Mother    Heart disease Mother    Heart attack Mother    Ovarian cancer Maternal Aunt    Cancer Maternal Aunt        ovarian and cervical    Social History   Socioeconomic History   Marital status: Married    Spouse name: Not on file   Number of children: Not on file   Years of education: Not on file   Highest education level: Not on file  Occupational History   Not on file  Tobacco Use   Smoking status: Every Day    Packs/day: 0.50    Years: 25.00    Total pack years: 12.50    Types: Cigarettes    Start date: 12/03/1994    Last attempt to quit: 12/21/2018    Years since quitting: 3.2   Smokeless tobacco: Never   Tobacco comments:    Started back   Vaping Use   Vaping Use: Never used  Substance and Sexual Activity   Alcohol use: No   Drug use: No   Sexual activity: Not Currently     Partners: Male    Birth control/protection: Pill  Other Topics Concern   Not on file  Social History Narrative   Regular Exercise-no   Social Determinants of Health   Financial Resource Strain: Low Risk  (01/03/2022)   Overall Financial Resource Strain (CARDIA)    Difficulty of Paying Living Expenses: Not hard at all  Food Insecurity: No Food Insecurity (01/03/2022)   Hunger Vital Sign    Worried About Running Out of Food in the Last Year: Never true    Ran Out of Food in the Last Year: Never true  Transportation Needs: No Transportation Needs (01/03/2022)   PRAPARE - Hydrologist (Medical): No    Lack of Transportation (Non-Medical): No  Physical Activity: Sufficiently Active (01/03/2022)   Exercise Vital Sign    Days of Exercise per Week: 4 days    Minutes of Exercise per Session: 60 min  Stress: No Stress Concern Present (01/03/2022)   [place]    Feeling of Stress : Not at all  Social Connections: [place] (01/03/2022)   Social Connection and Isolation Panel [NHANES]    Frequency of Communication with Friends and Family: Three times a week    Frequency of Social Gatherings with Friends and Family: Twice a week    Attends Religious Services: More than 4 times per year    Active Member of [employer] or Organizations: Yes    Attends Music therapist: More than 4 times per year    Marital Status: Married    Subjective: Review of Systems  Constitutional:  Negative for chills and fever.  HENT:  Negative for congestion and hearing loss.   Eyes:  Negative for blurred vision and double vision.  Respiratory:  Negative for cough and shortness of breath.   Cardiovascular:  Negative for chest pain and palpitations.  Gastrointestinal:  Negative for abdominal pain, blood in stool, constipation, diarrhea, heartburn, melena and vomiting.  Genitourinary:  Negative for  dysuria and urgency.  Musculoskeletal:  Negative for joint pain and myalgias.  Skin:  Negative for itching and rash.  Neurological:  Negative for dizziness and headaches.  Psychiatric/Behavioral:  Negative for depression. The patient is not nervous/anxious.      Objective: BP 127/83 (BP Location: Left Arm, Patient Position: Sitting, Cuff Size: Normal)   Pulse (!) 103   Temp 97.6 F (36.4 C) (Oral)   Ht '5\' 2"'$  (1.575 m)   Wt 151 lb 9.6 oz (68.8 kg)   BMI 27.73 kg/m  Physical Exam Constitutional:      Appearance: Normal appearance.  HENT:     Head: Normocephalic and atraumatic.  Eyes:     Extraocular Movements: Extraocular movements intact.     Conjunctiva/sclera: Conjunctivae normal.  Cardiovascular:     Rate and Rhythm: Normal rate and regular rhythm.     Heart sounds: Murmur heard.  Pulmonary:     Effort: Pulmonary effort is normal.     Breath sounds: Normal breath sounds.  Abdominal:     General: Bowel sounds are normal.     Palpations: Abdomen is soft.  Musculoskeletal:        General: No swelling. Normal range of motion.     Cervical back: Normal range of motion and neck supple.  Skin:    General: Skin is warm and dry.     Coloration: Skin is not jaundiced.  Neurological:     General: No focal deficit present.     Mental Status: She is alert and oriented to person, place, and time.  Psychiatric:        Mood and Affect: Mood normal.        Behavior: Behavior normal.      Assessment: *Ileocolonic Crohn's disease-chronic, in remission *Chronic reflux-well controlled on daily PPI therapy   Plan:  In regards to patient's ileocolonic Crohn's, patient appears to be in both clinical and endoscopic remission at this time.   Patient took it upon herself to stop her [facility] March 2023 as she wanted to trial off any medication for Crohn's.  I discussed that by doing so we run the risk of flaring up her Crohn's disease and she understands.  I recommended that she go  back on her biologic but she states she would rather not at this time.  Patient told to call office if she has any signs of Crohn's flare including diarrhea, abdominal pain, rectal bleeding.    Check ESR, CRP, B12 levels today.   Patient has received flu vaccine and COVID booster.  Recommend she receive pneumonia vaccine, she will discuss further with her PCP.  DEXA scan up-to-date. Vitamin D level WNL   Colonoscopy recall in 2-3 years pending clinical course.    Continue on PPI for chronic reflux.   Follow-up with me in 6 months or sooner if needed.  03/21/2022 8:29 AM   Disclaimer: This note was dictated with voice recognition software. Similar sounding words can inadvertently be transcribed and may not be corrected upon review.

## 2022-03-21 NOTE — Patient Instructions (Signed)
  I am happy to hear you are doing well.  We will continue to monitor your Crohn's for now.  I am going to check CRP, ESR which are inflammatory markers today as well as B12 level.   Continue on pantoprazole for your chronic reflux.    If you have a flare then let us know.  Otherwise follow-up in 6 months.   It is always a pleasure seeing you.   Dr. Abbey Chatters

## 2022-03-23 ENCOUNTER — Encounter: Payer: Self-pay | Admitting: Cardiology

## 2022-03-23 ENCOUNTER — Ambulatory Visit: Payer: 59 | Attending: Cardiology | Admitting: Cardiology

## 2022-03-23 VITALS — BP 115/72 | HR 95 | Ht 61.0 in | Wt 152.0 lb

## 2022-03-23 DIAGNOSIS — I10 Essential (primary) hypertension: Secondary | ICD-10-CM | POA: Diagnosis not present

## 2022-03-23 DIAGNOSIS — I421 Obstructive hypertrophic cardiomyopathy: Secondary | ICD-10-CM | POA: Diagnosis not present

## 2022-03-23 MED ORDER — BENAZEPRIL HCL 20 MG PO TABS: 20.0000 mg | ORAL_TABLET | Freq: Every day | ORAL | 3 refills | Status: DC

## 2022-03-23 NOTE — Progress Notes (Signed)
Clinical Summary [name] [name] is a 52 y.o.female seen today for follow up of the following medical problems.       1. 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  03/2020 cMRI: sigmoid subtype HOCM, max thickness 18 mm with SAM of anterior MV leaflet and mod MR - I have not seen her since 01/2020 when seen with heart murmur   - reported side effects to lopressor.  - no chest pain, no SOB/DOE, no dizziness.  - wall thickness 18 mm, no syncope, no FH early SCD, has not worn monitor or had treadmill test   09/2021 echo: LVEF 55-60%, no WMAs, severe asymmetric septal hypertrophy. No gradient. Septum 1.6 cm.  09/2021 monitor: no NSVT - no SOB/DOE, no dizziness. Exercises water aerobics without symptoms.      2. Thrombocytopenia - followed by heme  3. Crohn's disease - followed by GI  4.CKD - followed by [place] [facility]  SH: former EMT, phlebotomist, nurse    Past Medical History:  Diagnosis Date   Anxiety    Arthritis    Dr. Eddie Dibbles   Bartholin cyst 2008   vag.   BIPOLAR AFFECTIVE DISORDER 04/14/2007   Crohn's ON ENTYVIO SINCE FEB [id number]   COMPLICATED BY RECTOVAGINAL FISTULA. SJS WITH REMICADE. FAILED HUMIRA.   Depression    Elevated glucose 2010   GERD (gastroesophageal reflux disease)    Hypertension    Kidney stone    LBP (low back pain)    Dr. Mina Marble   Perianal abscess 2009   Vitamin B12 deficiency      Allergies  Allergen Reactions   Cephalexin Hives and Itching   Codeine Nausea Only   Guaifenesin    Imitrex [Sumatriptan]     2 fingers got very hot   Lopressor [Metoprolol Tartrate] Nausea Only and Other (See Comments)    Made her feel jittery, sweaty / reacted opposite on her.    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 30 tablet 2   benazepril (LOTENSIN) 20 MG tablet TAKE 1 TABLET BY MOUTH EVERY DAY 90 tablet 3   Cholecalciferol (VITAMIN D3) 250 MCG (10000 UT) capsule Take 30,000 Units by mouth once a week.     cyanocobalamin (VITAMIN B12) 1000 MCG/ML injection INJECT 1ML INTO THE SKIN EVERY 14 DAYS 6 mL 5   diclofenac Sodium (VOLTAREN) 1 % GEL Apply 4 g topically 4 (four) times daily. 100 g 3   HYDROcodone-acetaminophen (NORCO) 10-325 MG tablet Take 1 tablet by mouth 3 times daily as needed for severe pain. 90 tablet 0   HYDROcodone-acetaminophen (NORCO) 10-325 MG tablet Take 1 tablet by mouth 3 (three) times daily as needed for severe pain. 03/09/22 90 tablet 0   HYDROcodone-acetaminophen (NORCO) 10-325 MG tablet Take 1 tablet by mouth 3 (three) times daily as needed. 90 tablet 0   HYDROcodone-acetaminophen (NORCO) 10-325 MG tablet Take 1 tablet by mouth 3 (three) times daily  as needed for severe pain. 04/09/22 90 tablet 0   lamoTRIgine (LAMICTAL) 200 MG tablet Take 200 mg by mouth daily.      norethindrone (AYGESTIN) 5 MG tablet TAKE 1 TABLET (5 MG TOTAL) BY MOUTH DAILY. 90 tablet 2   pantoprazole (PROTONIX) 40 MG tablet TAKE 1 TABLET BY MOUTH EVERY DAY IN THE MORNING 90 tablet 2   potassium chloride (KLOR-CON) 10 MEQ tablet TAKE 1 TABLET BY MOUTH EVERY DAY 90 tablet 3   sertraline (ZOLOFT) 100 MG tablet Take 200 mg by mouth daily.     SYRINGE-NEEDLE, DISP, 3 ML (B-D SYRINGE/NEEDLE 3CC/25GX5/8) 25G X 5/8" 3 ML MISC Use to administer B12 injection every 14 days 24 each 0   No current facility-administered medications for this visit.     Past Surgical History:  Procedure Laterality Date   BALLOON DILATION N/A 10/15/2019   Procedure: BALLOON DILATION;  Surgeon: Eloise Harman, DO;  [facility];  Service: Endoscopy;  Laterality: N/A;   BIOPSY  10/15/2019   Procedure: BIOPSY;  Surgeon: Eloise Harman, DO;  [facility];  Service: Endoscopy;;   COLONOSCOPY  09/2008   Dr. Derrill Kay: ulcers, edema, possible fistula openings all seen in the distal 3 cm of anus/anal canal. 1cm pseudopolyp. rest of colon and TI normal. rectal biopsy with mild chronic active colitis. ileum bx normal.   COLONOSCOPY WITH PROPOFOL N/A 03/29/2015   SLF: Severe proctocolitis limitied to cecum and rectum. Limited exam of the colon mucosa and anal canal.    COLONOSCOPY WITH PROPOFOL N/A 10/15/2019   Procedure: COLONOSCOPY WITH PROPOFOL;  Surgeon: Eloise Harman, DO;  [facility];  Service: Endoscopy;  Laterality: N/A;  10:45am   ELBOW SURGERY     left   ESOPHAGOGASTRODUODENOSCOPY (EGD) WITH PROPOFOL N/A 03/29/2015   SLF: 1. Erosive gastritis duodentitis.    ESOPHAGOGASTRODUODENOSCOPY (EGD) WITH PROPOFOL N/A 10/15/2019   Procedure: ESOPHAGOGASTRODUODENOSCOPY (EGD) WITH PROPOFOL;  Surgeon: Eloise Harman, DO;  [facility];  Service: Endoscopy;  Laterality: N/A;   RECTOVAGINAL FISTULA CLOSURE     did not help     Allergies  Allergen Reactions   Cephalexin Hives and Itching   Codeine Nausea Only   Guaifenesin    Imitrex [Sumatriptan]     2 fingers got very hot   Lopressor [Metoprolol Tartrate] Nausea Only and Other (See Comments)    Made her feel jittery, sweaty / reacted opposite on her.    Morphine     Doesn't work for her, can not move but can still feel the pain   Remicade [Infliximab]     Kathreen Cosier syndrome with either remicade or ultram   Sulfonamide Derivatives     Does not tolerate well   Ultram [Tramadol Hcl]     [employer] syndrome with either remicade or ultram      Family History  Problem Relation Age of Onset   Breast cancer Maternal Grandmother    Early death Maternal Grandmother 82   Cancer Maternal  Grandmother        breast   Cancer Maternal Grandfather        colon   Heart attack Father    Heart disease Father    Diabetes Mother    Heart disease Mother    Heart attack Mother    Ovarian cancer Maternal Aunt    Cancer Maternal Aunt        ovarian and cervical  Social History [name] [name] reports that she has been smoking cigarettes. She started smoking about 27 years ago. She has a 12.50 pack-year smoking history. She has never used smokeless tobacco. [name] [name] reports no history of alcohol use.   Review of Systems CONSTITUTIONAL: No weight loss, fever, chills, weakness or fatigue.  HEENT: Eyes: No visual loss, blurred vision, double vision or yellow sclerae.No hearing loss, sneezing, congestion, runny nose or sore throat.  SKIN: No rash or itching.  CARDIOVASCULAR: per hpi RESPIRATORY: No shortness of breath, cough or sputum.  GASTROINTESTINAL: No anorexia, nausea, vomiting or diarrhea. No abdominal pain or blood.  GENITOURINARY: No burning on urination, no polyuria NEUROLOGICAL: No headache, dizziness, syncope, paralysis, ataxia, numbness or tingling in the extremities. No change in bowel or bladder control.  MUSCULOSKELETAL: No muscle, back pain, joint pain or stiffness.  LYMPHATICS: No enlarged nodes. No history of splenectomy.  PSYCHIATRIC: No history of depression or anxiety.  ENDOCRINOLOGIC: No reports of sweating, cold or heat intolerance. No polyuria or polydipsia.  [name]   Physical Examination Today's Vitals   03/23/22 0817  BP: 115/72  Pulse: 95  SpO2: 96%  Weight: 152 lb (68.9 kg)  Height: '5\' 1"'$  (1.549 m)   Body mass index is 28.72 kg/m.  Gen: resting comfortably, no acute distress HEENT: no scleral icterus, pupils equal round and reactive, no palptable cervical adenopathy,  CV: RRR, 2/6 systolic murmur rusb, no jvd Resp: Clear to auscultation bilaterally GI: abdomen is soft, non-tender, non-distended, normal bowel sounds, no hepatosplenomegaly MSK:  extremities are warm, no edema.  Skin: warm, no rash Neuro:  no focal deficits Psych: appropriate affect   Diagnostic Studies Jan 2022 echo IMPRESSIONS     1. Left ventricular ejection fraction, by estimation, is 60 to 65%. The  left ventricle has normal function. The left ventricle has no regional  wall motion abnormalities. There is moderate asymmetric left ventricular  hypertrophy of the septal segment  based on limited views (study measurements are incorrect). Left  ventricular diastolic parameters are indeterminate.   2. Right ventricular systolic function is normal. The right ventricular  size is normal.   3. The mitral valve is grossly normal. No evidence of mitral valve  regurgitation.   4. The aortic valve is tricuspid and appears to have normal cusp  excursion based on limited views. Aortic valve regurgitation is not  visualized. Increased velocity across LVOT and AV likely due to asymmetric  LV septal hypertrophy (possible SAM as well)  although cannot exclude other source of subvalvular stenosis. Aortic valve  mean gradient measures 35.0 mmHg. Aortic valve Vmax measures 4.24 m/s.  Consider cardiac MRI for further evaluation.   5. The inferior vena cava is normal in size with greater than 50%  respiratory variability, suggesting right atrial pressure of 3 mmHg.      03/2020 cMRI   IMPRESSION: 1. Hypertrophic cardiomyopathy, sigmoid subtype. Maximal wall thickness 18 mm at basal anteroseptum.   2. 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   3. Focal delayed myocardial enhancement in basal anteroseptum. Total LGE is approximately 8% of myocardial mass.   4.  No LV apical aneurysm.   5.  Normal LV size and function, LVEF 57%.   6.  Normal RV size and function, RVEF 68%.   7. Resting first pass perfusion shows mild subendocardial perfusion defect in mid lateral wall. No  associated wall motion abnormality.   09/2021 monitor 3 day  monitor   Rare supraventricular ectopy in the form of isolated PACs. Two runs of SVT longest 5 beats.   Occasoinal ventricular ectopy in the form of isolated PVCs. Rare couplets, triplets.   No symptoms reported   Assessment and Plan  1. HOCM - no symptoms - side effects to lopressor, now off. In absence of symptomst not imperative to find substitute, could consider diltiazem in the future.  - no ICD risk factors - continue aggressive hydration   2. HTN - she is at goal, continue current meds      Arnoldo Lenis, M.D.

## 2022-03-23 NOTE — Patient Instructions (Signed)
Medication Instructions:  Benazepril refilled today   Labwork: none  Testing/Procedures: none  Follow-Up: Your physician wants you to follow up in:  1 year.  You should receive a call from the office when due.  If you don't receive this call, please call our office to schedule the follow up appointment    Any Other Special Instructions Will Be Listed Below (If Applicable).   If you need a refill on your cardiac medications before your next appointment, please call your pharmacy.

## 2022-04-11 ENCOUNTER — Other Ambulatory Visit (HOSPITAL_COMMUNITY): Payer: Self-pay

## 2022-04-13 ENCOUNTER — Other Ambulatory Visit (HOSPITAL_COMMUNITY): Payer: Self-pay

## 2022-04-13 ENCOUNTER — Other Ambulatory Visit: Payer: Self-pay

## 2022-05-11 ENCOUNTER — Other Ambulatory Visit (HOSPITAL_COMMUNITY): Payer: Self-pay

## 2022-05-28 ENCOUNTER — Encounter: Payer: Self-pay | Admitting: Internal Medicine

## 2022-05-28 ENCOUNTER — Other Ambulatory Visit (HOSPITAL_COMMUNITY): Payer: Self-pay

## 2022-05-28 ENCOUNTER — Ambulatory Visit (INDEPENDENT_AMBULATORY_CARE_PROVIDER_SITE_OTHER): Payer: 59 | Admitting: Internal Medicine

## 2022-05-28 VITALS — BP 124/70 | HR 99 | Temp 97.8°F | Ht 61.0 in | Wt 152.0 lb

## 2022-05-28 DIAGNOSIS — E559 Vitamin D deficiency, unspecified: Secondary | ICD-10-CM

## 2022-05-28 DIAGNOSIS — E538 Deficiency of other specified B group vitamins: Secondary | ICD-10-CM

## 2022-05-28 DIAGNOSIS — M5440 Lumbago with sciatica, unspecified side: Secondary | ICD-10-CM

## 2022-05-28 DIAGNOSIS — I10 Essential (primary) hypertension: Secondary | ICD-10-CM | POA: Diagnosis not present

## 2022-05-28 DIAGNOSIS — F32A Depression, unspecified: Secondary | ICD-10-CM

## 2022-05-28 DIAGNOSIS — G8929 Other chronic pain: Secondary | ICD-10-CM

## 2022-05-28 MED ORDER — HYDROCODONE-ACETAMINOPHEN 10-325 MG PO TABS
1.0000 | ORAL_TABLET | Freq: Three times a day (TID) | ORAL | 0 refills | Status: DC | PRN
  Filled 2022-05-28 – 2022-07-09 (×2): qty 90, 30d supply, fill #0

## 2022-05-28 MED ORDER — HYDROCODONE-ACETAMINOPHEN 10-325 MG PO TABS
1.0000 | ORAL_TABLET | Freq: Three times a day (TID) | ORAL | 0 refills | Status: DC | PRN
  Filled 2022-05-28: qty 90, fill #0
  Filled 2022-06-08: qty 90, 30d supply, fill #0

## 2022-05-28 MED ORDER — HYDROCODONE-ACETAMINOPHEN 10-325 MG PO TABS
1.0000 | ORAL_TABLET | Freq: Three times a day (TID) | ORAL | 0 refills | Status: DC | PRN
  Filled 2022-05-28 – 2022-08-09 (×2): qty 90, 30d supply, fill #0

## 2022-05-28 NOTE — Assessment & Plan Note (Signed)
Chronic - continue on B12 inj q 2 wks 

## 2022-05-28 NOTE — Assessment & Plan Note (Signed)
Norco prn. Toradol prn - rare use On disabiity  Norco prn. Potential benefits of a long term opioids use as well as potential risks (i.e. addiction risk, apnea etc) and complications (i.e. Somnolence, constipation and others) were explained to the patient and were aknowledged.

## 2022-05-28 NOTE — Progress Notes (Signed)
Subjective:  Patient ID: [name] [name], female    DOB: February 17, 1971  Age: 52 y.o. MRN: [id number]  CC: Follow-up (3 month f/u)   HPI [name] [name] presents for   Outpatient Medications Prior to Visit  Medication Sig Dispense Refill   ALPRAZolam (XANAX) 1 MG tablet Take 1 tablet (1 mg total) by mouth 2 (two) times daily. 30 tablet 2   benazepril (LOTENSIN) 20 MG tablet Take 1 tablet (20 mg total) by mouth daily. 90 tablet 3   Cholecalciferol (VITAMIN D3) 250 MCG (10000 UT) capsule Take 30,000 Units by mouth once a week.     cyanocobalamin (VITAMIN B12) 1000 MCG/ML injection INJECT INTO THE SKIN EVERY 14 DAYS 6 mL 5   diclofenac Sodium (VOLTAREN) 1 % GEL Apply 4 g topically 4 (four) times daily. 100 g 3   lamoTRIgine (LAMICTAL) 200 MG tablet Take 200 mg by mouth daily.      norethindrone (AYGESTIN) 5 MG tablet TAKE 1 TABLET (5 MG TOTAL) BY MOUTH DAILY. 90 tablet 2   pantoprazole (PROTONIX) 40 MG tablet TAKE 1 TABLET BY MOUTH EVERY DAY IN THE MORNING 90 tablet 2   potassium chloride (KLOR-CON) 10 MEQ tablet TAKE 1 TABLET BY MOUTH EVERY DAY 90 tablet 3   sertraline (ZOLOFT) 100 MG tablet Take 200 mg by mouth daily.     SYRINGE-NEEDLE, DISP, 3 ML (B-D SYRINGE/NEEDLE 3CC/25GX5/8) 25G X 5/8" 3 ML MISC Use to administer B12 injection every 14 days 24 each 0   HYDROcodone-acetaminophen (NORCO) 10-325 MG tablet Take 1 tablet by mouth 3 times daily as needed for severe pain. 90 tablet 0   HYDROcodone-acetaminophen (NORCO) 10-325 MG tablet Take 1 tablet by mouth 3 (three) times daily as needed for severe pain. 03/09/22 90 tablet 0   HYDROcodone-acetaminophen (NORCO) 10-325 MG tablet Take 1 tablet by mouth 3 (three) times daily as needed. 90 tablet 0   HYDROcodone-acetaminophen (NORCO) 10-325 MG tablet Take 1 tablet by mouth 3 (three) times daily as needed for severe pain. 04/09/22 90 tablet 0   No facility-administered medications prior to visit.    ROS: Review of Systems  Constitutional:   Negative for activity change, appetite change, chills, fatigue and unexpected weight change.  HENT:  Negative for congestion, mouth sores and sinus pressure.   Eyes:  Negative for visual disturbance.  Respiratory:  Negative for cough and chest tightness.   Gastrointestinal:  Negative for abdominal pain and nausea.  Genitourinary:  Negative for difficulty urinating, frequency and vaginal pain.  Musculoskeletal:  Positive for back pain. Negative for gait problem.  Skin:  Negative for pallor and rash.  Neurological:  Negative for dizziness, tremors, weakness, numbness and headaches.  Psychiatric/Behavioral:  Negative for confusion and sleep disturbance.     Objective:  BP 124/70 (BP Location: Left Arm, Patient Position: Sitting, Cuff Size: Large)   Pulse 99   Temp 97.8 F (36.6 C) (Temporal)   Ht 5\' 1"  (1.549 m)   Wt 152 lb (68.9 kg)   SpO2 100%   BMI 28.72 kg/m   BP Readings from Last 3 Encounters:  05/28/22 124/70  03/23/22 115/72  03/21/22 127/83    Wt Readings from Last 3 Encounters:  05/28/22 152 lb (68.9 kg)  03/23/22 152 lb (68.9 kg)  03/21/22 151 lb 9.6 oz (68.8 kg)    Physical Exam Constitutional:      General: She is not in acute distress.    Appearance: She is well-developed. She is obese.  HENT:     Head: Normocephalic.     Right Ear: External ear normal.     Left Ear: External ear normal.     Nose: Nose normal.  Eyes:     General:        Right eye: No discharge.        Left eye: No discharge.     Conjunctiva/sclera: Conjunctivae normal.     Pupils: Pupils are equal, round, and reactive to light.  Neck:     Thyroid: No thyromegaly.     Vascular: No JVD.     Trachea: No tracheal deviation.  Cardiovascular:     Rate and Rhythm: Normal rate and regular rhythm.     Heart sounds: Normal heart sounds.  Pulmonary:     Effort: No respiratory distress.     Breath sounds: No stridor. No wheezing.  Abdominal:     General: Bowel sounds are normal. There is no  distension.     Palpations: Abdomen is soft. There is no mass.     Tenderness: There is no abdominal tenderness. There is no guarding or rebound.  Musculoskeletal:        General: No tenderness.     Cervical back: Normal range of motion and neck supple. No rigidity.  Lymphadenopathy:     Cervical: No cervical adenopathy.  Skin:    Findings: No erythema or rash.  Neurological:     Cranial Nerves: No cranial nerve deficit.     Motor: No abnormal muscle tone.     Coordination: Coordination normal.     Deep Tendon Reflexes: Reflexes normal.  Psychiatric:        Behavior: Behavior normal.        Thought Content: Thought content normal.        Judgment: Judgment normal.     Lab Results  Component Value Date   WBC 7.9 03/09/2022   HGB 15.8 (H) 03/09/2022   HCT 45.7 03/09/2022   PLT 145 (L) 03/09/2022   GLUCOSE 100 (H) 03/09/2022   CHOL 168 08/30/2014   TRIG 99.0 08/30/2014   HDL 27.40 (L) 08/30/2014   LDLDIRECT 171.9 05/01/2011   LDLCALC 121 (H) 08/30/2014   ALT 12 03/09/2022   AST 13 (L) 03/09/2022   NA 140 03/09/2022   K 4.1 03/09/2022   CL 104 03/09/2022   CREATININE 1.58 (H) 03/09/2022   BUN 16 03/09/2022   CO2 25 03/09/2022   TSH 1.901 03/09/2022   INR 1.1 12/05/2020   HGBA1C 4.9 10/11/2021    No results found.  Assessment & Plan:   Problem List Items Addressed This Visit       Cardiovascular and Mediastinum   Essential hypertension, benign - Primary    On Benazepril po        Other   B12 deficiency    Chronic - continue on B12 inj q 2 wks      Depression    Chronic Cont on Zoloft      LOW BACK PAIN, CHRONIC    Norco prn. Toradol prn - rare use On disabiity  Norco prn. Potential benefits of a long term opioids use as well as potential risks (i.e. addiction risk, apnea etc) and complications (i.e. Somnolence, constipation and others) were explained to the patient and were aknowledged.      Relevant Medications   HYDROcodone-acetaminophen  (NORCO) 10-325 MG tablet   HYDROcodone-acetaminophen (NORCO) 10-325 MG tablet   HYDROcodone-acetaminophen (NORCO) 10-325 MG tablet   Vitamin D deficiency  On Vit D         Meds ordered this encounter  Medications   HYDROcodone-acetaminophen (NORCO) 10-325 MG tablet    Sig: Take 1 tablet by mouth 3 times daily as needed for severe pain.    Dispense:  90 tablet    Refill:  0    Please fill on or after 06/08/22   HYDROcodone-acetaminophen (NORCO) 10-325 MG tablet    Sig: Take 1 tablet by mouth 3 (three) times daily as needed for severe pain. 03/09/22    Dispense:  90 tablet    Refill:  0    Please fill on or after 07/08/22   HYDROcodone-acetaminophen (NORCO) 10-325 MG tablet    Sig: Take 1 tablet by mouth 3 (three) times daily as needed.    Dispense:  90 tablet    Refill:  0    Please fill on or after 08/07/22      Follow-up: Return in about 3 months (around 08/27/2022) for a follow-up visit.  Sonda PrimesAlex Shamecka Hocutt, MD

## 2022-05-28 NOTE — Assessment & Plan Note (Signed)
On Vit D 

## 2022-05-28 NOTE — Assessment & Plan Note (Signed)
Chronic Cont on Zoloft 

## 2022-05-28 NOTE — Assessment & Plan Note (Signed)
On Benazepril po

## 2022-06-08 ENCOUNTER — Other Ambulatory Visit (HOSPITAL_COMMUNITY): Payer: Self-pay

## 2022-06-08 ENCOUNTER — Inpatient Hospital Stay: Payer: 59 | Attending: Hematology

## 2022-06-08 ENCOUNTER — Other Ambulatory Visit: Payer: 59

## 2022-06-08 DIAGNOSIS — Z8049 Family history of malignant neoplasm of other genital organs: Secondary | ICD-10-CM | POA: Diagnosis not present

## 2022-06-08 DIAGNOSIS — D696 Thrombocytopenia, unspecified: Secondary | ICD-10-CM | POA: Diagnosis present

## 2022-06-08 DIAGNOSIS — F17210 Nicotine dependence, cigarettes, uncomplicated: Secondary | ICD-10-CM | POA: Insufficient documentation

## 2022-06-08 DIAGNOSIS — Z800 Family history of malignant neoplasm of digestive organs: Secondary | ICD-10-CM | POA: Insufficient documentation

## 2022-06-08 DIAGNOSIS — R161 Splenomegaly, not elsewhere classified: Secondary | ICD-10-CM | POA: Insufficient documentation

## 2022-06-08 DIAGNOSIS — Z803 Family history of malignant neoplasm of breast: Secondary | ICD-10-CM | POA: Insufficient documentation

## 2022-06-08 DIAGNOSIS — K5090 Crohn's disease, unspecified, without complications: Secondary | ICD-10-CM | POA: Diagnosis not present

## 2022-06-08 DIAGNOSIS — Z8041 Family history of malignant neoplasm of ovary: Secondary | ICD-10-CM | POA: Insufficient documentation

## 2022-06-08 LAB — CBC WITH DIFFERENTIAL/PLATELET
Abs Immature Granulocytes: 0.03 10*3/uL (ref 0.00–0.07)
Basophils Absolute: 0 10*3/uL (ref 0.0–0.1)
Basophils Relative: 0 %
Eosinophils Absolute: 0.1 10*3/uL (ref 0.0–0.5)
Eosinophils Relative: 1 %
HCT: 45.2 % (ref 36.0–46.0)
Hemoglobin: 15.3 g/dL — ABNORMAL HIGH (ref 12.0–15.0)
Immature Granulocytes: 0 %
Lymphocytes Relative: 20 %
Lymphs Abs: 1.8 10*3/uL (ref 0.7–4.0)
MCH: 31.5 pg (ref 26.0–34.0)
MCHC: 33.8 g/dL (ref 30.0–36.0)
MCV: 93.2 fL (ref 80.0–100.0)
Monocytes Absolute: 0.4 10*3/uL (ref 0.1–1.0)
Monocytes Relative: 4 %
Neutro Abs: 6.6 10*3/uL (ref 1.7–7.7)
Neutrophils Relative %: 75 %
Platelets: 128 10*3/uL — ABNORMAL LOW (ref 150–400)
RBC: 4.85 MIL/uL (ref 3.87–5.11)
RDW: 13.2 % (ref 11.5–15.5)
WBC: 8.9 10*3/uL (ref 4.0–10.5)
nRBC: 0 % (ref 0.0–0.2)

## 2022-06-08 LAB — LACTATE DEHYDROGENASE: LDH: 131 U/L (ref 98–192)

## 2022-06-11 ENCOUNTER — Other Ambulatory Visit: Payer: 59

## 2022-06-15 NOTE — Progress Notes (Unsigned)
[name] [address], [place] [address]   CLINIC:  Medical Oncology/Hematology  PCP:  Tresa Garter, MD [address] [place] [address] [phone]   REASON FOR VISIT:  Follow-up for thrombocytopenia  PRIOR THERAPY: None  CURRENT THERAPY: Surveillance  INTERVAL HISTORY:   [name] [name] 53 y.o. female returns for routine follow-up of thrombocytopenia.  She was last seen by Dr. Ellin Saba on 06/13/2021.  At today's visit, she reports feeling fairly well.  No recent hospitalizations, surgeries, or changes in baseline health status.  She denies any abnormal bleeding.  She admits to easy bruising, denies petechial rash.  She denies any new masses or lymphadenopathy.  She reports cold, drenching night sweats once or twice a month for the past 2 years.  She wonders if this is related to her norethindrone hormone therapy.  She denies any fevers, chills, unintentional weight loss.  Denies symptomatic splenomegaly such as early satiety, nausea, LUQ abdominal pain.  She has 75% energy and 100% appetite. She endorses that she is maintaining a stable weight.   ASSESSMENT & PLAN:  1.  Thrombocytopenia - Previous workup including hepatitis serology, ANA, rheumatoid factor, SPEP were negative.  Nutritional deficiency workup was also negative. - Etiology thought to be secondary to splenomegaly.  May also have some immune-mediated thrombocytopenia in the setting of Crohn's disease - She previously had easy bleeding and bruising that resolved after Thompson Grayer was stopped in December 2022.  Crohn's disease has been stable off of Entyvio. - Most recent labs (06/08/2022) with platelets 128, stable within usual baseline.  LDH normal. -Reports cold, drenching night sweats once or twice a month.  Denies any other constitutional symptoms or symptomatic splenomegaly. - PLAN: Continue annual monitoring with labs and follow-up visit.  2.  Splenomegaly - Patient reports  persistent splenomegaly since she was diagnosed with mononucleosis in her mid 55s.  Reports that she was first told she had an enlarged spleen about 30 years ago. - On physical exam, spleen is palpable about 2 to 3 fingerbreadths below the left costal margin.  - Splenomegaly measuring 17.7 cm on ultrasound from 12/20/2020 - CT abdomen/pelvis (07/03/2021) shows splenomegaly measuring 17.3 x 15.3 x 7.2 cm.  No focal liver abnormality is seen. - Splenomegaly may also be related to her underlying Crohn's disease.  3.  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  PLAN SUMMARY: >> Labs in 1 year = CBC/D, LDH >> OFFICE visit in 1 year (after labs)      REVIEW OF SYSTEMS:   Review of Systems  Constitutional:  Positive for fatigue (mild, at baseline). Negative for appetite change, chills, diaphoresis, fever and unexpected weight change.  HENT:   Negative for lump/mass and nosebleeds.   Eyes:  Negative for eye problems.  Respiratory:  Negative for cough, hemoptysis and shortness of breath.   Cardiovascular:  Negative for chest pain, leg swelling and palpitations.  Gastrointestinal:  Negative for abdominal pain, blood in stool, constipation, diarrhea, nausea and vomiting.  Genitourinary:  Negative for hematuria.   Skin: Negative.   Neurological:  Negative for dizziness, headaches and light-headedness.  Hematological:  Does not bruise/bleed easily.     PHYSICAL EXAM:  ECOG PERFORMANCE STATUS: 0 - Asymptomatic  There were no vitals filed for this visit. There were no vitals filed for this  visit. Physical Exam Constitutional:      Appearance: Normal appearance. She is obese.  Cardiovascular:     Heart sounds: Murmur heard.  Pulmonary:     Breath sounds: Normal breath sounds.  Neurological:     General: No focal deficit  present.     Mental Status: Mental status is at baseline.  Psychiatric:        Behavior: Behavior normal. Behavior is cooperative.     PAST MEDICAL/SURGICAL HISTORY:  Past Medical History:  Diagnosis Date   Anxiety    Arthritis    Dr. Renae Fickle   Bartholin cyst 2008   vag.   BIPOLAR AFFECTIVE DISORDER 04/14/2007   Crohn's ON ENTYVIO SINCE FEB 2017 2000   COMPLICATED BY RECTOVAGINAL FISTULA. SJS WITH REMICADE. FAILED HUMIRA.   Depression    Elevated glucose 2010   GERD (gastroesophageal reflux disease)    Hypertension    Kidney stone    LBP (low back pain)    Dr. Regino Schultze   Perianal abscess 2009   Vitamin B12 deficiency    Past Surgical History:  Procedure Laterality Date   BALLOON DILATION N/A 10/15/2019   Procedure: BALLOON DILATION;  Surgeon: Lanelle Bal, DO;  [facility];  Service: Endoscopy;  Laterality: N/A;   BIOPSY  10/15/2019   Procedure: BIOPSY;  Surgeon: Lanelle Bal, DO;  [facility]: [facility];  Service: Endoscopy;;   COLONOSCOPY  09/2008   Dr. Alycia Rossetti: ulcers, edema, possible fistula openings all seen in the distal 3 cm of anus/anal canal. 1cm pseudopolyp. rest of colon and TI normal. rectal biopsy with mild chronic active colitis. ileum bx normal.   COLONOSCOPY WITH PROPOFOL N/A 03/29/2015   SLF: Severe proctocolitis limitied to cecum and rectum. Limited exam of the colon mucosa and anal canal.    COLONOSCOPY WITH PROPOFOL N/A 10/15/2019   Procedure: COLONOSCOPY WITH PROPOFOL;  Surgeon: Lanelle Bal, DO;  [facility]: [facility];  Service: Endoscopy;  Laterality: N/A;  10:45am   ELBOW SURGERY     left   ESOPHAGOGASTRODUODENOSCOPY (EGD) WITH PROPOFOL N/A 03/29/2015   SLF: 1. Erosive gastritis duodentitis.    ESOPHAGOGASTRODUODENOSCOPY (EGD) WITH PROPOFOL N/A 10/15/2019   Procedure: ESOPHAGOGASTRODUODENOSCOPY (EGD) WITH PROPOFOL;  Surgeon: Lanelle Bal, DO;  [facility]: [facility];  Service: Endoscopy;  Laterality: N/A;   RECTOVAGINAL FISTULA  CLOSURE     did not help    SOCIAL HISTORY:  Social History   Socioeconomic History   Marital status: Married    Spouse name: Not on file   Number of children: Not on file   Years of education: Not on file   Highest education level: Not on file  Occupational History   Not on file  Tobacco Use   Smoking status: Every Day    Packs/day: 0.50    Years: 25.00    Additional pack years: 0.00    Total pack years: 12.50    Types: Cigarettes    Start date: 12/03/1994    Last attempt to quit: 12/21/2018    Years since quitting: 3.4   Smokeless tobacco: Never   Tobacco comments:    Started back   Vaping Use   Vaping Use: Never used  Substance and Sexual Activity   Alcohol use: No   Drug use: No   Sexual activity: Not Currently    Partners: Male    Birth control/protection: Pill  Other Topics Concern   Not on file  Social History Narrative   Regular  Exercise-no   Social Determinants of Health   Financial Resource Strain: Low Risk  (01/03/2022)   Overall Financial Resource Strain (CARDIA)    Difficulty of Paying Living Expenses: Not hard at all  Food Insecurity: No Food Insecurity (01/03/2022)   Hunger Vital Sign    Worried About Running Out of Food in the Last Year: Never true    Ran Out of Food in the Last Year: Never true  Transportation Needs: No Transportation Needs (01/03/2022)   PRAPARE - Administrator, Civil Service (Medical): No    Lack of Transportation (Non-Medical): No  Physical Activity: Sufficiently Active (01/03/2022)   Exercise Vital Sign    Days of Exercise per Week: 4 days    Minutes of Exercise per Session: 60 min  Stress: No Stress Concern Present (01/03/2022)   [employer] of Occupational Health - Occupational Stress Questionnaire    Feeling of Stress : Not at all  Social Connections: Socially Integrated (01/03/2022)   Social Connection and Isolation Panel [NHANES]    Frequency of Communication with Friends and Family: Three times  a week    Frequency of Social Gatherings with Friends and Family: Twice a week    Attends Religious Services: More than 4 times per year    Active Member of [employer] or Organizations: Yes    Attends Engineer, structural: More than 4 times per year    Marital Status: Married  Catering manager Violence: Not At Risk (01/03/2022)   Humiliation, Afraid, Rape, and Kick questionnaire    Fear of Current or Ex-Partner: No    Emotionally Abused: No    Physically Abused: No    Sexually Abused: No    FAMILY HISTORY:  Family History  Problem Relation Age of Onset   Breast cancer Maternal Grandmother    Early death Maternal Grandmother 21   Cancer Maternal Grandmother        breast   Cancer Maternal Grandfather        colon   Heart attack Father    Heart disease Father    Diabetes Mother    Heart disease Mother    Heart attack Mother    Ovarian cancer Maternal Aunt    Cancer Maternal Aunt        ovarian and cervical    CURRENT MEDICATIONS:  Outpatient Encounter Medications as of 06/18/2022  Medication Sig   ALPRAZolam (XANAX) 1 MG tablet Take 1 tablet (1 mg total) by mouth 2 (two) times daily.   benazepril (LOTENSIN) 20 MG tablet Take 1 tablet (20 mg total) by mouth daily.   Cholecalciferol (VITAMIN D3) 250 MCG (10000 UT) capsule Take 30,000 Units by mouth once a week.   cyanocobalamin (VITAMIN B12) 1000 MCG/ML injection INJECT INTO THE SKIN EVERY 14 DAYS   diclofenac Sodium (VOLTAREN) 1 % GEL Apply 4 g topically 4 (four) times daily.   HYDROcodone-acetaminophen (NORCO) 10-325 MG tablet Take 1 tablet by mouth 3 (three) times daily as needed for severe pain.   [START ON 07/09/2022] HYDROcodone-acetaminophen (NORCO) 10-325 MG tablet Take 1 tablet by mouth 3 (three) times daily as needed for severe pain. 07/08/22   HYDROcodone-acetaminophen (NORCO) 10-325 MG tablet Take 1 tablet by mouth 3 (three) times daily as needed.   lamoTRIgine (LAMICTAL) 200 MG tablet Take 200 mg by mouth  daily.    norethindrone (AYGESTIN) 5 MG tablet TAKE 1 TABLET (5 MG TOTAL) BY MOUTH DAILY.   pantoprazole (PROTONIX) 40 MG tablet TAKE 1 TABLET  BY MOUTH EVERY DAY IN THE MORNING   potassium chloride (KLOR-CON) 10 MEQ tablet TAKE 1 TABLET BY MOUTH EVERY DAY   sertraline (ZOLOFT) 100 MG tablet Take 200 mg by mouth daily.   SYRINGE-NEEDLE, DISP, 3 ML (B-D SYRINGE/NEEDLE 3CC/25GX5/8) 25G X 5/8" 3 ML MISC Use to administer B12 injection every 14 days   No facility-administered encounter medications on file as of 06/18/2022.    ALLERGIES:  Allergies  Allergen Reactions   Cephalexin Hives and Itching   Codeine Nausea Only   Guaifenesin    Imitrex [Sumatriptan]     2 fingers got very hot   Lopressor [Metoprolol Tartrate] Nausea Only and Other (See Comments)    Made her feel jittery, sweaty / reacted opposite on her.    Morphine     Doesn't work for her, can not move but can still feel the pain   Remicade [Infliximab]     Levonne Spiller syndrome with either remicade or ultram   Sulfonamide Derivatives     Does not tolerate well   Ultram [Tramadol Hcl]     [employer] syndrome with either remicade or ultram    LABORATORY DATA:  I have reviewed the labs as listed.  CBC    Component Value Date/Time   WBC 8.9 06/08/2022 0907   RBC 4.85 06/08/2022 0907   HGB 15.3 (H) 06/08/2022 0907   HGB 12.1 01/27/2019 1002   HCT 45.2 06/08/2022 0907   HCT 36.5 01/27/2019 1002   PLT 128 (L) 06/08/2022 0907   PLT 146 (L) 01/27/2019 1002   MCV 93.2 06/08/2022 0907   MCV 90 01/27/2019 1002   MCH 31.5 06/08/2022 0907   MCHC 33.8 06/08/2022 0907   RDW 13.2 06/08/2022 0907   RDW 13.0 01/27/2019 1002   LYMPHSABS 1.8 06/08/2022 0907   LYMPHSABS 1.5 01/27/2019 1002   MONOABS 0.4 06/08/2022 0907   EOSABS 0.1 06/08/2022 0907   EOSABS 0.1 01/27/2019 1002   BASOSABS 0.0 06/08/2022 0907   BASOSABS 0.0 01/27/2019 1002      Latest Ref Rng & Units 03/09/2022    9:01 AM 03/09/2022    8:34 AM 10/11/2021    10:05 AM  CMP  Glucose 70 - 99 mg/dL 161  096  93   BUN 6 - 20 mg/dL 16  16  20    Creatinine 0.44 - 1.00 mg/dL 0.45  4.09  8.11   Sodium 135 - 145 mmol/L 140  140  140   Potassium 3.5 - 5.1 mmol/L 4.1  4.2  4.2   Chloride 98 - 111 mmol/L 104  105  108   CO2 22 - 32 mmol/L 25  25  26    Calcium 8.9 - 10.3 mg/dL 9.5  9.6    9.8  9.0   Total Protein 6.5 - 8.1 g/dL  7.8    Total Bilirubin 0.3 - 1.2 mg/dL  1.0    Alkaline Phos 38 - 126 U/L  68    AST 15 - 41 U/L  13    ALT 0 - 44 U/L  12      DIAGNOSTIC IMAGING:  I have independently reviewed the relevant imaging and discussed with the patient.   WRAP UP:  All questions were answered. The patient knows to call the clinic with any problems, questions or concerns.  Medical decision making: Low  Time spent on visit: I spent 15 minutes counseling the patient face to face. The total time spent in the appointment was 22 minutes and  more than 50% was on counseling.  Carnella Guadalajara, PA-C  06/18/22 8:52 AM

## 2022-06-18 ENCOUNTER — Inpatient Hospital Stay (HOSPITAL_BASED_OUTPATIENT_CLINIC_OR_DEPARTMENT_OTHER): Payer: 59 | Admitting: Physician Assistant

## 2022-06-18 VITALS — BP 127/81 | HR 91 | Temp 97.2°F | Resp 18 | Ht 62.0 in | Wt 151.0 lb

## 2022-06-18 DIAGNOSIS — D696 Thrombocytopenia, unspecified: Secondary | ICD-10-CM | POA: Diagnosis not present

## 2022-06-22 ENCOUNTER — Other Ambulatory Visit: Payer: Self-pay | Admitting: Internal Medicine

## 2022-07-07 ENCOUNTER — Other Ambulatory Visit: Payer: Self-pay | Admitting: Internal Medicine

## 2022-07-09 ENCOUNTER — Other Ambulatory Visit (HOSPITAL_COMMUNITY): Payer: Self-pay

## 2022-08-06 ENCOUNTER — Other Ambulatory Visit: Payer: Self-pay | Admitting: Adult Health

## 2022-08-09 ENCOUNTER — Other Ambulatory Visit: Payer: Self-pay

## 2022-08-09 ENCOUNTER — Other Ambulatory Visit (HOSPITAL_COMMUNITY): Payer: Self-pay

## 2022-08-28 ENCOUNTER — Other Ambulatory Visit (HOSPITAL_COMMUNITY): Payer: Self-pay

## 2022-08-28 ENCOUNTER — Encounter: Payer: Self-pay | Admitting: Internal Medicine

## 2022-08-28 ENCOUNTER — Telehealth: Payer: Self-pay

## 2022-08-28 ENCOUNTER — Other Ambulatory Visit: Payer: Self-pay | Admitting: Internal Medicine

## 2022-08-28 ENCOUNTER — Ambulatory Visit (INDEPENDENT_AMBULATORY_CARE_PROVIDER_SITE_OTHER): Payer: 59 | Admitting: Internal Medicine

## 2022-08-28 VITALS — BP 120/70 | HR 90 | Temp 98.6°F | Ht 62.0 in | Wt 152.0 lb

## 2022-08-28 DIAGNOSIS — G8929 Other chronic pain: Secondary | ICD-10-CM

## 2022-08-28 DIAGNOSIS — N183 Chronic kidney disease, stage 3 unspecified: Secondary | ICD-10-CM | POA: Diagnosis not present

## 2022-08-28 DIAGNOSIS — M5440 Lumbago with sciatica, unspecified side: Secondary | ICD-10-CM

## 2022-08-28 DIAGNOSIS — Z13220 Encounter for screening for lipoid disorders: Secondary | ICD-10-CM | POA: Diagnosis not present

## 2022-08-28 DIAGNOSIS — E559 Vitamin D deficiency, unspecified: Secondary | ICD-10-CM | POA: Diagnosis not present

## 2022-08-28 DIAGNOSIS — F17291 Nicotine dependence, other tobacco product, in remission: Secondary | ICD-10-CM

## 2022-08-28 DIAGNOSIS — E538 Deficiency of other specified B group vitamins: Secondary | ICD-10-CM | POA: Diagnosis not present

## 2022-08-28 DIAGNOSIS — F32A Depression, unspecified: Secondary | ICD-10-CM | POA: Diagnosis not present

## 2022-08-28 DIAGNOSIS — J9611 Chronic respiratory failure with hypoxia: Secondary | ICD-10-CM | POA: Insufficient documentation

## 2022-08-28 DIAGNOSIS — F17200 Nicotine dependence, unspecified, uncomplicated: Secondary | ICD-10-CM | POA: Insufficient documentation

## 2022-08-28 DIAGNOSIS — F419 Anxiety disorder, unspecified: Secondary | ICD-10-CM

## 2022-08-28 LAB — URINALYSIS, ROUTINE W REFLEX MICROSCOPIC
Bilirubin Urine: NEGATIVE
Ketones, ur: NEGATIVE
Nitrite: NEGATIVE
Specific Gravity, Urine: 1.025 (ref 1.000–1.030)
Total Protein, Urine: NEGATIVE
Urine Glucose: NEGATIVE
Urobilinogen, UA: 1 (ref 0.0–1.0)
pH: 6 (ref 5.0–8.0)

## 2022-08-28 LAB — CBC WITH DIFFERENTIAL/PLATELET
Basophils Absolute: 0 10*3/uL (ref 0.0–0.1)
Basophils Relative: 0.4 % (ref 0.0–3.0)
Eosinophils Absolute: 0.1 10*3/uL (ref 0.0–0.7)
Eosinophils Relative: 0.9 % (ref 0.0–5.0)
HCT: 43.6 % (ref 36.0–46.0)
Hemoglobin: 14.8 g/dL (ref 12.0–15.0)
Lymphocytes Relative: 18.7 % (ref 12.0–46.0)
Lymphs Abs: 1.6 10*3/uL (ref 0.7–4.0)
MCHC: 34 g/dL (ref 30.0–36.0)
MCV: 89.4 fl (ref 78.0–100.0)
Monocytes Absolute: 0.3 10*3/uL (ref 0.1–1.0)
Monocytes Relative: 3.8 % (ref 3.0–12.0)
Neutro Abs: 6.3 10*3/uL (ref 1.4–7.7)
Neutrophils Relative %: 76.2 % (ref 43.0–77.0)
Platelets: 141 10*3/uL — ABNORMAL LOW (ref 150.0–400.0)
RBC: 4.87 Mil/uL (ref 3.87–5.11)
RDW: 13.6 % (ref 11.5–15.5)
WBC: 8.3 10*3/uL (ref 4.0–10.5)

## 2022-08-28 LAB — VITAMIN B12: Vitamin B-12: 443 pg/mL (ref 211–911)

## 2022-08-28 LAB — COMPREHENSIVE METABOLIC PANEL
ALT: 11 U/L (ref 0–35)
AST: 12 U/L (ref 0–37)
Albumin: 4.9 g/dL (ref 3.5–5.2)
Alkaline Phosphatase: 62 U/L (ref 39–117)
BUN: 16 mg/dL (ref 6–23)
CO2: 29 mEq/L (ref 19–32)
Calcium: 10.1 mg/dL (ref 8.4–10.5)
Chloride: 104 mEq/L (ref 96–112)
Creatinine, Ser: 1.6 mg/dL — ABNORMAL HIGH (ref 0.40–1.20)
GFR: 36.79 mL/min — ABNORMAL LOW (ref >60.00)
Glucose, Bld: 119 mg/dL — ABNORMAL HIGH (ref 70–99)
Potassium: 3.8 mEq/L (ref 3.5–5.1)
Sodium: 142 mEq/L (ref 135–145)
Total Bilirubin: 0.6 mg/dL (ref 0.2–1.2)
Total Protein: 7.5 g/dL (ref 6.0–8.3)

## 2022-08-28 LAB — LIPID PANEL
Cholesterol: 221 mg/dL — ABNORMAL HIGH (ref 0–200)
HDL: 31 mg/dL — ABNORMAL LOW (ref >39.00)
NonHDL: 189.64
Total CHOL/HDL Ratio: 7
Triglycerides: 210 mg/dL — ABNORMAL HIGH (ref 0.0–149.0)
VLDL: 42 mg/dL — ABNORMAL HIGH (ref 0.0–40.0)

## 2022-08-28 LAB — TSH: TSH: 1.86 u[IU]/mL (ref 0.35–5.50)

## 2022-08-28 LAB — LDL CHOLESTEROL, DIRECT: Direct LDL: 169 mg/dL

## 2022-08-28 LAB — VITAMIN D 25 HYDROXY (VIT D DEFICIENCY, FRACTURES): VITD: 118.78 ng/mL (ref 30.00–100.00)

## 2022-08-28 MED ORDER — HYDROCODONE-ACETAMINOPHEN 10-325 MG PO TABS
1.0000 | ORAL_TABLET | Freq: Three times a day (TID) | ORAL | 0 refills | Status: DC | PRN
  Filled 2022-08-28 – 2022-11-09 (×3): qty 90, 30d supply, fill #0

## 2022-08-28 MED ORDER — HYDROCODONE-ACETAMINOPHEN 10-325 MG PO TABS
1.0000 | ORAL_TABLET | Freq: Three times a day (TID) | ORAL | 0 refills | Status: DC | PRN
  Filled 2022-08-28 – 2022-09-08 (×2): qty 90, 30d supply, fill #0

## 2022-08-28 MED ORDER — HYDROCODONE-ACETAMINOPHEN 10-325 MG PO TABS
1.0000 | ORAL_TABLET | Freq: Three times a day (TID) | ORAL | 0 refills | Status: DC | PRN
  Filled 2022-08-28 – 2022-10-09 (×2): qty 90, 30d supply, fill #0

## 2022-08-28 NOTE — Assessment & Plan Note (Signed)
Chronic. 

## 2022-08-28 NOTE — Assessment & Plan Note (Signed)
On Vit D 

## 2022-08-28 NOTE — Telephone Encounter (Signed)
CRITICAL VALUE STICKER  CRITICAL VALUE: Vitamin D @ 118.78  RECEIVER (on-site recipient of call): Byrd Hesselbach  DATE & TIME NOTIFIED:  08/28/2022 @11 :49 am  MESSENGER (representative from lab): Jason Fila  MD NOTIFIED: Yes  TIME OF NOTIFICATION: 11:50 am.  RESPONSE:

## 2022-08-28 NOTE — Assessment & Plan Note (Signed)
Chronic - continue on B12 inj q 2 wks 

## 2022-08-28 NOTE — Progress Notes (Signed)
Subjective:  Patient ID: [name] [name], female    DOB: 19-Aug-1970  Age: 52 y.o. MRN: [id number]  CC: Follow-up (3 [facility] F/U)   HPI [name] [name] presents for anxiety, LBP, HTN  Outpatient Medications Prior to Visit  Medication Sig Dispense Refill   ALPRAZolam (XANAX) 1 MG tablet Take 1 tablet (1 mg total) by mouth 2 (two) times daily. 30 tablet 2   benazepril (LOTENSIN) 20 MG tablet Take 1 tablet (20 mg total) by mouth daily. 90 tablet 3   Cholecalciferol (VITAMIN D3) 250 MCG (10000 UT) capsule Take 30,000 Units by mouth once a week.     cyanocobalamin (VITAMIN B12) 1000 MCG/ML injection INJECT INTO THE SKIN EVERY 14 DAYS 6 mL 5   diclofenac Sodium (VOLTAREN) 1 % GEL Apply 4 g topically 4 (four) times daily. 100 g 3   lamoTRIgine (LAMICTAL) 200 MG tablet Take 200 mg by mouth daily.      norethindrone (AYGESTIN) 5 MG tablet TAKE 1 TABLET (5 MG TOTAL) BY MOUTH DAILY. 90 tablet 2   pantoprazole (PROTONIX) 40 MG tablet TAKE 1 TABLET BY MOUTH EVERY DAY IN THE MORNING 90 tablet 1   potassium chloride (KLOR-CON) 10 MEQ tablet TAKE 1 TABLET BY MOUTH EVERY DAY 90 tablet 3   sertraline (ZOLOFT) 100 MG tablet Take 200 mg by mouth daily.     SYRINGE-NEEDLE, DISP, 3 ML (B-D SYRINGE/NEEDLE 3CC/25GX5/8) 25G X 5/8" 3 ML MISC Use to administer B12 injection every 14 days 24 each 0   HYDROcodone-acetaminophen (NORCO) 10-325 MG tablet Take 1 tablet by mouth 3 (three) times daily as needed for severe pain. 90 tablet 0   HYDROcodone-acetaminophen (NORCO) 10-325 MG tablet Take 1 tablet by mouth 3 times daily as needed for severe pain. 07/08/22 90 tablet 0   HYDROcodone-acetaminophen (NORCO) 10-325 MG tablet Take 1 tablet by mouth 3 times daily as needed. 90 tablet 0   No facility-administered medications prior to visit.    ROS: Review of Systems  Constitutional:  Negative for activity change, appetite change, chills, fatigue and unexpected weight change.  HENT:  Negative for congestion, mouth sores  and sinus pressure.   Eyes:  Negative for visual disturbance.  Respiratory:  Negative for cough and chest tightness.   Gastrointestinal:  Negative for abdominal pain and nausea.  Genitourinary:  Negative for difficulty urinating, frequency and vaginal pain.  Musculoskeletal:  Positive for back pain. Negative for gait problem.  Skin:  Negative for pallor and rash.  Neurological:  Negative for dizziness, tremors, weakness, numbness and headaches.  Psychiatric/Behavioral:  Negative for confusion, sleep disturbance and suicidal ideas. The patient is nervous/anxious.     Objective:  BP 120/70 (BP Location: Left Arm, Patient Position: Sitting, Cuff Size: Normal)   Pulse 90   Temp 98.6 F (37 C) (Oral)   Ht 5\' 2"  (1.575 m)   Wt 152 lb (68.9 kg)   SpO2 97%   BMI 27.80 kg/m   BP Readings from Last 3 Encounters:  08/28/22 120/70  06/18/22 127/81  05/28/22 124/70    Wt Readings from Last 3 Encounters:  08/28/22 152 lb (68.9 kg)  06/18/22 151 lb (68.5 kg)  05/28/22 152 lb (68.9 kg)    Physical Exam Constitutional:      General: She is not in acute distress.    Appearance: She is well-developed. She is obese.  HENT:     Head: Normocephalic.     Right Ear: External ear normal.  Left Ear: External ear normal.     Nose: Nose normal.  Eyes:     General:        Right eye: No discharge.        Left eye: No discharge.     Conjunctiva/sclera: Conjunctivae normal.     Pupils: Pupils are equal, round, and reactive to light.  Neck:     Thyroid: No thyromegaly.     Vascular: No JVD.     Trachea: No tracheal deviation.  Cardiovascular:     Rate and Rhythm: Normal rate and regular rhythm.     Heart sounds: Normal heart sounds.  Pulmonary:     Effort: No respiratory distress.     Breath sounds: No stridor. No wheezing.  Abdominal:     General: Bowel sounds are normal. There is no distension.     Palpations: Abdomen is soft. There is no mass.     Tenderness: There is no abdominal  tenderness. There is no guarding or rebound.  Musculoskeletal:        General: No tenderness.     Cervical back: Normal range of motion and neck supple. No rigidity.  Lymphadenopathy:     Cervical: No cervical adenopathy.  Skin:    Findings: No erythema or rash.  Neurological:     Cranial Nerves: No cranial nerve deficit.     Motor: No abnormal muscle tone.     Coordination: Coordination normal.     Deep Tendon Reflexes: Reflexes normal.  Psychiatric:        Behavior: Behavior normal.        Thought Content: Thought content normal.        Judgment: Judgment normal.     Lab Results  Component Value Date   WBC 8.9 06/08/2022   HGB 15.3 (H) 06/08/2022   HCT 45.2 06/08/2022   PLT 128 (L) 06/08/2022   GLUCOSE 100 (H) 03/09/2022   CHOL 168 08/30/2014   TRIG 99.0 08/30/2014   HDL 27.40 (L) 08/30/2014   LDLDIRECT 171.9 05/01/2011   LDLCALC 121 (H) 08/30/2014   ALT 12 03/09/2022   AST 13 (L) 03/09/2022   NA 140 03/09/2022   K 4.1 03/09/2022   CL 104 03/09/2022   CREATININE 1.58 (H) 03/09/2022   BUN 16 03/09/2022   CO2 25 03/09/2022   TSH 1.901 03/09/2022   INR 1.1 12/05/2020   HGBA1C 4.9 10/11/2021    No results found.  Assessment & Plan:   Problem List Items Addressed This Visit     B12 deficiency    Chronic - continue on B12 inj q 2 wks      Relevant Orders   Vitamin B12   Anxiety    Chronic Cont on Xanax prn  Potential benefits of a long term benzodiazepines  use as well as potential risks  and complications were explained to the patient and were aknowledged. Do not take w/pain meds.      Depression - Primary    Chronic Cont on Zoloft      Relevant Orders   TSH   Urinalysis   CBC with Differential/Platelet   Lipid panel   Comprehensive metabolic panel   Pain Management Screening Profile (10S)   Vitamin B12   VITAMIN D 25 Hydroxy (Vit-D Deficiency, Fractures)   LOW BACK PAIN, CHRONIC    Norco prn. Toradol prn - rare use On disabiity  Norco prn.  Potential benefits of a long term opioids use as well as potential risks (i.e. addiction  risk, apnea etc) and complications (i.e. Somnolence, constipation and others) were explained to the patient and were aknowledged.      Relevant Medications   HYDROcodone-acetaminophen (NORCO) 10-325 MG tablet   HYDROcodone-acetaminophen (NORCO) 10-325 MG tablet   HYDROcodone-acetaminophen (NORCO) 10-325 MG tablet   Other Relevant Orders   TSH   Urinalysis   CBC with Differential/Platelet   Lipid panel   Comprehensive metabolic panel   Pain Management Screening Profile (10S)   Vitamin B12   VITAMIN D 25 Hydroxy (Vit-D Deficiency, Fractures)   Vitamin D deficiency    On Vit D      Relevant Orders   TSH   Urinalysis   CBC with Differential/Platelet   Lipid panel   Comprehensive metabolic panel   Pain Management Screening Profile (10S)   Vitamin B12   VITAMIN D 25 Hydroxy (Vit-D Deficiency, Fractures)   CRI (chronic renal insufficiency), stage 3 (moderate) (HCC)    GFR 36 No NSAIDs      Relevant Orders   TSH   Urinalysis   CBC with Differential/Platelet   Lipid panel   Comprehensive metabolic panel   Pain Management Screening Profile (10S)   Vitamin B12   VITAMIN D 25 Hydroxy (Vit-D Deficiency, Fractures)   Nicotine dependence    Chronic         Meds ordered this encounter  Medications   HYDROcodone-acetaminophen (NORCO) 10-325 MG tablet    Sig: Take 1 tablet by mouth 3 (three) times daily as needed for severe pain.    Dispense:  90 tablet    Refill:  0    Please fill on or after 11/05/22   HYDROcodone-acetaminophen (NORCO) 10-325 MG tablet    Sig: Take 1 tablet by mouth 3 times daily as needed for severe pain. 07/08/22    Dispense:  90 tablet    Refill:  0    Please fill on or after 09/06/22   HYDROcodone-acetaminophen (NORCO) 10-325 MG tablet    Sig: Take 1 tablet by mouth 3 times daily as needed.    Dispense:  90 tablet    Refill:  0    Please fill on or after  10/06/22      Follow-up: No follow-ups on file.  Sonda Primes, MD

## 2022-08-28 NOTE — Assessment & Plan Note (Signed)
GFR 36 No NSAIDs

## 2022-08-28 NOTE — Assessment & Plan Note (Signed)
Chronic Cont on Xanax prn  Potential benefits of a long term benzodiazepines  use as well as potential risks  and complications were explained to the patient and were aknowledged. Do not take w/pain meds. 

## 2022-08-28 NOTE — Assessment & Plan Note (Signed)
Norco prn. Toradol prn - rare use On disabiity  Norco prn. Potential benefits of a long term opioids use as well as potential risks (i.e. addiction risk, apnea etc) and complications (i.e. Somnolence, constipation and others) were explained to the patient and were aknowledged. 

## 2022-08-28 NOTE — Assessment & Plan Note (Signed)
Chronic Cont on Zoloft 

## 2022-08-29 LAB — PMP SCREEN PROFILE (10S), URINE
Amphetamine Scrn, Ur: NEGATIVE ng/mL
BARBITURATE SCREEN URINE: NEGATIVE ng/mL
BENZODIAZEPINE SCREEN, URINE: POSITIVE ng/mL — AB
CANNABINOIDS UR QL SCN: NEGATIVE ng/mL
Cocaine (Metab) Scrn, Ur: NEGATIVE ng/mL
Creatinine(Crt), U: 164.1 mg/dL (ref 20.0–300.0)
Methadone Screen, Urine: NEGATIVE ng/mL
OXYCODONE+OXYMORPHONE UR QL SCN: NEGATIVE ng/mL
Opiate Scrn, Ur: POSITIVE ng/mL — AB
Ph of Urine: 5.6 (ref 4.5–8.9)
Phencyclidine Qn, Ur: NEGATIVE ng/mL
Propoxyphene Scrn, Ur: NEGATIVE ng/mL

## 2022-08-30 NOTE — Telephone Encounter (Signed)
Stop vitamin D.  Thanks

## 2022-08-30 NOTE — Telephone Encounter (Signed)
LDVM for pt to call clinic with any questions and or concerns.

## 2022-09-04 ENCOUNTER — Other Ambulatory Visit (HOSPITAL_COMMUNITY): Payer: Self-pay

## 2022-09-08 ENCOUNTER — Other Ambulatory Visit (HOSPITAL_COMMUNITY): Payer: Self-pay

## 2022-09-11 ENCOUNTER — Other Ambulatory Visit (HOSPITAL_COMMUNITY): Payer: Self-pay | Admitting: Adult Health

## 2022-09-11 DIAGNOSIS — Z1231 Encounter for screening mammogram for malignant neoplasm of breast: Secondary | ICD-10-CM

## 2022-09-19 ENCOUNTER — Encounter: Payer: Self-pay | Admitting: Internal Medicine

## 2022-09-19 ENCOUNTER — Ambulatory Visit (INDEPENDENT_AMBULATORY_CARE_PROVIDER_SITE_OTHER): Payer: 59 | Admitting: Internal Medicine

## 2022-09-19 ENCOUNTER — Encounter: Payer: Self-pay | Admitting: General Surgery

## 2022-09-19 ENCOUNTER — Ambulatory Visit (INDEPENDENT_AMBULATORY_CARE_PROVIDER_SITE_OTHER): Payer: 59 | Admitting: General Surgery

## 2022-09-19 VITALS — BP 142/73 | HR 79 | Temp 98.2°F | Resp 16 | Ht 62.0 in | Wt 151.0 lb

## 2022-09-19 VITALS — BP 120/77 | HR 98 | Temp 98.8°F | Ht 62.0 in | Wt 152.0 lb

## 2022-09-19 DIAGNOSIS — L03112 Cellulitis of left axilla: Secondary | ICD-10-CM

## 2022-09-19 DIAGNOSIS — K50013 Crohn's disease of small intestine with fistula: Secondary | ICD-10-CM

## 2022-09-19 DIAGNOSIS — M79622 Pain in left upper arm: Secondary | ICD-10-CM | POA: Diagnosis not present

## 2022-09-19 DIAGNOSIS — K219 Gastro-esophageal reflux disease without esophagitis: Secondary | ICD-10-CM | POA: Diagnosis not present

## 2022-09-19 DIAGNOSIS — L732 Hidradenitis suppurativa: Secondary | ICD-10-CM

## 2022-09-19 MED ORDER — CLINDAMYCIN PHOSPHATE 1 % EX GEL: Freq: Two times a day (BID) | CUTANEOUS | 0 refills | Status: DC

## 2022-09-19 MED ORDER — DOXYCYCLINE HYCLATE 50 MG PO CAPS: 50.0000 mg | ORAL_CAPSULE | Freq: Two times a day (BID) | ORAL | 0 refills | Status: DC

## 2022-09-19 NOTE — Progress Notes (Signed)
Referring Provider: Tresa Garter, MD Primary Care Physician:  Tresa Garter, MD Primary GI:  Dr. Marletta Lor  Chief Complaint  Patient presents with   Gastroesophageal Reflux    Follow up on GERD. Takes protonix in the morning and states doing well with it.    Crohn's Disease    Follow up on Crohn's. Currently not taking any treatment. States she is doing well and no problems.     HPI:   [name] [name] is a 52 y.o. female who presents to the clinic today for follow up visit. 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  She established care with [place] in January 2017.  She has previously failed Imuran, Humira, Asacol.  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  She was reportedly in a research trial at [facility] for stem cell research at one point.     According to gynecology note dated 05/09/2018 noted a draining rectovaginal fistula with left buttock fluid collection.  Cultures were sent and found to be staph aureus.  Thompson Grayer was held during this time which she has been maintained on since 2017.  CT pelvis in June showed evidence of rectovaginal fistula unchanged from 2018.  Patient was continued on Entyvio with CT in December 10, 2018 which showed a nodular density posterior to the rectum likely representing scarring related to prior inflammatory changes collection.  No drainable fluid identified   Patient was hospitalized in October 2020 with sepsis due to E. coli bacteremia related to UTI as well as multifocal pneumonia and aspiration.  March 2023, patient stopped her Entyvio on her own.  She states that she wanted to trial off any medications for Crohn's.  CT abdomen pelvis 07/03/2021 showed no active Crohn's disease.  No evidence of rectovaginal fistula.  Today, Notes normal bowel movements.  No melena or hematochezia.  No abdominal  pain.  No mucus in her stools.  Overall doing well.  Does complain of cellulitis/abscess in her left axilla.  States she had abscess formation prior many years ago with subsequent incision and drainage.  Has been taking 5 days of ciprofloxacin that she had at home.  Notes this is worsening   Procedures: EGD and colonoscopy on 10/15/2019.  Her colon looked remarkably healthy with negative biopsies for any inflammation throughout her entire colon.  Her terminal ileum also looked healthy with negative biopsies as well.  She did have one small tubular adenoma removed. Her EGD showed a Schatzki's ring in her distal esophagus which was dilated with an 18 mm balloon.  She also had gastritis.  Upper endoscopy biopsies were relatively unremarkable besides chronic gastritis.   EGD and colonoscopy in February 2017 showed inflammation and ulceration in the cecum involving the IC valve which was unable to be intubated with colonoscope.  Also noted severe proctitis as well.  EGD showed erosive gastritis duodenitis.  Biopsies from cecal right colon with chronic active colitis and chronic inactive colitis in the rectum.  Negative for dysplasia.  Past Medical History:  Diagnosis Date   Anxiety    Arthritis    Dr. Renae Fickle   Bartholin cyst 2008   vag.   BIPOLAR AFFECTIVE DISORDER 04/14/2007   Crohn's ON ENTYVIO SINCE FEB 2017 2000   COMPLICATED BY RECTOVAGINAL FISTULA. SJS WITH REMICADE. FAILED HUMIRA.   Depression    Elevated glucose 2010   GERD (gastroesophageal reflux disease)  Hypertension    Kidney stone    LBP (low back pain)    Dr. Regino Schultze   Perianal abscess 2009   Vitamin B12 deficiency     Past Surgical History:  Procedure Laterality Date   BALLOON DILATION N/A 10/15/2019   Procedure: BALLOON DILATION;  Surgeon: Lanelle Bal, DO;  [facility];  Service: Endoscopy;  Laterality: N/A;   BIOPSY  10/15/2019   Procedure: BIOPSY;  Surgeon: Lanelle Bal, DO;  [facility];   Service: Endoscopy;;   COLONOSCOPY  09/2008   Dr. Alycia Rossetti: ulcers, edema, possible fistula openings all seen in the distal 3 cm of anus/anal canal. 1cm pseudopolyp. rest of colon and TI normal. rectal biopsy with mild chronic active colitis. ileum bx normal.   COLONOSCOPY WITH PROPOFOL N/A 03/29/2015   SLF: Severe proctocolitis limitied to cecum and rectum. Limited exam of the colon mucosa and anal canal.    COLONOSCOPY WITH PROPOFOL N/A 10/15/2019   Procedure: COLONOSCOPY WITH PROPOFOL;  Surgeon: Lanelle Bal, DO;  [facility];  Service: Endoscopy;  Laterality: N/A;  10:45am   ELBOW SURGERY     left   ESOPHAGOGASTRODUODENOSCOPY (EGD) WITH PROPOFOL N/A 03/29/2015   SLF: 1. Erosive gastritis duodentitis.    ESOPHAGOGASTRODUODENOSCOPY (EGD) WITH PROPOFOL N/A 10/15/2019   Procedure: ESOPHAGOGASTRODUODENOSCOPY (EGD) WITH PROPOFOL;  Surgeon: Lanelle Bal, DO;  [facility];  Service: Endoscopy;  Laterality: N/A;   RECTOVAGINAL FISTULA CLOSURE     did not help    Current Outpatient Medications  Medication Sig Dispense Refill   ALPRAZolam (XANAX) 1 MG tablet Take 1 tablet (1 mg total) by mouth 2 (two) times daily. 30 tablet 2   benazepril (LOTENSIN) 20 MG tablet Take 1 tablet (20 mg total) by mouth daily. 90 tablet 3   cyanocobalamin (VITAMIN B12) 1000 MCG/ML injection INJECT INTO THE SKIN EVERY 14 DAYS 6 mL 5   diclofenac Sodium (VOLTAREN) 1 % GEL Apply 4 g topically 4 (four) times daily. 100 g 3   HYDROcodone-acetaminophen (NORCO) 10-325 MG tablet Take 1 tablet by mouth 3 (three) times daily as needed for severe pain. 90 tablet 0   HYDROcodone-acetaminophen (NORCO) 10-325 MG tablet Take 1 tablet by mouth 3 times daily as needed for severe pain. 90 tablet 0   HYDROcodone-acetaminophen (NORCO) 10-325 MG tablet Take 1 tablet by mouth 3 times daily as needed. 90 tablet 0   lamoTRIgine (LAMICTAL) 200 MG tablet Take 200 mg by mouth daily.      norethindrone (AYGESTIN) 5 MG  tablet TAKE 1 TABLET (5 MG TOTAL) BY MOUTH DAILY. 90 tablet 2   pantoprazole (PROTONIX) 40 MG tablet TAKE 1 TABLET BY MOUTH EVERY DAY IN THE MORNING 90 tablet 1   potassium chloride (KLOR-CON) 10 MEQ tablet TAKE 1 TABLET BY MOUTH EVERY DAY 90 tablet 3   sertraline (ZOLOFT) 100 MG tablet Take 200 mg by mouth daily.     SYRINGE-NEEDLE, DISP, 3 ML (B-D SYRINGE/NEEDLE 3CC/25GX5/8) 25G X 5/8" 3 ML MISC Use to administer B12 injection every 14 days 24 each 0   No current facility-administered medications for this visit.    Allergies as of 09/19/2022 - Review Complete 09/19/2022  Allergen Reaction Noted   Cephalexin Hives and Itching    Codeine Nausea Only    Guaifenesin     Imitrex [sumatriptan]  10/17/2011   Lopressor [metoprolol tartrate] Nausea Only and Other (See Comments) 06/09/2020   Morphine  Remicade [infliximab]  05/26/2015   Sulfonamide derivatives     Ultram [tramadol hcl]  05/26/2015    Family History  Problem Relation Age of Onset   Breast cancer Maternal Grandmother    Early death Maternal Grandmother 1   Cancer Maternal Grandmother        breast   Cancer Maternal Grandfather        colon   Heart attack Father    Heart disease Father    Diabetes Mother    Heart disease Mother    Heart attack Mother    Ovarian cancer Maternal Aunt    Cancer Maternal Aunt        ovarian and cervical    Social History   Socioeconomic History   Marital status: Married    Spouse name: Not on file   Number of children: Not on file   Years of education: Not on file   Highest education level: Not on file  Occupational History   Not on file  Tobacco Use   Smoking status: Every Day    Current packs/day: 0.00    Average packs/day: 0.5 packs/day for 25.0 years (12.5 ttl pk-yrs)    Types: Cigarettes    Start date: 12/03/1994    Last attempt to quit: 12/21/2018    Years since quitting: 3.7   Smokeless tobacco: Never   Tobacco comments:    Started back   Vaping Use   Vaping  status: Never Used  Substance and Sexual Activity   Alcohol use: No   Drug use: No   Sexual activity: Not Currently    Partners: Male    Birth control/protection: Pill  Other Topics Concern   Not on file  Social History Narrative   Regular Exercise-no   Social Determinants of Health   Financial Resource Strain: Low Risk  (01/03/2022)   Overall Financial Resource Strain (CARDIA)    Difficulty of Paying Living Expenses: Not hard at all  Food Insecurity: No Food Insecurity (01/03/2022)   Hunger Vital Sign    Worried About Running Out of Food in the Last Year: Never true    Ran Out of Food in the Last Year: Never true  Transportation Needs: No Transportation Needs (01/03/2022)   PRAPARE - Administrator, Civil Service (Medical): No    Lack of Transportation (Non-Medical): No  Physical Activity: Sufficiently Active (01/03/2022)   Exercise Vital Sign    Days of Exercise per Week: 4 days    Minutes of Exercise per Session: 60 min  Stress: No Stress Concern Present (01/03/2022)   [employer] of Occupational Health - Occupational Stress Questionnaire    Feeling of Stress : Not at all  Social Connections: Socially Integrated (01/03/2022)   Social Connection and Isolation Panel [NHANES]    Frequency of Communication with Friends and Family: Three times a week    Frequency of Social Gatherings with Friends and Family: Twice a week    Attends Religious Services: More than 4 times per year    Active Member of [employer] or Organizations: Yes    Attends Engineer, structural: More than 4 times per year    Marital Status: Married    Subjective: Review of Systems  Constitutional:  Negative for chills and fever.  HENT:  Negative for congestion and hearing loss.   Eyes:  Negative for blurred vision and double vision.  Respiratory:  Negative for cough and shortness of breath.   Cardiovascular:  Negative for chest pain and  palpitations.  Gastrointestinal:  Negative for  abdominal pain, blood in stool, constipation, diarrhea, heartburn, melena and vomiting.  Genitourinary:  Negative for dysuria and urgency.  Musculoskeletal:  Negative for joint pain and myalgias.  Skin:  Negative for itching and rash.  Neurological:  Negative for dizziness and headaches.  Psychiatric/Behavioral:  Negative for depression. The patient is not nervous/anxious.      Objective: BP 120/77 (BP Location: Left Arm, Patient Position: Sitting, Cuff Size: Normal)   Pulse 98   Temp 98.8 F (37.1 C) (Oral)   Ht 5\' 2"  (1.575 m)   Wt 152 lb (68.9 kg)   BMI 27.80 kg/m  Physical Exam Constitutional:      Appearance: Normal appearance.  HENT:     Head: Normocephalic and atraumatic.  Eyes:     Extraocular Movements: Extraocular movements intact.     Conjunctiva/sclera: Conjunctivae normal.  Cardiovascular:     Rate and Rhythm: Normal rate and regular rhythm.     Heart sounds: Murmur heard.  Pulmonary:     Effort: Pulmonary effort is normal.     Breath sounds: Normal breath sounds.  Abdominal:     General: Bowel sounds are normal.     Palpations: Abdomen is soft.  Musculoskeletal:        General: No swelling. Normal range of motion.     Cervical back: Normal range of motion and neck supple.  Skin:    General: Skin is warm and dry.     Coloration: Skin is not jaundiced.     Comments: Left axilla: Small induration, tender to palpation, no significant erythema, no drainage.  Skin feels warm.  Neurological:     General: No focal deficit present.     Mental Status: She is alert and oriented to person, place, and time.  Psychiatric:        Mood and Affect: Mood normal.        Behavior: Behavior normal.      Assessment: *Ileocolonic Crohn's disease-chronic, in remission *Chronic reflux-well controlled on daily PPI therapy *Cellulitis/abscess of the left axilla   Plan:  In regards to patient's ileocolonic Crohn's, patient appears to be in both clinical and endoscopic  remission at this time.   Patient took it upon herself to stop her [facility] March 2023 as she wanted to trial off any medication for Crohn's.  I discussed that by doing so we run the risk of flaring up her Crohn's disease and she understands.  I recommended that she go back on her biologic but she states she would rather not at this time.  Patient told to call office if she has any signs of Crohn's flare including diarrhea, abdominal pain, rectal bleeding.    Patient has received flu vaccine and COVID booster.  Recommend she receive pneumonia vaccine, she will discuss further with her PCP.  DEXA scan up-to-date. Vitamin D level WNL   Colonoscopy recall in 2-3 years pending clinical course.    Continue on PPI for chronic reflux.  Contacted [facility] in regards to patient's abscess.  They very graciously accepted to see her this afternoon for evaluation.  Appreciate Dr. Sol Passer assistance.    Follow-up with me in 6 months or sooner if needed.  09/19/2022 9:30 AM   Disclaimer: This note was dictated with voice recognition software. Similar sounding words can inadvertently be transcribed and may not be corrected upon review.

## 2022-09-19 NOTE — Patient Instructions (Signed)
Could be that you have hidradenitis since this is recurrent.  Take antibiotic  Call if this does not improve Can use the clindamycin ointment on the area too. It can help if you have future issues.  Get your mammogram as scheduled.   We discussed the pathophysiology of hidradenitis and the likelihood that it involves blockages in hair follicles and sweat glands. We discussed that we do not know the exact cause but that this is what is suspected. We discussed that this is a spectrum of disease from small localized areas to extensive disease in the armpits, groin, between the breast and in other areas where one sweats.  One develops painful, blocked areas in these regions that can become infected and form sinus tracks or tunnels. These areas causing scarring and inflammation, and can leak pus and have an odor.  We discussed the risk factors such as obesity, smoking, metabolic syndrome/ diabetes.  There is also some link between genetics and immune system issues.    Given the spectrum of disease, some hidradenitis can improve with medical therapies and some need excision.  Excision of disease is best left for disease that is very well localized or that is so extensive and persistent that has failed to improve with other treatments.  Surgery for hidradenitis can be very disfiguring and painful. Large open wounds may be left in sensitive areas like the armpit and groin, and the hidradenitis can recur even after excision. Multiple excisions can be necessary as well as long-term wound care with months of packing and even skin grafts.   More recent studies are showing that medical treatments and lifestyle modifications may be more effective at treating this disease and cause less morbidity (pain and suffering) than a large surgical debridement.  We discussed exhausting these medical therapies including potentially biologics or immune modulators and discussion with a dermatologist prior to pursuing any surgical  intervention.

## 2022-09-19 NOTE — Patient Instructions (Signed)
I have reached out to the surgeons in regards to your abscess.  We will let you know ASAP once we have heard back.  Will continue to monitor your Crohn's for now.  Follow-up in 6 months or sooner if needed.  It is always a pleasure seeing you.  Dr. Marletta Lor

## 2022-09-19 NOTE — Progress Notes (Addendum)
I was present with the medical student for this service. I personally verified the history of present illness, performed the physical exam, and made the plan for this encounter. I have verified the medical student's documentation and made modifications where appropriately. I have personally documented in my own words a brief history, physical, and plan below.     Likely hidradenitis based on exam and story. Doxycyline 7 days Clindamycin as needed   Algis Greenhouse, MD [facility] [address] Vella Raring [place], [place] [address] [phone] (office)   [facility] History and Physical  Reason for Referral: Left axillary pain and possible abscess Referring Physician: Dr. Marletta Lor (GI)  Chief Complaint   New Patient (Initial Visit)     [name] [name] is a 52 y.o. female.  HPI: Patient has a history of Crohn's and is complaining of left axillary bumps and pain that started last week. She is concerned this is an abscess since she has had this in the past in the same area and required I/D >10 years ago. She has had multiple abscesses in the past including buttock and groin. She says the pain in her armpit is a burning sensation and she got concerned when she started feeling "hotness" up her arm. She denies fevers or chills. She has tried warm Epsom salt soaks without relief. She took cipro for five days which finished yesterday and helped some. Notes some clear and minimally bloody drainage form the area.  She had been on Remicade for her Crohn's years ago and developed multiple abscesses during that time.  She stopped taking immunosuppressants Thompson Grayer) a year ago.  She is not on any blood thinners.  Past Medical History:  Diagnosis Date   Anxiety    Arthritis    Dr. Renae Fickle   Bartholin cyst 2008   vag.   BIPOLAR AFFECTIVE DISORDER 04/14/2007   Crohn's ON ENTYVIO SINCE FEB 2017 2000   COMPLICATED BY RECTOVAGINAL FISTULA. SJS WITH REMICADE.  FAILED HUMIRA.   Depression    Elevated glucose 2010   GERD (gastroesophageal reflux disease)    Hypertension    Kidney stone    LBP (low back pain)    Dr. Regino Schultze   Perianal abscess 2009   Vitamin B12 deficiency     Past Surgical History:  Procedure Laterality Date   BALLOON DILATION N/A 10/15/2019   Procedure: BALLOON DILATION;  Surgeon: Lanelle Bal, DO;  [facility];  Service: Endoscopy;  Laterality: N/A;   BIOPSY  10/15/2019   Procedure: BIOPSY;  Surgeon: Lanelle Bal, DO;  [facility];  Service: Endoscopy;;   COLONOSCOPY  09/2008   Dr. Alycia Rossetti: ulcers, edema, possible fistula openings all seen in the distal 3 cm of anus/anal canal. 1cm pseudopolyp. rest of colon and TI normal. rectal biopsy with mild chronic active colitis. ileum bx normal.   COLONOSCOPY WITH PROPOFOL N/A 03/29/2015   SLF: Severe proctocolitis limitied to cecum and rectum. Limited exam of the colon mucosa and anal canal.    COLONOSCOPY WITH PROPOFOL N/A 10/15/2019   Procedure: COLONOSCOPY WITH PROPOFOL;  Surgeon: Lanelle Bal, DO;  [facility];  Service: Endoscopy;  Laterality: N/A;  10:45am   ELBOW SURGERY     left   ESOPHAGOGASTRODUODENOSCOPY (EGD) WITH PROPOFOL N/A 03/29/2015   SLF: 1. Erosive gastritis duodentitis.    ESOPHAGOGASTRODUODENOSCOPY (EGD) WITH PROPOFOL N/A 10/15/2019   Procedure: ESOPHAGOGASTRODUODENOSCOPY (EGD) WITH PROPOFOL;  Surgeon: Lanelle Bal, DO;  [facility];  Service: Endoscopy;  Laterality: N/A;   RECTOVAGINAL FISTULA CLOSURE     did not help    Family History  Problem Relation Age of Onset   Breast cancer Maternal Grandmother    Early death Maternal Grandmother 72   Cancer Maternal Grandmother        breast   Cancer Maternal Grandfather        colon   Heart attack Father    Heart disease Father    Diabetes Mother    Heart disease Mother    Heart attack Mother    Ovarian cancer Maternal Aunt    Cancer Maternal Aunt         ovarian and cervical    Social History   Tobacco Use   Smoking status: Every Day    Current packs/day: 0.00    Average packs/day: 0.5 packs/day for 25.0 years (12.5 ttl pk-yrs)    Types: Cigarettes    Start date: 12/03/1994    Last attempt to quit: 12/21/2018    Years since quitting: 3.7   Smokeless tobacco: Never   Tobacco comments:    Started back   Vaping Use   Vaping status: Never Used  Substance Use Topics   Alcohol use: No   Drug use: No    Medications: I have reviewed the patient's current medications. Allergies as of 09/19/2022       Reactions   Cephalexin Hives, Itching   Codeine Nausea Only   Guaifenesin    Imitrex [sumatriptan]    2 fingers got very hot   Lopressor [metoprolol Tartrate] Nausea Only, Other (See Comments)   Made her feel jittery, sweaty / reacted opposite on her.    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 If you have any questions, ask your nurse or doctor.          STOP taking these medications    diclofenac Sodium 1 % Gel Commonly known as: Voltaren Stopped by: Lanelle Bal       TAKE these medications    ALPRAZolam 1 MG tablet Commonly known as: XANAX Take 1 tablet (1 mg total) by mouth 2 (two) times daily.   benazepril 20 MG tablet Commonly known as: LOTENSIN Take 1 tablet (20 mg total) by mouth daily.   cyanocobalamin 1000 MCG/ML injection Commonly known as: VITAMIN B12 INJECT INTO THE SKIN EVERY 14 DAYS   HYDROcodone-acetaminophen 10-325 MG tablet Commonly known as: NORCO Take 1 tablet by mouth 3 (three) times daily as needed for severe pain.   HYDROcodone-acetaminophen  10-325 MG tablet Commonly known as: NORCO Take 1 tablet by mouth 3 times daily as needed for severe pain.   HYDROcodone-acetaminophen 10-325 MG tablet Commonly known as: NORCO Take 1 tablet by mouth 3 times daily as needed.   lamoTRIgine 200 MG tablet Commonly known as: LAMICTAL Take 200 mg by mouth daily.   norethindrone 5 MG tablet Commonly known as: AYGESTIN TAKE 1 TABLET (5 MG TOTAL) BY MOUTH DAILY.   pantoprazole 40 MG tablet Commonly known as: PROTONIX  TAKE 1 TABLET BY MOUTH EVERY DAY IN THE MORNING   potassium chloride 10 MEQ tablet Commonly known as: KLOR-CON TAKE 1 TABLET BY MOUTH EVERY DAY   sertraline 100 MG tablet Commonly known as: ZOLOFT Take 200 mg by mouth daily.   SYRINGE-NEEDLE (DISP) 3 ML 25G X 5/8" 3 ML Misc Commonly known as: B-D SYRINGE/NEEDLE 3CC/25GX5/8 Use to administer B12 injection every 14 days         ROS:  Pertinent items are noted in HPI.  Blood pressure (!) 142/73, pulse 79, temperature 98.2 F (36.8 C), temperature source Oral, resp. rate 16, height 5\' 2"  (1.575 m), weight 151 lb (68.5 kg), SpO2 98%. Physical Exam  General: pleasant and well-appearing Pulm: no increased work of breathing Abdominal: Soft, nondistended. No tenderness of palpation, rebound tenderness or guarding Skin: pits and tracts noted in the left axillary region, as well as prior surgical scars from I/D. Two small areas of swelling palpated at the bottommost scar and in the axilla. No erythema, drainage, tenderness to palpation, or notable fluctuance palpated along the midaxillary line up to the biceps Psych: A&O x3  Results: No results found for this or any previous visit (from the past 48 hour(s)).  No results found.   Assessment & Plan:  [name] [name] is a 52 y.o. female with Mhx of Crohn's (not on medication), recurrent abscesses, and anxiety/depression presenting with 1 week of axillary pain in an area of a previous abscess requiring I/D. There are no  obvious signs of an abscess on exam requiring I/D, and patient without systemic symptoms. Based on exam and her recurrent abscesses when off immunosuppressants, cannot rule out hidradenitis suppurativa. Less likely to be reactive lymph nodes secondary to viral infection given lack of viral symptoms. Mammograms have been normal in the past and this is unlikely to be related to a breast malignancy. She plans to follow up with Gyn in August for her annual mammogram. Recommend conservative treatment with antibiotics and as needed clindamycin cream for now (patient allergic to cephalexin and keflex). Patient advised to follow up if symptoms do not resolve. Can consider obtaining US if her symptoms do not resolve.  -Doxycyline for 7 days -Clindamycin cream prn for future reoccurrences  -Return precautions provided -Follow up with Gyn for pap smear and mammogram -Follow up prn  All questions were answered to the satisfaction of the patient.   Dominica Severin 09/19/2022, 3:14 PM

## 2022-09-20 MED ORDER — DOXYCYCLINE HYCLATE 50 MG PO CAPS: 50.0000 mg | ORAL_CAPSULE | Freq: Two times a day (BID) | ORAL | 0 refills | Status: DC

## 2022-09-26 ENCOUNTER — Other Ambulatory Visit (HOSPITAL_COMMUNITY): Admission: RE | Admit: 2022-09-26 | Payer: 59 | Source: Ambulatory Visit

## 2022-09-26 ENCOUNTER — Encounter: Payer: Self-pay | Admitting: Adult Health

## 2022-09-26 ENCOUNTER — Ambulatory Visit (HOSPITAL_COMMUNITY)
Admission: RE | Admit: 2022-09-26 | Discharge: 2022-09-26 | Disposition: A | Discharge status: Home / Self Care | Payer: 59 | Source: Ambulatory Visit | Attending: Adult Health | Admitting: Adult Health

## 2022-09-26 ENCOUNTER — Ambulatory Visit (INDEPENDENT_AMBULATORY_CARE_PROVIDER_SITE_OTHER): Payer: 59 | Admitting: Adult Health

## 2022-09-26 VITALS — BP 128/76 | HR 85 | Ht 62.0 in | Wt 148.5 lb

## 2022-09-26 DIAGNOSIS — Z01419 Encounter for gynecological examination (general) (routine) without abnormal findings: Secondary | ICD-10-CM | POA: Diagnosis not present

## 2022-09-26 DIAGNOSIS — Z1231 Encounter for screening mammogram for malignant neoplasm of breast: Secondary | ICD-10-CM | POA: Diagnosis not present

## 2022-09-26 NOTE — Progress Notes (Addendum)
Patient ID: [name] [name], female   DOB: 1970-10-02, 52 y.o.   MRN: [id number] History of Present Illness: [name] [name] is a 52 year old white female,married, G0P0, in for a well woman gyn exam and pap. She says her husband had MR and scans will get results soon, she is worried it is cancer. Pap was ASCUS with negative HPV 09/04/21.  PCP is Dr [name] [name]   Current Medications, Allergies, Past Medical History, Past Surgical History, Family History and Social History were reviewed in [employer] record.     Review of Systems: Patient denies any headaches, hearing loss, fatigue, blurred vision, shortness of breath, chest pain, abdominal pain, problems with bowel movements(she has Crohn's), urination, or intercourse(not active). No joint pain or mood swings.  Denies any vaginal bleeding Has boil under left arm on antibiotics   Physical Exam:BP 128/76 (BP Location: Left Arm, Patient Position: Sitting, Cuff Size: Normal)   Pulse 85   Ht 5\' 2"  (1.575 m)   Wt 148 lb 8 oz (67.4 kg)   BMI 27.16 kg/m   General:  Well developed, well nourished, no acute distress Skin:  Warm and dry Neck:  Midline trachea, normal thyroid, good ROM, no lymphadenopathy Lungs; Clear to auscultation bilaterally Breast:  No dominant palpable mass, retraction, or nipple discharge, has what appears to be HS left under arm Cardiovascular: Regular rate and rhythm Abdomen:  Soft, non tender, no hepatosplenomegaly Pelvic:  External genitalia is normal in appearance, no lesions.  The vagina is normal in appearance. Urethra has no lesions or masses. The cervix is smooth, pap with HR HPV genotyping performed.  Uterus is felt to be normal size, shape, and contour.  No adnexal masses or tenderness noted.Bladder is non tender, no masses felt. Rectal: deferred at her request  Extremities/musculoskeletal:  No swelling or varicosities noted, no clubbing or cyanosis Psych:  No mood changes, alert and cooperative,seems  happy AA is 0 Fall risk is low    09/26/2022    8:29 AM 08/28/2022    7:50 AM 05/28/2022    8:03 AM  Depression screen PHQ 2/9  Decreased Interest 0 0 0  Down, Depressed, Hopeless 0 0 0  PHQ - 2 Score 0 0 0  Altered sleeping 0    Tired, decreased energy 1    Change in appetite 0    Feeling bad or failure about yourself  0    Trouble concentrating 0    Moving slowly or fidgety/restless 0    Suicidal thoughts 0    PHQ-9 Score 1         09/26/2022    8:30 AM 09/04/2021    8:28 AM 05/06/2020    8:39 AM 11/12/2016    8:54 AM  GAD 7 : Generalized Anxiety Score  Nervous, Anxious, on Edge 0 0 0 0  Control/stop worrying 0 0 0 0  Worry too much - different things 0 0 0 0  Trouble relaxing 0 0 0 0  Restless 0 0 0 0  Easily annoyed or irritable 0 0 1 0  Afraid - awful might happen 0 0 0 0  Total GAD 7 Score 0 0 1 0    Upstream - 09/26/22 0836       Pregnancy Intention Screening   Does the patient want to become pregnant in the next year? No    Does the patient's partner want to become pregnant in the next year? No    Would the patient like to discuss  contraceptive options today? No      Contraception Wrap Up   Current Method Oral Contraceptive    End Method Oral Contraceptive    Contraception Counseling Provided Yes            Examination chaperoned by Malachy Mood LPN     Impression and plan: 1. Encounter for gynecological examination with Papanicolaou smear of cervix Pap sent Pap in 3 years if normal Physical in 1 year On aygestin 5 mg has refills  Discussed coping ways with her, with husband's health   - Cytology - PAP( [place])

## 2022-10-04 ENCOUNTER — Other Ambulatory Visit (HOSPITAL_COMMUNITY)
Admission: RE | Admit: 2022-10-04 | Discharge: 2022-10-04 | Disposition: A | Discharge status: Home / Self Care | Payer: 59 | Source: Ambulatory Visit | Attending: Nephrology | Admitting: Nephrology

## 2022-10-04 DIAGNOSIS — N183 Chronic kidney disease, stage 3 unspecified: Secondary | ICD-10-CM | POA: Insufficient documentation

## 2022-10-04 LAB — CBC
HCT: 40.2 % (ref 36.0–46.0)
Hemoglobin: 13.9 g/dL (ref 12.0–15.0)
MCH: 30.8 pg (ref 26.0–34.0)
MCHC: 34.6 g/dL (ref 30.0–36.0)
MCV: 88.9 fL (ref 80.0–100.0)
Platelets: 126 10*3/uL — ABNORMAL LOW (ref 150–400)
RBC: 4.52 MIL/uL (ref 3.87–5.11)
RDW: 13.2 % (ref 11.5–15.5)
WBC: 7.3 10*3/uL (ref 4.0–10.5)
nRBC: 0 % (ref 0.0–0.2)

## 2022-10-04 LAB — RENAL FUNCTION PANEL
Albumin: 4.3 g/dL (ref 3.5–5.0)
Anion gap: 12 (ref 5–15)
BUN: 17 mg/dL (ref 6–20)
CO2: 20 mmol/L — ABNORMAL LOW (ref 22–32)
Calcium: 9.3 mg/dL (ref 8.9–10.3)
Chloride: 105 mmol/L (ref 98–111)
Creatinine, Ser: 1.54 mg/dL — ABNORMAL HIGH (ref 0.44–1.00)
GFR, Estimated: 40 mL/min — ABNORMAL LOW (ref >60)
Glucose, Bld: 114 mg/dL — ABNORMAL HIGH (ref 70–99)
Phosphorus: 4.2 mg/dL (ref 2.5–4.6)
Potassium: 3.5 mmol/L (ref 3.5–5.1)
Sodium: 137 mmol/L (ref 135–145)

## 2022-10-06 LAB — PTH, INTACT AND CALCIUM
Calcium, Total (PTH): 9.7 mg/dL (ref 8.7–10.2)
PTH: 14 pg/mL — ABNORMAL LOW (ref 15–65)

## 2022-10-09 ENCOUNTER — Other Ambulatory Visit (HOSPITAL_COMMUNITY): Payer: Self-pay

## 2022-10-17 ENCOUNTER — Other Ambulatory Visit: Payer: Self-pay | Admitting: Internal Medicine

## 2022-10-18 ENCOUNTER — Other Ambulatory Visit: Payer: Self-pay | Admitting: General Surgery

## 2022-11-09 ENCOUNTER — Other Ambulatory Visit (HOSPITAL_COMMUNITY): Payer: Self-pay

## 2022-12-03 ENCOUNTER — Ambulatory Visit (INDEPENDENT_AMBULATORY_CARE_PROVIDER_SITE_OTHER): Payer: 59 | Admitting: Internal Medicine

## 2022-12-03 ENCOUNTER — Other Ambulatory Visit (HOSPITAL_COMMUNITY): Payer: Self-pay

## 2022-12-03 VITALS — BP 110/70 | HR 92 | Temp 98.0°F | Ht 62.0 in | Wt 141.0 lb

## 2022-12-03 DIAGNOSIS — K50119 Crohn's disease of large intestine with unspecified complications: Secondary | ICD-10-CM | POA: Diagnosis not present

## 2022-12-03 DIAGNOSIS — G8929 Other chronic pain: Secondary | ICD-10-CM

## 2022-12-03 DIAGNOSIS — K50013 Crohn's disease of small intestine with fistula: Secondary | ICD-10-CM

## 2022-12-03 DIAGNOSIS — M5440 Lumbago with sciatica, unspecified side: Secondary | ICD-10-CM

## 2022-12-03 DIAGNOSIS — F32A Depression, unspecified: Secondary | ICD-10-CM

## 2022-12-03 DIAGNOSIS — E538 Deficiency of other specified B group vitamins: Secondary | ICD-10-CM

## 2022-12-03 DIAGNOSIS — N183 Chronic kidney disease, stage 3 unspecified: Secondary | ICD-10-CM

## 2022-12-03 DIAGNOSIS — F902 Attention-deficit hyperactivity disorder, combined type: Secondary | ICD-10-CM | POA: Diagnosis not present

## 2022-12-03 DIAGNOSIS — F419 Anxiety disorder, unspecified: Secondary | ICD-10-CM

## 2022-12-03 MED ORDER — HYDROCODONE-ACETAMINOPHEN 10-325 MG PO TABS
1.0000 | ORAL_TABLET | Freq: Three times a day (TID) | ORAL | 0 refills | Status: DC | PRN
  Filled 2022-12-03 – 2023-01-05 (×4): qty 90, 30d supply, fill #0

## 2022-12-03 MED ORDER — HYDROCODONE-ACETAMINOPHEN 10-325 MG PO TABS
1.0000 | ORAL_TABLET | Freq: Three times a day (TID) | ORAL | 0 refills | Status: DC | PRN
  Filled 2022-12-03 – 2022-12-08 (×2): qty 90, 30d supply, fill #0

## 2022-12-03 NOTE — Assessment & Plan Note (Signed)
Worse Husband was diagnosed with renal cell cancer st 4 in 2024Chronic Cont on Zoloft

## 2022-12-03 NOTE — Assessment & Plan Note (Signed)
Chronic and severe Norco tid prn.  On disabiity  Norco prn. Potential benefits of a long term opioids use as well as potential risks (i.e. addiction risk, apnea etc) and complications (i.e. Somnolence, constipation and others) were explained to the patient and were aknowledged.

## 2022-12-03 NOTE — Progress Notes (Signed)
Subjective:  Patient ID: [name] [name], female    DOB: Sep 04, 1970  Age: 52 y.o. MRN: [id number]  CC: No chief complaint on file.   HPI [name] presents for stress - husband is ill F/u LBP, anxiety, colitis  Outpatient Medications Prior to Visit  Medication Sig Dispense Refill   ALPRAZolam (XANAX) 1 MG tablet Take 1 tablet (1 mg total) by mouth 2 (two) times daily. 30 tablet 2   benazepril (LOTENSIN) 20 MG tablet Take 1 tablet (20 mg total) by mouth daily. 90 tablet 3   clindamycin (CLINDAGEL) 1 % gel Apply topically 2 (two) times daily. 30 g 0   cyanocobalamin (VITAMIN B12) 1000 MCG/ML injection INJECT INTO THE SKIN EVERY 14 DAYS 6 mL 5   doxycycline (VIBRAMYCIN) 50 MG capsule Take 1 capsule (50 mg total) by mouth 2 (two) times daily. 7 capsule 0   HYDROcodone-acetaminophen (NORCO) 10-325 MG tablet Take 1 tablet by mouth 3 (three) times daily as needed for severe pain. 90 tablet 0   lamoTRIgine (LAMICTAL) 200 MG tablet Take 200 mg by mouth daily.      norethindrone (AYGESTIN) 5 MG tablet TAKE 1 TABLET (5 MG TOTAL) BY MOUTH DAILY. 90 tablet 2   pantoprazole (PROTONIX) 40 MG tablet TAKE 1 TABLET BY MOUTH EVERY DAY IN THE MORNING 90 tablet 1   potassium chloride (KLOR-CON) 10 MEQ tablet TAKE 1 TABLET BY MOUTH EVERY DAY 90 tablet 3   sertraline (ZOLOFT) 100 MG tablet Take 200 mg by mouth daily.     SYRINGE-NEEDLE, DISP, 3 ML (B-D SYRINGE/NEEDLE 3CC/25GX5/8) 25G X 5/8" 3 ML MISC Use to administer B12 injection every 14 days 24 each 0   VITAMIN D PO Take by mouth.     HYDROcodone-acetaminophen (NORCO) 10-325 MG tablet Take 1 tablet by mouth 3 times daily as needed for severe pain. 90 tablet 0   HYDROcodone-acetaminophen (NORCO) 10-325 MG tablet Take 1 tablet by mouth 3 times daily as needed. 90 tablet 0   No facility-administered medications prior to visit.    ROS: Review of Systems  Constitutional:  Negative for activity change, appetite change, chills, fatigue and  unexpected weight change.  HENT:  Negative for congestion, mouth sores and sinus pressure.   Eyes:  Negative for visual disturbance.  Respiratory:  Negative for cough and chest tightness.   Gastrointestinal:  Negative for abdominal pain and nausea.  Genitourinary:  Negative for difficulty urinating, frequency and vaginal pain.  Musculoskeletal:  Positive for back pain. Negative for gait problem.  Skin:  Negative for pallor and rash.  Neurological:  Negative for dizziness, tremors, weakness, numbness and headaches.  Psychiatric/Behavioral:  Negative for confusion, sleep disturbance and suicidal ideas. The patient is nervous/anxious.     Objective:  BP 110/70 (BP Location: Right Arm, Patient Position: Sitting, Cuff Size: Normal)   Pulse 92   Temp 98 F (36.7 C) (Oral)   Ht 5\' 2"  (1.575 m)   Wt 141 lb (64 kg)   SpO2 97%   BMI 25.79 kg/m   BP Readings from Last 3 Encounters:  12/03/22 110/70  09/26/22 128/76  09/19/22 (!) 142/73    Wt Readings from Last 3 Encounters:  12/03/22 141 lb (64 kg)  09/26/22 148 lb 8 oz (67.4 kg)  09/19/22 151 lb (68.5 kg)    Physical Exam Constitutional:      General: She is not in acute distress.    Appearance: She is well-developed.  HENT:  Head: Normocephalic.     Right Ear: External ear normal.     Left Ear: External ear normal.     Nose: Nose normal.  Eyes:     General:        Right eye: No discharge.        Left eye: No discharge.     Conjunctiva/sclera: Conjunctivae normal.     Pupils: Pupils are equal, round, and reactive to light.  Neck:     Thyroid: No thyromegaly.     Vascular: No JVD.     Trachea: No tracheal deviation.  Cardiovascular:     Rate and Rhythm: Regular rhythm.     Heart sounds: Murmur heard.  Pulmonary:     Effort: No respiratory distress.     Breath sounds: No stridor. No wheezing.  Abdominal:     General: Bowel sounds are normal. There is no distension.     Palpations: Abdomen is soft. There is no  mass.     Tenderness: There is no abdominal tenderness. There is no guarding or rebound.  Musculoskeletal:        General: Tenderness present.     Cervical back: Normal range of motion and neck supple. No rigidity.  Lymphadenopathy:     Cervical: No cervical adenopathy.  Skin:    Findings: No erythema or rash.  Neurological:     Cranial Nerves: No cranial nerve deficit.     Motor: No abnormal muscle tone.     Coordination: Coordination normal.     Deep Tendon Reflexes: Reflexes normal.  Psychiatric:        Behavior: Behavior normal.        Thought Content: Thought content normal.        Judgment: Judgment normal.   LS w/pain  Lab Results  Component Value Date   WBC 7.3 10/04/2022   HGB 13.9 10/04/2022   HCT 40.2 10/04/2022   PLT 126 (L) 10/04/2022   GLUCOSE 114 (H) 10/04/2022   CHOL 221 (H) 08/28/2022   TRIG 210.0 (H) 08/28/2022   HDL 31.00 (L) 08/28/2022   LDLDIRECT 169.0 08/28/2022   LDLCALC 121 (H) 08/30/2014   ALT 11 08/28/2022   AST 12 08/28/2022   NA 137 10/04/2022   K 3.5 10/04/2022   CL 105 10/04/2022   CREATININE 1.54 (H) 10/04/2022   BUN 17 10/04/2022   CO2 20 (L) 10/04/2022   TSH 1.86 08/28/2022   INR 1.1 12/05/2020   HGBA1C 4.9 10/11/2021    No results found.  Assessment & Plan:   Problem List Items Addressed This Visit     B12 deficiency - Primary    Chronic - continue on B12 inj q 2 wks      Anxiety    Worse Husband was diagnosed with renal cell cancer st 4 in 2024      Depression    Worse Husband was diagnosed with renal cell cancer st 4 in 2024Chronic Cont on Zoloft      Attention deficit hyperactivity disorder (ADHD)   RESOLVED: Crohn disease (HCC)    Demerol po rare for severe pain   Potential benefits of a long term opioids use as well as potential risks (i.e. addiction risk, apnea etc) and complications (i.e. Somnolence, constipation and others) were explained to the patient and were aknowledged.      Relevant Orders   CBC  with Differential/Platelet   Comprehensive metabolic panel   TSH   LOW BACK PAIN, CHRONIC    Chronic and  severe Norco tid prn.  On disabiity  Norco prn. Potential benefits of a long term opioids use as well as potential risks (i.e. addiction risk, apnea etc) and complications (i.e. Somnolence, constipation and others) were explained to the patient and were aknowledged.      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    Dispense:  90 tablet    Refill:  0    Please fill on or after 12/03/2022   HYDROcodone-acetaminophen (NORCO) 10-325 MG tablet    Sig: Take 1 tablet by mouth 3 times daily as needed.    Dispense:  90 tablet    Refill:  0    Please fill on or after 01/02/2023      Follow-up: Return in about 2 months (around 02/02/2023) for a follow-up visit.  Sonda Primes, MD

## 2022-12-03 NOTE — Assessment & Plan Note (Signed)
Chronic - continue on B12 inj q 2 wks

## 2022-12-03 NOTE — Assessment & Plan Note (Signed)
Demerol po rare for severe pain   Potential benefits of a long term opioids use as well as potential risks (i.e. addiction risk, apnea etc) and complications (i.e. Somnolence, constipation and others) were explained to the patient and were aknowledged.

## 2022-12-03 NOTE — Assessment & Plan Note (Signed)
GFR 36 No NSAIDs

## 2022-12-03 NOTE — Assessment & Plan Note (Signed)
Stable

## 2022-12-03 NOTE — Assessment & Plan Note (Signed)
Worse Husband was diagnosed with renal cell cancer st 4 in 2024

## 2022-12-04 ENCOUNTER — Other Ambulatory Visit (HOSPITAL_COMMUNITY): Payer: Self-pay

## 2022-12-07 ENCOUNTER — Other Ambulatory Visit (HOSPITAL_COMMUNITY): Payer: Self-pay

## 2022-12-08 ENCOUNTER — Other Ambulatory Visit (HOSPITAL_COMMUNITY): Payer: Self-pay

## 2023-01-01 ENCOUNTER — Telehealth (INDEPENDENT_AMBULATORY_CARE_PROVIDER_SITE_OTHER): Payer: 59 | Admitting: Internal Medicine

## 2023-01-01 ENCOUNTER — Encounter: Payer: Self-pay | Admitting: Internal Medicine

## 2023-01-01 DIAGNOSIS — D696 Thrombocytopenia, unspecified: Secondary | ICD-10-CM | POA: Diagnosis not present

## 2023-01-01 DIAGNOSIS — E538 Deficiency of other specified B group vitamins: Secondary | ICD-10-CM | POA: Diagnosis not present

## 2023-01-01 DIAGNOSIS — M2559 Pain in other specified joint: Secondary | ICD-10-CM | POA: Diagnosis not present

## 2023-01-01 MED ORDER — DICLOFENAC SODIUM 1 % EX GEL: 1.0000 | Freq: Four times a day (QID) | CUTANEOUS | 3 refills | Status: DC

## 2023-01-01 MED ORDER — PREDNISONE 10 MG PO TABS: ORAL_TABLET | ORAL | 1 refills | Status: DC

## 2023-01-01 NOTE — Assessment & Plan Note (Signed)
On B12 

## 2023-01-01 NOTE — Assessment & Plan Note (Signed)
Check CBC 

## 2023-01-01 NOTE — Progress Notes (Signed)
Virtual Visit via Video Note  I connected with [name] [name] on 01/01/23 at 11:20 AM EST by a video enabled telemedicine application and verified that I am speaking with the correct person using two identifiers.   I discussed the limitations of evaluation and management by telemedicine and the availability of in person appointments. The patient expressed understanding and agreed to proceed.  I was located at our [employer] office. The patient was at home. There was no one else present in the visit.  No chief complaint on file.    History of Present Illness:  C/o knee pain B and the L elbow pain starting 2 wks ago. She saw ortho, had X rays and given Prednisone taper. She got better, now the sx's are bad again. The joints are hot.  Then she saw her renal doctor - advised to get labs done   Review of Systems  Eyes:  Negative for blurred vision.  Cardiovascular:  Negative for palpitations.  Gastrointestinal:  Negative for nausea.  Genitourinary:  Negative for urgency.  Musculoskeletal:  Positive for back pain, joint pain and myalgias. Negative for neck pain.  Neurological:  Negative for weakness.     Observations/Objective: The patient appears to be in no acute distress  Assessment and Plan:  Problem List Items Addressed This Visit     B12 deficiency    On B12      Arthralgia - Primary    C/o knee pain B and the L elbow pain starting 2 wks ago. She saw ortho, had X rays and given Prednisone taper. She got better, now the sx's are bad again. The joints are hot.  Then she saw her renal doctor - advised to get labs done Labs - Uric acid, ANA, RF Prednisone 10 mg: take 4 tabs a day x 3 days; then 3 tabs a day x 4 days; then 2 tabs a day x 4 days, then 1 tab a day x 6 days, then stop. Take pc.      Relevant Orders   CK   CBC with Differential/Platelet   Comprehensive metabolic panel   VITAMIN D 25 Hydroxy (Vit-D Deficiency, Fractures)   Urinalysis   Uric acid   TSH    T4, free   Rheumatoid factor   Sedimentation rate   ANA   Thrombocytopenia (HCC)    Check CBC      Relevant Orders   CK   CBC with Differential/Platelet   Comprehensive metabolic panel   VITAMIN D 25 Hydroxy (Vit-D Deficiency, Fractures)   Urinalysis   Uric acid   TSH   T4, free   Rheumatoid factor   Sedimentation rate   ANA     Meds ordered this encounter  Medications   predniSONE (DELTASONE) 10 MG tablet    Sig: Prednisone 10 mg: take 4 tabs a day x 3 days; then 3 tabs a day x 4 days; then 2 tabs a day x 4 days, then 1 tab a day x 6 days, then stop. Take pc.    Dispense:  38 tablet    Refill:  1   diclofenac Sodium (VOLTAREN) 1 % GEL    Sig: Apply 1 Application topically 4 (four) times daily.    Dispense:  100 g    Refill:  3     Follow Up Instructions:    I discussed the assessment and treatment plan with the patient. The patient was provided an opportunity to ask questions and all were answered. The  patient agreed with the plan and demonstrated an understanding of the instructions.   The patient was advised to call back or seek an in-person evaluation if the symptoms worsen or if the condition fails to improve as anticipated.  I provided face-to-face time during this encounter. We were at different locations.   Sonda Primes, MD

## 2023-01-01 NOTE — Assessment & Plan Note (Signed)
C/o knee pain B and the L elbow pain starting 2 wks ago. She saw ortho, had X rays and given Prednisone taper. She got better, now the sx's are bad again. The joints are hot.  Then she saw her renal doctor - advised to get labs done Labs - Uric acid, ANA, RF Prednisone 10 mg: take 4 tabs a day x 3 days; then 3 tabs a day x 4 days; then 2 tabs a day x 4 days, then 1 tab a day x 6 days, then stop. Take pc.

## 2023-01-03 ENCOUNTER — Other Ambulatory Visit (HOSPITAL_COMMUNITY)
Admission: RE | Admit: 2023-01-03 | Discharge: 2023-01-03 | Disposition: A | Discharge status: Home / Self Care | Payer: 59 | Source: Ambulatory Visit | Attending: Internal Medicine | Admitting: Internal Medicine

## 2023-01-03 ENCOUNTER — Other Ambulatory Visit (HOSPITAL_COMMUNITY): Payer: Self-pay

## 2023-01-03 DIAGNOSIS — D696 Thrombocytopenia, unspecified: Secondary | ICD-10-CM | POA: Insufficient documentation

## 2023-01-03 DIAGNOSIS — M2559 Pain in other specified joint: Secondary | ICD-10-CM | POA: Diagnosis present

## 2023-01-03 DIAGNOSIS — K50119 Crohn's disease of large intestine with unspecified complications: Secondary | ICD-10-CM | POA: Diagnosis present

## 2023-01-03 LAB — URINALYSIS, COMPLETE (UACMP) WITH MICROSCOPIC
Bacteria, UA: NONE SEEN
Bilirubin Urine: NEGATIVE
Glucose, UA: NEGATIVE mg/dL
Ketones, ur: NEGATIVE mg/dL
Nitrite: NEGATIVE
Protein, ur: NEGATIVE mg/dL
Specific Gravity, Urine: 1.018 (ref 1.005–1.030)
pH: 5 (ref 5.0–8.0)

## 2023-01-03 LAB — CBC WITH DIFFERENTIAL/PLATELET
Abs Immature Granulocytes: 0.02 10*3/uL (ref 0.00–0.07)
Basophils Absolute: 0 10*3/uL (ref 0.0–0.1)
Basophils Relative: 1 %
Eosinophils Absolute: 0 10*3/uL (ref 0.0–0.5)
Eosinophils Relative: 1 %
HCT: 41.5 % (ref 36.0–46.0)
Hemoglobin: 13.8 g/dL (ref 12.0–15.0)
Immature Granulocytes: 0 %
Lymphocytes Relative: 19 %
Lymphs Abs: 1.5 10*3/uL (ref 0.7–4.0)
MCH: 30 pg (ref 26.0–34.0)
MCHC: 33.3 g/dL (ref 30.0–36.0)
MCV: 90.2 fL (ref 80.0–100.0)
Monocytes Absolute: 0.3 10*3/uL (ref 0.1–1.0)
Monocytes Relative: 4 %
Neutro Abs: 5.9 10*3/uL (ref 1.7–7.7)
Neutrophils Relative %: 75 %
Platelets: 109 10*3/uL — ABNORMAL LOW (ref 150–400)
RBC: 4.6 MIL/uL (ref 3.87–5.11)
RDW: 13.5 % (ref 11.5–15.5)
WBC: 7.8 10*3/uL (ref 4.0–10.5)
nRBC: 0 % (ref 0.0–0.2)

## 2023-01-03 LAB — T4, FREE: Free T4: 0.77 ng/dL (ref 0.61–1.12)

## 2023-01-03 LAB — COMPREHENSIVE METABOLIC PANEL
ALT: 9 U/L (ref 0–44)
AST: 10 U/L — ABNORMAL LOW (ref 15–41)
Albumin: 3.9 g/dL (ref 3.5–5.0)
Alkaline Phosphatase: 50 U/L (ref 38–126)
Anion gap: 11 (ref 5–15)
BUN: 22 mg/dL — ABNORMAL HIGH (ref 6–20)
CO2: 23 mmol/L (ref 22–32)
Calcium: 8.9 mg/dL (ref 8.9–10.3)
Chloride: 103 mmol/L (ref 98–111)
Creatinine, Ser: 1.51 mg/dL — ABNORMAL HIGH (ref 0.44–1.00)
GFR, Estimated: 41 mL/min — ABNORMAL LOW (ref >60)
Glucose, Bld: 105 mg/dL — ABNORMAL HIGH (ref 70–99)
Potassium: 3.9 mmol/L (ref 3.5–5.1)
Sodium: 137 mmol/L (ref 135–145)
Total Bilirubin: 0.7 mg/dL (ref <1.2)
Total Protein: 6.4 g/dL — ABNORMAL LOW (ref 6.5–8.1)

## 2023-01-03 LAB — TSH: TSH: 1.469 u[IU]/mL (ref 0.350–4.500)

## 2023-01-03 LAB — SEDIMENTATION RATE: Sed Rate: 19 mm/h (ref 0–22)

## 2023-01-03 LAB — CK: Total CK: 22 U/L — ABNORMAL LOW (ref 38–234)

## 2023-01-03 LAB — URIC ACID: Uric Acid, Serum: 5.1 mg/dL (ref 2.5–7.1)

## 2023-01-03 LAB — VITAMIN D 25 HYDROXY (VIT D DEFICIENCY, FRACTURES): Vit D, 25-Hydroxy: 60.79 ng/mL (ref 30–100)

## 2023-01-04 ENCOUNTER — Other Ambulatory Visit: Payer: Self-pay

## 2023-01-04 ENCOUNTER — Other Ambulatory Visit (HOSPITAL_COMMUNITY): Payer: Self-pay

## 2023-01-04 LAB — RHEUMATOID FACTOR: Rheumatoid fact SerPl-aCnc: <10 [IU]/mL (ref <14.0)

## 2023-01-04 LAB — ANA: Anti Nuclear Antibody (ANA): NEGATIVE

## 2023-01-05 ENCOUNTER — Other Ambulatory Visit (HOSPITAL_COMMUNITY): Payer: Self-pay

## 2023-01-28 ENCOUNTER — Encounter: Payer: Self-pay | Admitting: Internal Medicine

## 2023-01-29 ENCOUNTER — Ambulatory Visit: Payer: 59 | Admitting: Internal Medicine

## 2023-02-03 ENCOUNTER — Other Ambulatory Visit: Payer: Self-pay | Admitting: Internal Medicine

## 2023-02-05 ENCOUNTER — Encounter: Payer: Self-pay | Admitting: Internal Medicine

## 2023-02-05 ENCOUNTER — Telehealth: Payer: Self-pay | Admitting: Internal Medicine

## 2023-02-05 ENCOUNTER — Other Ambulatory Visit (HOSPITAL_COMMUNITY): Payer: Self-pay

## 2023-02-05 NOTE — Telephone Encounter (Signed)
Prescription Request  02/05/2023  LOV: 12/03/2022  What is the name of the medication or equipment? HYDROcodone-acetaminophen (NORCO) 10-325 MG tablet [[id number]]   Have you contacted your pharmacy to request a refill? Yes   Which pharmacy would you like this sent to?  [facility] [phone] - [place], [place] - [address] AT [address] [place] [address] Phone: [phone] Fax: [phone]  l staff for review and that they should receive a response within 2 business days.   Please advise at Mobile [phone] (mobile)

## 2023-02-06 MED ORDER — HYDROCODONE-ACETAMINOPHEN 10-325 MG PO TABS: 1.0000 | ORAL_TABLET | Freq: Three times a day (TID) | ORAL | 0 refills | Status: DC | PRN

## 2023-02-09 ENCOUNTER — Other Ambulatory Visit: Payer: Self-pay | Admitting: Internal Medicine

## 2023-02-09 MED ORDER — HYDROCODONE-ACETAMINOPHEN 10-325 MG PO TABS: 1.0000 | ORAL_TABLET | Freq: Three times a day (TID) | ORAL | 0 refills | Status: DC | PRN

## 2023-02-15 ENCOUNTER — Ambulatory Visit: Payer: Self-pay | Admitting: Internal Medicine

## 2023-02-15 ENCOUNTER — Telehealth: Payer: Self-pay | Admitting: Internal Medicine

## 2023-02-15 NOTE — Telephone Encounter (Signed)
Patient is wanting to see if she can get a mri done today while she is at [employer] with her husband for her appt she would like a call back regarding this

## 2023-02-15 NOTE — Telephone Encounter (Signed)
I was able to speak with the pt an d inform her due to recent head injury incident and the sxs or occur afterwards pt is to report to the ER ASAP as she is going to need imaging done and other test to determine a cause if possible for the blacking out episode.   Pt states she is going to [facility] and wanted Dr. Posey Rea to order her MRI for the ED and I was able to inform the pt that Dr. Posey Rea can not do so as he does not work at hospital nor on Friday's and that the provider's in the ED will handle all order's of labs and imaging.  Pt seemed upset that the above could not be done by her provider.

## 2023-02-15 NOTE — Telephone Encounter (Signed)
Copied from CRM [id number]. Topic: Appointments - Appointment Scheduling >> Feb 15, 2023  7:59 AM Judeth Cornfield R wrote: Extreme headaches with very severe pain. PT hit her head on 12/25 and did black out   Chief Complaint: Head Injury Symptoms: loss of consciousness, nausea and vomiting Frequency: constant Pertinent Negatives: Patient denies bleeding or brusies Disposition: [x] ED /[] Urgent Care (no appt availability in office) / [] Appointment(In office/virtual)/ []  [facility]/ [] Home Care/ [] Refused Recommended Disposition /[] [facility]/ []  Follow-up with PCP Additional Notes: Patient reports slip and fall on 12/25. Sts that she landed on her back and hit the back of her head on a grazzy area. Pt sts that she loss consciousness, but unsure of how long she was out. Pt sts that she when she came to she vomited as well. Pt reports dizziness and feeling "foggy".  RN advised pt to go to ED per protocol, but pt is requesting to speak with her PCP before deciding to go.       Reason for Disposition  [1] Knocked out (unconscious) < 1 minute AND [2] now fine  Answer Assessment - Initial Assessment Questions 1. MECHANISM: "How did the injury happen?" For falls, ask: "What height did you fall from?" and "What surface did you fall against?"      Slip and fall outside landed on back and hit back of head on grass  2. ONSET: "When did the injury happen?" (Minutes or hours ago)      2 days ago  3. NEUROLOGIC SYMPTOMS: "Was there any loss of consciousness?" "Are there any other neurological symptoms?"      Lost consciousness and was in a "daze" upon waking up  4. MENTAL STATUS: "Does the person know who they are, who you are, and where they are?"      Yes  5. LOCATION: "What part of the head was hit?"      Back of head that was hit  6. SCALP APPEARANCE: "What does the scalp look like? Is it bleeding now?" If Yes, ask: "Is it difficult to stop?"      No bleeding  7. SIZE: For  cuts, bruises, or swelling, ask: "How large is it?" (e.g., inches or centimeters)      No cuts or bruises  8. PAIN: "Is there any pain?" If Yes, ask: "How bad is it?"  (e.g., Scale 1-10; or mild, moderate, severe)     Moderate to severe pain  9. TETANUS: For any breaks in the skin, ask: "When was the last tetanus booster?"     N/a  10. OTHER SYMPTOMS: "Do you have any other symptoms?" (e.g., neck pain, vomiting)       Nausea, Vomiting, neck pain, pain in both arms, brain fog  Protocols used: Head Injury-A-AH

## 2023-02-18 NOTE — Telephone Encounter (Signed)
I agree with the recommendation to go to ER.  Thank you

## 2023-02-28 ENCOUNTER — Ambulatory Visit: Payer: 59 | Admitting: Internal Medicine

## 2023-03-03 NOTE — ED Provider Notes (Signed)
 Emergency Department Provider Note Room: 07/07  Medical Decision Making   [name] [name] is a 53 y.o. female with history of hidradenitis of the left axilla, Crohn's amongst other chronic medical issues presenting with concern for left arm pain and infection.  Patient reports that she has had ongoing symptoms for about 2 weeks now.  She reports some pain shooting down her left arm.  She denies any fever with her symptoms.  She has not had any joint swelling or redness generally of the left arm.  No recent trauma to the left arm.  She has seen her primary care physician for this issue and was prescribed doxycycline  as well as steroids.  She finished this course recently.  She reports continuing symptoms.  She is here primarily because she has a history of hidradenitis of the left axilla which produced soft tissue infection of the left arm and sepsis in the past.  She is concerned about this.  On my evaluation in the emergency room patient is very well-appearing awake alert oriented in no acute distress, is nontoxic-appearing.  She has reassuring vital signs is afebrile nontachycardic hemodynamically stable.  On examination of her left arm she has a strong left radial pulse and good cap refill in the left arm with no swelling of left arm relative to right.  She is fully motor and sensory intact in left arm.  She has no redness of the left arm, no joint swelling or reduced range of motion of any joints or pain with range of motion of any joints.  Her left axilla has some scars though otherwise no signs of abscesses or abscesses generally on the left upper extremity.  Generally reassuring exam.  Patient's symptoms seem consistent with neuropathy rather than infection.  I have no suspicion for NSTI given her reassuring vital signs and exam.  I also have no sedation for septic joint given good range of motion of all joints with no swelling of any joints.  There are no signs of abscess on exam  either.  Patient reports her symptoms seem very much like when she had systemic infection secondary to soft tissue infection of left upper extremity and is very concerned about this.  Her clinical exam is not altogether consistent with a cellulitis though given she thinks her symptoms are very similar I did have a shared decision-making conversation with her regarding other course of antibiotics.  She unfortunately is multiple allergies, has been able to tolerate clindamycin  in the past just with some diarrhea and is amenable to trying this medication given she cannot tolerate sulfas or cephalosporins.  Through shared decision making we decided on a course of clindamycin .  She is happy and comfortable with this plan.  Will obtain lab work and blood cultures to be safe.  Patient requesting a dose of IV antibiotics here.  I did again discussed with her that antibiotics administered through her IV are not any more indicated than oral antibiotics given her presenting vital signs and exam though given she very much prefers this I think is reasonable.  Will reevaluate the patient after lab work.   Progress Notes  10:53 PM: Lab work generally reassuring with no white blood cell count elevation.  Patient does have CKD and creatinine in line with prior.  Patient very well-appearing on reevaluation.  Will plan for discharge and close primary care follow-up.    ___  External Records Reviewed: Office visit from 12/03/2022 showing history of Crohn's disease Escalation of  Care including OBS/Admission/Transfer was considered:: However, patient was determined to be appropriate for outpatient management. See progress note for additional detail.  Diagnostic Tests Considered But Not Performed: Considered an ultrasound of patient's left upper extremity as well as CT though she has no swelling of the extremity, strong radial pulse.  Therefore very low sufficient for vascular compromise  Disposition   Clinical Impression:   Final diagnoses:  Pain of left upper extremity (Primary)    Final Disposition: Discharge   History   Chief Complaint:  Arm Pain    HPI:  Delta Pichon is a 53 y.o. female with history of hidradenitis of the left axilla, Crohn's amongst other chronic medical issues presenting with concern for left arm pain and infection.  Patient reports that she has had ongoing symptoms for about 2 weeks now.  She reports some pain shooting down her left arm.  She denies any fever with her symptoms.  She has not had any joint swelling or redness generally of the left arm.  No recent trauma to the left arm.  She has seen her primary care physician for this issue and was prescribed doxycycline  as well as steroids.  She finished this course recently.  She reports continuing symptoms.  She is here primarily because she has a history of hidradenitis of the left axilla which produced soft tissue infection of the left arm and sepsis in the past.  She is concerned about this.  MEDICATIONS:  Patient's Medications   No medications on file    ALLERGIES:  Allergies  Allergen Reactions  . Clindamycin  Diarrhea  . Keflex [Cephalexin] Hives  . Sulfa (Sulfonamide Antibiotics) Nausea And Vomiting    PAST MEDICAL HISTORY:  No past medical history on file.  PAST SURGICAL HISTORY: No past surgical history on file.  SOCIAL HISTORY:  Social History   Tobacco Use  . Smoking status: Not on file  . Smokeless tobacco: Not on file  Substance Use Topics  . Alcohol use: Not on file    FAMILY HISTORY:  No family history on file.   Physical Exam   Vitals:   03/03/23 2131  BP: 144/80  Pulse: 83  Resp: 16  Temp: 36.3 C (97.4 F)  SpO2: 98%    Constitutional: Alert and oriented. Well appearing. In no distress. Eyes: Conjunctivae are normal. ENT      Head: Normocephalic and atraumatic.      Nose: No congestion.      Mouth/Throat: Mucous membranes are moist.      Neck: No  stridor. Cardiovascular: rate as vital signs, regular rhythm. Normal and symmetric distal pulses are present in all extremities. Respiratory: Normal respiratory effort, symmetrical expansion of chest. No wheezes, no rales. GI/GU: Soft and nontender. No rigid abdomen present. Musculoskeletal: Normal ROM all joints Neurologic: Normal speech and language. No gross focal neurologic deficits are appreciated relative to baseline. Skin: Skin is warm, dry and intact. No rash noted. Psychiatric: Mood and affect are normal. Speech and behavior are normal.   Results   Pertinent labs & imaging results that were available during my care of the patient were reviewed by me and considered in my medical decision making (see chart for details).  Results for orders placed or performed during the hospital encounter of 03/03/23  Comprehensive metabolic panel  Result Value Ref Range   Sodium 147 (H) 135 - 145 mmol/L   Potassium 3.6 3.5 - 5.0 mmol/L   Chloride 109 (H) 98 - 107 mmol/L  CO2 29.6 21.0 - 32.0 mmol/L   Anion Gap 8 3 - 11 mmol/L   BUN 23 (H) 8 - 20 mg/dL   Creatinine 8.33 (H) 9.39 - 1.10 mg/dL   BUN/Creatinine Ratio 14    eGFR CKD-EPI (2021) Female 37 (L) >=60 mL/min/1.15m2   Glucose 93 70 - 179 mg/dL   Calcium 8.5 8.5 - 89.8 mg/dL   Albumin 4.0 3.5 - 5.0 g/dL   Total Protein 7.0 6.0 - 8.0 g/dL   Total Bilirubin 0.3 0.3 - 1.2 mg/dL   AST 7 (L) 15 - 40 U/L   ALT 13 12 - 78 U/L   Alkaline Phosphatase 66 46 - 116 U/L  CBC w/ Differential  Result Value Ref Range   WBC 8.1 4.0 - 10.5 10*9/L   RBC 4.53 3.80 - 5.10 10*12/L   HGB 13.8 11.5 - 15.0 g/dL   HCT 59.5 65.9 - 55.9 %   MCV 89.2 80.0 - 98.0 fL   MCH 30.5 27.0 - 34.0 pg   MCHC 34.2 32.0 - 36.0 g/dL   RDW 86.5 88.4 - 85.4 %   MPV 9.6 7.4 - 10.4 fL   Platelet 106 (L) 140 - 415 10*9/L   Neutrophils % 69.8 %   Lymphocytes % 25.2 %   Monocytes % 4.1 %   Eosinophils % 0.2 %   Basophils % 0.5 %   Absolute Neutrophils 5.7 1.8 - 7.8  10*9/L   Absolute Lymphocytes 2.1 0.7 - 4.5 10*9/L   Absolute Monocytes 0.3 0.1 - 1.0 10*9/L   Absolute Eosinophils 0.0 0.0 - 0.4 10*9/L   Absolute Basophils 0.0 0.0 - 0.2 10*9/L   No orders to display   No results found for this visit on 03/03/23 (from the past 4464 hours).         Decenso, Brendan M, MD 03/04/23 [phone]

## 2023-03-12 ENCOUNTER — Other Ambulatory Visit: Payer: Self-pay | Admitting: Internal Medicine

## 2023-03-15 ENCOUNTER — Other Ambulatory Visit: Payer: Self-pay | Admitting: Internal Medicine

## 2023-03-20 ENCOUNTER — Ambulatory Visit: Payer: 59 | Admitting: Internal Medicine

## 2023-03-29 DIAGNOSIS — F17210 Nicotine dependence, cigarettes, uncomplicated: Secondary | ICD-10-CM | POA: Insufficient documentation

## 2023-04-02 ENCOUNTER — Ambulatory Visit: Payer: 59 | Admitting: Internal Medicine

## 2023-04-02 ENCOUNTER — Encounter: Payer: Self-pay | Admitting: Internal Medicine

## 2023-04-02 VITALS — BP 122/82 | HR 113 | Temp 98.0°F | Ht 62.0 in | Wt 143.0 lb

## 2023-04-02 DIAGNOSIS — M5440 Lumbago with sciatica, unspecified side: Secondary | ICD-10-CM

## 2023-04-02 DIAGNOSIS — G8929 Other chronic pain: Secondary | ICD-10-CM

## 2023-04-02 DIAGNOSIS — E559 Vitamin D deficiency, unspecified: Secondary | ICD-10-CM

## 2023-04-02 DIAGNOSIS — E538 Deficiency of other specified B group vitamins: Secondary | ICD-10-CM

## 2023-04-02 DIAGNOSIS — K50013 Crohn's disease of small intestine with fistula: Secondary | ICD-10-CM | POA: Diagnosis not present

## 2023-04-02 LAB — CBC WITH DIFFERENTIAL/PLATELET
Basophils Absolute: 0 10*3/uL (ref 0.0–0.1)
Basophils Relative: 0.3 % (ref 0.0–3.0)
Eosinophils Absolute: 0.1 10*3/uL (ref 0.0–0.7)
Eosinophils Relative: 0.6 % (ref 0.0–5.0)
HCT: 42.5 % (ref 36.0–46.0)
Hemoglobin: 14.6 g/dL (ref 12.0–15.0)
Lymphocytes Relative: 15.9 % (ref 12.0–46.0)
Lymphs Abs: 1.5 10*3/uL (ref 0.7–4.0)
MCHC: 34.3 g/dL (ref 30.0–36.0)
MCV: 88.8 fL (ref 78.0–100.0)
Monocytes Absolute: 0.4 10*3/uL (ref 0.1–1.0)
Monocytes Relative: 4.3 % (ref 3.0–12.0)
Neutro Abs: 7.5 10*3/uL (ref 1.4–7.7)
Neutrophils Relative %: 78.9 % — ABNORMAL HIGH (ref 43.0–77.0)
Platelets: 144 10*3/uL — ABNORMAL LOW (ref 150.0–400.0)
RBC: 4.78 Mil/uL (ref 3.87–5.11)
RDW: 13.4 % (ref 11.5–15.5)
WBC: 9.5 10*3/uL (ref 4.0–10.5)

## 2023-04-02 LAB — COMPREHENSIVE METABOLIC PANEL
ALT: 8 U/L (ref 0–35)
AST: 11 U/L (ref 0–37)
Albumin: 4.7 g/dL (ref 3.5–5.2)
Alkaline Phosphatase: 64 U/L (ref 39–117)
BUN: 18 mg/dL (ref 6–23)
CO2: 28 meq/L (ref 19–32)
Calcium: 9.5 mg/dL (ref 8.4–10.5)
Chloride: 105 meq/L (ref 96–112)
Creatinine, Ser: 1.38 mg/dL — ABNORMAL HIGH (ref 0.40–1.20)
GFR: 43.76 mL/min — ABNORMAL LOW (ref >60.00)
Glucose, Bld: 116 mg/dL — ABNORMAL HIGH (ref 70–99)
Potassium: 4 meq/L (ref 3.5–5.1)
Sodium: 142 meq/L (ref 135–145)
Total Bilirubin: 0.5 mg/dL (ref 0.2–1.2)
Total Protein: 7.3 g/dL (ref 6.0–8.3)

## 2023-04-02 MED ORDER — HYDROCODONE-ACETAMINOPHEN 10-325 MG PO TABS: 1.0000 | ORAL_TABLET | Freq: Three times a day (TID) | ORAL | 0 refills | Status: DC | PRN

## 2023-04-02 MED ORDER — KETOROLAC TROMETHAMINE 10 MG PO TABS: 10.0000 mg | ORAL_TABLET | Freq: Four times a day (QID) | ORAL | 0 refills | Status: DC | PRN

## 2023-04-02 MED ORDER — CIPROFLOXACIN HCL 500 MG PO TABS: 500.0000 mg | ORAL_TABLET | Freq: Two times a day (BID) | ORAL | 0 refills | Status: DC

## 2023-04-02 NOTE — Assessment & Plan Note (Signed)
On Vit D

## 2023-04-02 NOTE — Assessment & Plan Note (Signed)
Chronic and severe Norco tid prn.  On disabiity  Norco prn. Potential benefits of a long term opioids use as well as potential risks (i.e. addiction risk, apnea etc) and complications (i.e. Somnolence, constipation and others) were explained to the patient and were aknowledged.

## 2023-04-02 NOTE — Progress Notes (Signed)
Subjective:  Patient ID: [name] [name], female    DOB: 12/07/1970  Age: 53 y.o. MRN: [id number]  CC: No chief complaint on file.   HPI [name] [name] presents for LBP, HTN, GERD  Outpatient Medications Prior to Visit  Medication Sig Dispense Refill   ALPRAZolam (XANAX) 1 MG tablet Take 1 tablet (1 mg total) by mouth 2 (two) times daily. 30 tablet 2   benazepril (LOTENSIN) 20 MG tablet Take 1 tablet (20 mg total) by mouth daily. 90 tablet 3   cyanocobalamin (VITAMIN B12) 1000 MCG/ML injection INJECT INTO THE SKIN EVERY 14 DAYS 6 mL 5   diclofenac Sodium (VOLTAREN) 1 % GEL Apply 1 Application topically 4 (four) times daily. 100 g 3   lamoTRIgine (LAMICTAL) 200 MG tablet Take 200 mg by mouth daily.      norethindrone (AYGESTIN) 5 MG tablet TAKE 1 TABLET (5 MG TOTAL) BY MOUTH DAILY. 90 tablet 2   pantoprazole (PROTONIX) 40 MG tablet TAKE 1 TABLET BY MOUTH EVERY DAY IN THE MORNING 90 tablet 1   potassium chloride (KLOR-CON) 10 MEQ tablet TAKE 1 TABLET BY MOUTH EVERY DAY 90 tablet 3   predniSONE (DELTASONE) 10 MG tablet TAKE 4 TABLETS BY MOUTH ONCE A DAY FOR3 DAYS, 3 TABS A DAY FOR 4 DAYS, 2 TABS A DAY FOR 4 DAY, THEN 1 TAB A DAY FOR 6 DAYS THEN STOP 38 tablet 1   sertraline (ZOLOFT) 100 MG tablet Take 200 mg by mouth daily.     SYRINGE-NEEDLE, DISP, 3 ML (B-D SYRINGE/NEEDLE 3CC/25GX5/8) 25G X 5/8" 3 ML MISC Use to administer B12 injection every 14 days 24 each 0   VITAMIN D PO Take by mouth.     clindamycin (CLINDAGEL) 1 % gel Apply topically 2 (two) times daily. 30 g 0   doxycycline (VIBRAMYCIN) 50 MG capsule Take 1 capsule (50 mg total) by mouth 2 (two) times daily. 7 capsule 0   HYDROcodone-acetaminophen (NORCO) 10-325 MG tablet Take 1 tablet by mouth 3 (three) times daily as needed for severe pain. 90 tablet 0   HYDROcodone-acetaminophen (NORCO) 10-325 MG tablet Take 1 tablet by mouth 3 times daily as needed for severe pain. 90 tablet 0   HYDROcodone-acetaminophen (NORCO) 10-325 MG  tablet Take 1 tablet by mouth 3 times daily as needed. 90 tablet 0   No facility-administered medications prior to visit.    ROS: Review of Systems  Constitutional:  Negative for activity change, appetite change, chills, fatigue and unexpected weight change.  HENT:  Negative for congestion, mouth sores and sinus pressure.   Eyes:  Negative for visual disturbance.  Respiratory:  Negative for cough and chest tightness.   Gastrointestinal:  Positive for abdominal pain. Negative for nausea.  Genitourinary:  Negative for difficulty urinating, frequency and vaginal pain.  Musculoskeletal:  Positive for back pain. Negative for gait problem.  Skin:  Negative for pallor and rash.  Neurological:  Negative for dizziness, tremors, weakness, numbness and headaches.  Psychiatric/Behavioral:  Negative for confusion and sleep disturbance.     Objective:  BP 122/82 (BP Location: Left Arm, Patient Position: Sitting, Cuff Size: Normal)   Pulse (!) 113   Temp 98 F (36.7 C) (Oral)   Ht 5\' 2"  (1.575 m)   Wt 143 lb (64.9 kg)   SpO2 97%   BMI 26.16 kg/m   BP Readings from Last 3 Encounters:  04/02/23 122/82  12/03/22 110/70  09/26/22 128/76    Wt Readings from Last 3  Encounters:  04/02/23 143 lb (64.9 kg)  12/03/22 141 lb (64 kg)  09/26/22 148 lb 8 oz (67.4 kg)    Physical Exam Constitutional:      General: She is not in acute distress.    Appearance: She is well-developed. She is obese.  HENT:     Head: Normocephalic.     Right Ear: External ear normal.     Left Ear: External ear normal.     Nose: Nose normal.  Eyes:     General:        Right eye: No discharge.        Left eye: No discharge.     Conjunctiva/sclera: Conjunctivae normal.     Pupils: Pupils are equal, round, and reactive to light.  Neck:     Thyroid: No thyromegaly.     Vascular: No JVD.     Trachea: No tracheal deviation.  Cardiovascular:     Rate and Rhythm: Normal rate and regular rhythm.     Heart sounds:  Murmur heard.  Pulmonary:     Effort: No respiratory distress.     Breath sounds: No stridor. No wheezing.  Abdominal:     General: Bowel sounds are normal. There is no distension.     Palpations: Abdomen is soft. There is no mass.     Tenderness: There is no abdominal tenderness. There is no guarding or rebound.  Musculoskeletal:        General: Tenderness present.     Cervical back: Normal range of motion and neck supple. No rigidity.  Lymphadenopathy:     Cervical: No cervical adenopathy.  Skin:    Findings: No erythema or rash.  Neurological:     Mental Status: She is oriented to person, place, and time.     Cranial Nerves: No cranial nerve deficit.     Motor: No abnormal muscle tone.     Coordination: Coordination normal.     Deep Tendon Reflexes: Reflexes normal.  Psychiatric:        Behavior: Behavior normal.        Thought Content: Thought content normal.        Judgment: Judgment normal.   LS spine w/pain  Lab Results  Component Value Date   WBC 7.8 01/03/2023   HGB 13.8 01/03/2023   HCT 41.5 01/03/2023   PLT 109 (L) 01/03/2023   GLUCOSE 105 (H) 01/03/2023   CHOL 221 (H) 08/28/2022   TRIG 210.0 (H) 08/28/2022   HDL 31.00 (L) 08/28/2022   LDLDIRECT 169.0 08/28/2022   LDLCALC 121 (H) 08/30/2014   ALT 9 01/03/2023   AST 10 (L) 01/03/2023   NA 137 01/03/2023   K 3.9 01/03/2023   CL 103 01/03/2023   CREATININE 1.51 (H) 01/03/2023   BUN 22 (H) 01/03/2023   CO2 23 01/03/2023   TSH 1.469 01/03/2023   INR 1.1 12/05/2020   HGBA1C 4.9 10/11/2021    No results found.  Assessment & Plan:   Problem List Items Addressed This Visit     B12 deficiency - Primary   On B12      LOW BACK PAIN, CHRONIC   Chronic and severe Norco tid prn.  On disabiity  Norco prn. Potential benefits of a long term opioids use as well as potential risks (i.e. addiction risk, apnea etc) and complications (i.e. Somnolence, constipation and others) were explained to the patient and  were aknowledged.      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    Dispense:  90 tablet    Refill:  0    Please fill on or after 04/02/23   HYDROcodone-acetaminophen (NORCO) 10-325 MG tablet    Sig: Take 1 tablet by mouth 3 times daily as needed for severe pain.    Dispense:  90 tablet    Refill:  0    Please fill on or after 05/02/2023   HYDROcodone-acetaminophen (NORCO) 10-325 MG tablet    Sig: Take 1 tablet by mouth 3 times daily as needed.    Dispense:  90 tablet    Refill:  0    Please fill on or after 06/01/23   ciprofloxacin (CIPRO) 500 MG tablet    Sig: Take 1 tablet (500 mg total) by mouth 2 (two) times daily.    Dispense:  20 tablet    Refill:  0   ketorolac (TORADOL) 10 MG tablet    Sig: Take 1 tablet (10 mg total) by mouth every 6 (six) hours as needed for severe pain (pain score 7-10).    Dispense:  20 tablet    Refill:  0      Follow-up: No follow-ups on file.  Sonda Primes, MD

## 2023-04-02 NOTE — Assessment & Plan Note (Signed)
On B12

## 2023-04-02 NOTE — Assessment & Plan Note (Addendum)
Stable off Rx.

## 2023-04-04 ENCOUNTER — Encounter: Payer: Self-pay | Admitting: Internal Medicine

## 2023-04-10 ENCOUNTER — Ambulatory Visit: Payer: 59 | Admitting: Internal Medicine

## 2023-04-11 ENCOUNTER — Other Ambulatory Visit: Payer: Self-pay | Admitting: Adult Health

## 2023-04-11 ENCOUNTER — Other Ambulatory Visit: Payer: Self-pay | Admitting: Internal Medicine

## 2023-04-12 ENCOUNTER — Other Ambulatory Visit: Payer: Self-pay | Admitting: Cardiology

## 2023-04-17 ENCOUNTER — Ambulatory Visit (INDEPENDENT_AMBULATORY_CARE_PROVIDER_SITE_OTHER): Payer: 59 | Admitting: Internal Medicine

## 2023-04-17 ENCOUNTER — Encounter: Payer: Self-pay | Admitting: Internal Medicine

## 2023-04-17 VITALS — BP 122/72 | HR 93 | Temp 97.8°F | Ht 62.0 in | Wt 145.6 lb

## 2023-04-17 DIAGNOSIS — K219 Gastro-esophageal reflux disease without esophagitis: Secondary | ICD-10-CM

## 2023-04-17 DIAGNOSIS — K50013 Crohn's disease of small intestine with fistula: Secondary | ICD-10-CM

## 2023-04-17 DIAGNOSIS — K5080 Crohn's disease of both small and large intestine without complications: Secondary | ICD-10-CM

## 2023-04-17 DIAGNOSIS — L02412 Cutaneous abscess of left axilla: Secondary | ICD-10-CM | POA: Diagnosis not present

## 2023-04-17 MED ORDER — PANTOPRAZOLE SODIUM 40 MG PO TBEC: 40.0000 mg | DELAYED_RELEASE_TABLET | Freq: Every day | ORAL | 3 refills | Status: AC

## 2023-04-17 NOTE — Patient Instructions (Signed)
 Will continue to monitor your Crohn's for now.  Continue on pantoprazole for your reflux. I have sent in a year of refills today.    Follow-up in 6 months or sooner if needed.   It is always a pleasure seeing you.   Dr. Marletta Lor

## 2023-04-17 NOTE — Progress Notes (Signed)
 Referring Provider: Tresa Garter, MD Primary Care Physician:  Tresa Garter, MD Primary GI:  Dr. Marletta Lor  Chief Complaint  Patient presents with   Follow-up    Patient here today for a follow up on Crohn's. Patient denies any current gi issues. Patient says she is not on any current medication to treat her crohn's.    HPI:   [name] [name] is a 53 y.o. female who presents to the clinic today for follow up visit. 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  She established care with [place] in January 2017.  She has previously failed Imuran, Humira, Asacol.  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  She was reportedly in a research trial at [facility] for stem cell research at one point.     According to gynecology note dated 05/09/2018 noted a draining rectovaginal fistula with left buttock fluid collection.  Cultures were sent and found to be staph aureus.  Thompson Grayer was held during this time which she has been maintained on since 2017.  CT pelvis in June showed evidence of rectovaginal fistula unchanged from 2018.  Patient was continued on Entyvio with CT in December 10, 2018 which showed a nodular density posterior to the rectum likely representing scarring related to prior inflammatory changes collection.  No drainable fluid identified   Patient was hospitalized in October 2020 with sepsis due to E. coli bacteremia related to UTI as well as multifocal pneumonia and aspiration.  EGD and colonoscopy on 10/15/2019.  Her colon looked remarkably healthy with negative biopsies for any inflammation throughout her entire colon.  Her terminal ileum also looked healthy with negative biopsies as well.  She did have one small tubular adenoma removed. Her EGD showed a Schatzki's ring in her distal esophagus which was dilated with an 18 mm balloon.  She  also had gastritis.  Upper endoscopy biopsies were relatively unremarkable besides chronic gastritis.  March 2023, patient stopped her Entyvio on her own.  She states that she wanted to trial off any medications for Crohn's.  CT abdomen pelvis 07/03/2021 showed no active Crohn's disease.  No evidence of rectovaginal fistula.  Today, Notes normal bowel movements.  No melena or hematochezia.  No abdominal pain.  No mucus in her stools.  Overall doing well.   Procedures: EGD and colonoscopy on 10/15/2019.  Her colon looked remarkably healthy with negative biopsies for any inflammation throughout her entire colon.  Her terminal ileum also looked healthy with negative biopsies as well.  She did have one small tubular adenoma removed. Her EGD showed a Schatzki's ring in her distal esophagus which was dilated with an 18 mm balloon.  She also had gastritis.  Upper endoscopy biopsies were relatively unremarkable besides chronic gastritis.   EGD and colonoscopy in February 2017 showed inflammation and ulceration in the cecum involving the IC valve which was unable to be intubated with colonoscope.  Also noted severe proctitis as well.  EGD showed erosive gastritis duodenitis.  Biopsies from cecal right colon with chronic active colitis and chronic inactive colitis in the rectum.  Negative for dysplasia.  Past Medical History:  Diagnosis Date   Anxiety    Arthritis    Dr. Renae Fickle   Bartholin cyst 2008   vag.   BIPOLAR AFFECTIVE DISORDER 04/14/2007   Crohn's ON ENTYVIO SINCE FEB 2017 2000   COMPLICATED BY RECTOVAGINAL FISTULA.  SJS WITH REMICADE. FAILED HUMIRA.   Depression    Elevated glucose 2010   GERD (gastroesophageal reflux disease)    Hypertension    Kidney stone    LBP (low back pain)    Dr. Regino Schultze   Perianal abscess 2009   Vaginal Pap smear, abnormal    Vitamin B12 deficiency     Past Surgical History:  Procedure Laterality Date   BALLOON DILATION N/A 10/15/2019   Procedure: BALLOON  DILATION;  Surgeon: Lanelle Bal, DO;  [facility];  Service: Endoscopy;  Laterality: N/A;   BIOPSY  10/15/2019   Procedure: BIOPSY;  Surgeon: Lanelle Bal, DO;  [facility]: [facility];  Service: Endoscopy;;   COLONOSCOPY  09/2008   Dr. Alycia Rossetti: ulcers, edema, possible fistula openings all seen in the distal 3 cm of anus/anal canal. 1cm pseudopolyp. rest of colon and TI normal. rectal biopsy with mild chronic active colitis. ileum bx normal.   COLONOSCOPY WITH PROPOFOL N/A 03/29/2015   SLF: Severe proctocolitis limitied to cecum and rectum. Limited exam of the colon mucosa and anal canal.    COLONOSCOPY WITH PROPOFOL N/A 10/15/2019   Procedure: COLONOSCOPY WITH PROPOFOL;  Surgeon: Lanelle Bal, DO;  Location: [facility];  Service: Endoscopy;  Laterality: N/A;  10:45am   ELBOW SURGERY     left   ESOPHAGOGASTRODUODENOSCOPY (EGD) WITH PROPOFOL N/A 03/29/2015   SLF: 1. Erosive gastritis duodentitis.    ESOPHAGOGASTRODUODENOSCOPY (EGD) WITH PROPOFOL N/A 10/15/2019   Procedure: ESOPHAGOGASTRODUODENOSCOPY (EGD) WITH PROPOFOL;  Surgeon: Lanelle Bal, DO;  [facility]: [facility];  Service: Endoscopy;  Laterality: N/A;   RECTOVAGINAL FISTULA CLOSURE     did not help    Current Outpatient Medications  Medication Sig Dispense Refill   ALPRAZolam (XANAX) 1 MG tablet Take 1 tablet (1 mg total) by mouth 2 (two) times daily. 30 tablet 2   benazepril (LOTENSIN) 20 MG tablet TAKE 1 TABLET BY MOUTH EVERY DAY 90 tablet 1   cyanocobalamin (VITAMIN B12) 1000 MCG/ML injection INJECT INTO THE SKIN EVERY 14 DAYS 6 mL 5   diclofenac Sodium (VOLTAREN) 1 % GEL Apply 1 Application topically 4 (four) times daily. 100 g 3   HYDROcodone-acetaminophen (NORCO) 10-325 MG tablet Take 1 tablet by mouth 3 (three) times daily as needed for severe pain. 90 tablet 0   HYDROcodone-acetaminophen (NORCO) 10-325 MG tablet Take 1 tablet by mouth 3 times daily as needed for severe pain. 90 tablet 0    HYDROcodone-acetaminophen (NORCO) 10-325 MG tablet Take 1 tablet by mouth 3 times daily as needed. 90 tablet 0   ketorolac (TORADOL) 10 MG tablet Take 1 tablet (10 mg total) by mouth every 6 (six) hours as needed for severe pain (pain score 7-10). 20 tablet 0   norethindrone (AYGESTIN) 5 MG tablet TAKE 1 TABLET (5 MG TOTAL) BY MOUTH DAILY. 90 tablet 2   pantoprazole (PROTONIX) 40 MG tablet TAKE 1 TABLET BY MOUTH EVERY DAY IN THE MORNING 90 tablet 1   potassium chloride (KLOR-CON) 10 MEQ tablet TAKE 1 TABLET BY MOUTH EVERY DAY 90 tablet 3   sertraline (ZOLOFT) 100 MG tablet Take 200 mg by mouth daily.     SYRINGE-NEEDLE, DISP, 3 ML (B-D SYRINGE/NEEDLE 3CC/25GX5/8) 25G X 5/8" 3 ML MISC Use to administer B12 injection every 14 days 24 each 0   VITAMIN D PO Take by mouth. 5,000 units daily.     No current facility-administered medications for this visit.  Allergies as of 04/17/2023 - Review Complete 04/17/2023  Allergen Reaction Noted   Cephalexin Hives and Itching    Clindamycin Diarrhea 03/03/2023   Codeine Nausea Only    Guaifenesin     Imitrex [sumatriptan]  10/17/2011   Lopressor [metoprolol tartrate] Nausea Only and Other (See Comments) 06/09/2020   Morphine     Remicade [infliximab]  05/26/2015   Sulfonamide derivatives     Ultram [tramadol hcl]  05/26/2015    Family History  Problem Relation Age of Onset   Breast cancer Maternal Grandmother    Early death Maternal Grandmother 8   Cancer Maternal Grandmother        breast   Cancer Maternal Grandfather        colon   Heart attack Father    Heart disease Father    Diabetes Mother    Heart disease Mother    Heart attack Mother    Ovarian cancer Maternal Aunt    Cancer Maternal Aunt        ovarian and cervical    Social History   Socioeconomic History   Marital status: Married    Spouse name: Not on file   Number of children: Not on file   Years of education: Not on file   Highest education level: Associate  degree: occupational, Scientist, product/process development, or vocational program  Occupational History   Not on file  Tobacco Use   Smoking status: Every Day    Current packs/day: 0.00    Average packs/day: 0.5 packs/day for 25.0 years (12.5 ttl pk-yrs)    Types: Cigarettes    Start date: 12/03/1994    Last attempt to quit: 12/21/2018    Years since quitting: 4.3   Smokeless tobacco: Never   Tobacco comments:    Started back   Vaping Use   Vaping status: Never Used  Substance and Sexual Activity   Alcohol use: No   Drug use: No   Sexual activity: Not Currently    Partners: Male    Birth control/protection: Pill  Other Topics Concern   Not on file  Social History Narrative   Regular Exercise-no   Social Drivers of Health   Financial Resource Strain: Low Risk  (04/01/2023)   Overall Financial Resource Strain (CARDIA)    Difficulty of Paying Living Expenses: Not very hard  Food Insecurity: No Food Insecurity (04/01/2023)   Hunger Vital Sign    Worried About Running Out of Food in the Last Year: Never true    Ran Out of Food in the Last Year: Never true  Transportation Needs: No Transportation Needs (04/01/2023)   PRAPARE - Administrator, Civil Service (Medical): No    Lack of Transportation (Non-Medical): No  Physical Activity: Insufficiently Active (04/01/2023)   Exercise Vital Sign    Days of Exercise per Week: 2 days    Minutes of Exercise per Session: 60 min  Stress: No Stress Concern Present (04/01/2023)   [employer] of Occupational Health - Occupational Stress Questionnaire    Feeling of Stress : Not at all  Social Connections: Moderately Integrated (04/01/2023)   Social Connection and Isolation Panel [NHANES]    Frequency of Communication with Friends and Family: Three times a week    Frequency of Social Gatherings with Friends and Family: Twice a week    Attends Religious Services: More than 4 times per year    Active Member of [employer] or Organizations: Yes    Attends Occupational hygienist Meetings: More than  4 times per year    Marital Status: Separated    Subjective: Review of Systems  Constitutional:  Negative for chills and fever.  HENT:  Negative for congestion and hearing loss.   Eyes:  Negative for blurred vision and double vision.  Respiratory:  Negative for cough and shortness of breath.   Cardiovascular:  Negative for chest pain and palpitations.  Gastrointestinal:  Negative for abdominal pain, blood in stool, constipation, diarrhea, heartburn, melena and vomiting.  Genitourinary:  Negative for dysuria and urgency.  Musculoskeletal:  Negative for joint pain and myalgias.  Skin:  Negative for itching and rash.  Neurological:  Negative for dizziness and headaches.  Psychiatric/Behavioral:  Negative for depression. The patient is not nervous/anxious.      Objective: BP 122/72 (BP Location: Left Arm, Patient Position: Sitting, Cuff Size: Normal)   Pulse 93   Temp 97.8 F (36.6 C) (Temporal)   Ht 5\' 2"  (1.575 m)   Wt 145 lb 9.6 oz (66 kg)   BMI 26.63 kg/m  Physical Exam Constitutional:      Appearance: Normal appearance.  HENT:     Head: Normocephalic and atraumatic.  Eyes:     Extraocular Movements: Extraocular movements intact.     Conjunctiva/sclera: Conjunctivae normal.  Cardiovascular:     Rate and Rhythm: Normal rate and regular rhythm.     Heart sounds: Murmur heard.  Pulmonary:     Effort: Pulmonary effort is normal.     Breath sounds: Normal breath sounds.  Abdominal:     General: Bowel sounds are normal.     Palpations: Abdomen is soft.  Musculoskeletal:        General: No swelling. Normal range of motion.     Cervical back: Normal range of motion and neck supple.  Skin:    General: Skin is warm and dry.     Coloration: Skin is not jaundiced.     Comments: Left axilla: Small induration, tender to palpation, no significant erythema, no drainage.  Skin feels warm.  Neurological:     General: No focal deficit present.      Mental Status: She is alert and oriented to person, place, and time.  Psychiatric:        Mood and Affect: Mood normal.        Behavior: Behavior normal.      Assessment: *Ileocolonic Crohn's disease-chronic, in remission *Chronic reflux-well controlled on daily PPI therapy *Cellulitis/abscess of the left axilla   Plan:  In regards to patient's ileocolonic Crohn's, patient appears to be in both clinical and endoscopic remission at this time.   Patient took it upon herself to stop her [facility] March 2023 as she wanted to trial off any medication for Crohn's.  I discussed that by doing so we run the risk of flaring up her Crohn's disease and she understands.  I recommended that she go back on her biologic but she states she would rather not at this time.  Patient told to call office if she has any signs of Crohn's flare including diarrhea, abdominal pain, rectal bleeding.    Patient has received flu vaccine and COVID booster.  Recommend she receive pneumonia vaccine, she will discuss further with her PCP.  DEXA scan up-to-date.    Colonoscopy recall in 2-3 years pending clinical course.    Continue on PPI for chronic reflux.   Follow-up with me in 6 months or sooner if needed.  04/17/2023 8:39 AM   Disclaimer: This note was dictated with  voice recognition software. Similar sounding words can inadvertently be transcribed and may not be corrected upon review.

## 2023-04-19 ENCOUNTER — Other Ambulatory Visit: Payer: Self-pay | Admitting: Internal Medicine

## 2023-06-02 ENCOUNTER — Other Ambulatory Visit: Payer: Self-pay | Admitting: Internal Medicine

## 2023-06-04 ENCOUNTER — Encounter: Payer: Self-pay | Admitting: *Deleted

## 2023-06-06 ENCOUNTER — Ambulatory Visit: Payer: 59 | Admitting: Cardiology

## 2023-06-17 ENCOUNTER — Other Ambulatory Visit: Payer: Self-pay

## 2023-06-17 ENCOUNTER — Inpatient Hospital Stay: Payer: 59 | Attending: Hematology

## 2023-06-17 DIAGNOSIS — D696 Thrombocytopenia, unspecified: Secondary | ICD-10-CM | POA: Diagnosis present

## 2023-06-17 LAB — CBC WITH DIFFERENTIAL/PLATELET
Abs Immature Granulocytes: 0.02 10*3/uL (ref 0.00–0.07)
Basophils Absolute: 0 10*3/uL (ref 0.0–0.1)
Basophils Relative: 0 %
Eosinophils Absolute: 0.1 10*3/uL (ref 0.0–0.5)
Eosinophils Relative: 1 %
HCT: 39.3 % (ref 36.0–46.0)
Hemoglobin: 13.2 g/dL (ref 12.0–15.0)
Immature Granulocytes: 0 %
Lymphocytes Relative: 21 %
Lymphs Abs: 1.5 10*3/uL (ref 0.7–4.0)
MCH: 29.9 pg (ref 26.0–34.0)
MCHC: 33.6 g/dL (ref 30.0–36.0)
MCV: 89.1 fL (ref 80.0–100.0)
Monocytes Absolute: 0.3 10*3/uL (ref 0.1–1.0)
Monocytes Relative: 5 %
Neutro Abs: 5.4 10*3/uL (ref 1.7–7.7)
Neutrophils Relative %: 73 %
Platelets: 126 10*3/uL — ABNORMAL LOW (ref 150–400)
RBC: 4.41 MIL/uL (ref 3.87–5.11)
RDW: 13.6 % (ref 11.5–15.5)
WBC: 7.3 10*3/uL (ref 4.0–10.5)
nRBC: 0 % (ref 0.0–0.2)

## 2023-06-17 LAB — LACTATE DEHYDROGENASE: LDH: 106 U/L (ref 98–192)

## 2023-06-20 HISTORY — PX: OTHER SURGICAL HISTORY: SHX169

## 2023-06-20 NOTE — Progress Notes (Unsigned)
 [facility] [address], [place] [address]   CLINIC:  Medical Oncology/Hematology  PCP:  Genia Kettering, MD [address] [place] [address] [phone]   REASON FOR VISIT:  Follow-up for thrombocytopenia  PRIOR THERAPY: None  CURRENT THERAPY: Surveillance  INTERVAL HISTORY:   [name] [name] 53 y.o. female returns for routine follow-up of thrombocytopenia.  She was last seen by Sheril Dines PA-C on 06/18/2022.  At today's visit, she reports feeling fairly well.***  No recent hospitalizations, surgeries, or changes in baseline health status.   ***She denies any abnormal bleeding.  ***She admits to easy bruising, denies petechial rash.   ***She denies any new masses or lymphadenopathy.  *** She reports cold, drenching night sweats once or twice a month for the past 2 years.  She wonders if this is related to her norethindrone  hormone therapy.   ***She denies any fevers, chills, unintentional weight loss.   ***Denies symptomatic splenomegaly such as early satiety, nausea, LUQ abdominal pain.  She has 75***% energy and 100***% appetite. She endorses that she is maintaining a stable weight.  ASSESSMENT & PLAN:  1.  Thrombocytopenia - Previous workup including hepatitis serology, ANA, rheumatoid factor, SPEP were negative.  Nutritional deficiency workup was also negative. - Etiology thought to be secondary to splenomegaly.  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  Crohn's disease has been stable off of Entyvio . - Most recent labs (06/17/2023) with platelets 126, stable within usual baseline.  LDH normal. -Reports cold, drenching night sweats once or twice a month.***  Denies any other constitutional symptoms or symptomatic splenomegaly.*** - PLAN: Continue annual monitoring with labs and follow-up  visit.  2.  Splenomegaly - Patient reports persistent splenomegaly since she was diagnosed with mononucleosis in her mid 7s.  Reports that she was first told she had an enlarged spleen about 30 years ago. - On physical exam, spleen is palpable about 2 to 3 fingerbreadths below the left costal margin. *** - Splenomegaly measuring 17.7 cm on ultrasound from 12/20/2020 - CT abdomen/pelvis (07/03/2021) shows splenomegaly measuring 17.3 x 15.3 x 7.2 cm.  No focal liver abnormality is seen. - Splenomegaly may also be related to her underlying Crohn's disease.  3.  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  PLAN SUMMARY:*** >> Labs in 1 year = CBC/D, LDH >> OFFICE visit in 1 year (after labs)      REVIEW OF SYSTEMS: ***  Review of Systems  Constitutional:  Positive for fatigue (mild, at baseline). Negative for appetite change, chills, diaphoresis, fever and unexpected weight change.  HENT:   Negative for lump/mass and nosebleeds.   Eyes:  Negative for eye problems.  Respiratory:  Negative for cough, hemoptysis and shortness of breath.   Cardiovascular:  Negative for chest pain, leg swelling and palpitations.  Gastrointestinal:  Negative for abdominal pain, blood in stool, constipation, diarrhea, nausea and vomiting.  Genitourinary:  Negative for hematuria.   Skin: Negative.   Neurological:  Negative for dizziness, headaches and light-headedness.  Hematological:  Does not bruise/bleed easily.     PHYSICAL EXAM:***  ECOG PERFORMANCE STATUS: 0 - Asymptomatic  There were no vitals filed for this visit. There were  no vitals filed for this visit. Physical Exam Constitutional:      Appearance: Normal appearance. She is obese.  Cardiovascular:     Heart sounds: Murmur heard.  Pulmonary:     Breath sounds: Normal  breath sounds.  Neurological:     General: No focal deficit present.     Mental Status: Mental status is at baseline.  Psychiatric:        Behavior: Behavior normal. Behavior is cooperative.     PAST MEDICAL/SURGICAL HISTORY:  Past Medical History:  Diagnosis Date   Anxiety    Arthritis    Dr. Donavon Fudge   Bartholin cyst 2008   vag.   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   Depression    Elevated glucose 2010   GERD (gastroesophageal reflux disease)    Hypertension    Kidney stone    LBP (low back pain)    Dr. Donna Fus   Perianal abscess 2009   Vaginal Pap smear, abnormal    Vitamin B12 deficiency    Past Surgical History:  Procedure Laterality Date   BALLOON DILATION N/A 10/15/2019   Procedure: BALLOON DILATION;  Surgeon: Vinetta Greening, DO;  [facility];  Service: Endoscopy;  Laterality: N/A;   BIOPSY  10/15/2019   Procedure: BIOPSY;  Surgeon: Vinetta Greening, DO;  [facility]: [facility];  Service: Endoscopy;;   COLONOSCOPY  09/2008   Dr. Regena Cap: ulcers, edema, possible fistula openings all seen in the distal 3 cm of anus/anal canal. 1cm pseudopolyp. rest of colon and TI normal. rectal biopsy with mild chronic active colitis. ileum bx normal.   COLONOSCOPY WITH PROPOFOL  N/A 03/29/2015   SLF: Severe proctocolitis limitied to cecum and rectum. Limited exam of the colon mucosa and anal canal.    COLONOSCOPY WITH PROPOFOL  N/A 10/15/2019   Procedure: COLONOSCOPY WITH PROPOFOL ;  Surgeon: Vinetta Greening, DO;  Location: [facility];  Service: Endoscopy;  Laterality: N/A;  10:45am   ELBOW SURGERY     left   ESOPHAGOGASTRODUODENOSCOPY (EGD) WITH PROPOFOL  N/A 03/29/2015   SLF: 1. Erosive gastritis duodentitis.    ESOPHAGOGASTRODUODENOSCOPY (EGD) WITH PROPOFOL  N/A 10/15/2019   Procedure: ESOPHAGOGASTRODUODENOSCOPY (EGD) WITH PROPOFOL ;  Surgeon: Vinetta Greening,  DO;  Location: [facility];  Service: Endoscopy;  Laterality: N/A;   RECTOVAGINAL FISTULA CLOSURE     did not help    SOCIAL HISTORY:  Social History   Socioeconomic History   Marital status: Married    Spouse name: Not on file   Number of children: Not on file   Years of education: Not on file   Highest education level: Associate degree: occupational, Scientist, product/process development, or vocational program  Occupational History   Not on file  Tobacco Use   Smoking status: Every Day    Current packs/day: 0.00    Average packs/day: 0.5 packs/day for 25.0 years (12.5 ttl pk-yrs)    Types: Cigarettes    Start date: 12/03/1994    Last attempt to quit: 12/21/2018    Years since quitting: 4.4   Smokeless tobacco: Never   Tobacco comments:    Started back   Vaping Use   Vaping status: Never Used  Substance and Sexual Activity   Alcohol use: No   Drug use: No   Sexual activity: Not Currently    Partners: Male    Birth control/protection: Pill  Other Topics Concern  Not on file  Social History Narrative   Regular Exercise-no   Social Drivers of Health   Financial Resource Strain: Low Risk  (04/01/2023)   Overall Financial Resource Strain (CARDIA)    Difficulty of Paying Living Expenses: Not very hard  Food Insecurity: No Food Insecurity (04/01/2023)   Hunger Vital Sign    Worried About Running Out of Food in the Last Year: Never true    Ran Out of Food in the Last Year: Never true  Transportation Needs: No Transportation Needs (04/01/2023)   PRAPARE - Administrator, Civil Service (Medical): No    Lack of Transportation (Non-Medical): No  Physical Activity: Insufficiently Active (04/01/2023)   Exercise Vital Sign    Days of Exercise per Week: 2 days    Minutes of Exercise per Session: 60 min  Stress: No Stress Concern Present (04/01/2023)   [employer] of Occupational Health - Occupational Stress Questionnaire    Feeling of Stress : Not at all  Social Connections: Moderately  Integrated (04/01/2023)   Social Connection and Isolation Panel [NHANES]    Frequency of Communication with Friends and Family: Three times a week    Frequency of Social Gatherings with Friends and Family: Twice a week    Attends Religious Services: More than 4 times per year    Active Member of [employer] or Organizations: Yes    Attends Engineer, structural: More than 4 times per year    Marital Status: Separated  Intimate Partner Violence: Not At Risk (09/26/2022)   Humiliation, Afraid, Rape, and Kick questionnaire    Fear of Current or Ex-Partner: No    Emotionally Abused: No    Physically Abused: No    Sexually Abused: No    FAMILY HISTORY:  Family History  Problem Relation Age of Onset   Breast cancer Maternal Grandmother    Early death Maternal Grandmother 31   Cancer Maternal Grandmother        breast   Cancer Maternal Grandfather        colon   Heart attack Father    Heart disease Father    Diabetes Mother    Heart disease Mother    Heart attack Mother    Ovarian cancer Maternal Aunt    Cancer Maternal Aunt        ovarian and cervical    CURRENT MEDICATIONS:  Outpatient Encounter Medications as of 06/24/2023  Medication Sig Note   ALPRAZolam  (XANAX ) 1 MG tablet Take 1 tablet (1 mg total) by mouth 2 (two) times daily.    benazepril  (LOTENSIN ) 20 MG tablet TAKE 1 TABLET BY MOUTH EVERY DAY    cyanocobalamin  (VITAMIN B12) 1000 MCG/ML injection INJECT 1ML INTO THE SKIN EVERY 14 DAYS    diclofenac  Sodium (VOLTAREN ) 1 % GEL Apply 1 Application topically 4 (four) times daily. 04/17/2023: As needed.   HYDROcodone -acetaminophen  (NORCO) 10-325 MG tablet Take 1 tablet by mouth 3 (three) times daily as needed for severe pain.    HYDROcodone -acetaminophen  (NORCO) 10-325 MG tablet Take 1 tablet by mouth 3 times daily as needed for severe pain. 04/17/2023: Duplicate, asked not to remove by patient.    HYDROcodone -acetaminophen  (NORCO) 10-325 MG tablet Take 1 tablet by mouth 3 times  daily as needed. 04/17/2023: Duplicate, asked not to remove by patient.    ketorolac  (TORADOL ) 10 MG tablet Take 1 tablet (10 mg total) by mouth every 6 (six) hours as needed for severe pain (pain score 7-10).    norethindrone  (  AYGESTIN ) 5 MG tablet TAKE 1 TABLET (5 MG TOTAL) BY MOUTH DAILY.    pantoprazole  (PROTONIX ) 40 MG tablet Take 1 tablet (40 mg total) by mouth daily. TAKE 1 TABLET BY MOUTH EVERY DAY IN THE MORNING    potassium chloride  (KLOR-CON ) 10 MEQ tablet TAKE 1 TABLET BY MOUTH EVERY DAY    predniSONE  (DELTASONE ) 10 MG tablet TAKE 4 TABLETS BY MOUTH ONCE A DAY FOR3 DAYS, 3 TABS A DAY FOR 4 DAYS, 2 TABS A DAY FOR 4 DAY, THEN 1 TAB A DAY FOR 6 DAYS THEN STOP    sertraline  (ZOLOFT ) 100 MG tablet Take 200 mg by mouth daily.    SYRINGE-NEEDLE, DISP, 3 ML (B-D SYRINGE/NEEDLE 3CC/25GX5/8) 25G X 5/8" 3 ML MISC Use to administer B12 injection every 14 days    VITAMIN D  PO Take by mouth. 5,000 units daily.    No facility-administered encounter medications on file as of 06/24/2023.    ALLERGIES:  Allergies  Allergen Reactions   Cephalexin Hives and Itching   Clindamycin  Diarrhea   Codeine Nausea Only   Guaifenesin    Imitrex  [Sumatriptan ]     2 fingers got very hot   Lopressor  [Metoprolol  Tartrate] Nausea Only and Other (See Comments)    Made her feel jittery, sweaty / reacted opposite on her.    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  CBC    Component Value Date/Time   WBC 7.3 06/17/2023 1105   RBC 4.41 06/17/2023 1105   HGB 13.2 06/17/2023 1105   HGB 12.1 01/27/2019 1002   HCT 39.3 06/17/2023 1105   HCT 36.5 01/27/2019 1002   PLT 126 (L) 06/17/2023 1105   PLT  146 (L) 01/27/2019 1002   MCV 89.1 06/17/2023 1105   MCV 90 01/27/2019 1002   MCH 29.9 06/17/2023 1105   MCHC 33.6 06/17/2023 1105   RDW 13.6 06/17/2023 1105   RDW 13.0 01/27/2019 1002   LYMPHSABS 1.5 06/17/2023 1105   LYMPHSABS 1.5 01/27/2019 1002   MONOABS 0.3 06/17/2023 1105   EOSABS 0.1 06/17/2023 1105   EOSABS 0.1 01/27/2019 1002   BASOSABS 0.0 06/17/2023 1105   BASOSABS 0.0 01/27/2019 1002      Latest Ref Rng & Units 04/02/2023    8:34 AM 01/03/2023   10:42 AM 10/04/2022    3:07 PM  CMP  Glucose 70 - 99 mg/dL 951  884  166   BUN 6 - 23 mg/dL 18  22  17    Creatinine 0.40 - 1.20 mg/dL 0.63  0.16  0.10   Sodium 135 - 145 mEq/L 142  137  137   Potassium 3.5 - 5.1 mEq/L 4.0  3.9  3.5   Chloride 96 - 112 mEq/L 105  103  105   CO2 19 - 32 mEq/L 28  23  20    Calcium 8.4 - 10.5 mg/dL 9.5  8.9  9.7    9.3   Total Protein 6.0 - 8.3 g/dL 7.3  6.4    Total Bilirubin 0.2 - 1.2 mg/dL 0.5  0.7    Alkaline Phos 39 - 117  U/L 64  50    AST 0 - 37 U/L 11  10    ALT 0 - 35 U/L 8  9      DIAGNOSTIC IMAGING:  I have independently reviewed the relevant imaging and discussed with the patient.   WRAP UP:  All questions were answered. The patient knows to call the clinic with any problems, questions or concerns.  Medical decision making: Low***  Time spent on visit: I spent 15 minutes counseling the patient face to face. The total time spent in the appointment was 22 minutes and more than 50% was on counseling.  Sonnie Dusky, PA-C  ***

## 2023-06-24 ENCOUNTER — Inpatient Hospital Stay: Payer: 59 | Attending: Physician Assistant | Admitting: Physician Assistant

## 2023-06-24 ENCOUNTER — Ambulatory Visit: Payer: 59 | Admitting: Physician Assistant

## 2023-06-24 VITALS — BP 128/74 | HR 79 | Temp 98.1°F | Resp 20 | Wt 146.4 lb

## 2023-06-24 DIAGNOSIS — K5090 Crohn's disease, unspecified, without complications: Secondary | ICD-10-CM | POA: Diagnosis not present

## 2023-06-24 DIAGNOSIS — D696 Thrombocytopenia, unspecified: Secondary | ICD-10-CM

## 2023-06-24 DIAGNOSIS — Z79899 Other long term (current) drug therapy: Secondary | ICD-10-CM | POA: Insufficient documentation

## 2023-06-24 DIAGNOSIS — L511 Stevens-Johnson syndrome: Secondary | ICD-10-CM | POA: Insufficient documentation

## 2023-06-24 DIAGNOSIS — R161 Splenomegaly, not elsewhere classified: Secondary | ICD-10-CM | POA: Diagnosis not present

## 2023-07-02 ENCOUNTER — Ambulatory Visit: Payer: 59 | Admitting: Internal Medicine

## 2023-07-06 ENCOUNTER — Encounter: Payer: Self-pay | Admitting: Internal Medicine

## 2023-07-08 ENCOUNTER — Encounter: Payer: Self-pay | Admitting: Internal Medicine

## 2023-07-10 ENCOUNTER — Encounter: Payer: Self-pay | Admitting: Internal Medicine

## 2023-07-10 NOTE — Telephone Encounter (Signed)
 Copied from CRM [id number]. Topic: Clinical - Medication Question >> Jul 10, 2023  3:16 PM Martinique E wrote: Reason for CRM: Patient was calling in regarding her HYDROcodone -acetaminophen  (NORCO) 10-325 MG tablet. Stated she is out of this medication, but will not be able to get it filled until her appointment on May 28th. Patient questioning if this could get filled before that appointment. Callback number [phone].

## 2023-07-11 ENCOUNTER — Other Ambulatory Visit: Payer: Self-pay | Admitting: Internal Medicine

## 2023-07-11 MED ORDER — HYDROCODONE-ACETAMINOPHEN 10-325 MG PO TABS: 1.0000 | ORAL_TABLET | Freq: Three times a day (TID) | ORAL | 0 refills | Status: DC | PRN

## 2023-07-11 NOTE — Telephone Encounter (Signed)
 Copied from CRM [id number]. Topic: Clinical - Medication Question >> Jul 11, 2023  9:29 AM Hassie Lint wrote: Reason for CRM: Patient calling for an update on her request for a refill of HYDROcodone -acetaminophen  (NORCO) 10-325 MG tablet. Advised a message was sent and waiting on the DR, but patient says she has been waiting more than the 3 business days we advise.  Patient can be reached at [phone]

## 2023-07-17 ENCOUNTER — Ambulatory Visit (INDEPENDENT_AMBULATORY_CARE_PROVIDER_SITE_OTHER): Admitting: Internal Medicine

## 2023-07-17 ENCOUNTER — Encounter: Payer: Self-pay | Admitting: Internal Medicine

## 2023-07-17 VITALS — BP 114/70 | HR 93 | Temp 97.9°F | Ht 62.0 in | Wt 146.0 lb

## 2023-07-17 DIAGNOSIS — E538 Deficiency of other specified B group vitamins: Secondary | ICD-10-CM | POA: Diagnosis not present

## 2023-07-17 DIAGNOSIS — G8929 Other chronic pain: Secondary | ICD-10-CM

## 2023-07-17 DIAGNOSIS — F419 Anxiety disorder, unspecified: Secondary | ICD-10-CM

## 2023-07-17 DIAGNOSIS — E559 Vitamin D deficiency, unspecified: Secondary | ICD-10-CM

## 2023-07-17 DIAGNOSIS — M5440 Lumbago with sciatica, unspecified side: Secondary | ICD-10-CM

## 2023-07-17 DIAGNOSIS — F32A Depression, unspecified: Secondary | ICD-10-CM

## 2023-07-17 MED ORDER — ALPRAZOLAM 1 MG PO TABS: 1.0000 mg | ORAL_TABLET | Freq: Two times a day (BID) | ORAL | 2 refills | Status: AC

## 2023-07-17 MED ORDER — HYDROCODONE-ACETAMINOPHEN 10-325 MG PO TABS: 1.0000 | ORAL_TABLET | Freq: Three times a day (TID) | ORAL | 0 refills | Status: DC | PRN

## 2023-07-17 NOTE — Progress Notes (Signed)
 Subjective:  Patient ID: [name] [name], female    DOB: Feb 24, 1970  Age: 53 y.o. MRN: [id number]  CC: Medical Management of Chronic Issues (3 MNTH F/U)   HPI TELISA OHLSEN presents for LBP, anxiety, B12 def C/o stress at home - husband has cancer  Outpatient Medications Prior to Visit  Medication Sig Dispense Refill   cyanocobalamin  (VITAMIN B12) 1000 MCG/ML injection INJECT 1ML INTO THE SKIN EVERY 14 DAYS 6 mL 5   diclofenac  Sodium (VOLTAREN ) 1 % GEL Apply 1 Application topically 4 (four) times daily. 100 g 3   erythromycin  ophthalmic ointment SMARTSIG:0.5 Inch(es) Left Eye Twice Daily     ketorolac  (TORADOL ) 10 MG tablet Take 1 tablet (10 mg total) by mouth every 6 (six) hours as needed for severe pain (pain score 7-10). 20 tablet 0   neomycin-polymyxin b-dexamethasone  (MAXITROL) 3.5-10000-0.1 OINT at bedtime.     norethindrone  (AYGESTIN ) 5 MG tablet TAKE 1 TABLET (5 MG TOTAL) BY MOUTH DAILY. 90 tablet 2   ofloxacin (OCUFLOX) 0.3 % ophthalmic solution INSTILL 1 DROP BY OPHTHALMIC ROUTE 4 TIMES EVERY DAY INTO LEFT EYE FOR 1 WEEK, THEN STOP     pantoprazole  (PROTONIX ) 40 MG tablet Take 1 tablet (40 mg total) by mouth daily. TAKE 1 TABLET BY MOUTH EVERY DAY IN THE MORNING 90 tablet 3   potassium chloride  (KLOR-CON ) 10 MEQ tablet TAKE 1 TABLET BY MOUTH EVERY DAY 90 tablet 3   prednisoLONE acetate (PRED FORTE) 1 % ophthalmic suspension PLEASE SEE ATTACHED FOR DETAILED DIRECTIONS     sertraline  (ZOLOFT ) 100 MG tablet Take 200 mg by mouth daily.     SYRINGE-NEEDLE, DISP, 3 ML (B-D SYRINGE/NEEDLE 3CC/25GX5/8) 25G X 5/8" 3 ML MISC Use to administer B12 injection every 14 days 24 each 0   VITAMIN D  PO Take by mouth. 5,000 units daily.     ALPRAZolam  (XANAX ) 1 MG tablet Take 1 tablet (1 mg total) by mouth 2 (two) times daily. 30 tablet 2   HYDROcodone -acetaminophen  (NORCO) 10-325 MG tablet Take 1 tablet by mouth 3 times daily as needed for severe pain. 90 tablet 0   HYDROcodone -acetaminophen   (NORCO) 10-325 MG tablet Take 1 tablet by mouth 3 times daily as needed. 90 tablet 0   HYDROcodone -acetaminophen  (NORCO) 10-325 MG tablet Take 1 tablet by mouth 3 (three) times daily as needed for severe pain. 90 tablet 0   predniSONE  (DELTASONE ) 10 MG tablet TAKE 4 TABLETS BY MOUTH ONCE A DAY FOR3 DAYS, 3 TABS A DAY FOR 4 DAYS, 2 TABS A DAY FOR 4 DAY, THEN 1 TAB A DAY FOR 6 DAYS THEN STOP 38 tablet 1   benazepril  (LOTENSIN ) 20 MG tablet TAKE 1 TABLET BY MOUTH EVERY DAY (Patient not taking: Reported on 07/17/2023) 90 tablet 1   No facility-administered medications prior to visit.    ROS: Review of Systems  Constitutional:  Negative for activity change, appetite change, chills, fatigue and unexpected weight change.  HENT:  Negative for congestion, mouth sores and sinus pressure.   Eyes:  Positive for visual disturbance.  Respiratory:  Negative for cough and chest tightness.   Gastrointestinal:  Negative for abdominal pain and nausea.  Genitourinary:  Negative for difficulty urinating, frequency and vaginal pain.  Musculoskeletal:  Positive for back pain. Negative for gait problem.  Skin:  Negative for pallor and rash.  Neurological:  Negative for dizziness, tremors, weakness, numbness and headaches.  Psychiatric/Behavioral:  Positive for dysphoric mood. Negative for confusion, sleep disturbance and suicidal ideas.  The patient is nervous/anxious.     Objective:  BP 114/70   Pulse 93   Temp 97.9 F (36.6 C) (Oral)   Ht 5\' 2"  (1.575 m)   Wt 146 lb (66.2 kg)   SpO2 96%   BMI 26.70 kg/m   BP Readings from Last 3 Encounters:  07/17/23 114/70  06/24/23 128/74  04/17/23 122/72    Wt Readings from Last 3 Encounters:  07/17/23 146 lb (66.2 kg)  06/24/23 146 lb 6.2 oz (66.4 kg)  04/17/23 145 lb 9.6 oz (66 kg)    Physical Exam Constitutional:      General: She is not in acute distress.    Appearance: Normal appearance. She is well-developed.  HENT:     Head: Normocephalic.      Right Ear: External ear normal.     Left Ear: External ear normal.     Nose: Nose normal.  Eyes:     General:        Right eye: No discharge.        Left eye: No discharge.     Conjunctiva/sclera: Conjunctivae normal.     Pupils: Pupils are equal, round, and reactive to light.  Neck:     Thyroid : No thyromegaly.     Vascular: No JVD.     Trachea: No tracheal deviation.  Cardiovascular:     Rate and Rhythm: Normal rate and regular rhythm.     Heart sounds: Normal heart sounds.  Pulmonary:     Effort: No respiratory distress.     Breath sounds: No stridor. No wheezing.  Abdominal:     General: Bowel sounds are normal. There is no distension.     Palpations: Abdomen is soft. There is no mass.     Tenderness: There is no abdominal tenderness. There is no guarding or rebound.  Musculoskeletal:        General: Tenderness present.     Cervical back: Normal range of motion and neck supple. No rigidity.  Lymphadenopathy:     Cervical: No cervical adenopathy.  Skin:    Findings: No erythema or rash.  Neurological:     Mental Status: Mental status is at baseline.     Cranial Nerves: No cranial nerve deficit.     Motor: No abnormal muscle tone.     Coordination: Coordination normal.     Deep Tendon Reflexes: Reflexes normal.  Psychiatric:        Behavior: Behavior normal.        Thought Content: Thought content normal.        Judgment: Judgment normal.   LS spine - pain w/ROM Sad affect  Lab Results  Component Value Date   WBC 7.3 06/17/2023   HGB 13.2 06/17/2023   HCT 39.3 06/17/2023   PLT 126 (L) 06/17/2023   GLUCOSE 116 (H) 04/02/2023   CHOL 221 (H) 08/28/2022   TRIG 210.0 (H) 08/28/2022   HDL 31.00 (L) 08/28/2022   LDLDIRECT 169.0 08/28/2022   LDLCALC 121 (H) 08/30/2014   ALT 8 04/02/2023   AST 11 04/02/2023   NA 142 04/02/2023   K 4.0 04/02/2023   CL 105 04/02/2023   CREATININE 1.38 (H) 04/02/2023   BUN 18 04/02/2023   CO2 28 04/02/2023   TSH 1.469 01/03/2023    INR 1.1 12/05/2020   HGBA1C 4.9 10/11/2021    No results found.  Assessment & Plan:   Problem List Items Addressed This Visit     B12 deficiency - Primary  On B12      Anxiety   Worse Husband was diagnosed with renal cell cancer st 4 in 2024      Relevant Medications   ALPRAZolam  (XANAX ) 1 MG tablet   Depression    Husband was diagnosed with renal cell cancer st 4 in 2024 Cont on Zoloft       Relevant Medications   ALPRAZolam  (XANAX ) 1 MG tablet   LOW BACK PAIN, CHRONIC   Chronic and severe Norco tid prn.  On disabiity  Norco prn. Potential benefits of a long term opioids use as well as potential risks (i.e. addiction risk, apnea etc) and complications (i.e. Somnolence, constipation and others) were explained to the patient and were aknowledged.      Relevant Medications   HYDROcodone -acetaminophen  (NORCO) 10-325 MG tablet   HYDROcodone -acetaminophen  (NORCO) 10-325 MG tablet   HYDROcodone -acetaminophen  (NORCO) 10-325 MG tablet   Vitamin D  deficiency   On Vit D         Meds ordered this encounter  Medications   ALPRAZolam  (XANAX ) 1 MG tablet    Sig: Take 1 tablet (1 mg total) by mouth 2 (two) times daily.    Dispense:  30 tablet    Refill:  2   HYDROcodone -acetaminophen  (NORCO) 10-325 MG tablet    Sig: Take 1 tablet by mouth 3 times daily as needed for severe pain.    Dispense:  90 tablet    Refill:  0    Please fill on or after 10/09/2023   HYDROcodone -acetaminophen  (NORCO) 10-325 MG tablet    Sig: Take 1 tablet by mouth 3 times daily as needed.    Dispense:  90 tablet    Refill:  0    Please fill on or after 08/10/23   HYDROcodone -acetaminophen  (NORCO) 10-325 MG tablet    Sig: Take 1 tablet by mouth 3 (three) times daily as needed for severe pain.    Dispense:  90 tablet    Refill:  0    Please fill on or after 09/09/23       Anitra Barn, MD

## 2023-07-17 NOTE — Assessment & Plan Note (Signed)
Worse Husband was diagnosed with renal cell cancer st 4 in 2024

## 2023-07-17 NOTE — Assessment & Plan Note (Signed)
 On B12

## 2023-07-17 NOTE — Assessment & Plan Note (Signed)
  Husband was diagnosed with renal cell cancer st 4 in 2024 Cont on Zoloft

## 2023-07-17 NOTE — Assessment & Plan Note (Signed)
 Chronic and severe Norco tid prn.  On disabiity  Norco prn. Potential benefits of a long term opioids use as well as potential risks (i.e. addiction risk, apnea etc) and complications (i.e. Somnolence, constipation and others) were explained to the patient and were aknowledged.

## 2023-07-17 NOTE — Assessment & Plan Note (Signed)
 On Vit D

## 2023-07-21 IMAGING — CT CT ABD-PELV W/ CM
3 of 7 series · 10 of 46 positions shown, 17 images · IV contrast (Omnipaque or Isovue)
Comparison: 12/10/2018

CLINICAL DATA: Crohn disease, rectovaginal fistula

EXAM:
CT ABDOMEN AND PELVIS WITH CONTRAST
TECHNIQUE: Multidetector CT imaging of the abdomen and pelvis was performed
using the standard protocol following bolus administration of
intravenous contrast.

[Series 2: axial st · axial · 0.69mm/px · z∈[-493,-143]mm · 6 of 99 slices shown, 11 images]
[im 15/99  soft-tissue]
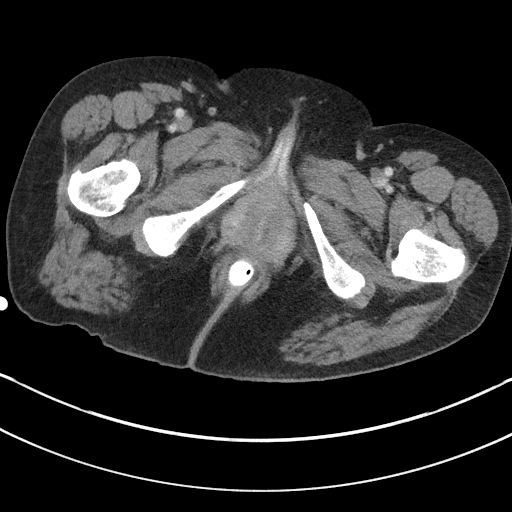
[im 15/99  bone]
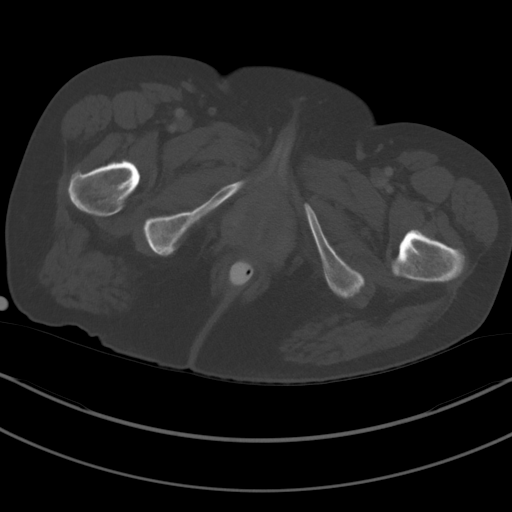
[im 29/99  soft-tissue]
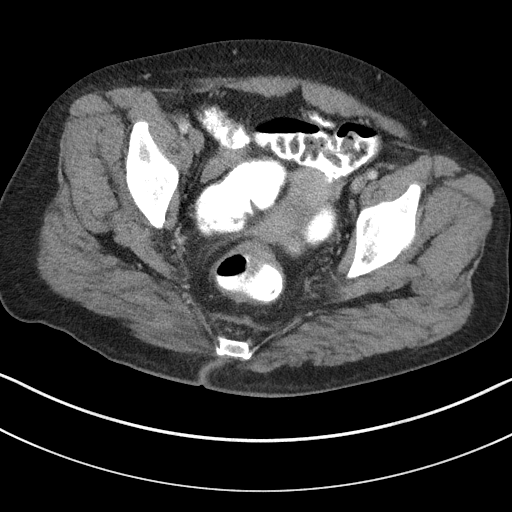
[im 43/99  soft-tissue]
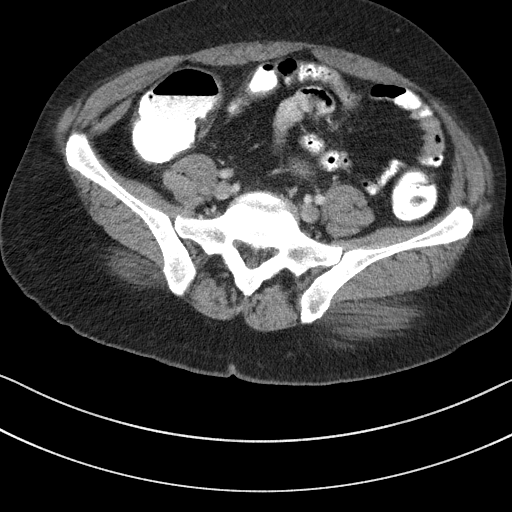
[im 43/99  lung]
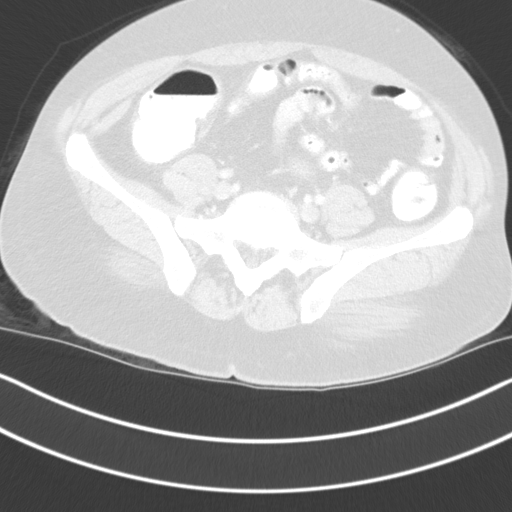
[im 57/99  soft-tissue]
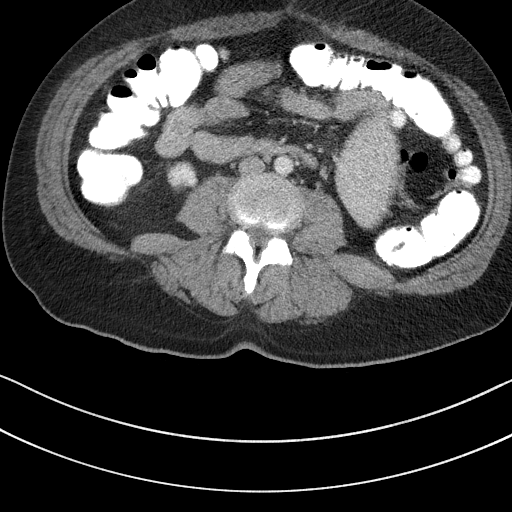
[im 57/99  lung]
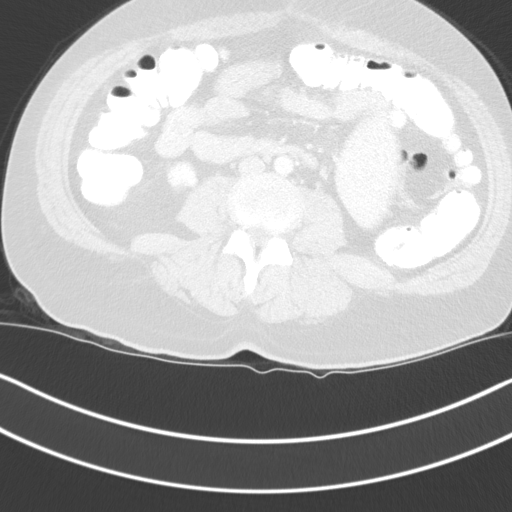
[im 71/99  soft-tissue]
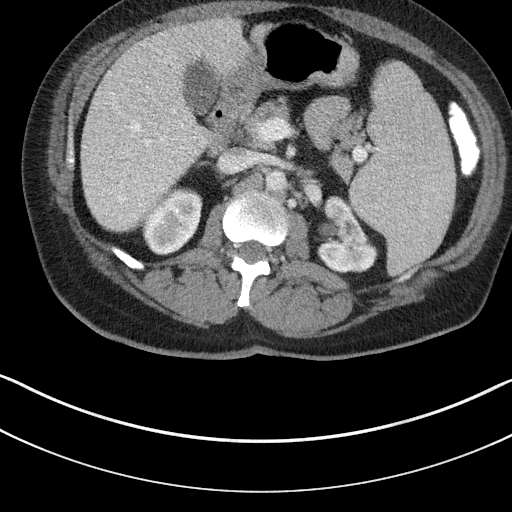
[im 71/99  lung]
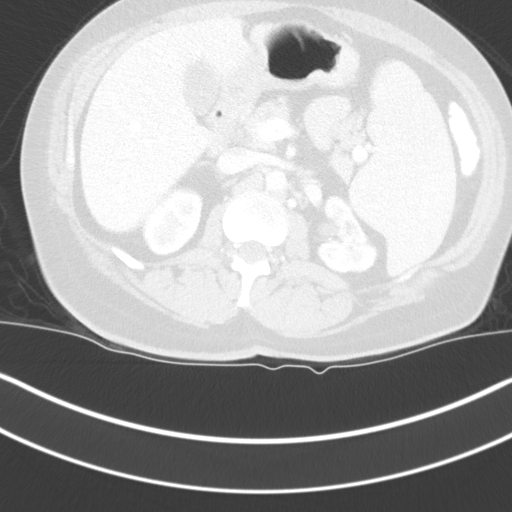
[im 85/99  soft-tissue]
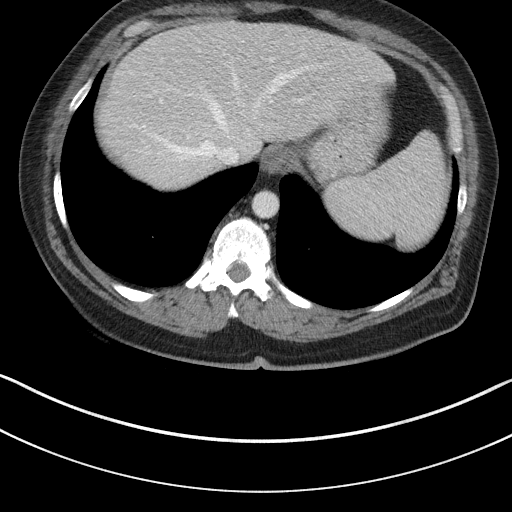
[im 85/99  lung]
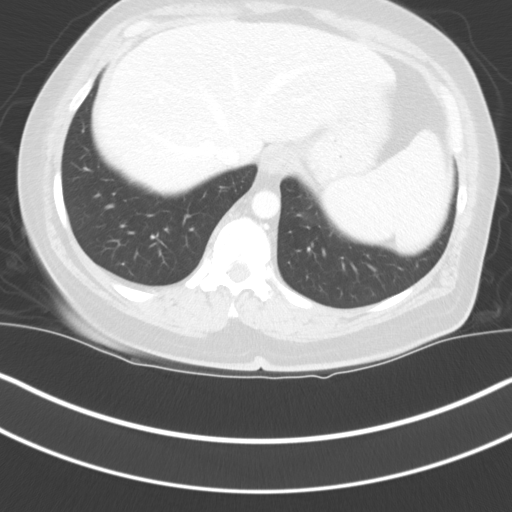

[Series 6: coronal st · coronal · 0.86mm/px · 3 of 117 slices shown, 4 images]
[im 39/117  soft-tissue]
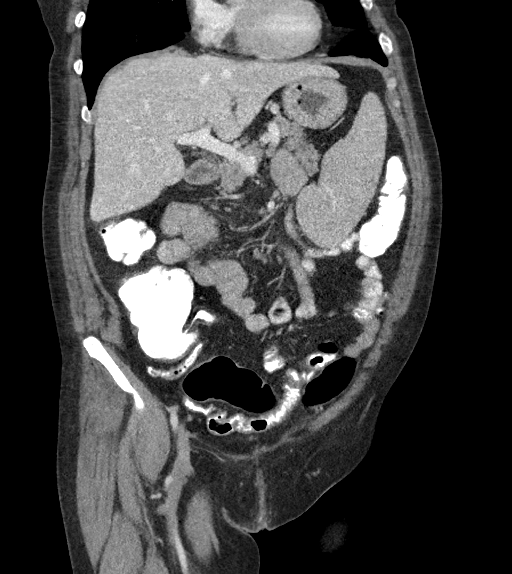
[im 52/117  soft-tissue]
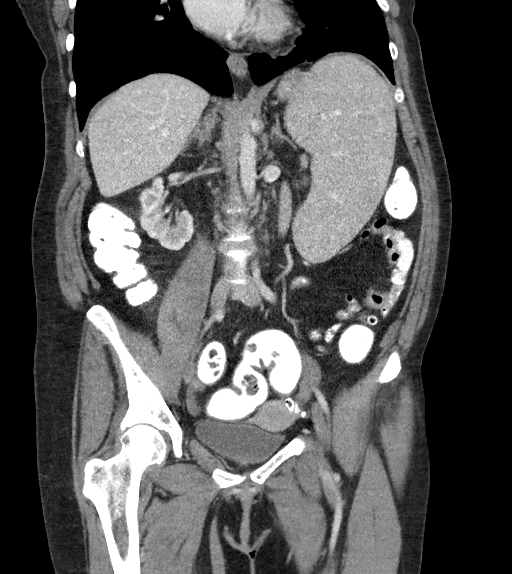
[im 52/117  bone]
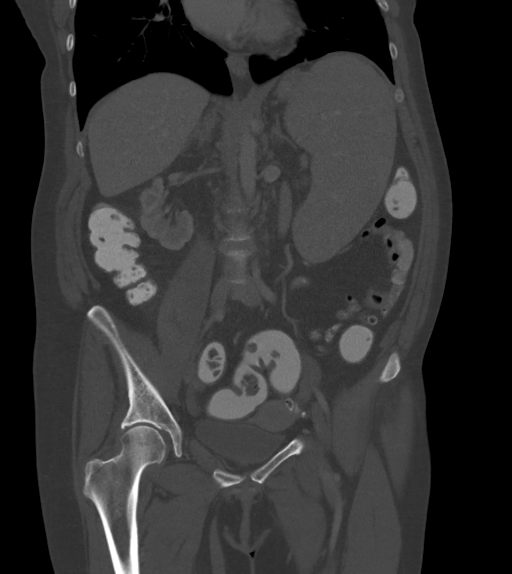
[im 65/117  soft-tissue]
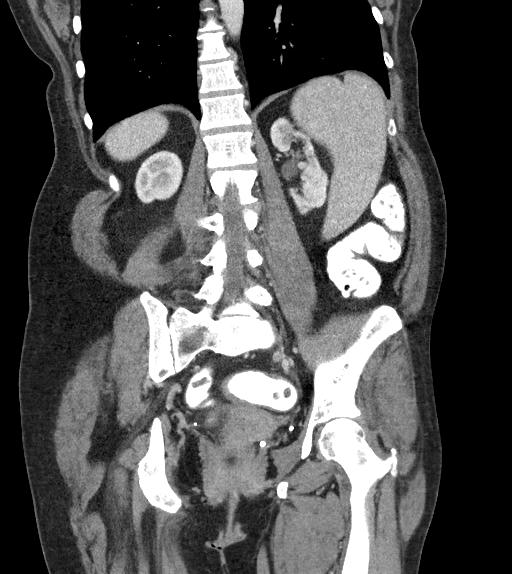

[Series 7: sagittal st · sagittal · 0.70mm/px · 1 of 117 slices shown, 2 images]
[im 39/117  soft-tissue]
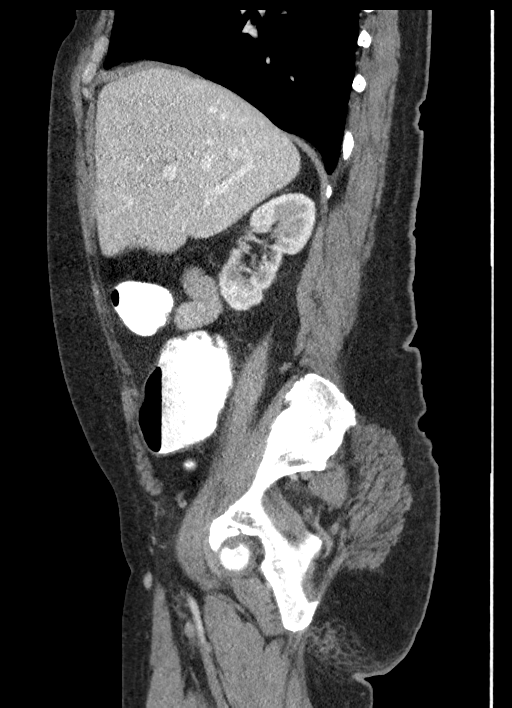
[im 39/117  bone]
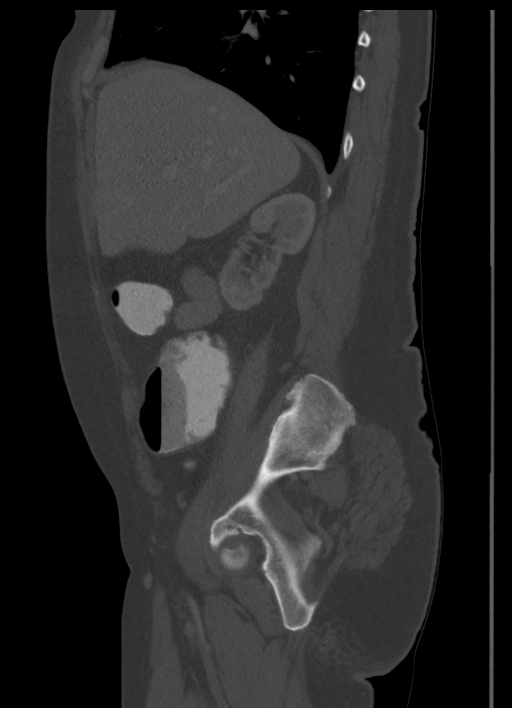

[10 of 46 positions shown; findings below may reference images not displayed]

RADIATION DOSE REDUCTION: This exam was performed according to the
departmental dose-optimization program which includes automated
exposure control, adjustment of the mA and/or kV according to
patient size and/or use of iterative reconstruction technique.

CONTRAST:  80mL OMNIPAQUE IOHEXOL 300 MG/ML  SOLN
FINDINGS: Lower chest: No acute pleural or parenchymal lung disease.

Hepatobiliary: No focal liver abnormality is seen. No gallstones,
gallbladder wall thickening, or biliary dilatation.

Pancreas: Unremarkable. No pancreatic ductal dilatation or
surrounding inflammatory changes.

Spleen: There is marked splenomegaly, measuring 17.3 x 15.3 x
cm, not appreciably changed since prior exam. No focal parenchymal
abnormality.

Adrenals/Urinary Tract: Areas of cortical scarring are seen within
the upper pole left kidney and lower pole right kidney. No other
focal renal abnormalities. No urinary tract calculi or obstruction.
Bladder is minimally distended with no gross abnormalities. The
adrenals are normal.

Stomach/Bowel: No bowel obstruction or ileus. Normal appendix right
lower quadrant.

Rectal contrast was instilled, with reflux into the distal small
bowel. There is scattered diverticulosis of the descending and
sigmoid colon without diverticulitis. No evidence of bowel wall
thickening or inflammatory change. There is no evidence of rectal
contrast extravasation to suggest rectovaginal fistula. Of note, the
rectal tube and balloon could obscure a low rectal fistula.

Vascular/Lymphatic: No significant vascular findings are present. No
enlarged abdominal or pelvic lymph nodes.

Reproductive: Uterus and bilateral adnexa are unremarkable.

Other: No free fluid or free intraperitoneal gas. No abdominal wall
hernia.

Musculoskeletal: No acute or destructive bony lesions. Reconstructed
images demonstrate no additional findings.
IMPRESSION: 1. Stable marked splenomegaly.
2. Distal colonic diverticulosis without diverticulitis.
3. No evidence of inflammatory bowel disease. No evidence of
rectovaginal fistula, though low fistula could be obscured by the
indwelling rectal tube and balloon cuff.
4. Bilateral renal cortical scarring.

## 2023-08-06 ENCOUNTER — Telehealth: Payer: Self-pay

## 2023-08-06 NOTE — Telephone Encounter (Signed)
 Pt was transferred to schedule with Dr Mordechai April he states it ok to use a new patient spot

## 2023-08-06 NOTE — Telephone Encounter (Signed)
 Copied from CRM [id number]. Topic: General - Other >> Aug 06, 2023  7:54 AM Freya Jesus wrote: Reason for CRM: Patient is requesting a call back from Dr. Tami Falcon nurse regarding assistance with daily routine since the passing of her husband. Requesting some form of FMLA for her son to be her caretaker until her next appointment.

## 2023-08-07 ENCOUNTER — Telehealth: Payer: Self-pay | Admitting: Internal Medicine

## 2023-08-07 NOTE — Telephone Encounter (Signed)
 noted

## 2023-08-08 ENCOUNTER — Telehealth: Payer: Self-pay | Admitting: Family Medicine

## 2023-08-08 DIAGNOSIS — R300 Dysuria: Secondary | ICD-10-CM

## 2023-08-08 NOTE — Progress Notes (Signed)
 Based on what you shared with me, I feel your condition warrants further evaluation as soon as possible at an Emergency department.    NOTE: There will be NO CHARGE for this eVisit   If you are having a true medical emergency please call 911.      Emergency Department-[facility]  Get Driving Directions  [phone]  [address]  [place], [place] [address]  Open 24/7/365      [facility] Emergency Department at [facility]  Get Driving Directions  [address]  [place], [place] [address]  Open 24/7/365    Emergency Department- [facility]  Get Driving Directions  [phone]  [address]  [place], [place] [address]  Open 24/7/365      [facility]  Get Driving Directions  [phone]  [address]  [place], [place] [address]  Open 24/7/365    [facility]  Emergency Department- [facility]  Get Driving Directions  [phone]  [address]  [place], [place] [address]  Open 24/7/365    [place]  Emergency Department- [facility]  Get Driving Directions  [address]  [place], [place] [address]  Open 24/7/365    [facility]  Emergency Department- [facility]  Get Driving Directions  [phone]  [address]  [place], [place] [address]  Open 24/7/365

## 2023-08-08 NOTE — Telephone Encounter (Signed)
 I am sorry about [name]'s husband passing [name] [name] FMLA for her son.  I assume you will send us  a form.  We need to know what time off is requested -a single block or intermittent Thank you

## 2023-08-09 NOTE — Telephone Encounter (Signed)
 I was able to speak with the pt and inform her that PCP has agreed to do complete FMLA paperwork for her son to be her caregiver since husbands passing. Pt has stated she will try to have paperwork dropped off to us  by today or tomorrow. She has also been informed of the 7 - 10 business days time frame for paperwork turn around timing of completion as well.

## 2023-08-12 ENCOUNTER — Telehealth: Payer: Self-pay | Admitting: Internal Medicine

## 2023-08-12 NOTE — Telephone Encounter (Signed)
 Patient dropped off document FMLA, to be filled out by provider. Patient requested to send it back via Call Patient to pick up within 7-days. Document is located in providers tray at front office.Please advise at Mobile [phone] (mobile)

## 2023-08-14 ENCOUNTER — Ambulatory Visit (INDEPENDENT_AMBULATORY_CARE_PROVIDER_SITE_OTHER): Admitting: Internal Medicine

## 2023-08-14 ENCOUNTER — Encounter: Payer: Self-pay | Admitting: Internal Medicine

## 2023-08-14 VITALS — BP 120/82 | HR 99 | Temp 97.5°F | Ht 61.0 in | Wt 134.0 lb

## 2023-08-14 DIAGNOSIS — R197 Diarrhea, unspecified: Secondary | ICD-10-CM

## 2023-08-14 DIAGNOSIS — K5080 Crohn's disease of both small and large intestine without complications: Secondary | ICD-10-CM

## 2023-08-14 DIAGNOSIS — K625 Hemorrhage of anus and rectum: Secondary | ICD-10-CM

## 2023-08-14 DIAGNOSIS — K50013 Crohn's disease of small intestine with fistula: Secondary | ICD-10-CM

## 2023-08-14 DIAGNOSIS — K219 Gastro-esophageal reflux disease without esophagitis: Secondary | ICD-10-CM | POA: Diagnosis not present

## 2023-08-14 DIAGNOSIS — L03112 Cellulitis of left axilla: Secondary | ICD-10-CM | POA: Diagnosis not present

## 2023-08-14 NOTE — Progress Notes (Signed)
 Referring Provider: Garald Karlynn GAILS, MD Primary Care Physician:  Garald Karlynn GAILS, MD Primary GI:  Dr. Cindie  Chief Complaint  Patient presents with   Follow-up    Patient here today for a follow up on Crohn's. Patient says she has been having issues with diarrhea,has seen bright red blood in stools abdominal pain. She is not currently taking any medications for her disease.     HPI:   [name] [name] is a 53 y.o. female who presents to the clinic today for follow up visit. 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  She established care with us  in January 2017.  She has previously failed Imuran, Humira, Asacol.  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  She was reportedly in a research trial at [facility] for stem cell research at one point.     According to gynecology note dated 05/09/2018 noted a draining rectovaginal fistula with left buttock fluid collection.  Cultures were sent and found to be staph aureus.  Entyvio  was held during this time which she has been maintained on since 2017.  CT pelvis in June showed evidence of rectovaginal fistula unchanged from 2018.  Patient was continued on Entyvio  with CT in December 10, 2018 which showed a nodular density posterior to the rectum likely representing scarring related to prior inflammatory changes collection.  No drainable fluid identified   Patient was hospitalized in October 2020 with sepsis due to E. coli bacteremia related to UTI as well as multifocal pneumonia and aspiration.  EGD and colonoscopy on 10/15/2019.  Her colon looked remarkably healthy with negative biopsies for any inflammation throughout her entire colon.  Her terminal ileum also looked healthy with negative biopsies as well.  She did have one small tubular adenoma removed. Her EGD showed a Schatzki's ring in her  distal esophagus which was dilated with an 18 mm balloon.  She also had gastritis.  Upper endoscopy biopsies were relatively unremarkable besides chronic gastritis.  March 2023, patient stopped her Entyvio  on her own.  She states that she wanted to trial off any medications for Crohn's.  CT abdomen pelvis 07/03/2021 showed no active Crohn's disease.  No evidence of rectovaginal fistula.  Today, she is complaining that she believes that she is becoming septic.  She recently lost her husband to stage IV renal cancer.  She states she was evaluated at [facility] ER and diagnosed with UTI and completed antibiotics for this.  Reports her dysuria is improved though she feels as though an infection is running through her entire body.  She is concerned that this is the same feeling she had prior to being admitted to the hospital for septic shock and respiratory failure 5 years ago.  From a GI standpoint, does note worsening diarrhea as well as intermittent bleeding.  No abdominal pain.  Did have some mucus a few weeks ago in her stool though this has resolved.   Procedures: EGD and colonoscopy on 10/15/2019.  Her colon looked remarkably healthy with negative biopsies for any inflammation throughout her entire colon.  Her terminal ileum also looked healthy with negative biopsies as well.  She did have one small tubular adenoma removed. Her EGD showed a Schatzki's ring in her distal esophagus which was dilated with an 18 mm balloon.  She also had gastritis.  Upper endoscopy biopsies were relatively unremarkable besides chronic gastritis.  EGD and colonoscopy in February 2017 showed inflammation and ulceration in the cecum involving the IC valve which was unable to be intubated with colonoscope.  Also noted severe proctitis as well.  EGD showed erosive gastritis duodenitis.  Biopsies from cecal right colon with chronic active colitis and chronic inactive colitis in the rectum.  Negative for dysplasia.  Past Medical  History:  Diagnosis Date   Anxiety    Arthritis    Dr. Deward   Bartholin cyst 2008   vag.   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   Depression    Elevated glucose 2010   GERD (gastroesophageal reflux disease)    Hypertension    Kidney stone    LBP (low back pain)    Dr. Cesario   Perianal abscess 2009   Vaginal Pap smear, abnormal    Vitamin B12 deficiency     Past Surgical History:  Procedure Laterality Date   BALLOON DILATION N/A 10/15/2019   Procedure: BALLOON DILATION;  Surgeon: Cindie Carlin POUR, DO;  [facility];  Service: Endoscopy;  Laterality: N/A;   BIOPSY  10/15/2019   Procedure: BIOPSY;  Surgeon: Cindie Carlin POUR, DO;  [facility];  Service: Endoscopy;;   COLONOSCOPY  09/2008   Dr. Gregory: ulcers, edema, possible fistula openings all seen in the distal 3 cm of anus/anal canal. 1cm pseudopolyp. rest of colon and TI normal. rectal biopsy with mild chronic active colitis. ileum bx normal.   COLONOSCOPY WITH PROPOFOL  N/A 03/29/2015   SLF: Severe proctocolitis limitied to cecum and rectum. Limited exam of the colon mucosa and anal canal.    COLONOSCOPY WITH PROPOFOL  N/A 10/15/2019   Procedure: COLONOSCOPY WITH PROPOFOL ;  Surgeon: Cindie Carlin POUR, DO;  [facility];  Service: Endoscopy;  Laterality: N/A;  10:45am   ELBOW SURGERY     left   ESOPHAGOGASTRODUODENOSCOPY (EGD) WITH PROPOFOL  N/A 03/29/2015   SLF: 1. Erosive gastritis duodentitis.    ESOPHAGOGASTRODUODENOSCOPY (EGD) WITH PROPOFOL  N/A 10/15/2019   Procedure: ESOPHAGOGASTRODUODENOSCOPY (EGD) WITH PROPOFOL ;  Surgeon: Cindie Carlin POUR, DO;  [facility];  Service: Endoscopy;  Laterality: N/A;   RECTOVAGINAL FISTULA CLOSURE     did not help    Current Outpatient Medications  Medication Sig Dispense Refill   ALPRAZolam  (XANAX ) 1 MG tablet Take 1 tablet (1 mg total)  by mouth 2 (two) times daily. 30 tablet 2   benazepril  (LOTENSIN ) 20 MG tablet TAKE 1 TABLET BY MOUTH EVERY DAY 90 tablet 1   cyanocobalamin  (VITAMIN B12) 1000 MCG/ML injection INJECT 1ML INTO THE SKIN EVERY 14 DAYS 6 mL 5   HYDROcodone -acetaminophen  (NORCO) 10-325 MG tablet Take 1 tablet by mouth 3 times daily as needed for severe pain. 90 tablet 0   ketorolac  (TORADOL ) 10 MG tablet Take 1 tablet (10 mg total) by mouth every 6 (six) hours as needed for severe pain (pain score 7-10). 20 tablet 0   norethindrone  (AYGESTIN ) 5 MG tablet TAKE 1 TABLET (5 MG TOTAL) BY MOUTH DAILY. 90 tablet 2   pantoprazole  (PROTONIX ) 40 MG tablet Take 1 tablet (40 mg total) by mouth daily. TAKE 1 TABLET BY MOUTH EVERY DAY IN THE MORNING 90 tablet 3   potassium chloride  (KLOR-CON ) 10 MEQ tablet TAKE 1 TABLET BY MOUTH EVERY DAY 90 tablet 3   sertraline  (ZOLOFT ) 100 MG tablet Take 200 mg by mouth daily. (  Patient taking differently: Take 250 mg by mouth daily.)     SYRINGE-NEEDLE, DISP, 3 ML (B-D SYRINGE/NEEDLE 3CC/25GX5/8) 25G X 5/8 3 ML MISC Use to administer B12 injection every 14 days 24 each 0   VITAMIN D  PO Take by mouth. 5,000 units daily.     No current facility-administered medications for this visit.    Allergies as of 08/14/2023 - Review Complete 08/14/2023  Allergen Reaction Noted   Cephalexin Hives and Itching    Clindamycin  Diarrhea 03/03/2023   Codeine Nausea Only    Guaifenesin     Imitrex  [sumatriptan ]  10/17/2011   Lopressor  [metoprolol  tartrate] Nausea Only and Other (See Comments) 06/09/2020   Morphine     Remicade [infliximab]  05/26/2015   Sulfonamide derivatives     Ultram [tramadol hcl]  05/26/2015    Family History  Problem Relation Age of Onset   Breast cancer Maternal Grandmother    Early death Maternal Grandmother 7   Cancer Maternal Grandmother        breast   Cancer Maternal Grandfather        colon   Heart attack Father    Heart disease Father    Diabetes Mother     Heart disease Mother    Heart attack Mother    Ovarian cancer Maternal Aunt    Cancer Maternal Aunt        ovarian and cervical    Social History   Socioeconomic History   Marital status: Married    Spouse name: Not on file   Number of children: Not on file   Years of education: Not on file   Highest education level: Associate degree: occupational, Scientist, product/process development, or vocational program  Occupational History   Not on file  Tobacco Use   Smoking status: Former    Current packs/day: 0.00    Average packs/day: 0.5 packs/day for 25.0 years (12.5 ttl pk-yrs)    Types: Cigarettes    Start date: 12/03/1994    Quit date: 12/21/2018    Years since quitting: 4.6   Smokeless tobacco: Never  Vaping Use   Vaping status: Never Used  Substance and Sexual Activity   Alcohol use: No   Drug use: No   Sexual activity: Not Currently    Partners: Male    Birth control/protection: Pill  Other Topics Concern   Not on file  Social History Narrative   Regular Exercise-no   Social Drivers of Health   Financial Resource Strain: Low Risk  (04/01/2023)   Overall Financial Resource Strain (CARDIA)    Difficulty of Paying Living Expenses: Not very hard  Food Insecurity: No Food Insecurity (04/01/2023)   Hunger Vital Sign    Worried About Running Out of Food in the Last Year: Never true    Ran Out of Food in the Last Year: Never true  Transportation Needs: No Transportation Needs (04/01/2023)   PRAPARE - Administrator, Civil Service (Medical): No    Lack of Transportation (Non-Medical): No  Physical Activity: Insufficiently Active (04/01/2023)   Exercise Vital Sign    Days of Exercise per Week: 2 days    Minutes of Exercise per Session: 60 min  Stress: No Stress Concern Present (04/01/2023)   [employer] of Occupational Health - Occupational Stress Questionnaire    Feeling of Stress : Not at all  Social Connections: Moderately Integrated (04/01/2023)   Social Connection and Isolation  Panel    Frequency of Communication with Friends and Family: Three times  a week    Frequency of Social Gatherings with Friends and Family: Twice a week    Attends Religious Services: More than 4 times per year    Active Member of [employer] or Organizations: Yes    Attends Engineer, structural: More than 4 times per year    Marital Status: Separated    Subjective: Review of Systems  Constitutional:  Negative for chills and fever.  HENT:  Negative for congestion and hearing loss.   Eyes:  Negative for blurred vision and double vision.  Respiratory:  Negative for cough and shortness of breath.   Cardiovascular:  Negative for chest pain and palpitations.  Gastrointestinal:  Negative for abdominal pain, blood in stool, constipation, diarrhea, heartburn, melena and vomiting.  Genitourinary:  Negative for dysuria and urgency.  Musculoskeletal:  Negative for joint pain and myalgias.  Skin:  Negative for itching and rash.  Neurological:  Negative for dizziness and headaches.  Psychiatric/Behavioral:  Negative for depression. The patient is not nervous/anxious.      Objective: BP 120/82 (BP Location: Left Arm, Patient Position: Sitting, Cuff Size: Normal)   Pulse 99   Temp (!) 97.5 F (36.4 C) (Temporal)   Ht 5' 1 (1.549 m)   Wt 134 lb (60.8 kg)   BMI 25.32 kg/m  Physical Exam Constitutional:      Appearance: Normal appearance.  HENT:     Head: Normocephalic and atraumatic.   Eyes:     Extraocular Movements: Extraocular movements intact.     Conjunctiva/sclera: Conjunctivae normal.    Cardiovascular:     Rate and Rhythm: Normal rate and regular rhythm.     Heart sounds: Murmur heard.  Pulmonary:     Effort: Pulmonary effort is normal.     Breath sounds: Normal breath sounds.  Abdominal:     General: Bowel sounds are normal.     Palpations: Abdomen is soft.   Musculoskeletal:        General: No swelling. Normal range of motion.     Cervical back: Normal range of  motion and neck supple.   Skin:    General: Skin is warm and dry.     Coloration: Skin is not jaundiced.   Neurological:     General: No focal deficit present.     Mental Status: She is alert and oriented to person, place, and time.   Psychiatric:        Mood and Affect: Mood normal.        Behavior: Behavior normal.      Assessment: *Ileocolonic Crohn's disease-chronic *Chronic reflux-well controlled on daily PPI therapy *Cellulitis/abscess of the left axilla   Plan:  In regards to patient's ileocolonic Crohn's, patient previously appeared to be in both clinical and endoscopic remission at this time.   Patient took it upon herself to stop her Entyvio  March 2023 as she wanted to trial off any medication for Crohn's.  I discussed that by doing so we run the risk of flaring up her Crohn's disease and she understands.   Given her diarrhea and intermittent bleeding, I am concerned about possible Crohn's flare.  Will check fecal calprotectin.  Will order stool studies to rule out GI infection.  As far as this feeling of becoming septic, she does have a psych history including bipolar, depression, anxiety listed in her chart.  I have reached out to her PCP in this regard.  Will request ER records from [facility].  Follow-up in 2 months  08/14/2023 10:40  AM   Disclaimer: This note was dictated with voice recognition software. Similar sounding words can inadvertently be transcribed and may not be corrected upon review.

## 2023-08-14 NOTE — Patient Instructions (Signed)
 I am going to do stool testing today to check for infectious causes as well as check an inflammatory marker of your colon.  I am going to request your ER records from [facility].  I will also reach out to your PCP.  Follow-up in 2 to 3 months.  It was very nice seeing you again today.  Dr. Cindie

## 2023-08-18 LAB — GI PROFILE, STOOL, PCR

## 2023-08-19 ENCOUNTER — Encounter: Payer: Self-pay | Admitting: Internal Medicine

## 2023-08-20 LAB — CALPROTECTIN, FECAL: Calprotectin, Fecal: 35 ug/g (ref 0–120)

## 2023-08-21 ENCOUNTER — Ambulatory Visit (INDEPENDENT_AMBULATORY_CARE_PROVIDER_SITE_OTHER): Admitting: Internal Medicine

## 2023-08-21 ENCOUNTER — Encounter: Payer: Self-pay | Admitting: Internal Medicine

## 2023-08-21 VITALS — BP 110/60 | HR 98 | Temp 98.7°F | Ht 61.0 in | Wt 132.0 lb

## 2023-08-21 DIAGNOSIS — F17291 Nicotine dependence, other tobacco product, in remission: Secondary | ICD-10-CM

## 2023-08-21 DIAGNOSIS — N390 Urinary tract infection, site not specified: Secondary | ICD-10-CM | POA: Diagnosis not present

## 2023-08-21 DIAGNOSIS — E559 Vitamin D deficiency, unspecified: Secondary | ICD-10-CM | POA: Diagnosis not present

## 2023-08-21 DIAGNOSIS — N823 Fistula of vagina to large intestine: Secondary | ICD-10-CM

## 2023-08-21 DIAGNOSIS — K50013 Crohn's disease of small intestine with fistula: Secondary | ICD-10-CM | POA: Diagnosis not present

## 2023-08-21 DIAGNOSIS — F4321 Adjustment disorder with depressed mood: Secondary | ICD-10-CM

## 2023-08-21 LAB — COMPREHENSIVE METABOLIC PANEL WITH GFR
ALT: 11 U/L (ref 0–35)
AST: 12 U/L (ref 0–37)
Albumin: 4.6 g/dL (ref 3.5–5.2)
Alkaline Phosphatase: 57 U/L (ref 39–117)
BUN: 15 mg/dL (ref 6–23)
CO2: 26 meq/L (ref 19–32)
Calcium: 9.7 mg/dL (ref 8.4–10.5)
Chloride: 106 meq/L (ref 96–112)
Creatinine, Ser: 1.59 mg/dL — ABNORMAL HIGH (ref 0.40–1.20)
GFR: 36.82 mL/min — ABNORMAL LOW (ref >60.00)
Glucose, Bld: 91 mg/dL (ref 70–99)
Potassium: 3.6 meq/L (ref 3.5–5.1)
Sodium: 140 meq/L (ref 135–145)
Total Bilirubin: 0.8 mg/dL (ref 0.2–1.2)
Total Protein: 7 g/dL (ref 6.0–8.3)

## 2023-08-21 LAB — CBC WITH DIFFERENTIAL/PLATELET
Basophils Absolute: 0 10*3/uL (ref 0.0–0.1)
Basophils Relative: 0.4 % (ref 0.0–3.0)
Eosinophils Absolute: 0 10*3/uL (ref 0.0–0.7)
Eosinophils Relative: 0.6 % (ref 0.0–5.0)
HCT: 41.8 % (ref 36.0–46.0)
Hemoglobin: 14.2 g/dL (ref 12.0–15.0)
Lymphocytes Relative: 18.8 % (ref 12.0–46.0)
Lymphs Abs: 1.6 10*3/uL (ref 0.7–4.0)
MCHC: 34 g/dL (ref 30.0–36.0)
MCV: 86.5 fl (ref 78.0–100.0)
Monocytes Absolute: 0.5 10*3/uL (ref 0.1–1.0)
Monocytes Relative: 6.3 % (ref 3.0–12.0)
Neutro Abs: 6.2 10*3/uL (ref 1.4–7.7)
Neutrophils Relative %: 73.9 % (ref 43.0–77.0)
Platelets: 123 10*3/uL — ABNORMAL LOW (ref 150.0–400.0)
RBC: 4.84 Mil/uL (ref 3.87–5.11)
RDW: 13.6 % (ref 11.5–15.5)
WBC: 8.3 10*3/uL (ref 4.0–10.5)

## 2023-08-21 LAB — URINALYSIS, ROUTINE W REFLEX MICROSCOPIC
Bilirubin Urine: NEGATIVE
Hgb urine dipstick: NEGATIVE
Nitrite: NEGATIVE
Specific Gravity, Urine: >=1.03 — AB (ref 1.000–1.030)
Total Protein, Urine: 30 — AB
Urine Glucose: NEGATIVE
Urobilinogen, UA: 2 — AB (ref 0.0–1.0)
pH: 6 (ref 5.0–8.0)

## 2023-08-21 LAB — VITAMIN B12: Vitamin B-12: 613 pg/mL (ref 211–911)

## 2023-08-21 LAB — VITAMIN D 25 HYDROXY (VIT D DEFICIENCY, FRACTURES): VITD: 81.55 ng/mL (ref 30.00–100.00)

## 2023-08-21 NOTE — Assessment & Plan Note (Signed)
 Husband was diagnosed with renal cell cancer st 4 in Sep 19, 2022, died 5/205 Counseling was offered

## 2023-08-21 NOTE — Assessment & Plan Note (Addendum)
 F/u w/Dr Cindie:  Expand All Collapse All       Referring Provider: Hieu Herms, Karlynn GAILS, MD Primary Care Physician:  Garald Karlynn GAILS, MD Primary GI:  Dr. Cindie       Chief Complaint  Patient presents with   Follow-up      Patient here today for a follow up on Crohn's. Patient says she has been having issues with diarrhea,has seen bright red blood in stools abdominal pain. She is not currently taking any medications for her disease.       HPI:   [name] [name] is a 53 y.o. female who presents to the clinic today for follow up visit. 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  She established care with us  in January 2017.  She has previously failed Imuran, Humira, Asacol.  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  She was reportedly in a research trial at [facility] for stem cell research at one point.     According to gynecology note dated 05/09/2018 noted a draining rectovaginal fistula with left buttock fluid collection.  Cultures were sent and found to be staph aureus.  Entyvio  was held during this time which she has been maintained on since 2017.  CT pelvis in June showed evidence of rectovaginal fistula unchanged from 2018.  Patient was continued on Entyvio  with CT in December 10, 2018 which showed a nodular density posterior to the rectum likely representing scarring related to prior inflammatory changes collection.  No drainable fluid identified   Patient was hospitalized in October 2020 with sepsis due to E. coli bacteremia related to UTI as well as multifocal pneumonia and aspiration.   EGD and colonoscopy on 10/15/2019.  Her colon looked remarkably healthy with negative biopsies for any inflammation throughout her entire colon.  Her terminal ileum also looked healthy with negative biopsies as well.  She did have one small tubular  adenoma removed. Her EGD showed a Schatzki's ring in her distal esophagus which was dilated with an 18 mm balloon.  She also had gastritis.  Upper endoscopy biopsies were relatively unremarkable besides chronic gastritis.   March 2023, patient stopped her Entyvio  on her own.  She states that she wanted to trial off any medications for Crohn's.   CT abdomen pelvis 07/03/2021 showed no active Crohn's disease.  No evidence of rectovaginal fistula.   Today, she is complaining that she believes that she is becoming septic.  She recently lost her husband to stage IV renal cancer.  She states she was evaluated at [facility] and diagnosed with UTI and completed antibiotics for this.  Reports her dysuria is improved though she feels as though an infection is running through her entire body.  She is concerned that this is the same feeling she had prior to being admitted to the hospital for septic shock and respiratory failure 5 years ago.   From a GI standpoint, does note worsening diarrhea as well as intermittent bleeding.  No abdominal pain.  Did have some mucus a few weeks ago in her stool though this has resolved.     Procedures: EGD and colonoscopy on 10/15/2019.  Her colon looked remarkably healthy with negative biopsies for any inflammation throughout her entire colon.  Her terminal ileum also looked healthy with negative biopsies as well.  She did have one small tubular adenoma removed. Her EGD showed a Schatzki's ring in  her distal esophagus which was dilated with an 18 mm balloon.  She also had gastritis.  Upper endoscopy biopsies were relatively unremarkable besides chronic gastritis.   EGD and colonoscopy in February 2017 showed inflammation and ulceration in the cecum involving the IC valve which was unable to be intubated with colonoscope.  Also noted severe proctitis as well.  EGD showed erosive gastritis duodenitis.  Biopsies from cecal right colon with chronic active colitis and chronic inactive  colitis in the rectum.  Negative for dysplasia.

## 2023-08-21 NOTE — Progress Notes (Signed)
 Subjective:  Patient ID: [name] [name], female    DOB: 1970-03-25  Age: 53 y.o. MRN: [id number]  CC: Medical Management of Chronic Issues (Discuss recent UC visit and possibly being on meds)   HPI [name] [name] presents for night sweats, small cough, fever Pt went to UC 2 wks ago: labs, CXR were OK per pt Just had a R groin abscess - on Cipro  Not smoking x 2 weeks Seeing Dr Cindie - GI:  Expand All Collapse All       Referring Provider: Garald Karlynn GAILS, MD Primary Care Physician:  Garald Karlynn GAILS, MD Primary GI:  Dr. Cindie       Chief Complaint  Patient presents with   Follow-up      Patient here today for a follow up on Crohn's. Patient says she has been having issues with diarrhea,has seen bright red blood in stools abdominal pain. She is not currently taking any medications for her disease.       HPI:   [name] [name] is a 53 y.o. female who presents to the clinic today for follow up visit. 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  She established care with us  in January 2017.  She has previously failed Imuran, Humira, Asacol.  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  She was reportedly in a research trial at [facility] for stem cell research at one point.     According to gynecology note dated 05/09/2018 noted a draining rectovaginal fistula with left buttock fluid collection.  Cultures were sent and found to be staph aureus.  Entyvio  was held during this time which she has been maintained on since 2017.  CT pelvis in June showed evidence of rectovaginal fistula unchanged from 2018.  Patient was continued on Entyvio  with CT in December 10, 2018 which showed a nodular density posterior to the rectum likely representing scarring related to prior inflammatory changes collection.  No drainable fluid identified   Patient  was hospitalized in October 2020 with sepsis due to E. coli bacteremia related to UTI as well as multifocal pneumonia and aspiration.   EGD and colonoscopy on 10/15/2019.  Her colon looked remarkably healthy with negative biopsies for any inflammation throughout her entire colon.  Her terminal ileum also looked healthy with negative biopsies as well.  She did have one small tubular adenoma removed. Her EGD showed a Schatzki's ring in her distal esophagus which was dilated with an 18 mm balloon.  She also had gastritis.  Upper endoscopy biopsies were relatively unremarkable besides chronic gastritis.   March 2023, patient stopped her Entyvio  on her own.  She states that she wanted to trial off any medications for Crohn's.   CT abdomen pelvis 07/03/2021 showed no active Crohn's disease.  No evidence of rectovaginal fistula.   Today, she is complaining that she believes that she is becoming septic.  She recently lost her husband to stage IV renal cancer.  She states she was evaluated at [facility] ER and diagnosed with UTI and completed antibiotics for this.  Reports her dysuria is improved though she feels as though an infection is running through her entire body.  She is concerned that this is the same feeling she had prior to being admitted to the hospital for septic shock and respiratory failure 5 years ago.   From a GI standpoint, does note worsening diarrhea as  well as intermittent bleeding.  No abdominal pain.  Did have some mucus a few weeks ago in her stool though this has resolved.     Procedures: EGD and colonoscopy on 10/15/2019.  Her colon looked remarkably healthy with negative biopsies for any inflammation throughout her entire colon.  Her terminal ileum also looked healthy with negative biopsies as well.  She did have one small tubular adenoma removed. Her EGD showed a Schatzki's ring in her distal esophagus which was dilated with an 18 mm balloon.  She also had gastritis.  Upper endoscopy  biopsies were relatively unremarkable besides chronic gastritis.   EGD and colonoscopy in February 2017 showed inflammation and ulceration in the cecum involving the IC valve which was unable to be intubated with colonoscope.  Also noted severe proctitis as well.  EGD showed erosive gastritis duodenitis.  Biopsies from cecal right colon with chronic active colitis and chronic inactive colitis in the rectum.  Negative for dysplasia.    Outpatient Medications Prior to Visit  Medication Sig Dispense Refill   ALPRAZolam  (XANAX ) 1 MG tablet Take 1 tablet (1 mg total) by mouth 2 (two) times daily. 30 tablet 2   benazepril  (LOTENSIN ) 20 MG tablet TAKE 1 TABLET BY MOUTH EVERY DAY 90 tablet 1   cyanocobalamin  (VITAMIN B12) 1000 MCG/ML injection INJECT 1ML INTO THE SKIN EVERY 14 DAYS 6 mL 5   HYDROcodone -acetaminophen  (NORCO) 10-325 MG tablet Take 1 tablet by mouth 3 times daily as needed for severe pain. 90 tablet 0   ketorolac  (TORADOL ) 10 MG tablet Take 1 tablet (10 mg total) by mouth every 6 (six) hours as needed for severe pain (pain score 7-10). 20 tablet 0   norethindrone  (AYGESTIN ) 5 MG tablet TAKE 1 TABLET (5 MG TOTAL) BY MOUTH DAILY. 90 tablet 2   pantoprazole  (PROTONIX ) 40 MG tablet Take 1 tablet (40 mg total) by mouth daily. TAKE 1 TABLET BY MOUTH EVERY DAY IN THE MORNING 90 tablet 3   potassium chloride  (KLOR-CON ) 10 MEQ tablet TAKE 1 TABLET BY MOUTH EVERY DAY 90 tablet 3   sertraline  (ZOLOFT ) 100 MG tablet Take 200 mg by mouth daily. (Patient taking differently: Take 250 mg by mouth daily.)     SYRINGE-NEEDLE, DISP, 3 ML (B-D SYRINGE/NEEDLE 3CC/25GX5/8) 25G X 5/8 3 ML MISC Use to administer B12 injection every 14 days 24 each 0   VITAMIN D  PO Take by mouth. 5,000 units daily.     No facility-administered medications prior to visit.    ROS: Review of Systems  Constitutional:  Positive for chills, fatigue and unexpected weight change. Negative for activity change and appetite change.   HENT:  Negative for congestion, mouth sores and sinus pressure.   Eyes:  Negative for visual disturbance.  Respiratory:  Negative for cough and chest tightness.   Gastrointestinal:  Positive for diarrhea. Negative for abdominal pain and nausea.  Genitourinary:  Negative for difficulty urinating, frequency and vaginal pain.  Musculoskeletal:  Positive for back pain and gait problem.  Skin:  Negative for pallor and rash.  Neurological:  Negative for dizziness, tremors, weakness, numbness and headaches.  Psychiatric/Behavioral:  Negative for confusion and sleep disturbance. The patient is nervous/anxious.     Objective:  BP 110/60   Pulse 98   Temp 98.7 F (37.1 C) (Oral)   Ht 5' 1 (1.549 m)   Wt 132 lb (59.9 kg)   SpO2 98%   BMI 24.94 kg/m   BP Readings from Last 3 Encounters:  08/21/23  110/60  08/14/23 120/82  07/17/23 114/70    Wt Readings from Last 3 Encounters:  08/21/23 132 lb (59.9 kg)  08/14/23 134 lb (60.8 kg)  07/17/23 146 lb (66.2 kg)    Physical Exam Constitutional:      General: She is not in acute distress.    Appearance: She is well-developed. She is obese.  HENT:     Head: Normocephalic.     Right Ear: External ear normal.     Left Ear: External ear normal.     Nose: Nose normal.  Eyes:     General:        Right eye: No discharge.        Left eye: No discharge.     Conjunctiva/sclera: Conjunctivae normal.     Pupils: Pupils are equal, round, and reactive to light.  Neck:     Thyroid : No thyromegaly.     Vascular: No JVD.     Trachea: No tracheal deviation.  Cardiovascular:     Rate and Rhythm: Normal rate and regular rhythm.     Heart sounds: Normal heart sounds.  Pulmonary:     Effort: No respiratory distress.     Breath sounds: No stridor. No wheezing.  Abdominal:     General: Bowel sounds are normal. There is no distension.     Palpations: Abdomen is soft. There is no mass.     Tenderness: There is no abdominal tenderness. There is no  guarding or rebound.  Musculoskeletal:        General: No tenderness.     Cervical back: Normal range of motion and neck supple. No rigidity.     Right lower leg: No edema.     Left lower leg: No edema.  Lymphadenopathy:     Cervical: No cervical adenopathy.  Skin:    Findings: No erythema or rash.  Neurological:     Mental Status: She is oriented to person, place, and time.     Cranial Nerves: No cranial nerve deficit.     Motor: No abnormal muscle tone.     Coordination: Coordination normal.     Deep Tendon Reflexes: Reflexes normal.  Psychiatric:        Behavior: Behavior normal.        Thought Content: Thought content normal.        Judgment: Judgment normal.   Sad  Lab Results  Component Value Date   WBC 7.3 06/17/2023   HGB 13.2 06/17/2023   HCT 39.3 06/17/2023   PLT 126 (L) 06/17/2023   GLUCOSE 116 (H) 04/02/2023   CHOL 221 (H) 08/28/2022   TRIG 210.0 (H) 08/28/2022   HDL 31.00 (L) 08/28/2022   LDLDIRECT 169.0 08/28/2022   LDLCALC 121 (H) 08/30/2014   ALT 8 04/02/2023   AST 11 04/02/2023   NA 142 04/02/2023   K 4.0 04/02/2023   CL 105 04/02/2023   CREATININE 1.38 (H) 04/02/2023   BUN 18 04/02/2023   CO2 28 04/02/2023   TSH 1.469 01/03/2023   INR 1.1 12/05/2020   HGBA1C 4.9 10/11/2021    No results found.  Assessment & Plan:   Problem List Items Addressed This Visit     UTI - Primary   S/p ER visit Given Cipro  and Diflucan  Rx      Relevant Orders   Comprehensive metabolic panel with GFR   CBC with Differential/Platelet   Urinalysis   Vitamin B12   VITAMIN D  25 Hydroxy (Vit-D Deficiency, Fractures)   Crohn's  disease of ileum with fistula (HCC)   F/u w/Dr Cindie:  Expand All Collapse All       Referring Provider: Garald Karlynn GAILS, MD Primary Care Physician:  Garald Karlynn GAILS, MD Primary GI:  Dr. Cindie       Chief Complaint  Patient presents with   Follow-up      Patient here today for a follow up on Crohn's. Patient says  she has been having issues with diarrhea,has seen bright red blood in stools abdominal pain. She is not currently taking any medications for her disease.       HPI:   [name] [name] is a 53 y.o. female who presents to the clinic today for follow up visit. 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  She established care with us  in January 2017.  She has previously failed Imuran, Humira, Asacol.  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  She was reportedly in a research trial at [facility] for stem cell research at one point.     According to gynecology note dated 05/09/2018 noted a draining rectovaginal fistula with left buttock fluid collection.  Cultures were sent and found to be staph aureus.  Entyvio  was held during this time which she has been maintained on since 2017.  CT pelvis in June showed evidence of rectovaginal fistula unchanged from 2018.  Patient was continued on Entyvio  with CT in December 10, 2018 which showed a nodular density posterior to the rectum likely representing scarring related to prior inflammatory changes collection.  No drainable fluid identified   Patient was hospitalized in October 2020 with sepsis due to E. coli bacteremia related to UTI as well as multifocal pneumonia and aspiration.   EGD and colonoscopy on 10/15/2019.  Her colon looked remarkably healthy with negative biopsies for any inflammation throughout her entire colon.  Her terminal ileum also looked healthy with negative biopsies as well.  She did have one small tubular adenoma removed. Her EGD showed a Schatzki's ring in her distal esophagus which was dilated with an 18 mm balloon.  She also had gastritis.  Upper endoscopy biopsies were relatively unremarkable besides chronic gastritis.   March 2023, patient stopped her Entyvio  on her own.  She states that she wanted  to trial off any medications for Crohn's.   CT abdomen pelvis 07/03/2021 showed no active Crohn's disease.  No evidence of rectovaginal fistula.   Today, she is complaining that she believes that she is becoming septic.  She recently lost her husband to stage IV renal cancer.  She states she was evaluated at [facility] ER and diagnosed with UTI and completed antibiotics for this.  Reports her dysuria is improved though she feels as though an infection is running through her entire body.  She is concerned that this is the same feeling she had prior to being admitted to the hospital for septic shock and respiratory failure 5 years ago.   From a GI standpoint, does note worsening diarrhea as well as intermittent bleeding.  No abdominal pain.  Did have some mucus a few weeks ago in her stool though this has resolved.     Procedures: EGD and colonoscopy on 10/15/2019.  Her colon looked remarkably healthy with negative biopsies for any inflammation throughout her entire colon.  Her terminal ileum also looked healthy with negative biopsies as well.  She did have one small tubular  adenoma removed. Her EGD showed a Schatzki's ring in her distal esophagus which was dilated with an 18 mm balloon.  She also had gastritis.  Upper endoscopy biopsies were relatively unremarkable besides chronic gastritis.   EGD and colonoscopy in February 2017 showed inflammation and ulceration in the cecum involving the IC valve which was unable to be intubated with colonoscope.  Also noted severe proctitis as well.  EGD showed erosive gastritis duodenitis.  Biopsies from cecal right colon with chronic active colitis and chronic inactive colitis in the rectum.  Negative for dysplasia.        Relevant Orders   Comprehensive metabolic panel with GFR   CBC with Differential/Platelet   Urinalysis   Vitamin B12   VITAMIN D  25 Hydroxy (Vit-D Deficiency, Fractures)   Ambulatory referral to General Surgery   Vitamin D  deficiency    Relevant Orders   VITAMIN D  25 Hydroxy (Vit-D Deficiency, Fractures)   RVF (rectovaginal fistula)   Relevant Orders   Ambulatory referral to General Surgery   Nicotine dependence   Not smoking x 2 weeks      Grief   Husband was diagnosed with renal cell cancer st 4 in 09-03-2022, died 5/205 Counseling was offered          No orders of the defined types were placed in this encounter.     Follow-up: Return in about 6 weeks (around 10/02/2023) for a follow-up visit.  Marolyn Noel, MD

## 2023-08-21 NOTE — Assessment & Plan Note (Addendum)
Not smoking x2 weeks

## 2023-08-21 NOTE — Assessment & Plan Note (Signed)
 S/p ER visit Given Cipro  and Diflucan  Rx

## 2023-08-26 ENCOUNTER — Ambulatory Visit: Payer: Self-pay | Admitting: Internal Medicine

## 2023-08-28 NOTE — Telephone Encounter (Unsigned)
 Copied from CRM [id number]. Topic: General - Other >> Aug 28, 2023 12:21 PM Chiquita SQUIBB wrote: Reason for CRM: Patient is calling to leave the nurse a message for her to give the patients son a call back at [phone] regarding the FMLA paperwork that the nurse had a question about.

## 2023-08-29 ENCOUNTER — Encounter: Payer: Self-pay | Admitting: Internal Medicine

## 2023-08-29 NOTE — Telephone Encounter (Signed)
 Copied from CRM [id number]. Topic: General - Other >> Aug 29, 2023 11:56 AM Berneda FALCON wrote: This is a follow up to previous CRM. Pt would like the nurse (or staff) to call back to address this paperwork as there was a part on the forms that was missing or incorrect and they need these forms for the FMLA please.  Please call son back at [phone].

## 2023-08-30 NOTE — Telephone Encounter (Signed)
 Spoke wit pts son and was able to get information needed to correct pts FMLA.

## 2023-09-10 ENCOUNTER — Encounter: Payer: Self-pay | Admitting: Internal Medicine

## 2023-09-10 ENCOUNTER — Other Ambulatory Visit: Payer: Self-pay | Admitting: Internal Medicine

## 2023-09-10 NOTE — Telephone Encounter (Unsigned)
 Copied from CRM [id number]. Topic: Clinical - Medication Refill >> Sep 10, 2023  3:54 PM Shereese L wrote: Medication:  HYDROcodone -acetaminophen  (NORCO) 10-325 MG tablet     Has the patient contacted their pharmacy? Yes (Agent: If no, request that the patient contact the pharmacy for the refill. If patient does not wish to contact the pharmacy document the reason why and proceed with request.) (Agent: If yes, when and what did the pharmacy advise?)  This is the patient's preferred pharmacy:  [facility] [phone] - [place], [place] - [address] AT [address] [place] [address] Phone: [phone] Fax: [phone]  Is this the correct pharmacy for this prescription? Yes If no, delete pharmacy and type the correct one.   Has the prescription been filled recently? Yes  Is the patient out of the medication? Yes  Has the patient been seen for an appointment in the last year OR does the patient have an upcoming appointment? Yes  Can we respond through MyChart? Yes  Agent: Please be advised that Rx refills may take up to 3 business days. We ask that you follow-up with your pharmacy.

## 2023-09-16 ENCOUNTER — Encounter: Payer: Self-pay | Admitting: Cardiology

## 2023-09-16 ENCOUNTER — Other Ambulatory Visit: Payer: Self-pay | Admitting: Cardiology

## 2023-09-16 ENCOUNTER — Ambulatory Visit: Attending: Cardiology | Admitting: Cardiology

## 2023-09-16 ENCOUNTER — Other Ambulatory Visit: Payer: Self-pay | Admitting: Internal Medicine

## 2023-09-16 ENCOUNTER — Ambulatory Visit: Attending: Cardiology

## 2023-09-16 ENCOUNTER — Telehealth: Payer: Self-pay | Admitting: Cardiology

## 2023-09-16 VITALS — BP 108/64 | HR 97 | Ht 61.0 in | Wt 132.2 lb

## 2023-09-16 DIAGNOSIS — I421 Obstructive hypertrophic cardiomyopathy: Secondary | ICD-10-CM

## 2023-09-16 DIAGNOSIS — R002 Palpitations: Secondary | ICD-10-CM

## 2023-09-16 MED ORDER — HYDROCODONE-ACETAMINOPHEN 10-325 MG PO TABS: 1.0000 | ORAL_TABLET | Freq: Three times a day (TID) | ORAL | 0 refills | Status: DC | PRN

## 2023-09-16 NOTE — Telephone Encounter (Signed)
 Checking percert on the following    LONG TERM MONITOR XT (3-14 DAYS)

## 2023-09-16 NOTE — Progress Notes (Signed)
 Clinical Summary [name] [name] is a 53 y.o.female seen today for follow up of the following medical problems.      1. 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  03/2020 cMRI: sigmoid subtype HOCM, max thickness 18 mm with SAM of anterior MV leaflet and mod MR   - reported side effects to lopressor .   09/2021 echo: LVEF 55-60%, no WMAs, severe asymmetric septal hypertrophy. No gradient. Septum 1.6 cm.  09/2021 monitor: no NSVT -denies any SOB/DOE. Working to stay well hydrated. No recent chest pains. No syncope       2. Thrombocytopenia - followed by heme   3. Crohn's disease - followed by GI   4.CKD - followed by [facility]      SH: former EMT, phlebotomist, nurse  Husband recently passed away from renal cancer  Past Medical History:  Diagnosis Date   Anxiety    Arthritis    Dr. Deward   Bartholin cyst 2008   vag.   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   Depression    Elevated glucose 2010   GERD (gastroesophageal reflux disease)    Hypertension    Kidney stone    LBP (low back pain)    Dr. Cesario   Perianal abscess 2009   Vaginal Pap smear, abnormal    Vitamin B12 deficiency      Allergies  Allergen Reactions   Cephalexin Hives and Itching   Clindamycin  Diarrhea   Codeine Nausea Only   Guaifenesin    Imitrex  [Sumatriptan ]     2 fingers got very hot   Lopressor  [Metoprolol  Tartrate] Nausea Only and Other (See Comments)    Made her feel jittery, sweaty / reacted opposite on her.    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 30 tablet 2   benazepril  (LOTENSIN ) 20 MG tablet TAKE 1 TABLET BY MOUTH EVERY DAY 90 tablet 1   cyanocobalamin  (VITAMIN B12) 1000 MCG/ML injection INJECT 1ML INTO THE SKIN EVERY 14 DAYS 6 mL 5   HYDROcodone -acetaminophen  (NORCO) 10-325 MG tablet Take 1 tablet by mouth 3 times daily as needed for severe pain. 90 tablet 0   ketorolac  (TORADOL ) 10 MG tablet Take 1 tablet (10 mg total) by mouth every 6 (six) hours as needed for severe pain (pain score 7-10). 20 tablet 0   norethindrone  (AYGESTIN ) 5 MG tablet TAKE 1 TABLET (5 MG TOTAL) BY MOUTH DAILY. 90 tablet 2   pantoprazole  (PROTONIX ) 40 MG tablet Take 1 tablet (40 mg total) by mouth daily. TAKE 1 TABLET BY MOUTH EVERY DAY IN THE MORNING 90 tablet 3   potassium chloride  (KLOR-CON ) 10 MEQ tablet TAKE 1 TABLET BY MOUTH EVERY DAY 90 tablet 3   sertraline  (ZOLOFT ) 100 MG tablet Take 200 mg by mouth daily. (Patient taking differently: Take 250 mg by  mouth daily.)     SYRINGE-NEEDLE, DISP, 3 ML (B-D SYRINGE/NEEDLE 3CC/25GX5/8) 25G X 5/8 3 ML MISC Use to administer B12 injection every 14 days 24 each 0   VITAMIN D  PO Take by mouth. 5,000 units daily.     No current facility-administered medications for this visit.     Past Surgical History:  Procedure Laterality Date   BALLOON DILATION N/A 10/15/2019   Procedure: BALLOON DILATION;  Surgeon: Cindie Carlin POUR, DO;  Location: [facility];  Service: Endoscopy;  Laterality: N/A;   BIOPSY  10/15/2019   Procedure: BIOPSY;  Surgeon: Cindie Carlin POUR, DO;  Location: [facility];  Service: Endoscopy;;   COLONOSCOPY  09/2008   Dr. Gregory: ulcers, edema, possible fistula openings all seen in the distal 3 cm of anus/anal canal. 1cm pseudopolyp. rest of colon and TI normal. rectal biopsy with mild chronic active colitis. ileum bx normal.   COLONOSCOPY  WITH PROPOFOL  N/A 03/29/2015   SLF: Severe proctocolitis limitied to cecum and rectum. Limited exam of the colon mucosa and anal canal.    COLONOSCOPY WITH PROPOFOL  N/A 10/15/2019   Procedure: COLONOSCOPY WITH PROPOFOL ;  Surgeon: Cindie Carlin POUR, DO;  Location: [facility];  Service: Endoscopy;  Laterality: N/A;  10:45am   ELBOW SURGERY     left   ESOPHAGOGASTRODUODENOSCOPY (EGD) WITH PROPOFOL  N/A 03/29/2015   SLF: 1. Erosive gastritis duodentitis.    ESOPHAGOGASTRODUODENOSCOPY (EGD) WITH PROPOFOL  N/A 10/15/2019   Procedure: ESOPHAGOGASTRODUODENOSCOPY (EGD) WITH PROPOFOL ;  Surgeon: Cindie Carlin POUR, DO;  Location: [facility];  Service: Endoscopy;  Laterality: N/A;   RECTOVAGINAL FISTULA CLOSURE     did not help     Allergies  Allergen Reactions   Cephalexin Hives and Itching   Clindamycin  Diarrhea   Codeine Nausea Only   Guaifenesin    Imitrex  [Sumatriptan ]     2 fingers got very hot   Lopressor  [Metoprolol  Tartrate] Nausea Only and Other (See Comments)    Made her feel jittery, sweaty / reacted opposite on her.    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 Yakel reports that she quit smoking about 4 years ago. Her smoking use included cigarettes. She started smoking about 28 years  ago. She has a 12.5 pack-year smoking history. She has never used smokeless tobacco. Ms. Mcguffee reports no history of alcohol use.     Physical Examination Today's Vitals   09/16/23 0815  BP: 108/64  Pulse: 97  SpO2: 98%  Weight: 132 lb 3.2 oz (60 kg)  Height: 5' 1 (1.549 m)   Body mass index is 24.98 kg/m.  Gen: resting comfortably, no acute distress HEENT: no scleral icterus, pupils equal round and reactive, no palptable cervical adenopathy,  CV: RRR, 2/6 systolic murmur rusb, no jvd Resp: Clear to auscultation bilaterally GI: abdomen is soft, non-tender, non-distended, normal bowel sounds, no hepatosplenomegaly MSK: extremities are warm, no edema.  Skin: warm, no rash Neuro:  no focal deficits Psych: appropriate affect   Diagnostic Studies  Jan 2022 echo IMPRESSIONS     1. Left ventricular ejection fraction, by estimation, is 60 to 65%. The  left ventricle has normal function. The left ventricle has no regional  wall motion abnormalities. There is moderate asymmetric left ventricular  hypertrophy of the septal segment  based on limited views (study measurements are incorrect). Left  ventricular diastolic parameters are indeterminate.   2. Right ventricular systolic function is normal. The right ventricular  size is normal.   3. The mitral valve is grossly normal. No evidence of mitral valve  regurgitation.   4. The aortic valve is tricuspid and appears to have normal cusp  excursion based on limited views. Aortic valve regurgitation is not  visualized. Increased velocity across LVOT and AV likely due to asymmetric  LV septal hypertrophy (possible SAM as well)  although cannot exclude other source of subvalvular stenosis. Aortic valve  mean gradient measures 35.0 mmHg. Aortic valve Vmax measures 4.24 m/s.  Consider cardiac MRI for further evaluation.   5. The inferior vena cava is normal in size with greater than 50%  respiratory variability, suggesting right  atrial pressure of 3 mmHg.      03/2020 cMRI   IMPRESSION: 1. Hypertrophic cardiomyopathy, sigmoid subtype. Maximal wall thickness 18 mm at basal anteroseptum.   2. 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   3. Focal delayed myocardial enhancement in basal anteroseptum. Total LGE is approximately 8% of myocardial mass.   4.  No LV apical aneurysm.   5.  Normal LV size and function, LVEF 57%.   6.  Normal RV size and function, RVEF 68%.   7. Resting first pass perfusion shows mild subendocardial perfusion defect in mid lateral wall. No associated wall motion abnormality.     09/2021 monitor 3 day monitor   Rare supraventricular ectopy in the form of isolated PACs. Two runs of SVT longest 5 beats.   Occasoinal ventricular ectopy in the form of isolated PVCs. Rare couplets, triplets.   No symptoms reported   Assessment and Plan   1. HOCM - has been asymptomatic.  - side effects to lopressor , now off. In absence of symptomst not imperative to find substitute, could consider diltiazem in the future.  - no ICD risk factors - we will repeat echo, home monitor - she does not have any children, has a half brother she will discuss her heart condition with and considerations for screening.  - EKG today shows NSR   2. HTN -at goal, continue current meds    F/u 1 year  Dorn PHEBE Ross, M.D.

## 2023-09-16 NOTE — Patient Instructions (Addendum)
 Medication Instructions:   Continue all current medications.   Labwork:  none  Testing/Procedures:  Your physician has requested that you have an echocardiogram. Echocardiography is a painless test that uses sound waves to create images of your heart. It provides your doctor with information about the size and shape of your heart and how well your heart's chambers and valves are working. This procedure takes approximately one hour. There are no restrictions for this procedure. Please do NOT wear cologne, perfume, aftershave, or lotions (deodorant is allowed). Please arrive 15 minutes prior to your appointment time.  Please note: We ask at that you not bring children with you during ultrasound (echo/ vascular) testing. Due to room size and safety concerns, children are not allowed in the ultrasound rooms during exams. Our front office staff cannot provide observation of children in our lobby area while testing is being conducted. An adult accompanying a patient to their appointment will only be allowed in the ultrasound room at the discretion of the ultrasound technician under special circumstances. We apologize for any inconvenience. Your physician has recommended that you wear a 3 day event monitor. Event monitors are medical devices that record the heart's electrical activity. Doctors most often us  these monitors to diagnose arrhythmias. Arrhythmias are problems with the speed or rhythm of the heartbeat. The monitor is a small, portable device. You can wear one while you do your normal daily activities. This is usually used to diagnose what is causing palpitations/syncope (passing out). Office will contact with results via phone, letter or mychart.     Follow-Up:  Your physician wants you to follow up in:  1 year.  You should receive a recall letter in the mail about 2 months prior to the time you are due.  If you don't receive this, please call our office to schedule your follow up  appointment.      Any Other Special Instructions Will Be Listed Below (If Applicable).   If you need a refill on your cardiac medications before your next appointment, please call your pharmacy.

## 2023-09-20 ENCOUNTER — Encounter: Payer: Self-pay | Admitting: Internal Medicine

## 2023-09-20 ENCOUNTER — Ambulatory Visit: Admitting: Internal Medicine

## 2023-09-20 VITALS — BP 134/85 | HR 89 | Temp 98.1°F | Ht 61.0 in | Wt 132.0 lb

## 2023-09-20 DIAGNOSIS — R110 Nausea: Secondary | ICD-10-CM | POA: Diagnosis not present

## 2023-09-20 DIAGNOSIS — F419 Anxiety disorder, unspecified: Secondary | ICD-10-CM

## 2023-09-20 DIAGNOSIS — N183 Chronic kidney disease, stage 3 unspecified: Secondary | ICD-10-CM | POA: Diagnosis not present

## 2023-09-20 DIAGNOSIS — F4321 Adjustment disorder with depressed mood: Secondary | ICD-10-CM

## 2023-09-20 DIAGNOSIS — E538 Deficiency of other specified B group vitamins: Secondary | ICD-10-CM | POA: Diagnosis not present

## 2023-09-20 DIAGNOSIS — E559 Vitamin D deficiency, unspecified: Secondary | ICD-10-CM

## 2023-09-20 DIAGNOSIS — N824 Other female intestinal-genital tract fistulae: Secondary | ICD-10-CM

## 2023-09-20 MED ORDER — HYDROCODONE-ACETAMINOPHEN 10-325 MG PO TABS: 1.0000 | ORAL_TABLET | Freq: Three times a day (TID) | ORAL | 0 refills | Status: DC | PRN

## 2023-09-20 MED ORDER — PROMETHAZINE HCL 25 MG PO TABS: 25.0000 mg | ORAL_TABLET | Freq: Four times a day (QID) | ORAL | 1 refills | Status: DC | PRN

## 2023-09-20 MED ORDER — CIPROFLOXACIN HCL 500 MG PO TABS: 500.0000 mg | ORAL_TABLET | Freq: Two times a day (BID) | ORAL | 0 refills | Status: AC

## 2023-09-20 NOTE — Assessment & Plan Note (Signed)
 On B12

## 2023-09-20 NOTE — Assessment & Plan Note (Signed)
 GFR 36 No NSAIDs Monitoring GFR Hydrate well

## 2023-09-20 NOTE — Progress Notes (Signed)
 Subjective:  Patient ID: [name] [name], female    DOB: November 17, 1970  Age: 53 y.o. MRN: [id number]  CC: Medical Management of Chronic Issues (Discuss upcoming surgery)   HPI [name] [name] presents for LBP, grief C/o nausea, insomnia, fistula acting up  Outpatient Medications Prior to Visit  Medication Sig Dispense Refill   ALPRAZolam  (XANAX ) 1 MG tablet Take 1 tablet (1 mg total) by mouth 2 (two) times daily. 30 tablet 2   benazepril  (LOTENSIN ) 20 MG tablet TAKE 1 TABLET BY MOUTH EVERY DAY 90 tablet 1   cyanocobalamin  (VITAMIN B12) 1000 MCG/ML injection INJECT 1ML INTO THE SKIN EVERY 14 DAYS 6 mL 5   HYDROcodone -acetaminophen  (NORCO) 10-325 MG tablet Take 1 tablet by mouth 3 times daily as needed for severe pain. 90 tablet 0   ketorolac  (TORADOL ) 10 MG tablet Take 1 tablet (10 mg total) by mouth every 6 (six) hours as needed for severe pain (pain score 7-10). 20 tablet 0   levocetirizine (XYZAL) 5 MG tablet Take 5 mg by mouth daily.     montelukast (SINGULAIR) 10 MG tablet Take 10 mg by mouth daily.     norethindrone  (AYGESTIN ) 5 MG tablet TAKE 1 TABLET (5 MG TOTAL) BY MOUTH DAILY. 90 tablet 2   pantoprazole  (PROTONIX ) 40 MG tablet Take 1 tablet (40 mg total) by mouth daily. TAKE 1 TABLET BY MOUTH EVERY DAY IN THE MORNING 90 tablet 3   potassium chloride  (KLOR-CON ) 10 MEQ tablet TAKE 1 TABLET BY MOUTH EVERY DAY 90 tablet 3   sertraline  (ZOLOFT ) 100 MG tablet Take 200 mg by mouth daily. (Patient taking differently: Take 250 mg by mouth daily.)     SYRINGE-NEEDLE, DISP, 3 ML (B-D SYRINGE/NEEDLE 3CC/25GX5/8) 25G X 5/8 3 ML MISC Use to administer B12 injection every 14 days 24 each 0   VITAMIN D  PO Take by mouth. 5,000 units daily.     HYDROcodone -acetaminophen  (NORCO) 10-325 MG tablet Take 1 tablet by mouth 3 (three) times daily as needed for severe pain. 90 tablet 0   No facility-administered medications prior to visit.    ROS: Review of Systems  Constitutional:  Positive for  fatigue. Negative for activity change, appetite change, chills and unexpected weight change.  HENT:  Negative for congestion, mouth sores and sinus pressure.   Eyes:  Negative for visual disturbance.  Respiratory:  Negative for cough and chest tightness.   Gastrointestinal:  Negative for abdominal pain and nausea.  Genitourinary:  Negative for difficulty urinating, frequency and vaginal pain.  Musculoskeletal:  Positive for arthralgias and back pain. Negative for gait problem.  Skin:  Negative for pallor and rash.  Neurological:  Negative for dizziness, tremors, weakness, numbness and headaches.  Psychiatric/Behavioral:  Positive for dysphoric mood and sleep disturbance. Negative for confusion and suicidal ideas. The patient is nervous/anxious.     Objective:  BP 134/85   Pulse 89   Temp 98.1 F (36.7 C) (Oral)   Ht 5' 1 (1.549 m)   Wt 132 lb (59.9 kg)   SpO2 99%   BMI 24.94 kg/m   BP Readings from Last 3 Encounters:  09/20/23 134/85  09/16/23 108/64  08/21/23 110/60    Wt Readings from Last 3 Encounters:  09/20/23 132 lb (59.9 kg)  09/16/23 132 lb 3.2 oz (60 kg)  08/21/23 132 lb (59.9 kg)    Physical Exam Constitutional:      General: She is not in acute distress.    Appearance: Normal appearance. She is  well-developed.  HENT:     Head: Normocephalic.     Right Ear: External ear normal.     Left Ear: External ear normal.     Nose: Nose normal.  Eyes:     General:        Right eye: No discharge.        Left eye: No discharge.     Conjunctiva/sclera: Conjunctivae normal.     Pupils: Pupils are equal, round, and reactive to light.  Neck:     Thyroid : No thyromegaly.     Vascular: No JVD.     Trachea: No tracheal deviation.  Cardiovascular:     Rate and Rhythm: Normal rate and regular rhythm.     Heart sounds: Normal heart sounds.  Pulmonary:     Effort: No respiratory distress.     Breath sounds: No stridor. No wheezing.  Abdominal:     General: Bowel  sounds are normal. There is no distension.     Palpations: Abdomen is soft. There is no mass.     Tenderness: There is no abdominal tenderness. There is no guarding or rebound.  Musculoskeletal:        General: No tenderness.     Cervical back: Normal range of motion and neck supple. No rigidity.  Lymphadenopathy:     Cervical: No cervical adenopathy.  Skin:    Findings: No erythema or rash.  Neurological:     Cranial Nerves: No cranial nerve deficit.     Motor: No abnormal muscle tone.     Coordination: Coordination normal.     Deep Tendon Reflexes: Reflexes normal.  Psychiatric:        Behavior: Behavior normal.        Thought Content: Thought content normal.        Judgment: Judgment normal.   Monitor is on - Geoffry Lango looks sad  Lab Results  Component Value Date   WBC 8.3 08/21/2023   HGB 14.2 08/21/2023   HCT 41.8 08/21/2023   PLT 123.0 (L) 08/21/2023   GLUCOSE 91 08/21/2023   CHOL 221 (H) 08/28/2022   TRIG 210.0 (H) 08/28/2022   HDL 31.00 (L) 08/28/2022   LDLDIRECT 169.0 08/28/2022   LDLCALC 121 (H) 08/30/2014   ALT 11 08/21/2023   AST 12 08/21/2023   NA 140 08/21/2023   K 3.6 08/21/2023   CL 106 08/21/2023   CREATININE 1.59 (H) 08/21/2023   BUN 15 08/21/2023   CO2 26 08/21/2023   TSH 1.469 01/03/2023   INR 1.1 12/05/2020   HGBA1C 4.9 10/11/2021    No results found.  Assessment & Plan:   Problem List Items Addressed This Visit     Anxiety   Worse Husband was diagnosed with renal cell cancer st 4 in 2024      B12 deficiency   On B12      CRI (chronic renal insufficiency), stage 3 (moderate) (HCC)   GFR 36 No NSAIDs Monitoring GFR Hydrate well      Grief - Primary   Husband died in 5/205 Counseling was offered       Nausea   Zofran  or Promethazine  prn      RECTOVAGINAL FISTULA   Off Entyvio  Cipro  po  Rx      Vitamin D  deficiency   On Vit D         Meds ordered this encounter  Medications   promethazine  (PHENERGAN ) 25 MG  tablet    Sig: Take 1 tablet (25 mg  total) by mouth every 6 (six) hours as needed for up to 7 days for nausea or vomiting.    Dispense:  60 tablet    Refill:  1   HYDROcodone -acetaminophen  (NORCO) 10-325 MG tablet    Sig: Take 1 tablet by mouth 3 (three) times daily as needed for severe pain.    Dispense:  90 tablet    Refill:  0    Please fill on or after 11/09/23   ciprofloxacin  (CIPRO ) 500 MG tablet    Sig: Take 1 tablet (500 mg total) by mouth 2 (two) times daily for 10 days.    Dispense:  20 tablet    Refill:  0      Follow-up: Return in about 2 months (around 11/20/2023) for a follow-up visit.  Marolyn Noel, MD

## 2023-09-20 NOTE — Assessment & Plan Note (Addendum)
 Off Entyvio  Cipro  po  Rx

## 2023-09-20 NOTE — Assessment & Plan Note (Signed)
 On Vit D

## 2023-09-20 NOTE — Assessment & Plan Note (Signed)
 Zofran  or Promethazine  prn

## 2023-09-20 NOTE — Assessment & Plan Note (Signed)
Worse Husband was diagnosed with renal cell cancer st 4 in 2024

## 2023-09-20 NOTE — Assessment & Plan Note (Signed)
 Husband died in 5/205 Counseling was offered

## 2023-09-21 ENCOUNTER — Other Ambulatory Visit: Payer: Self-pay | Admitting: Cardiology

## 2023-09-24 ENCOUNTER — Ambulatory Visit

## 2023-09-26 ENCOUNTER — Ambulatory Visit: Admitting: Internal Medicine

## 2023-10-01 DIAGNOSIS — I421 Obstructive hypertrophic cardiomyopathy: Secondary | ICD-10-CM | POA: Diagnosis not present

## 2023-10-08 ENCOUNTER — Ambulatory Visit: Attending: Cardiology

## 2023-10-08 DIAGNOSIS — I421 Obstructive hypertrophic cardiomyopathy: Secondary | ICD-10-CM

## 2023-10-09 LAB — ECHOCARDIOGRAM COMPLETE
AV Peak grad: 22.5 mmHg
Ao pk vel: 2.37 m/s
Area-P 1/2: 2.82 cm2
Calc EF: 64.4 %
S' Lateral: 2.2 cm
Single Plane A2C EF: 68.6 %
Single Plane A4C EF: 59.7 %

## 2023-10-16 ENCOUNTER — Ambulatory Visit: Admitting: Internal Medicine

## 2023-10-16 ENCOUNTER — Ambulatory Visit: Payer: Self-pay | Admitting: Cardiology

## 2023-10-16 ENCOUNTER — Encounter: Payer: Self-pay | Admitting: *Deleted

## 2023-10-22 ENCOUNTER — Other Ambulatory Visit

## 2023-10-23 ENCOUNTER — Ambulatory Visit

## 2023-10-23 VITALS — Ht 61.0 in | Wt 132.0 lb

## 2023-10-23 DIAGNOSIS — Z0000 Encounter for general adult medical examination without abnormal findings: Secondary | ICD-10-CM

## 2023-10-23 NOTE — Progress Notes (Addendum)
 Subjective:   [name] [name] is a 53 y.o. who presents for a Medicare Wellness preventive visit.  As a reminder, Annual Wellness Visits don't include a physical exam, and some assessments may be limited, especially if this visit is performed virtually. We may recommend an in-person follow-up visit with your provider if needed.  Visit Complete: Virtual I connected with  [name] [name] on 10/23/23 by a audio enabled telemedicine application and verified that I am speaking with the correct person using two identifiers.  Patient Location: Home  Provider Location: Home Office  I discussed the limitations of evaluation and management by telemedicine. The patient expressed understanding and agreed to proceed.  Vital Signs: Because this visit was a virtual/telehealth visit, some criteria may be missing or patient reported. Any vitals not documented were not able to be obtained and vitals that have been documented are patient reported.  VideoDeclined- This patient declined Librarian, academic. Therefore the visit was completed with audio only.  Persons Participating in Visit: Patient.  AWV Questionnaire: No: Patient Medicare AWV questionnaire was not completed prior to this visit.  Cardiac Risk Factors include: hypertension;Other (see comment)     Objective:    Today's Vitals   10/23/23 0807  Weight: 132 lb (59.9 kg)  Height: 5' 1 (1.549 m)   Body mass index is 24.94 kg/m.     10/23/2023    8:14 AM 06/24/2023    8:05 AM 06/18/2022    8:16 AM 01/03/2022   11:19 AM 06/13/2021    8:12 AM 12/12/2020   10:20 AM 12/05/2020    8:10 AM  Advanced Directives  Does Patient Have a Medical Advance Directive? No No No No No No No  Would patient like information on creating a medical advance directive?  No - Patient declined No - Patient declined No - Patient declined No - Patient declined No - Patient declined No - Patient declined    Current Medications  (verified) Outpatient Encounter Medications as of 10/23/2023  Medication Sig   ALPRAZolam  (XANAX ) 1 MG tablet Take 1 tablet (1 mg total) by mouth 2 (two) times daily.   benazepril  (LOTENSIN ) 20 MG tablet TAKE 1 TABLET BY MOUTH EVERY DAY   cyanocobalamin  (VITAMIN B12) 1000 MCG/ML injection INJECT 1ML INTO THE SKIN EVERY 14 DAYS   HYDROcodone -acetaminophen  (NORCO) 10-325 MG tablet Take 1 tablet by mouth 3 times daily as needed for severe pain.   HYDROcodone -acetaminophen  (NORCO) 10-325 MG tablet Take 1 tablet by mouth 3 (three) times daily as needed for severe pain.   ketorolac  (TORADOL ) 10 MG tablet Take 1 tablet (10 mg total) by mouth every 6 (six) hours as needed for severe pain (pain score 7-10).   levocetirizine (XYZAL) 5 MG tablet Take 5 mg by mouth daily.   montelukast (SINGULAIR) 10 MG tablet Take 10 mg by mouth daily.   norethindrone  (AYGESTIN ) 5 MG tablet TAKE 1 TABLET (5 MG TOTAL) BY MOUTH DAILY.   pantoprazole  (PROTONIX ) 40 MG tablet Take 1 tablet (40 mg total) by mouth daily. TAKE 1 TABLET BY MOUTH EVERY DAY IN THE MORNING   potassium chloride  (KLOR-CON ) 10 MEQ tablet TAKE 1 TABLET BY MOUTH EVERY DAY   promethazine  (PHENERGAN ) 25 MG tablet Take 1 tablet (25 mg total) by mouth every 6 (six) hours as needed for up to 7 days for nausea or vomiting.   sertraline  (ZOLOFT ) 100 MG tablet Take 200 mg by mouth daily. (Patient taking differently: Take 250 mg by mouth daily.)  SYRINGE-NEEDLE, DISP, 3 ML (B-D SYRINGE/NEEDLE 3CC/25GX5/8) 25G X 5/8 3 ML MISC Use to administer B12 injection every 14 days   VITAMIN D  PO Take by mouth. 5,000 units daily.   No facility-administered encounter medications on file as of 10/23/2023.    Allergies (verified) Cephalexin, Clindamycin , Codeine, Guaifenesin, Imitrex  [sumatriptan ], Lopressor  [metoprolol  tartrate], Morphine, Remicade [infliximab], Sulfonamide derivatives, and Ultram [tramadol hcl]   History: Past Medical History:  Diagnosis Date   Anxiety     Arthritis    Dr. Deward   Bartholin cyst 2008   vag.   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   Depression    Elevated glucose 2010   GERD (gastroesophageal reflux disease)    Hypertension    Kidney stone    LBP (low back pain)    Dr. Cesario   Perianal abscess 2009   Vaginal Pap smear, abnormal    Vitamin B12 deficiency    Past Surgical History:  Procedure Laterality Date   BALLOON DILATION N/A 10/15/2019   Procedure: BALLOON DILATION;  Surgeon: Cindie Carlin POUR, DO;  [facility]: [facility];  Service: Endoscopy;  Laterality: N/A;   BIOPSY  10/15/2019   Procedure: BIOPSY;  Surgeon: Cindie Carlin POUR, DO;  Location: [facility];  Service: Endoscopy;;   cataract surgery Bilateral 06/2023   COLONOSCOPY  09/19/2008   Dr. Gregory: ulcers, edema, possible fistula openings all seen in the distal 3 cm of anus/anal canal. 1cm pseudopolyp. rest of colon and TI normal. rectal biopsy with mild chronic active colitis. ileum bx normal.   COLONOSCOPY WITH PROPOFOL  N/A 03/29/2015   SLF: Severe proctocolitis limitied to cecum and rectum. Limited exam of the colon mucosa and anal canal.    COLONOSCOPY WITH PROPOFOL  N/A 10/15/2019   Procedure: COLONOSCOPY WITH PROPOFOL ;  Surgeon: Cindie Carlin POUR, DO;  Location: [facility];  Service: Endoscopy;  Laterality: N/A;  10:45am   ELBOW SURGERY     left   ESOPHAGOGASTRODUODENOSCOPY (EGD) WITH PROPOFOL  N/A 03/29/2015   SLF: 1. Erosive gastritis duodentitis.    ESOPHAGOGASTRODUODENOSCOPY (EGD) WITH PROPOFOL  N/A 10/15/2019   Procedure: ESOPHAGOGASTRODUODENOSCOPY (EGD) WITH PROPOFOL ;  Surgeon: Cindie Carlin POUR, DO;  Location: [facility];  Service: Endoscopy;  Laterality: N/A;   RECTOVAGINAL FISTULA CLOSURE     did not help   Family History  Problem Relation Age of Onset   Breast cancer Maternal Grandmother    Early death Maternal  Grandmother 24   Cancer Maternal Grandmother        breast   Cancer Maternal Grandfather        colon   Heart attack Father    Heart disease Father    Diabetes Mother    Heart disease Mother    Heart attack Mother    Ovarian cancer Maternal Aunt    Cancer Maternal Aunt        ovarian and cervical   Social History   Socioeconomic History   Marital status: Widowed    Spouse name: Not on file   Number of children: Not on file   Years of education: Not on file   Highest education level: Associate degree: occupational, Scientist, product/process development, or vocational program  Occupational History   Occupation: disabled  Tobacco Use   Smoking status: Former    Current packs/day: 0.00    Average packs/day: 0.5 packs/day for 25.0 years (12.5 ttl pk-yrs)  Types: Cigarettes    Start date: 12/03/1994    Quit date: 12/21/2018    Years since quitting: 4.8   Smokeless tobacco: Never  Vaping Use   Vaping status: Never Used  Substance and Sexual Activity   Alcohol use: No   Drug use: No   Sexual activity: Not Currently    Partners: Male    Birth control/protection: Pill  Other Topics Concern   Not on file  Social History Narrative   Regular Exercise-no   Lives alone/2025   Social Drivers of Health   Financial Resource Strain: Low Risk  (10/23/2023)   Overall Financial Resource Strain (CARDIA)    Difficulty of Paying Living Expenses: Not hard at all  Food Insecurity: No Food Insecurity (10/23/2023)   Hunger Vital Sign    Worried About Running Out of Food in the Last Year: Never true    Ran Out of Food in the Last Year: Never true  Transportation Needs: No Transportation Needs (10/23/2023)   PRAPARE - Administrator, Civil Service (Medical): No    Lack of Transportation (Non-Medical): No  Physical Activity: Sufficiently Active (10/23/2023)   Exercise Vital Sign    Days of Exercise per Week: 3 days    Minutes of Exercise per Session: 120 min  Recent Concern: Physical Activity -  Insufficiently Active (08/20/2023)   Exercise Vital Sign    Days of Exercise per Week: 3 days    Minutes of Exercise per Session: 20 min  Stress: No Stress Concern Present (10/23/2023)   [employer] of Occupational Health - Occupational Stress Questionnaire    Feeling of Stress: Not at all  Social Connections: Moderately Integrated (10/23/2023)   Social Connection and Isolation Panel    Frequency of Communication with Friends and Family: More than three times a week    Frequency of Social Gatherings with Friends and Family: Once a week    Attends Religious Services: More than 4 times per year    Active Member of [employer] or Organizations: Yes    Attends Banker Meetings: Never    Marital Status: Widowed    Tobacco Counseling Counseling given: Not Answered    Clinical Intake:  Pre-visit preparation completed: Yes  Pain : No/denies pain     BMI - recorded: 24.94 Nutritional Status: BMI of 19-24  Normal Nutritional Risks: None Diabetes: No  Lab Results  Component Value Date   HGBA1C 4.9 10/11/2021   HGBA1C 4.9 07/18/2021     How often do you need to have someone help you when you read instructions, pamphlets, or other written materials from your doctor or pharmacy?: 1 - Never  Interpreter Needed?: No  Information entered by :: Asael Pann, RMA   Activities of Daily Living     10/23/2023    8:10 AM  In your present state of health, do you have any difficulty performing the following activities:  Hearing? 0  Vision? 0  Difficulty concentrating or making decisions? 0  Walking or climbing stairs? 0  Dressing or bathing? 0  Doing errands, shopping? 0  Preparing Food and eating ? N  Using the Toilet? N  In the past six months, have you accidently leaked urine? N  Do you have problems with loss of bowel control? N  Managing your Medications? N  Managing your Finances? N  Housekeeping or managing your Housekeeping? N    Patient Care Team: Plotnikov,  Karlynn GAILS, MD as PCP - General Branch, Dorn FALCON, MD as  PCP - Cardiology (Cardiology) Deward Specking, MD (Orthopedic Surgery) Darral Hacker, MD as Referring Physician (Surgical Oncology) Jolee Sharper, MD as Referring Physician (Pulmonary Disease) Cindie Carlin POUR, DO as Consulting Physician (Internal Medicine)  I have updated your Care Teams any recent Medical Services you may have received from other providers in the past year.     Assessment:   This is a routine wellness examination for Ariyel.  Hearing/Vision screen Hearing Screening - Comments:: Denies hearing difficulties   Vision Screening - Comments:: Wears eyeglasses/had cataract surgery/[facility] [place]   Goals Addressed             This Visit's Progress    Client understands the importance of follow-up with providers by attending scheduled visits.   On track    C/o knee pain B and the L elbow pain starting 2 wks ago. She saw ortho, had X rays and given Prednisone  taper. She got better, now the sx's are bad again. The joints are hot.  Then she saw her renal doctor - advised to get labs done Labs - Uric acid, ANA, RF Prednisone  10 mg: take 4 tabs a day x 3 days; then 3 tabs a day x 4 days; then 2 tabs a day x 4 days, then 1 tab a day x 6 days, then stop. Take pc.          Depression Screen     10/23/2023    8:18 AM 07/17/2023    7:50 AM 04/02/2023    8:09 AM 12/03/2022    8:00 AM 09/26/2022    8:29 AM 08/28/2022    7:50 AM 05/28/2022    8:03 AM  PHQ 2/9 Scores  PHQ - 2 Score 0 0 0 0 0 0 0  PHQ- 9 Score 0    1      Fall Risk     10/23/2023    8:15 AM 07/17/2023    7:50 AM 04/02/2023    8:09 AM 12/03/2022    8:00 AM 09/26/2022    8:36 AM  Fall Risk   Falls in the past year? 0 0 0 0 0  Number falls in past yr: 0 0 0 0 0  Injury with Fall? 0 0 0 0 0  Risk for fall due to :  No Fall Risks No Fall Risks No Fall Risks   Follow up Falls evaluation completed;Falls prevention discussed Falls evaluation  completed Falls evaluation completed Falls evaluation completed     MEDICARE RISK AT HOME:  Medicare Risk at Home Any stairs in or around the home?: Yes (3 steps coming into the home) If so, are there any without handrails?: No Home free of loose throw rugs in walkways, pet beds, electrical cords, etc?: Yes Adequate lighting in your home to reduce risk of falls?: Yes Life alert?: No Use of a cane, walker or w/c?: No Grab bars in the bathroom?: No Shower chair or bench in shower?: No Elevated toilet seat or a handicapped toilet?: No  TIMED UP AND GO:  Was the test performed?  No  Cognitive Function: Declined/Normal: No cognitive concerns noted by patient or family. Patient alert, oriented, able to answer questions appropriately and recall recent events. No signs of memory loss or confusion.        01/03/2022   11:32 AM  6CIT Screen  What Year? 0 points  What month? 0 points  What time? 0 points  Count back from 20 0 points  Months in reverse 0 points  Repeat phrase 0 points  Total Score 0 points    Immunizations Immunization History  Administered Date(s) Administered   H1N1 12/08/2007   Hepatitis A 01/27/2009   Hepatitis A, Adult 01/27/2009   Hepatitis B 07/25/1993, 08/24/1993, 01/23/1994   Hepatitis B, ADULT 08/24/1993, 01/23/1994   Influenza Split 11/24/2010, 11/27/2011, 11/12/2016   Influenza Whole 01/24/2009, 11/15/2009   Influenza,inj,Quad PF,6+ Mos 11/03/2012, 11/30/2013, 10/29/2014, 10/03/2015, 10/04/2016, 09/27/2017, 10/04/2018, 11/07/2020   Influenza-Unspecified 11/24/2010, 11/27/2011, 09/27/2017, 10/13/2018   Moderna Covid-19 Vaccine Bivalent Booster 20yrs & up 10/23/2020   Moderna Sars-Covid-2 Vaccination 04/21/2019, 05/19/2019, 11/20/2019, 05/28/2020   PPD Test 02/05/1997   Pfizer Covid-19 Vaccine Bivalent Booster 35yrs & up 10/23/2020   Pneumococcal Conjugate-13 03/02/2013   Pneumococcal Polysaccharide-23 12/29/2012   Rabies, IM 10/21/2000, 10/23/2000,  11/19/2000   Td 02/21/1998, 10/30/2016   Tdap 01/27/2009   Zoster Recombinant(Shingrix) 04/19/2020, 10/23/2020   Zoster, Live 04/29/2015    Screening Tests Health Maintenance  Topic Date Due   Pneumococcal Vaccine: 50+ Years (3 of 3 - PCV20 or PCV21) 12/29/2017   Medicare Annual Wellness (AWV)  01/04/2023   INFLUENZA VACCINE  09/20/2023   COVID-19 Vaccine (7 - 2025-26 season) 10/21/2023   MAMMOGRAM  09/25/2024   DTaP/Tdap/Td (4 - Td or Tdap) 10/31/2026   Cervical Cancer Screening (HPV/Pap Cotest)  09/26/2027   Colonoscopy  10/14/2029   Hepatitis B Vaccines 19-59 Average Risk  Completed   Hepatitis C Screening  Completed   HIV Screening  Completed   Zoster Vaccines- Shingrix  Completed   HPV VACCINES  Aged Out   Meningococcal B Vaccine  Aged Out    Health Maintenance  Health Maintenance Due  Topic Date Due   Pneumococcal Vaccine: 50+ Years (3 of 3 - PCV20 or PCV21) 12/29/2017   Medicare Annual Wellness (AWV)  01/04/2023   INFLUENZA VACCINE  09/20/2023   COVID-19 Vaccine (7 - 2025-26 season) 10/21/2023   Health Maintenance Items Addressed: See Nurse Notes at the end of this note  Additional Screening:  Vision Screening: Recommended annual ophthalmology exams for early detection of glaucoma and other disorders of the eye. Would you like a referral to an eye doctor? No    Dental Screening: Recommended annual dental exams for proper oral hygiene  Community Resource Referral / Chronic Care Management: CRR required this visit?  No   CCM required this visit?  No   Plan:    I have personally reviewed and noted the following in the patient's chart:   Medical and social history Use of alcohol, tobacco or illicit drugs  Current medications and supplements including opioid prescriptions. Patient is not currently taking opioid prescriptions. Functional ability and status Nutritional status Physical activity Advanced directives List of other  physicians Hospitalizations, surgeries, and ER visits in previous 12 months Vitals Screenings to include cognitive, depression, and falls Referrals and appointments  In addition, I have reviewed and discussed with patient certain preventive protocols, quality metrics, and best practice recommendations. A written personalized care plan for preventive services as well as general preventive health recommendations were provided to patient.   Ralphine Hinks L Armondo Cech, CMA   10/23/2023   After Visit Summary: (MyChart) Due to this being a telephonic visit, the after visit summary with patients personalized plan was offered to patient via MyChart   Notes: Patient is due for a pneumonia vaccine.  She also is due for a mammogram, which she stated that she will call them back to schedule her an appointment.  She had no other concerns  to address today.  Medical screening examination/treatment/procedure(s) were performed by non-physician practitioner and as supervising physician I was immediately available for consultation/collaboration.  I agree with above. Karlynn Noel, MD

## 2023-10-23 NOTE — Patient Instructions (Signed)
 [name] [name] , Thank you for taking time out of your busy schedule to complete your Annual Wellness Visit with me. I enjoyed our conversation and look forward to speaking with you again next year. I, as well as your care team,  appreciate your ongoing commitment to your health goals. Please review the following plan we discussed and let me know if I can assist you in the future. Your Game plan/ To Do List    Follow up Visits: We will see or speak with you next year for your Next Medicare AWV with our clinical staff Have you seen your provider in the last 6 months (3 months if uncontrolled diabetes)? Yes.  Last office visit on 09/20/2023.  Clinician Recommendations:  Aim for 30 minutes of exercise or brisk walking, 6-8 glasses of water , and 5 servings of fruits and vegetables each day. You are due for a pneumonia vaccine and can get that done at your local pharmacy.  Remember to call and get scheduled for your mammogram soon.  Stay strong!      This is a list of the screenings recommended for you:  Health Maintenance  Topic Date Due   Pneumococcal Vaccine for age over 86 (3 of 3 - PCV20 or PCV21) 12/29/2017   Medicare Annual Wellness Visit  01/04/2023   Flu Shot  09/20/2023   COVID-19 Vaccine (7 - 2025-26 season) 10/21/2023   Mammogram  09/25/2024   DTaP/Tdap/Td vaccine (4 - Td or Tdap) 10/31/2026   Pap with HPV screening  09/26/2027   Colon Cancer Screening  10/14/2029   Hepatitis B Vaccine  Completed   Hepatitis C Screening  Completed   HIV Screening  Completed   Zoster (Shingles) Vaccine  Completed   HPV Vaccine  Aged Out   Meningitis B Vaccine  Aged Out    Advanced directives: (Declined) Advance directive discussed with you today. Even though you declined this today, please call our office should you change your mind, and we can give you the proper paperwork for you to fill out. Advance Care Planning is important because it:  [x]  Makes sure you receive the medical care that is  consistent with your values, goals, and preferences  [x]  It provides guidance to your family and loved ones and reduces their decisional burden about whether or not they are making the right decisions based on your wishes.  Follow the link provided in your after visit summary or read over the paperwork we have mailed to you to help you started getting your Advance Directives in place. If you need assistance in completing these, please reach out to us  so that we can help you!  See attachments for Preventive Care and Fall Prevention Tips.

## 2023-10-31 ENCOUNTER — Encounter: Payer: Self-pay | Admitting: Internal Medicine

## 2023-10-31 ENCOUNTER — Other Ambulatory Visit: Payer: Self-pay | Admitting: Internal Medicine

## 2023-11-06 ENCOUNTER — Ambulatory Visit (INDEPENDENT_AMBULATORY_CARE_PROVIDER_SITE_OTHER): Admitting: Internal Medicine

## 2023-11-06 ENCOUNTER — Encounter: Payer: Self-pay | Admitting: Internal Medicine

## 2023-11-06 VITALS — BP 100/68 | HR 121 | Temp 98.4°F | Ht 61.0 in | Wt 132.4 lb

## 2023-11-06 DIAGNOSIS — E538 Deficiency of other specified B group vitamins: Secondary | ICD-10-CM

## 2023-11-06 DIAGNOSIS — E559 Vitamin D deficiency, unspecified: Secondary | ICD-10-CM | POA: Diagnosis not present

## 2023-11-06 DIAGNOSIS — R000 Tachycardia, unspecified: Secondary | ICD-10-CM

## 2023-11-06 DIAGNOSIS — G8929 Other chronic pain: Secondary | ICD-10-CM | POA: Diagnosis not present

## 2023-11-06 DIAGNOSIS — J690 Pneumonitis due to inhalation of food and vomit: Secondary | ICD-10-CM

## 2023-11-06 DIAGNOSIS — Z8701 Personal history of pneumonia (recurrent): Secondary | ICD-10-CM | POA: Diagnosis not present

## 2023-11-06 DIAGNOSIS — N183 Chronic kidney disease, stage 3 unspecified: Secondary | ICD-10-CM | POA: Diagnosis not present

## 2023-11-06 DIAGNOSIS — M5440 Lumbago with sciatica, unspecified side: Secondary | ICD-10-CM

## 2023-11-06 DIAGNOSIS — J449 Chronic obstructive pulmonary disease, unspecified: Secondary | ICD-10-CM | POA: Diagnosis not present

## 2023-11-06 LAB — COMPREHENSIVE METABOLIC PANEL WITH GFR
ALT: 14 U/L (ref 0–35)
AST: 15 U/L (ref 0–37)
Albumin: 4.3 g/dL (ref 3.5–5.2)
Alkaline Phosphatase: 55 U/L (ref 39–117)
BUN: 21 mg/dL (ref 6–23)
CO2: 25 meq/L (ref 19–32)
Calcium: 9.4 mg/dL (ref 8.4–10.5)
Chloride: 106 meq/L (ref 96–112)
Creatinine, Ser: 1.31 mg/dL — ABNORMAL HIGH (ref 0.40–1.20)
GFR: 46.38 mL/min — ABNORMAL LOW (ref >60.00)
Glucose, Bld: 114 mg/dL — ABNORMAL HIGH (ref 70–99)
Potassium: 3.9 meq/L (ref 3.5–5.1)
Sodium: 140 meq/L (ref 135–145)
Total Bilirubin: 1 mg/dL (ref 0.2–1.2)
Total Protein: 6.6 g/dL (ref 6.0–8.3)

## 2023-11-06 MED ORDER — CYCLOBENZAPRINE HCL 10 MG PO TABS: 10.0000 mg | ORAL_TABLET | Freq: Every day | ORAL | 5 refills | Status: AC

## 2023-11-06 NOTE — Assessment & Plan Note (Signed)
Pulm ref 

## 2023-11-06 NOTE — Assessment & Plan Note (Addendum)
 Off Vit D due to high levels - Summer 2025

## 2023-11-06 NOTE — Assessment & Plan Note (Signed)
 GFR 36 No NSAIDs Monitoring GFR Hydrate well

## 2023-11-06 NOTE — Assessment & Plan Note (Signed)
 On B12

## 2023-11-06 NOTE — Progress Notes (Signed)
 Subjective:  Patient ID: [name] [name], female    DOB: 01/08/1971  Age: 53 y.o. MRN: [id number]  CC: Medical Management of Chronic Issues (3 Month follow up)   HPI DESHANTA LADY presents for remote pneumonia she had, HTN, LBP, RVF On Prednisone  10 mg a day x 2 mo for Crohn's Pt had a Pneumonia in May: she was supposed to see a pulmonologist  Outpatient Medications Prior to Visit  Medication Sig Dispense Refill   ALPRAZolam  (XANAX ) 1 MG tablet Take 1 tablet (1 mg total) by mouth 2 (two) times daily. 30 tablet 2   benazepril  (LOTENSIN ) 20 MG tablet TAKE 1 TABLET BY MOUTH EVERY DAY 90 tablet 3   cyanocobalamin  (VITAMIN B12) 1000 MCG/ML injection INJECT 1ML INTO THE SKIN EVERY 14 DAYS 6 mL 5   HYDROcodone -acetaminophen  (NORCO) 10-325 MG tablet Take 1 tablet by mouth 3 times daily as needed for severe pain. 90 tablet 0   HYDROcodone -acetaminophen  (NORCO) 10-325 MG tablet Take 1 tablet by mouth 3 (three) times daily as needed for severe pain. 90 tablet 0   ketorolac  (TORADOL ) 10 MG tablet Take 1 tablet (10 mg total) by mouth every 6 (six) hours as needed for severe pain (pain score 7-10). 20 tablet 0   levocetirizine (XYZAL) 5 MG tablet Take 5 mg by mouth daily.     montelukast (SINGULAIR) 10 MG tablet Take 10 mg by mouth daily.     norethindrone  (AYGESTIN ) 5 MG tablet TAKE 1 TABLET (5 MG TOTAL) BY MOUTH DAILY. 90 tablet 2   pantoprazole  (PROTONIX ) 40 MG tablet Take 1 tablet (40 mg total) by mouth daily. TAKE 1 TABLET BY MOUTH EVERY DAY IN THE MORNING 90 tablet 3   potassium chloride  (KLOR-CON ) 10 MEQ tablet TAKE 1 TABLET BY MOUTH EVERY DAY 90 tablet 3   sertraline  (ZOLOFT ) 100 MG tablet Take 200 mg by mouth daily. (Patient taking differently: Take 250 mg by mouth daily.)     SYRINGE-NEEDLE, DISP, 3 ML (B-D SYRINGE/NEEDLE 3CC/25GX5/8) 25G X 5/8 3 ML MISC Use to administer B12 injection every 14 days 24 each 0   VITAMIN D  PO Take by mouth. 5,000 units daily.     promethazine  (PHENERGAN ) 25  MG tablet Take 1 tablet (25 mg total) by mouth every 6 (six) hours as needed for up to 7 days for nausea or vomiting. 60 tablet 1   No facility-administered medications prior to visit.    ROS: Review of Systems  Constitutional:  Negative for activity change, appetite change, chills, fatigue and unexpected weight change.  HENT:  Negative for congestion, mouth sores and sinus pressure.   Eyes:  Negative for visual disturbance.  Respiratory:  Negative for cough and chest tightness.   Gastrointestinal:  Negative for abdominal pain and nausea.  Genitourinary:  Positive for vaginal discharge. Negative for difficulty urinating, frequency and vaginal pain.  Musculoskeletal:  Positive for back pain. Negative for arthralgias and gait problem.  Skin:  Negative for pallor and rash.  Neurological:  Negative for dizziness, tremors, weakness, numbness and headaches.  Psychiatric/Behavioral:  Negative for confusion, sleep disturbance and suicidal ideas. The patient is nervous/anxious.     Objective:  BP 100/68   Pulse (!) 121   Temp 98.4 F (36.9 C)   Ht 5' 1 (1.549 m)   Wt 132 lb 6.4 oz (60.1 kg)   SpO2 99%   BMI 25.02 kg/m   BP Readings from Last 3 Encounters:  11/06/23 100/68  09/20/23 134/85  09/16/23  108/64    Wt Readings from Last 3 Encounters:  11/06/23 132 lb 6.4 oz (60.1 kg)  10/23/23 132 lb (59.9 kg)  09/20/23 132 lb (59.9 kg)    Physical Exam Constitutional:      General: She is not in acute distress.    Appearance: Normal appearance. She is well-developed.  HENT:     Head: Normocephalic.     Right Ear: External ear normal.     Left Ear: External ear normal.     Nose: Nose normal.  Eyes:     General:        Right eye: No discharge.        Left eye: No discharge.     Conjunctiva/sclera: Conjunctivae normal.     Pupils: Pupils are equal, round, and reactive to light.  Neck:     Thyroid : No thyromegaly.     Vascular: No JVD.     Trachea: No tracheal deviation.   Cardiovascular:     Rate and Rhythm: Regular rhythm. Tachycardia present.     Heart sounds: Normal heart sounds.  Pulmonary:     Effort: No respiratory distress.     Breath sounds: No stridor. No wheezing.  Abdominal:     General: Bowel sounds are normal. There is no distension.     Palpations: Abdomen is soft. There is no mass.     Tenderness: There is no abdominal tenderness. There is no guarding or rebound.  Musculoskeletal:        General: Tenderness present.     Cervical back: Normal range of motion and neck supple. No rigidity.  Lymphadenopathy:     Cervical: No cervical adenopathy.  Skin:    Findings: No erythema or rash.  Neurological:     Mental Status: She is oriented to person, place, and time.     Cranial Nerves: No cranial nerve deficit.     Motor: No abnormal muscle tone.     Coordination: Coordination normal.     Deep Tendon Reflexes: Reflexes normal.  Psychiatric:        Behavior: Behavior normal.        Thought Content: Thought content normal.        Judgment: Judgment normal.   LS w/pain  Lab Results  Component Value Date   WBC 8.3 08/21/2023   HGB 14.2 08/21/2023   HCT 41.8 08/21/2023   PLT 123.0 (L) 08/21/2023   GLUCOSE 91 08/21/2023   CHOL 221 (H) 08/28/2022   TRIG 210.0 (H) 08/28/2022   HDL 31.00 (L) 08/28/2022   LDLDIRECT 169.0 08/28/2022   LDLCALC 121 (H) 08/30/2014   ALT 11 08/21/2023   AST 12 08/21/2023   NA 140 08/21/2023   K 3.6 08/21/2023   CL 106 08/21/2023   CREATININE 1.59 (H) 08/21/2023   BUN 15 08/21/2023   CO2 26 08/21/2023   TSH 1.469 01/03/2023   INR 1.1 12/05/2020   HGBA1C 4.9 10/11/2021    No results found.  Assessment & Plan:   Problem List Items Addressed This Visit     Aspiration pneumonia (HCC)   B12 deficiency   On B12      COPD (chronic obstructive pulmonary disease) case management patient (HCC)   Probable COPD in a smoker. H/o RF due to aspiration in 2020 Pt was asked to stop smoking Pt declined flu,  pneumonia shots Pulm ref was made       Relevant Orders   Ambulatory referral to Pulmonology   CRI (chronic renal insufficiency),  stage 3 (moderate) (HCC) - Primary   GFR 36 No NSAIDs Monitoring GFR Hydrate well      Relevant Orders   Comprehensive metabolic panel with GFR   History of aspiration pneumonia   Pulm ref      LOW BACK PAIN, CHRONIC   Chronic and severe Norco tid prn.  Flexeril  prn - needs brand name On disabiity  Norco prn. Potential benefits of a long term opioids use as well as potential risks (i.e. addiction risk, apnea etc) and complications (i.e. Somnolence, constipation and others) were explained to the patient and were aknowledged.      Relevant Medications   cyclobenzaprine  (FLEXERIL ) 10 MG tablet   Tachycardia   Seeing Dr Alvan (Cardiology), had a monitor - ok      Vitamin D  deficiency   Off Vit D due to high levels - Summer 2025         Meds ordered this encounter  Medications   cyclobenzaprine  (FLEXERIL ) 10 MG tablet    Sig: Take 1 tablet (10 mg total) by mouth at bedtime.    Dispense:  30 tablet    Refill:  5    Has to be by [employer]      Follow-up: Return in about 3 months (around 02/05/2024) for a follow-up visit.  Marolyn Noel, MD

## 2023-11-06 NOTE — Assessment & Plan Note (Signed)
 Chronic and severe Norco tid prn.  Flexeril  prn - needs brand name On disabiity  Norco prn. Potential benefits of a long term opioids use as well as potential risks (i.e. addiction risk, apnea etc) and complications (i.e. Somnolence, constipation and others) were explained to the patient and were aknowledged.

## 2023-11-06 NOTE — Assessment & Plan Note (Signed)
 Seeing Dr Alvan (Cardiology), had a monitor - ok

## 2023-11-06 NOTE — Assessment & Plan Note (Signed)
 Probable COPD in a smoker. H/o RF due to aspiration in 2020 Pt was asked to stop smoking Pt declined flu, pneumonia shots Pulm ref was made

## 2023-11-08 ENCOUNTER — Other Ambulatory Visit: Payer: Self-pay | Admitting: Internal Medicine

## 2023-11-08 NOTE — Telephone Encounter (Signed)
 Copied from CRM [id number]. Topic: Clinical - Medication Refill >> Nov 08, 2023  9:02 AM Antwanette L wrote: Medication: HYDROcodone -acetaminophen  (NORCO) 10-325 MG tablet  Has the patient contacted their pharmacy? Yes  This is the patient's preferred pharmacy:  [facility] [phone] - [place], [place] - [address] AT [address] [place] [address] Phone: [phone] Fax: [phone]    Is this the correct pharmacy for this prescription? Yes   Has the prescription been filled recently? Yes. Last refill was on 09/20/23  Is the patient out of the medication? No  Has the patient been seen for an appointment in the last year OR does the patient have an upcoming appointment? Yes. Last ov w/ Dr. Garald was on 11/06/23 and next appt is 12/03/23  Can we respond through MyChart? Yes and by phone at [phone]  Agent: Please be advised that Rx refills may take up to 3 business days. We ask that you follow-up with your pharmacy.

## 2023-11-11 ENCOUNTER — Ambulatory Visit: Payer: Self-pay | Admitting: Internal Medicine

## 2023-11-11 MED ORDER — HYDROCODONE-ACETAMINOPHEN 10-325 MG PO TABS: 1.0000 | ORAL_TABLET | Freq: Three times a day (TID) | ORAL | 0 refills | Status: DC | PRN

## 2023-11-18 NOTE — Progress Notes (Unsigned)
 New Patient Pulmonology Office Visit   Subjective:  Patient ID: [name] [name], female    DOB: 1970-09-13  MRN: [id number]  Referred by: Garald Karlynn GAILS, MD  CC: No chief complaint on file.   HPI [name] [name] is a 53 y.o. female with a PMH significant for HTN, GERD, BPD who presents to establish care for suspected COPD.   CTD Symptoms:   PMH:  - Hx of TB ***  - Hx of endemic fungal infections ***  - Hx of Sarcoidosis ***  - Hx of CTD ***  - Hx of lymphoma or other hematological malignancies ***  - Hx of irradiation to the chest or immunotherapy ***   PSH:   Medications:   Allergies:   Social Hx: - Smoking ***  - Second hand smoking ***  - Vaping ***  - Alcohol ***  - Illicit substances ***  - Indoor emission from household combustion ***  - Hobbies (e.g. wood work, Surveyor, quantity, bird breeding etc...)  - Landscape architect (e.g. mold) ***  - Pets ***  - Birds ***   {Occupational Exposures:33652}  Family Hx:  - Lung disease ***  - CTD ***  - Sarcoidosis ***  - Cancer ***    {PULM QUESTIONNAIRES (Optional):33196}  ROS  Allergies: Cephalexin, Clindamycin , Codeine, Guaifenesin, Imitrex  [sumatriptan ], Lopressor  [metoprolol  tartrate], Morphine, Remicade [infliximab], Sulfonamide derivatives, and Ultram [tramadol hcl]  Current Outpatient Medications:    ALPRAZolam  (XANAX ) 1 MG tablet, Take 1 tablet (1 mg total) by mouth 2 (two) times daily., Disp: 30 tablet, Rfl: 2   benazepril  (LOTENSIN ) 20 MG tablet, TAKE 1 TABLET BY MOUTH EVERY DAY, Disp: 90 tablet, Rfl: 3   cyanocobalamin  (VITAMIN B12) 1000 MCG/ML injection, INJECT 1ML INTO THE SKIN EVERY 14 DAYS, Disp: 6 mL, Rfl: 5   cyclobenzaprine  (FLEXERIL ) 10 MG tablet, Take 1 tablet (10 mg total) by mouth at bedtime., Disp: 30 tablet, Rfl: 5   HYDROcodone -acetaminophen  (NORCO) 10-325 MG tablet, Take 1 tablet by mouth 3 (three) times daily as needed for severe pain., Disp: 90 tablet, Rfl: 0    HYDROcodone -acetaminophen  (NORCO) 10-325 MG tablet, Take 1 tablet by mouth 3 times daily as needed for severe pain., Disp: 90 tablet, Rfl: 0   ketorolac  (TORADOL ) 10 MG tablet, Take 1 tablet (10 mg total) by mouth every 6 (six) hours as needed for severe pain (pain score 7-10)., Disp: 20 tablet, Rfl: 0   levocetirizine (XYZAL) 5 MG tablet, Take 5 mg by mouth daily., Disp: , Rfl:    montelukast (SINGULAIR) 10 MG tablet, Take 10 mg by mouth daily., Disp: , Rfl:    norethindrone  (AYGESTIN ) 5 MG tablet, TAKE 1 TABLET (5 MG TOTAL) BY MOUTH DAILY., Disp: 90 tablet, Rfl: 2   pantoprazole  (PROTONIX ) 40 MG tablet, Take 1 tablet (40 mg total) by mouth daily. TAKE 1 TABLET BY MOUTH EVERY DAY IN THE MORNING, Disp: 90 tablet, Rfl: 3   potassium chloride  (KLOR-CON ) 10 MEQ tablet, TAKE 1 TABLET BY MOUTH EVERY DAY, Disp: 90 tablet, Rfl: 3   promethazine  (PHENERGAN ) 25 MG tablet, Take 1 tablet (25 mg total) by mouth every 6 (six) hours as needed for up to 7 days for nausea or vomiting., Disp: 60 tablet, Rfl: 1   sertraline  (ZOLOFT ) 100 MG tablet, Take 200 mg by mouth daily. (Patient taking differently: Take 250 mg by mouth daily.), Disp: , Rfl:    SYRINGE-NEEDLE, DISP, 3 ML (B-D SYRINGE/NEEDLE 3CC/25GX5/8) 25G X 5/8 3 ML MISC, Use to administer B12  injection every 14 days, Disp: 24 each, Rfl: 0   VITAMIN D  PO, Take by mouth. 5,000 units daily., Disp: , Rfl:  Past Medical History:  Diagnosis Date   Anxiety    Arthritis    Dr. Deward   Bartholin cyst 2008   vag.   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   Depression    Elevated glucose 2010   GERD (gastroesophageal reflux disease)    Hypertension    Kidney stone    LBP (low back pain)    Dr. Cesario   Perianal abscess 2009   Vaginal Pap smear, abnormal    Vitamin B12 deficiency    Past Surgical History:  Procedure Laterality Date   BALLOON  DILATION N/A 10/15/2019   Procedure: BALLOON DILATION;  Surgeon: Cindie Carlin POUR, DO;  [facility];  Service: Endoscopy;  Laterality: N/A;   BIOPSY  10/15/2019   Procedure: BIOPSY;  Surgeon: Cindie Carlin POUR, DO;  [facility]: [facility];  Service: Endoscopy;;   cataract surgery Bilateral 06/2023   COLONOSCOPY  09/19/2008   Dr. Gregory: ulcers, edema, possible fistula openings all seen in the distal 3 cm of anus/anal canal. 1cm pseudopolyp. rest of colon and TI normal. rectal biopsy with mild chronic active colitis. ileum bx normal.   COLONOSCOPY WITH PROPOFOL  N/A 03/29/2015   SLF: Severe proctocolitis limitied to cecum and rectum. Limited exam of the colon mucosa and anal canal.    COLONOSCOPY WITH PROPOFOL  N/A 10/15/2019   Procedure: COLONOSCOPY WITH PROPOFOL ;  Surgeon: Cindie Carlin POUR, DO;  [facility]: [facility];  Service: Endoscopy;  Laterality: N/A;  10:45am   ELBOW SURGERY     left   ESOPHAGOGASTRODUODENOSCOPY (EGD) WITH PROPOFOL  N/A 03/29/2015   SLF: 1. Erosive gastritis duodentitis.    ESOPHAGOGASTRODUODENOSCOPY (EGD) WITH PROPOFOL  N/A 10/15/2019   Procedure: ESOPHAGOGASTRODUODENOSCOPY (EGD) WITH PROPOFOL ;  Surgeon: Cindie Carlin POUR, DO;  [facility];  Service: Endoscopy;  Laterality: N/A;   RECTOVAGINAL FISTULA CLOSURE     did not help   Family History  Problem Relation Age of Onset   Breast cancer Maternal Grandmother    Early death Maternal Grandmother 70   Cancer Maternal Grandmother        breast   Cancer Maternal Grandfather        colon   Heart attack Father    Heart disease Father    Diabetes Mother    Heart disease Mother    Heart attack Mother    Ovarian cancer Maternal Aunt    Cancer Maternal Aunt        ovarian and cervical   Social History   Socioeconomic History   Marital status: Widowed    Spouse name: Not on file   Number of children: Not on file   Years of education: Not on file   Highest education level: Associate  degree: occupational, Scientist, product/process development, or vocational program  Occupational History   Occupation: disabled  Tobacco Use   Smoking status: Former    Current packs/day: 0.00    Average packs/day: 0.5 packs/day for 25.0 years (12.5 ttl pk-yrs)    Types: Cigarettes    Start date: 12/03/1994    Quit date: 12/21/2018    Years since quitting: 4.9   Smokeless tobacco: Never  Vaping Use   Vaping status: Never Used  Substance and Sexual Activity   Alcohol use: No  Drug use: No   Sexual activity: Not Currently    Partners: Male    Birth control/protection: Pill  Other Topics Concern   Not on file  Social History Narrative   Regular Exercise-no   Lives alone/2025   Social Drivers of Health   Financial Resource Strain: Low Risk  (10/23/2023)   Overall Financial Resource Strain (CARDIA)    Difficulty of Paying Living Expenses: Not hard at all  Food Insecurity: No Food Insecurity (10/23/2023)   Hunger Vital Sign    Worried About Running Out of Food in the Last Year: Never true    Ran Out of Food in the Last Year: Never true  Transportation Needs: No Transportation Needs (10/23/2023)   PRAPARE - Administrator, Civil Service (Medical): No    Lack of Transportation (Non-Medical): No  Physical Activity: Sufficiently Active (10/23/2023)   Exercise Vital Sign    Days of Exercise per Week: 3 days    Minutes of Exercise per Session: 120 min  Recent Concern: Physical Activity - Insufficiently Active (08/20/2023)   Exercise Vital Sign    Days of Exercise per Week: 3 days    Minutes of Exercise per Session: 20 min  Stress: No Stress Concern Present (10/23/2023)   [employer] of Occupational Health - Occupational Stress Questionnaire    Feeling of Stress: Not at all  Social Connections: Moderately Integrated (10/23/2023)   Social Connection and Isolation Panel    Frequency of Communication with Friends and Family: More than three times a week    Frequency of Social Gatherings with Friends and  Family: Once a week    Attends Religious Services: More than 4 times per year    Active Member of [employer] or Organizations: Yes    Attends Banker Meetings: Never    Marital Status: Widowed  Intimate Partner Violence: Patient Unable To Answer (10/23/2023)   Humiliation, Afraid, Rape, and Kick questionnaire    Fear of Current or Ex-Partner: Patient unable to answer    Emotionally Abused: Patient unable to answer    Physically Abused: Patient unable to answer    Sexually Abused: Patient unable to answer       Objective:  There were no vitals taken for this visit. {Pulm Vitals (Optional):32837}  Physical Exam  Diagnostic Review:  {Labs (Optional):32838}     Assessment & Plan:   Assessment & Plan   No orders of the defined types were placed in this encounter.     No follow-ups on file.   Woodley Petzold, MD

## 2023-11-19 ENCOUNTER — Ambulatory Visit (INDEPENDENT_AMBULATORY_CARE_PROVIDER_SITE_OTHER): Admitting: Pulmonary Disease

## 2023-11-19 ENCOUNTER — Encounter: Payer: Self-pay | Admitting: Pulmonary Disease

## 2023-11-19 VITALS — BP 126/83 | HR 117 | Ht 61.0 in | Wt 131.8 lb

## 2023-11-19 DIAGNOSIS — K50013 Crohn's disease of small intestine with fistula: Secondary | ICD-10-CM | POA: Diagnosis not present

## 2023-11-19 DIAGNOSIS — I421 Obstructive hypertrophic cardiomyopathy: Secondary | ICD-10-CM

## 2023-11-19 DIAGNOSIS — F17210 Nicotine dependence, cigarettes, uncomplicated: Secondary | ICD-10-CM | POA: Diagnosis not present

## 2023-11-19 DIAGNOSIS — J849 Interstitial pulmonary disease, unspecified: Secondary | ICD-10-CM

## 2023-11-19 NOTE — Assessment & Plan Note (Signed)
 Follows with GI

## 2023-11-19 NOTE — Patient Instructions (Signed)
-   I will get your CT chest, breathing test, and labs done - Once all of it is completed, come back and we will discuss next steps

## 2023-11-21 ENCOUNTER — Ambulatory Visit (INDEPENDENT_AMBULATORY_CARE_PROVIDER_SITE_OTHER)

## 2023-11-21 ENCOUNTER — Ambulatory Visit (HOSPITAL_COMMUNITY)
Admission: RE | Admit: 2023-11-21 | Discharge: 2023-11-21 | Disposition: A | Discharge status: Home / Self Care | Source: Ambulatory Visit | Attending: Pulmonary Disease | Admitting: Pulmonary Disease

## 2023-11-21 DIAGNOSIS — J849 Interstitial pulmonary disease, unspecified: Secondary | ICD-10-CM | POA: Insufficient documentation

## 2023-11-21 LAB — PULMONARY FUNCTION TEST
DL/VA % pred: 78 %
DL/VA: 3.39 ml/min/mmHg/L
DLCO unc % pred: 77 %
DLCO unc: 14.99 ml/min/mmHg
FEF 25-75 Pre: 2.32 L/s
FEF2575-%Pred-Pre: 91 %
FEV1-%Pred-Pre: 94 %
FEV1-Pre: 2.42 L
FEV1FVC-%Pred-Pre: 100 %
FEV6-%Pred-Pre: 95 %
FEV6-Pre: 3.02 L
FEV6FVC-%Pred-Pre: 103 %
FVC-%Pred-Pre: 93 %
FVC-Pre: 3.03 L
Pre FEV1/FVC ratio: 80 %
Pre FEV6/FVC Ratio: 100 %
RV % pred: 161 %
RV: 2.82 L
TLC % pred: 118 %
TLC: 5.64 L

## 2023-11-21 NOTE — Progress Notes (Signed)
 Full PFT w/o post bronchodilator performed today.

## 2023-11-21 NOTE — Patient Instructions (Signed)
 Full PFT w/o post bronchodilator performed today.

## 2023-11-22 ENCOUNTER — Ambulatory Visit: Payer: Self-pay | Admitting: Pulmonary Disease

## 2023-11-22 LAB — MICROSCOPIC EXAMINATION: Casts: NONE SEEN /LPF

## 2023-11-22 LAB — IGE: IgE (Immunoglobulin E), Serum: 2 [IU]/mL — ABNORMAL LOW (ref 6–495)

## 2023-11-22 LAB — COMPREHENSIVE METABOLIC PANEL WITH GFR
ALT: 9 IU/L (ref 0–32)
AST: 14 IU/L (ref 0–40)
Albumin: 4.4 g/dL (ref 3.8–4.9)
Alkaline Phosphatase: 79 IU/L (ref 49–135)
BUN/Creatinine Ratio: 14 (ref 9–23)
BUN: 22 mg/dL (ref 6–24)
Bilirubin Total: 0.6 mg/dL (ref 0.0–1.2)
CO2: 22 mmol/L (ref 20–29)
Calcium: 9.4 mg/dL (ref 8.7–10.2)
Chloride: 104 mmol/L (ref 96–106)
Creatinine, Ser: 1.57 mg/dL — ABNORMAL HIGH (ref 0.57–1.00)
Globulin, Total: 1.5 g/dL (ref 1.5–4.5)
Glucose: 85 mg/dL (ref 70–99)
Potassium: 3.7 mmol/L (ref 3.5–5.2)
Sodium: 143 mmol/L (ref 134–144)
Total Protein: 5.9 g/dL — ABNORMAL LOW (ref 6.0–8.5)
eGFR: 39 mL/min/1.73 — ABNORMAL LOW (ref >59)

## 2023-11-22 LAB — SJOGRENS SYNDROME-B EXTRACTABLE NUCLEAR ANTIBODY: ENA SSB (LA) Ab: <0.2 AI (ref 0.0–0.9)

## 2023-11-22 LAB — IGG, IGA, IGM
IgA/Immunoglobulin A, Serum: 124 mg/dL (ref 87–352)
IgG (Immunoglobin G), Serum: 531 mg/dL — ABNORMAL LOW (ref 586–1602)
IgM (Immunoglobulin M), Srm: 49 mg/dL (ref 26–217)

## 2023-11-22 LAB — CK: Total CK: 29 U/L — ABNORMAL LOW (ref 32–182)

## 2023-11-22 LAB — URINALYSIS, ROUTINE W REFLEX MICROSCOPIC
Bilirubin, UA: NEGATIVE
Glucose, UA: NEGATIVE
Nitrite, UA: NEGATIVE
RBC, UA: NEGATIVE
Specific Gravity, UA: 1.029 (ref 1.005–1.030)
Urobilinogen, Ur: 1 mg/dL (ref 0.2–1.0)
pH, UA: 5.5 (ref 5.0–7.5)

## 2023-11-22 LAB — ANGIOTENSIN CONVERTING ENZYME: Angio Convert Enzyme: 5 U/L — ABNORMAL LOW (ref 14–82)

## 2023-11-22 LAB — HYPERSENSITIVITY PNEUMONITIS
A. Pullulans Abs: NEGATIVE
A.Fumigatus #1 Abs: NEGATIVE
Micropolyspora faeni, IgG: NEGATIVE
Pigeon Serum Abs: NEGATIVE
Thermoact. Saccharii: NEGATIVE
Thermoactinomyces vulgaris, IgG: NEGATIVE

## 2023-11-22 LAB — RHEUMATOID FACTOR: Rheumatoid fact SerPl-aCnc: <10 [IU]/mL (ref <14.0)

## 2023-11-22 LAB — ANTI-SCLERODERMA ANTIBODY: Scleroderma (Scl-70) (ENA) Antibody, IgG: <0.2 AI (ref 0.0–0.9)

## 2023-11-22 LAB — ANTI-SMITH ANTIBODY: ENA SM Ab Ser-aCnc: <0.2 AI (ref 0.0–0.9)

## 2023-11-22 LAB — HIV ANTIBODY (ROUTINE TESTING W REFLEX): HIV Screen 4th Generation wRfx: NONREACTIVE

## 2023-11-22 LAB — ANA: Anti Nuclear Antibody (ANA): NEGATIVE

## 2023-11-22 LAB — SEDIMENTATION RATE: Sed Rate: 7 mm/h (ref 0–40)

## 2023-11-22 LAB — SJOGRENS SYNDROME-A EXTRACTABLE NUCLEAR ANTIBODY: ENA SSA (RO) Ab: 0.3 AI (ref 0.0–0.9)

## 2023-11-22 LAB — C-REACTIVE PROTEIN: CRP: 3 mg/L (ref 0–10)

## 2023-11-23 LAB — ANCA TITERS
Atypical pANCA: <1:20 {titer}
C-ANCA: <1:20 {titer}
P-ANCA: <1:20 {titer}

## 2023-11-23 LAB — QUANTIFERON-TB GOLD PLUS
QuantiFERON Mitogen Value: >10 [IU]/mL
QuantiFERON Nil Value: 0.03 [IU]/mL
QuantiFERON TB1 Ag Value: 0.04 [IU]/mL
QuantiFERON TB2 Ag Value: 0.04 [IU]/mL
QuantiFERON-TB Gold Plus: NEGATIVE

## 2023-11-25 ENCOUNTER — Ambulatory Visit: Payer: Self-pay | Admitting: Cardiology

## 2023-11-25 ENCOUNTER — Encounter: Payer: Self-pay | Admitting: *Deleted

## 2023-11-25 NOTE — Progress Notes (Signed)
 Called patient and explained to her that she has respiratory bronchiolitis from smoking. ILD work up negative. PFTs with elevated RV suggestive of air trapping. Suggested smoking cessation. Patient requested bronchoscopy but I don't see any targets to go after and I think I would subject her to more risk than benefit.

## 2023-12-03 ENCOUNTER — Ambulatory Visit: Admitting: Internal Medicine

## 2023-12-06 ENCOUNTER — Other Ambulatory Visit: Payer: Self-pay | Admitting: Adult Health

## 2023-12-12 ENCOUNTER — Encounter: Payer: Self-pay | Admitting: Nurse Practitioner

## 2023-12-12 ENCOUNTER — Ambulatory Visit: Attending: Nurse Practitioner | Admitting: Nurse Practitioner

## 2023-12-12 VITALS — BP 122/70 | HR 98 | Ht 61.0 in | Wt 132.0 lb

## 2023-12-12 DIAGNOSIS — N1832 Chronic kidney disease, stage 3b: Secondary | ICD-10-CM

## 2023-12-12 DIAGNOSIS — D696 Thrombocytopenia, unspecified: Secondary | ICD-10-CM

## 2023-12-12 DIAGNOSIS — F419 Anxiety disorder, unspecified: Secondary | ICD-10-CM | POA: Diagnosis not present

## 2023-12-12 DIAGNOSIS — I422 Other hypertrophic cardiomyopathy: Secondary | ICD-10-CM | POA: Diagnosis not present

## 2023-12-12 NOTE — Progress Notes (Signed)
 Cardiology Office Note   Date:  12/12/2023 ID:  [name] [name], DOB [birth year], MRN [id number] PCP: Garald Karlynn GAILS, MD  [name] [name] Cardiologist:  Alvan Carrier, MD     History of Present Illness [name] [name] is a 53 y.o. female with a PMH of hypertrophic cardiomyopathy, CKD, thrombocytopenia, and Crohn's disease, resents today for evaluation of PNA and states she is here to get pneumonia off of me.  Last seen by Dr. Alvan on 09/16/2023.  Was overall doing well at the time was asymptomatic.  It was noted she had previous side effects to Lopressor  and was off it.  Dr. Alvan stated that in absence of her no current symptoms at the time it was not felt to be imperative to find a substitute.  Recommended to consider diltiazem in the future.  Echo and home monitor were arranged-see reports below.  Today she is here for follow-up.  She states she is here to get pneumonia off of me.  She is found to be a poor historian.  Patient was very distracted throughout the interview and frequently brought up multiple unrelated aspects of her medical history.  She presented past paperwork from hospitalizations. One from 2020 and another hospitalization at [facility] in June 2025.  She requested evaluation due to a significant family history of cardiac disease-mother had a MI in her mid 77s, father underwent heart surgery in his early 2s passed away in early 76s. Her first paperwork that she has brought in for this visit is from October 2020 of [facility] in [place].  EKG shows heart rate 139 bpm showing sinus tachycardia.  Chest x-ray showed bilateral perihilar consolidation and right pleural effusion concerning for pulmonary edema, infection was not excluded.  CTA of chest was negative for any PE.  Echocardiogram at that time showed normal LVEF, normal right ventricular size with normal function.  BNP 591.  Discharge summary says she was admitted for severe sepsis with acute  hypoxic respiratory failure and suspected pneumonia with acute COPD exacerbation.   Was treated for pneumonia. Other paperwork has been reviewed from past hospitalization at [facility] in June 2025.  I see a chest x-ray that was negative for anything acute.  Appears she was treated for a UTI then.  EKG shows sinus tachycardia, heart rate 101 bpm.  Urine drug screen showed presumed positive for benzodiazepines and presumed positive for opiates also.  CMP was overall unremarkable.  Lactic acid normal and alcohol level normal.  Creatinine 1.56.  Today she denies any chest pain, shortness of breath, palpitations, syncope, presyncope, dizziness, orthopnea, PND, swelling or significant weight changes, acute bleeding, or claudication.  ROS: Negative. See HPI.   SH: Former Community Education Officer.  Studies Reviewed  EKG: EKG is not ordered today.   Echo 09/2023:  1. There is SAM of the anterior MV leaflet in the setting of moderate  asymmetrical septal hypertrophy and hyperdynamic LV function. With  Valsalva difficult to measure peak dynamic LV gradient but is elevated at  least . SABRA Left ventricular ejection  fraction, by estimation, is 65 to 70%. The left ventricle has normal  function. The left ventricle has no regional wall motion abnormalities.  There is moderate asymmetric left ventricular hypertrophy of the septal  segment. Left ventricular diastolic  parameters are consistent with Grade I diastolic dysfunction (impaired  relaxation). Elevated left atrial pressure.   2. Right ventricular systolic function is normal. The right ventricular  size is normal. Tricuspid regurgitation signal is  inadequate for assessing  PA pressure.   3. The mitral valve is normal in structure. Trivial mitral valve  regurgitation. No evidence of mitral stenosis.   4. The aortic valve is tricuspid. Aortic valve regurgitation is not  visualized. No aortic stenosis is present.   5. The inferior vena cava is normal in  size with greater than 50%  respiratory variability, suggesting right atrial pressure of 3 mmHg.   Comparison(s): A prior study was performed on 09/26/2021. EF 55-60%. Severe  asymmetric LVH of the basal segment. Mild to moderate MR. Mitral chordal  SAM.  Cardiac monitor 09/2023:    3 day monitor   Rare supraventricular ectopy in the form of isoalted PACs, triplets. Two runs of SVT lognest 6 beats.   Occasional ventricular ectopy in the form of isolated PVCs (3.1% burden). Rare couplets, triplets. Isolated 4 beat run of NSVT.   No symptoms reported     Patch Wear Time:  2 days and 13 hours (2025-07-31T07:21:28-0400 to 2025-08-02T20:59:15-0400)   Patient had a min HR of 57 bpm, max HR of 222 bpm, and avg HR of 99 bpm. Predominant underlying rhythm was Sinus Rhythm. 1 run of Ventricular Tachycardia occurred lasting 5 beats with a max rate of 179 bpm (avg 139 bpm). 2 Supraventricular Tachycardia  runs occurred, the run with the fastest interval lasting 4 beats with a max rate of 222 bpm, the longest lasting 6 beats with an avg rate of 155 bpm. Some episodes of Supraventricular Tachycardia may be possible Atrial Tachycardia with variable block.  Isolated SVEs were rare (<1.0%), SVE Triplets were rare (<1.0%), and no SVE Couplets were present. Isolated VEs were occasional (3.1%, 11108), VE Couplets were rare (<1.0%, 81), and VE Triplets were rare (<1.0%, 2). Ventricular Bigeminy and Trigeminy  were present.  cMRI 03/2020:  IMPRESSION: 1. Hypertrophic cardiomyopathy, sigmoid subtype. Maximal wall thickness 18 mm at basal anteroseptum.   2. 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   3. Focal delayed myocardial enhancement in basal anteroseptum. Total LGE is approximately 8% of myocardial mass.   4.  No LV apical aneurysm.   5.  Normal LV size and function, LVEF 57%.   6.  Normal RV size and function, RVEF  68%.   7. Resting first pass perfusion shows mild subendocardial perfusion defect in mid lateral wall. No associated wall motion abnormality.  Risk Assessment/Calculations     The 10-year ASCVD risk score (Arnett DK, et al., 2019) is: 10.7%   Values used to calculate the score:     Age: 81 years     Clincally relevant sex: Female     Is Non-Hispanic African American: No     Diabetic: No     Tobacco smoker: Yes     Systolic Blood Pressure: 122 mmHg     Is BP treated: Yes     HDL Cholesterol: 31 mg/dL     Total Cholesterol: 221 mg/dL  Physical Exam VS:  BP 122/70   Pulse 98   Ht 5' 1 (1.549 m)   Wt 132 lb (59.9 kg)   SpO2 98%   BMI 24.94 kg/m        Wt Readings from Last 3 Encounters:  12/12/23 132 lb (59.9 kg)  11/19/23 131 lb 12.8 oz (59.8 kg)  11/06/23 132 lb 6.4 oz (60.1 kg)    GEN: Well nourished, well developed in no acute distress, appears nervous and distracted NECK: No  JVD; No carotid bruits CARDIAC: S1/S2, RRR, no murmurs, rubs, gallops RESPIRATORY:  Clear to auscultation without rales, wheezing or rhonchi  ABDOMEN: Soft, non-tender, non-distended EXTREMITIES:  No edema; No deformity   ASSESSMENT AND PLAN  HCM Stage C, NYHA class I-II symptoms. EF 65-70%. Euvolemic and well compensated on exam. Most recent Echo showed SAM of anterior MV leaflet in setting of moderate asymmetrical septal hypertrophy and hyperdynamic LV function.  No medication changes at this time. Heart healthy diet and regular cardiovascular exercise encouraged.  I discussed with her that I did not hear any adventitious breath sounds on exam and I do not see any current reason to perform a chest x-ray or CT scan of her chest.  Recommended she follow-up her concerns with her primary care provider.   CKD stage IIIb Most recent labs show stable kidney function.  Avoid nephrotoxic agents.  No medication changes at this time.  Continue follow-up PCP.  Thrombocytopenia Chronic and stable.  Denies  any recent issues.  Continue follow-up with PCP.   4. Anxiety No red flag signs or symptoms.  I believe this is contributing to patient's chief complaint/concern today.  Recommended follow-up with PCP for further evaluation.   Dispo: Care and ED precautions discussed.  Follow-up with Dr. Alvan or APP in 6 months or sooner anything changes.  Signed, Almarie Crate, NP

## 2023-12-12 NOTE — Patient Instructions (Addendum)
Medication Instructions:  Your physician recommends that you continue on your current medications as directed. Please refer to the Current Medication list given to you today.  Labwork: None  Testing/Procedures: None  Follow-Up: Your physician recommends that you schedule a follow-up appointment in: 6 Months with Dr.Branch  Any Other Special Instructions Will Be Listed Below (If Applicable).  If you need a refill on your cardiac medications before your next appointment, please call your pharmacy.

## 2023-12-19 ENCOUNTER — Ambulatory Visit: Admitting: Pulmonary Disease

## 2024-01-01 NOTE — Telephone Encounter (Signed)
 error

## 2024-01-03 ENCOUNTER — Telehealth: Admitting: Physician Assistant

## 2024-01-03 DIAGNOSIS — L02215 Cutaneous abscess of perineum: Secondary | ICD-10-CM

## 2024-01-03 DIAGNOSIS — N824 Other female intestinal-genital tract fistulae: Secondary | ICD-10-CM

## 2024-01-03 MED ORDER — DOXYCYCLINE HYCLATE 100 MG PO TABS: 100.0000 mg | ORAL_TABLET | Freq: Two times a day (BID) | ORAL | 0 refills | Status: DC

## 2024-01-03 NOTE — Progress Notes (Signed)
 E Visit for Skin Abscess  We are sorry that you are not feeling well. Here is how we plan to help!  Based on what you shared with me it looks like you have skin abscess.  I have prescribed:  Doxycycline  100mg  Take 1 tablet twice daily for 7 days  HOME CARE:  Take your medications as ordered and take all of them, even if the skin irritation appears to be healing.   GET HELP RIGHT AWAY IF:  Symptoms that don't begin to go away within 48 hours. Severe redness persists or worsens If the area turns color, spreads or swells. If it blisters and opens, develops yellow-brown crust or bleeds. You develop a fever or chills. If the pain increases or becomes unbearable.  Are unable to keep fluids and food down.  MAKE SURE YOU   Understand these instructions. Will watch your condition. Will get help right away if you are not doing well or get worse.  Thank you for choosing an e-visit. Your e-visit answers were reviewed by a board certified advanced clinical practitioner to complete your personal care plan. Depending upon the condition, your plan could have included both over the counter or prescription medications. Please review your pharmacy choice. Make sure the pharmacy is open so you can pick up prescription now. If there is a problem, you may contact your provider through [employer] and have the prescription routed to another pharmacy. Your safety is important to us . If you have drug allergies check your prescription carefully.  For the next 24 hours you can use MyChart to ask questions about today's visit, request a non-urgent call back, or ask for a work or school excuse. You will get an email in the next two days asking about your experience. I hope that your e-visit has been valuable and will speed your recovery.   I have spent 5 minutes in review of e-visit questionnaire, review and updating patient chart, medical decision making and response to patient.   [name] CHRISTELLA Dickinson, PA-C

## 2024-01-09 ENCOUNTER — Ambulatory Visit (INDEPENDENT_AMBULATORY_CARE_PROVIDER_SITE_OTHER): Admitting: Internal Medicine

## 2024-01-09 ENCOUNTER — Encounter: Payer: Self-pay | Admitting: Internal Medicine

## 2024-01-09 VITALS — BP 138/86 | HR 107 | Temp 98.4°F | Ht 61.0 in | Wt 134.8 lb

## 2024-01-09 DIAGNOSIS — E538 Deficiency of other specified B group vitamins: Secondary | ICD-10-CM | POA: Diagnosis not present

## 2024-01-09 DIAGNOSIS — K50013 Crohn's disease of small intestine with fistula: Secondary | ICD-10-CM | POA: Diagnosis not present

## 2024-01-09 DIAGNOSIS — N2889 Other specified disorders of kidney and ureter: Secondary | ICD-10-CM | POA: Diagnosis not present

## 2024-01-09 DIAGNOSIS — I422 Other hypertrophic cardiomyopathy: Secondary | ICD-10-CM | POA: Diagnosis not present

## 2024-01-09 DIAGNOSIS — J449 Chronic obstructive pulmonary disease, unspecified: Secondary | ICD-10-CM | POA: Diagnosis not present

## 2024-01-09 DIAGNOSIS — R932 Abnormal findings on diagnostic imaging of liver and biliary tract: Secondary | ICD-10-CM

## 2024-01-09 DIAGNOSIS — J0100 Acute maxillary sinusitis, unspecified: Secondary | ICD-10-CM

## 2024-01-09 MED ORDER — HYDROCODONE-ACETAMINOPHEN 10-325 MG PO TABS: 1.0000 | ORAL_TABLET | Freq: Three times a day (TID) | ORAL | 0 refills | Status: DC | PRN

## 2024-01-09 MED ORDER — AZITHROMYCIN 250 MG PO TABS: ORAL_TABLET | ORAL | 0 refills | Status: DC

## 2024-01-09 NOTE — Assessment & Plan Note (Signed)
Start a Zpack 

## 2024-01-09 NOTE — Assessment & Plan Note (Signed)
 Pt wants a second opinion - will arrange at [facility] 

## 2024-01-09 NOTE — Assessment & Plan Note (Signed)
F/u w/GI 

## 2024-01-09 NOTE — Assessment & Plan Note (Signed)
 On B12

## 2024-01-09 NOTE — Progress Notes (Signed)
 Subjective:  Patient ID: [name] [name], female    DOB: Jan 26, 1971  Age: 53 y.o. MRN: [id number]  CC: Medical Management of Chronic Issues (2 Month follow up. Continued concerns with heart)   HPI [name] [name] presents for ?ILD, HCM, HTN, ?liver cirrhosis on chest CT   Outpatient Medications Prior to Visit  Medication Sig Dispense Refill   ALPRAZolam  (XANAX ) 1 MG tablet Take 1 tablet (1 mg total) by mouth 2 (two) times daily. 30 tablet 2   benazepril  (LOTENSIN ) 20 MG tablet TAKE 1 TABLET BY MOUTH EVERY DAY 90 tablet 3   cyanocobalamin  (VITAMIN B12) 1000 MCG/ML injection INJECT 1ML INTO THE SKIN EVERY 14 DAYS 6 mL 5   cyclobenzaprine  (FLEXERIL ) 10 MG tablet Take 1 tablet (10 mg total) by mouth at bedtime. 30 tablet 5   doxycycline  (VIBRA -TABS) 100 MG tablet Take 1 tablet (100 mg total) by mouth 2 (two) times daily. 14 tablet 0   norethindrone  (AYGESTIN ) 5 MG tablet TAKE 1 TABLET (5 MG TOTAL) BY MOUTH DAILY. 90 tablet 2   pantoprazole  (PROTONIX ) 40 MG tablet Take 1 tablet (40 mg total) by mouth daily. TAKE 1 TABLET BY MOUTH EVERY DAY IN THE MORNING 90 tablet 3   potassium chloride  (KLOR-CON ) 10 MEQ tablet TAKE 1 TABLET BY MOUTH EVERY DAY 90 tablet 3   sertraline  (ZOLOFT ) 100 MG tablet Take 200 mg by mouth daily. (Patient taking differently: Take 250 mg by mouth daily.)     SYRINGE-NEEDLE, DISP, 3 ML (B-D SYRINGE/NEEDLE 3CC/25GX5/8) 25G X 5/8 3 ML MISC Use to administer B12 injection every 14 days 24 each 0   VITAMIN D  PO Take by mouth. 5,000 units daily.     HYDROcodone -acetaminophen  (NORCO) 10-325 MG tablet Take 1 tablet by mouth 3 (three) times daily as needed for severe pain. 90 tablet 0   HYDROcodone -acetaminophen  (NORCO) 10-325 MG tablet Take 1 tablet by mouth 3 times daily as needed for severe pain. 90 tablet 0   No facility-administered medications prior to visit.    ROS: Review of Systems  Constitutional:  Positive for fatigue. Negative for activity change, appetite change,  chills and unexpected weight change.  HENT:  Negative for congestion, mouth sores and sinus pressure.   Eyes:  Negative for visual disturbance.  Respiratory:  Negative for cough and chest tightness.   Gastrointestinal:  Negative for abdominal pain and nausea.  Genitourinary:  Negative for difficulty urinating, frequency and vaginal pain.  Musculoskeletal:  Positive for back pain. Negative for gait problem.  Skin:  Negative for pallor and rash.  Neurological:  Negative for dizziness, tremors, weakness, numbness and headaches.  Psychiatric/Behavioral:  Negative for confusion, sleep disturbance and suicidal ideas.     Objective:  BP 138/86   Pulse (!) 107   Temp 98.4 F (36.9 C)   Ht 5' 1 (1.549 m)   Wt 134 lb 12.8 oz (61.1 kg)   SpO2 97%   BMI 25.47 kg/m   BP Readings from Last 3 Encounters:  01/09/24 138/86  12/12/23 122/70  11/19/23 126/83    Wt Readings from Last 3 Encounters:  01/09/24 134 lb 12.8 oz (61.1 kg)  12/12/23 132 lb (59.9 kg)  11/19/23 131 lb 12.8 oz (59.8 kg)    Physical Exam Constitutional:      General: She is not in acute distress.    Appearance: She is well-developed.  HENT:     Head: Normocephalic.     Right Ear: External ear normal.  Left Ear: External ear normal.     Nose: Nose normal.  Eyes:     General:        Right eye: No discharge.        Left eye: No discharge.     Conjunctiva/sclera: Conjunctivae normal.     Pupils: Pupils are equal, round, and reactive to light.  Neck:     Thyroid : No thyromegaly.     Vascular: No JVD.     Trachea: No tracheal deviation.  Cardiovascular:     Rate and Rhythm: Normal rate and regular rhythm.     Heart sounds: Normal heart sounds.  Pulmonary:     Effort: No respiratory distress.     Breath sounds: No stridor. No wheezing.  Abdominal:     General: Bowel sounds are normal. There is no distension.     Palpations: Abdomen is soft. There is no mass.     Tenderness: There is no abdominal  tenderness. There is no guarding or rebound.  Musculoskeletal:        General: No tenderness.     Cervical back: Normal range of motion and neck supple. No rigidity.  Lymphadenopathy:     Cervical: No cervical adenopathy.  Skin:    Findings: No erythema or rash.  Neurological:     Cranial Nerves: No cranial nerve deficit.     Motor: No abnormal muscle tone.     Coordination: Coordination normal.     Deep Tendon Reflexes: Reflexes normal.  Psychiatric:        Behavior: Behavior normal.        Thought Content: Thought content normal.        Judgment: Judgment normal.     Lab Results  Component Value Date   WBC 8.3 08/21/2023   HGB 14.2 08/21/2023   HCT 41.8 08/21/2023   PLT 123.0 (L) 08/21/2023   GLUCOSE 85 11/19/2023   CHOL 221 (H) 08/28/2022   TRIG 210.0 (H) 08/28/2022   HDL 31.00 (L) 08/28/2022   LDLDIRECT 169.0 08/28/2022   LDLCALC 121 (H) 08/30/2014   ALT 9 11/19/2023   AST 14 11/19/2023   NA 143 11/19/2023   K 3.7 11/19/2023   CL 104 11/19/2023   CREATININE 1.57 (H) 11/19/2023   BUN 22 11/19/2023   CO2 22 11/19/2023   TSH 1.469 01/03/2023   INR 1.1 12/05/2020   HGBA1C 4.9 10/11/2021    CT CHEST HIGH RESOLUTION Result Date: 11/22/2023 CLINICAL DATA:  Interstitial lung disease. EXAM: CT CHEST WITHOUT CONTRAST TECHNIQUE: Multidetector CT imaging of the chest was performed following the standard protocol without intravenous contrast. High resolution imaging of the lungs, as well as inspiratory and expiratory imaging, was performed. RADIATION DOSE REDUCTION: This exam was performed according to the departmental dose-optimization program which includes automated exposure control, adjustment of the mA and/or kV according to patient size and/or use of iterative reconstruction technique. COMPARISON:  12/10/2018. FINDINGS: Cardiovascular: Atherosclerotic calcification of the aorta with age advanced involvement of the circumflex and right coronary arteries. Heart size normal.  No pericardial effusion. Mediastinum/Nodes: No pathologically enlarged mediastinal or axillary lymph nodes. Hilar regions are difficult to definitively evaluate without IV contrast. Esophagus is grossly unremarkable. Lungs/Pleura: Negative for subpleural reticulation, traction bronchiectasis/bronchiolectasis, ground glass, architectural distortion or honeycombing. Smoking related respiratory bronchiolitis. Mild emphysema. 3 mm lingular nodule (5/91). 4 mm medial left lower lobe nodule (5/110). Comparison with the prior exam is challenging due to pulmonary edema study. No pleural fluid. Minimal debris in the airway.  No air trapping. Upper Abdomen: Liver margin is slightly irregular. Spleen appears enlarged but is incompletely imaged. Visualized portions of the liver, gallbladder, adrenal glands, kidneys, spleen, pancreas, stomach and bowel are otherwise grossly unremarkable. No upper abdominal adenopathy. Musculoskeletal: Degenerative changes in the spine. IMPRESSION: 1. No evidence of interstitial lung disease or air trapping. 2.  Age advanced two vessel coronary artery calcification. 3. Pulmonary nodules measure 4 mm or less in size. No follow-up needed if patient is low-risk (and has no known or suspected primary neoplasm). Non-contrast chest CT can be considered in 12 months if patient is high-risk. This recommendation follows the consensus statement: Guidelines for Management of Incidental Pulmonary Nodules Detected on CT Images: From the Fleischner Society 2017; Radiology 2017; 284:228-243. Alternatively, Low-dose CT lung cancer screening is recommended for patients who are 67-65 years of age with a 20+ pack-year history of smoking and who are currently smoking or quit <=15 years ago. 4. Liver appears cirrhotic.  Splenomegaly. 5.  Aortic atherosclerosis (ICD10-I70.0). 6.  Emphysema (ICD10-J43.9). Electronically Signed   By: Newell Eke M.D.   On: 11/22/2023 12:24    Assessment & Plan:   Problem List  Items Addressed This Visit     Abnormal liver CT   R/o nonalcoholic cirrhosis: Chest CT - Liver appears cirrhotic.  Splenomegaly.  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    Dispense:  90 tablet    Refill:  0    Please fill on or after 01/15/24. Code: M54.50   HYDROcodone -acetaminophen  (NORCO) 10-325 MG tablet    Sig: Take 1 tablet by mouth 3 times daily as needed for severe pain.    Dispense:  90 tablet    Refill:  0    Please fill on or after 02/14/2024. Code: M54.50      Follow-up: Return in about 2 months (around 03/10/2024) for a follow-up visit.  Marolyn Noel, MD

## 2024-01-09 NOTE — Assessment & Plan Note (Signed)
 GFR 36 No NSAIDs Monitoring GFR Hydrate well

## 2024-01-09 NOTE — Assessment & Plan Note (Signed)
 F/u w/Dr Alghanim  No ILD on CT (11/2023)

## 2024-01-09 NOTE — Assessment & Plan Note (Signed)
 R/o nonalcoholic cirrhosis: Chest CT - Liver appears cirrhotic.  Splenomegaly.  F/u w/Dr carver

## 2024-01-20 ENCOUNTER — Ambulatory Visit: Admitting: Physician Assistant

## 2024-02-10 ENCOUNTER — Ambulatory Visit: Admitting: Cardiology

## 2024-02-28 ENCOUNTER — Telehealth: Payer: Self-pay

## 2024-02-28 NOTE — Telephone Encounter (Signed)
 Pt has left several messages wanting a Rx for Toradol . She states you gave it to her before. This pt is also has another Dr giving her Hydrocodone  on a regular. Her last visit was on 08/14/2023. Please advise.  (Sent to Dr Cindie in secure chat / waiting on a response)

## 2024-03-02 ENCOUNTER — Other Ambulatory Visit: Payer: Self-pay | Admitting: Internal Medicine

## 2024-03-03 ENCOUNTER — Other Ambulatory Visit: Payer: Self-pay | Admitting: Internal Medicine

## 2024-03-03 DIAGNOSIS — K50013 Crohn's disease of small intestine with fistula: Secondary | ICD-10-CM

## 2024-03-03 DIAGNOSIS — R197 Diarrhea, unspecified: Secondary | ICD-10-CM

## 2024-03-03 MED ORDER — KETOROLAC TROMETHAMINE 10 MG PO TABS: 10.0000 mg | ORAL_TABLET | Freq: Four times a day (QID) | ORAL | 0 refills | Status: AC | PRN

## 2024-03-03 NOTE — Telephone Encounter (Signed)
 Noted. Thanks.

## 2024-03-03 NOTE — Telephone Encounter (Signed)
 Pt called back to check on the status of this stating that she is having a crohn's flare and is needing something for the pain.

## 2024-03-03 NOTE — Telephone Encounter (Signed)
 Per Dr. Cindie through secure chat, Looks like her PCP Dr. Garald ordered the Toradol  last February. I can send in a short course to get her to her appt. I am also going to order stool studies to be completed at [facility]. Thank you

## 2024-03-03 NOTE — Telephone Encounter (Signed)
 I spoke with the patient who has a history of Crohn's disease of ileum with fistula, Patient reports she has been having a flare ongoing for the last week. She is not currently on any biologics for her Crohn's. She reports a pain in her rectal area that is so uncomfortable she can not sit down with out pain. Patient called the [address] location and was given an appointment for the first available appointment with [facility], which is not until 03/30/2024, she has asked to be placed on a cancellation list for both offices. Patient says she has multiple loose stools per day (three loose stools per day) denies any dark or bloody stools, fever, or abdominal pain, or mucus in stools. She is wanting to know if you will submit a script for her to [facility] in [place], [place] on [address] for Toradol , which she says you gave her last February 2025 #20. She feels this will get her through until she is able to be seen by us  in the clinic. Patient is currently on Hydrocodone  10-325, which she says she takes half bid usually.    Please advise.

## 2024-03-03 NOTE — Telephone Encounter (Signed)
 [name] [name]  I spoke with the patient and made her aware per Dr. Cindie, He has sent in a short course of Toradol  to get her to her appt. I am also going to order stool studies to be completed at [facility]. Patient states understanding, and patient would like to cancel the upcoming appointment with Sonny on 03/30/2024 and be put on the schedule with Dr. Cindie only, and placed on a cancellation list also to see Dr. Cindie sooner.

## 2024-03-09 ENCOUNTER — Ambulatory Visit: Admitting: Internal Medicine

## 2024-03-09 VITALS — BP 142/88 | HR 78 | Ht 61.0 in | Wt 132.6 lb

## 2024-03-09 DIAGNOSIS — F4321 Adjustment disorder with depressed mood: Secondary | ICD-10-CM

## 2024-03-09 DIAGNOSIS — M2559 Pain in other specified joint: Secondary | ICD-10-CM | POA: Diagnosis not present

## 2024-03-09 DIAGNOSIS — G8929 Other chronic pain: Secondary | ICD-10-CM | POA: Diagnosis not present

## 2024-03-09 DIAGNOSIS — K5010 Crohn's disease of large intestine without complications: Secondary | ICD-10-CM

## 2024-03-09 DIAGNOSIS — E538 Deficiency of other specified B group vitamins: Secondary | ICD-10-CM | POA: Diagnosis not present

## 2024-03-09 DIAGNOSIS — E559 Vitamin D deficiency, unspecified: Secondary | ICD-10-CM

## 2024-03-09 DIAGNOSIS — F17200 Nicotine dependence, unspecified, uncomplicated: Secondary | ICD-10-CM

## 2024-03-09 DIAGNOSIS — M5440 Lumbago with sciatica, unspecified side: Secondary | ICD-10-CM

## 2024-03-09 DIAGNOSIS — F419 Anxiety disorder, unspecified: Secondary | ICD-10-CM | POA: Diagnosis not present

## 2024-03-09 LAB — CBC WITH DIFFERENTIAL/PLATELET
Basophils Absolute: 0 K/uL (ref 0.0–0.1)
Basophils Relative: 0.4 % (ref 0.0–3.0)
Eosinophils Absolute: 0.1 K/uL (ref 0.0–0.7)
Eosinophils Relative: 0.6 % (ref 0.0–5.0)
HCT: 38.7 % (ref 36.0–46.0)
Hemoglobin: 13.6 g/dL (ref 12.0–15.0)
Lymphocytes Relative: 13.9 % (ref 12.0–46.0)
Lymphs Abs: 1.1 K/uL (ref 0.7–4.0)
MCHC: 35.1 g/dL (ref 30.0–36.0)
MCV: 85.4 fl (ref 78.0–100.0)
Monocytes Absolute: 0.3 K/uL (ref 0.1–1.0)
Monocytes Relative: 3.7 % (ref 3.0–12.0)
Neutro Abs: 6.7 K/uL (ref 1.4–7.7)
Neutrophils Relative %: 81.4 % — ABNORMAL HIGH (ref 43.0–77.0)
Platelets: 116 K/uL — ABNORMAL LOW (ref 150.0–400.0)
RBC: 4.53 Mil/uL (ref 3.87–5.11)
RDW: 13.7 % (ref 11.5–15.5)
WBC: 8.2 K/uL (ref 4.0–10.5)

## 2024-03-09 LAB — COMPREHENSIVE METABOLIC PANEL WITH GFR
ALT: 6 U/L (ref 3–35)
AST: 11 U/L (ref 5–37)
Albumin: 4.6 g/dL (ref 3.5–5.2)
Alkaline Phosphatase: 53 U/L (ref 39–117)
BUN: 14 mg/dL (ref 6–23)
CO2: 25 meq/L (ref 19–32)
Calcium: 9.2 mg/dL (ref 8.4–10.5)
Chloride: 105 meq/L (ref 96–112)
Creatinine, Ser: 1.28 mg/dL — ABNORMAL HIGH (ref 0.40–1.20)
GFR: 47.58 mL/min — ABNORMAL LOW
Glucose, Bld: 91 mg/dL (ref 70–99)
Potassium: 4 meq/L (ref 3.5–5.1)
Sodium: 139 meq/L (ref 135–145)
Total Bilirubin: 0.6 mg/dL (ref 0.2–1.2)
Total Protein: 6.7 g/dL (ref 6.0–8.3)

## 2024-03-09 LAB — URINALYSIS, ROUTINE W REFLEX MICROSCOPIC
Bilirubin Urine: NEGATIVE
Ketones, ur: NEGATIVE
Nitrite: NEGATIVE
Specific Gravity, Urine: <=1.005 — AB (ref 1.000–1.030)
Total Protein, Urine: NEGATIVE
Urine Glucose: NEGATIVE
Urobilinogen, UA: 0.2 (ref 0.0–1.0)
pH: 6 (ref 5.0–8.0)

## 2024-03-09 LAB — TSH: TSH: 1.91 u[IU]/mL (ref 0.35–5.50)

## 2024-03-09 LAB — URIC ACID: Uric Acid, Serum: 4.7 mg/dL (ref 2.4–7.0)

## 2024-03-09 MED ORDER — HYDROCODONE-ACETAMINOPHEN 10-325 MG PO TABS: 1.0000 | ORAL_TABLET | Freq: Three times a day (TID) | ORAL | 0 refills | Status: AC | PRN

## 2024-03-09 NOTE — Assessment & Plan Note (Signed)
 Chronic and severe Norco tid prn.  Flexeril  prn - needs brand name On disabiity  Norco prn. Potential benefits of a long term opioids use as well as potential risks (i.e. addiction risk, apnea etc) and complications (i.e. Somnolence, constipation and others) were explained to the patient and were aknowledged.

## 2024-03-09 NOTE — Assessment & Plan Note (Signed)
 Off Vit D due to high levels - Summer 2025

## 2024-03-09 NOTE — Assessment & Plan Note (Signed)
 On B12

## 2024-03-09 NOTE — Assessment & Plan Note (Signed)
 Husband died in 5/205 Sad holidays

## 2024-03-09 NOTE — Assessment & Plan Note (Signed)
Chronic Cont on Xanax prn  Potential benefits of a long term benzodiazepines  use as well as potential risks  and complications were explained to the patient and were aknowledged. Do not take w/pain meds. 

## 2024-03-09 NOTE — Assessment & Plan Note (Signed)
 New R foot pain - check uric acid

## 2024-03-09 NOTE — Assessment & Plan Note (Signed)
 Smoking 1/2 ppd. Discussed

## 2024-03-09 NOTE — Progress Notes (Signed)
 "  Subjective:  Patient ID: [name] [name], female    DOB: 12-15-1970  Age: 54 y.o. MRN: [id number]  CC: Medical Management of Chronic Issues (2 Month follow up)   HPI ZASHA BELLEAU presents for LBP, anxiety, HTN C/o toes pain - R foot #1-3 toes  Outpatient Medications Prior to Visit  Medication Sig Dispense Refill   ALPRAZolam  (XANAX ) 1 MG tablet Take 1 tablet (1 mg total) by mouth 2 (two) times daily. 30 tablet 2   benazepril  (LOTENSIN ) 20 MG tablet TAKE 1 TABLET BY MOUTH EVERY DAY 90 tablet 3   cyanocobalamin  (VITAMIN B12) 1000 MCG/ML injection INJECT 1ML INTO THE SKIN EVERY 14 DAYS 6 mL 5   cyclobenzaprine  (FLEXERIL ) 10 MG tablet Take 1 tablet (10 mg total) by mouth at bedtime. 30 tablet 5   ketorolac  (TORADOL ) 10 MG tablet Take 1 tablet (10 mg total) by mouth every 6 (six) hours as needed for severe pain (pain score 7-10). 10 tablet 0   norethindrone  (AYGESTIN ) 5 MG tablet TAKE 1 TABLET (5 MG TOTAL) BY MOUTH DAILY. 90 tablet 2   pantoprazole  (PROTONIX ) 40 MG tablet Take 1 tablet (40 mg total) by mouth daily. TAKE 1 TABLET BY MOUTH EVERY DAY IN THE MORNING 90 tablet 3   potassium chloride  (KLOR-CON ) 10 MEQ tablet TAKE 1 TABLET BY MOUTH EVERY DAY 90 tablet 3   sertraline  (ZOLOFT ) 100 MG tablet Take 200 mg by mouth daily. (Patient taking differently: Take 250 mg by mouth daily.)     SYRINGE-NEEDLE, DISP, 3 ML (B-D SYRINGE/NEEDLE 3CC/25GX5/8) 25G X 5/8 3 ML MISC Use to administer B12 injection every 14 days 24 each 0   VITAMIN D  PO Take by mouth. 5,000 units daily.     HYDROcodone -acetaminophen  (NORCO) 10-325 MG tablet Take 1 tablet by mouth 3 (three) times daily as needed for severe pain. 90 tablet 0   HYDROcodone -acetaminophen  (NORCO) 10-325 MG tablet Take 1 tablet by mouth 3 times daily as needed for severe pain. 90 tablet 0   azithromycin  (ZITHROMAX  Z-PAK) 250 MG tablet As directed 6 tablet 0   doxycycline  (VIBRA -TABS) 100 MG tablet Take 1 tablet (100 mg total) by mouth 2 (two) times  daily. 14 tablet 0   No facility-administered medications prior to visit.    ROS: Review of Systems  Constitutional:  Positive for fatigue. Negative for activity change, appetite change, chills and unexpected weight change.  HENT:  Negative for congestion, mouth sores and sinus pressure.   Eyes:  Negative for visual disturbance.  Respiratory:  Negative for cough and chest tightness.   Gastrointestinal:  Positive for diarrhea. Negative for abdominal pain and nausea.  Genitourinary:  Negative for difficulty urinating, frequency and vaginal pain.  Musculoskeletal:  Positive for arthralgias and back pain. Negative for gait problem.  Skin:  Negative for pallor and rash.  Neurological:  Negative for dizziness, tremors, weakness, numbness and headaches.  Psychiatric/Behavioral:  Negative for confusion and sleep disturbance.     Objective:  BP (!) 142/88   Pulse 78   Ht 5' 1 (1.549 m)   Wt 132 lb 9.6 oz (60.1 kg)   SpO2 97%   BMI 25.05 kg/m   BP Readings from Last 3 Encounters:  03/09/24 (!) 142/88  01/09/24 138/86  12/12/23 122/70    Wt Readings from Last 3 Encounters:  03/09/24 132 lb 9.6 oz (60.1 kg)  01/09/24 134 lb 12.8 oz (61.1 kg)  12/12/23 132 lb (59.9 kg)    Physical Exam Constitutional:  General: She is not in acute distress.    Appearance: Normal appearance. She is well-developed.  HENT:     Head: Normocephalic.     Right Ear: External ear normal.     Left Ear: External ear normal.     Nose: Nose normal.  Eyes:     General:        Right eye: No discharge.        Left eye: No discharge.     Conjunctiva/sclera: Conjunctivae normal.     Pupils: Pupils are equal, round, and reactive to light.  Neck:     Thyroid : No thyromegaly.     Vascular: No JVD.     Trachea: No tracheal deviation.  Cardiovascular:     Rate and Rhythm: Normal rate and regular rhythm.     Heart sounds: Normal heart sounds.  Pulmonary:     Effort: No respiratory distress.      Breath sounds: No stridor. No wheezing.  Abdominal:     General: Bowel sounds are normal. There is no distension.     Palpations: Abdomen is soft. There is no mass.     Tenderness: There is no abdominal tenderness. There is no guarding or rebound.  Musculoskeletal:        General: No tenderness.     Cervical back: Normal range of motion and neck supple. No rigidity.  Lymphadenopathy:     Cervical: No cervical adenopathy.  Skin:    Findings: No erythema or rash.  Neurological:     Mental Status: She is oriented to person, place, and time.     Cranial Nerves: No cranial nerve deficit.     Motor: No abnormal muscle tone.     Coordination: Coordination normal.     Deep Tendon Reflexes: Reflexes normal.  Psychiatric:        Behavior: Behavior normal.        Thought Content: Thought content normal.        Judgment: Judgment normal.     Lab Results  Component Value Date   WBC 8.3 08/21/2023   HGB 14.2 08/21/2023   HCT 41.8 08/21/2023   PLT 123.0 (L) 08/21/2023   GLUCOSE 85 11/19/2023   CHOL 221 (H) 08/28/2022   TRIG 210.0 (H) 08/28/2022   HDL 31.00 (L) 08/28/2022   LDLDIRECT 169.0 08/28/2022   LDLCALC 121 (H) 08/30/2014   ALT 9 11/19/2023   AST 14 11/19/2023   NA 143 11/19/2023   K 3.7 11/19/2023   CL 104 11/19/2023   CREATININE 1.57 (H) 11/19/2023   BUN 22 11/19/2023   CO2 22 11/19/2023   TSH 1.469 01/03/2023   INR 1.1 12/05/2020   HGBA1C 4.9 10/11/2021    CT CHEST HIGH RESOLUTION Result Date: 11/22/2023 CLINICAL DATA:  Interstitial lung disease. EXAM: CT CHEST WITHOUT CONTRAST TECHNIQUE: Multidetector CT imaging of the chest was performed following the standard protocol without intravenous contrast. High resolution imaging of the lungs, as well as inspiratory and expiratory imaging, was performed. RADIATION DOSE REDUCTION: This exam was performed according to the departmental dose-optimization program which includes automated exposure control, adjustment of the mA and/or  kV according to patient size and/or use of iterative reconstruction technique. COMPARISON:  12/10/2018. FINDINGS: Cardiovascular: Atherosclerotic calcification of the aorta with age advanced involvement of the circumflex and right coronary arteries. Heart size normal. No pericardial effusion. Mediastinum/Nodes: No pathologically enlarged mediastinal or axillary lymph nodes. Hilar regions are difficult to definitively evaluate without IV contrast. Esophagus is grossly unremarkable. Lungs/Pleura:  Negative for subpleural reticulation, traction bronchiectasis/bronchiolectasis, ground glass, architectural distortion or honeycombing. Smoking related respiratory bronchiolitis. Mild emphysema. 3 mm lingular nodule 06-20-89). 4 mm medial left lower lobe nodule (5/110). Comparison with the prior exam is challenging due to pulmonary edema study. No pleural fluid. Minimal debris in the airway. No air trapping. Upper Abdomen: Liver margin is slightly irregular. Spleen appears enlarged but is incompletely imaged. Visualized portions of the liver, gallbladder, adrenal glands, kidneys, spleen, pancreas, stomach and bowel are otherwise grossly unremarkable. No upper abdominal adenopathy. Musculoskeletal: Degenerative changes in the spine. IMPRESSION: 1. No evidence of interstitial lung disease or air trapping. 2.  Age advanced two vessel coronary artery calcification. 3. Pulmonary nodules measure 4 mm or less in size. No follow-up needed if patient is low-risk (and has no known or suspected primary neoplasm). Non-contrast chest CT can be considered in 12 months if patient is high-risk. This recommendation follows the consensus statement: Guidelines for Management of Incidental Pulmonary Nodules Detected on CT Images: From the Fleischner Society 2017; Radiology 2017; 284:228-243. Alternatively, Low-dose CT lung cancer screening is recommended for patients who are 77-68 years of age with a 20+ pack-year history of smoking and who are  currently smoking or quit <=15 years ago. 4. Liver appears cirrhotic.  Splenomegaly. 5.  Aortic atherosclerosis (ICD10-I70.0). 6.  Emphysema (ICD10-J43.9). Electronically Signed   By: Newell Eke M.D.   On: 11/22/2023 12:24    Assessment & Plan:   Problem List Items Addressed This Visit     B12 deficiency   On B12      Anxiety   Chronic Cont on Xanax  prn  Potential benefits of a long term benzodiazepines  use as well as potential risks  and complications were explained to the patient and were aknowledged. Do not take w/pain meds.      TOBACCO USER   Smoking 1/2 ppd. Discussed      Arthralgia   New R foot pain - check uric acid      Low back pain - Primary   Chronic and severe Norco tid prn.  Flexeril  prn - needs brand name On disabiity  Norco prn. Potential benefits of a long term opioids use as well as potential risks (i.e. addiction risk, apnea etc) and complications (i.e. Somnolence, constipation and others) were explained to the patient and were aknowledged.      Relevant Medications   HYDROcodone -acetaminophen  (NORCO) 10-325 MG tablet   HYDROcodone -acetaminophen  (NORCO) 10-325 MG tablet   Vitamin D  deficiency   Off Vit D due to high levels - Summer 2025      Crohn's disease of anal canal (HCC)   F/u w/GI Labs - CBC, CMET      Grief   Husband died in 5/205 Sad holidays          Meds ordered this encounter  Medications   HYDROcodone -acetaminophen  (NORCO) 10-325 MG tablet    Sig: Take 1 tablet by mouth 3 (three) times daily as needed for severe pain.    Dispense:  90 tablet    Refill:  0    Please fill on or after 03/14/24. Code: M54.50   HYDROcodone -acetaminophen  (NORCO) 10-325 MG tablet    Sig: Take 1 tablet by mouth 3 times daily as needed for severe pain.    Dispense:  90 tablet    Refill:  0    Please fill on or after 04/13/24. Code: M54.50      Follow-up: No follow-ups on file.  Marolyn Noel, MD "

## 2024-03-09 NOTE — Assessment & Plan Note (Signed)
 F/u w/GI Labs - CBC, CMET

## 2024-03-11 ENCOUNTER — Other Ambulatory Visit: Payer: Self-pay

## 2024-03-11 LAB — GI PROFILE, STOOL, PCR

## 2024-03-11 LAB — CALPROTECTIN, FECAL: Calprotectin, Fecal: 88 ug/g (ref 0–120)

## 2024-03-13 ENCOUNTER — Ambulatory Visit: Payer: Self-pay | Admitting: Internal Medicine

## 2024-03-20 ENCOUNTER — Ambulatory Visit: Admitting: Adult Health

## 2024-03-20 ENCOUNTER — Encounter (INDEPENDENT_AMBULATORY_CARE_PROVIDER_SITE_OTHER): Payer: Self-pay

## 2024-03-20 ENCOUNTER — Other Ambulatory Visit (INDEPENDENT_AMBULATORY_CARE_PROVIDER_SITE_OTHER): Payer: Self-pay

## 2024-03-20 DIAGNOSIS — K50013 Crohn's disease of small intestine with fistula: Secondary | ICD-10-CM

## 2024-03-23 ENCOUNTER — Encounter (INDEPENDENT_AMBULATORY_CARE_PROVIDER_SITE_OTHER): Payer: Self-pay

## 2024-03-26 ENCOUNTER — Ambulatory Visit: Admitting: Internal Medicine

## 2024-03-30 ENCOUNTER — Ambulatory Visit: Admitting: Gastroenterology

## 2024-05-07 ENCOUNTER — Ambulatory Visit: Admitting: Internal Medicine

## 2024-05-18 ENCOUNTER — Ambulatory Visit: Admitting: Adult Health

## 2024-05-27 ENCOUNTER — Ambulatory Visit: Admitting: Pulmonary Disease

## 2024-06-09 ENCOUNTER — Ambulatory Visit: Admitting: Nurse Practitioner

## 2024-06-15 ENCOUNTER — Other Ambulatory Visit

## 2024-06-22 ENCOUNTER — Ambulatory Visit: Admitting: Physician Assistant

## 2024-10-23 ENCOUNTER — Ambulatory Visit
